# Patient Record
Sex: Male | Born: 1945
Health system: Southern US, Community
[De-identification: ages and names within clinical notes are randomized; demographics above are authoritative.]

## PROBLEM LIST (undated history)

## (undated) DIAGNOSIS — N529 Male erectile dysfunction, unspecified: Secondary | ICD-10-CM

## (undated) DIAGNOSIS — D472 Monoclonal gammopathy: Secondary | ICD-10-CM

## (undated) DIAGNOSIS — R7301 Impaired fasting glucose: Secondary | ICD-10-CM

## (undated) DIAGNOSIS — K219 Gastro-esophageal reflux disease without esophagitis: Secondary | ICD-10-CM

## (undated) DIAGNOSIS — G629 Polyneuropathy, unspecified: Secondary | ICD-10-CM

## (undated) DIAGNOSIS — D539 Nutritional anemia, unspecified: Secondary | ICD-10-CM

## (undated) DIAGNOSIS — E669 Obesity, unspecified: Secondary | ICD-10-CM

## (undated) DIAGNOSIS — I2510 Atherosclerotic heart disease of native coronary artery without angina pectoris: Secondary | ICD-10-CM

## (undated) DIAGNOSIS — G63 Polyneuropathy in diseases classified elsewhere: Secondary | ICD-10-CM

## (undated) DIAGNOSIS — M4800 Spinal stenosis, site unspecified: Secondary | ICD-10-CM

## (undated) DIAGNOSIS — E781 Pure hyperglyceridemia: Secondary | ICD-10-CM

## (undated) DIAGNOSIS — H269 Unspecified cataract: Secondary | ICD-10-CM

## (undated) DIAGNOSIS — H409 Unspecified glaucoma: Secondary | ICD-10-CM

## (undated) DIAGNOSIS — E041 Nontoxic single thyroid nodule: Secondary | ICD-10-CM

## (undated) DIAGNOSIS — M1990 Unspecified osteoarthritis, unspecified site: Secondary | ICD-10-CM

## (undated) DIAGNOSIS — G2581 Restless legs syndrome: Secondary | ICD-10-CM

## (undated) HISTORY — DX: Polyneuropathy in diseases classified elsewhere: G63

## (undated) HISTORY — DX: Male erectile dysfunction, unspecified: N52.9

## (undated) HISTORY — DX: Spinal stenosis, site unspecified: M48.00

## (undated) HISTORY — DX: Nontoxic single thyroid nodule: E04.1

## (undated) HISTORY — DX: Unspecified glaucoma: H40.9

## (undated) HISTORY — DX: Nutritional anemia, unspecified: D53.9

## (undated) HISTORY — PX: CARDIAC CATHETERIZATION: SHX172

## (undated) HISTORY — PX: COLON SURGERY: SHX602

## (undated) HISTORY — DX: Restless legs syndrome: G25.81

## (undated) HISTORY — DX: Obesity, unspecified: E66.9

## (undated) HISTORY — PX: SMALL INTESTINE SURGERY: SHX150

## (undated) HISTORY — PX: KNEE SURGERY: SHX244

## (undated) HISTORY — DX: Polyneuropathy, unspecified: G62.9

## (undated) HISTORY — DX: Monoclonal gammopathy: D47.2

## (undated) HISTORY — DX: Unspecified cataract: H26.9

## (undated) HISTORY — DX: Unspecified osteoarthritis, unspecified site: M19.90

## (undated) HISTORY — DX: Pure hyperglyceridemia: E78.1

## (undated) HISTORY — DX: Gastro-esophageal reflux disease without esophagitis: K21.9

## (undated) HISTORY — DX: Impaired fasting glucose: R73.01

## (undated) HISTORY — PX: COLONOSCOPY: SHX174

---

## 2002-07-10 ENCOUNTER — Encounter: Payer: Self-pay | Admitting: Internal Medicine

## 2002-07-10 ENCOUNTER — Ambulatory Visit (HOSPITAL_COMMUNITY): Admission: RE | Admit: 2002-07-10 | Discharge: 2002-07-10 | Payer: Self-pay | Admitting: Gastroenterology

## 2002-07-10 ENCOUNTER — Encounter (INDEPENDENT_AMBULATORY_CARE_PROVIDER_SITE_OTHER): Payer: Self-pay

## 2002-07-21 ENCOUNTER — Encounter: Admission: RE | Admit: 2002-07-21 | Discharge: 2002-07-21 | Payer: Self-pay | Admitting: Internal Medicine

## 2002-07-21 ENCOUNTER — Encounter: Payer: Self-pay | Admitting: Internal Medicine

## 2006-10-07 ENCOUNTER — Encounter: Admission: RE | Admit: 2006-10-07 | Discharge: 2006-10-07 | Payer: Self-pay | Admitting: Internal Medicine

## 2006-10-18 ENCOUNTER — Encounter: Admission: RE | Admit: 2006-10-18 | Discharge: 2006-10-18 | Payer: Self-pay | Admitting: Internal Medicine

## 2006-10-21 ENCOUNTER — Encounter (INDEPENDENT_AMBULATORY_CARE_PROVIDER_SITE_OTHER): Payer: Self-pay | Admitting: Interventional Radiology

## 2006-10-21 ENCOUNTER — Encounter: Admission: RE | Admit: 2006-10-21 | Discharge: 2006-10-21 | Payer: Self-pay | Admitting: Internal Medicine

## 2006-10-21 ENCOUNTER — Other Ambulatory Visit: Admission: RE | Admit: 2006-10-21 | Discharge: 2006-10-21 | Payer: Self-pay | Admitting: Interventional Radiology

## 2007-07-05 ENCOUNTER — Encounter: Admission: RE | Admit: 2007-07-05 | Discharge: 2007-07-05 | Payer: Self-pay | Admitting: Internal Medicine

## 2008-07-12 ENCOUNTER — Encounter: Admission: RE | Admit: 2008-07-12 | Discharge: 2008-07-12 | Payer: Self-pay | Admitting: Internal Medicine

## 2009-01-11 ENCOUNTER — Encounter: Admission: RE | Admit: 2009-01-11 | Discharge: 2009-01-11 | Payer: Self-pay | Admitting: Internal Medicine

## 2009-08-14 ENCOUNTER — Encounter (INDEPENDENT_AMBULATORY_CARE_PROVIDER_SITE_OTHER): Payer: Self-pay | Admitting: *Deleted

## 2009-08-14 ENCOUNTER — Encounter: Payer: Self-pay | Admitting: Internal Medicine

## 2009-08-20 ENCOUNTER — Encounter: Admission: RE | Admit: 2009-08-20 | Discharge: 2009-08-20 | Payer: Self-pay | Admitting: Family Medicine

## 2009-09-12 ENCOUNTER — Ambulatory Visit: Payer: Self-pay | Admitting: Internal Medicine

## 2010-07-01 NOTE — Miscellaneous (Signed)
Summary: anusol rx.  Clinical Lists Changes  Medications: Added new medication of ANUSOL-HC 25 MG  SUPP (HYDROCORTISONE ACETATE) insert 1 supp. every night. - Signed Rx of ANUSOL-HC 25 MG  SUPP (HYDROCORTISONE ACETATE) insert 1 supp. every night.;  #12 x 0;  Signed;  Entered by: Darlyn Read RN;  Authorized by: Hart Carwin MD;  Method used: Electronically to [facility]*, [address] [place], [place]  [address], Ph: [phone] or [phone], Fax: [phone]    Prescriptions: ANUSOL-HC 25 MG  SUPP (HYDROCORTISONE ACETATE) insert 1 supp. every night.  #12 x 0   Entered by:   Darlyn Read RN   Authorized by:   Hart Carwin MD   Signed by:   Darlyn Read RN on 09/12/2009   Method used:   Electronically to        [facility]* (retail)       [address] [place], [place]  [address]       Ph: [phone] or [phone]       Fax: [phone]   RxID:   [id number]

## 2010-07-01 NOTE — Letter (Signed)
Summary: [facility]   Imported By: Lanelle Bal 09/17/2009 10:42:07  _____________________________________________________________________  External Attachment:    Type:   Image     Comment:   External Document

## 2010-07-01 NOTE — Letter (Signed)
Summary: Previsit letter  [facility]  [address] [place], [place] [address]   Phone: [phone]  Fax: [phone]       08/14/2009 MRN: [id number]  Daniel Whitaker [address] [place], [place]  [address]  Dear [name],  Welcome to the Gastroenterology Division at [facility].    You are scheduled to see a nurse for your pre-procedure visit on 08/26/2009 at 8:30AM on the 3rd floor at [facility], [address]  We ask that you try to arrive at our office 15 minutes prior to your appointment time to allow for check-in.  Your nurse visit will consist of discussing your medical and surgical history, your immediate family medical history, and your medications.    Please bring a complete list of all your medications or, if you prefer, bring the medication bottles and we will list them.  We will need to be aware of both prescribed and over the counter drugs.  We will need to know exact dosage information as well.  If you are on blood thinners (Coumadin, Plavix, Aggrenox, Ticlid, etc.) please call our office today/prior to your appointment, as we need to consult with your physician about holding your medication.   Please be prepared to read and sign documents such as consent forms, a financial agreement, and acknowledgement forms.  If necessary, and with your consent, a friend or relative is welcome to sit-in on the nurse visit with you.  Please bring your insurance card so that we may make a copy of it.  If your insurance requires a referral to see a specialist, please bring your referral form from your primary care physician.  No co-pay is required for this nurse visit.     If you cannot keep your appointment, please call [phone] to cancel or reschedule prior to your appointment date.  This allows [place] the opportunity to schedule an appointment for another patient in need of care.    Thank you for choosing [facility] for your  medical needs.  We appreciate the opportunity to care for you.  Please visit [place] at our website  to learn more about our practice.                     Sincerely.                                                                                                                   The Gastroenterology Division

## 2010-07-01 NOTE — Procedures (Signed)
Summary: Colonoscopy  Patient: [name] Note: All result statuses are Final unless otherwise noted.  Tests: (1) Colonoscopy (COL)   COL Colonoscopy           DONE (C)     [facility]     [address]     [place], [place]  [address]           COLONOSCOPY PROCEDURE REPORT           PATIENT:  Daniel Whitaker, Daniel Whitaker  MR#:  [id number]     BIRTHDATE:  August 15, 1945, 63 yrs. old  GENDER:  male     ENDOSCOPIST:  Hedwig Morton. Juanda Chance, MD     REF. BY:  [name], M.D.     PROCEDURE DATE:  09/12/2009     PROCEDURE:  Colonoscopy 29562     ASA CLASS:  Class I     INDICATIONS:  Routine Risk Screening colon in 2004 hyperplastic     polyp     MEDICATIONS:   Fentanyl 75 mcg, Versed 6 mg IV           DESCRIPTION OF PROCEDURE:   After the risks benefits and     alternatives of the procedure were thoroughly explained, informed     consent was obtained.  Digital rectal exam was performed and     revealed no rectal masses.   The LB CF-[id number] endoscope     was introduced through the anus and advanced to the cecum, which     was identified by both the appendix and ileocecal valve, without     limitations.  The quality of the prep was good, using MiraLax.     The instrument was then slowly withdrawn as the colon was fully     examined.     <<PROCEDUREIMAGES>>           FINDINGS:  Three polyps were found in the sigmoid colon. 3     diminutive polyps remover from 20 and 60 cm The polyps were     removed using cold biopsy forceps.  This was otherwise a normal     examination of the colon (see image3, image2, and image1).     Internal hemorrhoids were found (see image5, image7, and image6).     Retroflexed views in the rectum revealed no abnormalities.    The     scope was then withdrawn from the patient and the procedure     completed.           COMPLICATIONS:  None     ENDOSCOPIC IMPRESSION:     1) Three polyps in the sigmoid colon     2) Otherwise normal examination     3) Internal  hemorrhoids     RECOMMENDATIONS:     1) Await pathology results     Anusol HC supp, #12, insert 1 qhs     REPEAT EXAM:  In 7 year(s) for.           ______________________________     Hedwig Morton. Juanda Chance, MD           CC:           n.     REVISED:  09/12/2009 02:42 PM     eSIGNED:   Hedwig Morton. Leibish Mcgregor at 09/12/2009 02:42 PM           [name], [name], [id number]  Note: An exclamation mark (!) indicates a result that was not dispersed into the flowsheet. Document Creation Date:  09/12/2009 2:43 PM _______________________________________________________________________  (1) Order result status: Final Collection or observation date-time: 09/12/2009 10:57 Requested date-time:  Receipt date-time:  Reported date-time:  Referring Physician:   Ordering Physician: Lina Sar [phone]) Specimen Source:  Source: Launa Grill Order Number: [id number] Lab site:   Appended Document: Colonoscopy recall colon 10 years  Appended Document: Colonoscopy     Procedures Next Due Date:    Colonoscopy: 08/2019

## 2010-07-01 NOTE — Procedures (Signed)
Summary: Colonoscopy/[facility]   Imported By: Lanelle Bal 09/17/2009 10:40:03  _____________________________________________________________________  External Attachment:    Type:   Image     Comment:   External Document

## 2010-10-17 NOTE — Op Note (Signed)
   NAME:  Daniel Whitaker, Daniel Whitaker NO.:  [id number]   MEDICAL RECORD NO.:  [id number]                   PATIENT TYPE:  AMB   LOCATION:  ENDO                                 FACILITY[facility]  [facility]   PHYSICIAN:  Danise Edge, M.D.                DATE OF BIRTH:  22-Feb-1946   DATE OF PROCEDURE:  07/10/2002  DATE OF DISCHARGE:                                 OPERATIVE REPORT   PROCEDURE:  Colonoscopy with polypectomy.   INDICATIONS:  The patient is a 65 year old male who underwent a health  maintenance flexible proctosigmoidoscopy.  A polyp was discovered in the  rectum at 10 cm from the anal verge and in the sigmoid colon at 35 cm from  the anal verge.   ENDOSCOPIST:  Danise Edge, M.D.   PREMEDICATION:  Versed 7.5 mg, Demerol 100 mg.   ENDOSCOPE:  Olympus adult colonoscope.   DESCRIPTION OF PROCEDURE:  After obtaining informed consent, the patient was  placed in the left lateral decubitus position.  I administered intravenous  Demerol and intravenous Versed to achieve conscious sedation for the  procedure.  The patient's blood pressure, oxygen saturation, and cardiac  rhythm were documented throughout the procedure and documented in the  medical record.   Anal inspection was normal.  Digital rectal exam revealed a non-nodular  prostate.  The Olympus colonoscope was introduced into the rectum and  advanced to the cecum.  Colonic preparation for the exam today was  excellent.   Rectum:  From the distal rectum at 10 cm from the anal verge, a 2 mm sessile  polyp was removed with the electrocautery snare.   Sigmoid colon and descending colon:  Close inspection of the left colonic  mucosa reveals no identifiable colonic neoplasia.   Splenic flexure normal.   Transverse colon normal.   Hepatic flexure normal.   Ascending colon normal.   Cecum and ileocecal valve normal.    ASSESSMENT:  A small rectal polyp was removed with the electrocautery snare;  otherwise normal proctocolonoscopy to the cecum.   RECOMMENDATIONS:  Repeat colonoscopy in five years.                                               Danise Edge, M.D.    MJ/MEDQ  D:  07/10/2002  T:  07/10/2002  Job:  [id number]   cc:   Georgann Housekeeper, M.D.  [address]  [place]  [place] [address]  Fax: [phone]

## 2010-11-11 ENCOUNTER — Other Ambulatory Visit: Payer: Self-pay | Admitting: Family Medicine

## 2010-11-11 DIAGNOSIS — E041 Nontoxic single thyroid nodule: Secondary | ICD-10-CM

## 2010-11-17 ENCOUNTER — Ambulatory Visit
Admission: RE | Admit: 2010-11-17 | Discharge: 2010-11-17 | Disposition: A | Discharge status: Home / Self Care | Payer: 59 | Source: Ambulatory Visit | Attending: Family Medicine | Admitting: Family Medicine

## 2010-11-17 DIAGNOSIS — E041 Nontoxic single thyroid nodule: Secondary | ICD-10-CM

## 2011-07-27 ENCOUNTER — Other Ambulatory Visit: Payer: Self-pay | Admitting: Family Medicine

## 2011-07-27 DIAGNOSIS — M542 Cervicalgia: Secondary | ICD-10-CM

## 2011-07-28 ENCOUNTER — Ambulatory Visit
Admission: RE | Admit: 2011-07-28 | Discharge: 2011-07-28 | Disposition: A | Discharge status: Home / Self Care | Payer: Medicare Other | Source: Ambulatory Visit | Attending: Family Medicine | Admitting: Family Medicine

## 2011-07-28 DIAGNOSIS — M542 Cervicalgia: Secondary | ICD-10-CM

## 2012-06-22 ENCOUNTER — Other Ambulatory Visit: Payer: Self-pay | Admitting: Dermatology

## 2012-12-06 DIAGNOSIS — M47812 Spondylosis without myelopathy or radiculopathy, cervical region: Secondary | ICD-10-CM | POA: Insufficient documentation

## 2013-04-06 ENCOUNTER — Encounter: Payer: Self-pay | Admitting: Neurology

## 2013-04-06 ENCOUNTER — Ambulatory Visit (INDEPENDENT_AMBULATORY_CARE_PROVIDER_SITE_OTHER): Payer: Medicare Other | Admitting: Neurology

## 2013-04-06 VITALS — BP 131/86 | HR 70 | Ht 73.75 in | Wt 249.0 lb

## 2013-04-06 DIAGNOSIS — G609 Hereditary and idiopathic neuropathy, unspecified: Secondary | ICD-10-CM | POA: Insufficient documentation

## 2013-04-06 NOTE — Patient Instructions (Signed)
Overall you are doing fairly well but I do want to suggest a few things today:   As far as your medications are concerned, I would like to suggest increasing the lyrica to 125mg  twice a day. We gave you free samples to try this dose.   As far as diagnostic testing:  1)Blood work today 2)EMG/NCS  I would like to see you back in 4 months, sooner if we need to. Please call us with any interim questions, concerns, problems, updates or refill requests.   Please also call us for any test results so we can go over those with you on the phone.  My clinical assistant and will answer any of your questions and relay your messages to me and also relay most of my messages to you.   Our phone number is [phone]. We also have an after hours call service for urgent matters and there is a physician on-call for urgent questions. For any emergencies you know to call 911 or go to the nearest emergency room

## 2013-04-06 NOTE — Progress Notes (Signed)
[facility]    Provider:  Dr Hosie Poisson Referring Provider: [name], MD Primary Care Physician:  [name], MD  CC:  neuropathy  HPI:  Daniel Handy Sr. is a 67 y.o. male here as a referral from Dr. Clelia Croft for suspected peripheral neuropathy  Started 2 months ago, starting slowly and getting a more severe. Described as a burning type pain, on soles of feet, is constant pain, hurts more with his feet off when bare footed, his feet will occasionaly spasm. Not worse at night or when anything brushing againt his feet. No loss of strength in his feet. No ascending sensory changes. Has history fo RLS. The burning does not get better with movement. Notes episodes of lumbar radicular type pain in the past, this is occurring years ago. Reports having had EMG nerve conduction study in the past, thinks was around 4-5 years ago unsure what it showed but states it was abnormal.  Overall healthy, no DM. Prescribed Lyrica 75 mg twice a day by his primary care physician, initially noted benefit but has not noted any sustained benefit. Tolerating well no excessive fatigue.  Reviewed notes, labs and imaging from outside physicians, which showed normal TSH, normal hemoglobin A1c borderline low B12 of 304.  Review of Systems: Out of a complete 14 system review, the patient complains of only the following symptoms, and all other reviewed systems are negative. Positive for hearing loss ringing in ears numbness restless legs  History   Social History  . Marital Status: Married    Spouse Name: Bonita Quin    Number of Children: 2  . Years of Education: GED   Occupational History  . Not on file.   Social History Main Topics  . Smoking status: Former Games developer  . Smokeless tobacco: Never Used  . Alcohol Use: 0.0 oz/week     Comment: 12 BEERS WEEKLY  . Drug Use: No  . Sexual Activity: Not on file   Other Topics Concern  . Not on file   Social History Narrative   Patient lives  at home with his wife Bonita Quin)   Patient works full-time.   Patient has two children.   Patient is right-handed.   Patient has his GED.   Patient does not drink caffeine.    Family History  Problem Relation Age of Onset  . Brain cancer Mother   . Lung cancer Father     Past Medical History  Diagnosis Date  . Nontoxic uninodular goiter   . Peripheral neuropathy   . Esophageal reflux   . Erectile dysfunction   . Obesity   . Glaucoma   . Impaired fasting glucose   . Hypertriglyceridemia     Past Surgical History  Procedure Laterality Date  . Knee surgery      Current Outpatient Prescriptions  Medication Sig Dispense Refill  . latanoprost (XALATAN) 0.005 % ophthalmic solution       . meloxicam (MOBIC) 15 MG tablet       . multivitamin-iron-minerals-folic acid (CENTRUM) chewable tablet Chew 1 tablet by mouth daily.      . nabumetone (RELAFEN) 500 MG tablet       . Omega-3 Fatty Acids (FISH OIL) 1000 MG CAPS Take 1,000 mg by mouth daily. 3      . pantoprazole (PROTONIX) 40 MG tablet       . pregabalin (LYRICA) 75 MG capsule Take 75 mg by mouth 2 (two) times daily.      . tadalafil (CIALIS) 5 MG tablet  Take 20 mg by mouth daily as needed for erectile dysfunction.      . timolol (TIMOPTIC-XR) 0.5 % ophthalmic gel-forming        No current facility-administered medications for this visit.    Allergies as of 04/06/2013 - Review Complete 04/06/2013  Allergen Reaction Noted  . Penicillins  04/06/2013    Vitals: BP 131/86  Pulse 70  Ht 6' 1.75" (1.873 m)  Wt 249 lb (112.946 kg)  BMI 32.20 kg/m2 Last Weight:  Wt Readings from Last 1 Encounters:  04/06/13 249 lb (112.946 kg)   Last Height:   Ht Readings from Last 1 Encounters:  04/06/13 6' 1.75" (1.873 m)     Physical exam: Exam: Gen: NAD, conversant Eyes: anicteric sclerae, moist conjunctivae HENT: Atraumatic, oropharynx clear Neck: Trachea midline; supple,  Lungs: CTA, no wheezing, rales, rhonic                           CV: RRR, no MRG Abdomen: Soft, non-tender;  Extremities: No peripheral edema  Skin: Normal temperature, no rash,  Psych: Appropriate affect, pleasant  Neuro: MS: AA&Ox3, appropriately interactive, normal affect   Speech: fluent w/o paraphasic error  Memory: good recent and remote recall  CN: PERRL, EOMI no nystagmus, no ptosis, sensation intact to LT V1-V3 bilat, face symmetric, no weakness, hearing grossly intact, palate elevates symmetrically, shoulder shrug 5/5 bilat,  tongue protrudes midline, no fasiculations noted.  Motor: normal bulk and tone Strength: 5/5  In all extremities, limited flexion ROM L great toe  Coord: rapid alternating and point-to-point (FNF, HTS) movements intact.  Reflexes: symmetrical, bilat downgoing toes  Sens: LT intact in all extremities, decreased temp, vibration, PP, proprioception bilat LE L>R  Gait: posture, stance, stride and arm-swing normal.   Assessment:  After physical and neurologic examination, review of laboratory studies, imaging, neurophysiology testing and pre-existing records, assessment will be reviewed on the problem list.  Plan:  Treatment plan and additional workup will be reviewed under Problem List.  1)peripheral neuropathy  66y/o gentleman presenting for initial evaluation of symptoms concerning for a peripheral neuropathy. Currently taking Lyrica 75mg  bid with minimal benefit. Lab workup by PCP unremarkable with exception of borderline low B12 of 304. Will check MMA, SPEP, EMG/NCS. Will increase Lyrica to 125mg  BID, 1 month supply given. Follow up once workup completed.

## 2013-04-11 ENCOUNTER — Telehealth: Payer: Self-pay | Admitting: Neurology

## 2013-04-11 LAB — PROTEIN ELECTROPHORESIS, SERUM
A/G Ratio: 1.5 (ref 0.7–2.0)
Albumin ELP: 4.1 g/dL (ref 3.2–5.6)
Alpha 1: 0.2 g/dL (ref 0.1–0.4)
Alpha 2: 0.6 g/dL (ref 0.4–1.2)
Beta: 0.9 g/dL (ref 0.6–1.3)
Total Protein: 6.9 g/dL (ref 6.0–8.5)

## 2013-04-11 LAB — METHYLMALONIC ACID, SERUM: Methylmalonic Acid: 271 nmol/L (ref 0–378)

## 2013-04-11 NOTE — Telephone Encounter (Signed)
Called patient to discuss his lab findings. Found to have a MGUS. Explained to patient that he may need to follow up with hematology but will defer to primary care. Lab work sent to Dr Clelia Croft. Patient scheduled to follow up with her.

## 2013-04-15 ENCOUNTER — Inpatient Hospital Stay (HOSPITAL_COMMUNITY): Payer: Medicare Other

## 2013-04-15 ENCOUNTER — Encounter (HOSPITAL_COMMUNITY): Payer: Self-pay | Admitting: Emergency Medicine

## 2013-04-15 ENCOUNTER — Inpatient Hospital Stay (HOSPITAL_COMMUNITY)
Admission: EM | Admit: 2013-04-15 | Discharge: 2013-04-25 | DRG: 337 | Disposition: A | Discharge status: Home / Self Care | Payer: Medicare Other | Attending: General Surgery | Admitting: General Surgery

## 2013-04-15 ENCOUNTER — Emergency Department (HOSPITAL_COMMUNITY): Payer: Medicare Other

## 2013-04-15 DIAGNOSIS — R7309 Other abnormal glucose: Secondary | ICD-10-CM | POA: Diagnosis present

## 2013-04-15 DIAGNOSIS — G629 Polyneuropathy, unspecified: Secondary | ICD-10-CM

## 2013-04-15 DIAGNOSIS — Z87891 Personal history of nicotine dependence: Secondary | ICD-10-CM

## 2013-04-15 DIAGNOSIS — Z6830 Body mass index (BMI) 30.0-30.9, adult: Secondary | ICD-10-CM

## 2013-04-15 DIAGNOSIS — D472 Monoclonal gammopathy: Secondary | ICD-10-CM | POA: Diagnosis present

## 2013-04-15 DIAGNOSIS — E669 Obesity, unspecified: Secondary | ICD-10-CM | POA: Diagnosis present

## 2013-04-15 DIAGNOSIS — H409 Unspecified glaucoma: Secondary | ICD-10-CM | POA: Diagnosis present

## 2013-04-15 DIAGNOSIS — D649 Anemia, unspecified: Secondary | ICD-10-CM | POA: Diagnosis present

## 2013-04-15 DIAGNOSIS — R739 Hyperglycemia, unspecified: Secondary | ICD-10-CM

## 2013-04-15 DIAGNOSIS — E876 Hypokalemia: Secondary | ICD-10-CM | POA: Diagnosis not present

## 2013-04-15 DIAGNOSIS — G609 Hereditary and idiopathic neuropathy, unspecified: Secondary | ICD-10-CM | POA: Diagnosis present

## 2013-04-15 DIAGNOSIS — K219 Gastro-esophageal reflux disease without esophagitis: Secondary | ICD-10-CM | POA: Diagnosis present

## 2013-04-15 DIAGNOSIS — R1110 Vomiting, unspecified: Secondary | ICD-10-CM

## 2013-04-15 DIAGNOSIS — K56609 Unspecified intestinal obstruction, unspecified as to partial versus complete obstruction: Secondary | ICD-10-CM

## 2013-04-15 DIAGNOSIS — K565 Intestinal adhesions [bands], unspecified as to partial versus complete obstruction: Secondary | ICD-10-CM

## 2013-04-15 DIAGNOSIS — M47812 Spondylosis without myelopathy or radiculopathy, cervical region: Secondary | ICD-10-CM | POA: Diagnosis present

## 2013-04-15 DIAGNOSIS — R109 Unspecified abdominal pain: Secondary | ICD-10-CM

## 2013-04-15 HISTORY — DX: Monoclonal gammopathy: D47.2

## 2013-04-15 LAB — URINALYSIS, ROUTINE W REFLEX MICROSCOPIC
Leukocytes, UA: NEGATIVE
Protein, ur: NEGATIVE mg/dL
Urobilinogen, UA: 1 mg/dL (ref 0.0–1.0)

## 2013-04-15 LAB — COMPREHENSIVE METABOLIC PANEL
Alkaline Phosphatase: 71 U/L (ref 39–117)
BUN: 28 mg/dL — ABNORMAL HIGH (ref 6–23)
CO2: 22 mEq/L (ref 19–32)
Calcium: 9.4 mg/dL (ref 8.4–10.5)
Chloride: 102 mEq/L (ref 96–112)
Creatinine, Ser: 0.9 mg/dL (ref 0.50–1.35)
GFR calc Af Amer: >90 mL/min (ref >90)
GFR calc non Af Amer: 87 mL/min — ABNORMAL LOW (ref >90)
Glucose, Bld: 131 mg/dL — ABNORMAL HIGH (ref 70–99)
Potassium: 3.2 mEq/L — ABNORMAL LOW (ref 3.5–5.1)

## 2013-04-15 LAB — CBC WITH DIFFERENTIAL/PLATELET
Basophils Absolute: 0 10*3/uL (ref 0.0–0.1)
Eosinophils Relative: 1 % (ref 0–5)
HCT: 42.2 % (ref 39.0–52.0)
Hemoglobin: 15.2 g/dL (ref 13.0–17.0)
Lymphocytes Relative: 18 % (ref 12–46)
Lymphs Abs: 1.4 10*3/uL (ref 0.7–4.0)
MCV: 87.9 fL (ref 78.0–100.0)
Monocytes Absolute: 0.9 10*3/uL (ref 0.1–1.0)
Monocytes Relative: 12 % (ref 3–12)
Neutro Abs: 5.1 10*3/uL (ref 1.7–7.7)
Platelets: 227 10*3/uL (ref 150–400)
RDW: 12.5 % (ref 11.5–15.5)
WBC: 7.4 10*3/uL (ref 4.0–10.5)

## 2013-04-15 LAB — GLUCOSE, CAPILLARY: Glucose-Capillary: 96 mg/dL (ref 70–99)

## 2013-04-15 LAB — LIPASE, BLOOD: Lipase: 48 U/L (ref 11–59)

## 2013-04-15 MED ORDER — INSULIN ASPART 100 UNIT/ML ~~LOC~~ SOLN: 0.0000 [IU] | SUBCUTANEOUS | Status: DC

## 2013-04-15 MED ORDER — ONDANSETRON HCL 4 MG PO TABS
4.0000 mg | ORAL_TABLET | Freq: Four times a day (QID) | ORAL | Status: DC | PRN
  Administered 2013-04-19: 4 mg via ORAL
  Filled 2013-04-15: qty 1

## 2013-04-15 MED ORDER — SODIUM CHLORIDE 0.9 % IV SOLN
1000.0000 mL | Freq: Once | INTRAVENOUS | Status: AC
  Administered 2013-04-15: 1000 mL via INTRAVENOUS

## 2013-04-15 MED ORDER — MORPHINE SULFATE 2 MG/ML IJ SOLN
2.0000 mg | INTRAMUSCULAR | Status: DC | PRN
  Administered 2013-04-15: 2 mg via INTRAVENOUS
  Filled 2013-04-15: qty 1

## 2013-04-15 MED ORDER — POTASSIUM CHLORIDE IN NACL 40-0.9 MEQ/L-% IV SOLN
INTRAVENOUS | Status: DC
  Administered 2013-04-15 – 2013-04-16 (×3): via INTRAVENOUS
  Filled 2013-04-15 (×5): qty 1000

## 2013-04-15 MED ORDER — ENOXAPARIN SODIUM 60 MG/0.6ML ~~LOC~~ SOLN
60.0000 mg | Freq: Every day | SUBCUTANEOUS | Status: DC
  Administered 2013-04-15: 60 mg via SUBCUTANEOUS
  Filled 2013-04-15 (×3): qty 0.6

## 2013-04-15 MED ORDER — IOHEXOL 300 MG/ML  SOLN
50.0000 mL | Freq: Once | INTRAMUSCULAR | Status: AC | PRN
  Administered 2013-04-15: 50 mL via ORAL

## 2013-04-15 MED ORDER — ONDANSETRON HCL 4 MG/2ML IJ SOLN
4.0000 mg | Freq: Once | INTRAMUSCULAR | Status: AC
  Administered 2013-04-15: 4 mg via INTRAVENOUS
  Filled 2013-04-15: qty 2

## 2013-04-15 MED ORDER — SODIUM CHLORIDE 0.9 % IV BOLUS (SEPSIS)
1000.0000 mL | Freq: Once | INTRAVENOUS | Status: AC
  Administered 2013-04-15: 1000 mL via INTRAVENOUS

## 2013-04-15 MED ORDER — HYDROMORPHONE HCL PF 1 MG/ML IJ SOLN: 1.0000 mg | INTRAMUSCULAR | Status: AC | PRN

## 2013-04-15 MED ORDER — HYDROMORPHONE HCL PF 1 MG/ML IJ SOLN
1.0000 mg | Freq: Once | INTRAMUSCULAR | Status: AC
  Administered 2013-04-15: 1 mg via INTRAVENOUS
  Filled 2013-04-15: qty 1

## 2013-04-15 MED ORDER — SODIUM CHLORIDE 0.9 % IV SOLN: INTRAVENOUS | Status: AC

## 2013-04-15 MED ORDER — PANTOPRAZOLE SODIUM 40 MG PO TBEC
40.0000 mg | DELAYED_RELEASE_TABLET | Freq: Every day | ORAL | Status: DC
  Administered 2013-04-16 – 2013-04-20 (×5): 40 mg via ORAL
  Filled 2013-04-15 (×8): qty 1

## 2013-04-15 MED ORDER — ONDANSETRON HCL 4 MG/2ML IJ SOLN
4.0000 mg | Freq: Four times a day (QID) | INTRAMUSCULAR | Status: DC | PRN
  Administered 2013-04-19: 4 mg via INTRAVENOUS
  Filled 2013-04-15 (×2): qty 2

## 2013-04-15 MED ORDER — TIMOLOL MALEATE 0.5 % OP SOLG
1.0000 [drp] | Freq: Every day | OPHTHALMIC | Status: DC
  Filled 2013-04-15: qty 5

## 2013-04-15 MED ORDER — LATANOPROST 0.005 % OP SOLN
1.0000 [drp] | Freq: Every day | OPHTHALMIC | Status: DC
  Administered 2013-04-15 – 2013-04-24 (×10): 1 [drp] via OPHTHALMIC
  Filled 2013-04-15: qty 2.5

## 2013-04-15 MED ORDER — PREGABALIN 75 MG PO CAPS: 75.0000 mg | ORAL_CAPSULE | Freq: Two times a day (BID) | ORAL | Status: DC

## 2013-04-15 MED ORDER — ONDANSETRON HCL 4 MG/2ML IJ SOLN: 4.0000 mg | Freq: Three times a day (TID) | INTRAMUSCULAR | Status: AC | PRN

## 2013-04-15 MED ORDER — PREGABALIN 75 MG PO CAPS
125.0000 mg | ORAL_CAPSULE | Freq: Two times a day (BID) | ORAL | Status: DC
  Administered 2013-04-15 – 2013-04-20 (×10): 125 mg via ORAL
  Filled 2013-04-15 (×20): qty 1

## 2013-04-15 MED ORDER — LIDOCAINE HCL 2 % EX GEL
Freq: Once | CUTANEOUS | Status: AC
  Administered 2013-04-15: 18:00:00
  Filled 2013-04-15: qty 10

## 2013-04-15 MED ORDER — TIMOLOL MALEATE 0.5 % OP SOLG
1.0000 [drp] | Freq: Every day | OPHTHALMIC | Status: DC
  Administered 2013-04-16 – 2013-04-24 (×8): 1 [drp] via OPHTHALMIC
  Filled 2013-04-15: qty 5

## 2013-04-15 MED ORDER — IOHEXOL 300 MG/ML  SOLN
100.0000 mL | Freq: Once | INTRAMUSCULAR | Status: AC | PRN
  Administered 2013-04-15: 100 mL via INTRAVENOUS

## 2013-04-15 MED ORDER — ENOXAPARIN SODIUM 40 MG/0.4ML ~~LOC~~ SOLN: 40.0000 mg | SUBCUTANEOUS | Status: DC

## 2013-04-15 NOTE — Consult Note (Signed)
Re:   Daniel ELVIN Sr. DOB:   10/07/1945 MRN:   [id number]  [facility] Consultation  ASSESSMENT AND PLAN: 1.  Small bowel obstruction vs ileus  The patient gives a good history of acute gastroenteritis.  I would expect the bowel dilatation to get better with bowel rest.  Will follow.  2.  Neuropathy of LE - saw Dr. Pricilla Handler for neurology 3.  Cervical DJD  Has seen Dr. Valera Castle in the past. 4. History of benign thyroid nodule.  Chief Complaint  Patient presents with  . Abdominal Pain  . Nausea  . Emesis   REFERRING PHYSICIAN: SHAW,KIMBERLEE, MD  HISTORY OF PRESENT ILLNESS: Daniel DONAGHEY Sr. is a 67 y.o. (DOB: 1946-02-16)  white  male whose primary care physician is SHAW,KIMBERLEE, MD and comes to the [facility] with abdominal pain and vomiting. Wife, Bonita Quin, and brother, Greggory Stallion, with patient.  The patient has no prior GI history.  He got sick 3 am on Wednesday, 04/12/2013, with both vomiting and diarrhea.  This lasted about 12 hours and he felt better.  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  He is in the [employer].  He took some noodle Friday, 11/14.  But he started having cramping at the end of the day.  He has had small amounts of loose stools, but developed mid abdominal pain and distention that was a little worse on the left side.  He went to [place] walk in clinic this AM.  They sent him to the [facility]. He had a colonoscopy by Dr. Diamond Nickel about 3 years ago which was negative.   He has no history of stomach disease.  No history of liver disease.  No history of gall bladder disease.  No history of pancreas disease.  No history of colon disease.  He has had no prior abdominal surgery.   Past Medical History  Diagnosis Date  . Nontoxic uninodular goiter   . Peripheral neuropathy   . Esophageal reflux   . Erectile dysfunction   . Obesity   . Glaucoma   . Impaired fasting glucose   . Hypertriglyceridemia       Past Surgical History  Procedure Laterality  Date  . Knee surgery        Current Facility-Administered Medications  Medication Dose Route Frequency Provider Last Rate Last Dose  . lidocaine (XYLOCAINE) 2 % jelly   Other Once Gerhard Munch, MD       Current Outpatient Prescriptions  Medication Sig Dispense Refill  . cetirizine (ZYRTEC) 10 MG tablet Take 10 mg by mouth daily.      Marland Kitchen ibuprofen (ADVIL,MOTRIN) 600 MG tablet Take 600 mg by mouth every 6 (six) hours as needed.      . latanoprost (XALATAN) 0.005 % ophthalmic solution       . meloxicam (MOBIC) 15 MG tablet       . multivitamin-iron-minerals-folic acid (CENTRUM) chewable tablet Chew 1 tablet by mouth daily.      . nabumetone (RELAFEN) 500 MG tablet       . Omega-3 Fatty Acids (FISH OIL) 1000 MG CAPS Take 1,000 mg by mouth daily. 3      . pantoprazole (PROTONIX) 40 MG tablet       . pregabalin (LYRICA) 75 MG capsule Take 75 mg by mouth 2 (two) times daily.      . promethazine (PHENERGAN) 25 MG tablet Take 25 mg by mouth every 6 (six) hours as needed for  nausea or vomiting.      . timolol (TIMOPTIC-XR) 0.5 % ophthalmic gel-forming           Allergies  Allergen Reactions  . Penicillins     REVIEW OF SYSTEMS: Skin:  No history of rash.  No history of abnormal moles. Infection:  No history of hepatitis or HIV.  No history of MRSA. Neurologic:  Idiopathic LE peripheral neuropathy - saw Dr. Pricilla Handler Cardiac:  No history of hypertension. No history of heart disease.  No history of prior cardiac catheterization.  No history of seeing a cardiologist. Pulmonary:  Quit smoking 1980's.  Endocrine:  No diabetes. No thyroid disease. Gastrointestinal:  See HPI. Urologic:  No history of kidney stones.  No history of bladder infections. Musculoskeletal:  No history of joint or back disease. Hematologic:  He said that Dr. Hosie Poisson mentioned something in his blood that may require further eval, but he does not know what it is. Psycho-social:  The patient is oriented.   SOCIAL  and FAMILY HISTORY: Married.  Wife, Bonita Quin, in the room. 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  PHYSICAL EXAM: BP 155/79  Pulse 88  Temp(Src) 98.3 F (36.8 C) (Oral)  Resp 16  SpO2 100%  General: WN WM who is alert and generally healthy appearing.  HEENT: Normal. Pupils equal. Neck: Supple. No mass.  I cannot feel a thyroid mass. Lymph Nodes:  No supraclavicular or cervical nodes. Lungs: Clear to auscultation and symmetric breath sounds. Heart:  RRR. No murmur or rub. Abdomen: Soft. No mass. No tenderness. No hernia.   No abdominal scars.  Though his abdomen is somewhat bloated, he has no peritoneal signs. Rectal: Not done. Extremities:  Good strength and ROM  in upper and lower extremities. Neurologic:  Grossly intact to motor and sensory function. Psychiatric: Has normal mood and affect. Behavior is normal.   DATA REVIEWED: Epic notes and labs.  Ovidio Kin, MD,  [facility] [address]  [address]   [place], [place]    [address] Phone:  [phone] FAX:  [phone]

## 2013-04-15 NOTE — Progress Notes (Signed)
Rx Brief Lovenox note Wt=112 kg CrCl>30 ml/min  BMI=32  Adjusted Lovenox to 60mg  daily (~0.5 mg/kg) in pt with BMI>30  Daniel Whitaker 04/15/2013 8:05 PM

## 2013-04-15 NOTE — H&P (Signed)
[facility] History and Physical  Daniel GAERTNER Sr. ZOX:[id number] DOB: [birth year] DOA: 04/15/2013  Referring physician: er PCP: [name], MD  Specialists: surgeryEzzard Standing  Chief Complaint: abd pain, N/V/D  HPI: Daniel Handy Sr. is a 67 y.o. male  In good health who on Wednesday, got sick with both vomiting and diarrhea. This lasted about 12 hours and he felt better. 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 He ate some food on Friday but he started having cramping at the end of the day. He has had small amounts of loose stools, but developed mid abdominal pain and distention that was a little worse on the left side. He went to [place] walk in clinic this AM. They sent him to the [facility] ER for possible SBO He has occasional alcohol use  In the ER, a CT scan was done that showed: Findings most consistent with small bowel obstruction at the distal ileum. No definite cause identified. Presumably secondary to adhesions. If there has not been prior surgery, alternative explanations such as otherwise occult internal hernia would be considerations Patient has not history of surgery.  Dr. Ezzard Standing saw patient and recommends NG tube, bowel rest, and flat and upright abd x ray in AM   Review of Systems: all systems reviewed, negative unless stated above   Past Medical History  Diagnosis Date  . Nontoxic uninodular goiter   . Peripheral neuropathy   . Esophageal reflux   . Erectile dysfunction   . Obesity   . Glaucoma   . Impaired fasting glucose   . Hypertriglyceridemia   . MGUS (monoclonal gammopathy of unknown significance)    Past Surgical History  Procedure Laterality Date  . Knee surgery     Social History:  reports that he has quit smoking. He has never used smokeless tobacco. He reports that he drinks alcohol. He reports that he does not use illicit drugs. -occasional alcohol use   Allergies  Allergen Reactions  . Penicillins     Family History   Problem Relation Age of Onset  . Brain cancer Mother   . Lung cancer Father     Prior to Admission medications   Medication Sig Start Date End Date Taking? Authorizing Provider  cetirizine (ZYRTEC) 10 MG tablet Take 10 mg by mouth daily.   Yes Historical Provider, MD  ibuprofen (ADVIL,MOTRIN) 600 MG tablet Take 600 mg by mouth every 6 (six) hours as needed.   Yes Historical Provider, MD  latanoprost (XALATAN) 0.005 % ophthalmic solution  03/01/13  Yes Historical Provider, MD  meloxicam (MOBIC) 15 MG tablet  04/03/13  Yes Historical Provider, MD  multivitamin-iron-minerals-folic acid (CENTRUM) chewable tablet Chew 1 tablet by mouth daily.   Yes Historical Provider, MD  nabumetone (RELAFEN) 500 MG tablet  03/09/13  Yes Historical Provider, MD  Omega-3 Fatty Acids (FISH OIL) 1000 MG CAPS Take 1,000 mg by mouth daily. 3   Yes Historical Provider, MD  pantoprazole (PROTONIX) 40 MG tablet  03/14/13  Yes Historical Provider, MD  pregabalin (LYRICA) 75 MG capsule Take 75 mg by mouth 2 (two) times daily.   Yes Historical Provider, MD  promethazine (PHENERGAN) 25 MG tablet Take 25 mg by mouth every 6 (six) hours as needed for nausea or vomiting.   Yes Historical Provider, MD  timolol (TIMOPTIC-XR) 0.5 % ophthalmic gel-forming  03/01/13  Yes Historical Provider, MD   Physical Exam: Filed Vitals:   04/15/13 1850  BP:   Pulse: 79  Temp:  Resp: 16     General:  A+Ox3, NAD, NG tube in place  Eyes: wnl  ENT: wnl  Neck: supple  Cardiovascular: rrr  Respiratory: clear. No wheezing  Abdomen: soft, NT, minimal BS  Skin: no rashes or lesions  Musculoskeletal: moves all 4 ext  Psychiatric: normal mood/affect  Neurologic: CN 2-12 intact  Labs on Admission:  Basic Metabolic Panel:  Recent Labs Lab 04/15/13 1425  NA 138  K 3.2*  CL 102  CO2 22  GLUCOSE 131*  BUN 28*  CREATININE 0.90  CALCIUM 9.4   Liver Function Tests:  Recent Labs Lab 04/15/13 1425  AST 22  ALT 31   ALKPHOS 71  BILITOT 0.8  PROT 7.7  ALBUMIN 4.0    Recent Labs Lab 04/15/13 1425  LIPASE 48   No results found for this basename: AMMONIA,  in the last 168 hours CBC:  Recent Labs Lab 04/15/13 1425  WBC 7.4  NEUTROABS 5.1  HGB 15.2  HCT 42.2  MCV 87.9  PLT 227   Cardiac Enzymes: No results found for this basename: CKTOTAL, CKMB, CKMBINDEX, TROPONINI,  in the last 168 hours  BNP (last 3 results) No results found for this basename: PROBNP,  in the last 8760 hours CBG: No results found for this basename: GLUCAP,  in the last 168 hours  Radiological Exams on Admission: Ct Abdomen Pelvis W Contrast  04/15/2013   CLINICAL DATA:  Possible small bowel obstruction. Nausea and vomiting.  EXAM: CT ABDOMEN AND PELVIS WITH CONTRAST  TECHNIQUE: Multidetector CT imaging of the abdomen and pelvis was performed using the standard protocol following bolus administration of intravenous contrast.  CONTRAST:  OMNIPAQUE IOHEXOL 300 MG/ML  SOLN  COMPARISON:  None.  FINDINGS: Lower Chest: Subsegmental atelectasis at the bases. Mild cardiomegaly, without pericardial or pleural effusion. Small hiatal hernia.  Abdomen/Pelvis: Normal liver, spleen, stomach. Descending duodenal diverticulum.  Normal pancreas, gallbladder, biliary tract, adrenal glands. Bilateral low-density renal lesions. Primarily too small to characterize. The larger lesions are consistent with cysts.  No retroperitoneal or retrocrural adenopathy. Aortic and branch vessel atherosclerosis.  The colon is normal in caliber.  Normal terminal ileum and appendix.  Moderate small bowel dilatation proximally at up to 4.5 cm. Fluid filled bowel loops continue to the level of a transition in the distal ileum. This is relatively gradual and identified on images 50 through 63 of series 2. No obstructive mass is seen. There is no bowel wall thickening. No pneumatosis or free intraperitoneal air. There is perienteric fluid within the upper pelvis  and cul-de-sac.  No pelvic adenopathy. Right-sided hydrocele incompletely imaged. Normal urinary bladder and prostate.  Bones/Musculoskeletal:  Congenitally short lumbar pedicles.  IMPRESSION: 1. Findings most consistent with small bowel obstruction at the distal ileum. No definite cause identified. Presumably secondary to adhesions. If there has not been prior surgery, alternative explanations such as otherwise occult internal hernia would be considerations 2. No small ball wall thickening to suggest complicating ischemia. There is nonspecific perienteric fluid within the upper pelvis and cul-de-sac. 3. Small hiatal hernia.   Electronically Signed   By: Jeronimo Greaves M.D.   On: 04/15/2013 16:41      Assessment/Plan Active Problems:   SBO (small bowel obstruction)   Hypokalemia   Hyperglycemia   Neuropathy   Abdominal pain   Vomiting   1. SBO? Vs ileus- NG tube in place, N/V resolved, pain resolved with pain meds, NPO except ice chips, 2 view abd in AM- flat ad  upright  2. MGUS- defer to PCP- recent diagnosis 3. Neuropathy- continue medications as tolerated 4. Hyperglycemia- SSI, check HgbA1C 5. Hypokalemia- replete  surgery  Code Status: full Family Communication: patient and family Disposition Plan: admit  Time spent: 75 min  Benjamine Mola Deyci Gesell [facility] Pager [phone]  If 7PM-7AM, please contact night-coverage [email or web address] Password [facility] 04/15/2013, 6:57 PM

## 2013-04-15 NOTE — ED Notes (Signed)
Pt unable to obtain urine sample yet

## 2013-04-15 NOTE — Progress Notes (Signed)
Attempted to call report.  Nurse stepped off of unit.  Left name and number with Diplomatic Services operational officer.   Daniel Whitaker

## 2013-04-15 NOTE — ED Provider Notes (Signed)
CSN: [id number]     Arrival date & time 04/15/13  1335 History   First MD Initiated Contact with Patient 04/15/13 1501     Chief Complaint  Patient presents with  . Abdominal Pain  . Nausea  . Emesis    Patient is a 67 y.o. male presenting with abdominal pain and vomiting.  Abdominal Pain Associated symptoms: diarrhea, nausea and vomiting   Emesis Associated symptoms: abdominal pain and diarrhea     Patient presents with concern of nausea, vomiting, abdominal pain. Patient was well until 3 days ago.  He had the insidious onset of upper abdominal pain, nausea.  Subsequently had multiple episodes of emesis and diarrhea throughout the remainder of that day.  Following that time he has had persistent anorexia, minimal by mouth intake, minimal stool production, persistent nausea, persistent abdominal pain.  The pain is sore, severe, from the upper abdomen radiating to the right flank. Minimal relief with anything. Symptoms are worse after laying supine. No concurrent fever, dyspnea, chest pain, swelling, rash. No history of abdominal surgery.   Past Medical History  Diagnosis Date  . Nontoxic uninodular goiter   . Peripheral neuropathy   . Esophageal reflux   . Erectile dysfunction   . Obesity   . Glaucoma   . Impaired fasting glucose   . Hypertriglyceridemia    Past Surgical History  Procedure Laterality Date  . Knee surgery     Family History  Problem Relation Age of Onset  . Brain cancer Mother   . Lung cancer Father    History  Substance Use Topics  . Smoking status: Former Games developer  . Smokeless tobacco: Never Used  . Alcohol Use: 0.0 oz/week     Comment: 12 BEERS WEEKLY    Review of Systems  Constitutional:       Per HPI, otherwise negative  HENT:       Per HPI, otherwise negative  Respiratory:       Per HPI, otherwise negative  Cardiovascular:       Per HPI, otherwise negative  Gastrointestinal: Positive for nausea, vomiting, abdominal pain and diarrhea.   Endocrine:       Negative aside from HPI  Genitourinary:       Neg aside from HPI   Musculoskeletal:       Per HPI, otherwise negative  Skin: Negative.   Neurological: Negative for syncope.    Allergies  Penicillins  Home Medications   Current Outpatient Rx  Name  Route  Sig  Dispense  Refill  . cetirizine (ZYRTEC) 10 MG tablet   Oral   Take 10 mg by mouth daily.         Marland Kitchen ibuprofen (ADVIL,MOTRIN) 600 MG tablet   Oral   Take 600 mg by mouth every 6 (six) hours as needed.         . latanoprost (XALATAN) 0.005 % ophthalmic solution               . meloxicam (MOBIC) 15 MG tablet               . multivitamin-iron-minerals-folic acid (CENTRUM) chewable tablet   Oral   Chew 1 tablet by mouth daily.         . nabumetone (RELAFEN) 500 MG tablet               . Omega-3 Fatty Acids (FISH OIL) 1000 MG CAPS   Oral   Take 1,000 mg by mouth daily. 3         .  pantoprazole (PROTONIX) 40 MG tablet               . pregabalin (LYRICA) 75 MG capsule   Oral   Take 75 mg by mouth 2 (two) times daily.         . promethazine (PHENERGAN) 25 MG tablet   Oral   Take 25 mg by mouth every 6 (six) hours as needed for nausea or vomiting.         . timolol (TIMOPTIC-XR) 0.5 % ophthalmic gel-forming                BP 149/84  Pulse 101  Temp(Src) 98.3 F (36.8 C) (Oral)  Resp 16  SpO2 100% Physical Exam  Nursing note and vitals reviewed. Constitutional: He is oriented to person, place, and time. He appears well-developed. No distress.  HENT:  Head: Normocephalic and atraumatic.  Eyes: Conjunctivae and EOM are normal.  Cardiovascular: Normal rate and regular rhythm.   Pulmonary/Chest: Effort normal. No stridor. No respiratory distress.  Abdominal: Soft. Normal appearance. He exhibits no distension. There is tenderness in the right upper quadrant, epigastric area, periumbilical area and left upper quadrant. There is guarding and CVA tenderness. There  is no rigidity and no rebound.  Musculoskeletal: He exhibits no edema.  Neurological: He is alert and oriented to person, place, and time.  Skin: Skin is warm and dry.  Psychiatric: He has a normal mood and affect.    ED Course  Procedures (including critical care time) Labs Review Labs Reviewed  CBC WITH DIFFERENTIAL  COMPREHENSIVE METABOLIC PANEL  LIPASE, BLOOD  URINALYSIS, ROUTINE W REFLEX MICROSCOPIC   Imaging Review No results found.  EKG Interpretation   None      Update: I discussed all findings, including CT evidence of bowel obstruction with the patient.  He has a substantial improvement in his condition following a water, fluids.  I discussed this case with our surgeon on call.  We will place an NG tube, admit to hospitalist with surgery following.   MDM   1. SBO (small bowel obstruction)    Patient presents with abdominal pain, after an episode of profound vomiting it occurred several days ago.  On exam he is awake and alert, but has a tenderness in the abdomen concerning. Patient's evaluation demonstrated the presence of a bowel obstruction.  Patient was admitted to the medicine service after I discussed this case with our surgeon.    Gerhard Munch, MD 04/15/13 [phone]

## 2013-04-15 NOTE — ED Notes (Signed)
Patient went to [facility] walk in clinic this am where he was found to have a possible SBO

## 2013-04-15 NOTE — ED Notes (Signed)
Pt aware of need of urine sample. Urinal at bedside 

## 2013-04-15 NOTE — ED Notes (Signed)
Patient transported to CT 

## 2013-04-16 ENCOUNTER — Inpatient Hospital Stay (HOSPITAL_COMMUNITY): Payer: Medicare Other

## 2013-04-16 DIAGNOSIS — G589 Mononeuropathy, unspecified: Secondary | ICD-10-CM

## 2013-04-16 LAB — CBC
HCT: 34.2 % — ABNORMAL LOW (ref 39.0–52.0)
Hemoglobin: 12.1 g/dL — ABNORMAL LOW (ref 13.0–17.0)
MCV: 90.7 fL (ref 78.0–100.0)
Platelets: 178 10*3/uL (ref 150–400)
RBC: 3.77 MIL/uL — ABNORMAL LOW (ref 4.22–5.81)
RDW: 12.7 % (ref 11.5–15.5)
WBC: 5.9 10*3/uL (ref 4.0–10.5)

## 2013-04-16 LAB — GLUCOSE, CAPILLARY
Glucose-Capillary: 80 mg/dL (ref 70–99)
Glucose-Capillary: 82 mg/dL (ref 70–99)
Glucose-Capillary: 84 mg/dL (ref 70–99)
Glucose-Capillary: 89 mg/dL (ref 70–99)
Glucose-Capillary: 95 mg/dL (ref 70–99)

## 2013-04-16 LAB — COMPREHENSIVE METABOLIC PANEL
Alkaline Phosphatase: 54 U/L (ref 39–117)
BUN: 25 mg/dL — ABNORMAL HIGH (ref 6–23)
Creatinine, Ser: 0.89 mg/dL (ref 0.50–1.35)
GFR calc Af Amer: >90 mL/min (ref >90)
Glucose, Bld: 94 mg/dL (ref 70–99)
Potassium: 3.2 mEq/L — ABNORMAL LOW (ref 3.5–5.1)
Total Bilirubin: 0.5 mg/dL (ref 0.3–1.2)
Total Protein: 5.9 g/dL — ABNORMAL LOW (ref 6.0–8.3)

## 2013-04-16 LAB — HEMOGLOBIN A1C
Hgb A1c MFr Bld: 5.2 % (ref <5.7)
Mean Plasma Glucose: 103 mg/dL (ref <117)

## 2013-04-16 LAB — TSH: TSH: 1.195 u[IU]/mL (ref 0.350–4.500)

## 2013-04-16 MED ORDER — ENOXAPARIN SODIUM 60 MG/0.6ML ~~LOC~~ SOLN
50.0000 mg | Freq: Every day | SUBCUTANEOUS | Status: DC
  Administered 2013-04-16 – 2013-04-24 (×9): 50 mg via SUBCUTANEOUS
  Filled 2013-04-16 (×10): qty 0.6

## 2013-04-16 MED ORDER — BIOTENE DRY MOUTH MT LIQD
15.0000 mL | Freq: Two times a day (BID) | OROMUCOSAL | Status: DC
  Administered 2013-04-16 – 2013-04-22 (×6): 15 mL via OROMUCOSAL

## 2013-04-16 MED ORDER — DEXTROSE 5 % IV SOLN
INTRAVENOUS | Status: DC
  Administered 2013-04-16: via INTRAVENOUS

## 2013-04-16 MED ORDER — CHLORHEXIDINE GLUCONATE 0.12 % MT SOLN
15.0000 mL | Freq: Two times a day (BID) | OROMUCOSAL | Status: DC
  Administered 2013-04-16 – 2013-04-24 (×13): 15 mL via OROMUCOSAL
  Filled 2013-04-16 (×21): qty 15

## 2013-04-16 NOTE — Progress Notes (Signed)
Hypoglycemic Event  CBG: 69  Treatment: 15 GM carbohydrate snack  Symptoms: None  Follow-up CBG: Time:2350 CBG Result:84  Possible Reasons for Event: Inadequate meal intake  Comments/MD notified: [facility]    Fuller [place] L  Remember to initiate Hypoglycemia Order Set & complete

## 2013-04-16 NOTE — Progress Notes (Signed)
[facility] PROGRESS NOTE  Daniel Whitaker Sr. WUJ:[id number] DOB: [birth year] DOA: 04/15/2013 PCP: [name], MD  Assessment/Plan: 1. SBO vs ileus 1. Appreciate Surgery recs 2. Clamping NG today and follow 3. Keeping NPO for now 4. Supportive care 2. MGUS 1. Defer to PCP 3. Neuropathy 1. Cont current regimen 4. Hyperglycemia 1. On SSI 5. Hypokalemia 1. Follow and replace as needed  Code Status: Full Family Communication: Pt in room (indicate person spoken with, relationship, and if by phone, the number) Disposition Plan: Pending  Consultants:  General Surgery  HPI/Subjective: No significant events overnight.  Objective: Filed Vitals:   04/15/13 1918 04/15/13 2000 04/15/13 2048 04/16/13 0540  BP: 133/72  128/76 127/76  Pulse: 83  81 73  Temp: 97.9 F (36.6 C)  97.4 F (36.3 C) 97.7 F (36.5 C)  TempSrc: Oral  Oral Oral  Resp: 18  18 18   Height:  6\' 1"  (1.854 m)    Weight:  104.327 kg (230 lb)    SpO2: 93%  93% 92%    Intake/Output Summary (Last 24 hours) at 04/16/13 0830 Last data filed at 04/16/13 0600  Gross per 24 hour  Intake    900 ml  Output    150 ml  Net    750 ml   Filed Weights   04/15/13 2000  Weight: 104.327 kg (230 lb)   Exam:  General:  Awake, in nad  Cardiovascular: regular, s1, s2  Respiratory: normal resp effort, no wheezing  Abdomen: soft, nondistended  Musculoskeletal: perfused, no clubbing   Data Reviewed: Basic Metabolic Panel:  Recent Labs Lab 04/15/13 1425 04/16/13 0426  NA 138 139  K 3.2* 3.2*  CL 102 107  CO2 22 24  GLUCOSE 131* 94  BUN 28* 25*  CREATININE 0.90 0.89  CALCIUM 9.4 8.4   Liver Function Tests:  Recent Labs Lab 04/15/13 1425 04/16/13 0426  AST 22 13  ALT 31 21  ALKPHOS 71 54  BILITOT 0.8 0.5  PROT 7.7 5.9*  ALBUMIN 4.0 3.0*    Recent Labs Lab 04/15/13 1425  LIPASE 48   No results found for this basename: AMMONIA,  in the last 168 hours CBC:  Recent Labs Lab  04/15/13 1425 04/16/13 0426  WBC 7.4 5.9  NEUTROABS 5.1  --   HGB 15.2 12.1*  HCT 42.2 34.2*  MCV 87.9 90.7  PLT 227 178   Cardiac Enzymes: No results found for this basename: CKTOTAL, CKMB, CKMBINDEX, TROPONINI,  in the last 168 hours BNP (last 3 results) No results found for this basename: PROBNP,  in the last 8760 hours CBG:  Recent Labs Lab 04/15/13 2030 04/15/13 2339 04/16/13 0357  GLUCAP 99 96 85    No results found for this or any previous visit (from the past 240 hour(s)).   Studies: Ct Abdomen Pelvis W Contrast  04/15/2013   CLINICAL DATA:  Possible small bowel obstruction. Nausea and vomiting.  EXAM: CT ABDOMEN AND PELVIS WITH CONTRAST  TECHNIQUE: Multidetector CT imaging of the abdomen and pelvis was performed using the standard protocol following bolus administration of intravenous contrast.  CONTRAST:  OMNIPAQUE IOHEXOL 300 MG/ML  SOLN  COMPARISON:  None.  FINDINGS: Lower Chest: Subsegmental atelectasis at the bases. Mild cardiomegaly, without pericardial or pleural effusion. Small hiatal hernia.  Abdomen/Pelvis: Normal liver, spleen, stomach. Descending duodenal diverticulum.  Normal pancreas, gallbladder, biliary tract, adrenal glands. Bilateral low-density renal lesions. Primarily too small to characterize. The larger lesions are consistent with cysts.  No  retroperitoneal or retrocrural adenopathy. Aortic and branch vessel atherosclerosis.  The colon is normal in caliber.  Normal terminal ileum and appendix.  Moderate small bowel dilatation proximally at up to 4.5 cm. Fluid filled bowel loops continue to the level of a transition in the distal ileum. This is relatively gradual and identified on images 50 through 63 of series 2. No obstructive mass is seen. There is no bowel wall thickening. No pneumatosis or free intraperitoneal air. There is perienteric fluid within the upper pelvis and cul-de-sac.  No pelvic adenopathy. Right-sided hydrocele incompletely imaged.  Normal urinary bladder and prostate.  Bones/Musculoskeletal:  Congenitally short lumbar pedicles.  IMPRESSION: 1. Findings most consistent with small bowel obstruction at the distal ileum. No definite cause identified. Presumably secondary to adhesions. If there has not been prior surgery, alternative explanations such as otherwise occult internal hernia would be considerations 2. No small ball wall thickening to suggest complicating ischemia. There is nonspecific perienteric fluid within the upper pelvis and cul-de-sac. 3. Small hiatal hernia.   Electronically Signed   By: Jeronimo Greaves M.D.   On: 04/15/2013 16:41   Dg Abd 2 Views  04/16/2013   CLINICAL DATA:  Abdominal pain, distention, small bowel obstruction  EXAM: ABDOMEN - 2 VIEW  COMPARISON:  04/15/2013  FINDINGS: NG tube curled in the proximal stomach. Stomach is decompressed. Several loops of dilated small bowel persist throughout the abdomen, with a diameter of 6.8 cm in left upper quadrant. Associated small bowel air-fluid levels on the upright exam. Contrast however has migrated into the colon. Contrast has actually reached the rectum. Findings compatible with partial small bowel obstruction. No free air. Lung bases clear. Degenerative changes of the spine. Retained excreted contrast in the bladder.  IMPRESSION: Persistent partial small bowel obstruction pattern. Contrast has migrated into the colon. No free air.   Electronically Signed   By: Ruel Favors M.D.   On: 04/16/2013 08:06    Scheduled Meds: . sodium chloride   Intravenous STAT  . antiseptic oral rinse  15 mL Mouth Rinse q12n4p  . chlorhexidine  15 mL Mouth Rinse BID  . enoxaparin (LOVENOX) injection  60 mg Subcutaneous QHS  . insulin aspart  0-15 Units Subcutaneous Q4H  . latanoprost  1 drop Both Eyes QHS  . pantoprazole  40 mg Oral Daily  . pregabalin  125 mg Oral BID  . timolol  1 drop Both Eyes Daily   Continuous Infusions: . 0.9 % NaCl with KCl 40 mEq / L 100 mL/hr at  04/16/13 3875    Active Problems:   SBO (small bowel obstruction)   Hypokalemia   Hyperglycemia   Neuropathy   Abdominal pain   Vomiting  Time spent:  CHIU, STEPHEN K  [facility] Pager [phone]. If 7PM-7AM, please contact night-coverage at [email or web address], password [facility] 04/16/2013, 8:30 AM  LOS: 1 day

## 2013-04-16 NOTE — Progress Notes (Signed)
Subjective: +flatus, no BM, pain resolved  Objective: Vital signs in last 24 hours: Temp:  [97.4 F (36.3 C)-98.3 F (36.8 C)] 97.7 F (36.5 C) (11/16 0540) Pulse Rate:  [73-101] 73 (11/16 0540) Resp:  [16-18] 18 (11/16 0540) BP: (127-155)/(72-84) 127/76 mmHg (11/16 0540) SpO2:  [92 %-100 %] 92 % (11/16 0540) Weight:  [230 lb (104.327 kg)] 230 lb (104.327 kg) (11/15 2000)    Intake/Output from previous day: 11/15 0701 - 11/16 0700 In: 900 [I.V.:900] Out: 150 [Emesis/NG output:150] Intake/Output this shift:    General appearance: alert, cooperative and no distress GI: soft, NT, moderate distension and tympany, small reducible umbilical hernia, no peritoneal signs, NG with of mostly clear output  Lab Results:   Recent Labs  04/15/13 1425 04/16/13 0426  WBC 7.4 5.9  HGB 15.2 12.1*  HCT 42.2 34.2*  PLT 227 178   BMET  Recent Labs  04/15/13 1425 04/16/13 0426  NA 138 139  K 3.2* 3.2*  CL 102 107  CO2 22 24  GLUCOSE 131* 94  BUN 28* 25*  CREATININE 0.90 0.89  CALCIUM 9.4 8.4   PT/INR No results found for this basename: LABPROT, INR,  in the last 72 hours ABG No results found for this basename: PHART, PCO2, PO2, HCO3,  in the last 72 hours  Studies/Results: Ct Abdomen Pelvis W Contrast  04/15/2013   CLINICAL DATA:  Possible small bowel obstruction. Nausea and vomiting.  EXAM: CT ABDOMEN AND PELVIS WITH CONTRAST  TECHNIQUE: Multidetector CT imaging of the abdomen and pelvis was performed using the standard protocol following bolus administration of intravenous contrast.  CONTRAST:  OMNIPAQUE IOHEXOL 300 MG/ML  SOLN  COMPARISON:  None.  FINDINGS: Lower Chest: Subsegmental atelectasis at the bases. Mild cardiomegaly, without pericardial or pleural effusion. Small hiatal hernia.  Abdomen/Pelvis: Normal liver, spleen, stomach. Descending duodenal diverticulum.  Normal pancreas, gallbladder, biliary tract, adrenal glands. Bilateral low-density renal  lesions. Primarily too small to characterize. The larger lesions are consistent with cysts.  No retroperitoneal or retrocrural adenopathy. Aortic and branch vessel atherosclerosis.  The colon is normal in caliber.  Normal terminal ileum and appendix.  Moderate small bowel dilatation proximally at up to 4.5 cm. Fluid filled bowel loops continue to the level of a transition in the distal ileum. This is relatively gradual and identified on images 50 through 63 of series 2. No obstructive mass is seen. There is no bowel wall thickening. No pneumatosis or free intraperitoneal air. There is perienteric fluid within the upper pelvis and cul-de-sac.  No pelvic adenopathy. Right-sided hydrocele incompletely imaged. Normal urinary bladder and prostate.  Bones/Musculoskeletal:  Congenitally short lumbar pedicles.  IMPRESSION: 1. Findings most consistent with small bowel obstruction at the distal ileum. No definite cause identified. Presumably secondary to adhesions. If there has not been prior surgery, alternative explanations such as otherwise occult internal hernia would be considerations 2. No small ball wall thickening to suggest complicating ischemia. There is nonspecific perienteric fluid within the upper pelvis and cul-de-sac. 3. Small hiatal hernia.   Electronically Signed   By: Jeronimo Greaves M.D.   On: 04/15/2013 16:41   Dg Abd 2 Views  04/16/2013   CLINICAL DATA:  Abdominal pain, distention, small bowel obstruction  EXAM: ABDOMEN - 2 VIEW  COMPARISON:  04/15/2013  FINDINGS: NG tube curled in the proximal stomach. Stomach is decompressed. Several loops of dilated small bowel persist throughout the abdomen, with a diameter of 6.8 cm in left upper quadrant. Associated small  bowel air-fluid levels on the upright exam. Contrast however has migrated into the colon. Contrast has actually reached the rectum. Findings compatible with partial small bowel obstruction. No free air. Lung bases clear. Degenerative changes of  the spine. Retained excreted contrast in the bladder.  IMPRESSION: Persistent partial small bowel obstruction pattern. Contrast has migrated into the colon. No free air.   Electronically Signed   By: Ruel Favors M.D.   On: 04/16/2013 08:06    Anti-infectives: Anti-infectives   None      Assessment/Plan: s/p * No surgery found * He is still distended but no nausea and NG not putting out much.  His CT contrast is in the colon arguing against bowel obstruction but he still has persistent small bowel dilation.  We talked about the causes of obstruction such as adhesions, hernias, and tumors but his story is more consistent with ileus or gastroenteritis.  He is clinically improving and not sure that the NG is doing much for him now so will try clamping NG and removing this later today if still doing okay.  I am still concerned about his abdominal distension so will keep NPO for now until we have more evidence of return of bowel function  LOS: 1 day    Lodema Pilot DAVID 04/16/2013

## 2013-04-17 ENCOUNTER — Inpatient Hospital Stay (HOSPITAL_COMMUNITY): Payer: Medicare Other

## 2013-04-17 LAB — BASIC METABOLIC PANEL
BUN: 17 mg/dL (ref 6–23)
CO2: 23 mEq/L (ref 19–32)
Calcium: 9 mg/dL (ref 8.4–10.5)
Chloride: 105 mEq/L (ref 96–112)
Creatinine, Ser: 0.83 mg/dL (ref 0.50–1.35)
GFR calc Af Amer: >90 mL/min (ref >90)
Glucose, Bld: 104 mg/dL — ABNORMAL HIGH (ref 70–99)

## 2013-04-17 LAB — GLUCOSE, CAPILLARY
Glucose-Capillary: 111 mg/dL — ABNORMAL HIGH (ref 70–99)
Glucose-Capillary: 87 mg/dL (ref 70–99)

## 2013-04-17 MED ORDER — KCL IN DEXTROSE-NACL 40-5-0.9 MEQ/L-%-% IV SOLN
INTRAVENOUS | Status: DC
  Administered 2013-04-17 – 2013-04-18 (×3): via INTRAVENOUS
  Filled 2013-04-17 (×4): qty 1000

## 2013-04-17 MED ORDER — LORATADINE 10 MG PO TABS
10.0000 mg | ORAL_TABLET | Freq: Every day | ORAL | Status: DC
  Administered 2013-04-17 – 2013-04-20 (×4): 10 mg via ORAL
  Filled 2013-04-17 (×5): qty 1

## 2013-04-17 NOTE — Progress Notes (Signed)
Patient seen and examined.  Contrast in left colon and rectum indicating partial obstruction vs ileus.

## 2013-04-17 NOTE — Progress Notes (Signed)
Subjective: He feels better, some wet flatus and small amount of stool.  He was really sick last week with N/V.    Objective: Vital signs in last 24 hours: Temp:  [97.4 F (36.3 C)-98.6 F (37 C)] 97.4 F (36.3 C) (11/17 0637) Pulse Rate:  [72-79] 72 (11/17 0637) Resp:  [18-20] 18 (11/17 0637) BP: (142-153)/(79) 149/79 mmHg (11/17 0637) SpO2:  [92 %-97 %] 95 % (11/17 0637) Last BM Date: 04/16/13 NG out No BM Afebrile, VSS NO labs Film yesterday shows contrast in colon, with dilated SB Diet: none K+ low yesterday, I don't see that has been replaced. Intake/Output from previous day: 11/16 0701 - 11/17 0700 In: 1520.8 [I.V.:1520.8] Out: 800 [Urine:700; Emesis/NG output:100] Intake/Output this shift:    General appearance: alert, cooperative and no distress GI: soft, non tender, few BS and some flatus, he is big, but not significantly distended.  Lab Results:   Recent Labs  04/15/13 1425 04/16/13 0426  WBC 7.4 5.9  HGB 15.2 12.1*  HCT 42.2 34.2*  PLT 227 178    BMET  Recent Labs  04/15/13 1425 04/16/13 0426  NA 138 139  K 3.2* 3.2*  CL 102 107  CO2 22 24  GLUCOSE 131* 94  BUN 28* 25*  CREATININE 0.90 0.89  CALCIUM 9.4 8.4   PT/INR No results found for this basename: LABPROT, INR,  in the last 72 hours   Recent Labs Lab 04/15/13 1425 04/16/13 0426  AST 22 13  ALT 31 21  ALKPHOS 71 54  BILITOT 0.8 0.5  PROT 7.7 5.9*  ALBUMIN 4.0 3.0*     Lipase     Component Value Date/Time   LIPASE 48 04/15/2013 1425     Studies/Results: Ct Abdomen Pelvis W Contrast  04/15/2013   CLINICAL DATA:  Possible small bowel obstruction. Nausea and vomiting.  EXAM: CT ABDOMEN AND PELVIS WITH CONTRAST  TECHNIQUE: Multidetector CT imaging of the abdomen and pelvis was performed using the standard protocol following bolus administration of intravenous contrast.  CONTRAST:  OMNIPAQUE IOHEXOL 300 MG/ML  SOLN  COMPARISON:  None.  FINDINGS: Lower Chest:  Subsegmental atelectasis at the bases. Mild cardiomegaly, without pericardial or pleural effusion. Small hiatal hernia.  Abdomen/Pelvis: Normal liver, spleen, stomach. Descending duodenal diverticulum.  Normal pancreas, gallbladder, biliary tract, adrenal glands. Bilateral low-density renal lesions. Primarily too small to characterize. The larger lesions are consistent with cysts.  No retroperitoneal or retrocrural adenopathy. Aortic and branch vessel atherosclerosis.  The colon is normal in caliber.  Normal terminal ileum and appendix.  Moderate small bowel dilatation proximally at up to 4.5 cm. Fluid filled bowel loops continue to the level of a transition in the distal ileum. This is relatively gradual and identified on images 50 through 63 of series 2. No obstructive mass is seen. There is no bowel wall thickening. No pneumatosis or free intraperitoneal air. There is perienteric fluid within the upper pelvis and cul-de-sac.  No pelvic adenopathy. Right-sided hydrocele incompletely imaged. Normal urinary bladder and prostate.  Bones/Musculoskeletal:  Congenitally short lumbar pedicles.  IMPRESSION: 1. Findings most consistent with small bowel obstruction at the distal ileum. No definite cause identified. Presumably secondary to adhesions. If there has not been prior surgery, alternative explanations such as otherwise occult internal hernia would be considerations 2. No small ball wall thickening to suggest complicating ischemia. There is nonspecific perienteric fluid within the upper pelvis and cul-de-sac. 3. Small hiatal hernia.   Electronically Signed   By: Jeronimo Greaves  M.D.   On: 04/15/2013 16:41   Dg Abd 2 Views  04/16/2013   CLINICAL DATA:  Abdominal pain, distention, small bowel obstruction  EXAM: ABDOMEN - 2 VIEW  COMPARISON:  04/15/2013  FINDINGS: NG tube curled in the proximal stomach. Stomach is decompressed. Several loops of dilated small bowel persist throughout the abdomen, with a diameter of 6.8  cm in left upper quadrant. Associated small bowel air-fluid levels on the upright exam. Contrast however has migrated into the colon. Contrast has actually reached the rectum. Findings compatible with partial small bowel obstruction. No free air. Lung bases clear. Degenerative changes of the spine. Retained excreted contrast in the bladder.  IMPRESSION: Persistent partial small bowel obstruction pattern. Contrast has migrated into the colon. No free air.   Electronically Signed   By: Ruel Favors M.D.   On: 04/16/2013 08:06    Medications: . antiseptic oral rinse  15 mL Mouth Rinse q12n4p  . chlorhexidine  15 mL Mouth Rinse BID  . enoxaparin (LOVENOX) injection  50 mg Subcutaneous QHS  . insulin aspart  0-15 Units Subcutaneous Q4H  . latanoprost  1 drop Both Eyes QHS  . pantoprazole  40 mg Oral Daily  . pregabalin  125 mg Oral BID  . timolol  1 drop Both Eyes Daily    Assessment/Plan Nausea, vomiting, abdominal pain, diarrhea SBO vs ileus Goiter Peripheral neuropathy,  Monoclonal gammopathy Body mass index is 30.3    Plan:  Repeat BMP and be sure K+ is replaced.  Recheck film, start some clears.    LOS: 2 days    Kynslei Art 04/17/2013

## 2013-04-17 NOTE — Progress Notes (Signed)
[facility] PROGRESS NOTE  Daniel WINNETT Sr. WUJ:[id number] DOB: Mar 25, 1946 DOA: 04/15/2013 PCP: [name], MD  Assessment/Plan: 1. SBO vs ileus 1. Appreciate Surgery recs 2. Some BM noted with radiologic evidence of obstruction 3. Trials of PO per surgery 4. Supportive care 2. MGUS 1. Defer to PCP 3. Neuropathy 1. Cont current regimen 4. Hyperglycemia 1. On SSI 5. Hypokalemia 1. Follow and replace as needed  Code Status: Full Family Communication: Pt in room (indicate person spoken with, relationship, and if by phone, the number) Disposition Plan: Pending  Consultants:  General Surgery  HPI/Subjective: No significant events overnight.  Objective: Filed Vitals:   04/16/13 0540 04/16/13 1400 04/16/13 2100 04/17/13 0637  BP: 127/76 142/79 153/79 149/79  Pulse: 73 79 77 72  Temp: 97.7 F (36.5 C) 97.6 F (36.4 C) 98.6 F (37 C) 97.4 F (36.3 C)  TempSrc: Oral Oral Axillary Oral  Resp: 18 18 20 18   Height:      Weight:      SpO2: 92% 92% 97% 95%    Intake/Output Summary (Last 24 hours) at 04/17/13 1202 Last data filed at 04/17/13 1000  Gross per 24 hour  Intake 1120.83 ml  Output    700 ml  Net 420.83 ml   Filed Weights   04/15/13 2000  Weight: 104.327 kg (230 lb)   Exam:  General:  Awake, in nad  Cardiovascular: regular, s1, s2  Respiratory: normal resp effort, no wheezing  Abdomen: soft, nondistended  Musculoskeletal: perfused, no clubbing   Data Reviewed: Basic Metabolic Panel:  Recent Labs Lab 04/15/13 1425 04/16/13 0426 04/17/13 0840  NA 138 139 138  K 3.2* 3.2* 3.5  CL 102 107 105  CO2 22 24 23   GLUCOSE 131* 94 104*  BUN 28* 25* 17  CREATININE 0.90 0.89 0.83  CALCIUM 9.4 8.4 9.0   Liver Function Tests:  Recent Labs Lab 04/15/13 1425 04/16/13 0426  AST 22 13  ALT 31 21  ALKPHOS 71 54  BILITOT 0.8 0.5  PROT 7.7 5.9*  ALBUMIN 4.0 3.0*    Recent Labs Lab 04/15/13 1425  LIPASE 48   No results found  for this basename: AMMONIA,  in the last 168 hours CBC:  Recent Labs Lab 04/15/13 1425 04/16/13 0426  WBC 7.4 5.9  NEUTROABS 5.1  --   HGB 15.2 12.1*  HCT 42.2 34.2*  MCV 87.9 90.7  PLT 227 178   Cardiac Enzymes: No results found for this basename: CKTOTAL, CKMB, CKMBINDEX, TROPONINI,  in the last 168 hours BNP (last 3 results) No results found for this basename: PROBNP,  in the last 8760 hours CBG:  Recent Labs Lab 04/16/13 2017 04/16/13 2315 04/16/13 2351 04/17/13 0443 04/17/13 0720  GLUCAP 80 69* 84 87 94    No results found for this or any previous visit (from the past 240 hour(s)).   Studies: Ct Abdomen Pelvis W Contrast  04/15/2013   CLINICAL DATA:  Possible small bowel obstruction. Nausea and vomiting.  EXAM: CT ABDOMEN AND PELVIS WITH CONTRAST  TECHNIQUE: Multidetector CT imaging of the abdomen and pelvis was performed using the standard protocol following bolus administration of intravenous contrast.  CONTRAST:  OMNIPAQUE IOHEXOL 300 MG/ML  SOLN  COMPARISON:  None.  FINDINGS: Lower Chest: Subsegmental atelectasis at the bases. Mild cardiomegaly, without pericardial or pleural effusion. Small hiatal hernia.  Abdomen/Pelvis: Normal liver, spleen, stomach. Descending duodenal diverticulum.  Normal pancreas, gallbladder, biliary tract, adrenal glands. Bilateral low-density renal lesions. Primarily too small  to characterize. The larger lesions are consistent with cysts.  No retroperitoneal or retrocrural adenopathy. Aortic and branch vessel atherosclerosis.  The colon is normal in caliber.  Normal terminal ileum and appendix.  Moderate small bowel dilatation proximally at up to 4.5 cm. Fluid filled bowel loops continue to the level of a transition in the distal ileum. This is relatively gradual and identified on images 50 through 63 of series 2. No obstructive mass is seen. There is no bowel wall thickening. No pneumatosis or free intraperitoneal air. There is perienteric  fluid within the upper pelvis and cul-de-sac.  No pelvic adenopathy. Right-sided hydrocele incompletely imaged. Normal urinary bladder and prostate.  Bones/Musculoskeletal:  Congenitally short lumbar pedicles.  IMPRESSION: 1. Findings most consistent with small bowel obstruction at the distal ileum. No definite cause identified. Presumably secondary to adhesions. If there has not been prior surgery, alternative explanations such as otherwise occult internal hernia would be considerations 2. No small ball wall thickening to suggest complicating ischemia. There is nonspecific perienteric fluid within the upper pelvis and cul-de-sac. 3. Small hiatal hernia.   Electronically Signed   By: Jeronimo Greaves M.D.   On: 04/15/2013 16:41   Dg Abd 2 Views  04/17/2013   CLINICAL DATA:  History of small bowel obstruction.  EXAM: ABDOMEN - 2 VIEW  COMPARISON:  04/16/2013  FINDINGS: A component of decompression is identified within previously described dilated loops of small bowel. A small residual focus of dilated loops of small bowel with air-fluid levels project within the left upper quadrant. The colon is decompressed containing residual barium. Visualized osseous structures are unremarkable. The patient's NG tube is been removed in the interim  IMPRESSION: 1. Radiographic findings consistent with a resolving small bowel obstruction surveillance evaluation recommended if and as clinically warranted.   Electronically Signed   By: Salome Holmes M.D.   On: 04/17/2013 09:30   Dg Abd 2 Views  04/16/2013   CLINICAL DATA:  Abdominal pain, distention, small bowel obstruction  EXAM: ABDOMEN - 2 VIEW  COMPARISON:  04/15/2013  FINDINGS: NG tube curled in the proximal stomach. Stomach is decompressed. Several loops of dilated small bowel persist throughout the abdomen, with a diameter of 6.8 cm in left upper quadrant. Associated small bowel air-fluid levels on the upright exam. Contrast however has migrated into the colon. Contrast  has actually reached the rectum. Findings compatible with partial small bowel obstruction. No free air. Lung bases clear. Degenerative changes of the spine. Retained excreted contrast in the bladder.  IMPRESSION: Persistent partial small bowel obstruction pattern. Contrast has migrated into the colon. No free air.   Electronically Signed   By: Ruel Favors M.D.   On: 04/16/2013 08:06    Scheduled Meds: . antiseptic oral rinse  15 mL Mouth Rinse q12n4p  . chlorhexidine  15 mL Mouth Rinse BID  . enoxaparin (LOVENOX) injection  50 mg Subcutaneous QHS  . insulin aspart  0-15 Units Subcutaneous Q4H  . latanoprost  1 drop Both Eyes QHS  . pantoprazole  40 mg Oral Daily  . pregabalin  125 mg Oral BID  . timolol  1 drop Both Eyes Daily   Continuous Infusions: . dextrose 5 % and 0.9 % NaCl with KCl 40 mEq/L 100 mL/hr at 04/17/13 1028    Active Problems:   SBO (small bowel obstruction)   Hypokalemia   Hyperglycemia   Neuropathy   Abdominal pain   Vomiting  Time spent:  CHIU, STEPHEN K  [facility]  Pager [phone]. If 7PM-7AM, please contact night-coverage at [email or web address], password [facility] 04/17/2013, 12:02 PM  LOS: 2 days

## 2013-04-18 NOTE — Progress Notes (Signed)
[facility] PROGRESS NOTE  Daniel DALTON Sr. WUJ:[id number] DOB: 1946-04-10 DOA: 04/15/2013 PCP: [name], MD  Assessment/Plan: 1. SBO vs ileus 1. Appreciate Surgery recs 2. Some BM noted with radiologic evidence of obstruction 3. Trials of advancing PO per surgery 4. Supportive care for now 5. Possible d/c tomorrow if pt tolerates regular po tonight 2. MGUS 1. Defer to PCP 3. Neuropathy 1. Cont current regimen 4. Hyperglycemia 1. On SSI 5. Hypokalemia 1. Follow and replace as needed  Code Status: Full Family Communication: Pt in room (indicate person spoken with, relationship, and if by phone, the number) Disposition Plan: Pending  Consultants:  General Surgery  HPI/Subjective: No significant events overnight.  Objective: Filed Vitals:   04/17/13 1400 04/17/13 2208 04/18/13 0458 04/18/13 0815  BP: 150/79 148/77 155/79 162/83  Pulse: 76 73 73 80  Temp: 98.2 F (36.8 C) 97.9 F (36.6 C) 97.5 F (36.4 C) 97.5 F (36.4 C)  TempSrc: Oral Oral Oral Oral  Resp: 18 18 18 20   Height:      Weight:      SpO2: 96% 97% 96% 95%    Intake/Output Summary (Last 24 hours) at 04/18/13 1009 Last data filed at 04/18/13 0830  Gross per 24 hour  Intake 2313.33 ml  Output   3550 ml  Net -1236.67 ml   Filed Weights   04/15/13 2000  Weight: 104.327 kg (230 lb)   Exam:  General:  Awake, in nad  Cardiovascular: regular, s1, s2  Respiratory: normal resp effort, no wheezing  Abdomen: soft, nondistended  Musculoskeletal: perfused, no clubbing   Data Reviewed: Basic Metabolic Panel:  Recent Labs Lab 04/15/13 1425 04/16/13 0426 04/17/13 0840  NA 138 139 138  K 3.2* 3.2* 3.5  CL 102 107 105  CO2 22 24 23   GLUCOSE 131* 94 104*  BUN 28* 25* 17  CREATININE 0.90 0.89 0.83  CALCIUM 9.4 8.4 9.0   Liver Function Tests:  Recent Labs Lab 04/15/13 1425 04/16/13 0426  AST 22 13  ALT 31 21  ALKPHOS 71 54  BILITOT 0.8 0.5  PROT 7.7 5.9*  ALBUMIN  4.0 3.0*    Recent Labs Lab 04/15/13 1425  LIPASE 48   No results found for this basename: AMMONIA,  in the last 168 hours CBC:  Recent Labs Lab 04/15/13 1425 04/16/13 0426  WBC 7.4 5.9  NEUTROABS 5.1  --   HGB 15.2 12.1*  HCT 42.2 34.2*  MCV 87.9 90.7  PLT 227 178   Cardiac Enzymes: No results found for this basename: CKTOTAL, CKMB, CKMBINDEX, TROPONINI,  in the last 168 hours BNP (last 3 results) No results found for this basename: PROBNP,  in the last 8760 hours CBG:  Recent Labs Lab 04/17/13 1627 04/17/13 1945 04/17/13 2333 04/18/13 0431 04/18/13 0740  GLUCAP 106* 119* 111* 102* 112*    No results found for this or any previous visit (from the past 240 hour(s)).   Studies: Dg Abd 2 Views  04/17/2013   CLINICAL DATA:  History of small bowel obstruction.  EXAM: ABDOMEN - 2 VIEW  COMPARISON:  04/16/2013  FINDINGS: A component of decompression is identified within previously described dilated loops of small bowel. A small residual focus of dilated loops of small bowel with air-fluid levels project within the left upper quadrant. The colon is decompressed containing residual barium. Visualized osseous structures are unremarkable. The patient's NG tube is been removed in the interim  IMPRESSION: 1. Radiographic findings consistent with a resolving small bowel  obstruction surveillance evaluation recommended if and as clinically warranted.   Electronically Signed   By: Salome Holmes M.D.   On: 04/17/2013 09:30    Scheduled Meds: . antiseptic oral rinse  15 mL Mouth Rinse q12n4p  . chlorhexidine  15 mL Mouth Rinse BID  . enoxaparin (LOVENOX) injection  50 mg Subcutaneous QHS  . insulin aspart  0-15 Units Subcutaneous Q4H  . latanoprost  1 drop Both Eyes QHS  . loratadine  10 mg Oral Daily  . pantoprazole  40 mg Oral Daily  . pregabalin  125 mg Oral BID  . timolol  1 drop Both Eyes Daily   Continuous Infusions: . dextrose 5 % and 0.9 % NaCl with KCl 40 mEq/L 100  mL/hr at 04/18/13 7829    Active Problems:   SBO (small bowel obstruction)   Hypokalemia   Hyperglycemia   Neuropathy   Abdominal pain   Vomiting  Time spent:  Nickie Warwick K  [facility] Pager [phone]. If 7PM-7AM, please contact night-coverage at [email or web address], password [facility] 04/18/2013, 10:09 AM  LOS: 3 days

## 2013-04-18 NOTE — Progress Notes (Signed)
Patient seen and examined.  Agree with PA's note. If he tolerates full liquids can advance diet.

## 2013-04-18 NOTE — Progress Notes (Signed)
Patient with stable vital signs, patient tolerating full liquid diet without complaints of nausea or vomiting, patient with active bowel sounds, patient tolerating advanced diet no complaints of nausea or pain, will continue to monitor Daniel Breed RN 04-18-2013 15:42pm

## 2013-04-18 NOTE — Progress Notes (Signed)
Subjective: No problems with clear liquids, passing gas/wet flatus.  No BM. He was walking allot yesterday.  Still feels a bit distended, but much better.  Objective: Vital signs in last 24 hours: Temp:  [97.5 F (36.4 C)-98.2 F (36.8 C)] 97.5 F (36.4 C) (11/18 0458) Pulse Rate:  [73-76] 73 (11/18 0458) Resp:  [18] 18 (11/18 0458) BP: (148-155)/(77-79) 155/79 mmHg (11/18 0458) SpO2:  [96 %-97 %] 96 % (11/18 0458) Last BM Date: 04/16/13 360 PO Diet: clears No emesis No BM recorded Afebrile, VSS K+3.5 yesterday Intake/Output from previous day: 2023-05-04 0701 - 11/18 0700 In: 2513.3 [P.O.:360; I.V.:2153.3] Out: 2550 [Urine:2550] Intake/Output this shift:    General appearance: alert, cooperative and no distress GI: soft, non-tender; bowel sounds normal; no masses,  no organomegaly and minimally distended if distended at all.  Lab Results:   Recent Labs  04/15/13 1425 04/16/13 0426  WBC 7.4 5.9  HGB 15.2 12.1*  HCT 42.2 34.2*  PLT 227 178    BMET  Recent Labs  04/16/13 0426 05-03-2013 0840  NA 139 138  K 3.2* 3.5  CL 107 105  CO2 24 23  GLUCOSE 94 104*  BUN 25* 17  CREATININE 0.89 0.83  CALCIUM 8.4 9.0   PT/INR No results found for this basename: LABPROT, INR,  in the last 72 hours   Recent Labs Lab 04/15/13 1425 04/16/13 0426  AST 22 13  ALT 31 21  ALKPHOS 71 54  BILITOT 0.8 0.5  PROT 7.7 5.9*  ALBUMIN 4.0 3.0*     Lipase     Component Value Date/Time   LIPASE 48 04/15/2013 1425     Studies/Results: Dg Abd 2 Views  03-May-2013   CLINICAL DATA:  History of small bowel obstruction.  EXAM: ABDOMEN - 2 VIEW  COMPARISON:  04/16/2013  FINDINGS: A component of decompression is identified within previously described dilated loops of small bowel. A small residual focus of dilated loops of small bowel with air-fluid levels project within the left upper quadrant. The colon is decompressed containing residual barium. Visualized osseous structures are  unremarkable. The patient's NG tube is been removed in the interim  IMPRESSION: 1. Radiographic findings consistent with a resolving small bowel obstruction surveillance evaluation recommended if and as clinically warranted.   Electronically Signed   By: Salome Holmes M.D.   On: 05/03/13 09:30   Dg Abd 2 Views  04/16/2013   CLINICAL DATA:  Abdominal pain, distention, small bowel obstruction  EXAM: ABDOMEN - 2 VIEW  COMPARISON:  04/15/2013  FINDINGS: NG tube curled in the proximal stomach. Stomach is decompressed. Several loops of dilated small bowel persist throughout the abdomen, with a diameter of 6.8 cm in left upper quadrant. Associated small bowel air-fluid levels on the upright exam. Contrast however has migrated into the colon. Contrast has actually reached the rectum. Findings compatible with partial small bowel obstruction. No free air. Lung bases clear. Degenerative changes of the spine. Retained excreted contrast in the bladder.  IMPRESSION: Persistent partial small bowel obstruction pattern. Contrast has migrated into the colon. No free air.   Electronically Signed   By: Ruel Favors M.D.   On: 04/16/2013 08:06    Medications: . antiseptic oral rinse  15 mL Mouth Rinse q12n4p  . chlorhexidine  15 mL Mouth Rinse BID  . enoxaparin (LOVENOX) injection  50 mg Subcutaneous QHS  . insulin aspart  0-15 Units Subcutaneous Q4H  . latanoprost  1 drop Both Eyes QHS  . loratadine  10 mg Oral Daily  . pantoprazole  40 mg Oral Daily  . pregabalin  125 mg Oral BID  . timolol  1 drop Both Eyes Daily    Assessment/Plan Nausea, vomiting, abdominal pain, diarrhea  SBO vs ileus  Goiter  Peripheral neuropathy,  Monoclonal gammopathy  Body mass index is 30.3   Plan:  Full liquids   LOS: 3 days    Hulen Mandler 04/18/2013

## 2013-04-19 ENCOUNTER — Inpatient Hospital Stay (HOSPITAL_COMMUNITY): Payer: Medicare Other

## 2013-04-19 LAB — CBC
HCT: 36.6 % — ABNORMAL LOW (ref 39.0–52.0)
Hemoglobin: 13.1 g/dL (ref 13.0–17.0)
MCH: 31.6 pg (ref 26.0–34.0)
MCHC: 35.8 g/dL (ref 30.0–36.0)
MCV: 88.2 fL (ref 78.0–100.0)
RBC: 4.15 MIL/uL — ABNORMAL LOW (ref 4.22–5.81)

## 2013-04-19 LAB — BASIC METABOLIC PANEL
BUN: 8 mg/dL (ref 6–23)
CO2: 24 mEq/L (ref 19–32)
Chloride: 100 mEq/L (ref 96–112)
Glucose, Bld: 116 mg/dL — ABNORMAL HIGH (ref 70–99)
Potassium: 3.8 mEq/L (ref 3.5–5.1)
Sodium: 135 mEq/L (ref 135–145)

## 2013-04-19 MED ORDER — KCL IN DEXTROSE-NACL 40-5-0.9 MEQ/L-%-% IV SOLN
INTRAVENOUS | Status: DC
  Administered 2013-04-19: 100 mL via INTRAVENOUS
  Filled 2013-04-19 (×4): qty 1000

## 2013-04-19 NOTE — Progress Notes (Signed)
  Subjective: Ate post roast last night and stomach became distended, he has heart burn now.  He did have a couple BM's since supper.  He felt distended and vomited again this AM.  Objective: Vital signs in last 24 hours: Temp:  [97.5 F (36.4 C)-97.8 F (36.6 C)] 97.8 F (36.6 C) (11/19 0615) Pulse Rate:  [74-87] 87 (11/19 0615) Resp:  [18-20] 18 (11/19 0615) BP: (130-162)/(77-83) 130/83 mmHg (11/19 0615) SpO2:  [95 %-98 %] 95 % (11/19 0615) Last BM Date: 04/18/13 960 po Afebrile,  Labs OK Intake/Output from previous day: 11/18 0701 - 11/19 0700 In: 1360 [P.O.:960; I.V.:400] Out: 3600 [Urine:3600] Intake/Output this shift:    General appearance: alert, cooperative, no distress and complaining of heart burn, since vomiting just feels bad. GI: he's still  Distended some, +BS, +BM  Lab Results:   Recent Labs  04/19/13 0354  WBC 8.9  HGB 13.1  HCT 36.6*  PLT 241    BMET  Recent Labs  09-May-2013 0840 04/19/13 0354  NA 138 135  K 3.5 3.8  CL 105 100  CO2 23 24  GLUCOSE 104* 116*  BUN 17 8  CREATININE 0.83 0.90  CALCIUM 9.0 9.6   PT/INR No results found for this basename: LABPROT, INR,  in the last 72 hours   Recent Labs Lab 04/15/13 1425 04/16/13 0426  AST 22 13  ALT 31 21  ALKPHOS 71 54  BILITOT 0.8 0.5  PROT 7.7 5.9*  ALBUMIN 4.0 3.0*     Lipase     Component Value Date/Time   LIPASE 48 04/15/2013 1425     Studies/Results: Dg Abd 2 Views  05-09-13   CLINICAL DATA:  History of small bowel obstruction.  EXAM: ABDOMEN - 2 VIEW  COMPARISON:  04/16/2013  FINDINGS: A component of decompression is identified within previously described dilated loops of small bowel. A small residual focus of dilated loops of small bowel with air-fluid levels project within the left upper quadrant. The colon is decompressed containing residual barium. Visualized osseous structures are unremarkable. The patient's NG tube is been removed in the interim  IMPRESSION: 1.  Radiographic findings consistent with a resolving small bowel obstruction surveillance evaluation recommended if and as clinically warranted.   Electronically Signed   By: Salome Holmes M.D.   On: 05-09-2013 09:30    Medications: . antiseptic oral rinse  15 mL Mouth Rinse q12n4p  . chlorhexidine  15 mL Mouth Rinse BID  . enoxaparin (LOVENOX) injection  50 mg Subcutaneous QHS  . latanoprost  1 drop Both Eyes QHS  . loratadine  10 mg Oral Daily  . pantoprazole  40 mg Oral Daily  . pregabalin  125 mg Oral BID  . timolol  1 drop Both Eyes Daily    Assessment/Plan Nausea, vomiting, abdominal pain, diarrhea  SBO vs ileus  Goiter  Peripheral neuropathy,  Monoclonal gammopathy  Body mass index is 30.3   Plan:  No surgical obstruction,  K+ ok, WBC normal.  His wife says he vomited a large amount of green colored fluid.  I am going to repeat his film and put him back on clears for now.  His IV is not working and this may have to be replaced.   LOS: 4 days    Daniel Whitaker 04/19/2013

## 2013-04-19 NOTE — Progress Notes (Signed)
Patient seen and examined.  Agree with PA's note.  If his x-ray looks significantly worse then he will need and ng tube and possible surgery tomorrow.

## 2013-04-19 NOTE — Progress Notes (Signed)
Verbal order from PA [facility] that Protonix can be given early Stanford Breed RN 04-19-2013 7:34am

## 2013-04-19 NOTE — Progress Notes (Signed)
[facility] PROGRESS NOTE  Daniel Whitaker Sr. ZOX:[id number] DOB: 1945-12-02 DOA: 04/15/2013 PCP: [name], MD  Assessment/Plan: 1. SBO vs ileus: Had abdominal pain, n/v following first solid meal yesterday.  Agree with reverting back to liquids and ordering abdominal films. 2. MGUS 1. F/u OP with heme/onc. 3. Neuropathy 1. Cont current regimen 4. Hyperglycemia 1. On SSI 2. CBGs well controleed. 5. Hypokalemia 1. Repleted. 2. Follow given ileus vs SBO.  Code Status: Full Family Communication: Patient only. Disposition Plan: Home when medically stable  Consultants:  General Surgery  HPI/Subjective: Emesis and abdominal pain following pot roast dinner. Has had 2 BMs since last night.  Objective: Filed Vitals:   04/18/13 1010 04/18/13 1400 04/18/13 2240 04/19/13 0615  BP: 152/80 150/79 136/77 130/83  Pulse: 76 74 77 87  Temp:  97.5 F (36.4 C) 97.8 F (36.6 C) 97.8 F (36.6 C)  TempSrc:  Oral Oral Oral  Resp: 18 18 18 18   Height:      Weight:      SpO2:  98% 96% 95%    Intake/Output Summary (Last 24 hours) at 04/19/13 1015 Last data filed at 04/19/13 0600  Gross per 24 hour  Intake    720 ml  Output   2600 ml  Net  -1880 ml   Filed Weights   04/15/13 2000  Weight: 104.327 kg (230 lb)   Exam:  General:  Awake, in nad  Cardiovascular: regular, s1, s2  Respiratory: normal resp effort, no wheezing  Abdomen: soft, nondistended  Musculoskeletal: perfused, no clubbing   Data Reviewed: Basic Metabolic Panel:  Recent Labs Lab 04/15/13 1425 04/16/13 0426 04/17/13 0840 04/19/13 0354  NA 138 139 138 135  K 3.2* 3.2* 3.5 3.8  CL 102 107 105 100  CO2 22 24 23 24   GLUCOSE 131* 94 104* 116*  BUN 28* 25* 17 8  CREATININE 0.90 0.89 0.83 0.90  CALCIUM 9.4 8.4 9.0 9.6   Liver Function Tests:  Recent Labs Lab 04/15/13 1425 04/16/13 0426  AST 22 13  ALT 31 21  ALKPHOS 71 54  BILITOT 0.8 0.5  PROT 7.7 5.9*  ALBUMIN 4.0 3.0*     Recent Labs Lab 04/15/13 1425  LIPASE 48   No results found for this basename: AMMONIA,  in the last 168 hours CBC:  Recent Labs Lab 04/15/13 1425 04/16/13 0426 04/19/13 0354  WBC 7.4 5.9 8.9  NEUTROABS 5.1  --   --   HGB 15.2 12.1* 13.1  HCT 42.2 34.2* 36.6*  MCV 87.9 90.7 88.2  PLT 227 178 241   Cardiac Enzymes: No results found for this basename: CKTOTAL, CKMB, CKMBINDEX, TROPONINI,  in the last 168 hours BNP (last 3 results) No results found for this basename: PROBNP,  in the last 8760 hours CBG:  Recent Labs Lab 04/17/13 1627 04/17/13 1945 04/17/13 2333 04/18/13 0431 04/18/13 0740  GLUCAP 106* 119* 111* 102* 112*    No results found for this or any previous visit (from the past 240 hour(s)).   Studies: No results found.  Scheduled Meds: . antiseptic oral rinse  15 mL Mouth Rinse q12n4p  . chlorhexidine  15 mL Mouth Rinse BID  . enoxaparin (LOVENOX) injection  50 mg Subcutaneous QHS  . latanoprost  1 drop Both Eyes QHS  . loratadine  10 mg Oral Daily  . pantoprazole  40 mg Oral Daily  . pregabalin  125 mg Oral BID  . timolol  1 drop Both Eyes Daily  Continuous Infusions:    Active Problems:   SBO (small bowel obstruction)   Hypokalemia   Hyperglycemia   Neuropathy   Abdominal pain   Vomiting  Time spent:  HERNANDEZ ACOSTA,Crispin Vogel  [facility] Pager [phone] . If 7PM-7AM, please contact night-coverage at [email or web address], password [email or web address] 04/19/2013, 10:15 AM  LOS: 4 days

## 2013-04-20 ENCOUNTER — Inpatient Hospital Stay (HOSPITAL_COMMUNITY): Payer: Medicare Other

## 2013-04-20 LAB — BASIC METABOLIC PANEL
BUN: 13 mg/dL (ref 6–23)
Calcium: 8.7 mg/dL (ref 8.4–10.5)
Chloride: 105 mEq/L (ref 96–112)
Creatinine, Ser: 1.01 mg/dL (ref 0.50–1.35)
GFR calc Af Amer: 87 mL/min — ABNORMAL LOW (ref >90)
Glucose, Bld: 105 mg/dL — ABNORMAL HIGH (ref 70–99)

## 2013-04-20 LAB — PROTIME-INR: Prothrombin Time: 14.4 seconds (ref 11.6–15.2)

## 2013-04-20 LAB — CBC
HCT: 36.2 % — ABNORMAL LOW (ref 39.0–52.0)
Hemoglobin: 12.3 g/dL — ABNORMAL LOW (ref 13.0–17.0)
MCH: 30.7 pg (ref 26.0–34.0)
MCHC: 34 g/dL (ref 30.0–36.0)
MCV: 90.3 fL (ref 78.0–100.0)
RDW: 12.5 % (ref 11.5–15.5)
WBC: 7 10*3/uL (ref 4.0–10.5)

## 2013-04-20 MED ORDER — ACETAMINOPHEN 325 MG PO TABS
650.0000 mg | ORAL_TABLET | ORAL | Status: DC | PRN
  Administered 2013-04-20: 650 mg via ORAL
  Filled 2013-04-20: qty 2

## 2013-04-20 NOTE — Progress Notes (Signed)
Will Marlyne Beards, PA aware pt had complained of dull H/A earlier and requested Tylenol. Will aware pt doesn't want IV to be restarted. See new orders received.

## 2013-04-20 NOTE — Progress Notes (Signed)
[facility] PROGRESS NOTE  Daniel KLEY Sr. ZOX:[id number] DOB: 04-May-1946 DOA: 04/15/2013 PCP: [name], MD  Assessment/Plan: 1. SBO vs ileus: Had abdominal pain, n/v following first solid meal yesterday.  Repeat abdominal films show persistent but improving SBO. Patient is clinically improving. Surgery has advanced diet to full liquids today. Continue to follow and advance diet as appropriate. 2. MGUS 1. F/u OP with heme/onc. 3. Neuropathy 1. Cont current regimen 4. Hyperglycemia 1. On SSI 2. CBGs well controleed. 5. Hypokalemia 1. Repleted. 2. Follow given ileus vs SBO.  Code Status: Full Family Communication: Patient only. Disposition Plan: Home when medically stable  Consultants:  General Surgery  HPI/Subjective: Feels better today.  Objective: Filed Vitals:   04/19/13 0615 04/19/13 1400 04/19/13 2200 04/20/13 0630  BP: 130/83 113/64 133/69 106/58  Pulse: 87 87 79 66  Temp: 97.8 F (36.6 C) 98.2 F (36.8 C) 98 F (36.7 C) 97.6 F (36.4 C)  TempSrc: Oral Oral Oral Oral  Resp: 18 18 18    Height:      Weight:      SpO2: 95% 94% 93% 98%    Intake/Output Summary (Last 24 hours) at 04/20/13 1322 Last data filed at 04/20/13 1000  Gross per 24 hour  Intake 1291.67 ml  Output   1100 ml  Net 191.67 ml   Filed Weights   04/15/13 2000  Weight: 104.327 kg (230 lb)   Exam:  General:  Awake, in nad  Cardiovascular: regular, s1, s2  Respiratory: normal resp effort, no wheezing  Abdomen: soft, nondistended  Musculoskeletal: perfused, no clubbing   Data Reviewed: Basic Metabolic Panel:  Recent Labs Lab 04/15/13 1425 04/16/13 0426 04/17/13 0840 04/19/13 0354 04/20/13 0430  NA 138 139 138 135 138  K 3.2* 3.2* 3.5 3.8 4.0  CL 102 107 105 100 105  CO2 22 24 23 24 23   GLUCOSE 131* 94 104* 116* 105*  BUN 28* 25* 17 8 13   CREATININE 0.90 0.89 0.83 0.90 1.01  CALCIUM 9.4 8.4 9.0 9.6 8.7   Liver Function Tests:  Recent Labs Lab  04/15/13 1425 04/16/13 0426  AST 22 13  ALT 31 21  ALKPHOS 71 54  BILITOT 0.8 0.5  PROT 7.7 5.9*  ALBUMIN 4.0 3.0*    Recent Labs Lab 04/15/13 1425  LIPASE 48   No results found for this basename: AMMONIA,  in the last 168 hours CBC:  Recent Labs Lab 04/15/13 1425 04/16/13 0426 04/19/13 0354 04/20/13 0430  WBC 7.4 5.9 8.9 7.0  NEUTROABS 5.1  --   --   --   HGB 15.2 12.1* 13.1 12.3*  HCT 42.2 34.2* 36.6* 36.2*  MCV 87.9 90.7 88.2 90.3  PLT 227 178 241 216   Cardiac Enzymes: No results found for this basename: CKTOTAL, CKMB, CKMBINDEX, TROPONINI,  in the last 168 hours BNP (last 3 results) No results found for this basename: PROBNP,  in the last 8760 hours CBG:  Recent Labs Lab 04/17/13 1627 04/17/13 1945 04/17/13 2333 04/18/13 0431 04/18/13 0740  GLUCAP 106* 119* 111* 102* 112*    No results found for this or any previous visit (from the past 240 hour(s)).   Studies: Dg Abd 2 Views  04/19/2013   CLINICAL DATA:  Small bowel obstruction  EXAM: ABDOMEN - 2 VIEW  COMPARISON:  04/17/2013  FINDINGS: Gaseous distention of small bowel loops in mid in upper abdomen up to 6.5 cm diameter.  Small amount get colonic gas is present.  Findings remain consistent  with small bowel obstruction.  Previously identified residual contrast in the distal colon has cleared.  No definite bowel wall thickening or free intraperitoneal air.  Bones unremarkable.  No urinary tract calcification.  IMPRESSION: Persistent small bowel obstruction although slightly increased gas is seen within the colon.   Electronically Signed   By: Ulyses Southward M.D.   On: 04/19/2013 13:28   Dg Abd Acute W/chest  04/20/2013   CLINICAL DATA:  Partial small bowel obstruction, followup  EXAM: ACUTE ABDOMEN SERIES (ABDOMEN 2 VIEW & CHEST 1 VIEW)  COMPARISON:  Abdomen films of 04/2018 and 04/17/2013 and CT of the chest of 01/11/2009  FINDINGS: There is mild bibasilar linear atelectasis present. No infiltrate or  effusion is seen. Mediastinal contours appear normal. The heart is within upper limits of normal.  Supine and erect views the abdomen show some improvement in the degree of gaseous distention of small bowel. There is still slight gaseous distension present with air-fluid levels consistent with a persistent partial small bowel obstruction. No free air is seen on the erect view.  IMPRESSION: 1. Some improvement in the gaseous distention of small bowel, but persistent partial small bowel obstruction. 2. Mild bibasilar linear atelectasis.   Electronically Signed   By: Dwyane Dee M.D.   On: 04/20/2013 08:20    Scheduled Meds: . antiseptic oral rinse  15 mL Mouth Rinse q12n4p  . chlorhexidine  15 mL Mouth Rinse BID  . enoxaparin (LOVENOX) injection  50 mg Subcutaneous QHS  . latanoprost  1 drop Both Eyes QHS  . loratadine  10 mg Oral Daily  . pantoprazole  40 mg Oral Daily  . pregabalin  125 mg Oral BID  . timolol  1 drop Both Eyes Daily   Continuous Infusions:    Active Problems:   SBO (small bowel obstruction)   Hypokalemia   Hyperglycemia   Neuropathy   Abdominal pain   Vomiting  Time spent:  HERNANDEZ ACOSTA,ESTELA  [facility] Pager [phone] . If 7PM-7AM, please contact night-coverage at [email or web address], password [facility] 04/20/2013, 1:22 PM  LOS: 5 days

## 2013-04-20 NOTE — Progress Notes (Signed)
  Subjective: He feels better this AM.  He has 2 Bm's yesterday and a lot of gas this Am with "a couple giblets this Am."  Objective: Vital signs in last 24 hours: Temp:  [97.6 F (36.4 C)-98.2 F (36.8 C)] 97.6 F (36.4 C) (11/20 0630) Pulse Rate:  [66-87] 66 (11/20 0630) Resp:  [18] 18 2023/04/30 2200) BP: (106-133)/(58-69) 106/58 mmHg (11/20 0630) SpO2:  [93 %-98 %] 98 % (11/20 0630) Last BM Date: 2013/04/29 1 BM recorded, nothing PO recorded. Afebrile, VSS Labs OK Diet: clears Film shows some improvement, but still some SB distension, with air fluid levels, no free air. Intake/Output from previous day: 30-Apr-2023 0701 - 11/20 0700 In: 1291.7 [I.V.:1291.7] Out: 1050 [Urine:1050] Intake/Output this shift:    General appearance: alert, cooperative and no distress GI: soft, less distension this AM, + BS, and +BM  Lab Results:   Recent Labs  04-29-2013 0354 04/20/13 0430  WBC 8.9 7.0  HGB 13.1 12.3*  HCT 36.6* 36.2*  PLT 241 216    BMET  Recent Labs  29-Apr-2013 0354 04/20/13 0430  NA 135 138  K 3.8 4.0  CL 100 105  CO2 24 23  GLUCOSE 116* 105*  BUN 8 13  CREATININE 0.90 1.01  CALCIUM 9.6 8.7   PT/INR  Recent Labs  04/20/13 0430  LABPROT 14.4  INR 1.14     Recent Labs Lab 04/15/13 1425 04/16/13 0426  AST 22 13  ALT 31 21  ALKPHOS 71 54  BILITOT 0.8 0.5  PROT 7.7 5.9*  ALBUMIN 4.0 3.0*     Lipase     Component Value Date/Time   LIPASE 48 04/15/2013 1425     Studies/Results: Dg Abd 2 Views  29-Apr-2013   CLINICAL DATA:  Small bowel obstruction  EXAM: ABDOMEN - 2 VIEW  COMPARISON:  04/17/2013  FINDINGS: Gaseous distention of small bowel loops in mid in upper abdomen up to 6.5 cm diameter.  Small amount get colonic gas is present.  Findings remain consistent with small bowel obstruction.  Previously identified residual contrast in the distal colon has cleared.  No definite bowel wall thickening or free intraperitoneal air.  Bones unremarkable.  No  urinary tract calcification.  IMPRESSION: Persistent small bowel obstruction although slightly increased gas is seen within the colon.   Electronically Signed   By: Ulyses Southward M.D.   On: 04/29/13 13:28    Medications: . antiseptic oral rinse  15 mL Mouth Rinse q12n4p  . chlorhexidine  15 mL Mouth Rinse BID  . enoxaparin (LOVENOX) injection  50 mg Subcutaneous QHS  . latanoprost  1 drop Both Eyes QHS  . loratadine  10 mg Oral Daily  . pantoprazole  40 mg Oral Daily  . pregabalin  125 mg Oral BID  . timolol  1 drop Both Eyes Daily    Assessment/Plan Nausea, vomiting, abdominal pain, diarrhea  Partial SBO vs ileus  Goiter  Peripheral neuropathy,  Monoclonal gammopathy  Body mass index is 30.3   Plan:  His K+ has been replaced, he is doing better, but still has PSBO on Xray. Clinically he looks much better. We plan to go back to full liquids.  His IV is out and they are having trouble with getting IV's in him.  I will leave it out for now.  LOS: 5 days    Tyeasha Ebbs 04/20/2013

## 2013-04-20 NOTE — Progress Notes (Signed)
He clinically and radiographically improved in the last 24 hours. We will slowly advance his diet and monitor his clinical course and x-rays.

## 2013-04-21 ENCOUNTER — Inpatient Hospital Stay (HOSPITAL_COMMUNITY): Payer: Medicare Other | Admitting: Anesthesiology

## 2013-04-21 ENCOUNTER — Encounter (HOSPITAL_COMMUNITY): Payer: Self-pay | Admitting: Certified Registered Nurse Anesthetist

## 2013-04-21 ENCOUNTER — Inpatient Hospital Stay (HOSPITAL_COMMUNITY): Payer: Medicare Other

## 2013-04-21 ENCOUNTER — Encounter (HOSPITAL_COMMUNITY): Admission: EM | Disposition: A | Discharge status: Home / Self Care | Payer: Self-pay | Source: Home / Self Care

## 2013-04-21 ENCOUNTER — Encounter (HOSPITAL_COMMUNITY): Payer: Medicare Other | Admitting: Anesthesiology

## 2013-04-21 HISTORY — PX: COLON RESECTION: SHX5231

## 2013-04-21 SURGERY — COLON RESECTION LAPAROSCOPIC
Anesthesia: General | Site: Abdomen | Wound class: Clean

## 2013-04-21 MED ORDER — SUCCINYLCHOLINE CHLORIDE 20 MG/ML IJ SOLN
INTRAMUSCULAR | Status: DC | PRN
  Administered 2013-04-21: 140 mg via INTRAVENOUS

## 2013-04-21 MED ORDER — HYDROMORPHONE HCL PF 1 MG/ML IJ SOLN: 0.2500 mg | INTRAMUSCULAR | Status: DC | PRN

## 2013-04-21 MED ORDER — MIDAZOLAM HCL 2 MG/2ML IJ SOLN
INTRAMUSCULAR | Status: AC
  Filled 2013-04-21: qty 2

## 2013-04-21 MED ORDER — PHENOL 1.4 % MT LIQD
1.0000 | OROMUCOSAL | Status: DC | PRN
  Filled 2013-04-21: qty 177

## 2013-04-21 MED ORDER — PROPOFOL 10 MG/ML IV BOLUS
INTRAVENOUS | Status: DC | PRN
  Administered 2013-04-21: 200 mg via INTRAVENOUS

## 2013-04-21 MED ORDER — PANTOPRAZOLE SODIUM 40 MG IV SOLR
40.0000 mg | Freq: Every day | INTRAVENOUS | Status: DC
  Administered 2013-04-21 – 2013-04-24 (×4): 40 mg via INTRAVENOUS
  Filled 2013-04-21 (×7): qty 40

## 2013-04-21 MED ORDER — KETOROLAC TROMETHAMINE 30 MG/ML IJ SOLN: 15.0000 mg | Freq: Once | INTRAMUSCULAR | Status: DC | PRN

## 2013-04-21 MED ORDER — MIDAZOLAM HCL 5 MG/5ML IJ SOLN
INTRAMUSCULAR | Status: DC | PRN
  Administered 2013-04-21: 2 mg via INTRAVENOUS

## 2013-04-21 MED ORDER — LACTATED RINGERS IV SOLN
INTRAVENOUS | Status: DC | PRN
  Administered 2013-04-21 (×2): via INTRAVENOUS

## 2013-04-21 MED ORDER — DEXAMETHASONE SODIUM PHOSPHATE 10 MG/ML IJ SOLN
INTRAMUSCULAR | Status: AC
  Filled 2013-04-21: qty 1

## 2013-04-21 MED ORDER — DEXAMETHASONE SODIUM PHOSPHATE 10 MG/ML IJ SOLN
INTRAMUSCULAR | Status: DC | PRN
  Administered 2013-04-21: 10 mg via INTRAVENOUS

## 2013-04-21 MED ORDER — CEFAZOLIN SODIUM-DEXTROSE 2-3 GM-% IV SOLR
1.0000 g | INTRAVENOUS | Status: AC
  Administered 2013-04-21: 2 g via INTRAVENOUS

## 2013-04-21 MED ORDER — FENTANYL CITRATE 0.05 MG/ML IJ SOLN
INTRAMUSCULAR | Status: AC
  Filled 2013-04-21: qty 5

## 2013-04-21 MED ORDER — PROPOFOL 10 MG/ML IV BOLUS
INTRAVENOUS | Status: AC
  Filled 2013-04-21: qty 20

## 2013-04-21 MED ORDER — HYDROMORPHONE HCL PF 1 MG/ML IJ SOLN
INTRAMUSCULAR | Status: DC | PRN
  Administered 2013-04-21 (×2): 1 mg via INTRAVENOUS

## 2013-04-21 MED ORDER — ROCURONIUM BROMIDE 100 MG/10ML IV SOLN
INTRAVENOUS | Status: DC | PRN
  Administered 2013-04-21: 50 mg via INTRAVENOUS

## 2013-04-21 MED ORDER — 0.9 % SODIUM CHLORIDE (POUR BTL) OPTIME
TOPICAL | Status: DC | PRN
  Administered 2013-04-21: 1000 mL

## 2013-04-21 MED ORDER — PROMETHAZINE HCL 25 MG/ML IJ SOLN: 6.2500 mg | INTRAMUSCULAR | Status: DC | PRN

## 2013-04-21 MED ORDER — MORPHINE SULFATE 2 MG/ML IJ SOLN
2.0000 mg | INTRAMUSCULAR | Status: DC | PRN
  Administered 2013-04-21 – 2013-04-22 (×3): 2 mg via INTRAVENOUS
  Filled 2013-04-21 (×3): qty 1

## 2013-04-21 MED ORDER — HYDROMORPHONE HCL PF 2 MG/ML IJ SOLN
INTRAMUSCULAR | Status: AC
  Filled 2013-04-21: qty 1

## 2013-04-21 MED ORDER — LACTATED RINGERS IR SOLN
Status: DC | PRN
  Administered 2013-04-21: 1000 mL

## 2013-04-21 MED ORDER — BUPIVACAINE HCL (PF) 0.5 % IJ SOLN
INTRAMUSCULAR | Status: DC | PRN
  Administered 2013-04-21: 10 mL

## 2013-04-21 MED ORDER — LIDOCAINE HCL (CARDIAC) 20 MG/ML IV SOLN
INTRAVENOUS | Status: DC | PRN
  Administered 2013-04-21: 80 mg via INTRAVENOUS

## 2013-04-21 MED ORDER — CEFAZOLIN SODIUM-DEXTROSE 2-3 GM-% IV SOLR
INTRAVENOUS | Status: AC
  Filled 2013-04-21: qty 50

## 2013-04-21 MED ORDER — LIDOCAINE HCL 2 % EX GEL
Freq: Once | CUTANEOUS | Status: AC
  Administered 2013-04-21: 09:00:00
  Filled 2013-04-21: qty 5

## 2013-04-21 MED ORDER — ROCURONIUM BROMIDE 100 MG/10ML IV SOLN
INTRAVENOUS | Status: AC
  Filled 2013-04-21: qty 1

## 2013-04-21 MED ORDER — GLYCOPYRROLATE 0.2 MG/ML IJ SOLN
INTRAMUSCULAR | Status: DC | PRN
  Administered 2013-04-21: 0.6 mg via INTRAVENOUS

## 2013-04-21 MED ORDER — LACTATED RINGERS IV SOLN
INTRAVENOUS | Status: DC
  Administered 2013-04-21: 1000 mL via INTRAVENOUS

## 2013-04-21 MED ORDER — NEOSTIGMINE METHYLSULFATE 1 MG/ML IJ SOLN
INTRAMUSCULAR | Status: AC
  Filled 2013-04-21: qty 10

## 2013-04-21 MED ORDER — BUPIVACAINE HCL (PF) 0.5 % IJ SOLN
INTRAMUSCULAR | Status: AC
  Filled 2013-04-21: qty 30

## 2013-04-21 MED ORDER — FENTANYL CITRATE 0.05 MG/ML IJ SOLN
INTRAMUSCULAR | Status: DC | PRN
  Administered 2013-04-21: 100 ug via INTRAVENOUS
  Administered 2013-04-21: 50 ug via INTRAVENOUS
  Administered 2013-04-21: 100 ug via INTRAVENOUS

## 2013-04-21 MED ORDER — NEOSTIGMINE METHYLSULFATE 1 MG/ML IJ SOLN
INTRAMUSCULAR | Status: DC | PRN
  Administered 2013-04-21: 5 mg via INTRAVENOUS

## 2013-04-21 MED ORDER — KCL-LACTATED RINGERS-D5W 20 MEQ/L IV SOLN
INTRAVENOUS | Status: DC
  Administered 2013-04-21 – 2013-04-22 (×2): via INTRAVENOUS
  Administered 2013-04-23: 100 mL/h via INTRAVENOUS
  Administered 2013-04-23 – 2013-04-24 (×3): via INTRAVENOUS
  Filled 2013-04-21 (×9): qty 1000

## 2013-04-21 MED ORDER — GLYCOPYRROLATE 0.2 MG/ML IJ SOLN
INTRAMUSCULAR | Status: AC
  Filled 2013-04-21: qty 3

## 2013-04-21 MED ORDER — ONDANSETRON HCL 4 MG/2ML IJ SOLN
INTRAMUSCULAR | Status: AC
  Filled 2013-04-21: qty 2

## 2013-04-21 MED ORDER — ONDANSETRON HCL 4 MG/2ML IJ SOLN
INTRAMUSCULAR | Status: DC | PRN
  Administered 2013-04-21: 4 mg via INTRAVENOUS

## 2013-04-21 MED ORDER — LIDOCAINE HCL (CARDIAC) 20 MG/ML IV SOLN
INTRAVENOUS | Status: AC
  Filled 2013-04-21: qty 5

## 2013-04-21 SURGICAL SUPPLY — 66 items
APPLIER CLIP 5 13 M/L LIGAMAX5 (MISCELLANEOUS)
APPLIER CLIP ROT 10 11.4 M/L (STAPLE)
BLADE EXTENDED COATED 6.5IN (ELECTRODE) IMPLANT
BLADE HEX COATED 2.75 (ELECTRODE) ×2 IMPLANT
BLADE SURG SZ10 CARB STEEL (BLADE) ×2 IMPLANT
CABLE HIGH FREQUENCY MONO STRZ (ELECTRODE) ×2 IMPLANT
CANISTER SUCTION 2500CC (MISCELLANEOUS) ×2 IMPLANT
CELLS DAT CNTRL 66122 CELL SVR (MISCELLANEOUS) IMPLANT
CLIP APPLIE 5 13 M/L LIGAMAX5 (MISCELLANEOUS) IMPLANT
CLIP APPLIE ROT 10 11.4 M/L (STAPLE) IMPLANT
COVER MAYO STAND STRL (DRAPES) ×2 IMPLANT
DECANTER SPIKE VIAL GLASS SM (MISCELLANEOUS) ×2 IMPLANT
DISSECTOR BLUNT TIP ENDO 5MM (MISCELLANEOUS) IMPLANT
DRAPE LAPAROSCOPIC ABDOMINAL (DRAPES) ×2 IMPLANT
DRAPE LG THREE QUARTER DISP (DRAPES) IMPLANT
DRAPE UTILITY XL STRL (DRAPES) ×2 IMPLANT
DRAPE WARM FLUID 44X44 (DRAPE) IMPLANT
ELECT REM PT RETURN 9FT ADLT (ELECTROSURGICAL) ×2
ELECTRODE REM PT RTRN 9FT ADLT (ELECTROSURGICAL) ×1 IMPLANT
FILTER SMOKE EVAC LAPAROSHD (FILTER) IMPLANT
GLOVE BIOGEL PI IND STRL 7.0 (GLOVE) ×1 IMPLANT
GLOVE BIOGEL PI INDICATOR 7.0 (GLOVE) ×1
GLOVE ECLIPSE 8.0 STRL XLNG CF (GLOVE) ×4 IMPLANT
GLOVE INDICATOR 8.0 STRL GRN (GLOVE) ×4 IMPLANT
GOWN PREVENTION PLUS LG XLONG (DISPOSABLE) ×2 IMPLANT
GOWN STRL REIN XL XLG (GOWN DISPOSABLE) ×4 IMPLANT
KIT BASIN OR (CUSTOM PROCEDURE TRAY) ×2 IMPLANT
LEGGING LITHOTOMY PAIR STRL (DRAPES) IMPLANT
LIGASURE IMPACT 36 18CM CVD LR (INSTRUMENTS) IMPLANT
NS IRRIG 1000ML POUR BTL (IV SOLUTION) ×4 IMPLANT
PENCIL BUTTON HOLSTER BLD 10FT (ELECTRODE) ×2 IMPLANT
RTRCTR WOUND ALEXIS 18CM MED (MISCELLANEOUS)
SCALPEL HARMONIC ACE (MISCELLANEOUS) IMPLANT
SCISSORS LAP 5X35 DISP (ENDOMECHANICALS) ×2 IMPLANT
SET IRRIG TUBING LAPAROSCOPIC (IRRIGATION / IRRIGATOR) IMPLANT
SLEEVE XCEL OPT CAN 5 100 (ENDOMECHANICALS) IMPLANT
SOLUTION ANTI FOG 6CC (MISCELLANEOUS) ×2 IMPLANT
SPONGE GAUZE 4X4 12PLY (GAUZE/BANDAGES/DRESSINGS) ×2 IMPLANT
SPONGE LAP 18X18 X RAY DECT (DISPOSABLE) ×2 IMPLANT
STAPLER VISISTAT 35W (STAPLE) IMPLANT
SUCTION POOLE TIP (SUCTIONS) IMPLANT
SUT MNCRL AB 4-0 PS2 18 (SUTURE) ×2 IMPLANT
SUT PDS AB 1 CTX 36 (SUTURE) IMPLANT
SUT PDS AB 1 TP1 96 (SUTURE) IMPLANT
SUT PROLENE 2 0 SH DA (SUTURE) IMPLANT
SUT SILK 2 0 (SUTURE)
SUT SILK 2 0 SH CR/8 (SUTURE) IMPLANT
SUT SILK 2-0 18XBRD TIE 12 (SUTURE) IMPLANT
SUT SILK 3 0 (SUTURE)
SUT SILK 3 0 SH CR/8 (SUTURE) ×2 IMPLANT
SUT SILK 3-0 18XBRD TIE 12 (SUTURE) IMPLANT
SUT VICRYL 2 0 18  UND BR (SUTURE)
SUT VICRYL 2 0 18 UND BR (SUTURE) IMPLANT
SYS LAPSCP GELPORT 120MM (MISCELLANEOUS)
SYSTEM LAPSCP GELPORT 120MM (MISCELLANEOUS) IMPLANT
TOWEL OR 17X26 10 PK STRL BLUE (TOWEL DISPOSABLE) ×2 IMPLANT
TRAY FOLEY CATH 14FRSI W/METER (CATHETERS) ×2 IMPLANT
TRAY LAP CHOLE (CUSTOM PROCEDURE TRAY) ×2 IMPLANT
TROCAR BLADELESS OPT 5 100 (ENDOMECHANICALS) IMPLANT
TROCAR BLADELESS OPT 5 75 (ENDOMECHANICALS) ×4 IMPLANT
TROCAR XCEL BLUNT TIP 100MML (ENDOMECHANICALS) IMPLANT
TROCAR XCEL NON-BLD 11X100MML (ENDOMECHANICALS) IMPLANT
TROCAR XCEL UNIV SLVE 11M 100M (ENDOMECHANICALS) IMPLANT
TUBING INSUFFLATION 10FT LAP (TUBING) ×2 IMPLANT
YANKAUER SUCT BULB TIP 10FT TU (MISCELLANEOUS) ×2 IMPLANT
YANKAUER SUCT BULB TIP NO VENT (SUCTIONS) ×2 IMPLANT

## 2013-04-21 NOTE — Anesthesia Preprocedure Evaluation (Addendum)
Anesthesia Evaluation  Patient identified by MRN, date of birth, ID band Patient awake    Reviewed: Allergy & Precautions, H&P , NPO status , Patient's Chart, lab work & pertinent test results  Airway Mallampati: II TM Distance: >3 FB Neck ROM: Full    Dental no notable dental hx.    Pulmonary former smoker,  breath sounds clear to auscultation  Pulmonary exam normal       Cardiovascular negative cardio ROS  Rhythm:Regular Rate:Normal     Neuro/Psych negative neurological ROS  negative psych ROS   GI/Hepatic Neg liver ROS, GERD-  Medicated,  Endo/Other  Obesity   Renal/GU negative Renal ROS  negative genitourinary   Musculoskeletal negative musculoskeletal ROS (+)   Abdominal   Peds negative pediatric ROS (+)  Hematology  (+) anemia ,   Anesthesia Other Findings   Reproductive/Obstetrics negative OB ROS                          Anesthesia Physical Anesthesia Plan  ASA: II  Anesthesia Plan: General   Post-op Pain Management:    Induction: Intravenous, Rapid sequence and Cricoid pressure planned  Airway Management Planned: Oral ETT  Additional Equipment:   Intra-op Plan:   Post-operative Plan: Extubation in OR  Informed Consent: I have reviewed the patients History and Physical, chart, labs and discussed the procedure including the risks, benefits and alternatives for the proposed anesthesia with the patient or authorized representative who has indicated his/her understanding and acceptance.   Dental advisory given  Plan Discussed with: CRNA and Surgeon  Anesthesia Plan Comments:        Anesthesia Quick Evaluation

## 2013-04-21 NOTE — Progress Notes (Signed)
Patient seen and examined.  He feels more bloated and is passing less gas.  On exam, his abdomen is more distended with hypoactive bowel sounds.  X-rays demonstrate increased small bowel distension on my review.  Assess:  Partial SBO rather than ileus from gastroenteritis  Plan:  Diagnostic laparoscopy, possible exploratory laparotomy.  Will insert ng to lws, and restart IVF.  I have explained the procedure and risks of the operation.  Risks include but are not limited to bleeding, infection, wound problems, anesthesia, injury to intraabominal organs (such as intestine, spleen, kidney, bladder, ureter, etc.), ileus.  He seems to understand and agrees to proceed.

## 2013-04-21 NOTE — Progress Notes (Signed)
Subjective: He slept well last PM, no nausea, No BM and he's concerned about that.  Has now had BM this AM with some "straining."  He describes it as some normal followed by some loose stool.  Objective: Vital signs in last 24 hours: Temp:  [97.8 F (36.6 C)-97.9 F (36.6 C)] 97.9 F (36.6 C) (11/21 0601) Pulse Rate:  [59-76] 76 (11/21 0601) Resp:  [18] 18 (11/21 0601) BP: (102-138)/(66-85) 138/85 mmHg (11/21 0601) SpO2:  [94 %-95 %] 94 % (11/21 0601) Last BM Date: 04-24-2013 960 PO recorded No BM Tolerated fulls Intake/Output from previous day: 11/20 0701 - 11/21 0700 In: 960 [P.O.:960] Out: 3150 [Urine:3150] Intake/Output this shift:    General appearance: alert, cooperative and no distress GI: soft still a bit distended few Bs.  Lab Results:   Recent Labs  04/24/2013 0354 04/20/13 0430  WBC 8.9 7.0  HGB 13.1 12.3*  HCT 36.6* 36.2*  PLT 241 216    BMET  Recent Labs  04/24/13 0354 04/20/13 0430  NA 135 138  K 3.8 4.0  CL 100 105  CO2 24 23  GLUCOSE 116* 105*  BUN 8 13  CREATININE 0.90 1.01  CALCIUM 9.6 8.7   PT/INR  Recent Labs  04/20/13 0430  LABPROT 14.4  INR 1.14     Recent Labs Lab 04/15/13 1425 04/16/13 0426  AST 22 13  ALT 31 21  ALKPHOS 71 54  BILITOT 0.8 0.5  PROT 7.7 5.9*  ALBUMIN 4.0 3.0*     Lipase     Component Value Date/Time   LIPASE 48 04/15/2013 1425     Studies/Results: Dg Abd 2 Views  04/24/2013   CLINICAL DATA:  Small bowel obstruction  EXAM: ABDOMEN - 2 VIEW  COMPARISON:  04/17/2013  FINDINGS: Gaseous distention of small bowel loops in mid in upper abdomen up to 6.5 cm diameter.  Small amount get colonic gas is present.  Findings remain consistent with small bowel obstruction.  Previously identified residual contrast in the distal colon has cleared.  No definite bowel wall thickening or free intraperitoneal air.  Bones unremarkable.  No urinary tract calcification.  IMPRESSION: Persistent small bowel obstruction  although slightly increased gas is seen within the colon.   Electronically Signed   By: Ulyses Southward M.D.   On: 2013/04/24 13:28   Dg Abd Acute W/chest  04/20/2013   CLINICAL DATA:  Partial small bowel obstruction, followup  EXAM: ACUTE ABDOMEN SERIES (ABDOMEN 2 VIEW & CHEST 1 VIEW)  COMPARISON:  Abdomen films of 04/2018 and 04/17/2013 and CT of the chest of 01/11/2009  FINDINGS: There is mild bibasilar linear atelectasis present. No infiltrate or effusion is seen. Mediastinal contours appear normal. The heart is within upper limits of normal.  Supine and erect views the abdomen show some improvement in the degree of gaseous distention of small bowel. There is still slight gaseous distension present with air-fluid levels consistent with a persistent partial small bowel obstruction. No free air is seen on the erect view.  IMPRESSION: 1. Some improvement in the gaseous distention of small bowel, but persistent partial small bowel obstruction. 2. Mild bibasilar linear atelectasis.   Electronically Signed   By: Dwyane Dee M.D.   On: 04/20/2013 08:20    Medications: . antiseptic oral rinse  15 mL Mouth Rinse q12n4p  . chlorhexidine  15 mL Mouth Rinse BID  . enoxaparin (LOVENOX) injection  50 mg Subcutaneous QHS  . latanoprost  1 drop Both Eyes QHS  .  loratadine  10 mg Oral Daily  . pantoprazole  40 mg Oral Daily  . pregabalin  125 mg Oral BID  . timolol  1 drop Both Eyes Daily    Assessment/Plan Nausea, vomiting, abdominal pain, diarrhea  Partial SBO vs ileus  Goiter  Peripheral neuropathy,  Monoclonal gammopathy  Body mass index is 30.3   Plan:  Waiting on film, hope to advance to regular diet later this AM.  LOS: 6 days    Kimber Esterly 04/21/2013

## 2013-04-21 NOTE — Op Note (Signed)
Operative Note  Daniel WILKIE Sr. male 67 y.o. 04/21/2013  PREOPERATIVE DX:  Partial small bowel  POSTOPERATIVE DX:  Same due to adhesions  PROCEDURE:  Laparoscopic exploration with lysis of adhesions         Surgeon: Adolph Pollack   Assistants: Britt Bottom PA student  Anesthesia: General endotracheal anesthesia  Indications: This is a 67 year old man who was admitted was felt to be an ileus versus a possible partial small bowel obstruction. He's never had intra-abdominal surgery. He improved nonoperatively. However as his oral intake increased he became more distended and x-rays were consistent more with a partial small bowel obstruction. Because of that he is brought to the operating room for the above procedure.    Procedure Detail:  He was brought to the operating room placed supine on the operating table and a general anesthetic was given. A Foley catheter was inserted. Hair on the abdominal wall was closed. The abdominal was still prepped and draped.  He was placed in slight reverse Trendelenburg position. A 5 mm incision was made in the left subcostal area. Using a 5 mm Optiview trocar and laparoscope I gained access into the peritoneal cavity and create a pneumoperitoneum.  Inspection of the area under the trocar demonstrated no evidence of organ injury or bleeding. Under direct vision 25 mm trocars were placed in the left lateral abdomen.  The small bowel was diffusely dilated. I identified the ileocecal junction which demonstrated normal caliber small bowel. Approximately 8-10 cm proximal to this was a dense adhesion of the small bowel to the right lateral sidewall with an acute angulation. The bowel proximal to this was distended. The bowel distal to this was decompressed. This appeared to be the point of partial obstruction. Using sharp dissection I lysed this adhesion which relieved the obstruction. I then ran the bowel retrograde and did not see any other lesions or  points of obstruction. This was done all the way to the ligament of Treitz.  I then inspected the area of adhesion lysis and there is no evidence of bleeding or intestinal injury. The area was irrigated and fluid was evacuated. 4 quadrant inspection demonstrated no evidence of bleeding or organ injury.  The instruments were removed. The CO2 gas was released. The trocars were removed.  Skin incisions were closed with 4-0 Monocryl subcuticular stitches. Steri-Strips and sterile dressings were applied. He tolerated the procedure without apparent complications and was taken to recovery in satisfactory condition.    Findings: Right lower quadrant adhesions leading to partial small bowel obstruction  Estimated Blood Loss:  150 ml         Drains: none  Blood Given: none          Specimens: None        Complications:  * No complications entered in OR log *         Disposition: PACU - hemodynamically stable.         Condition: stable

## 2013-04-21 NOTE — Transfer of Care (Signed)
Immediate Anesthesia Transfer of Care Note  Patient: Daniel Handy Sr.  Procedure(s) Performed: Procedure(s): DIAGNOSTIC LAPAROSCOPY, lysis of adhesions for partial small bowel obstruction (N/A)  Patient Location: PACU  Anesthesia Type:General  Level of Consciousness: awake and alert   Airway & Oxygen Therapy: Patient Spontanous Breathing and Patient connected to face mask oxygen  Post-op Assessment: Report given to PACU RN and Post -op Vital signs reviewed and stable  Post vital signs: Reviewed and stable  Complications: No apparent anesthesia complications

## 2013-04-21 NOTE — Anesthesia Postprocedure Evaluation (Signed)
  Anesthesia Post-op Note  Patient: Daniel Handy Sr.  Procedure(s) Performed: Procedure(s) (LRB): DIAGNOSTIC LAPAROSCOPY, lysis of adhesions for partial small bowel obstruction (N/A)  Patient Location: PACU  Anesthesia Type: General  Level of Consciousness: awake and alert   Airway and Oxygen Therapy: Patient Spontanous Breathing  Post-op Pain: mild  Post-op Assessment: Post-op Vital signs reviewed, Patient's Cardiovascular Status Stable, Respiratory Function Stable, Patent Airway and No signs of Nausea or vomiting  Last Vitals:  Filed Vitals:   04/21/13 1445  BP: 146/128  Pulse: 77  Temp:   Resp: 17    Post-op Vital Signs: stable   Complications: No apparent anesthesia complications

## 2013-04-22 LAB — CREATININE, SERUM
Creatinine, Ser: 0.91 mg/dL (ref 0.50–1.35)
GFR calc Af Amer: >90 mL/min (ref >90)
GFR calc non Af Amer: 86 mL/min — ABNORMAL LOW (ref >90)

## 2013-04-22 MED ORDER — ACETAMINOPHEN 10 MG/ML IV SOLN
1000.0000 mg | Freq: Four times a day (QID) | INTRAVENOUS | Status: AC | PRN
  Administered 2013-04-22 – 2013-04-23 (×3): 1000 mg via INTRAVENOUS
  Filled 2013-04-22 (×3): qty 100

## 2013-04-22 MED ORDER — MENTHOL 3 MG MT LOZG
1.0000 | LOZENGE | OROMUCOSAL | Status: DC | PRN
  Filled 2013-04-22 (×3): qty 9

## 2013-04-22 MED ORDER — LORATADINE 10 MG PO TABS
10.0000 mg | ORAL_TABLET | Freq: Every day | ORAL | Status: DC
  Administered 2013-04-22 – 2013-04-24 (×3): 10 mg via ORAL
  Filled 2013-04-22 (×4): qty 1

## 2013-04-22 NOTE — Progress Notes (Signed)
1 Day Post-Op  Subjective: No abdominal pain.  No flatus.  Sore throat  Objective: Vital signs in last 24 hours: Temp:  [97.4 F (36.3 C)-98.1 F (36.7 C)] 97.7 F (36.5 C) (11/22 0605) Pulse Rate:  [63-89] 84 (11/22 0605) Resp:  [14-18] 18 (11/22 0605) BP: (132-177)/(68-128) 152/81 mmHg (11/22 0605) SpO2:  [92 %-100 %] 93 % (11/22 0605) Last BM Date: 2013-05-11  Intake/Output from previous day: 05-12-2023 0701 - 11/22 0700 In: 2000 [I.V.:2000] Out: 3360 [Urine:3360] Intake/Output this shift: Total I/O In: -  Out: 350 [Urine:350]  PE: General- In NAD Abdomen-soft, less distended than yesterday, no bowel sounds  Lab Results:   Recent Labs  04/20/13 0430  WBC 7.0  HGB 12.3*  HCT 36.2*  PLT 216   BMET  Recent Labs  04/20/13 0430 04/22/13 0512  NA 138  --   K 4.0  --   CL 105  --   CO2 23  --   GLUCOSE 105*  --   BUN 13  --   CREATININE 1.01 0.91  CALCIUM 8.7  --    PT/INR  Recent Labs  04/20/13 0430  LABPROT 14.4  INR 1.14   Comprehensive Metabolic Panel:    Component Value Date/Time   NA 138 04/20/2013 0430   K 4.0 04/20/2013 0430   CL 105 04/20/2013 0430   CO2 23 04/20/2013 0430   BUN 13 04/20/2013 0430   CREATININE 0.91 04/22/2013 0512   GLUCOSE 105* 04/20/2013 0430   CALCIUM 8.7 04/20/2013 0430   AST 13 04/16/2013 0426   ALT 21 04/16/2013 0426   ALKPHOS 54 04/16/2013 0426   BILITOT 0.5 04/16/2013 0426   PROT 5.9* 04/16/2013 0426   PROT 6.9 04/06/2013 1114   ALBUMIN 3.0* 04/16/2013 0426     Studies/Results: Dg Abd 2 Views  May 11, 2013   CLINICAL DATA:  Partial small bowel obstruction.  EXAM: ABDOMEN - 2 VIEW  COMPARISON:  04/20/2013.  FINDINGS: Increased number for gas distended small bowel loops now measuring up to 5.8 cm versus prior 4.6 cm. Findings consistent with small bowel obstruction which appears to be a partial obstruction as there is gas within portions of the colon.  Upper view does not completely include the right hemidiaphragm  to evaluate for the possibility of small amount of free intraperitoneal air. No obvious free intraperitoneal air noted.  IMPRESSION: Progressive gas distended small bowel loops consistent with partial small bowel obstruction.  Upper view does not completely include the right hemidiaphragm to evaluate for the possibility of small amount of free intraperitoneal air. No obvious free intraperitoneal air noted.   Electronically Signed   By: Bridgett Larsson M.D.   On: 2013-05-11 08:24    Anti-infectives: Anti-infectives   Start     Dose/Rate Route Frequency Ordered Stop   May 11, 2013 1200  ceFAZolin (ANCEF) IVPB 2 g/50 mL premix    Comments:  Give in OR holding.   1 g 50 mL/hr over 30 Minutes Intravenous On call 05/11/2013 0835 05-11-13 1259      Assessment Active Problems:   SBO (small bowel obstruction) s/p laparoscopic lysis of adhesions 11/22      LOS: 7 days   Plan: Ambulate.  Wait for return of bowel function.   Daniel Whitaker J 04/22/2013

## 2013-04-23 LAB — MRSA CULTURE

## 2013-04-23 NOTE — Progress Notes (Signed)
Patient ID: Daniel Handy Sr., male   DOB: 07-Apr-1946, 67 y.o.   MRN: [id number] 2 Days Post-Op  Subjective: Pt's only complaint is nose and throat pain.  Minimal NGT output.  Passing a lot of flatus.    Objective: Vital signs in last 24 hours: Temp:  [97.6 F (36.4 C)-98.1 F (36.7 C)] 97.6 F (36.4 C) (11/23 0530) Pulse Rate:  [73-82] 82 (11/23 0530) Resp:  [18] 18 (11/23 0530) BP: (143-181)/(78-87) 181/78 mmHg (11/23 0530) SpO2:  [92 %-93 %] 93 % (11/23 0530) Last BM Date: 04/21/13  Intake/Output from previous day: 11/22 0701 - 11/23 0700 In: 3663.3 [I.V.:3663.3] Out: 1680 [Urine:1650; Emesis/NG output:30] Intake/Output this shift:    PE: General- In NAD Abdomen-soft, minimally distended.  + bowel sounds.    Lab Results:  No results found for this basename: WBC, HGB, HCT, PLT,  in the last 72 hours BMET  Recent Labs  04/22/13 0512  CREATININE 0.91   PT/INR No results found for this basename: LABPROT, INR,  in the last 72 hours Comprehensive Metabolic Panel:    Component Value Date/Time   NA 138 04/20/2013 0430   K 4.0 04/20/2013 0430   CL 105 04/20/2013 0430   CO2 23 04/20/2013 0430   BUN 13 04/20/2013 0430   CREATININE 0.91 04/22/2013 0512   GLUCOSE 105* 04/20/2013 0430   CALCIUM 8.7 04/20/2013 0430   AST 13 04/16/2013 0426   ALT 21 04/16/2013 0426   ALKPHOS 54 04/16/2013 0426   BILITOT 0.5 04/16/2013 0426   PROT 5.9* 04/16/2013 0426   PROT 6.9 04/06/2013 1114   ALBUMIN 3.0* 04/16/2013 0426     Studies/Results: No results found.  Anti-infectives: Anti-infectives   Start     Dose/Rate Route Frequency Ordered Stop   04/21/13 1200  ceFAZolin (ANCEF) IVPB 2 g/50 mL premix    Comments:  Give in OR holding.   1 g 50 mL/hr over 30 Minutes Intravenous On call 04/21/13 0835 04/21/13 1259      Assessment Active Problems:   SBO (small bowel obstruction) s/p laparoscopic lysis of adhesions 11/22      LOS: 8 days   D/c NGT Clear liquids.      [facility] 04/23/2013

## 2013-04-24 ENCOUNTER — Encounter (HOSPITAL_COMMUNITY): Payer: Self-pay | Admitting: General Surgery

## 2013-04-24 LAB — CBC
MCV: 88.9 fL (ref 78.0–100.0)
Platelets: 238 10*3/uL (ref 150–400)
RBC: 3.97 MIL/uL — ABNORMAL LOW (ref 4.22–5.81)
RDW: 12.1 % (ref 11.5–15.5)
WBC: 7.8 10*3/uL (ref 4.0–10.5)

## 2013-04-24 LAB — BASIC METABOLIC PANEL
BUN: 9 mg/dL (ref 6–23)
CO2: 24 mEq/L (ref 19–32)
Calcium: 9.1 mg/dL (ref 8.4–10.5)
Creatinine, Ser: 0.89 mg/dL (ref 0.50–1.35)
GFR calc Af Amer: >90 mL/min (ref >90)
GFR calc non Af Amer: 87 mL/min — ABNORMAL LOW (ref >90)
Sodium: 138 mEq/L (ref 135–145)

## 2013-04-24 MED ORDER — PREGABALIN 75 MG PO CAPS
125.0000 mg | ORAL_CAPSULE | Freq: Two times a day (BID) | ORAL | Status: DC
  Administered 2013-04-24: 22:00:00 125 mg via ORAL
  Filled 2013-04-24: qty 3
  Filled 2013-04-24 (×2): qty 1

## 2013-04-24 MED ORDER — PREGABALIN 75 MG PO CAPS
75.0000 mg | ORAL_CAPSULE | Freq: Two times a day (BID) | ORAL | Status: DC
  Administered 2013-04-24: 75 mg via ORAL
  Filled 2013-04-24: qty 1

## 2013-04-24 MED ORDER — HYDROCODONE-ACETAMINOPHEN 5-325 MG PO TABS: 1.0000 | ORAL_TABLET | ORAL | Status: DC | PRN

## 2013-04-24 MED ORDER — ACETAMINOPHEN 325 MG PO TABS
650.0000 mg | ORAL_TABLET | ORAL | Status: DC | PRN
  Administered 2013-04-24 (×2): 650 mg via ORAL
  Filled 2013-04-24 (×2): qty 2

## 2013-04-24 NOTE — Progress Notes (Signed)
3 Days Post-Op  Subjective: States that he feels better. Passing lots of flatus and did have a bowel movement. Denies nausea.  Objective: Vital signs in last 24 hours: Temp:  [97.5 F (36.4 C)-97.8 F (36.6 C)] 97.6 F (36.4 C) (11/24 0500) Pulse Rate:  [72-78] 72 (11/24 0500) Resp:  [16-18] 16 (11/24 0500) BP: (137-143)/(78-82) 143/82 mmHg (11/24 0500) SpO2:  [93 %-95 %] 93 % (11/24 0500) Last BM Date: 04/19/13  Intake/Output from previous day: 11/23 0701 - 11/24 0700 In: 2120 [P.O.:120; I.V.:2000] Out: 2200 [Urine:2200] Intake/Output this shift: Total I/O In: 800 [I.V.:800] Out: 900 [Urine:900]     EXAM: General appearance: alert. Pleasant. Cooperative. Mental status normal. Wife in room. GI: abdomen soft. Active bowel sounds. Wounds looked fine. Minimal incisional tenderness. Borderline distended.  Lab Results:  Results for orders placed during the hospital encounter of 04/15/13 (from the past 24 hour(s))  BASIC METABOLIC PANEL     Status: Abnormal   Collection Time    04/24/13  5:09 AM      Result Value Range   Sodium 138  135 - 145 mEq/L   Potassium 3.9  3.5 - 5.1 mEq/L   Chloride 105  96 - 112 mEq/L   CO2 24  19 - 32 mEq/L   Glucose, Bld 106 (*) 70 - 99 mg/dL   BUN 9  6 - 23 mg/dL   Creatinine, Ser 1.61  0.50 - 1.35 mg/dL   Calcium 9.1  8.4 - 09.6 mg/dL   GFR calc non Af Amer 87 (*) >90 mL/min   GFR calc Af Amer >90  >90 mL/min  CBC     Status: Abnormal   Collection Time    04/24/13  5:09 AM      Result Value Range   WBC 7.8  4.0 - 10.5 K/uL   RBC 3.97 (*) 4.22 - 5.81 MIL/uL   Hemoglobin 12.1 (*) 13.0 - 17.0 g/dL   HCT 04.5 (*) 40.9 - 81.1 %   MCV 88.9  78.0 - 100.0 fL   MCH 30.5  26.0 - 34.0 pg   MCHC 34.3  30.0 - 36.0 g/dL   RDW 91.4  78.2 - 95.6 %   Platelets 238  150 - 400 K/uL     Studies/Results: @RISRSLT24 @  . antiseptic oral rinse  15 mL Mouth Rinse q12n4p  . chlorhexidine  15 mL Mouth Rinse BID  . enoxaparin (LOVENOX) injection  50  mg Subcutaneous QHS  . latanoprost  1 drop Both Eyes QHS  . loratadine  10 mg Oral Daily  . pantoprazole (PROTONIX) IV  40 mg Intravenous QHS  . timolol  1 drop Both Eyes Daily     Assessment/Plan: s/p Procedure(s): DIAGNOSTIC LAPAROSCOPY, lysis of adhesions for partial small bowel obstruction   POD #2. Laparoscopic lysis of adhesions for SBO. Single adhesion right lower quadrant with angulation.No prior abdominal surgery. Obstruction and ileus are resolving clinically Advance diet as tolerated Oral analgesic Home, possibly tomorrow.  Monoclonal gammopathy of uncertain significance Peripheral neuropathy Nontoxic goiter     @PROBHOSP @  LOS: 9 days    Earmon Sherrow M 04/24/2013  . .prob

## 2013-04-25 MED ORDER — HYDROCODONE-ACETAMINOPHEN 5-325 MG PO TABS: 1.0000 | ORAL_TABLET | ORAL | Status: DC | PRN

## 2013-04-25 NOTE — Discharge Summary (Signed)
Patient ID: Daniel MOUSEL Sr. [id number] 67 y.o. 01-08-1946  Admit date: 04/15/2013  Discharge date and time: 04/25/2013  Admitting Physician: CCS,MD  Discharge Physician: Ernestene Mention  Admission Diagnoses: Hypokalemia [276.8] SBO (small bowel obstruction) [560.9] Hyperglycemia [790.29] Abdominal pain [789.00]  Discharge Diagnoses: Small bowel obstruction secondary to adhesions. Peripheral neuropathy Obesity Glaucoma Monoclonal gammopathy of uncertain significance  Operations: Procedure(s): DIAGNOSTIC LAPAROSCOPY, lysis of adhesions for partial small bowel obstruction  Admission Condition: fair  Discharged Condition: good  Indication for Admission: This patient presented with a 2 to three-day history of vomiting and diarrhea. . The abdominal pain was crampy in nature. He had small amounts of loose stools. He felt somewhat distended.On initial examination his abdomen was soft and nontender. No scars or hernias were noted. Seemed  to be slightly distended. Initial diagnosis was gastroenteritis  Hospital course:  He was admitted and remained stable, but continued to be distended. X-rays demonstrated progressive small bowel distention, suggesting that the diagnosis was small bowel obstruction  rather than ileus from gastroenteritis.He was taken to the operating room on 04/21/2013. Dr. Abbey Chatters performed laparoscopic evaluation and laparoscopic lysis of adhesions. A single adhesion in the right abdomen was taken down and that appeared to relieve the obstruction. He rapidly improved over the next 48-72 hours.Marland Kitchen He resumed flatus and had several bowel movements. Advanced his diet to a regular diet and he felt well and was ready to go home. The day of discharge his abdomen was soft. Nontender. Trocar sites looked good. Bowel sounds were normal. He was given instructions in diet and activities. He is given a prescription for Vicodin for pain and told to continue all his usual  medications. He was asked to see Dr. Abbey Chatters the office in 2 weeks.  Consults: [facility] hospitalists for management of medical problems.  Significant Diagnostic Studies: radiology: Abdominal x-rays and CT scan in.  Treatments: surgery: Laparoscopic lysis of adhesions for relief of small bowel obstruction  Disposition: Home  Patient Instructions:    Medication List         cetirizine 10 MG tablet  Commonly known as:  ZYRTEC  Take 10 mg by mouth daily.     Fish Oil 1000 MG Caps  Take 1,000 mg by mouth daily. 3     HYDROcodone-acetaminophen 5-325 MG per tablet  Commonly known as:  NORCO/VICODIN  Take 1-2 tablets by mouth every 4 (four) hours as needed.     ibuprofen 600 MG tablet  Commonly known as:  ADVIL,MOTRIN  Take 600 mg by mouth every 6 (six) hours as needed.     latanoprost 0.005 % ophthalmic solution  Commonly known as:  XALATAN     meloxicam 15 MG tablet  Commonly known as:  MOBIC     multivitamin-iron-minerals-folic acid chewable tablet  Chew 1 tablet by mouth daily.     nabumetone 500 MG tablet  Commonly known as:  RELAFEN     pantoprazole 40 MG tablet  Commonly known as:  PROTONIX     pregabalin 75 MG capsule  Commonly known as:  LYRICA  Take 75 mg by mouth 2 (two) times daily.     promethazine 25 MG tablet  Commonly known as:  PHENERGAN  Take 25 mg by mouth every 6 (six) hours as needed for nausea or vomiting.     timolol 0.5 % ophthalmic gel-forming  Commonly known as:  TIMOPTIC-XR        Activity: activity as tolerated Diet: low fat, low cholesterol diet Wound Care:  none needed  Follow-up:  With Dr. Abbey Chatters in 2 weeks.  Signed: Angelia Mould. Derrell Lolling, M.D., FACS General and minimally invasive surgery Breast and Colorectal Surgery  04/25/2013, 5:58 AM

## 2013-04-28 ENCOUNTER — Telehealth (INDEPENDENT_AMBULATORY_CARE_PROVIDER_SITE_OTHER): Payer: Self-pay | Admitting: Surgery

## 2013-04-28 NOTE — Telephone Encounter (Signed)
Patient called with complaints of constipation.  No nausea or emesis.  No pain.  Recommend milk of magnesia 60cc PO with glass of water.  May repeat times one.  Call with any further problems.  Velora Heckler, MD, [facility] Office: [phone]

## 2013-05-01 NOTE — Telephone Encounter (Signed)
Noted  

## 2013-05-02 ENCOUNTER — Encounter (INDEPENDENT_AMBULATORY_CARE_PROVIDER_SITE_OTHER): Payer: Self-pay | Admitting: Radiology

## 2013-05-02 ENCOUNTER — Ambulatory Visit (INDEPENDENT_AMBULATORY_CARE_PROVIDER_SITE_OTHER): Payer: Medicare Other | Admitting: Diagnostic Neuroimaging

## 2013-05-02 DIAGNOSIS — G609 Hereditary and idiopathic neuropathy, unspecified: Secondary | ICD-10-CM

## 2013-05-02 DIAGNOSIS — Z0289 Encounter for other administrative examinations: Secondary | ICD-10-CM

## 2013-05-08 NOTE — Procedures (Signed)
   [facility]  NCS (NERVE CONDUCTION STUDY) WITH EMG (ELECTROMYOGRAPHY) REPORT   STUDY DATE: 05/02/13 PATIENT NAME: Daniel CODRINGTON Sr. DOB: April 24, 1946 MRN: [id number]  ORDERING CLINICIAN:   TECHNOLOGIST: Kaylyn Lim ELECTROMYOGRAPHER: Elspeth Cho, DO  CLINICAL INFORMATION: 66 year old male with suspected neuropathy. Exam shows normal strength, absent vibration sensation of the toes and decreased pinprick, light touch, temperature and feet. Reflexes are 1+ in the upper extremities, absent in the lower extremities with downgoing toes.  FINDINGS: NERVE CONDUCTION STUDY: Right median motor response has prolonged distal latency (4.8 ms), normal amplitude, normal conduction velocity and borderline F-wave latency. Left median motor responses prolonged distal latency (4.0 ms) decreased amplitude, normal conduction velocity and borderline F-wave latency. Right ulnar motor response has normal distal latency, amplitude, conduction velocity and F-wave latency. Right peroneal motor responses normal distal latency, decreased amplitude, normal conduction velocity and borderline F-wave latency. Left peroneal motor response has normal distal latency, decreased amplitude, slow conduction velocity below the knee (36 m/s) normal conduction velocity above the knee, and prolonged F-wave latency. Right tibial motor responses normal distal latency, partial conduction block with stimulation at the knee, normal conduction velocity, prolonged F-wave latency. Left tibial motor response has normal distal latency, partial conduction block with stimulation at the knee (with temporal dispersion), slightly decreased conduction velocity (37 m/s) and prolonged F-wave latency (68 ms).   Bilateral median sensory responses have decreased amplitudes and slow conduction velocities (right 44 m/s, left 42 m/s, normal greater than or equal to 48 m/s). Right ulnar sensory response is normal. Bilateral sural sensory  responses have decreased amplitudes and slow conduction velocity (37 m/s).  NEEDLE ELECTROMYOGRAPHY: Needle examination of selected muscles of the left lower extremity (vastus medialis, tibialis anterior, gastrocnemius) and bilateral L5-S1 paraspinal muscles is normal. No abnormal spontaneous activity at rest and normal motor unit recruitment exertion.  IMPRESSION:  Abnormal study demonstrating: 1. Length-dependent axonal sensorimotor polyneuropathy with demyelinating features (partial conduction block in bilateral tibial motor responses, with temporal dispersion in the left tibial motor response proximally). Calculated conduction velocities do not fall within the demyelinating range however.  2. Bilateral median neuropathies at the wrists, consistent with bilateral carpal tunnel syndrome.   INTERPRETING PHYSICIAN:  Suanne Marker, MD Certified in Neurology, Neurophysiology and Neuroimaging  [facility] [address], [address] [place], [place] [address] [phone]

## 2013-05-17 ENCOUNTER — Encounter (INDEPENDENT_AMBULATORY_CARE_PROVIDER_SITE_OTHER): Payer: Self-pay | Admitting: General Surgery

## 2013-05-17 ENCOUNTER — Ambulatory Visit (INDEPENDENT_AMBULATORY_CARE_PROVIDER_SITE_OTHER): Payer: Medicare Other | Admitting: General Surgery

## 2013-05-17 VITALS — BP 140/80 | HR 84 | Temp 99.0°F | Resp 15 | Ht 73.0 in | Wt 240.6 lb

## 2013-05-17 DIAGNOSIS — Z4889 Encounter for other specified surgical aftercare: Secondary | ICD-10-CM

## 2013-05-17 NOTE — Patient Instructions (Signed)
Avoid food getting a lot of gas or constipation. Do not overeat. May resume normal activities as long as there is no pain or discomfort.

## 2013-05-17 NOTE — Progress Notes (Signed)
Procedure:  Laparoscopic lysis of adhesions for partial small bowel obstruction  Date:  04/21/2013  History:  He is here for his first postoperative visit. He has an occasional cramping pain but for the most part his bowels are moving well. He is passing gas. He is tolerating his diet.  Exam: General- Is in NAD. Abdomen soft, small left-sided incision is clean and intact.  Assessment:  Progressing well postoperatively  Plan:  Low-fat diet. Activities as tolerated. Return visit as needed.

## 2013-05-24 ENCOUNTER — Telehealth: Payer: Self-pay | Admitting: Neurology

## 2013-05-24 NOTE — Telephone Encounter (Signed)
Patient called stating that Dr. Hosie Poisson gave him Lyrica samples and to call back and he has run out so that he can get a script for it. Patient uses [facility] pharmacy and states that they close at 12 today.

## 2013-05-26 ENCOUNTER — Telehealth: Payer: Self-pay | Admitting: *Deleted

## 2013-05-26 NOTE — Telephone Encounter (Signed)
I called the cell phone and home phone, left messages, will call back later.

## 2013-05-27 NOTE — Telephone Encounter (Signed)
I called the patient again at the cell and home numbers. I left messages. I asked that the patient call back and give a phone number that he can be reached at.

## 2013-05-29 ENCOUNTER — Other Ambulatory Visit: Payer: Self-pay | Admitting: Neurology

## 2013-05-29 ENCOUNTER — Telehealth: Payer: Self-pay | Admitting: Neurology

## 2013-05-29 MED ORDER — PREGABALIN 75 MG PO CAPS: 150.0000 mg | ORAL_CAPSULE | Freq: Two times a day (BID) | ORAL | Status: DC

## 2013-05-29 NOTE — Telephone Encounter (Signed)
Call returned. Will increase Lyrica to 150mg  BID. Will discuss possible nerve biopsy with Dr Terrace Arabia when she returns.

## 2013-05-29 NOTE — Telephone Encounter (Signed)
Patient returning call to Dr. Anne Hahn about the pains in his feet getting worse. Please call him back, patient states he will make sure he has his phone on him.

## 2013-05-30 ENCOUNTER — Telehealth: Payer: Self-pay | Admitting: Neurology

## 2013-05-30 NOTE — Telephone Encounter (Signed)
Patient stated he talked to Dr. Hosie Poisson yesterday regarding lab work done in November being sent to his PCP - His PCP's office has not received anything. Patient would like a call back regarding getting this issue resolved.

## 2013-05-30 NOTE — Telephone Encounter (Signed)
Notified patient that lab results were just faxed over to PCP. Dr. Pincus Badder.

## 2013-06-06 ENCOUNTER — Telehealth: Payer: Self-pay | Admitting: Neurology

## 2013-06-06 ENCOUNTER — Other Ambulatory Visit: Payer: Self-pay | Admitting: Neurology

## 2013-06-06 DIAGNOSIS — G609 Hereditary and idiopathic neuropathy, unspecified: Secondary | ICD-10-CM

## 2013-06-12 NOTE — Telephone Encounter (Signed)
erro

## 2013-06-13 ENCOUNTER — Encounter: Payer: Self-pay | Admitting: Neurology

## 2013-06-13 ENCOUNTER — Ambulatory Visit (INDEPENDENT_AMBULATORY_CARE_PROVIDER_SITE_OTHER): Payer: Medicare Other | Admitting: Neurology

## 2013-06-13 ENCOUNTER — Encounter (INDEPENDENT_AMBULATORY_CARE_PROVIDER_SITE_OTHER): Payer: Self-pay

## 2013-06-13 VITALS — BP 127/78 | HR 79 | Ht 72.0 in

## 2013-06-13 DIAGNOSIS — G629 Polyneuropathy, unspecified: Secondary | ICD-10-CM

## 2013-06-13 DIAGNOSIS — G589 Mononeuropathy, unspecified: Secondary | ICD-10-CM

## 2013-06-16 NOTE — Progress Notes (Signed)
68 years old gentleman was referred by Dr. Janann Colonel for skin biopsy to evaluate bilateral feet paresthesia, abnormal nerve conduction study, consistent with sensorimotor polyneuropathy, there was also described demyelinating features  Patient was in right lateral recombinant position. Sterile technique. 1% lidocaine with epinephrine was used for local anesthesia. Punctuated skin biopsy was performed. 3 mm skin sample were obtained at at left foot, above left extensor digitorum brevis, and left lateral calf, 10 cm above lateral malleolus. Patient tolerated the procedure well.  The wound was covered with neosporin antibiotic cream and bandage.

## 2013-06-26 ENCOUNTER — Telehealth: Payer: Self-pay | Admitting: Hematology and Oncology

## 2013-06-26 NOTE — Telephone Encounter (Signed)
lvom for pt to return call in re to referral.  °

## 2013-06-27 ENCOUNTER — Telehealth: Payer: Self-pay | Admitting: Hematology and Oncology

## 2013-06-27 NOTE — Telephone Encounter (Signed)
Return patient called to schedule appt patient not available had to leave a message.

## 2013-06-27 NOTE — Telephone Encounter (Signed)
2ND. LVOM FOR PT TO RETURN CALL IN RE TO REFERRAL.  °

## 2013-06-28 ENCOUNTER — Telehealth: Payer: Self-pay | Admitting: Hematology and Oncology

## 2013-06-28 NOTE — Telephone Encounter (Signed)
S/W PT AND SCHEDULED NP APPT 02/09 @ 10:45 W/DR. Ehrenberg PACKET MAILED.

## 2013-06-29 ENCOUNTER — Telehealth: Payer: Self-pay | Admitting: Hematology and Oncology

## 2013-06-29 NOTE — Telephone Encounter (Signed)
C/D 06/29/13 for appt. 07/10/13

## 2013-07-04 ENCOUNTER — Telehealth: Payer: Self-pay | Admitting: *Deleted

## 2013-07-05 NOTE — Telephone Encounter (Signed)
I have called him,  Skin biopsy showed reduced epidermal nerve fibers at two biopsy sites, consistent with small fiber neuropathy.

## 2013-07-05 NOTE — Telephone Encounter (Signed)
Pt called again asking for test results.  Please call him back at [phone].  Thank you

## 2013-07-05 NOTE — Telephone Encounter (Signed)
The report is in your in-box.  Thanks.

## 2013-07-07 ENCOUNTER — Telehealth: Payer: Self-pay | Admitting: *Deleted

## 2013-07-07 NOTE — Telephone Encounter (Signed)
Patient is scheduled for 07-12-13 @ 11:00am.

## 2013-07-07 NOTE — Telephone Encounter (Signed)
Message copied by Vivi Barrack on Fri Jul 07, 2013 10:07 AM ------      Message from: Drema Dallas      Created: Thu Jul 06, 2013  4:22 PM       Please call Mr Dunstan to schedule a follow up visit for sometime next week. Thanks.      ----- Message -----         From: Marcial Pacas, MD         Sent: 07/05/2013   5:30 PM           To: Hulen Luster, DO            Peter:            I have called him skin biopsy showed evidence of small fiber neuropathy, he wants to be seen sooner than March.       ------

## 2013-07-10 ENCOUNTER — Ambulatory Visit: Payer: Medicare Other

## 2013-07-10 ENCOUNTER — Ambulatory Visit: Payer: Medicare Other | Admitting: Hematology and Oncology

## 2013-07-11 ENCOUNTER — Telehealth: Payer: Self-pay | Admitting: Hematology and Oncology

## 2013-07-11 ENCOUNTER — Encounter: Payer: Self-pay | Admitting: Hematology and Oncology

## 2013-07-11 ENCOUNTER — Ambulatory Visit: Payer: Medicare Other

## 2013-07-11 ENCOUNTER — Ambulatory Visit (HOSPITAL_BASED_OUTPATIENT_CLINIC_OR_DEPARTMENT_OTHER): Payer: Medicare Other | Admitting: Hematology and Oncology

## 2013-07-11 VITALS — BP 138/74 | HR 74 | Temp 96.9°F | Resp 18 | Ht 72.0 in | Wt 251.2 lb

## 2013-07-11 DIAGNOSIS — G609 Hereditary and idiopathic neuropathy, unspecified: Secondary | ICD-10-CM

## 2013-07-11 DIAGNOSIS — F1020 Alcohol dependence, uncomplicated: Secondary | ICD-10-CM

## 2013-07-11 DIAGNOSIS — D539 Nutritional anemia, unspecified: Secondary | ICD-10-CM

## 2013-07-11 DIAGNOSIS — D472 Monoclonal gammopathy: Secondary | ICD-10-CM

## 2013-07-11 DIAGNOSIS — C9000 Multiple myeloma not having achieved remission: Secondary | ICD-10-CM | POA: Insufficient documentation

## 2013-07-11 DIAGNOSIS — D649 Anemia, unspecified: Secondary | ICD-10-CM

## 2013-07-11 HISTORY — DX: Monoclonal gammopathy: D47.2

## 2013-07-11 HISTORY — DX: Nutritional anemia, unspecified: D53.9

## 2013-07-11 NOTE — Progress Notes (Signed)
[facility] NOTE  Patient Care Team: [name], MD as PCP - General (Family Medicine)  CHIEF COMPLAINTS/PURPOSE OF CONSULTATION:  IgA monoclonal gammopathy with polyneuropathy  HISTORY OF PRESENTING ILLNESS:  Daniel Whitaker 68 y.o. male is here because of abnormal blood work with IgA monoclonal gammopathy. According to the patient, he has extensive evaluation due to Polyneuropathy. His neurologist was ordering these tests to make sure that this is not related to paraproteinemia related disease. In November 2014, serum protein electrophoresis detected 0.3 M spike. The patient has severe neuropathy affecting the bottom of his feet bilaterally and the toes. He has several nerve conduction studies and was told he has nerve damage. He denies damage to his back from trauma or fall. He also has persistent right thigh numbness and burning sensation. He was placed on Lyrica for long time. He denies history of abnormal bone pain or bone fracture. Patient denies history of recurrent infection or atypical infections such as shingles of meningitis. Denies chills, night sweats, anorexia or abnormal weight loss.  MEDICAL HISTORY:  Past Medical History  Diagnosis Date  . Nontoxic uninodular goiter   . Peripheral neuropathy   . Esophageal reflux   . Erectile dysfunction   . Obesity   . Glaucoma   . Impaired fasting glucose   . Hypertriglyceridemia   . MGUS (monoclonal gammopathy of unknown significance)   . Neuropathy associated with MGUS 07/11/2013  . Unspecified deficiency anemia 07/11/2013    SURGICAL HISTORY: Past Surgical History  Procedure Laterality Date  . Knee surgery    . Colon resection N/A 04/21/2013    Procedure: DIAGNOSTIC LAPAROSCOPY, lysis of adhesions for partial small bowel obstruction;  Surgeon: Odis Hollingshead, MD;  Location: [facility];  Service: General;  Laterality: N/A;  . Small intestine surgery      SOCIAL HISTORY: History   Social  History  . Marital Status: Married    Spouse Name: Daniel Whitaker    Number of Children: 2  . Years of Education: GED   Occupational History  . Not on file.   Social History Main Topics  . Smoking status: Former Smoker    Quit date: 06/01/1984  . Smokeless tobacco: Never Used  . Alcohol Use: 0.0 oz/week     Comment: 12 BEERS WEEKLY  . Drug Use: No  . Sexual Activity: Not on file   Other Topics Concern  . Not on file   Social History Narrative   Patient lives at home with his wife Daniel Whitaker)   Patient works full-time.   Patient has two children.   Patient is right-handed.   Patient has his GED.   Patient does not drink caffeine.    FAMILY HISTORY: Family History  Problem Relation Age of Onset  . Brain cancer Mother   . Lung cancer Father     ALLERGIES:  is allergic to penicillins.  MEDICATIONS:  Current Outpatient Prescriptions  Medication Sig Dispense Refill  . cetirizine (ZYRTEC) 10 MG tablet Take 10 mg by mouth daily.      Marland Kitchen ibuprofen (ADVIL,MOTRIN) 600 MG tablet Take 600 mg by mouth every 6 (six) hours as needed.      . latanoprost (XALATAN) 0.005 % ophthalmic solution       . meloxicam (MOBIC) 15 MG tablet       . multivitamin-iron-minerals-folic acid (CENTRUM) chewable tablet Chew 1 tablet by mouth daily.      . nabumetone (RELAFEN) 500 MG tablet       .  Omega-3 Fatty Acids (FISH OIL) 1000 MG CAPS Take 1,000 mg by mouth daily. 3      . pantoprazole (PROTONIX) 40 MG tablet       . pregabalin (LYRICA) 75 MG capsule Take 2 capsules (150 mg total) by mouth 2 (two) times daily.  120 capsule  6  . timolol (TIMOPTIC-XR) 0.5 % ophthalmic gel-forming        No current facility-administered medications for this visit.    REVIEW OF SYSTEMS:   Eyes: Denies blurriness of vision, double vision or watery eyes Ears, nose, mouth, throat, and face: Denies mucositis or sore throat Respiratory: Denies cough, dyspnea or wheezes Cardiovascular: Denies palpitation, chest discomfort or  lower extremity swelling Gastrointestinal:  Denies nausea, heartburn or change in bowel habits Skin: Denies abnormal skin rashes Lymphatics: Denies new lymphadenopathy or easy bruising Neurological:Denies numbness, tingling or new weaknesses Behavioral/Psych: Mood is stable, no new changes  All other systems were reviewed with the patient and are negative.  PHYSICAL EXAMINATION: ECOG PERFORMANCE STATUS: 1 - Symptomatic but completely ambulatory  Filed Vitals:   07/11/13 1425  BP: 138/74  Pulse: 74  Temp: 96.9 F (36.1 C)  Resp: 18   Filed Weights   07/11/13 1425  Weight: 251 lb 3.2 oz (113.944 kg)    GENERAL:alert, no distress and comfortable SKIN: skin color, texture, turgor are normal, no rashes or significant lesions EYES: normal, conjunctiva are pink and non-injected, sclera clear OROPHARYNX:no exudate, no erythema and lips, buccal mucosa, and tongue normal  NECK: supple, thyroid normal size, non-tender, without nodularity LYMPH:  no palpable lymphadenopathy in the cervical, axillary or inguinal LUNGS: clear to auscultation and percussion with normal breathing effort HEART: regular rate & rhythm and no murmurs and no lower extremity edema ABDOMEN:abdomen soft, non-tender and normal bowel sounds Musculoskeletal:no cyanosis of digits and no clubbing  PSYCH: alert & oriented x 3 with fluent speech NEURO: no focal motor/sensory deficits  LABORATORY DATA:  I have reviewed the data as listed Lab Results  Component Value Date   WBC 7.8 04/24/2013   HGB 12.1* 04/24/2013   HCT 35.3* 04/24/2013   MCV 88.9 04/24/2013   PLT 238 04/24/2013   ASSESSMENT:  We discussed the approach to MGUS. I do not think the MGUS is related to his neuropathy but we need additional workup for this   PLAN:  #1 MGUS To rule out multiple myeloma/monoclonal paraproteinemia, I recommend complete blood work, 24 hour urine collection for UPEP and skeletal survey to rule out multiple myeloma #2  anemia I suspect this is anemia chronic disease. I will order an additional workup for this #3 severe peripheral neuropathy #4 alcoholism I suspect his peripheral neuropathy is due to severe alcoholism. I recommend the patient to cut down on alcohol intake.  Orders Placed This Encounter  Procedures  . DG Bone Survey Met    Standing Status: Future     Number of Occurrences:      Standing Expiration Date: 09/10/2014    Order Specific Question:  Reason for Exam (SYMPTOM  OR DIAGNOSIS REQUIRED)    Answer:  staging myeloma    Order Specific Question:  Preferred imaging location?    Answer:  [facility]  . Comprehensive metabolic panel    Standing Status: Future     Number of Occurrences:      Standing Expiration Date: 07/11/2014  . CBC & Diff and Retic    Standing Status: Future     Number of Occurrences:  Standing Expiration Date: 07/11/2014  . Lactate dehydrogenase    Standing Status: Future     Number of Occurrences:      Standing Expiration Date: 07/11/2014  . Ferritin    Standing Status: Future     Number of Occurrences:      Standing Expiration Date: 07/11/2014  . Vitamin B12    Standing Status: Future     Number of Occurrences:      Standing Expiration Date: 07/11/2014  . Sedimentation rate    Standing Status: Future     Number of Occurrences:      Standing Expiration Date: 07/11/2014  . SPEP & IFE with QIG    Standing Status: Future     Number of Occurrences: 1     Standing Expiration Date: 07/11/2014  . Kappa/lambda light chains    Standing Status: Future     Number of Occurrences:      Standing Expiration Date: 07/11/2014  . Beta 2 microglobuline, serum    Standing Status: Future     Number of Occurrences:      Standing Expiration Date: 07/11/2014  . Protein Electro, 24-Hour Urine    Standing Status: Future     Number of Occurrences: 1     Standing Expiration Date: 07/11/2014  . IFE, Urine (with Tot Prot)    Standing Status: Future     Number of  Occurrences: 1     Standing Expiration Date: 07/11/2014    All questions were answered. The patient knows to call the clinic with any problems, questions or concerns. I spent 40 minutes counseling the patient face to face. The total time spent in the appointment was 60 minutes and more than 50% was on counseling.     [place], [place], MD 07/11/2013 4:00 PM

## 2013-07-11 NOTE — Telephone Encounter (Signed)
gv and printed appt sched and avs for pt for Feb  °

## 2013-07-12 ENCOUNTER — Ambulatory Visit: Payer: Self-pay | Admitting: Neurology

## 2013-07-13 ENCOUNTER — Other Ambulatory Visit (HOSPITAL_BASED_OUTPATIENT_CLINIC_OR_DEPARTMENT_OTHER): Payer: Medicare Other

## 2013-07-13 ENCOUNTER — Ambulatory Visit (HOSPITAL_COMMUNITY)
Admission: RE | Admit: 2013-07-13 | Discharge: 2013-07-13 | Disposition: A | Discharge status: Home / Self Care | Payer: Medicare Other | Source: Ambulatory Visit | Attending: Hematology and Oncology | Admitting: Hematology and Oncology

## 2013-07-13 DIAGNOSIS — M47812 Spondylosis without myelopathy or radiculopathy, cervical region: Secondary | ICD-10-CM | POA: Insufficient documentation

## 2013-07-13 DIAGNOSIS — M51379 Other intervertebral disc degeneration, lumbosacral region without mention of lumbar back pain or lower extremity pain: Secondary | ICD-10-CM | POA: Insufficient documentation

## 2013-07-13 DIAGNOSIS — I700 Atherosclerosis of aorta: Secondary | ICD-10-CM | POA: Insufficient documentation

## 2013-07-13 DIAGNOSIS — D472 Monoclonal gammopathy: Secondary | ICD-10-CM

## 2013-07-13 DIAGNOSIS — M19019 Primary osteoarthritis, unspecified shoulder: Secondary | ICD-10-CM | POA: Insufficient documentation

## 2013-07-13 DIAGNOSIS — D539 Nutritional anemia, unspecified: Secondary | ICD-10-CM

## 2013-07-13 DIAGNOSIS — M4120 Other idiopathic scoliosis, site unspecified: Secondary | ICD-10-CM | POA: Insufficient documentation

## 2013-07-13 DIAGNOSIS — M5137 Other intervertebral disc degeneration, lumbosacral region: Secondary | ICD-10-CM | POA: Insufficient documentation

## 2013-07-13 DIAGNOSIS — M47817 Spondylosis without myelopathy or radiculopathy, lumbosacral region: Secondary | ICD-10-CM | POA: Insufficient documentation

## 2013-07-13 LAB — CBC & DIFF AND RETIC
BASO%: 0.8 % (ref 0.0–2.0)
Basophils Absolute: 0.1 10*3/uL (ref 0.0–0.1)
EOS%: 4.3 % (ref 0.0–7.0)
Eosinophils Absolute: 0.3 10*3/uL (ref 0.0–0.5)
HCT: 38.6 % (ref 38.4–49.9)
HGB: 13.5 g/dL (ref 13.0–17.1)
IMMATURE RETIC FRACT: 15.4 % — AB (ref 3.00–10.60)
LYMPH#: 1.8 10*3/uL (ref 0.9–3.3)
LYMPH%: 30.2 % (ref 14.0–49.0)
MCH: 30.8 pg (ref 27.2–33.4)
MCHC: 35 g/dL (ref 32.0–36.0)
MCV: 88.1 fL (ref 79.3–98.0)
MONO#: 0.5 10*3/uL (ref 0.1–0.9)
MONO%: 8.5 % (ref 0.0–14.0)
NEUT#: 3.4 10*3/uL (ref 1.5–6.5)
NEUT%: 56.2 % (ref 39.0–75.0)
Platelets: 152 10*3/uL (ref 140–400)
RBC: 4.38 10*6/uL (ref 4.20–5.82)
RDW: 13.4 % (ref 11.0–14.6)
Retic %: 1.91 % — ABNORMAL HIGH (ref 0.80–1.80)
Retic Ct Abs: 83.66 10*3/uL (ref 34.80–93.90)
WBC: 6.1 10*3/uL (ref 4.0–10.3)

## 2013-07-13 LAB — COMPREHENSIVE METABOLIC PANEL (CC13)
ALBUMIN: 4 g/dL (ref 3.5–5.0)
ALT: 20 U/L (ref 0–55)
ANION GAP: 10 meq/L (ref 3–11)
AST: 18 U/L (ref 5–34)
Alkaline Phosphatase: 84 U/L (ref 40–150)
BUN: 18.6 mg/dL (ref 7.0–26.0)
CO2: 24 meq/L (ref 22–29)
Calcium: 9.7 mg/dL (ref 8.4–10.4)
Chloride: 109 mEq/L (ref 98–109)
Creatinine: 0.9 mg/dL (ref 0.7–1.3)
GLUCOSE: 82 mg/dL (ref 70–140)
POTASSIUM: 4.1 meq/L (ref 3.5–5.1)
SODIUM: 143 meq/L (ref 136–145)
TOTAL PROTEIN: 6.7 g/dL (ref 6.4–8.3)
Total Bilirubin: 0.45 mg/dL (ref 0.20–1.20)

## 2013-07-13 LAB — LACTATE DEHYDROGENASE (CC13): LDH: 207 U/L (ref 125–245)

## 2013-07-13 LAB — FERRITIN CHCC: Ferritin: 85 ng/ml (ref 22–316)

## 2013-07-17 ENCOUNTER — Ambulatory Visit (INDEPENDENT_AMBULATORY_CARE_PROVIDER_SITE_OTHER): Payer: Medicare Other | Admitting: Neurology

## 2013-07-17 ENCOUNTER — Encounter (INDEPENDENT_AMBULATORY_CARE_PROVIDER_SITE_OTHER): Payer: Self-pay

## 2013-07-17 ENCOUNTER — Encounter: Payer: Self-pay | Admitting: Neurology

## 2013-07-17 VITALS — Ht 74.0 in | Wt 253.0 lb

## 2013-07-17 DIAGNOSIS — G609 Hereditary and idiopathic neuropathy, unspecified: Secondary | ICD-10-CM

## 2013-07-17 LAB — UIFE/LIGHT CHAINS/TP QN, 24-HR UR
ALBUMIN, U: DETECTED
ALPHA 1 UR: DETECTED — AB
ALPHA 2 UR: DETECTED — AB
Beta, Urine: DETECTED — AB
FREE LAMBDA LT CHAINS, UR: 0.06 mg/dL (ref 0.02–0.67)
Free Kappa Lt Chains,Ur: 0.63 mg/dL (ref 0.14–2.42)
Free Kappa/Lambda Ratio: 10.5 ratio — ABNORMAL HIGH (ref 2.04–10.37)
Free Lambda Excretion/Day: 1.92 mg/d
Free Lt Chn Excr Rate: 20.16 mg/d
GAMMA UR: DETECTED — AB
TOTAL PROTEIN, URINE-UR/DAY: 38 mg/d (ref 10–140)
Time: 24 hours
Total Protein, Urine: 1.2 mg/dL
Volume, Urine: 3200 mL

## 2013-07-17 LAB — UPEP/TP, 24-HR URINE
Collection Interval: 24 hours
Total Protein, Urine: <3 mg/dL
Total Volume, Urine: 3200 mL

## 2013-07-17 LAB — SPEP & IFE WITH QIG
ALPHA-2-GLOBULIN: 7.8 % (ref 7.1–11.8)
Albumin ELP: 63.4 % (ref 55.8–66.1)
Alpha-1-Globulin: 3.2 % (ref 2.9–4.9)
BETA GLOBULIN: 6.5 % (ref 4.7–7.2)
Beta 2: 6.6 % — ABNORMAL HIGH (ref 3.2–6.5)
GAMMA GLOBULIN: 12.5 % (ref 11.1–18.8)
IGG (IMMUNOGLOBIN G), SERUM: 749 mg/dL (ref 650–1600)
IgA: 392 mg/dL — ABNORMAL HIGH (ref 68–379)
IgM, Serum: 67 mg/dL (ref 41–251)
M-SPIKE, %: 0.23 g/dL
Total Protein, Serum Electrophoresis: 6.6 g/dL (ref 6.0–8.3)

## 2013-07-17 LAB — KAPPA/LAMBDA LIGHT CHAINS
KAPPA FREE LGHT CHN: 1.54 mg/dL (ref 0.33–1.94)
KAPPA LAMBDA RATIO: 1.22 (ref 0.26–1.65)
Lambda Free Lght Chn: 1.26 mg/dL (ref 0.57–2.63)

## 2013-07-17 LAB — SEDIMENTATION RATE: SED RATE: 4 mm/h (ref 0–16)

## 2013-07-17 LAB — VITAMIN B12: Vitamin B-12: 390 pg/mL (ref 211–911)

## 2013-07-17 LAB — BETA 2 MICROGLOBULIN, SERUM: BETA 2 MICROGLOBULIN: 1.36 mg/L (ref 1.01–1.73)

## 2013-07-17 MED ORDER — LIDOCAINE 5 % EX OINT: 1.0000 "application " | TOPICAL_OINTMENT | Freq: Two times a day (BID) | CUTANEOUS | Status: DC | PRN

## 2013-07-17 NOTE — Patient Instructions (Signed)
Overall you are doing fairly well but I do want to suggest a few things today:   Remember to drink plenty of fluid, eat healthy meals and do not skip any meals. Try to eat protein with a every meal and eat a healthy snack such as fruit or nuts in between meals. Try to keep a regular sleep-wake schedule and try to exercise daily, particularly in the form of walking, 20-30 minutes a day, if you can.   As far as your medications are concerned, I would like to suggest the following: 1)Continue Lyrica at its current dose and schedule 2)We will add Lidocaine ointment for symptomatic relief. Please apply twice a day as needed.   As far as diagnostic testing:  1)Please have some blood work drawn after checking out today  Please call our office in 4 weeks to update on results. If no improvement will consider making switch to Cymbalta at that point.   I would like to see you back in 4 months, sooner if we need to. Please call us with any interim questions, concerns, problems, updates or refill requests.   Please also call us for any test results so we can go over those with you on the phone.  My clinical assistant and will answer any of your questions and relay your messages to me and also relay most of my messages to you.   Our phone number is [phone]. We also have an after hours call service for urgent matters and there is a physician on-call for urgent questions. For any emergencies you know to call 911 or go to the nearest emergency room

## 2013-07-17 NOTE — Progress Notes (Signed)
[facility]    Provider:  Dr Janann Colonel Referring Provider: [name], MD Primary Care Physician:  [name], MD  CC:  neuropathy  HPI:  Daniel Whitaker is a 68 y.o. male here as a follow from Dr. Brigitte Pulse for suspected peripheral neuropathy. Last visit was 04/2013 since then he has had a lab workup revealing a MGUS and a nerve biospy consistent with a small fiber neuropathy. He continues to have discomfort, this occurs predominantly when he is resting, at night and when his shoes are off. He feels there has been some improvement with the increased and Lyrica. He does note some concern over mild weight gain with the higher-dose Lyrica. He is scheduled to followup with oncology, for further workup of the MGUS. He feels he does not drink that much, notes he drinks a few beers a week.   Initial visit 04/2013 Started 2 months ago, starting slowly and getting a more severe. Described as a burning type pain, on soles of feet, is constant pain, hurts more with his feet off when bare footed, his feet will occasionaly spasm. Not worse at night or when anything brushing againt his feet. No loss of strength in his feet. No ascending sensory changes. Has history fo RLS. The burning does not get better with movement. Notes episodes of lumbar radicular type pain in the past, this is occurring years ago. Reports having had EMG nerve conduction study in the past, thinks was around 4-5 years ago unsure what it showed but states it was abnormal.  Overall healthy, no DM. Prescribed Lyrica 75 mg twice a day by his primary care physician, initially noted benefit but has not noted any sustained benefit. Tolerating well no excessive fatigue.  Reviewed notes, labs and imaging from outside physicians, which showed normal TSH, normal hemoglobin A1c borderline low B12 of 304.  Review of Systems: Out of a complete 14 system review, the patient complains of only the following symptoms, and all other  reviewed systems are negative. Positive for numbness  History   Social History  . Marital Status: Married    Spouse Name: [name]    Number of Children: 2  . Years of Education: GED   Occupational History  . Not on file.   Social History Main Topics  . Smoking status: Former Smoker    Quit date: 06/01/1984  . Smokeless tobacco: Never Used  . Alcohol Use: 0.0 oz/week     Comment: 12 BEERS WEEKLY  . Drug Use: No  . Sexual Activity: Not on file   Other Topics Concern  . Not on file   Social History Narrative   Patient lives at home with his wife [name])   Patient works full-time.   Patient has two children.   Patient is right-handed.   Patient has his GED.   Patient does not drink caffeine.    Family History  Problem Relation Age of Onset  . Brain cancer Mother   . Lung cancer Father     Past Medical History  Diagnosis Date  . Nontoxic uninodular goiter   . Peripheral neuropathy   . Esophageal reflux   . Erectile dysfunction   . Obesity   . Glaucoma   . Impaired fasting glucose   . Hypertriglyceridemia   . MGUS (monoclonal gammopathy of unknown significance)   . Neuropathy associated with MGUS 07/11/2013  . Unspecified deficiency anemia 07/11/2013    Past Surgical History  Procedure Laterality Date  . Knee surgery    .  Colon resection N/A 04/21/2013    Procedure: DIAGNOSTIC LAPAROSCOPY, lysis of adhesions for partial small bowel obstruction;  Surgeon: Odis Hollingshead, MD;  Location: [facility];  Service: General;  Laterality: N/A;  . Small intestine surgery      Current Outpatient Prescriptions  Medication Sig Dispense Refill  . cetirizine (ZYRTEC) 10 MG tablet Take 10 mg by mouth daily.      . Cholecalciferol (VITAMIN D) 2000 UNITS CAPS Take 2,000 Units by mouth.      Marland Kitchen ibuprofen (ADVIL,MOTRIN) 600 MG tablet Take 600 mg by mouth every 6 (six) hours as needed.      . latanoprost (XALATAN) 0.005 % ophthalmic solution       . meloxicam (MOBIC) 15 MG tablet        . multivitamin-iron-minerals-folic acid (CENTRUM) chewable tablet Chew 1 tablet by mouth daily.      . nabumetone (RELAFEN) 500 MG tablet       . Omega-3 Fatty Acids (FISH OIL) 1000 MG CAPS Take 1,000 mg by mouth daily. 3      . pantoprazole (PROTONIX) 40 MG tablet       . pregabalin (LYRICA) 75 MG capsule Take 2 capsules (150 mg total) by mouth 2 (two) times daily.  120 capsule  6  . timolol (TIMOPTIC-XR) 0.5 % ophthalmic gel-forming        No current facility-administered medications for this visit.    Allergies as of 07/17/2013 - Review Complete 07/17/2013  Allergen Reaction Noted  . Penicillins  04/06/2013    Vitals: Ht 6\' 2"  (1.88 m)  Wt 253 lb (114.76 kg)  BMI 32.47 kg/m2 Last Weight:  Wt Readings from Last 1 Encounters:  07/17/13 253 lb (114.76 kg)   Last Height:   Ht Readings from Last 1 Encounters:  07/17/13 6\' 2"  (1.88 m)     Physical exam: Exam: Gen: NAD, conversant Eyes: anicteric sclerae, moist conjunctivae HENT: Atraumatic, oropharynx clear Neck: Trachea midline; supple,  Lungs: CTA, no wheezing, rales, rhonic                          CV: RRR, no MRG Abdomen: Soft, non-tender;  Extremities: No peripheral edema  Skin: Normal temperature, no rash,  Psych: Appropriate affect, pleasant  Neuro: MS: AA&Ox3, appropriately interactive, normal affect   Speech: fluent w/o paraphasic error  Memory: good recent and remote recall  CN: PERRL, EOMI no nystagmus, no ptosis, sensation intact to LT V1-V3 bilat, face symmetric, no weakness, hearing grossly intact, palate elevates symmetrically, shoulder shrug 5/5 bilat,  tongue protrudes midline, no fasiculations noted.  Motor: normal bulk and tone Strength: 5/5  In all extremities, limited flexion ROM L great toe  Coord: rapid alternating and point-to-point (FNF, HTS) movements intact.  Reflexes: symmetrical, bilat downgoing toes  Sens: LT intact in all extremities, decreased temp, vibration, PP,  proprioception bilat LE L>R  Gait: posture, stance, stride and arm-swing normal.   Assessment:  After physical and neurologic examination, review of laboratory studies, imaging, neurophysiology testing and pre-existing records, assessment will be reviewed on the problem list.  Plan:  Treatment plan and additional workup will be reviewed under Problem List.  1)peripheral neuropathy  67y/o gentleman presenting for followup evaluation of his peripheral neuropathy. At last visit he had a nerve biopsy done, which confirmed diagnosis of small fiber neuropathy. Currently taking Lyrica 150 mg twice a day with some relief. Scheduled to followup with oncology for further workup and goes. Discussed  different therapeutic options with patient, discussed the overall poor response to medications of small fiber neuropathies. At this time will continue patient on Lyrica and start lidocaine topical ointment for symptomatic relief. Patient instructed to call office back in 4 weeks, if no improvement would consider tapering off Lyrica and try Cymbalta at that time. Will check vitamin B1 folate today. Patient to followup in 4 months.

## 2013-07-19 LAB — FOLATE: Folate: 17.8 ng/mL (ref >3.0)

## 2013-07-19 LAB — VITAMIN B1, WHOLE BLOOD: Thiamine: 173 nmol/L (ref 66.5–200.0)

## 2013-07-19 NOTE — Progress Notes (Signed)
Quick Note:  Called patient and informed him of his normal labs, patient expressed understanding no questions or concerns ______

## 2013-07-25 ENCOUNTER — Telehealth: Payer: Self-pay | Admitting: *Deleted

## 2013-07-25 ENCOUNTER — Encounter (INDEPENDENT_AMBULATORY_CARE_PROVIDER_SITE_OTHER): Payer: Self-pay

## 2013-07-25 ENCOUNTER — Ambulatory Visit (HOSPITAL_BASED_OUTPATIENT_CLINIC_OR_DEPARTMENT_OTHER): Payer: Medicare Other | Admitting: Hematology and Oncology

## 2013-07-25 VITALS — BP 143/72 | HR 79 | Temp 97.3°F | Resp 20 | Ht 74.0 in | Wt 254.7 lb

## 2013-07-25 DIAGNOSIS — D649 Anemia, unspecified: Secondary | ICD-10-CM

## 2013-07-25 DIAGNOSIS — D472 Monoclonal gammopathy: Secondary | ICD-10-CM

## 2013-07-25 NOTE — Progress Notes (Signed)
[facility] OFFICE PROGRESS NOTE  Patient Care Team: [name], MD as PCP - General (Family Medicine)  DIAGNOSIS: IgA kappa MGUS  SUMMARY OF ONCOLOGIC HISTORY: This is a 89 your patients with severe peripheral neuropathy was referred here for evaluation of abnormal serum protein electrophoresis. Further studies revealed monoclonal gammopathy of unknown significance that is not the cause of his peripheral neuropathy. The peripheral neuropathy was thought to be related to alcoholism. He is being observed.  INTERVAL HISTORY: Daniel Whitaker 68 y.o. male returns for further followup. He has no new symptoms.  I have reviewed the past medical history, past surgical history, social history and family history with the patient and they are unchanged from previous note.  ALLERGIES:  is allergic to penicillins.  MEDICATIONS:  Current Outpatient Prescriptions  Medication Sig Dispense Refill  . cetirizine (ZYRTEC) 10 MG tablet Take 10 mg by mouth daily.      . Cholecalciferol (VITAMIN D) 2000 UNITS CAPS Take 2,000 Units by mouth.      . latanoprost (XALATAN) 0.005 % ophthalmic solution       . lidocaine (XYLOCAINE) 5 % ointment Apply 1 application topically 2 (two) times daily as needed.  35.44 g  3  . meloxicam (MOBIC) 15 MG tablet       . multivitamin-iron-minerals-folic acid (CENTRUM) chewable tablet Chew 1 tablet by mouth daily.      . nabumetone (RELAFEN) 500 MG tablet       . Omega-3 Fatty Acids (FISH OIL) 1000 MG CAPS Take 1,000 mg by mouth daily. 3      . pantoprazole (PROTONIX) 40 MG tablet       . pregabalin (LYRICA) 75 MG capsule Take 2 capsules (150 mg total) by mouth 2 (two) times daily.  120 capsule  6  . timolol (TIMOPTIC-XR) 0.5 % ophthalmic gel-forming        No current facility-administered medications for this visit.    REVIEW OF SYSTEMS:   All other systems were reviewed with the patient and are negative.  PHYSICAL EXAMINATION: ECOG PERFORMANCE  STATUS: 1 - Symptomatic but completely ambulatory  Filed Vitals:   07/25/13 1212  BP: 143/72  Pulse: 79  Temp: 97.3 F (36.3 C)  Resp: 20   Filed Weights   07/25/13 1212  Weight: 254 lb 11.2 oz (115.531 kg)    GENERAL:alert, no distress and comfortable Musculoskeletal:no cyanosis of digits and no clubbing  NEURO: alert & oriented x 3 with fluent speech, no focal motor/sensory deficits  LABORATORY DATA:  I have reviewed the data as listed    Component Value Date/Time   NA 143 07/13/2013 1329   NA 138 04/24/2013 0509   K 4.1 07/13/2013 1329   K 3.9 04/24/2013 0509   CL 105 04/24/2013 0509   CO2 24 07/13/2013 1329   CO2 24 04/24/2013 0509   GLUCOSE 82 07/13/2013 1329   GLUCOSE 106* 04/24/2013 0509   BUN 18.6 07/13/2013 1329   BUN 9 04/24/2013 0509   CREATININE 0.9 07/13/2013 1329   CREATININE 0.89 04/24/2013 0509   CALCIUM 9.7 07/13/2013 1329   CALCIUM 9.1 04/24/2013 0509   PROT 6.7 07/13/2013 1329   PROT 5.9* 04/16/2013 0426   PROT 6.9 04/06/2013 1114   ALBUMIN 4.0 07/13/2013 1329   ALBUMIN 3.0* 04/16/2013 0426   AST 18 07/13/2013 1329   AST 13 04/16/2013 0426   ALT 20 07/13/2013 1329   ALT 21 04/16/2013 0426   ALKPHOS 84 07/13/2013 1329  ALKPHOS 54 04/16/2013 0426   BILITOT 0.45 07/13/2013 1329   BILITOT 0.5 04/16/2013 0426   GFRNONAA 87* 04/24/2013 0509   GFRAA >90 04/24/2013 0509    No results found for this basename: SPEP, UPEP,  kappa and lambda light chains    Lab Results  Component Value Date   WBC 6.1 07/13/2013   NEUTROABS 3.4 07/13/2013   HGB 13.5 07/13/2013   HCT 38.6 07/13/2013   MCV 88.1 07/13/2013   PLT 152 07/13/2013      Chemistry      Component Value Date/Time   NA 143 07/13/2013 1329   NA 138 04/24/2013 0509   K 4.1 07/13/2013 1329   K 3.9 04/24/2013 0509   CL 105 04/24/2013 0509   CO2 24 07/13/2013 1329   CO2 24 04/24/2013 0509   BUN 18.6 07/13/2013 1329   BUN 9 04/24/2013 0509   CREATININE 0.9 07/13/2013 1329   CREATININE 0.89 04/24/2013 0509       Component Value Date/Time   CALCIUM 9.7 07/13/2013 1329   CALCIUM 9.1 04/24/2013 0509   ALKPHOS 84 07/13/2013 1329   ALKPHOS 54 04/16/2013 0426   AST 18 07/13/2013 1329   AST 13 04/16/2013 0426   ALT 20 07/13/2013 1329   ALT 21 04/16/2013 0426   BILITOT 0.45 07/13/2013 1329   BILITOT 0.5 04/16/2013 0426       RADIOGRAPHIC STUDIES: Skeletal survey showed no evidence of bone damage I have personally reviewed the radiological images as listed and agreed with the findings in the report.  ASSESSMENT & PLAN:  #1 IgA kappa MGUS I recommend history, physical examination and blood work only in 6 months to assess for possible disease progression. At present time, there is no evidence of anemia, renal failure, hypercalcemia all bone damage. If his blood work seems stable then, I will just see him on a yearly basis. I discussed with the patient and educated him about the natural history of MGUS/multiple myeloma #2 peripheral neuropathy I think this is due to severe alcoholism. I recommend the patient to reduce his alcohol intake.  Orders Placed This Encounter  Procedures  . SPEP & IFE with QIG    Standing Status: Future     Number of Occurrences:      Standing Expiration Date: 07/25/2014  . Kappa/lambda light chains    Standing Status: Future     Number of Occurrences:      Standing Expiration Date: 07/25/2014  . Beta 2 microglobuline, serum    Standing Status: Future     Number of Occurrences:      Standing Expiration Date: 07/25/2014  . Comprehensive metabolic panel    Standing Status: Future     Number of Occurrences:      Standing Expiration Date: 07/25/2014  . CBC with Differential    Standing Status: Future     Number of Occurrences:      Standing Expiration Date: 07/25/2014   All questions were answered. The patient knows to call the clinic with any problems, questions or concerns. No barriers to learning was detected. I spent 15 minutes counseling the patient face to face.  The total time spent in the appointment was 20 minutes and more than 50% was on counseling and review of test results     [facility], [place], MD 07/25/2013 2:11 PM

## 2013-07-25 NOTE — Telephone Encounter (Signed)
appts made and printed...td 

## 2013-08-04 ENCOUNTER — Ambulatory Visit: Payer: Medicare Other | Admitting: Neurology

## 2013-08-14 ENCOUNTER — Telehealth: Payer: Self-pay | Admitting: Neurology

## 2013-08-14 NOTE — Telephone Encounter (Signed)
Pt called stating Dr. Janann Colonel advised pt to call concerning his medication lidocaine (XYLOCAINE) 5 % ointment & pregabalin (LYRICA) 75 MG capsule, pt states he thinks it is helping some but the pain is hurting in both feet left is worse, pt states you all discussed Cymbalta in pt's last visit. Pt would like for Dr. Janann Colonel to call him concerning this matter.

## 2013-08-15 ENCOUNTER — Other Ambulatory Visit: Payer: Self-pay | Admitting: Neurology

## 2013-08-15 MED ORDER — PREGABALIN 75 MG PO CAPS: 75.0000 mg | ORAL_CAPSULE | Freq: Two times a day (BID) | ORAL | Status: DC

## 2013-08-15 MED ORDER — DULOXETINE HCL 30 MG PO CPEP: 30.0000 mg | ORAL_CAPSULE | Freq: Every day | ORAL | Status: DC

## 2013-08-15 NOTE — Telephone Encounter (Signed)
I sent a prescription for lyrica 75mg  twice a day to his pharmacy. There is unfortunately nothing he can do with the Lyrica 150mg  capsules. He can try calling his pharmacy but they likely will not take them back.

## 2013-08-15 NOTE — Telephone Encounter (Signed)
Please let him know to decrease the Lyrica to 75mg  twice a day (1 capsule twice a day) and start Cymbalta 30mg  daily. The prescription for Cymbalta was sent to his pharmacy. Thanks

## 2013-08-15 NOTE — Telephone Encounter (Signed)
Patient is calling concerning his medication that he takes the lidocaine and pregabalin. Patient states that the medication is helping but the pain is still hurting in both feet, left side is worst. Patient stated that Dr. Janann Colonel discussed cymbalta at last visit. Patient would like Dr. Janann Colonel to call back please. Thanks

## 2013-08-15 NOTE — Telephone Encounter (Signed)
Shared message from Dr Janann Colonel and patient said that he just got a prescription from pharmacy yesterday of lyricia -150mg  capsules, what should he do with those? Does he need a new prescription for the 75- mg?

## 2013-08-16 NOTE — Telephone Encounter (Signed)
Informed patient of Dr Hazle Quant note, he verbalized understanding

## 2013-08-29 ENCOUNTER — Other Ambulatory Visit: Payer: Self-pay | Admitting: Family Medicine

## 2013-08-29 DIAGNOSIS — E049 Nontoxic goiter, unspecified: Secondary | ICD-10-CM

## 2013-08-31 ENCOUNTER — Other Ambulatory Visit: Payer: Medicare Other

## 2013-09-04 ENCOUNTER — Ambulatory Visit
Admission: RE | Admit: 2013-09-04 | Discharge: 2013-09-04 | Disposition: A | Discharge status: Home / Self Care | Payer: Medicare Other | Source: Ambulatory Visit | Attending: Family Medicine | Admitting: Family Medicine

## 2013-09-04 DIAGNOSIS — E049 Nontoxic goiter, unspecified: Secondary | ICD-10-CM

## 2013-10-13 ENCOUNTER — Other Ambulatory Visit: Payer: Self-pay | Admitting: Family Medicine

## 2013-10-13 DIAGNOSIS — H02409 Unspecified ptosis of unspecified eyelid: Secondary | ICD-10-CM

## 2013-10-16 ENCOUNTER — Ambulatory Visit
Admission: RE | Admit: 2013-10-16 | Discharge: 2013-10-16 | Disposition: A | Discharge status: Home / Self Care | Payer: Medicare Other | Source: Ambulatory Visit | Attending: Family Medicine | Admitting: Family Medicine

## 2013-10-16 DIAGNOSIS — H02409 Unspecified ptosis of unspecified eyelid: Secondary | ICD-10-CM

## 2013-10-16 MED ORDER — GADOBENATE DIMEGLUMINE 529 MG/ML IV SOLN
20.0000 mL | Freq: Once | INTRAVENOUS | Status: AC | PRN
  Administered 2013-10-16: 20 mL via INTRAVENOUS

## 2013-10-19 ENCOUNTER — Telehealth: Payer: Self-pay | Admitting: Neurology

## 2013-10-19 NOTE — Telephone Encounter (Signed)
Patient called and stated DULoxetine (CYMBALTA) 30 MG capsule makes him very nauseated and drowsy.  He completed entire prescription and just couldntt tolerate it.   He has started back on his Lyrica at 150 mg.  Please call and advise

## 2013-10-19 NOTE — Telephone Encounter (Signed)
I called back.  Got no answer.  Left message.  

## 2013-10-20 ENCOUNTER — Other Ambulatory Visit: Payer: Medicare Other

## 2013-10-20 ENCOUNTER — Telehealth: Payer: Self-pay

## 2013-10-20 NOTE — Telephone Encounter (Signed)
Patient states he has taken all of the Cymbalta he was prescribed, but it made him very drowsy and nauseated.  He does not wish to continue taking this medication.  Says he still has Lyrica left and would like to start taking that again instead.  Please advise.  Thank you.

## 2013-10-20 NOTE — Telephone Encounter (Signed)
That is fine. We can send a new prescription if he needs one. Thanks.

## 2013-10-20 NOTE — Telephone Encounter (Signed)
I called the patient back.  Got no answer.  Left message relaying Dr Sabino Donovan note.

## 2013-11-06 ENCOUNTER — Ambulatory Visit (INDEPENDENT_AMBULATORY_CARE_PROVIDER_SITE_OTHER): Payer: Medicare Other | Admitting: Neurology

## 2013-11-06 ENCOUNTER — Encounter: Payer: Self-pay | Admitting: Neurology

## 2013-11-06 VITALS — BP 137/79 | HR 73 | Ht 73.0 in | Wt 254.0 lb

## 2013-11-06 DIAGNOSIS — G609 Hereditary and idiopathic neuropathy, unspecified: Secondary | ICD-10-CM

## 2013-11-06 MED ORDER — CARBAMAZEPINE 200 MG PO TABS: 100.0000 mg | ORAL_TABLET | Freq: Three times a day (TID) | ORAL | Status: DC

## 2013-11-06 NOTE — Progress Notes (Signed)
[facility]    Provider:  Dr Janann Colonel Referring Provider: [name], MD Primary Care Physician:  [name], MD  CC:  neuropathy  HPI:  Daniel Whitaker is a 68 y.o. male here as a follow from Dr. Brigitte Pulse for suspected peripheral neuropathy. Last visit was 07/2013 at which time he was tried on Cymbalta but could not tolerate so he went back to Lyrica, currently taking 150mg  twice a day. Has followed up with hem-onc for MGUS, they are scheduled to follow up with him. Had nerve biopsy consistent with a small fiber neuropathy of unclear etiology. States he drinks 15-20 drinks a week. (prior had said a "few" a week). Continues to have refractory pain, is interested in other options.    Initial visit 04/2013 Started 2 months ago, starting slowly and getting a more severe. Described as a burning type pain, on soles of feet, is constant pain, hurts more with his feet off when bare footed, his feet will occasionaly spasm. Not worse at night or when anything brushing againt his feet. No loss of strength in his feet. No ascending sensory changes. Has history fo RLS. The burning does not get better with movement. Notes episodes of lumbar radicular type pain in the past, this is occurring years ago. Reports having had EMG nerve conduction study in the past, thinks was around 4-5 years ago unsure what it showed but states it was abnormal.  Overall healthy, no DM. Prescribed Lyrica 75 mg twice a day by his primary care physician, initially noted benefit but has not noted any sustained benefit. Tolerating well no excessive fatigue.  Reviewed notes, labs and imaging from outside physicians, which showed normal TSH, normal hemoglobin A1c borderline low B12 of 304.  Review of Systems: Out of a complete 14 system review, the patient complains of only the following symptoms, and all other reviewed systems are negative. Positive for numbness, pain  History   Social History  . Marital  Status: Married    Spouse Name: [name]    Number of Children: 2  . Years of Education: GED   Occupational History  . Not on file.   Social History Main Topics  . Smoking status: Former Smoker    Quit date: 06/01/1984  . Smokeless tobacco: Never Used  . Alcohol Use: 0.0 oz/week     Comment: 12 BEERS WEEKLY  . Drug Use: No  . Sexual Activity: Not on file   Other Topics Concern  . Not on file   Social History Narrative   Patient lives at home with his wife [name])   Patient works full-time.   Patient has two children.   Patient is right-handed.   Patient has his GED.   Patient does not drink caffeine.    Family History  Problem Relation Age of Onset  . Brain cancer Mother   . Lung cancer Father     Past Medical History  Diagnosis Date  . Nontoxic uninodular goiter   . Peripheral neuropathy   . Esophageal reflux   . Erectile dysfunction   . Obesity   . Glaucoma   . Impaired fasting glucose   . Hypertriglyceridemia   . MGUS (monoclonal gammopathy of unknown significance)   . Neuropathy associated with MGUS 07/11/2013  . Unspecified deficiency anemia 07/11/2013    Past Surgical History  Procedure Laterality Date  . Knee surgery    . Colon resection N/A 04/21/2013    Procedure: DIAGNOSTIC LAPAROSCOPY, lysis of adhesions for partial small bowel obstruction;  Surgeon: Odis Hollingshead, MD;  Location: [facility];  Service: General;  Laterality: N/A;  . Small intestine surgery      Current Outpatient Prescriptions  Medication Sig Dispense Refill  . cetirizine (ZYRTEC) 10 MG tablet Take 10 mg by mouth daily.      . Cholecalciferol (VITAMIN D) 2000 UNITS CAPS Take 2,000 Units by mouth.      . latanoprost (XALATAN) 0.005 % ophthalmic solution       . lidocaine (XYLOCAINE) 5 % ointment Apply 1 application topically 2 (two) times daily as needed.  35.44 g  3  . meloxicam (MOBIC) 15 MG tablet       . multivitamin-iron-minerals-folic acid (CENTRUM) chewable tablet Chew 1  tablet by mouth daily.      . nabumetone (RELAFEN) 500 MG tablet       . Omega-3 Fatty Acids (FISH OIL) 1000 MG CAPS Take 1,000 mg by mouth daily. 3      . pantoprazole (PROTONIX) 40 MG tablet       . pregabalin (LYRICA) 75 MG capsule Take 150 mg by mouth 2 (two) times daily.       No current facility-administered medications for this visit.    Allergies as of 11/06/2013 - Review Complete 07/25/2013  Allergen Reaction Noted  . Penicillins  04/06/2013    Vitals: BP 137/79  Pulse 73  Ht 6\' 1"  (1.854 m)  Wt 254 lb (115.214 kg)  BMI 33.52 kg/m2 Last Weight:  Wt Readings from Last 1 Encounters:  11/06/13 254 lb (115.214 kg)   Last Height:   Ht Readings from Last 1 Encounters:  11/06/13 6\' 1"  (1.854 m)     Physical exam: Exam: Gen: NAD, conversant Eyes: anicteric sclerae, moist conjunctivae HENT: Atraumatic, oropharynx clear Neck: Trachea midline; supple,  Lungs: CTA, no wheezing, rales, rhonic                          CV: RRR, no MRG Abdomen: Soft, non-tender;  Extremities: No peripheral edema  Skin: Normal temperature, no rash,  Psych: Appropriate affect, pleasant  Neuro: MS: AA&Ox3, appropriately interactive, normal affect   Speech: fluent w/o paraphasic error  Memory: good recent and remote recall  CN: PERRL, EOMI no nystagmus, no ptosis, sensation intact to LT V1-V3 bilat, face symmetric, no weakness, hearing grossly intact, palate elevates symmetrically, shoulder shrug 5/5 bilat,  tongue protrudes midline, no fasiculations noted.  Motor: normal bulk and tone Strength: 5/5  In all extremities, limited flexion ROM L great toe  Coord: rapid alternating and point-to-point (FNF, HTS) movements intact.  Reflexes: symmetrical, bilat downgoing toes  Sens: LT intact in all extremities, decreased temp, vibration, PP, proprioception bilat LE L>R  Gait: posture, stance, stride and arm-swing normal.   Assessment:  After physical and neurologic examination, review  of laboratory studies, imaging, neurophysiology testing and pre-existing records, assessment will be reviewed on the problem list.  Plan:  Treatment plan and additional workup will be reviewed under Problem List.  1)peripheral neuropathy  67y/o gentleman presenting for followup evaluation of his peripheral neuropathy. Has had a nerve biopsy completed, which confirmed diagnosis of small fiber neuropathy. Currently taking Lyrica 150 mg twice a day with some relief. Scheduled to followup with oncology for further monitoring of MGUS. Suspect that his heavy EtOH usage is the major cause of his symptoms. Counseled patient on importance of decreasing his EtOH intake. He expressed understanding. Will try tegretol 100mg  BID for symptomatic relief. Follow  up as needed.

## 2013-11-22 ENCOUNTER — Telehealth: Payer: Self-pay | Admitting: Neurology

## 2013-11-22 NOTE — Telephone Encounter (Signed)
I called the patient back.  Got no answer.  Left message. 

## 2013-11-22 NOTE — Telephone Encounter (Signed)
Pt states his medication carbamazepine (TEGRETOL) 200 MG tablet, hands are jerky and dizziness. Pt would like to speak with someone about this on either trying something else or lowering the dosage. Thanks

## 2013-11-22 NOTE — Telephone Encounter (Signed)
Patient told operator Daniel Whitaker is causing his hands to jerk and is also making him dizzy.  He would like to know if the dose should be lowered, or if med can be changed.  I tried to call the patient back at numbers we have listed, with no answer on any of the lines.  Please advise.  Thank you.

## 2013-11-22 NOTE — Telephone Encounter (Signed)
Please have him stop the medication for now and then call us in 1 week. I would like to give the medication time to get out of his system and then we can consider a different option. Thanks.

## 2013-12-12 ENCOUNTER — Other Ambulatory Visit: Payer: Self-pay

## 2013-12-12 MED ORDER — PREGABALIN 150 MG PO CAPS: 150.0000 mg | ORAL_CAPSULE | Freq: Two times a day (BID) | ORAL | Status: DC

## 2013-12-13 NOTE — Telephone Encounter (Signed)
Rx has been faxed.

## 2014-01-15 ENCOUNTER — Other Ambulatory Visit (HOSPITAL_BASED_OUTPATIENT_CLINIC_OR_DEPARTMENT_OTHER): Payer: Medicare Other

## 2014-01-15 DIAGNOSIS — D649 Anemia, unspecified: Secondary | ICD-10-CM

## 2014-01-15 DIAGNOSIS — D472 Monoclonal gammopathy: Secondary | ICD-10-CM

## 2014-01-15 LAB — CBC WITH DIFFERENTIAL/PLATELET
BASO%: 1.5 % (ref 0.0–2.0)
BASOS ABS: 0.1 10*3/uL (ref 0.0–0.1)
EOS%: 3.7 % (ref 0.0–7.0)
Eosinophils Absolute: 0.2 10*3/uL (ref 0.0–0.5)
HEMATOCRIT: 41.6 % (ref 38.4–49.9)
HGB: 14 g/dL (ref 13.0–17.1)
LYMPH#: 1.8 10*3/uL (ref 0.9–3.3)
LYMPH%: 32 % (ref 14.0–49.0)
MCH: 31.1 pg (ref 27.2–33.4)
MCHC: 33.7 g/dL (ref 32.0–36.0)
MCV: 92.2 fL (ref 79.3–98.0)
MONO#: 0.4 10*3/uL (ref 0.1–0.9)
MONO%: 7.1 % (ref 0.0–14.0)
NEUT#: 3.2 10*3/uL (ref 1.5–6.5)
NEUT%: 55.7 % (ref 39.0–75.0)
Platelets: 185 10*3/uL (ref 140–400)
RBC: 4.51 10*6/uL (ref 4.20–5.82)
RDW: 13.1 % (ref 11.0–14.6)
WBC: 5.7 10*3/uL (ref 4.0–10.3)

## 2014-01-15 LAB — COMPREHENSIVE METABOLIC PANEL (CC13)
ALT: 17 U/L (ref 0–55)
AST: 19 U/L (ref 5–34)
Albumin: 4 g/dL (ref 3.5–5.0)
Alkaline Phosphatase: 78 U/L (ref 40–150)
Anion Gap: 8 mEq/L (ref 3–11)
BILIRUBIN TOTAL: 0.52 mg/dL (ref 0.20–1.20)
BUN: 22.3 mg/dL (ref 7.0–26.0)
CO2: 22 mEq/L (ref 22–29)
CREATININE: 0.9 mg/dL (ref 0.7–1.3)
Calcium: 9 mg/dL (ref 8.4–10.4)
Chloride: 111 mEq/L — ABNORMAL HIGH (ref 98–109)
Glucose: 109 mg/dl (ref 70–140)
Potassium: 4.2 mEq/L (ref 3.5–5.1)
SODIUM: 141 meq/L (ref 136–145)
Total Protein: 6.9 g/dL (ref 6.4–8.3)

## 2014-01-17 LAB — KAPPA/LAMBDA LIGHT CHAINS
KAPPA FREE LGHT CHN: 1.53 mg/dL (ref 0.33–1.94)
Kappa:Lambda Ratio: 1.59 (ref 0.26–1.65)
LAMBDA FREE LGHT CHN: 0.96 mg/dL (ref 0.57–2.63)

## 2014-01-17 LAB — SPEP & IFE WITH QIG
ALBUMIN ELP: 62.2 % (ref 55.8–66.1)
ALPHA-1-GLOBULIN: 3.5 % (ref 2.9–4.9)
ALPHA-2-GLOBULIN: 8 % (ref 7.1–11.8)
Beta 2: 6.9 % — ABNORMAL HIGH (ref 3.2–6.5)
Beta Globulin: 6.9 % (ref 4.7–7.2)
Gamma Globulin: 12.5 % (ref 11.1–18.8)
IGM, SERUM: 61 mg/dL (ref 41–251)
IgA: 387 mg/dL — ABNORMAL HIGH (ref 68–379)
IgG (Immunoglobin G), Serum: 807 mg/dL (ref 650–1600)
M-Spike, %: 0.2 g/dL
TOTAL PROTEIN, SERUM ELECTROPHOR: 6.8 g/dL (ref 6.0–8.3)

## 2014-01-17 LAB — BETA 2 MICROGLOBULIN, SERUM: BETA 2 MICROGLOBULIN: 1.88 mg/L (ref <=2.51)

## 2014-01-22 ENCOUNTER — Encounter: Payer: Self-pay | Admitting: Hematology and Oncology

## 2014-01-22 ENCOUNTER — Ambulatory Visit (HOSPITAL_BASED_OUTPATIENT_CLINIC_OR_DEPARTMENT_OTHER): Payer: Medicare Other | Admitting: Hematology and Oncology

## 2014-01-22 VITALS — BP 132/61 | HR 75 | Temp 97.7°F | Resp 18 | Ht 73.0 in | Wt 250.0 lb

## 2014-01-22 DIAGNOSIS — G629 Polyneuropathy, unspecified: Secondary | ICD-10-CM

## 2014-01-22 DIAGNOSIS — G609 Hereditary and idiopathic neuropathy, unspecified: Secondary | ICD-10-CM

## 2014-01-22 DIAGNOSIS — D472 Monoclonal gammopathy: Secondary | ICD-10-CM

## 2014-01-22 NOTE — Assessment & Plan Note (Signed)
I suspect this is related to chronic alcohol intake. I recommended patient to discontinue alcohol intake. He will continue taking Lyrica for this.

## 2014-01-22 NOTE — Assessment & Plan Note (Signed)
Clinically, he has no presence of disease progression. I plan to see him on a yearly basis with history, physical examination and blood work. I recommend influenza vaccination.   

## 2014-01-22 NOTE — Progress Notes (Signed)
[place] OFFICE PROGRESS NOTE  Patient Care Team: [name], MD as PCP - General (Family Medicine)  SUMMARY OF ONCOLOGIC HISTORY: This is a pleasant patient with severe peripheral neuropathy was referred here for evaluation of abnormal serum protein electrophoresis. Further studies revealed monoclonal gammopathy of unknown significance that is not the cause of his peripheral neuropathy, IgA kappa subtype. The peripheral neuropathy was thought to be related to alcoholism. He is being observed.  INTERVAL HISTORY: Please see below for problem oriented charting. From neuropathic pain, he has no new symptoms. Denies recent bone pain. No recent infection.  REVIEW OF SYSTEMS:   Constitutional: Denies fevers, chills or abnormal weight loss Eyes: Denies blurriness of vision Ears, nose, mouth, throat, and face: Denies mucositis or sore throat Respiratory: Denies cough, dyspnea or wheezes Cardiovascular: Denies palpitation, chest discomfort or lower extremity swelling Gastrointestinal:  Denies nausea, heartburn or change in bowel habits Skin: Denies abnormal skin rashes Lymphatics: Denies new lymphadenopathy or easy bruising Behavioral/Psych: Mood is stable, no new changes  All other systems were reviewed with the patient and are negative.  I have reviewed the past medical history, past surgical history, social history and family history with the patient and they are unchanged from previous note.  ALLERGIES:  is allergic to penicillins.  MEDICATIONS:  Current Outpatient Prescriptions  Medication Sig Dispense Refill  . carbamazepine (TEGRETOL) 200 MG tablet Take 0.5 tablets (100 mg total) by mouth 3 (three) times daily.  30 tablet  6  . cetirizine (ZYRTEC) 10 MG tablet Take 10 mg by mouth daily.      . Cholecalciferol (VITAMIN D) 2000 UNITS CAPS Take 2,000 Units by mouth.      . latanoprost (XALATAN) 0.005 % ophthalmic solution       . lidocaine (XYLOCAINE) 5 %  ointment Apply 1 application topically 2 (two) times daily as needed.  35.44 g  3  . meloxicam (MOBIC) 15 MG tablet       . multivitamin-iron-minerals-folic acid (CENTRUM) chewable tablet Chew 1 tablet by mouth daily.      . nabumetone (RELAFEN) 500 MG tablet Pt has not taken for 4 weeks now      . Omega-3 Fatty Acids (FISH OIL) 1000 MG CAPS Take 1,000 mg by mouth daily. 3      . pantoprazole (PROTONIX) 40 MG tablet       . pregabalin (LYRICA) 150 MG capsule Take 1 capsule (150 mg total) by mouth 2 (two) times daily.  60 capsule  3   No current facility-administered medications for this visit.    PHYSICAL EXAMINATION: ECOG PERFORMANCE STATUS: 0 - Asymptomatic  Filed Vitals:   01/22/14 1004  BP: 132/61  Pulse: 75  Temp: 97.7 F (36.5 C)  Resp: 18   Filed Weights   01/22/14 1004  Weight: 250 lb (113.399 kg)    GENERAL:alert, no distress and comfortable SKIN: skin color, texture, turgor are normal, no rashes or significant lesions EYES: normal, Conjunctiva are pink and non-injected, sclera clear Musculoskeletal:no cyanosis of digits and no clubbing  NEURO: alert & oriented x 3 with fluent speech, no focal motor/sensory deficits  LABORATORY DATA:  I have reviewed the data as listed    Component Value Date/Time   NA 141 01/15/2014 1007   NA 138 04/24/2013 0509   K 4.2 01/15/2014 1007   K 3.9 04/24/2013 0509   CL 105 04/24/2013 0509   CO2 22 01/15/2014 1007   CO2 24 04/24/2013 0509   GLUCOSE  109 01/15/2014 1007   GLUCOSE 106* 04/24/2013 0509   BUN 22.3 01/15/2014 1007   BUN 9 04/24/2013 0509   CREATININE 0.9 01/15/2014 1007   CREATININE 0.89 04/24/2013 0509   CALCIUM 9.0 01/15/2014 1007   CALCIUM 9.1 04/24/2013 0509   PROT 6.9 01/15/2014 1007   PROT 5.9* 04/16/2013 0426   PROT 6.9 04/06/2013 1114   ALBUMIN 4.0 01/15/2014 1007   ALBUMIN 3.0* 04/16/2013 0426   AST 19 01/15/2014 1007   AST 13 04/16/2013 0426   ALT 17 01/15/2014 1007   ALT 21 04/16/2013 0426   ALKPHOS 78  01/15/2014 1007   ALKPHOS 54 04/16/2013 0426   BILITOT 0.52 01/15/2014 1007   BILITOT 0.5 04/16/2013 0426   GFRNONAA 87* 04/24/2013 0509   GFRAA >90 04/24/2013 0509    No results found for this basename: SPEP,  UPEP,   kappa and lambda light chains    Lab Results  Component Value Date   WBC 5.7 01/15/2014   NEUTROABS 3.2 01/15/2014   HGB 14.0 01/15/2014   HCT 41.6 01/15/2014   MCV 92.2 01/15/2014   PLT 185 01/15/2014      Chemistry      Component Value Date/Time   NA 141 01/15/2014 1007   NA 138 04/24/2013 0509   K 4.2 01/15/2014 1007   K 3.9 04/24/2013 0509   CL 105 04/24/2013 0509   CO2 22 01/15/2014 1007   CO2 24 04/24/2013 0509   BUN 22.3 01/15/2014 1007   BUN 9 04/24/2013 0509   CREATININE 0.9 01/15/2014 1007   CREATININE 0.89 04/24/2013 0509      Component Value Date/Time   CALCIUM 9.0 01/15/2014 1007   CALCIUM 9.1 04/24/2013 0509   ALKPHOS 78 01/15/2014 1007   ALKPHOS 54 04/16/2013 0426   AST 19 01/15/2014 1007   AST 13 04/16/2013 0426   ALT 17 01/15/2014 1007   ALT 21 04/16/2013 0426   BILITOT 0.52 01/15/2014 1007   BILITOT 0.5 04/16/2013 0426    ASSESSMENT & PLAN:  MGUS (monoclonal gammopathy of unknown significance) Clinically, he has no presence of disease progression. I plan to see him on a yearly basis with history, physical examination and blood work. I recommend influenza vaccination.  Neuropathy I suspect this is related to chronic alcohol intake. I recommended patient to discontinue alcohol intake. He will continue taking Lyrica for this.    Orders Placed This Encounter  Procedures  . CBC with Differential    Standing Status: Future     Number of Occurrences:      Standing Expiration Date: 02/26/2015  . Comprehensive metabolic panel    Standing Status: Future     Number of Occurrences:      Standing Expiration Date: 02/26/2015  . SPEP & IFE with QIG    Standing Status: Future     Number of Occurrences:      Standing Expiration Date: 02/26/2015  .  Kappa/lambda light chains    Standing Status: Future     Number of Occurrences:      Standing Expiration Date: 02/26/2015  . Beta 2 microglobulin, serum    Standing Status: Future     Number of Occurrences:      Standing Expiration Date: 02/26/2015   All questions were answered. The patient knows to call the clinic with any problems, questions or concerns. No barriers to learning was detected. I spent 15 minutes counseling the patient face to face. The total time spent in the appointment was 20  minutes and more than 50% was on counseling and review of test results     [facility], Mamta Rimmer, MD 01/22/2014 10:31 AM

## 2014-01-23 ENCOUNTER — Telehealth: Payer: Self-pay | Admitting: Hematology and Oncology

## 2014-01-23 NOTE — Telephone Encounter (Signed)
s.w. pt and advised on Aug 2016 appt....pt ok adn aware

## 2014-02-07 ENCOUNTER — Encounter: Payer: Self-pay | Admitting: Neurology

## 2014-03-06 ENCOUNTER — Telehealth: Payer: Self-pay | Admitting: *Deleted

## 2014-03-06 NOTE — Telephone Encounter (Signed)
Spoke to patient and he relayed that he is a friend of Dr. Erling Cruz and he suggested he see Dr. Jannifer Franklin and not Dr. Jaynee Eagles.  The patient has cancelled 2 appointments with Dr. Jaynee Eagles.  I relayed I would ask Dr. Jannifer Franklin if he would accept him as a patient.  Please advise.

## 2014-03-06 NOTE — Telephone Encounter (Signed)
I will be happy to see this patient.

## 2014-03-07 ENCOUNTER — Ambulatory Visit: Payer: Medicare Other | Admitting: Neurology

## 2014-03-07 ENCOUNTER — Telehealth: Payer: Self-pay | Admitting: *Deleted

## 2014-03-07 NOTE — Telephone Encounter (Signed)
Appointment scheduled.

## 2014-03-21 ENCOUNTER — Telehealth: Payer: Self-pay | Admitting: Neurology

## 2014-03-21 ENCOUNTER — Encounter: Payer: Self-pay | Admitting: Neurology

## 2014-03-21 ENCOUNTER — Ambulatory Visit (INDEPENDENT_AMBULATORY_CARE_PROVIDER_SITE_OTHER): Payer: Medicare Other | Admitting: Neurology

## 2014-03-21 ENCOUNTER — Encounter (INDEPENDENT_AMBULATORY_CARE_PROVIDER_SITE_OTHER): Payer: Self-pay

## 2014-03-21 VITALS — BP 142/77 | HR 73 | Ht 73.0 in | Wt 245.5 lb

## 2014-03-21 DIAGNOSIS — D472 Monoclonal gammopathy: Secondary | ICD-10-CM

## 2014-03-21 DIAGNOSIS — G629 Polyneuropathy, unspecified: Secondary | ICD-10-CM

## 2014-03-21 MED ORDER — PREGABALIN 200 MG PO CAPS
200.0000 mg | ORAL_CAPSULE | Freq: Two times a day (BID) | ORAL | Status: DC
Start: 2014-03-21 — End: 2014-11-15

## 2014-03-21 NOTE — Telephone Encounter (Signed)
Patient received new Rx for pregabalin (LYRICA) 200 MG capsule today.  He had old Rx of Lyrica 150 mg refilled on Monday for 150.00.  Questioning if he could continue taking 150 mg before getting new Rx.  Please call and advise.

## 2014-03-21 NOTE — Progress Notes (Signed)
Reason for visit: Peripheral neuropathy  Daniel Whitaker is an 68 y.o. male  History of present illness:  Daniel Whitaker is a 68 year old white male with a history of a peripheral neuropathy associated with a monoclonal gammopathy. The patient is followed through hematology, and he has undergone a bone marrow biopsy that did not show evidence of malignancy. He is having increasing problems with discomfort in the feet. The patient began having problems with neuropathy approximately 14 months ago. The patient has bilateral carpal tunnel syndrome. The patient indicates that he feels better when he is wearing shoes. 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 The patient fortunately has not had any falls. The patient is on Lyrica taking 150 mg twice daily, but this is not effective for controlling his pain. In the past, he could not tolerate Cymbalta secondary to drowsiness and nausea. He returns to this office for an evaluation. He denies any significant issues with low back pain.  Past Medical History  Diagnosis Date  . Nontoxic uninodular goiter   . Peripheral neuropathy   . Esophageal reflux   . Erectile dysfunction   . Obesity   . Glaucoma   . Impaired fasting glucose   . Hypertriglyceridemia   . MGUS (monoclonal gammopathy of unknown significance)   . Neuropathy associated with MGUS 07/11/2013  . Unspecified deficiency anemia 07/11/2013    Past Surgical History  Procedure Laterality Date  . Knee surgery    . Colon resection N/A 04/21/2013    Procedure: DIAGNOSTIC LAPAROSCOPY, lysis of adhesions for partial small bowel obstruction;  Surgeon: Odis Hollingshead, MD;  Location: [facility];  Service: General;  Laterality: N/A;  . Small intestine surgery      Family History  Problem Relation Age of Onset  . Brain cancer Mother   . Lung cancer Father   . Heart disease Brother   . Dementia  Brother   . Neuropathy Neg Hx     Social history:  reports that he quit smoking about 29 years ago. He has never used smokeless tobacco. He reports that he drinks alcohol. He reports that he does not use illicit drugs.    Allergies  Allergen Reactions  . Cymbalta [Duloxetine Hcl]     Drowsiness, nausea  . Penicillins     Medications:  Current Outpatient Prescriptions on File Prior to Visit  Medication Sig Dispense Refill  . cetirizine (ZYRTEC) 10 MG tablet Take 10 mg by mouth daily.      . Cholecalciferol (VITAMIN D) 2000 UNITS CAPS Take 2,000 Units by mouth.      . latanoprost (XALATAN) 0.005 % ophthalmic solution       . lidocaine (XYLOCAINE) 5 % ointment Apply 1 application topically 2 (two) times daily as needed.  35.44 g  3  . meloxicam (MOBIC) 15 MG tablet       . multivitamin-iron-minerals-folic acid (CENTRUM) chewable tablet Chew 1 tablet by mouth daily.      . Omega-3 Fatty Acids (FISH OIL) 1000 MG CAPS Take 1,000 mg by mouth daily. 3      . pantoprazole (PROTONIX) 40 MG tablet        No current facility-administered medications on file prior to visit.    ROS:  Out of a complete 14 system review of symptoms, the patient complains only of the following symptoms, and all other reviewed  systems are negative.  Hearing loss, ringing in the ears Urinary urgency Restless legs, daytime sleepiness Left-sided ptosis  Blood pressure 142/77, pulse 73, height '6\' 1"'  (1.854 m), weight 245 lb 8 oz (111.358 kg).  Physical Exam  General: The patient is alert and cooperative at the time of the examination.  Skin: No significant peripheral edema is noted.   Neurologic Exam  Mental status: The patient is oriented x 3.  Cranial nerves: Facial symmetry is present. Speech is normal, no aphasia or dysarthria is noted. Extraocular movements are full. Visual fields are full.  Motor: The patient has good strength in all 4 extremities. The patient is able to walk on heels and the  toes bilaterally.  Sensory examination: There is a stocking pattern pinprick sensory deficit one half way up the legs bilaterally.  Coordination: The patient has good finger-nose-finger and heel-to-shin bilaterally.  Gait and station: The patient has a normal gait. Tandem gait is normal. Romberg is negative. No drift is seen.  Reflexes: Deep tendon reflexes are symmetric, but are depressed.   Assessment/Plan:  One. Peripheral neuropathy   2. Mild gait disorder  I have indicated that the patient needs to be careful with his work-related activities. He is at high risk for falls if he is up on ladders or up on a roof. The patient will be increased on Lyrica taking 200 mg twice daily. In the future, we may add amitriptyline or nortriptyline to this regimen. The patient will followup in about 3-4 months. He will contact me if the Lyrica dosing is not adequate. So far, he is tolerating the medication well.  Jill Alexanders MD 03/21/2014 7:35 PM  [facility] [address] [place], [place] [address]  Phone [phone] Fax [phone]

## 2014-03-21 NOTE — Telephone Encounter (Signed)
I called patient. He is to go on the 200 mg Lyrica twice daily. He is to save the 150 mg tablets as we may use these to go up on the medication in the future.

## 2014-03-21 NOTE — Patient Instructions (Signed)

## 2014-04-18 ENCOUNTER — Encounter: Payer: Self-pay | Admitting: Neurology

## 2014-04-24 ENCOUNTER — Encounter: Payer: Self-pay | Admitting: Neurology

## 2014-07-24 ENCOUNTER — Ambulatory Visit (INDEPENDENT_AMBULATORY_CARE_PROVIDER_SITE_OTHER): Payer: Medicare Other | Admitting: Neurology

## 2014-07-24 ENCOUNTER — Encounter: Payer: Self-pay | Admitting: Neurology

## 2014-07-24 VITALS — BP 145/76 | HR 73 | Ht 73.0 in | Wt 258.4 lb

## 2014-07-24 DIAGNOSIS — G609 Hereditary and idiopathic neuropathy, unspecified: Secondary | ICD-10-CM

## 2014-07-24 DIAGNOSIS — D472 Monoclonal gammopathy: Secondary | ICD-10-CM

## 2014-07-24 NOTE — Progress Notes (Signed)
Reason for visit: Peripheral neuropathy  Daniel Whitaker is an 69 y.o. 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 The patient has been seen by hematology previously. The patient continues to have worsening numbness, but his pain is coming under better control with the use of Lyrica. He is sleeping well at night. He denies any falls. His work requires that he go up on a stepladder. He is no longer working at heights otherwise. He occasional have some shooting pains in the feet. He returns for an evaluation.  Past Medical History  Diagnosis Date  . Nontoxic uninodular goiter   . Peripheral neuropathy   . Esophageal reflux   . Erectile dysfunction   . Obesity   . Glaucoma   . Impaired fasting glucose   . Hypertriglyceridemia   . MGUS (monoclonal gammopathy of unknown significance)   . Neuropathy associated with MGUS 07/11/2013  . Unspecified deficiency anemia 07/11/2013    Past Surgical History  Procedure Laterality Date  . Knee surgery    . Colon resection N/A 04/21/2013    Procedure: DIAGNOSTIC LAPAROSCOPY, lysis of adhesions for partial small bowel obstruction;  Surgeon: Odis Hollingshead, MD;  Location: [facility];  Service: General;  Laterality: N/A;  . Small intestine surgery      Family History  Problem Relation Age of Onset  . Brain cancer Mother   . Lung cancer Father   . Heart disease Brother   . Dementia Brother   . Neuropathy Neg Hx     Social history:  reports that he quit smoking about 30 years ago. He has never used smokeless tobacco. He reports that he drinks alcohol. He reports that he does not use illicit drugs.    Allergies  Allergen Reactions  . Cymbalta [Duloxetine Hcl]     Drowsiness, nausea  . Penicillins     Medications:  Current Outpatient Prescriptions on File Prior to Visit  Medication Sig Dispense Refill  . cetirizine (ZYRTEC)  10 MG tablet Take 10 mg by mouth daily.    . Cholecalciferol (VITAMIN D) 2000 UNITS CAPS Take 2,000 Units by mouth.    . latanoprost (XALATAN) 0.005 % ophthalmic solution     . lidocaine (XYLOCAINE) 5 % ointment Apply 1 application topically 2 (two) times daily as needed. 35.44 g 3  . meloxicam (MOBIC) 15 MG tablet     . multivitamin-iron-minerals-folic acid (CENTRUM) chewable tablet Chew 1 tablet by mouth daily.    . Omega-3 Fatty Acids (FISH OIL) 1000 MG CAPS Take 1,000 mg by mouth daily. 3    . pantoprazole (PROTONIX) 40 MG tablet     . pregabalin (LYRICA) 200 MG capsule Take 1 capsule (200 mg total) by mouth 2 (two) times daily. 60 capsule 4   No current facility-administered medications on file prior to visit.    ROS:  Out of a complete 14 system review of symptoms, the patient complains only of the following symptoms, and all other reviewed systems are negative.  Hearing loss, ringing in the ears Shortness of breath Restless legs Urinary urgency Joint pain, muscle cramps Left eye drooping  Blood pressure 145/76, pulse 73, height 6\' 1"  (1.854 m), weight 258 lb 6.4 oz (117.209 kg).  Physical Exam  General: The patient is alert and cooperative at the time of the examination.  Skin: No significant peripheral edema is noted.   Neurologic Exam  Mental status: The patient is oriented x 3.  Cranial nerves: Facial symmetry is present. Speech is normal, no aphasia or dysarthria is noted. Extraocular movements are full. Visual fields are full.  Motor: The patient has good strength in all 4 extremities.  Sensory examination: Soft touch sensation is symmetric on the face, arms, and legs.  Coordination: The patient has good finger-nose-finger and heel-to-shin bilaterally.  Gait and station: The patient has a normal gait. Tandem gait is normal. Romberg is negative. No drift is seen. The patient is able to walk on heels and the toes bilaterally.  Reflexes: Deep tendon reflexes  are symmetric, but are depressed.   Assessment/Plan:  1. Peripheral neuropathy  The patient is doing relatively well with his current balance issues. He is having some discomfort, but the Lyrica is allowing him to rest well at night. He will continue the current dose of medications, he will follow-up in about 6 months. He will contact me if any new issues arise.  Jill Alexanders MD 07/24/2014 8:57 PM  [facility] [address] [place], [place] [address]  Phone [phone] Fax [phone]

## 2014-07-24 NOTE — Patient Instructions (Signed)

## 2014-11-15 ENCOUNTER — Other Ambulatory Visit: Payer: Self-pay

## 2014-11-15 MED ORDER — PREGABALIN 200 MG PO CAPS: 200.0000 mg | ORAL_CAPSULE | Freq: Two times a day (BID) | ORAL | Status: DC

## 2014-11-15 NOTE — Telephone Encounter (Signed)
Rx signed and faxed.

## 2014-11-28 ENCOUNTER — Other Ambulatory Visit: Payer: Self-pay | Admitting: Family Medicine

## 2014-11-28 ENCOUNTER — Ambulatory Visit
Admission: RE | Admit: 2014-11-28 | Discharge: 2014-11-28 | Disposition: A | Discharge status: Home / Self Care | Payer: Medicare Other | Source: Ambulatory Visit | Attending: Family Medicine | Admitting: Family Medicine

## 2014-11-28 DIAGNOSIS — E041 Nontoxic single thyroid nodule: Secondary | ICD-10-CM

## 2014-12-04 ENCOUNTER — Other Ambulatory Visit: Payer: Self-pay

## 2015-01-04 IMAGING — CT CT ABD-PELV W/ CM
1 of 3 series · 14 of 32 positions shown, 19 images · IV contrast (OMNIPAQUE 300)
Comparison: None.

CLINICAL DATA: Possible small bowel obstruction. Nausea and
vomiting.

EXAM:
CT ABDOMEN AND PELVIS WITH CONTRAST
TECHNIQUE: Multidetector CT imaging of the abdomen and pelvis was performed
using the standard protocol following bolus administration of
intravenous contrast.
CONTRAST:  100mL OMNIPAQUE IOHEXOL 300 MG/ML  SOLN

[Series 2: abd/pel with · axial · 0.80mm/px · z∈[-497,-37]mm · 14 of 104 slices shown, 19 images]
[im 6/104  soft-tissue]
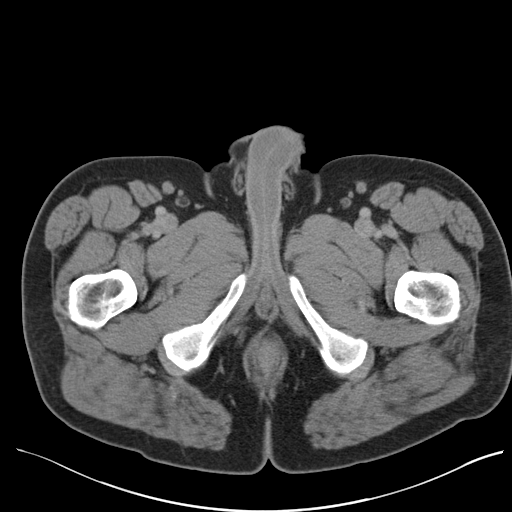
[im 6/104  bone]
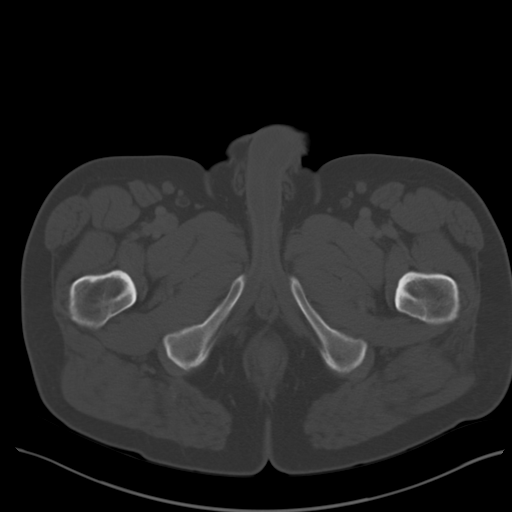
[im 17/104  soft-tissue]
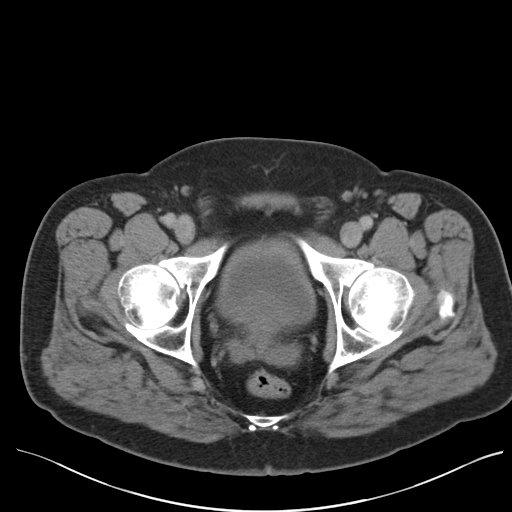
[im 22/104  soft-tissue]
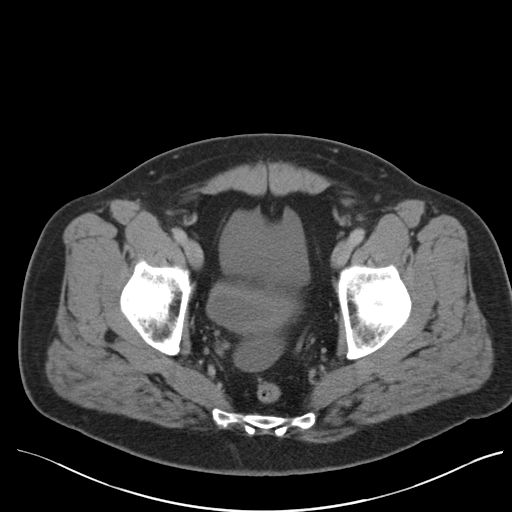
[im 28/104  soft-tissue]
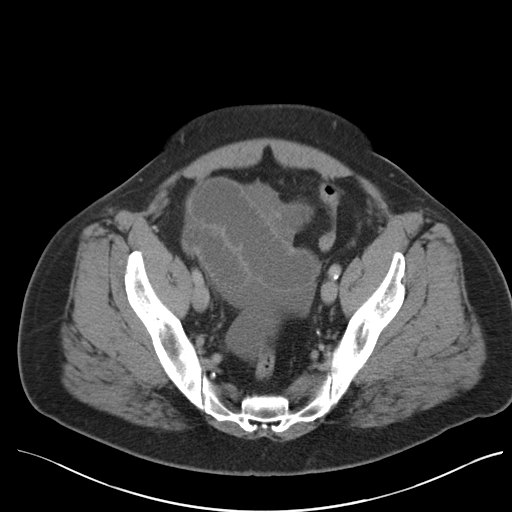
[im 38/104  soft-tissue]
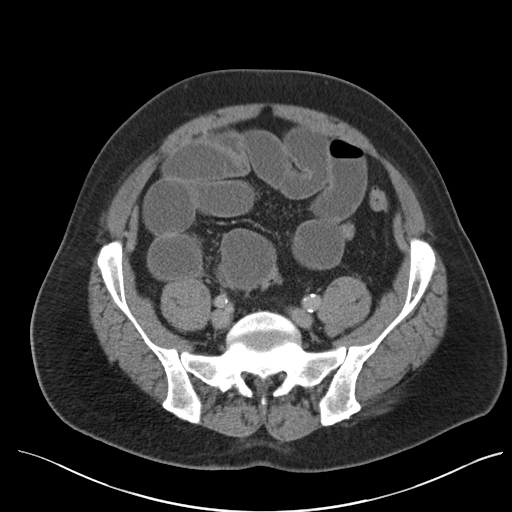
[im 44/104  soft-tissue]
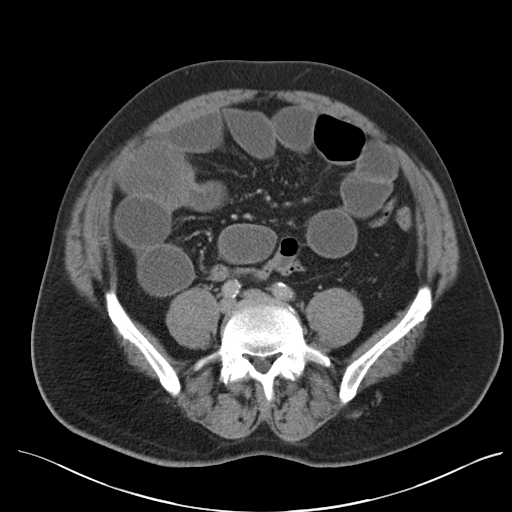
[im 55/104  soft-tissue]
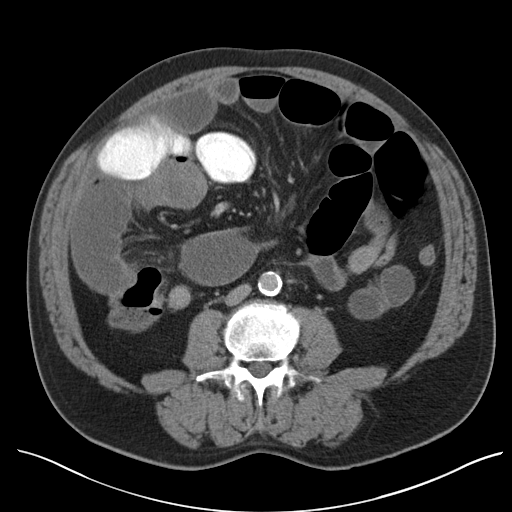
[im 60/104  soft-tissue]
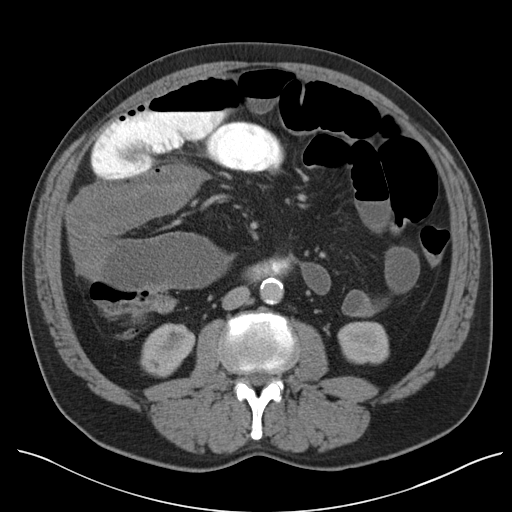
[im 66/104  soft-tissue]
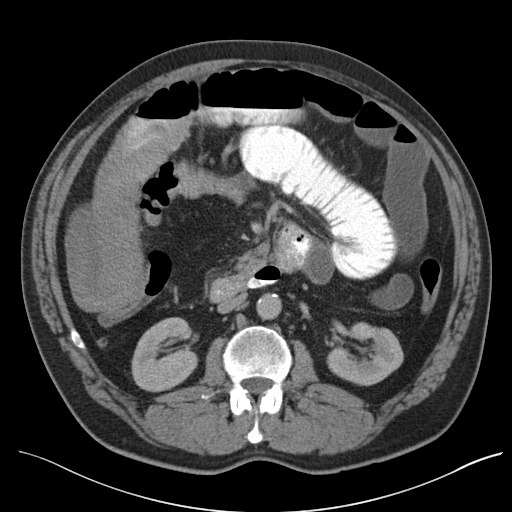
[im 66/104  bone]
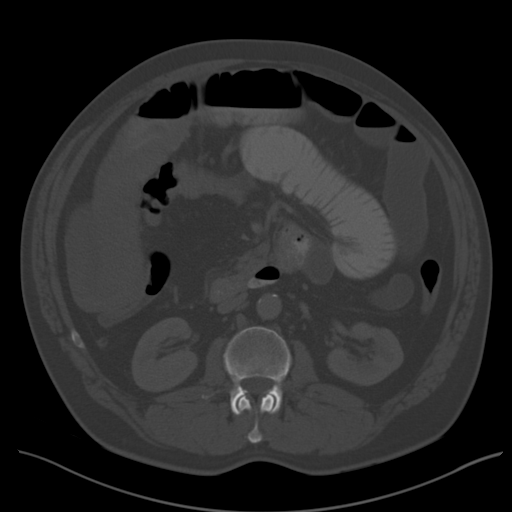
[im 76/104  soft-tissue]
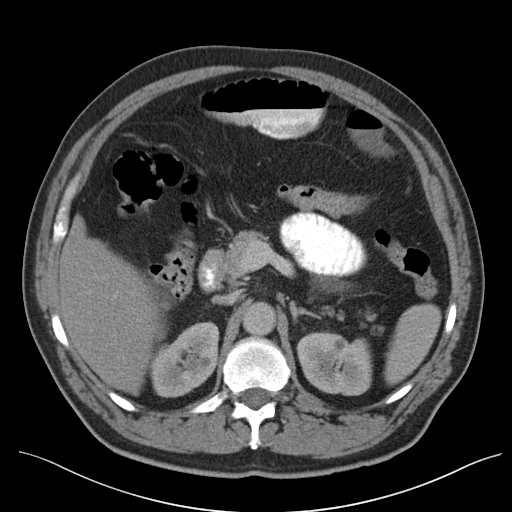
[im 82/104  soft-tissue]
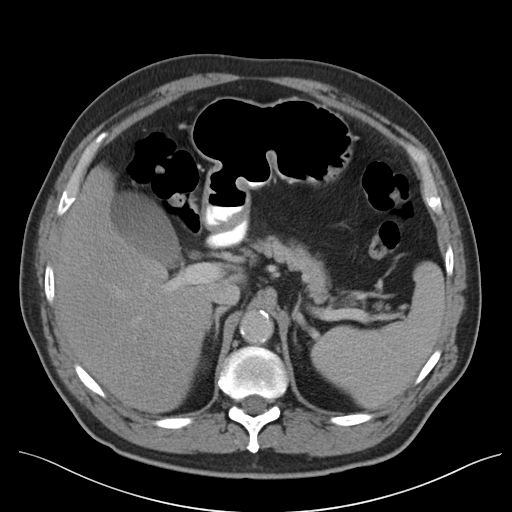
[im 82/104  lung]
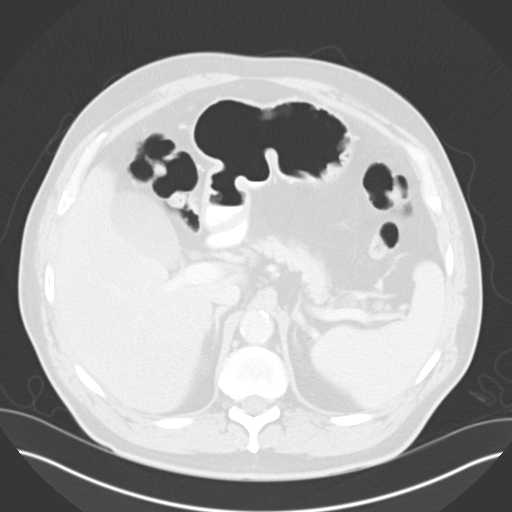
[im 87/104  soft-tissue]
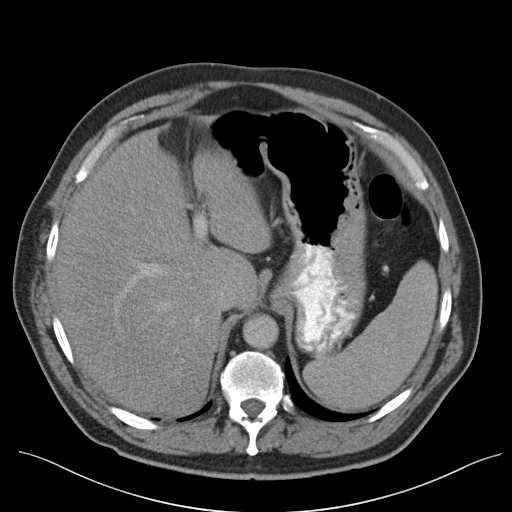
[im 87/104  lung]
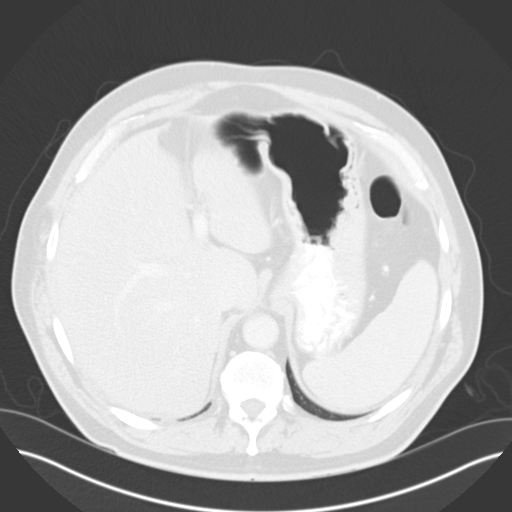
[im 93/104  lung]
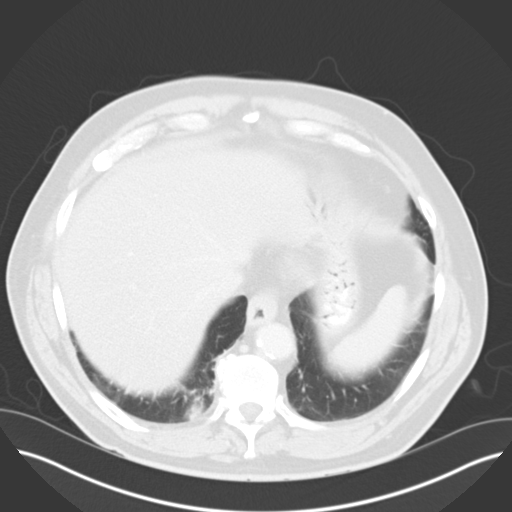
[im 98/104  soft-tissue]
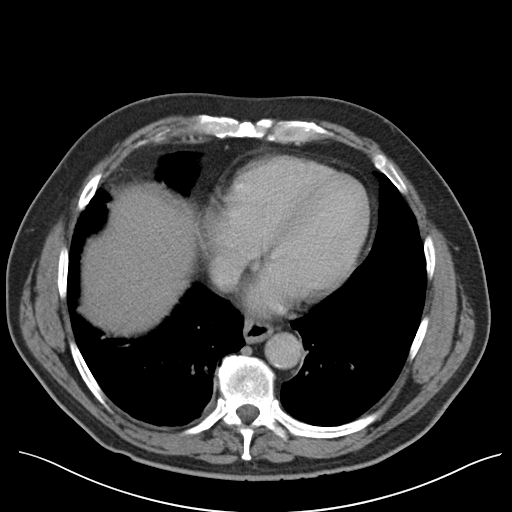
[im 98/104  lung]
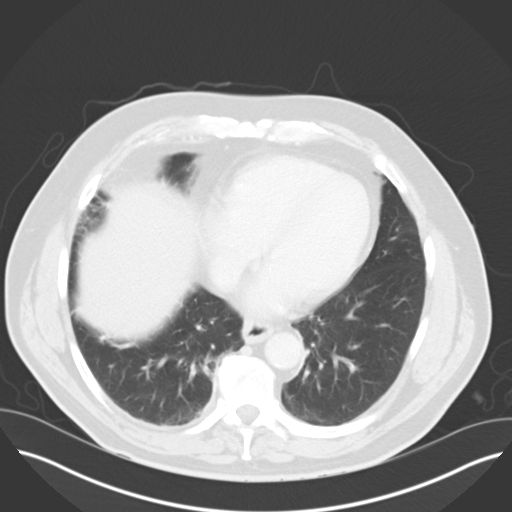

[14 of 32 positions shown; findings below may reference images not displayed]

FINDINGS: Lower Chest: Subsegmental atelectasis at the bases. Mild
cardiomegaly, without pericardial or pleural effusion. Small hiatal
hernia.

Abdomen/Pelvis: Normal liver, spleen, stomach. Descending duodenal
diverticulum.

Normal pancreas, gallbladder, biliary tract, adrenal glands.
Bilateral low-density renal lesions. Primarily too small to
characterize. The larger lesions are consistent with cysts.

No retroperitoneal or retrocrural adenopathy. Aortic and branch
vessel atherosclerosis.

The colon is normal in caliber.  Normal terminal ileum and appendix.

Moderate small bowel dilatation proximally at up to 4.5 cm. Fluid
filled bowel loops continue to the level of a transition in the
distal ileum. This is relatively gradual and identified on images 50
through 63 of series 2. No obstructive mass is seen. There is no
bowel wall thickening. No pneumatosis or free intraperitoneal air.
There is perienteric fluid within the upper pelvis and cul-de-sac.

No pelvic adenopathy. Right-sided hydrocele incompletely imaged.
Normal urinary bladder and prostate.

Bones/Musculoskeletal:  Congenitally short lumbar pedicles.
IMPRESSION: 1. Findings most consistent with small bowel obstruction at the
distal ileum. No definite cause identified. Presumably secondary to
adhesions. If there has not been prior surgery, alternative
explanations such as otherwise occult internal hernia would be
considerations
2. No small ball wall thickening to suggest complicating ischemia.
There is nonspecific perienteric fluid within the upper pelvis and
cul-de-sac.
3. Small hiatal hernia.

## 2015-01-14 ENCOUNTER — Other Ambulatory Visit (HOSPITAL_BASED_OUTPATIENT_CLINIC_OR_DEPARTMENT_OTHER): Payer: Medicare Other

## 2015-01-14 DIAGNOSIS — D472 Monoclonal gammopathy: Secondary | ICD-10-CM

## 2015-01-14 LAB — COMPREHENSIVE METABOLIC PANEL (CC13)
ALBUMIN: 3.6 g/dL (ref 3.5–5.0)
ALK PHOS: 83 U/L (ref 40–150)
ALT: 21 U/L (ref 0–55)
AST: 17 U/L (ref 5–34)
Anion Gap: 8 mEq/L (ref 3–11)
BILIRUBIN TOTAL: 0.49 mg/dL (ref 0.20–1.20)
BUN: 16.8 mg/dL (ref 7.0–26.0)
CO2: 22 mEq/L (ref 22–29)
Calcium: 8.7 mg/dL (ref 8.4–10.4)
Chloride: 109 mEq/L (ref 98–109)
Creatinine: 0.9 mg/dL (ref 0.7–1.3)
EGFR: 89 mL/min/{1.73_m2} — AB (ref >90)
Glucose: 106 mg/dl (ref 70–140)
Potassium: 4.2 mEq/L (ref 3.5–5.1)
SODIUM: 139 meq/L (ref 136–145)
Total Protein: 6.2 g/dL — ABNORMAL LOW (ref 6.4–8.3)

## 2015-01-14 LAB — CBC WITH DIFFERENTIAL/PLATELET
BASO%: 0.8 % (ref 0.0–2.0)
Basophils Absolute: 0.1 10*3/uL (ref 0.0–0.1)
EOS%: 5.4 % (ref 0.0–7.0)
Eosinophils Absolute: 0.3 10*3/uL (ref 0.0–0.5)
HCT: 37.8 % — ABNORMAL LOW (ref 38.4–49.9)
HEMOGLOBIN: 13 g/dL (ref 13.0–17.1)
LYMPH%: 24.2 % (ref 14.0–49.0)
MCH: 31.4 pg (ref 27.2–33.4)
MCHC: 34.4 g/dL (ref 32.0–36.0)
MCV: 91.3 fL (ref 79.3–98.0)
MONO#: 0.6 10*3/uL (ref 0.1–0.9)
MONO%: 9.5 % (ref 0.0–14.0)
NEUT#: 3.8 10*3/uL (ref 1.5–6.5)
NEUT%: 60.1 % (ref 39.0–75.0)
PLATELETS: 155 10*3/uL (ref 140–400)
RBC: 4.14 10*6/uL — ABNORMAL LOW (ref 4.20–5.82)
RDW: 13.2 % (ref 11.0–14.6)
WBC: 6.3 10*3/uL (ref 4.0–10.3)
lymph#: 1.5 10*3/uL (ref 0.9–3.3)

## 2015-01-16 LAB — SPEP & IFE WITH QIG
ABNORMAL PROTEIN BAND3: NOT DETECTED g/dL
ALPHA-1-GLOBULIN: 0.3 g/dL (ref 0.2–0.3)
Abnormal Protein Band1: 0.2 g/dL
Abnormal Protein Band2: 0.3 g/dL
Albumin ELP: 3.7 g/dL — ABNORMAL LOW (ref 3.8–4.8)
Alpha-2-Globulin: 0.6 g/dL (ref 0.5–0.9)
Beta 2: 0.5 g/dL (ref 0.2–0.5)
Beta Globulin: 0.4 g/dL (ref 0.4–0.6)
GAMMA GLOBULIN: 0.7 g/dL — AB (ref 0.8–1.7)
IGA: 356 mg/dL (ref 68–379)
IGM, SERUM: 55 mg/dL (ref 41–251)
IgG (Immunoglobin G), Serum: 630 mg/dL — ABNORMAL LOW (ref 650–1600)
Total Protein, Serum Electrophoresis: 6.2 g/dL (ref 6.1–8.1)

## 2015-01-16 LAB — KAPPA/LAMBDA LIGHT CHAINS
KAPPA FREE LGHT CHN: 2.17 mg/dL — AB (ref 0.33–1.94)
Kappa:Lambda Ratio: 1.53 (ref 0.26–1.65)
LAMBDA FREE LGHT CHN: 1.42 mg/dL (ref 0.57–2.63)

## 2015-01-16 LAB — BETA 2 MICROGLOBULIN, SERUM: BETA 2 MICROGLOBULIN: 1.96 mg/L (ref <=2.51)

## 2015-01-21 ENCOUNTER — Telehealth: Payer: Self-pay | Admitting: Hematology and Oncology

## 2015-01-21 ENCOUNTER — Encounter: Payer: Self-pay | Admitting: Hematology and Oncology

## 2015-01-21 ENCOUNTER — Ambulatory Visit (HOSPITAL_BASED_OUTPATIENT_CLINIC_OR_DEPARTMENT_OTHER): Payer: Medicare Other | Admitting: Hematology and Oncology

## 2015-01-21 VITALS — BP 143/68 | HR 74 | Temp 97.7°F | Resp 18 | Ht 73.0 in | Wt 250.2 lb

## 2015-01-21 DIAGNOSIS — D472 Monoclonal gammopathy: Secondary | ICD-10-CM | POA: Diagnosis not present

## 2015-01-21 DIAGNOSIS — G629 Polyneuropathy, unspecified: Secondary | ICD-10-CM

## 2015-01-21 DIAGNOSIS — G609 Hereditary and idiopathic neuropathy, unspecified: Secondary | ICD-10-CM

## 2015-01-21 NOTE — Progress Notes (Signed)
[facility] OFFICE PROGRESS NOTE  Patient Care Team: [name], MD as PCP - General (Family Medicine)  SUMMARY OF ONCOLOGIC HISTORY:  This is a pleasant patient with severe peripheral neuropathy was referred here for evaluation of abnormal serum protein electrophoresis. Further studies revealed monoclonal gammopathy of unknown significance that is not the cause of his peripheral neuropathy, IgA kappa subtype. The peripheral neuropathy was thought to be related to alcoholism. He is being observed.  INTERVAL HISTORY: Please see below for problem oriented charting. He continues to have peripheral neuropathic pain. Denies recent infection. No new bone pain  REVIEW OF SYSTEMS:   Constitutional: Denies fevers, chills or abnormal weight loss Eyes: Denies blurriness of vision Ears, nose, mouth, throat, and face: Denies mucositis or sore throat Respiratory: Denies cough, dyspnea or wheezes Cardiovascular: Denies palpitation, chest discomfort or lower extremity swelling Gastrointestinal:  Denies nausea, heartburn or change in bowel habits Skin: Denies abnormal skin rashes Lymphatics: Denies new lymphadenopathy or easy bruising Neurological:Denies numbness, tingling or new weaknesses Behavioral/Psych: Mood is stable, no new changes  All other systems were reviewed with the patient and are negative.  I have reviewed the past medical history, past surgical history, social history and family history with the patient and they are unchanged from previous note.  ALLERGIES:  is allergic to cymbalta and penicillins.  MEDICATIONS:  Current Outpatient Prescriptions  Medication Sig Dispense Refill  . cetirizine (ZYRTEC) 10 MG tablet Take 10 mg by mouth daily.    . Cholecalciferol (VITAMIN D) 2000 UNITS CAPS Take 2,000 Units by mouth.    . latanoprost (XALATAN) 0.005 % ophthalmic solution     . lidocaine (XYLOCAINE) 5 % ointment Apply 1 application topically 2 (two) times daily as  needed. 35.44 g 3  . meloxicam (MOBIC) 15 MG tablet     . multivitamin-iron-minerals-folic acid (CENTRUM) chewable tablet Chew 1 tablet by mouth daily.    . Omega-3 Fatty Acids (FISH OIL) 1000 MG CAPS Take 1,000 mg by mouth daily. 3    . pantoprazole (PROTONIX) 40 MG tablet     . pregabalin (LYRICA) 200 MG capsule Take 1 capsule (200 mg total) by mouth 2 (two) times daily. 60 capsule 5  . rOPINIRole (REQUIP) 1 MG tablet     . ketoconazole (NIZORAL) 2 % cream      No current facility-administered medications for this visit.    PHYSICAL EXAMINATION: ECOG PERFORMANCE STATUS: 1 - Symptomatic but completely ambulatory  Filed Vitals:   01/21/15 1010  BP: 143/68  Pulse: 74  Temp: 97.7 F (36.5 C)  Resp: 18   Filed Weights   01/21/15 1010  Weight: 250 lb 3.2 oz (113.49 kg)    GENERAL:alert, no distress and comfortable SKIN: skin color, texture, turgor are normal, no rashes or significant lesions EYES: normal, Conjunctiva are pink and non-injected, sclera clear OROPHARYNX:no exudate, no erythema and lips, buccal mucosa, and tongue normal  NECK: supple, thyroid normal size, non-tender, without nodularity LYMPH:  no palpable lymphadenopathy in the cervical, axillary or inguinal LUNGS: clear to auscultation and percussion with normal breathing effort HEART: regular rate & rhythm and no murmurs and no lower extremity edema ABDOMEN:abdomen soft, non-tender and normal bowel sounds Musculoskeletal:no cyanosis of digits and no clubbing  NEURO: alert & oriented x 3 with fluent speech, no focal motor/sensory deficits  LABORATORY DATA:  I have reviewed the data as listed    Component Value Date/Time   NA 139 01/14/2015 1014   NA 138 04/24/2013 0509  K 4.2 01/14/2015 1014   K 3.9 04/24/2013 0509   CL 105 04/24/2013 0509   CO2 22 01/14/2015 1014   CO2 24 04/24/2013 0509   GLUCOSE 106 01/14/2015 1014   GLUCOSE 106* 04/24/2013 0509   BUN 16.8 01/14/2015 1014   BUN 9 04/24/2013 0509    CREATININE 0.9 01/14/2015 1014   CREATININE 0.89 04/24/2013 0509   CALCIUM 8.7 01/14/2015 1014   CALCIUM 9.1 04/24/2013 0509   PROT 6.2* 01/14/2015 1014   PROT 5.9* 04/16/2013 0426   PROT 6.9 04/06/2013 1114   ALBUMIN 3.6 01/14/2015 1014   ALBUMIN 3.0* 04/16/2013 0426   AST 17 01/14/2015 1014   AST 13 04/16/2013 0426   ALT 21 01/14/2015 1014   ALT 21 04/16/2013 0426   ALKPHOS 83 01/14/2015 1014   ALKPHOS 54 04/16/2013 0426   BILITOT 0.49 01/14/2015 1014   BILITOT 0.5 04/16/2013 0426   GFRNONAA 87* 04/24/2013 0509   GFRAA >90 04/24/2013 0509    No results found for: SPEP, UPEP  Lab Results  Component Value Date   WBC 6.3 01/14/2015   NEUTROABS 3.8 01/14/2015   HGB 13.0 01/14/2015   HCT 37.8* 01/14/2015   MCV 91.3 01/14/2015   PLT 155 01/14/2015      Chemistry      Component Value Date/Time   NA 139 01/14/2015 1014   NA 138 04/24/2013 0509   K 4.2 01/14/2015 1014   K 3.9 04/24/2013 0509   CL 105 04/24/2013 0509   CO2 22 01/14/2015 1014   CO2 24 04/24/2013 0509   BUN 16.8 01/14/2015 1014   BUN 9 04/24/2013 0509   CREATININE 0.9 01/14/2015 1014   CREATININE 0.89 04/24/2013 0509      Component Value Date/Time   CALCIUM 8.7 01/14/2015 1014   CALCIUM 9.1 04/24/2013 0509   ALKPHOS 83 01/14/2015 1014   ALKPHOS 54 04/16/2013 0426   AST 17 01/14/2015 1014   AST 13 04/16/2013 0426   ALT 21 01/14/2015 1014   ALT 21 04/16/2013 0426   BILITOT 0.49 01/14/2015 1014   BILITOT 0.5 04/16/2013 0426      ASSESSMENT & PLAN:  MGUS (monoclonal gammopathy of unknown significance) Clinically, he has no presence of disease progression. I plan to see him on a yearly basis with history, physical examination and blood work. I recommend influenza vaccination.    Neuropathy I suspect this is related to chronic alcohol intake. I recommended patient to discontinue alcohol intake but the patient does not believe that alcohol can cause worsening peripheral neuropathy He will  continue taking Lyrica for this.     Orders Placed This Encounter  Procedures  . Comprehensive metabolic panel    Standing Status: Future     Number of Occurrences:      Standing Expiration Date: 02/25/2016  . CBC with Differential/Platelet    Standing Status: Future     Number of Occurrences:      Standing Expiration Date: 02/25/2016  . SPEP & IFE with QIG    Standing Status: Future     Number of Occurrences:      Standing Expiration Date: 02/25/2016  . Kappa/lambda light chains    Standing Status: Future     Number of Occurrences:      Standing Expiration Date: 02/25/2016   All questions were answered. The patient knows to call the clinic with any problems, questions or concerns. No barriers to learning was detected. I spent 15 minutes counseling the patient face to face.  The total time spent in the appointment was 20 minutes and more than 50% was on counseling and review of test results     [facility], [place], MD 01/21/2015 11:19 AM

## 2015-01-21 NOTE — Assessment & Plan Note (Signed)
I suspect this is related to chronic alcohol intake. I recommended patient to discontinue alcohol intake but the patient does not believe that alcohol can cause worsening peripheral neuropathy He will continue taking Lyrica for this  

## 2015-01-21 NOTE — Assessment & Plan Note (Signed)
Clinically, he has no presence of disease progression. I plan to see him on a yearly basis with history, physical examination and blood work. I recommend influenza vaccination.

## 2015-01-21 NOTE — Telephone Encounter (Signed)
Gave adn printed appt sched and avs fo rpt for Aug 2017 °

## 2015-01-22 ENCOUNTER — Ambulatory Visit (INDEPENDENT_AMBULATORY_CARE_PROVIDER_SITE_OTHER): Payer: Medicare Other | Admitting: Neurology

## 2015-01-22 ENCOUNTER — Encounter: Payer: Self-pay | Admitting: Neurology

## 2015-01-22 VITALS — BP 145/74 | HR 61 | Ht 73.0 in | Wt 252.0 lb

## 2015-01-22 DIAGNOSIS — D472 Monoclonal gammopathy: Secondary | ICD-10-CM

## 2015-01-22 DIAGNOSIS — G629 Polyneuropathy, unspecified: Secondary | ICD-10-CM | POA: Diagnosis not present

## 2015-01-22 NOTE — Patient Instructions (Signed)

## 2015-01-22 NOTE — Progress Notes (Signed)
Reason for visit: Peripheral neuropathy  Daniel Whitaker is an 69 y.o. male  History of present illness:  Daniel Whitaker is a 69 year old right-handed white male with a history of a peripheral neuropathy. He has to have a MGUS with an IgA antibody. The patient is followed by oncology for this. The patient continues to work, he climbs on a stepladder, but he is remaining safe. He has not had any falls. Occasionally, he may have some dizziness if he bends or stoops over. He has some discomfort in the left greater than right foot associated with the MP joints of the great toes. His feet hurt most significantly when he first gets out of bed and tries to ambulate. Once he is up on his feet for a few moments, this seems to help the discomfort. The patient is on Lyrica taking 200 mg twice daily, he finds that this is adequate to help some of the discomfort and help him sleep at night. He denies any other significant medical issues that have come up since last seen.  Past Medical History  Diagnosis Date  . Nontoxic uninodular goiter   . Peripheral neuropathy   . Esophageal reflux   . Erectile dysfunction   . Obesity   . Glaucoma   . Impaired fasting glucose   . Hypertriglyceridemia   . MGUS (monoclonal gammopathy of unknown significance)   . Neuropathy associated with MGUS 07/11/2013  . Unspecified deficiency anemia 07/11/2013    Past Surgical History  Procedure Laterality Date  . Knee surgery    . Colon resection N/A 04/21/2013    Procedure: DIAGNOSTIC LAPAROSCOPY, lysis of adhesions for partial small bowel obstruction;  Surgeon: Odis Hollingshead, MD;  Location: [facility];  Service: General;  Laterality: N/A;  . Small intestine surgery      Family History  Problem Relation Age of Onset  . Brain cancer Mother   . Lung cancer Father   . Heart disease Brother   . Dementia Brother   . Neuropathy Neg Hx     Social history:  reports that he quit smoking about 30 years ago. He has never used  smokeless tobacco. He reports that he drinks about 3.6 - 4.8 oz of alcohol per week. He reports that he does not use illicit drugs.    Allergies  Allergen Reactions  . Cymbalta [Duloxetine Hcl]     Drowsiness, nausea  . Penicillins     Medications:  Prior to Admission medications   Medication Sig Start Date End Date Taking? Authorizing Provider  cetirizine (ZYRTEC) 10 MG tablet Take 10 mg by mouth daily.   Yes Historical Provider, MD  Cholecalciferol (VITAMIN D) 2000 UNITS CAPS Take 2,000 Units by mouth.   Yes Historical Provider, MD  ketoconazole (NIZORAL) 2 % cream  11/28/14  Yes Historical Provider, MD  latanoprost (XALATAN) 0.005 % ophthalmic solution  03/01/13  Yes Historical Provider, MD  lidocaine (XYLOCAINE) 5 % ointment Apply 1 application topically 2 (two) times daily as needed. 07/17/13  Yes Drema Dallas, DO  meloxicam (MOBIC) 15 MG tablet  04/03/13  Yes Historical Provider, MD  multivitamin-iron-minerals-folic acid (CENTRUM) chewable tablet Chew 1 tablet by mouth daily.   Yes Historical Provider, MD  Omega-3 Fatty Acids (FISH OIL) 1000 MG CAPS Take 1,000 mg by mouth daily. 3   Yes Historical Provider, MD  pantoprazole (PROTONIX) 40 MG tablet  03/14/13  Yes Historical Provider, MD  pregabalin (LYRICA) 200 MG capsule Take 1 capsule (200 mg  total) by mouth 2 (two) times daily. 11/15/14  Yes Kathrynn Ducking, MD  rOPINIRole (REQUIP) 1 MG tablet  11/28/14  Yes Historical Provider, MD    ROS:  Out of a complete 14 system review of symptoms, the patient complains only of the following symptoms, and all other reviewed systems are negative.  Restless legs Urgency of the bladder Numbness  Blood pressure 145/74, pulse 61, height 6\' 1"  (1.854 m), weight 252 lb (114.306 kg).  Physical Exam  General: The patient is alert and cooperative at the time of the examination.  Skin: No significant peripheral edema is noted.   Neurologic Exam  Mental status: The patient is alert and  oriented x 3 at the time of the examination. The patient has apparent normal recent and remote memory, with an apparently normal attention span and concentration ability.   Cranial nerves: Facial symmetry is present. Speech is normal, no aphasia or dysarthria is noted. Extraocular movements are full. Visual fields are full.  Motor: The patient has good strength in all 4 extremities.  Sensory examination: Soft touch sensation is symmetric on the face, arms, and legs.  Coordination: The patient has good finger-nose-finger and heel-to-shin bilaterally.  Gait and station: The patient has a normal gait. Tandem gait is slightly unsteady. Romberg is negative. No drift is seen.  Reflexes: Deep tendon reflexes are symmetric, but are depressed.   Assessment/Plan:  1. Peripheral neuropathy  2. MGUS, IgA antibody  The patient will continue the Lyrica for now. He has noted very little progression over time. The last EMG and nerve conduction study was done 2014. This may need to be followed over time. There is some question in regards to his alcohol intake in regards to the relationship to the peripheral neuropathy. The patient has been asked to cut back on his alcohol intake. The patient will follow-up in 6 or 7 months. The patient does report some mild balance issues, particularly notable when playing golf.  Jill Alexanders MD 01/22/2015 7:41 PM  [facility] [address] [place], [place] [address]  Phone [phone] Fax [phone]

## 2015-05-22 ENCOUNTER — Other Ambulatory Visit: Payer: Self-pay | Admitting: Neurology

## 2015-06-07 ENCOUNTER — Encounter: Payer: Self-pay | Admitting: Internal Medicine

## 2015-07-25 ENCOUNTER — Encounter: Payer: Self-pay | Admitting: Adult Health

## 2015-07-25 ENCOUNTER — Ambulatory Visit (INDEPENDENT_AMBULATORY_CARE_PROVIDER_SITE_OTHER): Payer: Medicare Other | Admitting: Adult Health

## 2015-07-25 VITALS — BP 131/82 | HR 62 | Ht 73.0 in | Wt 245.5 lb

## 2015-07-25 DIAGNOSIS — G609 Hereditary and idiopathic neuropathy, unspecified: Secondary | ICD-10-CM | POA: Diagnosis not present

## 2015-07-25 NOTE — Progress Notes (Signed)
I have read the note, and I agree with the clinical assessment and plan.  WILLIS,CHARLES KEITH   

## 2015-07-25 NOTE — Progress Notes (Signed)
PATIENT: [name] DOB: [birth year]  REASON FOR VISIT: follow up- peripheral neuropathy HISTORY FROM: patient  HISTORY OF PRESENT ILLNESS: [name] is a 70 year old male with a history of a peripheral neuropathy. He returns today for follow-up. The patient is currently taking Lyrica 200 mg twice a day. He does feel that the Lyrica helps with his discomfort. He states that he does feel that the left foot is becoming more numb. He denies any significant changes with his gait or balance. He states occasionally he will stumble but no falls. The patient states that if he plays golf his feet will hurt for several days afterwards. The patient describes his discomfort as a soreness or achy feeling in the feet. Occasionally he'll have the sharp shooting pains in the toes. He does have lidocaine cream but has not used that often. He is unsure of the benefit. The patient states that he has cut back on his consumption about. He states that he normally drinks on Mondays because he plays poker and Fridays when he plays golf. He denies any new symptoms. He returns today for an evaluation.  HISTORY 01/22/15: [name] is a 70 year old right-handed white male with a history of a peripheral neuropathy. He has to have a MGUS with an IgA antibody. The patient is followed by oncology for this. The patient continues to work, he climbs on a stepladder, but he is remaining safe. He has not had any falls. Occasionally, he may have some dizziness if he bends or stoops over. He has some discomfort in the left greater than right foot associated with the MP joints of the great toes. His feet hurt most significantly when he first gets out of bed and tries to ambulate. Once he is up on his feet for a few moments, this seems to help the discomfort. The patient is on Lyrica taking 200 mg twice daily, he finds that this is adequate to help some of the discomfort and help him sleep at night. He denies any other significant  medical issues that have come up since last seen  REVIEW OF SYSTEMS: Out of a complete 14 system review of symptoms, the patient complains only of the following symptoms, and all other reviewed systems are negative.  Numbness  ALLERGIES: Allergies  Allergen Reactions  . Cymbalta [Duloxetine Hcl]     Drowsiness, nausea  . Penicillins     HOME MEDICATIONS: Outpatient Prescriptions Prior to Visit  Medication Sig Dispense Refill  . cetirizine (ZYRTEC) 10 MG tablet Take 10 mg by mouth daily.    . Cholecalciferol (VITAMIN D) 2000 UNITS CAPS Take 2,000 Units by mouth.    Marland Kitchen ketoconazole (NIZORAL) 2 % cream     . latanoprost (XALATAN) 0.005 % ophthalmic solution     . lidocaine (XYLOCAINE) 5 % ointment Apply 1 application topically 2 (two) times daily as needed. 35.44 g 3  . LYRICA 200 MG capsule TAKE ONE CAPSULE BY MOUTH TWICE A DAY 60 capsule 2  . meloxicam (MOBIC) 15 MG tablet     . multivitamin-iron-minerals-folic acid (CENTRUM) chewable tablet Chew 1 tablet by mouth daily.    . Omega-3 Fatty Acids (FISH OIL) 1000 MG CAPS Take 1,000 mg by mouth daily. 3    . pantoprazole (PROTONIX) 40 MG tablet     . rOPINIRole (REQUIP) 1 MG tablet      No facility-administered medications prior to visit.    PAST MEDICAL HISTORY: Past Medical History  Diagnosis Date  . Nontoxic  uninodular goiter   . Peripheral neuropathy ([place])   . Esophageal reflux   . Erectile dysfunction   . Obesity   . Glaucoma   . Impaired fasting glucose   . Hypertriglyceridemia   . MGUS (monoclonal gammopathy of unknown significance)   . Neuropathy associated with MGUS ([place]) 07/11/2013  . Unspecified deficiency anemia 07/11/2013    PAST SURGICAL HISTORY: Past Surgical History  Procedure Laterality Date  . Knee surgery    . Colon resection N/A 04/21/2013    Procedure: DIAGNOSTIC LAPAROSCOPY, lysis of adhesions for partial small bowel obstruction;  Surgeon: Odis Hollingshead, MD;  Location: [facility];  Service: General;   Laterality: N/A;  . Small intestine surgery      FAMILY HISTORY: Family History  Problem Relation Age of Onset  . Brain cancer Mother   . Lung cancer Father   . Heart disease Brother   . Dementia Brother   . Neuropathy Neg Hx     SOCIAL HISTORY: Social History   Social History  . Marital Status: Married    Spouse Name: [name]  . Number of Children: 2  . Years of Education: GED   Occupational History  . Not on file.   Social History Main Topics  . Smoking status: Former Smoker    Quit date: 06/01/1984  . Smokeless tobacco: Never Used  . Alcohol Use: 3.6 - 4.8 oz/week    6-8 Cans of beer per week     Comment: 12 BEERS WEEKLY  . Drug Use: No  . Sexual Activity: Not on file   Other Topics Concern  . Not on file   Social History Narrative   Patient lives at home with his wife [name])   Patient works full-time.   Patient has two children.   Patient is right-handed.   Patient has his GED.   Patient does not drink caffeine.      PHYSICAL EXAM  Filed Vitals:   07/25/15 0959  BP: 131/82  Pulse: 62  Height: 6\' 1"  (1.854 m)  Weight: 245 lb 8 oz (111.358 kg)   Body mass index is 32.4 kg/(m^2).  Generalized: Well developed, in no acute distress   Neurological examination  Mentation: Alert oriented to time, place, history taking. Follows all commands speech and language fluent Cranial nerve II-XII: Pupils were equal round reactive to light. Extraocular movements were full, visual field were full on confrontational test. Facial sensation and strength were normal. Uvula tongue midline. Head turning and shoulder shrug  were normal and symmetric. Motor: The motor testing reveals 5 over 5 strength of all 4 extremities. Good symmetric motor tone is noted throughout.  Sensory: Sensory testing is intact to soft touch on all 4 extremities. No evidence of extinction is noted. Decreased pinprick sensation in the lower extremities in a stocking-like pattern-left greater than  right. Vibration sensation decreased in the left lower extremity intact in the right lower extremity. The patient has no wounds or sores on the feet. Coordination: Cerebellar testing reveals good finger-nose-finger and heel-to-shin bilaterally.  Gait and station: Gait is normal. Tandem gait is normal. Romberg is negative. No drift is seen.  Reflexes: Deep tendon reflexes are symmetric and normal bilaterally.   DIAGNOSTIC DATA (LABS, IMAGING, TESTING) - I reviewed patient records, labs, notes, testing and imaging myself where available.  Lab Results  Component Value Date   WBC 6.3 01/14/2015   HGB 13.0 01/14/2015   HCT 37.8* 01/14/2015   MCV 91.3 01/14/2015   PLT 155 01/14/2015  Component Value Date/Time   NA 139 01/14/2015 1014   NA 138 04/24/2013 0509   K 4.2 01/14/2015 1014   K 3.9 04/24/2013 0509   CL 105 04/24/2013 0509   CO2 22 01/14/2015 1014   CO2 24 04/24/2013 0509   GLUCOSE 106 01/14/2015 1014   GLUCOSE 106* 04/24/2013 0509   BUN 16.8 01/14/2015 1014   BUN 9 04/24/2013 0509   CREATININE 0.9 01/14/2015 1014   CREATININE 0.89 04/24/2013 0509   CALCIUM 8.7 01/14/2015 1014   CALCIUM 9.1 04/24/2013 0509   PROT 6.2* 01/14/2015 1014   PROT 5.9* 04/16/2013 0426   PROT 6.9 04/06/2013 1114   ALBUMIN 3.6 01/14/2015 1014   ALBUMIN 3.0* 04/16/2013 0426   AST 17 01/14/2015 1014   AST 13 04/16/2013 0426   ALT 21 01/14/2015 1014   ALT 21 04/16/2013 0426   ALKPHOS 83 01/14/2015 1014   ALKPHOS 54 04/16/2013 0426   BILITOT 0.49 01/14/2015 1014   BILITOT 0.5 04/16/2013 0426   GFRNONAA 87* 04/24/2013 0509   GFRAA >90 04/24/2013 0509   ASSESSMENT AND PLAN 70 y.o. year old male  has a past medical history of Nontoxic uninodular goiter; Peripheral neuropathy ([place]); Esophageal reflux; Erectile dysfunction; Obesity; Glaucoma; Impaired fasting glucose; Hypertriglyceridemia; MGUS (monoclonal gammopathy of unknown significance); Neuropathy associated with MGUS ([place]) (07/11/2013); and  Unspecified deficiency anemia (07/11/2013). here with:  1. Peripheral neuropathy  The patient will continue on Lyrica 200 mg twice a day. I have encouraged the patient uses the lidocaine cream for her discomfort. In the future we could try a compounded cream if necessary. The patient should continue to limit his consumption of alcohol. Patient advised that if his symptoms worsen or he develops any new symptoms he should let us know. He will follow-up in 6 months or sooner if needed.     Ward Givens, MSN, NP-C 07/25/2015, 10:33 AM [facility] [address] [place], [place] [address] [phone]

## 2015-07-25 NOTE — Patient Instructions (Signed)
Continue Lyrica 200 mg twice a day Use lidocaine cream on feet as needed for discomfort Can consider compounded cream in the future

## 2015-08-01 ENCOUNTER — Other Ambulatory Visit: Payer: Self-pay | Admitting: Family Medicine

## 2015-08-01 ENCOUNTER — Ambulatory Visit
Admission: RE | Admit: 2015-08-01 | Discharge: 2015-08-01 | Disposition: A | Discharge status: Home / Self Care | Payer: Medicare Other | Source: Ambulatory Visit | Attending: Family Medicine | Admitting: Family Medicine

## 2015-08-01 DIAGNOSIS — K5909 Other constipation: Secondary | ICD-10-CM

## 2015-08-01 DIAGNOSIS — R229 Localized swelling, mass and lump, unspecified: Principal | ICD-10-CM

## 2015-08-01 DIAGNOSIS — R059 Cough, unspecified: Secondary | ICD-10-CM

## 2015-08-01 DIAGNOSIS — IMO0002 Reserved for concepts with insufficient information to code with codable children: Secondary | ICD-10-CM

## 2015-08-01 DIAGNOSIS — R05 Cough: Secondary | ICD-10-CM

## 2015-08-05 ENCOUNTER — Other Ambulatory Visit: Payer: Medicare Other

## 2015-08-06 ENCOUNTER — Other Ambulatory Visit: Payer: Self-pay | Admitting: Family Medicine

## 2015-08-06 ENCOUNTER — Ambulatory Visit
Admission: RE | Admit: 2015-08-06 | Discharge: 2015-08-06 | Disposition: A | Discharge status: Home / Self Care | Payer: Medicare Other | Source: Ambulatory Visit | Attending: Family Medicine | Admitting: Family Medicine

## 2015-08-06 DIAGNOSIS — R1084 Generalized abdominal pain: Secondary | ICD-10-CM

## 2015-08-06 MED ORDER — IOPAMIDOL (ISOVUE-300) INJECTION 61%
100.0000 mL | Freq: Once | INTRAVENOUS | Status: AC | PRN
  Administered 2015-08-06: 100 mL via INTRAVENOUS

## 2015-08-12 ENCOUNTER — Other Ambulatory Visit: Payer: Medicare Other

## 2015-08-12 ENCOUNTER — Ambulatory Visit
Admission: RE | Admit: 2015-08-12 | Discharge: 2015-08-12 | Disposition: A | Discharge status: Home / Self Care | Payer: Medicare Other | Source: Ambulatory Visit | Attending: Family Medicine | Admitting: Family Medicine

## 2015-08-12 DIAGNOSIS — IMO0002 Reserved for concepts with insufficient information to code with codable children: Secondary | ICD-10-CM

## 2015-08-12 DIAGNOSIS — R229 Localized swelling, mass and lump, unspecified: Principal | ICD-10-CM

## 2015-08-26 ENCOUNTER — Other Ambulatory Visit: Payer: Self-pay | Admitting: Neurology

## 2015-08-28 ENCOUNTER — Encounter: Payer: Self-pay | Admitting: *Deleted

## 2015-12-04 ENCOUNTER — Other Ambulatory Visit: Payer: Self-pay | Admitting: Family Medicine

## 2015-12-04 DIAGNOSIS — E041 Nontoxic single thyroid nodule: Secondary | ICD-10-CM

## 2015-12-10 ENCOUNTER — Ambulatory Visit
Admission: RE | Admit: 2015-12-10 | Discharge: 2015-12-10 | Disposition: A | Discharge status: Home / Self Care | Payer: Medicare Other | Source: Ambulatory Visit | Attending: Family Medicine | Admitting: Family Medicine

## 2015-12-10 DIAGNOSIS — E041 Nontoxic single thyroid nodule: Secondary | ICD-10-CM

## 2016-01-14 ENCOUNTER — Other Ambulatory Visit (HOSPITAL_BASED_OUTPATIENT_CLINIC_OR_DEPARTMENT_OTHER): Payer: Medicare Other

## 2016-01-14 DIAGNOSIS — G609 Hereditary and idiopathic neuropathy, unspecified: Secondary | ICD-10-CM

## 2016-01-14 DIAGNOSIS — D472 Monoclonal gammopathy: Secondary | ICD-10-CM | POA: Diagnosis not present

## 2016-01-14 LAB — CBC WITH DIFFERENTIAL/PLATELET
BASO%: 2 % (ref 0.0–2.0)
Basophils Absolute: 0.1 10*3/uL (ref 0.0–0.1)
EOS%: 4.6 % (ref 0.0–7.0)
Eosinophils Absolute: 0.3 10*3/uL (ref 0.0–0.5)
HEMATOCRIT: 41 % (ref 38.4–49.9)
HGB: 13.9 g/dL (ref 13.0–17.1)
LYMPH#: 1.6 10*3/uL (ref 0.9–3.3)
LYMPH%: 27.8 % (ref 14.0–49.0)
MCH: 31.4 pg (ref 27.2–33.4)
MCHC: 33.8 g/dL (ref 32.0–36.0)
MCV: 92.8 fL (ref 79.3–98.0)
MONO#: 0.4 10*3/uL (ref 0.1–0.9)
MONO%: 7.4 % (ref 0.0–14.0)
NEUT%: 58.2 % (ref 39.0–75.0)
NEUTROS ABS: 3.3 10*3/uL (ref 1.5–6.5)
PLATELETS: 167 10*3/uL (ref 140–400)
RBC: 4.42 10*6/uL (ref 4.20–5.82)
RDW: 13.2 % (ref 11.0–14.6)
WBC: 5.6 10*3/uL (ref 4.0–10.3)

## 2016-01-14 LAB — COMPREHENSIVE METABOLIC PANEL
ALT: 14 U/L (ref 0–55)
AST: 17 U/L (ref 5–34)
Albumin: 3.7 g/dL (ref 3.5–5.0)
Alkaline Phosphatase: 79 U/L (ref 40–150)
Anion Gap: 9 mEq/L (ref 3–11)
BILIRUBIN TOTAL: 0.66 mg/dL (ref 0.20–1.20)
BUN: 22 mg/dL (ref 7.0–26.0)
CALCIUM: 9.1 mg/dL (ref 8.4–10.4)
CHLORIDE: 107 meq/L (ref 98–109)
CO2: 23 meq/L (ref 22–29)
CREATININE: 0.9 mg/dL (ref 0.7–1.3)
EGFR: 88 mL/min/{1.73_m2} — ABNORMAL LOW (ref >90)
Glucose: 116 mg/dl (ref 70–140)
Potassium: 4.3 mEq/L (ref 3.5–5.1)
Sodium: 139 mEq/L (ref 136–145)
TOTAL PROTEIN: 6.6 g/dL (ref 6.4–8.3)

## 2016-01-15 LAB — KAPPA/LAMBDA LIGHT CHAINS
IG LAMBDA FREE LIGHT CHAIN: 9.7 mg/L (ref 5.7–26.3)
Ig Kappa Free Light Chain: 16.8 mg/L (ref 3.3–19.4)
KAPPA/LAMBDA FLC RATIO: 1.73 — AB (ref 0.26–1.65)

## 2016-01-16 LAB — MULTIPLE MYELOMA PANEL, SERUM
ALBUMIN SERPL ELPH-MCNC: 4 g/dL (ref 2.9–4.4)
ALBUMIN/GLOB SERPL: 1.7 (ref 0.7–1.7)
ALPHA 1: 0.1 g/dL (ref 0.0–0.4)
Alpha2 Glob SerPl Elph-Mcnc: 0.5 g/dL (ref 0.4–1.0)
B-Globulin SerPl Elph-Mcnc: 0.8 g/dL (ref 0.7–1.3)
GAMMA GLOB SERPL ELPH-MCNC: 1 g/dL (ref 0.4–1.8)
Globulin, Total: 2.5 g/dL (ref 2.2–3.9)
IGA/IMMUNOGLOBULIN A, SERUM: 374 mg/dL (ref 61–437)
IGM (IMMUNOGLOBIN M), SRM: 65 mg/dL (ref 20–172)
IgG, Qn, Serum: 691 mg/dL — ABNORMAL LOW (ref 700–1600)
M Protein SerPl Elph-Mcnc: 0.2 g/dL — ABNORMAL HIGH
Total Protein: 6.5 g/dL (ref 6.0–8.5)

## 2016-01-21 ENCOUNTER — Ambulatory Visit (HOSPITAL_BASED_OUTPATIENT_CLINIC_OR_DEPARTMENT_OTHER): Payer: Medicare Other | Admitting: Hematology and Oncology

## 2016-01-21 ENCOUNTER — Encounter: Payer: Self-pay | Admitting: Hematology and Oncology

## 2016-01-21 DIAGNOSIS — G629 Polyneuropathy, unspecified: Secondary | ICD-10-CM | POA: Diagnosis not present

## 2016-01-21 DIAGNOSIS — D472 Monoclonal gammopathy: Secondary | ICD-10-CM

## 2016-01-21 NOTE — Assessment & Plan Note (Signed)
Clinically, he has no presence of disease progression. I plan to see him on a yearly basis with history, physical examination and blood work. I recommend influenza vaccination in the fall.

## 2016-01-21 NOTE — Patient Instructions (Addendum)
err

## 2016-01-21 NOTE — Progress Notes (Signed)
[place] OFFICE PROGRESS NOTE  Patient Care Team: [name], MD as PCP - General (Family Medicine)  SUMMARY OF ONCOLOGIC HISTORY:  This is a pleasant patient with severe peripheral neuropathy was referred here for evaluation of abnormal serum protein electrophoresis. Further studies revealed monoclonal gammopathy of unknown significance that is not the cause of his peripheral neuropathy, IgA kappa subtype. The peripheral neuropathy was thought to be related to alcoholism. He is being observed.  INTERVAL HISTORY: Please see below for problem oriented charting. He denies worsening neuropathy. Denies recent infection. No new bone pain.  REVIEW OF SYSTEMS:   Constitutional: Denies fevers, chills or abnormal weight loss Eyes: Denies blurriness of vision Ears, nose, mouth, throat, and face: Denies mucositis or sore throat Respiratory: Denies cough, dyspnea or wheezes Cardiovascular: Denies palpitation, chest discomfort or lower extremity swelling Gastrointestinal:  Denies nausea, heartburn or change in bowel habits Skin: Denies abnormal skin rashes Lymphatics: Denies new lymphadenopathy or easy bruising Neurological:Denies numbness, tingling or new weaknesses Behavioral/Psych: Mood is stable, no new changes  All other systems were reviewed with the patient and are negative.  I have reviewed the past medical history, past surgical history, social history and family history with the patient and they are unchanged from previous note.  ALLERGIES:  is allergic to cymbalta [duloxetine hcl] and penicillins.  MEDICATIONS:  Current Outpatient Prescriptions  Medication Sig Dispense Refill  . cetirizine (ZYRTEC) 10 MG tablet Take 10 mg by mouth daily.    . Cholecalciferol (VITAMIN D) 2000 UNITS CAPS Take 2,000 Units by mouth.    . Cyanocobalamin (VITAMIN B-12 PO) Take 1 tablet by mouth daily.    Marland Kitchen ketoconazole (NIZORAL) 2 % cream     . latanoprost (XALATAN) 0.005 %  ophthalmic solution     . lidocaine (XYLOCAINE) 5 % ointment Apply 1 application topically 2 (two) times daily as needed. 35.44 g 3  . LYRICA 200 MG capsule TAKE ONE CAPSULE BY MOUTH TWICE A DAY 60 capsule 11  . meloxicam (MOBIC) 15 MG tablet     . multivitamin-iron-minerals-folic acid (CENTRUM) chewable tablet Chew 1 tablet by mouth daily.    . Omega-3 Fatty Acids (FISH OIL) 1000 MG CAPS Take 1,000 mg by mouth daily. 3    . pantoprazole (PROTONIX) 40 MG tablet     . Pseudoephedrine HCl (SUDAFED 24 HOUR PO) Take by mouth.    Marland Kitchen rOPINIRole (REQUIP) 1 MG tablet      No current facility-administered medications for this visit.     PHYSICAL EXAMINATION: ECOG PERFORMANCE STATUS: 0 - Asymptomatic  Vitals:   01/21/16 1008  BP: (!) 145/71  Pulse: 73  Resp: 18  Temp: 97.8 F (36.6 C)   Filed Weights   01/21/16 1008  Weight: 233 lb 6.4 oz (105.9 kg)    GENERAL:alert, no distress and comfortable SKIN: skin color, texture, turgor are normal, no rashes or significant lesions EYES: normal, Conjunctiva are pink and non-injected, sclera clear OROPHARYNX:no exudate, no erythema and lips, buccal mucosa, and tongue normal  NECK: supple, thyroid normal size, non-tender, without nodularity LYMPH:  no palpable lymphadenopathy in the cervical, axillary or inguinal LUNGS: clear to auscultation and percussion with normal breathing effort HEART: regular rate & rhythm and no murmurs and no lower extremity edema ABDOMEN:abdomen soft, non-tender and normal bowel sounds Musculoskeletal:no cyanosis of digits and no clubbing  NEURO: alert & oriented x 3 with fluent speech, no focal motor/sensory deficits  LABORATORY DATA:  I have reviewed the data as  listed    Component Value Date/Time   NA 139 01/14/2016 0949   K 4.3 01/14/2016 0949   CL 105 04/24/2013 0509   CO2 23 01/14/2016 0949   GLUCOSE 116 01/14/2016 0949   BUN 22.0 01/14/2016 0949   CREATININE 0.9 01/14/2016 0949   CALCIUM 9.1 01/14/2016  0949   PROT 6.5 01/14/2016 0950   PROT 6.6 01/14/2016 0949   ALBUMIN 3.7 01/14/2016 0949   AST 17 01/14/2016 0949   ALT 14 01/14/2016 0949   ALKPHOS 79 01/14/2016 0949   BILITOT 0.66 01/14/2016 0949   GFRNONAA 87 (L) 04/24/2013 0509   GFRAA >90 04/24/2013 0509    No results found for: SPEP, UPEP  Lab Results  Component Value Date   WBC 5.6 01/14/2016   NEUTROABS 3.3 01/14/2016   HGB 13.9 01/14/2016   HCT 41.0 01/14/2016   MCV 92.8 01/14/2016   PLT 167 01/14/2016      Chemistry      Component Value Date/Time   NA 139 01/14/2016 0949   K 4.3 01/14/2016 0949   CL 105 04/24/2013 0509   CO2 23 01/14/2016 0949   BUN 22.0 01/14/2016 0949   CREATININE 0.9 01/14/2016 0949      Component Value Date/Time   CALCIUM 9.1 01/14/2016 0949   ALKPHOS 79 01/14/2016 0949   AST 17 01/14/2016 0949   ALT 14 01/14/2016 0949   BILITOT 0.66 01/14/2016 0949      ASSESSMENT & PLAN:  MGUS (monoclonal gammopathy of unknown significance) Clinically, he has no presence of disease progression. I plan to see him on a yearly basis with history, physical examination and blood work. I recommend influenza vaccination in the fall.  Neuropathy I suspect this is related to chronic alcohol intake. I recommended patient to discontinue alcohol intake but the patient does not believe that alcohol can cause worsening peripheral neuropathy He will continue taking Lyrica for this    Orders Placed This Encounter  Procedures  . CBC with Differential/Platelet    Standing Status:   Future    Standing Expiration Date:   02/24/2017  . Comprehensive metabolic panel    Standing Status:   Future    Standing Expiration Date:   02/24/2017  . Multiple Myeloma Panel (SPEP&IFE w/QIG)    Standing Status:   Future    Standing Expiration Date:   02/24/2017  . Kappa/lambda light chains    Standing Status:   Future    Standing Expiration Date:   02/24/2017   All questions were answered. The patient knows to call the  clinic with any problems, questions or concerns. No barriers to learning was detected. I spent 15 minutes counseling the patient face to face. The total time spent in the appointment was 20 minutes and more than 50% was on counseling and review of test results     [facility], [place], MD 01/21/2016 10:42 AM

## 2016-01-21 NOTE — Assessment & Plan Note (Signed)
I suspect this is related to chronic alcohol intake. I recommended patient to discontinue alcohol intake but the patient does not believe that alcohol can cause worsening peripheral neuropathy He will continue taking Lyrica for this  

## 2016-01-23 ENCOUNTER — Ambulatory Visit (INDEPENDENT_AMBULATORY_CARE_PROVIDER_SITE_OTHER): Payer: Medicare Other | Admitting: Adult Health

## 2016-01-23 ENCOUNTER — Encounter: Payer: Self-pay | Admitting: Adult Health

## 2016-01-23 VITALS — BP 154/78 | HR 57 | Ht 73.0 in | Wt 235.6 lb

## 2016-01-23 DIAGNOSIS — G609 Hereditary and idiopathic neuropathy, unspecified: Secondary | ICD-10-CM

## 2016-01-23 NOTE — Patient Instructions (Signed)
Continue Lyrica.  If your symptoms worsen or you develop new symptoms please let us know.   

## 2016-01-23 NOTE — Progress Notes (Signed)
I have read the note, and I agree with the clinical assessment and plan.  WILLIS,CHARLES KEITH   

## 2016-01-23 NOTE — Progress Notes (Signed)
PATIENT: Daniel Whitaker DOB: 1945-12-15  REASON FOR VISIT: follow up-peripheral neuropathy HISTORY FROM: patient  HISTORY OF PRESENT ILLNESS: Daniel Whitaker is a 70 year old male with a history of peripheral neuropathy. He returns today for follow-up. He continues taking Lyrica 200 mg twice a day. He reports that this is beneficial for his discomfort. He states that he has noticed discomfort when he wakes up in the morning. He describes his pain as a stinging sensation in the feet. He also feels that the toes on the left foot are being pulled. He states once he gets up and puts shoes on this pain subsides. He has not tried wearing  socks at bedtime to see if it offers any benefit. Denies any changes with his walking or balance. Denies falls. Overall he does not feel that his symptoms have gotten worse. He returns today for an evaluation.  HISTORY 07/25/15: Daniel Whitaker is a 70 year old male with a history of a peripheral neuropathy. He returns today for follow-up. The patient is currently taking Lyrica 200 mg twice a day. He does feel that the Lyrica helps with his discomfort. He states that he does feel that the left foot is becoming more numb. He denies any significant changes with his gait or balance. He states occasionally he will stumble but no falls. The patient states that if he plays golf his feet will hurt for several days afterwards. The patient describes his discomfort as a soreness or achy feeling in the feet. Occasionally he'll have the sharp shooting pains in the toes. He does have lidocaine cream but has not used that often. He is unsure of the benefit. The patient states that he has cut back on his consumption about. He states that he normally drinks on Mondays because he plays poker and Fridays when he plays golf. He denies any new symptoms. He returns today for an evaluation.  HISTORY 01/22/15: Daniel Whitaker is a 70 year old right-handed white male with a history of a peripheral  neuropathy. He has to have a MGUS with an IgA antibody. The patient is followed by oncology for this. The patient continues to work, he climbs on a stepladder, but he is remaining safe. He has not had any falls. Occasionally, he may have some dizziness if he bends or stoops over. He has some discomfort in the left greater than right foot associated with the MP joints of the great toes. His feet hurt most significantly when he first gets out of bed and tries to ambulate. Once he is up on his feet for a few moments, this seems to help the discomfort. The patient is on Lyrica taking 200 mg twice daily, he finds that this is adequate to help some of the discomfort and help him sleep at night. He denies any other significant medical issues that have come up since last seen  REVIEW OF SYSTEMS: Out of a complete 14 system review of symptoms, the patient complains only of the following symptoms, and all other reviewed systems are negative.  Restless leg, muscle cramps, hearing loss  ALLERGIES: Allergies  Allergen Reactions  . Cymbalta [Duloxetine Hcl]     Drowsiness, nausea  . Penicillins     HOME MEDICATIONS: Outpatient Medications Prior to Visit  Medication Sig Dispense Refill  . cetirizine (ZYRTEC) 10 MG tablet Take 10 mg by mouth daily.    . Cholecalciferol (VITAMIN D) 2000 UNITS CAPS Take 2,000 Units by mouth.    . Cyanocobalamin (VITAMIN B-12 PO) Take 1 tablet  by mouth daily.    Marland Kitchen ketoconazole (NIZORAL) 2 % cream     . latanoprost (XALATAN) 0.005 % ophthalmic solution     . lidocaine (XYLOCAINE) 5 % ointment Apply 1 application topically 2 (two) times daily as needed. 35.44 g 3  . LYRICA 200 MG capsule TAKE ONE CAPSULE BY MOUTH TWICE A DAY 60 capsule 11  . meloxicam (MOBIC) 15 MG tablet     . multivitamin-iron-minerals-folic acid (CENTRUM) chewable tablet Chew 1 tablet by mouth daily.    . Omega-3 Fatty Acids (FISH OIL) 1000 MG CAPS Take 1,000 mg by mouth daily. 3    . pantoprazole  (PROTONIX) 40 MG tablet     . Pseudoephedrine HCl (SUDAFED 24 HOUR PO) Take by mouth daily.     Marland Kitchen rOPINIRole (REQUIP) 1 MG tablet      No facility-administered medications prior to visit.     PAST MEDICAL HISTORY: Past Medical History:  Diagnosis Date  . Erectile dysfunction   . Esophageal reflux   . Glaucoma   . Hypertriglyceridemia   . Impaired fasting glucose   . MGUS (monoclonal gammopathy of unknown significance)   . Neuropathy associated with MGUS ([place]) 07/11/2013  . Nontoxic uninodular goiter   . Obesity   . Peripheral neuropathy ([place])   . Unspecified deficiency anemia 07/11/2013    PAST SURGICAL HISTORY: Past Surgical History:  Procedure Laterality Date  . COLON RESECTION N/A 04/21/2013   Procedure: DIAGNOSTIC LAPAROSCOPY, lysis of adhesions for partial small bowel obstruction;  Surgeon: Odis Hollingshead, MD;  Location: [facility];  Service: General;  Laterality: N/A;  . KNEE SURGERY    . SMALL INTESTINE SURGERY      FAMILY HISTORY: Family History  Problem Relation Age of Onset  . Brain cancer Mother   . Lung cancer Father   . Heart disease Brother   . Dementia Brother   . Neuropathy Neg Hx     SOCIAL HISTORY: Social History   Social History  . Marital status: Married    Spouse name: [name]  . Number of children: 2  . Years of education: GED   Occupational History  . still working    Social History Main Topics  . Smoking status: Former Smoker    Quit date: 06/01/1984  . Smokeless tobacco: Never Used  . Alcohol use 3.6 - 4.8 oz/week    6 - 8 Cans of beer per week     Comment: 12 BEERS WEEKLY  . Drug use: No  . Sexual activity: Not on file   Other Topics Concern  . Not on file   Social History Narrative   Patient lives at home with his wife [name])   Patient works full-time.   Patient has two children.   Patient is right-handed.   Patient has his GED.   Patient does not drink caffeine.      PHYSICAL EXAM  Vitals:   01/23/16 0928  BP: (!)  154/78  Pulse: (!) 57  Weight: 235 lb 9.6 oz (106.9 kg)  Height: 6\' 1"  (1.854 m)   Body mass index is 31.08 kg/m.  Generalized: Well developed, in no acute distress   Neurological examination  Mentation: Alert oriented to time, place, history taking. Follows all commands speech and language fluent Cranial nerve II-XII: Pupils were equal round reactive to light. Extraocular movements were full, visual field were full on confrontational test. Facial sensation and strength were normal. Uvula tongue midline. Head turning and shoulder shrug  were normal  and symmetric. Motor: The motor testing reveals 5 over 5 strength of all 4 extremities. Good symmetric motor tone is noted throughout.  Sensory: Sensory testing is intact to soft touch on all 4 extremities. Pinprick sensation decreased in the lower extremities in a stocking-like pattern. No evidence of extinction is noted.  Coordination: Cerebellar testing reveals good finger-nose-finger and heel-to-shin bilaterally.  Gait and station: Gait is normal. Tandem gait is normal. Romberg is negative. No drift is seen.  Reflexes: Deep tendon reflexes are symmetric and normal bilaterally.   DIAGNOSTIC DATA (LABS, IMAGING, TESTING) - I reviewed patient records, labs, notes, testing and imaging myself where available.  Lab Results  Component Value Date   WBC 5.6 01/14/2016   HGB 13.9 01/14/2016   HCT 41.0 01/14/2016   MCV 92.8 01/14/2016   PLT 167 01/14/2016      Component Value Date/Time   NA 139 01/14/2016 0949   K 4.3 01/14/2016 0949   CL 105 04/24/2013 0509   CO2 23 01/14/2016 0949   GLUCOSE 116 01/14/2016 0949   BUN 22.0 01/14/2016 0949   CREATININE 0.9 01/14/2016 0949   CALCIUM 9.1 01/14/2016 0949   PROT 6.5 01/14/2016 0950   PROT 6.6 01/14/2016 0949   ALBUMIN 3.7 01/14/2016 0949   AST 17 01/14/2016 0949   ALT 14 01/14/2016 0949   ALKPHOS 79 01/14/2016 0949   BILITOT 0.66 01/14/2016 0949   GFRNONAA 87 (L) 04/24/2013 0509   GFRAA  >90 04/24/2013 0509       ASSESSMENT AND PLAN 70 y.o. year old male  has a past medical history of Erectile dysfunction; Esophageal reflux; Glaucoma; Hypertriglyceridemia; Impaired fasting glucose; MGUS (monoclonal gammopathy of unknown significance); Neuropathy associated with MGUS ([place]) (07/11/2013); Nontoxic uninodular goiter; Obesity; Peripheral neuropathy ([place]); and Unspecified deficiency anemia (07/11/2013). here with:  1. Peripheral neuropathy  The patient will continue on Lyrica 200 mg twice a day. Advised patient that if his symptoms worsen or he develops any new symptoms he should let us know. He will follow-up in 6 months or sooner if needed.     Ward Givens, MSN, NP-C 01/23/2016, 9:49 AM [facility] [address], [place], [place] [address] [phone]

## 2016-01-30 ENCOUNTER — Telehealth: Payer: Self-pay | Admitting: Hematology and Oncology

## 2016-01-30 NOTE — Telephone Encounter (Signed)
CALLED PATIENT TO CONF APPT. N/A. APPT LTR AND SCHD MAILED. 01/30/16

## 2016-04-02 ENCOUNTER — Other Ambulatory Visit: Payer: Self-pay

## 2016-04-02 MED ORDER — PREGABALIN 200 MG PO CAPS: 200.0000 mg | ORAL_CAPSULE | Freq: Two times a day (BID) | ORAL | 5 refills | Status: DC

## 2016-04-02 NOTE — Telephone Encounter (Signed)
Spoke to pharmacy. Previous rx expired (only good for 6 mos). New rx printed per faxed request, awaiting MD signature.

## 2016-04-03 NOTE — Telephone Encounter (Signed)
Rx signed and faxed to pharmacy

## 2016-07-29 ENCOUNTER — Encounter: Payer: Self-pay | Admitting: Neurology

## 2016-07-29 ENCOUNTER — Ambulatory Visit (INDEPENDENT_AMBULATORY_CARE_PROVIDER_SITE_OTHER): Payer: Medicare Other | Admitting: Neurology

## 2016-07-29 VITALS — BP 138/70 | HR 68 | Wt 253.5 lb

## 2016-07-29 DIAGNOSIS — D472 Monoclonal gammopathy: Secondary | ICD-10-CM | POA: Diagnosis not present

## 2016-07-29 DIAGNOSIS — G609 Hereditary and idiopathic neuropathy, unspecified: Secondary | ICD-10-CM | POA: Diagnosis not present

## 2016-07-29 NOTE — Progress Notes (Signed)
Reason for visit: Peripheral neuropathy  Daniel Whitaker is an 71 y.o. male  History of present illness:  Daniel Whitaker is a 71 year old right-handed white male with a history of a IgA monoclonal antibody, he has a peripheral neuropathy as well. The patient has had ongoing discomfort in the feet with a stinging sensations in the toes, he also has problems with bunions, he has some burning and stinging sensation around this area as well. The patient has more difficulty with pain when he is inactive, when he takes his shoes off. The patient begins to have restless leg syndrome when he is sitting in the evening, he indicates that he does not have much problem when he goes to bed, but he does take Requip 1 mg tablet before bedtime. The patient is in general sleeping fairly well, but he may have some stinging in the feet in the early morning hours. The patient denies any significant issues with balance, he has not had any falls. He returns to this office for an evaluation. He claims that he can handle the amount of pain that he is only experiencing, he is on Lyrica taking 200 mg twice daily.  Past Medical History:  Diagnosis Date  . Erectile dysfunction   . Esophageal reflux   . Glaucoma   . Hypertriglyceridemia   . Impaired fasting glucose   . MGUS (monoclonal gammopathy of unknown significance)   . Neuropathy associated with MGUS ([place]) 07/11/2013  . Nontoxic uninodular goiter   . Obesity   . Peripheral neuropathy ([place])   . Unspecified deficiency anemia 07/11/2013    Past Surgical History:  Procedure Laterality Date  . COLON RESECTION N/A 04/21/2013   Procedure: DIAGNOSTIC LAPAROSCOPY, lysis of adhesions for partial small bowel obstruction;  Surgeon: Odis Hollingshead, MD;  Location: [facility];  Service: General;  Laterality: N/A;  . KNEE SURGERY    . SMALL INTESTINE SURGERY      Family History  Problem Relation Age of Onset  . Brain cancer Mother   . Lung cancer Father   . Heart  disease Brother   . Dementia Brother   . Neuropathy Neg Hx     Social history:  reports that he quit smoking about 32 years ago. He has never used smokeless tobacco. He reports that he drinks about 3.6 - 4.8 oz of alcohol per week . He reports that he does not use drugs.    Allergies  Allergen Reactions  . Cymbalta [Duloxetine Hcl]     Drowsiness, nausea  . Penicillins     Medications:  Prior to Admission medications   Medication Sig Start Date End Date Taking? Authorizing Provider  cetirizine (ZYRTEC) 10 MG tablet Take 10 mg by mouth daily.   Yes Historical Provider, MD  Cholecalciferol (VITAMIN D) 2000 UNITS CAPS Take 2,000 Units by mouth.   Yes Historical Provider, MD  Cyanocobalamin (VITAMIN B-12 PO) Take 1 tablet by mouth daily.   Yes Historical Provider, MD  ketoconazole (NIZORAL) 2 % cream  11/28/14  Yes Historical Provider, MD  latanoprost (XALATAN) 0.005 % ophthalmic solution  03/01/13  Yes Historical Provider, MD  lidocaine (XYLOCAINE) 5 % ointment Apply 1 application topically 2 (two) times daily as needed. 07/17/13  Yes Drema Dallas, DO  meloxicam (MOBIC) 15 MG tablet  04/03/13  Yes Historical Provider, MD  multivitamin-iron-minerals-folic acid (CENTRUM) chewable tablet Chew 1 tablet by mouth daily.   Yes Historical Provider, MD  Omega-3 Fatty Acids (FISH OIL) 1000 MG  CAPS Take 1,000 mg by mouth daily. 3   Yes Historical Provider, MD  pantoprazole (PROTONIX) 40 MG tablet  03/14/13  Yes Historical Provider, MD  pregabalin (LYRICA) 200 MG capsule Take 1 capsule (200 mg total) by mouth 2 (two) times daily. 04/02/16  Yes Kathrynn Ducking, MD  Pseudoephedrine HCl (SUDAFED 24 HOUR PO) Take by mouth daily.    Yes Historical Provider, MD  rOPINIRole (REQUIP) 1 MG tablet  11/28/14  Yes Historical Provider, MD    ROS:  Out of a complete 14 system review of symptoms, the patient complains only of the following symptoms, and all other reviewed systems are negative.  Hearing loss,  ringing in the ears, drooling Shortness of breath Restless legs Environmental allergies Urinary urgency Joint pain, muscle cramps Bruising easily Numbness  Blood pressure 138/70, pulse 68, weight 253 lb 8 oz (115 kg), SpO2 97 %.  Physical Exam  General: The patient is alert and cooperative at the time of the examination.  Skin: No significant peripheral edema is noted.   Neurologic Exam  Mental status: The patient is alert and oriented x 3 at the time of the examination. The patient has apparent normal recent and remote memory, with an apparently normal attention span and concentration ability.   Cranial nerves: Facial symmetry is present. Speech is normal, no aphasia or dysarthria is noted. Extraocular movements are full. Visual fields are full.  Motor: The patient has good strength in all 4 extremities.  Sensory examination: Soft touch sensation is symmetric on the face, arms, and legs.  Coordination: The patient has good finger-nose-finger and heel-to-shin bilaterally.  Gait and station: The patient has a normal gait. Tandem gait is normal. Romberg is negative. No drift is seen.  Reflexes: Deep tendon reflexes are symmetric, but are somewhat depressed.   Assessment/Plan:  1. Peripheral neuropathy  The patient continues to have some discomfort, but he does not wish to go on the medication dosing or have another medication added to the regimen. The patient has not been able to tolerate Cymbalta, but in the future Topamax or Keppra could be used. The patient will follow-up in 8 months, he will contact our office if any new issues arise. Fortunately, he has not had any gait instability. He is having problems with the toes of the left foot curling down.  Jill Alexanders MD 07/29/2016 10:16 AM  [facility] [address] [place], [place] [address]  Phone [phone] Fax [phone]

## 2016-10-15 ENCOUNTER — Other Ambulatory Visit: Payer: Self-pay | Admitting: Neurology

## 2016-10-15 ENCOUNTER — Other Ambulatory Visit: Payer: Self-pay | Admitting: *Deleted

## 2016-10-15 NOTE — Telephone Encounter (Signed)
Refill faxed to [facility]. Received confirmation of successful fax.

## 2017-01-12 ENCOUNTER — Other Ambulatory Visit (HOSPITAL_BASED_OUTPATIENT_CLINIC_OR_DEPARTMENT_OTHER): Payer: Medicare Other

## 2017-01-12 DIAGNOSIS — D472 Monoclonal gammopathy: Secondary | ICD-10-CM | POA: Diagnosis not present

## 2017-01-12 LAB — CBC WITH DIFFERENTIAL/PLATELET
BASO%: 1.4 % (ref 0.0–2.0)
BASOS ABS: 0.1 10*3/uL (ref 0.0–0.1)
EOS%: 3.9 % (ref 0.0–7.0)
Eosinophils Absolute: 0.2 10*3/uL (ref 0.0–0.5)
HCT: 37.4 % — ABNORMAL LOW (ref 38.4–49.9)
HEMOGLOBIN: 12.9 g/dL — AB (ref 13.0–17.1)
LYMPH%: 30.4 % (ref 14.0–49.0)
MCH: 32.6 pg (ref 27.2–33.4)
MCHC: 34.6 g/dL (ref 32.0–36.0)
MCV: 94.2 fL (ref 79.3–98.0)
MONO#: 0.5 10*3/uL (ref 0.1–0.9)
MONO%: 8.1 % (ref 0.0–14.0)
NEUT#: 3.2 10*3/uL (ref 1.5–6.5)
NEUT%: 56.2 % (ref 39.0–75.0)
Platelets: 155 10*3/uL (ref 140–400)
RBC: 3.97 10*6/uL — ABNORMAL LOW (ref 4.20–5.82)
RDW: 13.5 % (ref 11.0–14.6)
WBC: 5.7 10*3/uL (ref 4.0–10.3)
lymph#: 1.7 10*3/uL (ref 0.9–3.3)

## 2017-01-12 LAB — COMPREHENSIVE METABOLIC PANEL
ALT: 18 U/L (ref 0–55)
AST: 20 U/L (ref 5–34)
Albumin: 3.6 g/dL (ref 3.5–5.0)
Alkaline Phosphatase: 81 U/L (ref 40–150)
Anion Gap: 8 mEq/L (ref 3–11)
BUN: 21.8 mg/dL (ref 7.0–26.0)
CHLORIDE: 108 meq/L (ref 98–109)
CO2: 23 meq/L (ref 22–29)
Calcium: 9 mg/dL (ref 8.4–10.4)
Creatinine: 0.9 mg/dL (ref 0.7–1.3)
EGFR: 86 mL/min/{1.73_m2} — ABNORMAL LOW (ref >90)
GLUCOSE: 98 mg/dL (ref 70–140)
POTASSIUM: 4.3 meq/L (ref 3.5–5.1)
SODIUM: 139 meq/L (ref 136–145)
Total Bilirubin: 0.58 mg/dL (ref 0.20–1.20)
Total Protein: 6.4 g/dL (ref 6.4–8.3)

## 2017-01-13 LAB — KAPPA/LAMBDA LIGHT CHAINS
Ig Kappa Free Light Chain: 17 mg/L (ref 3.3–19.4)
Ig Lambda Free Light Chain: 10.9 mg/L (ref 5.7–26.3)
Kappa/Lambda FluidC Ratio: 1.56 (ref 0.26–1.65)

## 2017-01-14 LAB — MULTIPLE MYELOMA PANEL, SERUM
ALBUMIN/GLOB SERPL: 1.5 (ref 0.7–1.7)
ALPHA 1: 0.2 g/dL (ref 0.0–0.4)
Albumin SerPl Elph-Mcnc: 3.6 g/dL (ref 2.9–4.4)
Alpha2 Glob SerPl Elph-Mcnc: 0.6 g/dL (ref 0.4–1.0)
B-Globulin SerPl Elph-Mcnc: 0.8 g/dL (ref 0.7–1.3)
GAMMA GLOB SERPL ELPH-MCNC: 0.9 g/dL (ref 0.4–1.8)
GLOBULIN, TOTAL: 2.5 g/dL (ref 2.2–3.9)
IGA/IMMUNOGLOBULIN A, SERUM: 355 mg/dL (ref 61–437)
IGG (IMMUNOGLOBIN G), SERUM: 619 mg/dL — AB (ref 700–1600)
IgM, Qn, Serum: 54 mg/dL (ref 20–172)
M Protein SerPl Elph-Mcnc: 0.2 g/dL — ABNORMAL HIGH
Total Protein: 6.1 g/dL (ref 6.0–8.5)

## 2017-01-19 ENCOUNTER — Telehealth: Payer: Self-pay | Admitting: Hematology and Oncology

## 2017-01-19 ENCOUNTER — Ambulatory Visit (HOSPITAL_BASED_OUTPATIENT_CLINIC_OR_DEPARTMENT_OTHER): Payer: Medicare Other | Admitting: Hematology and Oncology

## 2017-01-19 ENCOUNTER — Encounter: Payer: Self-pay | Admitting: Hematology and Oncology

## 2017-01-19 VITALS — BP 144/79 | HR 58 | Temp 98.4°F | Resp 20 | Ht 73.0 in | Wt 244.8 lb

## 2017-01-19 DIAGNOSIS — D472 Monoclonal gammopathy: Secondary | ICD-10-CM | POA: Diagnosis not present

## 2017-01-19 DIAGNOSIS — F1010 Alcohol abuse, uncomplicated: Secondary | ICD-10-CM

## 2017-01-19 DIAGNOSIS — G629 Polyneuropathy, unspecified: Secondary | ICD-10-CM | POA: Diagnosis not present

## 2017-01-19 NOTE — Telephone Encounter (Signed)
Scheduled appr per 8/21 los - gave patient AVS and calender per los.

## 2017-01-20 NOTE — Progress Notes (Signed)
[facility] OFFICE PROGRESS NOTE  Patient Care Team: [name], MD as PCP - General (Family Medicine)  SUMMARY OF ONCOLOGIC HISTORY:  This is a pleasant patient with severe peripheral neuropathy was referred here for evaluation of abnormal serum protein electrophoresis. Further studies revealed monoclonal gammopathy of unknown significance that is not the cause of his peripheral neuropathy, IgA kappa subtype. The peripheral neuropathy was thought to be related to alcoholism. He is being observed.  INTERVAL HISTORY: Please see below for problem oriented charting. He returns for further follow-up He continues to drink beer on a regular basis He continues to take Lyrica for chronic pain management and for management of neuropathy He denies recent infection No new bone pain.  REVIEW OF SYSTEMS:   Constitutional: Denies fevers, chills or abnormal weight loss Eyes: Denies blurriness of vision Ears, nose, mouth, throat, and face: Denies mucositis or sore throat Respiratory: Denies cough, dyspnea or wheezes Cardiovascular: Denies palpitation, chest discomfort or lower extremity swelling Gastrointestinal:  Denies nausea, heartburn or change in bowel habits Skin: Denies abnormal skin rashes Lymphatics: Denies new lymphadenopathy or easy bruising Neurological:Denies numbness, tingling or new weaknesses Behavioral/Psych: Mood is stable, no new changes  All other systems were reviewed with the patient and are negative.  I have reviewed the past medical history, past surgical history, social history and family history with the patient and they are unchanged from previous note.  ALLERGIES:  is allergic to cymbalta [duloxetine hcl] and penicillins.  MEDICATIONS:  Current Outpatient Prescriptions  Medication Sig Dispense Refill  . aspirin EC 81 MG tablet Take 81 mg by mouth daily.    . cetirizine (ZYRTEC) 10 MG tablet Take 10 mg by mouth daily.    . Cholecalciferol (VITAMIN  D) 2000 UNITS CAPS Take 2,000 Units by mouth.    . Cyanocobalamin (VITAMIN B-12 PO) Take 1 tablet by mouth daily.    Marland Kitchen ketoconazole (NIZORAL) 2 % cream     . latanoprost (XALATAN) 0.005 % ophthalmic solution     . lidocaine (XYLOCAINE) 5 % ointment Apply 1 application topically 2 (two) times daily as needed. 35.44 g 3  . LYRICA 200 MG capsule TAKE 1 CAPSULE BY MOUTH TWICE DAILY 60 capsule 5  . meloxicam (MOBIC) 15 MG tablet     . multivitamin-iron-minerals-folic acid (CENTRUM) chewable tablet Chew 1 tablet by mouth daily.    . Omega-3 Fatty Acids (FISH OIL) 1000 MG CAPS Take 1,000 mg by mouth daily. 3    . pantoprazole (PROTONIX) 40 MG tablet     . rOPINIRole (REQUIP) 1 MG tablet      No current facility-administered medications for this visit.     PHYSICAL EXAMINATION: ECOG PERFORMANCE STATUS: 0 - Asymptomatic  Vitals:   01/19/17 1021  BP: (!) 144/79  Pulse: (!) 58  Resp: 20  Temp: 98.4 F (36.9 C)  SpO2: 96%   Filed Weights   01/19/17 1021  Weight: 244 lb 12.8 oz (111 kg)    GENERAL:alert, no distress and comfortable SKIN: skin color, texture, turgor are normal, no rashes or significant lesions EYES: normal, Conjunctiva are pink and non-injected, sclera clear OROPHARYNX:no exudate, no erythema and lips, buccal mucosa, and tongue normal  NECK: supple, thyroid normal size, non-tender, without nodularity LYMPH:  no palpable lymphadenopathy in the cervical, axillary or inguinal LUNGS: clear to auscultation and percussion with normal breathing effort HEART: regular rate & rhythm and no murmurs and no lower extremity edema ABDOMEN:abdomen soft, non-tender and normal bowel sounds Musculoskeletal:no  cyanosis of digits and no clubbing  NEURO: alert & oriented x 3 with fluent speech, no focal motor/sensory deficits  LABORATORY DATA:  I have reviewed the data as listed    Component Value Date/Time   NA 139 01/12/2017 1011   K 4.3 01/12/2017 1011   CL 105 04/24/2013 0509    CO2 23 01/12/2017 1011   GLUCOSE 98 01/12/2017 1011   BUN 21.8 01/12/2017 1011   CREATININE 0.9 01/12/2017 1011   CALCIUM 9.0 01/12/2017 1011   PROT 6.4 01/12/2017 1011   ALBUMIN 3.6 01/12/2017 1011   AST 20 01/12/2017 1011   ALT 18 01/12/2017 1011   ALKPHOS 81 01/12/2017 1011   BILITOT 0.58 01/12/2017 1011   GFRNONAA 87 (L) 04/24/2013 0509   GFRAA >90 04/24/2013 0509    No results found for: SPEP, UPEP  Lab Results  Component Value Date   WBC 5.7 01/12/2017   NEUTROABS 3.2 01/12/2017   HGB 12.9 (L) 01/12/2017   HCT 37.4 (L) 01/12/2017   MCV 94.2 01/12/2017   PLT 155 01/12/2017      Chemistry      Component Value Date/Time   NA 139 01/12/2017 1011   K 4.3 01/12/2017 1011   CL 105 04/24/2013 0509   CO2 23 01/12/2017 1011   BUN 21.8 01/12/2017 1011   CREATININE 0.9 01/12/2017 1011      Component Value Date/Time   CALCIUM 9.0 01/12/2017 1011   ALKPHOS 81 01/12/2017 1011   AST 20 01/12/2017 1011   ALT 18 01/12/2017 1011   BILITOT 0.58 01/12/2017 1011      ASSESSMENT & PLAN:  MGUS (monoclonal gammopathy of unknown significance) Clinically, he has no presence of disease progression. I plan to see him on a yearly basis with history, physical examination and blood work. I recommend influenza vaccination in the fall.  Neuropathy I suspect this is related to chronic alcohol intake. I recommended patient to discontinue alcohol intake but the patient does not believe that alcohol can cause worsening peripheral neuropathy He will continue taking Lyrica for this    Orders Placed This Encounter  Procedures  . CBC with Differential/Platelet    Standing Status:   Future    Standing Expiration Date:   02/23/2018  . Comprehensive metabolic panel    Standing Status:   Future    Standing Expiration Date:   02/23/2018  . Kappa/lambda light chains    Standing Status:   Future    Standing Expiration Date:   02/23/2018  . Multiple Myeloma Panel (SPEP&IFE w/QIG)    Standing  Status:   Future    Standing Expiration Date:   02/23/2018   All questions were answered. The patient knows to call the clinic with any problems, questions or concerns. No barriers to learning was detected. I spent 15 minutes counseling the patient face to face. The total time spent in the appointment was 20 minutes and more than 50% was on counseling and review of test results     Heath Lark, MD 01/20/2017 12:06 PM

## 2017-01-20 NOTE — Assessment & Plan Note (Signed)
I suspect this is related to chronic alcohol intake. I recommended patient to discontinue alcohol intake but the patient does not believe that alcohol can cause worsening peripheral neuropathy He will continue taking Lyrica for this

## 2017-01-20 NOTE — Assessment & Plan Note (Signed)
Clinically, he has no presence of disease progression. I plan to see him on a yearly basis with history, physical examination and blood work. I recommend influenza vaccination in the fall.

## 2017-02-09 ENCOUNTER — Other Ambulatory Visit: Payer: Self-pay | Admitting: Family Medicine

## 2017-02-09 DIAGNOSIS — M5430 Sciatica, unspecified side: Secondary | ICD-10-CM

## 2017-02-24 ENCOUNTER — Ambulatory Visit
Admission: RE | Admit: 2017-02-24 | Discharge: 2017-02-24 | Disposition: A | Discharge status: Home / Self Care | Payer: Medicare Other | Source: Ambulatory Visit | Attending: Family Medicine | Admitting: Family Medicine

## 2017-02-24 DIAGNOSIS — M5430 Sciatica, unspecified side: Secondary | ICD-10-CM

## 2017-02-24 MED ORDER — GADOBENATE DIMEGLUMINE 529 MG/ML IV SOLN
20.0000 mL | Freq: Once | INTRAVENOUS | Status: AC | PRN
  Administered 2017-02-24: 20 mL via INTRAVENOUS

## 2017-03-15 DIAGNOSIS — M5416 Radiculopathy, lumbar region: Secondary | ICD-10-CM | POA: Insufficient documentation

## 2017-03-29 ENCOUNTER — Ambulatory Visit (INDEPENDENT_AMBULATORY_CARE_PROVIDER_SITE_OTHER): Payer: Medicare Other | Admitting: Adult Health

## 2017-03-29 ENCOUNTER — Encounter: Payer: Self-pay | Admitting: Adult Health

## 2017-03-29 VITALS — BP 142/78 | HR 67 | Wt 247.0 lb

## 2017-03-29 DIAGNOSIS — G2581 Restless legs syndrome: Secondary | ICD-10-CM

## 2017-03-29 DIAGNOSIS — G63 Polyneuropathy in diseases classified elsewhere: Secondary | ICD-10-CM | POA: Diagnosis not present

## 2017-03-29 DIAGNOSIS — D472 Monoclonal gammopathy: Secondary | ICD-10-CM

## 2017-03-29 MED ORDER — PREGABALIN 200 MG PO CAPS: 200.0000 mg | ORAL_CAPSULE | Freq: Two times a day (BID) | ORAL | 5 refills | Status: DC

## 2017-03-29 NOTE — Progress Notes (Signed)
PATIENT: Daniel Whitaker DOB: Apr 19, 1946  REASON FOR VISIT: follow up HISTORY FROM: patient  HISTORY OF PRESENT ILLNESS: Today 03/29/17 Mr. Yielding is a 71 year old male with a history of peripheral neuropathy and restless leg symptoms. He returns today for follow-up. He reports that he continues to have burning and tingling primarily in the toes. This typically occurs when he is inactive. He denies any significant changes with his gait or balance. Sometimes he is staggering however he denies any falls. He reports that he very rarely has has to use requip. Most of the time if he begins to develop restless legs he can typically go lay down and the symptoms resolve. He remains on Lyrica 200 mg twice a day. He states that he is happy with his current regimen and is not interested in changing or adjusting his dosing.  HISTORY [name] is a 71 year old right-handed white male with a history of a IgA monoclonal antibody, he has a peripheral neuropathy as well. The patient has had ongoing discomfort in the feet with a stinging sensations in the toes, he also has problems with bunions, he has some burning and stinging sensation around this area as well. The patient has more difficulty with pain when he is inactive, when he takes his shoes off. The patient begins to have restless leg syndrome when he is sitting in the evening, he indicates that he does not have much problem when he goes to bed, but he does take Requip 1 mg tablet before bedtime. The patient is in general sleeping fairly well, but he may have some stinging in the feet in the early morning hours. The patient denies any significant issues with balance, he has not had any falls. He returns to this office for an evaluation. He claims that he can handle the amount of pain that he is only experiencing, he is on Lyrica taking 200 mg twice daily.   REVIEW OF SYSTEMS: Out of a complete 14 system review of symptoms, the patient complains only of  the following symptoms, and all other reviewed systems are negative.  See HPI  ALLERGIES: Allergies  Allergen Reactions  . Cymbalta [Duloxetine Hcl]     Drowsiness, nausea  . Penicillins     HOME MEDICATIONS: Outpatient Medications Prior to Visit  Medication Sig Dispense Refill  . aspirin EC 81 MG tablet Take 81 mg by mouth daily.    . cetirizine (ZYRTEC) 10 MG tablet Take 10 mg by mouth daily.    . Cholecalciferol (VITAMIN D) 2000 UNITS CAPS Take 2,000 Units by mouth.    . Cyanocobalamin (VITAMIN B-12 PO) Take 1 tablet by mouth daily.    Marland Kitchen ketoconazole (NIZORAL) 2 % cream     . latanoprost (XALATAN) 0.005 % ophthalmic solution     . lidocaine (XYLOCAINE) 5 % ointment Apply 1 application topically 2 (two) times daily as needed. 35.44 g 3  . LYRICA 200 MG capsule TAKE 1 CAPSULE BY MOUTH TWICE DAILY 60 capsule 5  . meloxicam (MOBIC) 15 MG tablet     . multivitamin-iron-minerals-folic acid (CENTRUM) chewable tablet Chew 1 tablet by mouth daily.    . Omega-3 Fatty Acids (FISH OIL) 1000 MG CAPS Take 1,000 mg by mouth daily. 3    . pantoprazole (PROTONIX) 40 MG tablet     . rOPINIRole (REQUIP) 1 MG tablet      No facility-administered medications prior to visit.     PAST MEDICAL HISTORY: Past Medical History:  Diagnosis Date  .  Erectile dysfunction   . Esophageal reflux   . Glaucoma   . Hypertriglyceridemia   . Impaired fasting glucose   . MGUS (monoclonal gammopathy of unknown significance)   . Neuropathy associated with MGUS ([place]) 07/11/2013  . Nontoxic uninodular goiter   . Obesity   . Peripheral neuropathy   . Spinal stenosis   . Unspecified deficiency anemia 07/11/2013    PAST SURGICAL HISTORY: Past Surgical History:  Procedure Laterality Date  . COLON RESECTION N/A 04/21/2013   Procedure: DIAGNOSTIC LAPAROSCOPY, lysis of adhesions for partial small bowel obstruction;  Surgeon: Odis Hollingshead, MD;  Location: [facility];  Service: General;  Laterality: N/A;  . KNEE  SURGERY    . SMALL INTESTINE SURGERY      FAMILY HISTORY: Family History  Problem Relation Age of Onset  . Brain cancer Mother   . Lung cancer Father   . Heart disease Brother   . Dementia Brother   . Neuropathy Neg Hx     SOCIAL HISTORY: Social History   Social History  . Marital status: Married    Spouse name: [name]  . Number of children: 2  . Years of education: GED   Occupational History  . still working    Social History Main Topics  . Smoking status: Former Smoker    Quit date: 06/01/1984  . Smokeless tobacco: Never Used  . Alcohol use 3.6 - 4.8 oz/week    6 - 8 Cans of beer per week     Comment: 12 BEERS WEEKLY  . Drug use: No  . Sexual activity: Not on file   Other Topics Concern  . Not on file   Social History Narrative   Patient lives at home with his wife [name])   Patient works full-time.   Patient has two children.   Patient is right-handed.   Patient has his GED.   Patient does not drink caffeine.      PHYSICAL EXAM  Vitals:   03/29/17 0920  BP: (!) 142/78  Pulse: 67  Weight: 247 lb (112 kg)   Body mass index is 32.59 kg/m.  Generalized: Well developed, in no acute distress   Neurological examination  Mentation: Alert oriented to time, place, history taking. Follows all commands speech and language fluent Cranial nerve II-XII: Pupils were equal round reactive to light. Extraocular movements were full, visual field were full on confrontational test. Facial sensation and strength were normal. Uvula tongue midline. Head turning and shoulder shrug  were normal and symmetric. Motor: The motor testing reveals 5 over 5 strength of all 4 extremities. Good symmetric motor tone is noted throughout.  Sensory: Sensory testing is intact to soft touch on all 4 extremities. No evidence of extinction is noted.  Coordination: Cerebellar testing reveals good finger-nose-finger and heel-to-shin bilaterally.  Gait and station: Gait is normal. .  Reflexes:  Deep tendon reflexes are symmetric and normal bilaterally.   DIAGNOSTIC DATA (LABS, IMAGING, TESTING) - I reviewed patient records, labs, notes, testing and imaging myself where available.  Lab Results  Component Value Date   WBC 5.7 01/12/2017   HGB 12.9 (L) 01/12/2017   HCT 37.4 (L) 01/12/2017   MCV 94.2 01/12/2017   PLT 155 01/12/2017      Component Value Date/Time   NA 139 01/12/2017 1011   K 4.3 01/12/2017 1011   CL 105 04/24/2013 0509   CO2 23 01/12/2017 1011   GLUCOSE 98 01/12/2017 1011   BUN 21.8 01/12/2017 1011   CREATININE  0.9 01/12/2017 1011   CALCIUM 9.0 01/12/2017 1011   PROT 6.4 01/12/2017 1011   ALBUMIN 3.6 01/12/2017 1011   AST 20 01/12/2017 1011   ALT 18 01/12/2017 1011   ALKPHOS 81 01/12/2017 1011   BILITOT 0.58 01/12/2017 1011   GFRNONAA 87 (L) 04/24/2013 0509   GFRAA >90 04/24/2013 0509   No results found for: CHOL, HDL, LDLCALC, LDLDIRECT, TRIG, CHOLHDL Lab Results  Component Value Date   HGBA1C 5.2 04/15/2013   Lab Results  Component Value Date   VITAMINB12 390 07/13/2013   Lab Results  Component Value Date   TSH 1.195 04/16/2013      ASSESSMENT AND PLAN 71 y.o. year old male  has a past medical history of Erectile dysfunction; Esophageal reflux; Glaucoma; Hypertriglyceridemia; Impaired fasting glucose; MGUS (monoclonal gammopathy of unknown significance); Neuropathy associated with MGUS ([place]) (07/11/2013); Nontoxic uninodular goiter; Obesity; Peripheral neuropathy; Spinal stenosis; and Unspecified deficiency anemia (07/11/2013). here with:  1. Peripheral neuropathy 2. Restless leg symptoms  Overall the patient is doing well. He will continue on Lyrica 200 mg twice a day. He will continue to use Requip 1 mg 4 bedtime if needed for restless leg symptoms. He is advised that if his symptoms worsen or he develops new symptoms he should let us know. He will follow-up in one year or sooner if needed.      Ward Givens, MSN, NP-C 03/29/2017,  9:36 AM [facility] [address] [place], [place] [address] [phone]

## 2017-03-29 NOTE — Progress Notes (Signed)
Lyricarefill Rx successfully faxed to [facility].

## 2017-03-29 NOTE — Patient Instructions (Signed)
Your Plan:  Continue lyrica 200 mg twice a day Use Requip 1 mg at bedtime if needed for restless leg If your symptoms worsen or you develop new symptoms please let us know.   Thank you for coming to see [place] at [facility]. I hope we have been able to provide you high quality care today.  You may receive a patient satisfaction survey over the next few weeks. We would appreciate your feedback and comments so that we may continue to improve ourselves and the health of our patients.

## 2017-03-30 NOTE — Progress Notes (Signed)
I have reviewed and agreed above plan. 

## 2017-09-27 ENCOUNTER — Other Ambulatory Visit: Payer: Self-pay | Admitting: Adult Health

## 2018-01-11 ENCOUNTER — Inpatient Hospital Stay: Payer: Medicare Other | Attending: Hematology and Oncology

## 2018-01-11 DIAGNOSIS — G629 Polyneuropathy, unspecified: Secondary | ICD-10-CM | POA: Insufficient documentation

## 2018-01-11 DIAGNOSIS — Z79899 Other long term (current) drug therapy: Secondary | ICD-10-CM | POA: Insufficient documentation

## 2018-01-11 DIAGNOSIS — D472 Monoclonal gammopathy: Secondary | ICD-10-CM | POA: Insufficient documentation

## 2018-01-11 LAB — COMPREHENSIVE METABOLIC PANEL
ALBUMIN: 3.8 g/dL (ref 3.5–5.0)
ALT: 22 U/L (ref 0–44)
ANION GAP: 12 (ref 5–15)
AST: 22 U/L (ref 15–41)
Alkaline Phosphatase: 86 U/L (ref 38–126)
BILIRUBIN TOTAL: 0.7 mg/dL (ref 0.3–1.2)
BUN: 27 mg/dL — ABNORMAL HIGH (ref 8–23)
CO2: 21 mmol/L — ABNORMAL LOW (ref 22–32)
Calcium: 9 mg/dL (ref 8.9–10.3)
Chloride: 108 mmol/L (ref 98–111)
Creatinine, Ser: 0.85 mg/dL (ref 0.61–1.24)
GFR calc Af Amer: >60 mL/min (ref >60)
GFR calc non Af Amer: >60 mL/min (ref >60)
Glucose, Bld: 110 mg/dL — ABNORMAL HIGH (ref 70–99)
POTASSIUM: 4.6 mmol/L (ref 3.5–5.1)
Sodium: 141 mmol/L (ref 135–145)
TOTAL PROTEIN: 6.6 g/dL (ref 6.5–8.1)

## 2018-01-11 LAB — CBC WITH DIFFERENTIAL/PLATELET
BASOS ABS: 0.1 10*3/uL (ref 0.0–0.1)
Basophils Relative: 2 %
Eosinophils Absolute: 0.2 10*3/uL (ref 0.0–0.5)
Eosinophils Relative: 4 %
HEMATOCRIT: 39.4 % (ref 38.4–49.9)
HEMOGLOBIN: 13.3 g/dL (ref 13.0–17.1)
LYMPHS PCT: 31 %
Lymphs Abs: 1.8 10*3/uL (ref 0.9–3.3)
MCH: 31.2 pg (ref 27.2–33.4)
MCHC: 33.9 g/dL (ref 32.0–36.0)
MCV: 92.3 fL (ref 79.3–98.0)
Monocytes Absolute: 0.5 10*3/uL (ref 0.1–0.9)
Monocytes Relative: 8 %
NEUTROS ABS: 3.3 10*3/uL (ref 1.5–6.5)
NEUTROS PCT: 55 %
Platelets: 173 10*3/uL (ref 140–400)
RBC: 4.26 MIL/uL (ref 4.20–5.82)
RDW: 13.9 % (ref 11.0–14.6)
WBC: 5.9 10*3/uL (ref 4.0–10.3)

## 2018-01-12 LAB — KAPPA/LAMBDA LIGHT CHAINS
KAPPA, LAMDA LIGHT CHAIN RATIO: 2.14 — AB (ref 0.26–1.65)
Kappa free light chain: 20.3 mg/L — ABNORMAL HIGH (ref 3.3–19.4)
Lambda free light chains: 9.5 mg/L (ref 5.7–26.3)

## 2018-01-13 LAB — MULTIPLE MYELOMA PANEL, SERUM
ALBUMIN/GLOB SERPL: 1.6 (ref 0.7–1.7)
ALPHA2 GLOB SERPL ELPH-MCNC: 0.6 g/dL (ref 0.4–1.0)
Albumin SerPl Elph-Mcnc: 3.9 g/dL (ref 2.9–4.4)
Alpha 1: 0.2 g/dL (ref 0.0–0.4)
B-GLOBULIN SERPL ELPH-MCNC: 0.9 g/dL (ref 0.7–1.3)
Gamma Glob SerPl Elph-Mcnc: 0.9 g/dL (ref 0.4–1.8)
Globulin, Total: 2.6 g/dL (ref 2.2–3.9)
IGG (IMMUNOGLOBIN G), SERUM: 695 mg/dL — AB (ref 700–1600)
IgA: 397 mg/dL (ref 61–437)
IgM (Immunoglobulin M), Srm: 62 mg/dL (ref 15–143)
M PROTEIN SERPL ELPH-MCNC: 0.2 g/dL — AB
TOTAL PROTEIN ELP: 6.5 g/dL (ref 6.0–8.5)

## 2018-01-14 ENCOUNTER — Other Ambulatory Visit: Payer: Self-pay | Admitting: Family Medicine

## 2018-01-14 DIAGNOSIS — E041 Nontoxic single thyroid nodule: Secondary | ICD-10-CM

## 2018-01-18 ENCOUNTER — Telehealth: Payer: Self-pay | Admitting: Hematology and Oncology

## 2018-01-18 ENCOUNTER — Inpatient Hospital Stay: Payer: Medicare Other | Admitting: Hematology and Oncology

## 2018-01-18 ENCOUNTER — Encounter: Payer: Self-pay | Admitting: Hematology and Oncology

## 2018-01-18 DIAGNOSIS — G629 Polyneuropathy, unspecified: Secondary | ICD-10-CM

## 2018-01-18 DIAGNOSIS — Z79899 Other long term (current) drug therapy: Secondary | ICD-10-CM

## 2018-01-18 DIAGNOSIS — D472 Monoclonal gammopathy: Secondary | ICD-10-CM | POA: Diagnosis not present

## 2018-01-18 NOTE — Progress Notes (Signed)
[facility] OFFICE PROGRESS NOTE  Patient Care Team: [name], MD as PCP - General (Family Medicine)  ASSESSMENT & PLAN:  MGUS (monoclonal gammopathy of unknown significance) Clinically, he has no presence of disease progression. I plan to see him on a yearly basis with history, physical examination and blood work. I recommend influenza vaccination in the fall.  Neuropathy I suspect this is related to chronic alcohol intake. I recommended patient to discontinue alcohol intake but the patient does not believe that alcohol can cause worsening peripheral neuropathy but he appears motivated to try to cut back on alcohol intake He will continue taking Lyrica for this I will defer to his neurologist for further management    Orders Placed This Encounter  Procedures  . Comprehensive metabolic panel    Standing Status:   Future    Standing Expiration Date:   02/22/2019  . CBC with Differential/Platelet    Standing Status:   Future    Standing Expiration Date:   02/22/2019  . Kappa/lambda light chains    Standing Status:   Future    Standing Expiration Date:   02/22/2019  . Multiple Myeloma Panel (SPEP&IFE w/QIG)    Standing Status:   Future    Standing Expiration Date:   02/22/2019    INTERVAL HISTORY: Please see below for problem oriented charting. He returns for follow-up on IgA kappa MGUS He felt that neuropathy is slightly worse He has not stopped drinking alcohol Denies recent infection, fever or chills No recent bone pain  SUMMARY OF ONCOLOGIC HISTORY:  This is a pleasant patient with severe peripheral neuropathy was referred here for evaluation of abnormal serum protein electrophoresis. Further studies revealed monoclonal gammopathy of unknown significance that is not the cause of his peripheral neuropathy, IgA kappa subtype. The peripheral neuropathy was thought to be related to alcoholism. He is being observed.   REVIEW OF SYSTEMS:   Constitutional:  Denies fevers, chills or abnormal weight loss Eyes: Denies blurriness of vision Ears, nose, mouth, throat, and face: Denies mucositis or sore throat Respiratory: Denies cough, dyspnea or wheezes Cardiovascular: Denies palpitation, chest discomfort or lower extremity swelling Gastrointestinal:  Denies nausea, heartburn or change in bowel habits Skin: Denies abnormal skin rashes Lymphatics: Denies new lymphadenopathy or easy bruising Neurological:Denies numbness, tingling or new weaknesses Behavioral/Psych: Mood is stable, no new changes  All other systems were reviewed with the patient and are negative.  I have reviewed the past medical history, past surgical history, social history and family history with the patient and they are unchanged from previous note.  ALLERGIES:  is allergic to cymbalta [duloxetine hcl] and penicillins.  MEDICATIONS:  Current Outpatient Medications  Medication Sig Dispense Refill  . aspirin EC 81 MG tablet Take 81 mg by mouth daily.    . cetirizine (ZYRTEC) 10 MG tablet Take 10 mg by mouth daily.    . Cholecalciferol (VITAMIN D) 2000 UNITS CAPS Take 2,000 Units by mouth.    . Cyanocobalamin (VITAMIN B-12 PO) Take 1 tablet by mouth daily.    Marland Kitchen ketoconazole (NIZORAL) 2 % cream     . latanoprost (XALATAN) 0.005 % ophthalmic solution     . lidocaine (XYLOCAINE) 5 % ointment Apply 1 application topically 2 (two) times daily as needed. 35.44 g 3  . LYRICA 200 MG capsule TAKE 1 CAPSULE BY MOUTH TWICE DAILY 60 capsule 5  . meloxicam (MOBIC) 15 MG tablet     . multivitamin-iron-minerals-folic acid (CENTRUM) chewable tablet Chew 1 tablet  by mouth daily.    . Omega-3 Fatty Acids (FISH OIL) 1000 MG CAPS Take 1,000 mg by mouth daily. 3    . pantoprazole (PROTONIX) 40 MG tablet     . rOPINIRole (REQUIP) 1 MG tablet     . UNABLE TO FIND Med Name: Visical, HAIR VITAMIN     No current facility-administered medications for this visit.     PHYSICAL EXAMINATION: ECOG  PERFORMANCE STATUS: 1 - Symptomatic but completely ambulatory  Vitals:   01/18/18 1218  BP: (!) 143/68  Pulse: 63  Resp: 18  Temp: (!) 97.5 F (36.4 C)  SpO2: 97%   Filed Weights   01/18/18 1218  Weight: 257 lb 6.4 oz (116.8 kg)    GENERAL:alert, no distress and comfortable SKIN: skin color, texture, turgor are normal, no rashes or significant lesions EYES: normal, Conjunctiva are pink and non-injected, sclera clear OROPHARYNX:no exudate, no erythema and lips, buccal mucosa, and tongue normal  NECK: supple, thyroid normal size, non-tender, without nodularity LYMPH:  no palpable lymphadenopathy in the cervical, axillary or inguinal LUNGS: clear to auscultation and percussion with normal breathing effort HEART: regular rate & rhythm and no murmurs and no lower extremity edema ABDOMEN:abdomen soft, non-tender and normal bowel sounds Musculoskeletal:no cyanosis of digits and no clubbing  NEURO: alert & oriented x 3 with fluent speech, no focal motor/sensory deficits  LABORATORY DATA:  I have reviewed the data as listed    Component Value Date/Time   NA 141 01/11/2018 1117   NA 139 01/12/2017 1011   K 4.6 01/11/2018 1117   K 4.3 01/12/2017 1011   CL 108 01/11/2018 1117   CO2 21 (L) 01/11/2018 1117   CO2 23 01/12/2017 1011   GLUCOSE 110 (H) 01/11/2018 1117   GLUCOSE 98 01/12/2017 1011   BUN 27 (H) 01/11/2018 1117   BUN 21.8 01/12/2017 1011   CREATININE 0.85 01/11/2018 1117   CREATININE 0.9 01/12/2017 1011   CALCIUM 9.0 01/11/2018 1117   CALCIUM 9.0 01/12/2017 1011   PROT 6.6 01/11/2018 1117   PROT 6.4 01/12/2017 1011   ALBUMIN 3.8 01/11/2018 1117   ALBUMIN 3.6 01/12/2017 1011   AST 22 01/11/2018 1117   AST 20 01/12/2017 1011   ALT 22 01/11/2018 1117   ALT 18 01/12/2017 1011   ALKPHOS 86 01/11/2018 1117   ALKPHOS 81 01/12/2017 1011   BILITOT 0.7 01/11/2018 1117   BILITOT 0.58 01/12/2017 1011   GFRNONAA >60 01/11/2018 1117   GFRAA >60 01/11/2018 1117    No  results found for: SPEP, UPEP  Lab Results  Component Value Date   WBC 5.9 01/11/2018   NEUTROABS 3.3 01/11/2018   HGB 13.3 01/11/2018   HCT 39.4 01/11/2018   MCV 92.3 01/11/2018   PLT 173 01/11/2018      Chemistry      Component Value Date/Time   NA 141 01/11/2018 1117   NA 139 01/12/2017 1011   K 4.6 01/11/2018 1117   K 4.3 01/12/2017 1011   CL 108 01/11/2018 1117   CO2 21 (L) 01/11/2018 1117   CO2 23 01/12/2017 1011   BUN 27 (H) 01/11/2018 1117   BUN 21.8 01/12/2017 1011   CREATININE 0.85 01/11/2018 1117   CREATININE 0.9 01/12/2017 1011      Component Value Date/Time   CALCIUM 9.0 01/11/2018 1117   CALCIUM 9.0 01/12/2017 1011   ALKPHOS 86 01/11/2018 1117   ALKPHOS 81 01/12/2017 1011   AST 22 01/11/2018 1117   AST 20 01/12/2017  1011   ALT 22 01/11/2018 1117   ALT 18 01/12/2017 1011   BILITOT 0.7 01/11/2018 1117   BILITOT 0.58 01/12/2017 1011      All questions were answered. The patient knows to call the clinic with any problems, questions or concerns. No barriers to learning was detected.  I spent 10 minutes counseling the patient face to face. The total time spent in the appointment was 15 minutes and more than 50% was on counseling and review of test results  Heath Lark, MD 01/18/2018 12:54 PM

## 2018-01-18 NOTE — Assessment & Plan Note (Signed)
I suspect this is related to chronic alcohol intake. I recommended patient to discontinue alcohol intake but the patient does not believe that alcohol can cause worsening peripheral neuropathy but he appears motivated to try to cut back on alcohol intake He will continue taking Lyrica for this I will defer to his neurologist for further management

## 2018-01-18 NOTE — Assessment & Plan Note (Signed)
Clinically, he has no presence of disease progression. I plan to see him on a yearly basis with history, physical examination and blood work. I recommend influenza vaccination in the fall.

## 2018-01-18 NOTE — Telephone Encounter (Signed)
Gave avs and calendar ° °

## 2018-01-24 ENCOUNTER — Other Ambulatory Visit: Payer: Medicare Other

## 2018-02-02 ENCOUNTER — Ambulatory Visit
Admission: RE | Admit: 2018-02-02 | Discharge: 2018-02-02 | Disposition: A | Discharge status: Home / Self Care | Payer: Medicare Other | Source: Ambulatory Visit | Attending: Family Medicine | Admitting: Family Medicine

## 2018-02-02 DIAGNOSIS — E041 Nontoxic single thyroid nodule: Secondary | ICD-10-CM

## 2018-03-30 ENCOUNTER — Encounter: Payer: Self-pay | Admitting: Adult Health

## 2018-03-30 ENCOUNTER — Ambulatory Visit: Payer: Medicare Other | Admitting: Adult Health

## 2018-03-30 VITALS — BP 119/69 | HR 60 | Ht 73.0 in | Wt 242.6 lb

## 2018-03-30 DIAGNOSIS — G63 Polyneuropathy in diseases classified elsewhere: Secondary | ICD-10-CM | POA: Diagnosis not present

## 2018-03-30 DIAGNOSIS — G2581 Restless legs syndrome: Secondary | ICD-10-CM | POA: Diagnosis not present

## 2018-03-30 DIAGNOSIS — D472 Monoclonal gammopathy: Secondary | ICD-10-CM

## 2018-03-30 MED ORDER — PREGABALIN 200 MG PO CAPS: 200.0000 mg | ORAL_CAPSULE | Freq: Two times a day (BID) | ORAL | 5 refills | Status: DC

## 2018-03-30 NOTE — Patient Instructions (Signed)
Your Plan:  Continue Lyrica and Requip If your symptoms worsen or you develop new symptoms please let us know.   Thank you for coming to see [place] at [facility]. I hope we have been able to provide you high quality care today.  You may receive a patient satisfaction survey over the next few weeks. We would appreciate your feedback and comments so that we may continue to improve ourselves and the health of our patients.

## 2018-03-30 NOTE — Progress Notes (Signed)
I have read the note, and I agree with the clinical assessment and plan.  Charles K Willis   

## 2018-03-30 NOTE — Progress Notes (Signed)
PATIENT: Daniel Whitaker DOB: Apr 30, 1946  REASON FOR VISIT: follow up HISTORY FROM: patient  HISTORY OF PRESENT ILLNESS: Today 03/30/18:  Daniel Whitaker is a 72 year old male with a history of peripheral neuropathy and restless leg symptoms.  He returns today for follow-up.  He remains on Lyrica 200 mg twice a day.  He reports that this works fairly well for him.  He continues to have burning sensation primarily in the toes but reports that it has improved.  He states that he has had a 20 pound weight loss with using weight watchers.  He denies any significant changes with his gait or balance.  He states that he does feel that his balance is off.  Denies any falls.  He does not use any assistive device.  He does have Requip but states he only has to use the medication 2-3 times a month..  He returns today for evaluation.  HISTORY 03/29/17 Daniel Whitaker is a 72 year old male with a history of peripheral neuropathy and restless leg symptoms. He returns today for follow-up. He reports that he continues to have burning and tingling primarily in the toes. This typically occurs when he is inactive. He denies any significant changes with his gait or balance. Sometimes he is staggering however he denies any falls. He reports that he very rarely has has to use requip. Most of the time if he begins to develop restless legs he can typically go lay down and the symptoms resolve. He remains on Lyrica 200 mg twice a day. He states that he is happy with his current regimen and is not interested in changing or adjusting his dosing.  REVIEW OF SYSTEMS: Out of a complete 14 system review of symptoms, the patient complains only of the following symptoms, and all other reviewed systems are negative.  ALLERGIES: Allergies  Allergen Reactions  . Cymbalta [Duloxetine Hcl]     Drowsiness, nausea  . Penicillins     HOME MEDICATIONS: Outpatient Medications Prior to Visit  Medication Sig Dispense Refill  . aspirin  EC 81 MG tablet Take 81 mg by mouth daily.    . cetirizine (ZYRTEC) 10 MG tablet Take 10 mg by mouth daily.    . Cholecalciferol (VITAMIN D) 2000 UNITS CAPS Take 2,000 Units by mouth.    . Cyanocobalamin (VITAMIN B-12 PO) Take 1 tablet by mouth daily.    . dorzolamide-timolol (COSOPT) 22.3-6.8 MG/ML ophthalmic solution Place 1 drop into the left eye 2 (two) times daily.    Marland Kitchen ketoconazole (NIZORAL) 2 % cream     . latanoprost (XALATAN) 0.005 % ophthalmic solution     . lidocaine (XYLOCAINE) 5 % ointment Apply 1 application topically 2 (two) times daily as needed. 35.44 g 3  . LYRICA 200 MG capsule TAKE 1 CAPSULE BY MOUTH TWICE DAILY 60 capsule 5  . meloxicam (MOBIC) 15 MG tablet     . multivitamin-iron-minerals-folic acid (CENTRUM) chewable tablet Chew 1 tablet by mouth daily.    . Omega-3 Fatty Acids (FISH OIL) 1000 MG CAPS Take 1,000 mg by mouth daily. 3    . pantoprazole (PROTONIX) 40 MG tablet     . rOPINIRole (REQUIP) 1 MG tablet     . UNABLE TO FIND Med Name: Visical, HAIR VITAMIN     No facility-administered medications prior to visit.     PAST MEDICAL HISTORY: Past Medical History:  Diagnosis Date  . Erectile dysfunction   . Esophageal reflux   . Glaucoma   . Hypertriglyceridemia   .  Impaired fasting glucose   . MGUS (monoclonal gammopathy of unknown significance)   . Neuropathy associated with MGUS ([place]) 07/11/2013  . Nontoxic uninodular goiter   . Obesity   . Peripheral neuropathy   . Spinal stenosis   . Unspecified deficiency anemia 07/11/2013    PAST SURGICAL HISTORY: Past Surgical History:  Procedure Laterality Date  . COLON RESECTION N/A 04/21/2013   Procedure: DIAGNOSTIC LAPAROSCOPY, lysis of adhesions for partial small bowel obstruction;  Surgeon: Odis Hollingshead, MD;  Location: [facility];  Service: General;  Laterality: N/A;  . KNEE SURGERY    . SMALL INTESTINE SURGERY      FAMILY HISTORY: Family History  Problem Relation Age of Onset  . Brain cancer  Mother   . Lung cancer Father   . Heart disease Brother   . Dementia Brother   . Neuropathy Neg Hx     SOCIAL HISTORY: Social History   Socioeconomic History  . Marital status: Married    Spouse name: [name]  . Number of children: 2  . Years of education: GED  . Highest education level: Not on file  Occupational History  . Occupation: still working  Scientific laboratory technician  . Financial resource strain: Not on file  . Food insecurity:    Worry: Not on file    Inability: Not on file  . Transportation needs:    Medical: Not on file    Non-medical: Not on file  Tobacco Use  . Smoking status: Former Smoker    Last attempt to quit: 06/01/1984    Years since quitting: 33.8  . Smokeless tobacco: Never Used  Substance and Sexual Activity  . Alcohol use: Yes    Alcohol/week: 6.0 - 8.0 standard drinks    Types: 6 - 8 Cans of beer per week    Comment: 12 BEERS WEEKLY  . Drug use: No  . Sexual activity: Not on file  Lifestyle  . Physical activity:    Days per week: Not on file    Minutes per session: Not on file  . Stress: Not on file  Relationships  . Social connections:    Talks on phone: Not on file    Gets together: Not on file    Attends religious service: Not on file    Active member of club or organization: Not on file    Attends meetings of clubs or organizations: Not on file    Relationship status: Not on file  . Intimate partner violence:    Fear of current or ex partner: Not on file    Emotionally abused: Not on file    Physically abused: Not on file    Forced sexual activity: Not on file  Other Topics Concern  . Not on file  Social History Narrative   Patient lives at home with his wife [name])   Patient works full-time.   Patient has two children.   Patient is right-handed.   Patient has his GED.   Patient does not drink caffeine.      PHYSICAL EXAM  Vitals:   03/30/18 0908  BP: 119/69  Pulse: 60  Weight: 242 lb 9.6 oz (110 kg)  Height: 6\' 1"  (1.854 m)    Body mass index is 32.01 kg/m.  Generalized: Well developed, in no acute distress   Neurological examination  Mentation: Alert oriented to time, place, history taking. Follows all commands speech and language fluent Cranial nerve II-XII: Pupils were equal round reactive to light. Extraocular movements were full,  visual field were full on confrontational test. Facial sensation and strength were normal. Uvula tongue midline. Head turning and shoulder shrug  were normal and symmetric. Motor: The motor testing reveals 5 over 5 strength of all 4 extremities. Good symmetric motor tone is noted throughout.  Sensory: Sensory testing is intact to soft touch on all 4 extremities.  Pinprick sensation decreased in the lower extremities in a stocking-like pattern.  No evidence of extinction is noted.  Coordination: Cerebellar testing reveals good finger-nose-finger and heel-to-shin bilaterally.  Gait and station: Gait is normal. Reflexes: Deep tendon reflexes are symmetric and normal bilaterally.   DIAGNOSTIC DATA (LABS, IMAGING, TESTING) - I reviewed patient records, labs, notes, testing and imaging myself where available.  Lab Results  Component Value Date   WBC 5.9 01/11/2018   HGB 13.3 01/11/2018   HCT 39.4 01/11/2018   MCV 92.3 01/11/2018   PLT 173 01/11/2018      Component Value Date/Time   NA 141 01/11/2018 1117   NA 139 01/12/2017 1011   K 4.6 01/11/2018 1117   K 4.3 01/12/2017 1011   CL 108 01/11/2018 1117   CO2 21 (L) 01/11/2018 1117   CO2 23 01/12/2017 1011   GLUCOSE 110 (H) 01/11/2018 1117   GLUCOSE 98 01/12/2017 1011   BUN 27 (H) 01/11/2018 1117   BUN 21.8 01/12/2017 1011   CREATININE 0.85 01/11/2018 1117   CREATININE 0.9 01/12/2017 1011   CALCIUM 9.0 01/11/2018 1117   CALCIUM 9.0 01/12/2017 1011   PROT 6.6 01/11/2018 1117   PROT 6.4 01/12/2017 1011   ALBUMIN 3.8 01/11/2018 1117   ALBUMIN 3.6 01/12/2017 1011   AST 22 01/11/2018 1117   AST 20 01/12/2017 1011   ALT  22 01/11/2018 1117   ALT 18 01/12/2017 1011   ALKPHOS 86 01/11/2018 1117   ALKPHOS 81 01/12/2017 1011   BILITOT 0.7 01/11/2018 1117   BILITOT 0.58 01/12/2017 1011   GFRNONAA >60 01/11/2018 1117   GFRAA >60 01/11/2018 1117   No results found for: CHOL, HDL, LDLCALC, LDLDIRECT, TRIG, CHOLHDL Lab Results  Component Value Date   HGBA1C 5.2 04/15/2013   Lab Results  Component Value Date   VITAMINB12 390 07/13/2013   Lab Results  Component Value Date   TSH 1.195 04/16/2013      ASSESSMENT AND PLAN 72 y.o. year old male  has a past medical history of Erectile dysfunction, Esophageal reflux, Glaucoma, Hypertriglyceridemia, Impaired fasting glucose, MGUS (monoclonal gammopathy of unknown significance), Neuropathy associated with MGUS ([place]) (07/11/2013), Nontoxic uninodular goiter, Obesity, Peripheral neuropathy, Spinal stenosis, and Unspecified deficiency anemia (07/11/2013). here with:  1.  Neuropathy 2.  Restless leg syndrome  Overall patient has remained stable.  He will continue on Lyrica 200 mg twice a day.  He has Requip but rarely has to use his medication.  He is advised that if his symptoms worsen or he develop new symptoms he should let us know.  He will follow-up in 6 months or sooner if needed.    Ward Givens, MSN, NP-C 03/30/2018, 9:15 AM [facility] [address] [place], [place] [address] [phone]  ;

## 2018-10-18 ENCOUNTER — Other Ambulatory Visit: Payer: Self-pay | Admitting: Adult Health

## 2018-11-16 ENCOUNTER — Other Ambulatory Visit: Payer: Self-pay | Admitting: Adult Health

## 2018-11-17 ENCOUNTER — Telehealth: Payer: Self-pay | Admitting: Adult Health

## 2018-11-17 MED ORDER — PREGABALIN 200 MG PO CAPS: 200.0000 mg | ORAL_CAPSULE | Freq: Two times a day (BID) | ORAL | 5 refills | Status: DC

## 2018-11-17 NOTE — Addendum Note (Signed)
Addended by: Thomes Cake on: 11/17/2018 10:16 AM   Modules accepted: Orders

## 2018-11-17 NOTE — Telephone Encounter (Signed)
Prescription has been signed and faxed to the patient's pharmacy. Confirmation fax has been received.

## 2018-11-17 NOTE — Telephone Encounter (Signed)
Pt has checked with his pharmacy and they do not have the refill from NP [facility] yet for his pregabalin (LYRICA) 200 MG capsule, please resend [place]

## 2019-01-12 ENCOUNTER — Inpatient Hospital Stay: Payer: Medicare Other | Attending: Hematology and Oncology

## 2019-01-12 ENCOUNTER — Other Ambulatory Visit: Payer: Self-pay

## 2019-01-12 ENCOUNTER — Other Ambulatory Visit: Payer: Medicare Other

## 2019-01-12 DIAGNOSIS — Z7982 Long term (current) use of aspirin: Secondary | ICD-10-CM | POA: Insufficient documentation

## 2019-01-12 DIAGNOSIS — G629 Polyneuropathy, unspecified: Secondary | ICD-10-CM | POA: Diagnosis not present

## 2019-01-12 DIAGNOSIS — Z79899 Other long term (current) drug therapy: Secondary | ICD-10-CM | POA: Insufficient documentation

## 2019-01-12 DIAGNOSIS — Z791 Long term (current) use of non-steroidal anti-inflammatories (NSAID): Secondary | ICD-10-CM | POA: Insufficient documentation

## 2019-01-12 DIAGNOSIS — D472 Monoclonal gammopathy: Secondary | ICD-10-CM | POA: Diagnosis not present

## 2019-01-12 LAB — CBC WITH DIFFERENTIAL/PLATELET
Abs Immature Granulocytes: 0.01 10*3/uL (ref 0.00–0.07)
Basophils Absolute: 0.1 10*3/uL (ref 0.0–0.1)
Basophils Relative: 1 %
Eosinophils Absolute: 0.3 10*3/uL (ref 0.0–0.5)
Eosinophils Relative: 5 %
HCT: 38.6 % — ABNORMAL LOW (ref 39.0–52.0)
Hemoglobin: 13.3 g/dL (ref 13.0–17.0)
Immature Granulocytes: 0 %
Lymphocytes Relative: 32 %
Lymphs Abs: 1.7 10*3/uL (ref 0.7–4.0)
MCH: 32.1 pg (ref 26.0–34.0)
MCHC: 34.5 g/dL (ref 30.0–36.0)
MCV: 93.2 fL (ref 80.0–100.0)
Monocytes Absolute: 0.5 10*3/uL (ref 0.1–1.0)
Monocytes Relative: 9 %
Neutro Abs: 2.8 10*3/uL (ref 1.7–7.7)
Neutrophils Relative %: 53 %
Platelets: 171 10*3/uL (ref 150–400)
RBC: 4.14 MIL/uL — ABNORMAL LOW (ref 4.22–5.81)
RDW: 12.4 % (ref 11.5–15.5)
WBC: 5.2 10*3/uL (ref 4.0–10.5)
nRBC: 0 % (ref 0.0–0.2)

## 2019-01-12 LAB — COMPREHENSIVE METABOLIC PANEL
ALT: 19 U/L (ref 0–44)
AST: 19 U/L (ref 15–41)
Albumin: 3.7 g/dL (ref 3.5–5.0)
Alkaline Phosphatase: 86 U/L (ref 38–126)
Anion gap: 9 (ref 5–15)
BUN: 18 mg/dL (ref 8–23)
CO2: 23 mmol/L (ref 22–32)
Calcium: 9.1 mg/dL (ref 8.9–10.3)
Chloride: 109 mmol/L (ref 98–111)
Creatinine, Ser: 0.95 mg/dL (ref 0.61–1.24)
GFR calc Af Amer: >60 mL/min (ref >60)
GFR calc non Af Amer: >60 mL/min (ref >60)
Glucose, Bld: 112 mg/dL — ABNORMAL HIGH (ref 70–99)
Potassium: 4.1 mmol/L (ref 3.5–5.1)
Sodium: 141 mmol/L (ref 135–145)
Total Bilirubin: 0.6 mg/dL (ref 0.3–1.2)
Total Protein: 6.5 g/dL (ref 6.5–8.1)

## 2019-01-13 LAB — KAPPA/LAMBDA LIGHT CHAINS
Kappa free light chain: 20 mg/L — ABNORMAL HIGH (ref 3.3–19.4)
Kappa, lambda light chain ratio: 2.3 — ABNORMAL HIGH (ref 0.26–1.65)
Lambda free light chains: 8.7 mg/L (ref 5.7–26.3)

## 2019-01-15 LAB — MULTIPLE MYELOMA PANEL, SERUM
Albumin SerPl Elph-Mcnc: 3.6 g/dL (ref 2.9–4.4)
Albumin/Glob SerPl: 1.6 (ref 0.7–1.7)
Alpha 1: 0.2 g/dL (ref 0.0–0.4)
Alpha2 Glob SerPl Elph-Mcnc: 0.6 g/dL (ref 0.4–1.0)
B-Globulin SerPl Elph-Mcnc: 0.7 g/dL (ref 0.7–1.3)
Gamma Glob SerPl Elph-Mcnc: 0.9 g/dL (ref 0.4–1.8)
Globulin, Total: 2.4 g/dL (ref 2.2–3.9)
IgA: 387 mg/dL (ref 61–437)
IgG (Immunoglobin G), Serum: 704 mg/dL (ref 603–1613)
IgM (Immunoglobulin M), Srm: 54 mg/dL (ref 15–143)
M Protein SerPl Elph-Mcnc: 0.2 g/dL — ABNORMAL HIGH
Total Protein ELP: 6 g/dL (ref 6.0–8.5)

## 2019-01-19 ENCOUNTER — Other Ambulatory Visit: Payer: Self-pay

## 2019-01-19 ENCOUNTER — Inpatient Hospital Stay: Payer: Medicare Other | Admitting: Hematology and Oncology

## 2019-01-19 DIAGNOSIS — D472 Monoclonal gammopathy: Secondary | ICD-10-CM

## 2019-01-19 DIAGNOSIS — G629 Polyneuropathy, unspecified: Secondary | ICD-10-CM

## 2019-01-20 ENCOUNTER — Encounter: Payer: Self-pay | Admitting: Hematology and Oncology

## 2019-01-20 NOTE — Progress Notes (Signed)
[facility] OFFICE PROGRESS NOTE  Patient Care Team: [name], MD as PCP - General (Family Medicine)  ASSESSMENT & PLAN:  MGUS (monoclonal gammopathy of unknown significance) Clinically, he has no presence of disease progression. I plan to see him on a yearly basis with history, physical examination and blood work. I recommend influenza vaccination in the fall.  Neuropathy I suspect this is related to chronic alcohol intake. I recommended patient to discontinue alcohol intake but the patient does not believe that alcohol can cause worsening peripheral neuropathy but he continues to drink 2 alcoholic beverages per day He will continue taking Lyrica for this I will defer to his neurologist for further management    Orders Placed This Encounter  Procedures  . CBC with Differential/Platelet    Standing Status:   Future    Standing Expiration Date:   02/24/2020  . Comprehensive metabolic panel    Standing Status:   Future    Standing Expiration Date:   02/24/2020  . Kappa/lambda light chains    Standing Status:   Future    Standing Expiration Date:   02/24/2020  . Multiple Myeloma Panel (SPEP&IFE w/QIG)    Standing Status:   Future    Standing Expiration Date:   02/24/2020    INTERVAL HISTORY: Please see below for problem oriented charting. He returns for further follow-up He continues to have significant neuropathy affecting his feet He continues to drink 2 alcoholic beverages per day No recent new bone pain No recent infection, fever or chills  SUMMARY OF ONCOLOGIC HISTORY:  This is a pleasant patient with severe peripheral neuropathy was referred here for evaluation of abnormal serum protein electrophoresis. Further studies revealed monoclonal gammopathy of unknown significance that is not the cause of his peripheral neuropathy, IgA kappa subtype. The peripheral neuropathy was thought to be related to alcoholism. He is being observed.  REVIEW OF SYSTEMS:    Constitutional: Denies fevers, chills or abnormal weight loss Eyes: Denies blurriness of vision Ears, nose, mouth, throat, and face: Denies mucositis or sore throat Respiratory: Denies cough, dyspnea or wheezes Cardiovascular: Denies palpitation, chest discomfort or lower extremity swelling Gastrointestinal:  Denies nausea, heartburn or change in bowel habits Skin: Denies abnormal skin rashes Lymphatics: Denies new lymphadenopathy or easy bruising Neurological:Denies numbness, tingling or new weaknesses Behavioral/Psych: Mood is stable, no new changes  All other systems were reviewed with the patient and are negative.  I have reviewed the past medical history, past surgical history, social history and family history with the patient and they are unchanged from previous note.  ALLERGIES:  is allergic to cymbalta [duloxetine hcl] and penicillins.  MEDICATIONS:  Current Outpatient Medications  Medication Sig Dispense Refill  . aspirin EC 81 MG tablet Take 81 mg by mouth daily.    . cetirizine (ZYRTEC) 10 MG tablet Take 10 mg by mouth daily.    . Cholecalciferol (VITAMIN D) 2000 UNITS CAPS Take 2,000 Units by mouth.    . Cyanocobalamin (VITAMIN B-12 PO) Take 1 tablet by mouth daily.    . dorzolamide-timolol (COSOPT) 22.3-6.8 MG/ML ophthalmic solution Place 1 drop into the left eye 2 (two) times daily.    Marland Kitchen ketoconazole (NIZORAL) 2 % cream     . latanoprost (XALATAN) 0.005 % ophthalmic solution     . lidocaine (XYLOCAINE) 5 % ointment Apply 1 application topically 2 (two) times daily as needed. 35.44 g 3  . meloxicam (MOBIC) 15 MG tablet     . multivitamin-iron-minerals-folic acid (CENTRUM)  chewable tablet Chew 1 tablet by mouth daily.    . Omega-3 Fatty Acids (FISH OIL) 1000 MG CAPS Take 1,000 mg by mouth daily. 3    . pantoprazole (PROTONIX) 40 MG tablet     . pregabalin (LYRICA) 200 MG capsule Take 1 capsule (200 mg total) by mouth 2 (two) times daily. 60 capsule 5  . rOPINIRole  (REQUIP) 1 MG tablet     . UNABLE TO FIND Med Name: Visical, HAIR VITAMIN     No current facility-administered medications for this visit.     PHYSICAL EXAMINATION: ECOG PERFORMANCE STATUS: 1 - Symptomatic but completely ambulatory  Vitals:   01/19/19 1017  BP: (!) 144/78  Pulse: 67  Resp: 18  Temp: 98.7 F (37.1 C)  SpO2: 97%   Filed Weights   01/19/19 1017  Weight: 252 lb 12.8 oz (114.7 kg)    GENERAL:alert, no distress and comfortable SKIN: skin color, texture, turgor are normal, no rashes or significant lesions EYES: normal, Conjunctiva are pink and non-injected, sclera clear OROPHARYNX:no exudate, no erythema and lips, buccal mucosa, and tongue normal  NECK: supple, thyroid normal size, non-tender, without nodularity LYMPH:  no palpable lymphadenopathy in the cervical, axillary or inguinal LUNGS: clear to auscultation and percussion with normal breathing effort HEART: regular rate & rhythm and no murmurs and no lower extremity edema ABDOMEN:abdomen soft, non-tender and normal bowel sounds Musculoskeletal:no cyanosis of digits and no clubbing  NEURO: alert & oriented x 3 with fluent speech, no focal motor/sensory deficits  LABORATORY DATA:  I have reviewed the data as listed    Component Value Date/Time   NA 141 01/12/2019 0845   NA 139 01/12/2017 1011   K 4.1 01/12/2019 0845   K 4.3 01/12/2017 1011   CL 109 01/12/2019 0845   CO2 23 01/12/2019 0845   CO2 23 01/12/2017 1011   GLUCOSE 112 (H) 01/12/2019 0845   GLUCOSE 98 01/12/2017 1011   BUN 18 01/12/2019 0845   BUN 21.8 01/12/2017 1011   CREATININE 0.95 01/12/2019 0845   CREATININE 0.9 01/12/2017 1011   CALCIUM 9.1 01/12/2019 0845   CALCIUM 9.0 01/12/2017 1011   PROT 6.5 01/12/2019 0845   PROT 6.4 01/12/2017 1011   ALBUMIN 3.7 01/12/2019 0845   ALBUMIN 3.6 01/12/2017 1011   AST 19 01/12/2019 0845   AST 20 01/12/2017 1011   ALT 19 01/12/2019 0845   ALT 18 01/12/2017 1011   ALKPHOS 86 01/12/2019 0845    ALKPHOS 81 01/12/2017 1011   BILITOT 0.6 01/12/2019 0845   BILITOT 0.58 01/12/2017 1011   GFRNONAA >60 01/12/2019 0845   GFRAA >60 01/12/2019 0845    No results found for: SPEP, UPEP  Lab Results  Component Value Date   WBC 5.2 01/12/2019   NEUTROABS 2.8 01/12/2019   HGB 13.3 01/12/2019   HCT 38.6 (L) 01/12/2019   MCV 93.2 01/12/2019   PLT 171 01/12/2019      Chemistry      Component Value Date/Time   NA 141 01/12/2019 0845   NA 139 01/12/2017 1011   K 4.1 01/12/2019 0845   K 4.3 01/12/2017 1011   CL 109 01/12/2019 0845   CO2 23 01/12/2019 0845   CO2 23 01/12/2017 1011   BUN 18 01/12/2019 0845   BUN 21.8 01/12/2017 1011   CREATININE 0.95 01/12/2019 0845   CREATININE 0.9 01/12/2017 1011      Component Value Date/Time   CALCIUM 9.1 01/12/2019 0845   CALCIUM 9.0 01/12/2017 1011  ALKPHOS 86 01/12/2019 0845   ALKPHOS 81 01/12/2017 1011   AST 19 01/12/2019 0845   AST 20 01/12/2017 1011   ALT 19 01/12/2019 0845   ALT 18 01/12/2017 1011   BILITOT 0.6 01/12/2019 0845   BILITOT 0.58 01/12/2017 1011      All questions were answered. The patient knows to call the clinic with any problems, questions or concerns. No barriers to learning was detected.  I spent 10 minutes counseling the patient face to face. The total time spent in the appointment was 15 minutes and more than 50% was on counseling and review of test results  Heath Lark, MD 01/20/2019 11:32 AM

## 2019-01-20 NOTE — Assessment & Plan Note (Signed)
Clinically, he has no presence of disease progression. I plan to see him on a yearly basis with history, physical examination and blood work. I recommend influenza vaccination in the fall.

## 2019-01-20 NOTE — Assessment & Plan Note (Signed)
I suspect this is related to chronic alcohol intake. I recommended patient to discontinue alcohol intake but the patient does not believe that alcohol can cause worsening peripheral neuropathy but he continues to drink 2 alcoholic beverages per day He will continue taking Lyrica for this I will defer to his neurologist for further management

## 2019-01-25 ENCOUNTER — Telehealth: Payer: Self-pay | Admitting: Hematology and Oncology

## 2019-01-25 NOTE — Telephone Encounter (Signed)
I talk with patient regarding schedule  

## 2019-01-26 ENCOUNTER — Other Ambulatory Visit: Payer: Self-pay | Admitting: Family Medicine

## 2019-01-26 DIAGNOSIS — R42 Dizziness and giddiness: Secondary | ICD-10-CM

## 2019-01-26 DIAGNOSIS — R519 Headache, unspecified: Secondary | ICD-10-CM

## 2019-02-23 ENCOUNTER — Other Ambulatory Visit: Payer: Medicare Other

## 2019-03-09 ENCOUNTER — Other Ambulatory Visit: Payer: Self-pay | Admitting: Family Medicine

## 2019-03-09 DIAGNOSIS — E041 Nontoxic single thyroid nodule: Secondary | ICD-10-CM

## 2019-03-16 ENCOUNTER — Ambulatory Visit
Admission: RE | Admit: 2019-03-16 | Discharge: 2019-03-16 | Disposition: A | Discharge status: Home / Self Care | Payer: Medicare Other | Source: Ambulatory Visit | Attending: Family Medicine | Admitting: Family Medicine

## 2019-03-16 DIAGNOSIS — E041 Nontoxic single thyroid nodule: Secondary | ICD-10-CM

## 2019-04-05 ENCOUNTER — Other Ambulatory Visit: Payer: Self-pay

## 2019-04-05 ENCOUNTER — Ambulatory Visit: Payer: Medicare Other | Admitting: Adult Health

## 2019-04-05 ENCOUNTER — Encounter: Payer: Self-pay | Admitting: Adult Health

## 2019-04-05 VITALS — BP 150/86 | HR 59 | Temp 98.2°F | Ht 73.0 in | Wt 251.6 lb

## 2019-04-05 DIAGNOSIS — D472 Monoclonal gammopathy: Secondary | ICD-10-CM

## 2019-04-05 DIAGNOSIS — G2581 Restless legs syndrome: Secondary | ICD-10-CM

## 2019-04-05 DIAGNOSIS — G63 Polyneuropathy in diseases classified elsewhere: Secondary | ICD-10-CM | POA: Diagnosis not present

## 2019-04-05 NOTE — Progress Notes (Signed)
PATIENT: Daniel Whitaker DOB: [birth year]  REASON FOR VISIT: follow up HISTORY FROM: patient  HISTORY OF PRESENT ILLNESS: Today 04/05/19:  Mr. Lindner is a 73 year old male with a history of peripheral neuropathy and restless leg symptoms.  He returns today for follow-up.  He continues on Lyrica 200 mg twice a day.  Overall he states that he is doing fairly well.  When he is resting or at bedtime is when he has burning in the feet.  He reports that he has a spot on the right foot that always burns.  He does have lidocaine cream at home but has not been using it.  He denies any changes with his gait or balance.  He returns today for an evaluation.  HISTORY 03/30/18:  Mr. Vinton is a 73 year old male with a history of peripheral neuropathy and restless leg symptoms.  He returns today for follow-up.  He remains on Lyrica 200 mg twice a day.  He reports that this works fairly well for him.  He continues to have burning sensation primarily in the toes but reports that it has improved.  He states that he has had a 20 pound weight loss with using weight watchers.  He denies any significant changes with his gait or balance.  He states that he does feel that his balance is off.  Denies any falls.  He does not use any assistive device.  He does have Requip but states he only has to use the medication 2-3 times a month..  He returns today for evaluation.  REVIEW OF SYSTEMS: Out of a complete 14 system review of symptoms, the patient complains only of the following symptoms, and all other reviewed systems are negative.  See HPI  ALLERGIES: Allergies  Allergen Reactions  . Cymbalta [Duloxetine Hcl]     Drowsiness, nausea  . Penicillins     HOME MEDICATIONS: Outpatient Medications Prior to Visit  Medication Sig Dispense Refill  . aspirin EC 81 MG tablet Take 81 mg by mouth daily.    . cetirizine (ZYRTEC) 10 MG tablet Take 10 mg by mouth daily.    . Cholecalciferol (VITAMIN D) 2000 UNITS CAPS  Take 2,000 Units by mouth.    . Cyanocobalamin (VITAMIN B-12 PO) Take 2 tablets by mouth daily.     . dorzolamide-timolol (COSOPT) 22.3-6.8 MG/ML ophthalmic solution Place 1 drop into the left eye 2 (two) times daily.    Marland Kitchen ketoconazole (NIZORAL) 2 % cream     . latanoprost (XALATAN) 0.005 % ophthalmic solution     . lidocaine (XYLOCAINE) 5 % ointment Apply 1 application topically 2 (two) times daily as needed. 35.44 g 3  . meloxicam (MOBIC) 15 MG tablet     . multivitamin-iron-minerals-folic acid (CENTRUM) chewable tablet Chew 1 tablet by mouth daily.    . Omega-3 Fatty Acids (FISH OIL) 1000 MG CAPS Take 1,000 mg by mouth daily. 3    . pantoprazole (PROTONIX) 40 MG tablet     . pregabalin (LYRICA) 200 MG capsule Take 1 capsule (200 mg total) by mouth 2 (two) times daily. 60 capsule 5  . rOPINIRole (REQUIP) 1 MG tablet     . UNABLE TO FIND Med Name: Visical, HAIR VITAMIN     No facility-administered medications prior to visit.     PAST MEDICAL HISTORY: Past Medical History:  Diagnosis Date  . Erectile dysfunction   . Esophageal reflux   . Glaucoma   . Hypertriglyceridemia   . Impaired fasting glucose   .  MGUS (monoclonal gammopathy of unknown significance)   . Neuropathy associated with MGUS ([place]) 07/11/2013  . Nontoxic uninodular goiter   . Obesity   . Peripheral neuropathy   . Spinal stenosis   . Unspecified deficiency anemia 07/11/2013    PAST SURGICAL HISTORY: Past Surgical History:  Procedure Laterality Date  . COLON RESECTION N/A 04/21/2013   Procedure: DIAGNOSTIC LAPAROSCOPY, lysis of adhesions for partial small bowel obstruction;  Surgeon: Odis Hollingshead, MD;  Location: [facility];  Service: General;  Laterality: N/A;  . KNEE SURGERY    . SMALL INTESTINE SURGERY      FAMILY HISTORY: Family History  Problem Relation Age of Onset  . Brain cancer Mother   . Lung cancer Father   . Heart disease Brother   . Dementia Brother   . Neuropathy Neg Hx     SOCIAL HISTORY:  Social History   Socioeconomic History  . Marital status: Married    Spouse name: [name]  . Number of children: 2  . Years of education: GED  . Highest education level: Not on file  Occupational History  . Occupation: still working  Scientific laboratory technician  . Financial resource strain: Not on file  . Food insecurity    Worry: Not on file    Inability: Not on file  . Transportation needs    Medical: Not on file    Non-medical: Not on file  Tobacco Use  . Smoking status: Former Smoker    Quit date: 06/01/1984    Years since quitting: 34.8  . Smokeless tobacco: Never Used  Substance and Sexual Activity  . Alcohol use: Yes    Alcohol/week: 6.0 - 8.0 standard drinks    Types: 6 - 8 Cans of beer per week    Comment: 12 BEERS WEEKLY  . Drug use: No  . Sexual activity: Not on file  Lifestyle  . Physical activity    Days per week: Not on file    Minutes per session: Not on file  . Stress: Not on file  Relationships  . Social Herbalist on phone: Not on file    Gets together: Not on file    Attends religious service: Not on file    Active member of club or organization: Not on file    Attends meetings of clubs or organizations: Not on file    Relationship status: Not on file  . Intimate partner violence    Fear of current or ex partner: Not on file    Emotionally abused: Not on file    Physically abused: Not on file    Forced sexual activity: Not on file  Other Topics Concern  . Not on file  Social History Narrative   Patient lives at home with his wife [name])   Patient works full-time.   Patient has two children.   Patient is right-handed.   Patient has his GED.   Patient does not drink caffeine.      PHYSICAL EXAM  Vitals:   04/05/19 1011  BP: (!) 150/86  Pulse: (!) 59  Temp: 98.2 F (36.8 C)  TempSrc: Oral  Weight: 251 lb 9.6 oz (114.1 kg)  Height: 6\' 1"  (1.854 m)   Body mass index is 33.19 kg/m.  Generalized: Well developed, in no acute distress    Neurological examination  Mentation: Alert oriented to time, place, history taking. Follows all commands speech and language fluent Cranial nerve II-XII: Pupils were equal round reactive to light. Extraocular  movements were full, visual field were full on confrontational test.  Head turning and shoulder shrug  were normal and symmetric. Motor: The motor testing reveals 5 over 5 strength of all 4 extremities. Good symmetric motor tone is noted throughout.  Sensory: Sensory testing is intact to soft touch on all 4 extremities. No evidence of extinction is noted.  Coordination: Cerebellar testing reveals good finger-nose-finger and heel-to-shin bilaterally.  Gait and station: Gait is normal. Tandem gait is normal. Romberg is negative. No drift is seen.  Reflexes: Deep tendon reflexes are symmetric and normal bilaterally.   DIAGNOSTIC DATA (LABS, IMAGING, TESTING) - I reviewed patient records, labs, notes, testing and imaging myself where available.  Lab Results  Component Value Date   WBC 5.2 01/12/2019   HGB 13.3 01/12/2019   HCT 38.6 (L) 01/12/2019   MCV 93.2 01/12/2019   PLT 171 01/12/2019      Component Value Date/Time   NA 141 01/12/2019 0845   NA 139 01/12/2017 1011   K 4.1 01/12/2019 0845   K 4.3 01/12/2017 1011   CL 109 01/12/2019 0845   CO2 23 01/12/2019 0845   CO2 23 01/12/2017 1011   GLUCOSE 112 (H) 01/12/2019 0845   GLUCOSE 98 01/12/2017 1011   BUN 18 01/12/2019 0845   BUN 21.8 01/12/2017 1011   CREATININE 0.95 01/12/2019 0845   CREATININE 0.9 01/12/2017 1011   CALCIUM 9.1 01/12/2019 0845   CALCIUM 9.0 01/12/2017 1011   PROT 6.5 01/12/2019 0845   PROT 6.4 01/12/2017 1011   ALBUMIN 3.7 01/12/2019 0845   ALBUMIN 3.6 01/12/2017 1011   AST 19 01/12/2019 0845   AST 20 01/12/2017 1011   ALT 19 01/12/2019 0845   ALT 18 01/12/2017 1011   ALKPHOS 86 01/12/2019 0845   ALKPHOS 81 01/12/2017 1011   BILITOT 0.6 01/12/2019 0845   BILITOT 0.58 01/12/2017 1011   GFRNONAA  >60 01/12/2019 0845   GFRAA >60 01/12/2019 0845    Lab Results  Component Value Date   HGBA1C 5.2 04/15/2013   Lab Results  Component Value Date   [id number] 07/13/2013   Lab Results  Component Value Date   TSH 1.195 04/16/2013      ASSESSMENT AND PLAN 73 y.o. year old male  has a past medical history of Erectile dysfunction, Esophageal reflux, Glaucoma, Hypertriglyceridemia, Impaired fasting glucose, MGUS (monoclonal gammopathy of unknown significance), Neuropathy associated with MGUS ([place]) (07/11/2013), Nontoxic uninodular goiter, Obesity, Peripheral neuropathy, Spinal stenosis, and Unspecified deficiency anemia (07/11/2013). here with:  1.  Peripheral neuropathy 2.  Restless leg syndrome  The patient will continue on Lyrica 200 mg twice a day.  He is encouraged to use the lidocaine cream.  He is advised that if his symptoms worsen or he develops new symptoms he should let us know.  He will follow-up in 1 year or sooner if needed.    Ward Givens, MSN, NP-C 04/05/2019, 10:30 AM [facility] [address] [place], [place] [address] [phone]

## 2019-04-05 NOTE — Patient Instructions (Signed)
Your Plan:  Continue Lyrica  Use Lidocaine cream on legs  If your symptoms worsen or you develop new symptoms please let us know.    Thank you for coming to see [place] at [facility]. I hope we have been able to provide you high quality care today.  You may receive a patient satisfaction survey over the next few weeks. We would appreciate your feedback and comments so that we may continue to improve ourselves and the health of our patients.

## 2019-04-08 NOTE — Progress Notes (Signed)
I have read the note, and I agree with the clinical assessment and plan.  Tory Mckissack K Laterica Matarazzo   

## 2019-05-20 ENCOUNTER — Other Ambulatory Visit: Payer: Self-pay | Admitting: Adult Health

## 2019-08-24 ENCOUNTER — Other Ambulatory Visit: Payer: Self-pay | Admitting: Family Medicine

## 2019-08-24 ENCOUNTER — Ambulatory Visit
Admission: RE | Admit: 2019-08-24 | Discharge: 2019-08-24 | Disposition: A | Discharge status: Home / Self Care | Payer: Medicare Other | Source: Ambulatory Visit | Attending: Family Medicine | Admitting: Family Medicine

## 2019-08-24 DIAGNOSIS — R0600 Dyspnea, unspecified: Secondary | ICD-10-CM

## 2019-09-20 NOTE — Progress Notes (Signed)
Cardiology Office Note   Date:  09/21/2019   ID:  Daniel Whitaker, DOB October 31, 1945, MRN [id number]  PCP:  Daniel Neer, MD  Cardiologist:   No primary care provider on file. Referring:  Daniel Neer, MD  Chief Complaint  Patient presents with  . Shortness of Breath      History of Present Illness: Daniel Whitaker is a 74 y.o. male who is referred by Daniel Neer, MD for evaluation of DOE.  He has no past cardiac history.  He says it probably for 6 months and at least last 3 to 4 months he has had increased dyspnea with exertion.  He notices this walking up a flight of stairs.  He has to stop at the top to rest.  He says his heart will be beating fast when that happens.  He says he gets more short of breath when doing yard work.  He does describe shortness of breath bending over.  He has had about a 20 pound weight gain but he is not describing lower extremity swelling.  He is not describing resting shortness of breath, PND or orthopnea.  He has had no chest pressure, neck or arm discomfort.  He has had no palpitations, presyncope or syncope.  He has had blood work to include normal thyroid.  Is not anemic.  Electrolytes were unremarkable.  Chest x-ray was done in March and there is no disease.  Past Medical History:  Diagnosis Date  . Erectile dysfunction   . Esophageal reflux   . Glaucoma   . Hypertriglyceridemia   . Impaired fasting glucose   . MGUS (monoclonal gammopathy of unknown significance)   . Neuropathy associated with MGUS ([place]) 07/11/2013  . Nontoxic uninodular goiter   . Obesity   . Peripheral neuropathy   . Spinal stenosis   . Unspecified deficiency anemia 07/11/2013    Past Surgical History:  Procedure Laterality Date  . COLON RESECTION N/A 04/21/2013   Procedure: DIAGNOSTIC LAPAROSCOPY, lysis of adhesions for partial small bowel obstruction;  Surgeon: Odis Hollingshead, MD;  Location: [facility];  Service: General;  Laterality: N/A;  . KNEE SURGERY      Arthroscopic  . SMALL INTESTINE SURGERY     Blockage     Current Outpatient Medications  Medication Sig Dispense Refill  . albuterol (VENTOLIN HFA) 108 (90 Base) MCG/ACT inhaler Inhale 1-2 puffs into the lungs every 6 (six) hours as needed for wheezing or shortness of breath.    Marland Kitchen aspirin EC 81 MG tablet Take 81 mg by mouth daily.    . cetirizine (ZYRTEC) 10 MG tablet Take 10 mg by mouth daily.    . Cholecalciferol (VITAMIN D) 2000 UNITS CAPS Take 2,000 Units by mouth.    . Cyanocobalamin (VITAMIN B-12 PO) Take 2 tablets by mouth daily.     . dorzolamide-timolol (COSOPT) 22.3-6.8 MG/ML ophthalmic solution Place 1 drop into the left eye 2 (two) times daily.    Marland Kitchen ketoconazole (NIZORAL) 2 % cream     . latanoprost (XALATAN) 0.005 % ophthalmic solution     . lidocaine (XYLOCAINE) 5 % ointment Apply 1 application topically 2 (two) times daily as needed. 35.44 g 3  . meloxicam (MOBIC) 15 MG tablet Take 15 mg by mouth daily.     . multivitamin-iron-minerals-folic acid (CENTRUM) chewable tablet Chew 1 tablet by mouth daily.    . Omega-3 Fatty Acids (FISH OIL) 1000 MG CAPS Take 1,000 mg by mouth daily. 3    .  pantoprazole (PROTONIX) 40 MG tablet Take 40 mg by mouth daily.     . pregabalin (LYRICA) 200 MG capsule TAKE 1 CAPSULE BY MOUTH TWICE DAILY 60 capsule 5  . rOPINIRole (REQUIP) 1 MG tablet Take 1 mg by mouth at bedtime.     . sildenafil (REVATIO) 20 MG tablet Take 20-100 mg by mouth as needed.     No current facility-administered medications for this visit.    Allergies:   Cymbalta [duloxetine hcl] and Penicillins    Social History:  The patient  reports that he quit smoking about 35 years ago. He has never used smokeless tobacco. He reports current alcohol use of about 6.0 - 8.0 standard drinks of alcohol per week. He reports that he does not use drugs.   Family History:  The patient's family history includes Brain cancer in his mother; Dementia in his brother; Heart disease in his  brother; Lung cancer in his father.    ROS:  Please see the history of present illness.   Otherwise, review of systems are positive for none.   All other systems are reviewed and negative.    PHYSICAL EXAM: VS:  BP 132/72   Pulse 64   Ht 6\' 1"  (1.854 m)   Wt 255 lb (115.7 kg)   SpO2 97%   BMI 33.64 kg/m  , BMI Body mass index is 33.64 kg/m. GENERAL:  Well appearing HEENT:  Pupils equal round and reactive, fundi not visualized, oral mucosa unremarkable NECK:  No jugular venous distention, waveform within normal limits, carotid upstroke brisk and symmetric, no bruits, no thyromegaly LYMPHATICS:  No cervical, inguinal adenopathy LUNGS:  Clear to auscultation bilaterally BACK:  No CVA tenderness CHEST:  Unremarkable HEART:  PMI not displaced or sustained,S1 and S2 within normal limits, no S3, no S4, no clicks, no rubs, no murmurs ABD:  Flat, positive bowel sounds normal in frequency in pitch, no bruits, no rebound, no guarding, no midline pulsatile mass, no hepatomegaly, no splenomegaly EXT:  2 plus pulses throughout, no edema, no cyanosis no clubbing SKIN:  No rashes no nodules NEURO:  Cranial nerves II through XII grossly intact, motor grossly intact throughout PSYCH:  Cognitively intact, oriented to person place and time    EKG:  EKG is ordered today. The ekg ordered today demonstrates sinus rhythm, rate 59, axis within normal limits, intervals within normal limits, no acute ST-T wave changes   Recent Labs: 01/12/2019: ALT 19; BUN 18; Creatinine, Ser 0.95; Hemoglobin 13.3; Platelets 171; Potassium 4.1; Sodium 141    Lipid Panel No results found for: CHOL, TRIG, HDL, CHOLHDL, VLDL, LDLCALC, LDLDIRECT    Wt Readings from Last 3 Encounters:  09/21/19 255 lb (115.7 kg)  04/05/19 251 lb 9.6 oz (114.1 kg)  01/19/19 252 lb 12.8 oz (114.7 kg)      Other studies Reviewed: Additional studies/ records that were reviewed today include: Outside records from PCP. Review of the  above records demonstrates:  Please see elsewhere in the note.     ASSESSMENT AND PLAN:  DOE: Etiology of this is not entirely clear.  I am going to start with an echocardiogram and BNP level.  I do note that he has carpal tunnel syndrome and neuropathy although I am not strongly suspecting that I will find significant LVH or evidence of amyloid.  I would from his exam expect valvular heart disease.  I be surprised if he has a reduced ejection fraction.  If echo turns out to be okay and  BNP is not elevated I will image his coronaries with coronary CTA.  The next testing will be pulmonary function testing.  Another possible etiology is weight and deconditioning and he and I talked briefly about that as a diagnosis of exclusion.  OBESITY: He will be counseled on weight loss  COVID EDUCATION:  He has vaccine hesitancy but will reconsider.  Current medicines are reviewed at length with the patient today.  The patient does not have concerns regarding medicines.  The following changes have been made:  no change  Labs/ tests ordered today include:   Orders Placed This Encounter  Procedures  . Brain natriuretic peptide  . EKG 12-Lead  . ECHOCARDIOGRAM COMPLETE     Disposition:   FU with me after the above studies.     Signed, Minus Breeding, MD  09/21/2019 11:37 AM    [facility]

## 2019-09-21 ENCOUNTER — Other Ambulatory Visit: Payer: Self-pay

## 2019-09-21 ENCOUNTER — Encounter: Payer: Self-pay | Admitting: Cardiology

## 2019-09-21 ENCOUNTER — Ambulatory Visit: Payer: Medicare Other | Admitting: Cardiology

## 2019-09-21 VITALS — BP 132/72 | HR 64 | Ht 73.0 in | Wt 255.0 lb

## 2019-09-21 DIAGNOSIS — R0602 Shortness of breath: Secondary | ICD-10-CM | POA: Diagnosis not present

## 2019-09-21 NOTE — Patient Instructions (Signed)
Medication Instructions:  NO CHANGES *If you need a refill on your cardiac medications before your next appointment, please call your pharmacy*  Lab Work: Your physician recommends that you return for lab work TODAY (BNP)  Testing/Procedures: Your physician has requested that you have an echocardiogram. Echocardiography is a painless test that uses sound waves to create images of your heart. It provides your doctor with information about the size and shape of your heart and how well your heart's chambers and valves are working. This procedure takes approximately one hour. There are no restrictions for this procedure. [place]  Follow-Up: At [facility], you and your health needs are our priority.  As part of our continuing mission to provide you with exceptional heart care, we have created designated Provider Care Teams.  These Care Teams include your primary Cardiologist (physician) and Advanced Practice Providers (APPs -  Physician Assistants and Nurse Practitioners) who all work together to provide you with the care you need, when you need it.  Your next appointment:   2 month(s)  The format for your next appointment:   In Person  Provider:   Minus Breeding, MD

## 2019-09-22 LAB — BRAIN NATRIURETIC PEPTIDE: BNP: 38.4 pg/mL (ref 0.0–100.0)

## 2019-10-09 ENCOUNTER — Ambulatory Visit (HOSPITAL_COMMUNITY): Payer: Medicare Other | Attending: Cardiology

## 2019-10-09 ENCOUNTER — Other Ambulatory Visit: Payer: Self-pay

## 2019-10-09 DIAGNOSIS — R0602 Shortness of breath: Secondary | ICD-10-CM | POA: Diagnosis not present

## 2019-10-09 MED ORDER — PERFLUTREN LIPID MICROSPHERE
1.0000 mL | INTRAVENOUS | Status: AC | PRN
  Administered 2019-10-09: 1 mL via INTRAVENOUS

## 2019-11-18 NOTE — Progress Notes (Signed)
Cardiology Office Note   Date:  11/20/2019   ID:  DERRIC DEALMEIDA, DOB [birth year], MRN [id number]     PCP:  [name], MD  Cardiologist:   Minus Breeding, MD   Chief Complaint  Patient presents with  . Shortness of Breath      History of Present Illness: Daniel Whitaker is a 74 y.o. male who is referred by [name], MD for evaluation of DOE.   after my first visit with him I sent him for an echo which was normal and BNP was not elevated.   Since I saw him he has lost 15 pounds through Weight Watchers.  He feels much better.  He said his breathing is back to baseline.  He can climb the stairs without being short of breath.  He is not having the bendopathy that he was having.  He feels "100% better".  He denies chest pressure, neck or arm discomfort.  He said no PND or orthopnea.  Is not having any palpitations, presyncope or syncope.   Past Medical History:  Diagnosis Date  . Erectile dysfunction   . Esophageal reflux   . Glaucoma   . Hypertriglyceridemia   . Impaired fasting glucose   . MGUS (monoclonal gammopathy of unknown significance)   . Neuropathy associated with MGUS ([place]) 07/11/2013  . Nontoxic uninodular goiter   . Obesity   . Peripheral neuropathy   . Spinal stenosis   . Unspecified deficiency anemia 07/11/2013    Past Surgical History:  Procedure Laterality Date  . COLON RESECTION N/A 04/21/2013   Procedure: DIAGNOSTIC LAPAROSCOPY, lysis of adhesions for partial small bowel obstruction;  Surgeon: Odis Hollingshead, MD;  Location: [facility];  Service: General;  Laterality: N/A;  . KNEE SURGERY     Arthroscopic  . SMALL INTESTINE SURGERY     Blockage     Current Outpatient Medications  Medication Sig Dispense Refill  . albuterol (VENTOLIN HFA) 108 (90 Base) MCG/ACT inhaler Inhale 1-2 puffs into the lungs every 6 (six) hours as needed for wheezing or shortness of breath.    Marland Kitchen aspirin EC 81 MG tablet Take 81 mg by mouth daily.    .  cetirizine (ZYRTEC) 10 MG tablet Take 10 mg by mouth daily.    . Cholecalciferol (VITAMIN D) 2000 UNITS CAPS Take 2,000 Units by mouth.    . Cyanocobalamin (VITAMIN B-12 PO) Take 2 tablets by mouth daily.     . dorzolamide-timolol (COSOPT) 22.3-6.8 MG/ML ophthalmic solution Place 1 drop into the left eye 2 (two) times daily.    Marland Kitchen ketoconazole (NIZORAL) 2 % cream     . latanoprost (XALATAN) 0.005 % ophthalmic solution     . lidocaine (XYLOCAINE) 5 % ointment Apply 1 application topically 2 (two) times daily as needed. 35.44 g 3  . meloxicam (MOBIC) 15 MG tablet Take 15 mg by mouth daily.     . multivitamin-iron-minerals-folic acid (CENTRUM) chewable tablet Chew 1 tablet by mouth daily.    . Omega-3 Fatty Acids (FISH OIL) 1000 MG CAPS Take 1,000 mg by mouth daily. 3    . pantoprazole (PROTONIX) 40 MG tablet Take 40 mg by mouth daily.     . pregabalin (LYRICA) 200 MG capsule TAKE 1 CAPSULE BY MOUTH TWICE DAILY 60 capsule 5  . rOPINIRole (REQUIP) 1 MG tablet Take 1 mg by mouth at bedtime.     . sildenafil (REVATIO) 20 MG tablet Take 20-100 mg by mouth as needed.  No current facility-administered medications for this visit.    Allergies:   Cymbalta [duloxetine hcl] and Penicillins    ROS:  Please see the history of present illness.   Otherwise, review of systems are positive for none.   All other systems are reviewed and negative.    PHYSICAL EXAM: VS:  BP (!) 144/82   Pulse 65   Ht 6\' 1"  (1.854 m)   Wt 240 lb (108.9 kg)   SpO2 95%   BMI 31.66 kg/m  , BMI Body mass index is 31.66 kg/m. GENERAL:  Well appearing NECK:  No jugular venous distention, waveform within normal limits, carotid upstroke brisk and symmetric, no bruits, no thyromegaly LUNGS:  Clear to auscultation bilaterally CHEST:  Unremarkable HEART:  PMI not displaced or sustained,S1 and S2 within normal limits, no S3, no S4, no clicks, no rubs, no murmurs ABD:  Flat, positive bowel sounds normal in frequency in pitch, no  bruits, no rebound, no guarding, no midline pulsatile mass, no hepatomegaly, no splenomegaly EXT:  2 plus pulses throughout, no edema, no cyanosis no clubbing   EKG:  EKG is not ordered today.   Recent Labs: 01/12/2019: ALT 19; BUN 18; Creatinine, Ser 0.95; Hemoglobin 13.3; Platelets 171; Potassium 4.1; Sodium 141 09/21/2019: BNP 38.4    Lipid Panel No results found for: CHOL, TRIG, HDL, CHOLHDL, VLDL, LDLCALC, LDLDIRECT    Wt Readings from Last 3 Encounters:  11/20/19 240 lb (108.9 kg)  09/21/19 255 lb (115.7 kg)  04/05/19 251 lb 9.6 oz (114.1 kg)      Other studies Reviewed: Additional studies/ records that were reviewed today include:    Echo Review of the above records demonstrates:  NA    ASSESSMENT AND PLAN:  DOE:    His breathing is much improved.  This improved with weight loss.  Therefore, I am not planning further testing right now but I will address this further as below.   OBESITY:   Very proud of his weight loss and encouraged more the same.  LVH: I do note that he has some mild LVH.  There is some diastolic dysfunction.  He has had neuropathy and carpal tunnel syndrome but I do not strongly suspect amyloid.  He is much improved as above.  I am going to check an echocardiogram in 1 year and keep an eye on this.  In the meantime we will aggressively pursue blood pressure control, salt and fluid restriction.  HTN: Blood pressure is mildly elevated.  He is going to get a blood pressure cuff and keep a diary for me.  If his blood pressures not in the 120s to 130 range over 70s to 80s I will probably add a low-dose of amlodipine.  COVID EDUCATION: He has vaccine hesitancy and we talked about this again today.  I provided him with some education.  Current medicines are reviewed at length with the patient today.  The patient does not have concerns regarding medicines.  The following changes have been made:  None  Labs/ tests ordered today include:   Orders Placed  This Encounter  Procedures  . ECHOCARDIOGRAM COMPLETE     Disposition:   FU with me after the echo next year.    Signed, Minus Breeding, MD  11/20/2019 10:26 AM    [facility]

## 2019-11-20 ENCOUNTER — Other Ambulatory Visit: Payer: Self-pay

## 2019-11-20 ENCOUNTER — Encounter: Payer: Self-pay | Admitting: Cardiology

## 2019-11-20 ENCOUNTER — Ambulatory Visit: Payer: Medicare Other | Admitting: Cardiology

## 2019-11-20 VITALS — BP 144/82 | HR 65 | Ht 73.0 in | Wt 240.0 lb

## 2019-11-20 DIAGNOSIS — I517 Cardiomegaly: Secondary | ICD-10-CM | POA: Diagnosis not present

## 2019-11-20 DIAGNOSIS — R0609 Other forms of dyspnea: Secondary | ICD-10-CM

## 2019-11-20 DIAGNOSIS — R0600 Dyspnea, unspecified: Secondary | ICD-10-CM | POA: Diagnosis not present

## 2019-11-20 NOTE — Patient Instructions (Signed)
Medication Instructions:  NO CHANGES *If you need a refill on your cardiac medications before your next appointment, please call your pharmacy*  Lab Work: NONE ORDERED THIS VISIT  Testing/Procedures: Your physician has requested that you have an echocardiogram in 1 YEAR. Echocardiography is a painless test that uses sound waves to create images of your heart. It provides your doctor with information about the size and shape of your heart and how well your heart's chambers and valves are working. This procedure takes approximately one hour. There are no restrictions for this procedure. [place]  Follow-Up: At [facility], you and your health needs are our priority.  As part of our continuing mission to provide you with exceptional heart care, we have created designated Provider Care Teams.  These Care Teams include your primary Cardiologist (physician) and Advanced Practice Providers (APPs -  Physician Assistants and Nurse Practitioners) who all work together to provide you with the care you need, when you need it.  We recommend signing up for the patient portal called "MyChart".  Sign up information is provided on this After Visit Summary.  MyChart is used to connect with patients for Virtual Visits (Telemedicine).  Patients are able to view lab/test results, encounter notes, upcoming appointments, etc.  Non-urgent messages can be sent to your provider as well.   To learn more about what you can do with MyChart, go to [email or web address].    Your next appointment:   12 month(s) AFTER ECHO You will receive a reminder letter in the mail two months in advance. If you don't receive a letter, please call our office to schedule the follow-up appointment.  The format for your next appointment:   In Person  Provider:   Minus Breeding, MD  Other Instructions TWO WEEK BLOOD PRESSURE DIARY Purchase an automatic Omron blood pressure cuff for your upper arm.  Keep a  diary of your blood pressures.

## 2019-11-24 ENCOUNTER — Other Ambulatory Visit: Payer: Self-pay | Admitting: Adult Health

## 2020-01-18 ENCOUNTER — Other Ambulatory Visit: Payer: Medicare Other

## 2020-01-25 ENCOUNTER — Ambulatory Visit: Payer: Medicare Other | Admitting: Hematology and Oncology

## 2020-02-14 ENCOUNTER — Inpatient Hospital Stay: Payer: Medicare Other | Attending: Hematology and Oncology

## 2020-02-14 ENCOUNTER — Other Ambulatory Visit: Payer: Self-pay

## 2020-02-14 DIAGNOSIS — D472 Monoclonal gammopathy: Secondary | ICD-10-CM | POA: Diagnosis not present

## 2020-02-14 DIAGNOSIS — G629 Polyneuropathy, unspecified: Secondary | ICD-10-CM | POA: Diagnosis not present

## 2020-02-14 LAB — COMPREHENSIVE METABOLIC PANEL
ALT: 18 U/L (ref 0–44)
AST: 20 U/L (ref 15–41)
Albumin: 3.7 g/dL (ref 3.5–5.0)
Alkaline Phosphatase: 114 U/L (ref 38–126)
Anion gap: 5 (ref 5–15)
BUN: 26 mg/dL — ABNORMAL HIGH (ref 8–23)
CO2: 24 mmol/L (ref 22–32)
Calcium: 9.3 mg/dL (ref 8.9–10.3)
Chloride: 112 mmol/L — ABNORMAL HIGH (ref 98–111)
Creatinine, Ser: 0.88 mg/dL (ref 0.61–1.24)
GFR calc Af Amer: >60 mL/min (ref >60)
GFR calc non Af Amer: >60 mL/min (ref >60)
Glucose, Bld: 70 mg/dL (ref 70–99)
Potassium: 4.4 mmol/L (ref 3.5–5.1)
Sodium: 141 mmol/L (ref 135–145)
Total Bilirubin: 0.5 mg/dL (ref 0.3–1.2)
Total Protein: 6.9 g/dL (ref 6.5–8.1)

## 2020-02-14 LAB — CBC WITH DIFFERENTIAL/PLATELET
Abs Immature Granulocytes: 0.02 10*3/uL (ref 0.00–0.07)
Basophils Absolute: 0.1 10*3/uL (ref 0.0–0.1)
Basophils Relative: 1 %
Eosinophils Absolute: 0.3 10*3/uL (ref 0.0–0.5)
Eosinophils Relative: 5 %
HCT: 38.2 % — ABNORMAL LOW (ref 39.0–52.0)
Hemoglobin: 12.8 g/dL — ABNORMAL LOW (ref 13.0–17.0)
Immature Granulocytes: 0 %
Lymphocytes Relative: 35 %
Lymphs Abs: 2.2 10*3/uL (ref 0.7–4.0)
MCH: 31 pg (ref 26.0–34.0)
MCHC: 33.5 g/dL (ref 30.0–36.0)
MCV: 92.5 fL (ref 80.0–100.0)
Monocytes Absolute: 0.6 10*3/uL (ref 0.1–1.0)
Monocytes Relative: 9 %
Neutro Abs: 3.1 10*3/uL (ref 1.7–7.7)
Neutrophils Relative %: 50 %
Platelets: 174 10*3/uL (ref 150–400)
RBC: 4.13 MIL/uL — ABNORMAL LOW (ref 4.22–5.81)
RDW: 14 % (ref 11.5–15.5)
WBC: 6.3 10*3/uL (ref 4.0–10.5)
nRBC: 0 % (ref 0.0–0.2)

## 2020-02-15 LAB — KAPPA/LAMBDA LIGHT CHAINS
Kappa free light chain: 33.6 mg/L — ABNORMAL HIGH (ref 3.3–19.4)
Kappa, lambda light chain ratio: 3.5 — ABNORMAL HIGH (ref 0.26–1.65)
Lambda free light chains: 9.6 mg/L (ref 5.7–26.3)

## 2020-02-16 LAB — MULTIPLE MYELOMA PANEL, SERUM
Albumin SerPl Elph-Mcnc: 3.8 g/dL (ref 2.9–4.4)
Albumin/Glob SerPl: 1.5 (ref 0.7–1.7)
Alpha 1: 0.2 g/dL (ref 0.0–0.4)
Alpha2 Glob SerPl Elph-Mcnc: 0.6 g/dL (ref 0.4–1.0)
B-Globulin SerPl Elph-Mcnc: 0.9 g/dL (ref 0.7–1.3)
Gamma Glob SerPl Elph-Mcnc: 1 g/dL (ref 0.4–1.8)
Globulin, Total: 2.6 g/dL (ref 2.2–3.9)
IgA: 402 mg/dL (ref 61–437)
IgG (Immunoglobin G), Serum: 718 mg/dL (ref 603–1613)
IgM (Immunoglobulin M), Srm: 73 mg/dL (ref 15–143)
M Protein SerPl Elph-Mcnc: 0.2 g/dL — ABNORMAL HIGH
Total Protein ELP: 6.4 g/dL (ref 6.0–8.5)

## 2020-02-21 ENCOUNTER — Encounter: Payer: Self-pay | Admitting: Hematology and Oncology

## 2020-02-21 ENCOUNTER — Other Ambulatory Visit: Payer: Self-pay

## 2020-02-21 ENCOUNTER — Inpatient Hospital Stay: Payer: Medicare Other | Admitting: Hematology and Oncology

## 2020-02-21 VITALS — BP 136/62 | HR 98 | Temp 98.3°F | Resp 18 | Ht 73.0 in | Wt 237.2 lb

## 2020-02-21 DIAGNOSIS — D472 Monoclonal gammopathy: Secondary | ICD-10-CM

## 2020-02-21 NOTE — Progress Notes (Signed)
[facility] OFFICE PROGRESS NOTE  Patient Care Team: [name], MD as PCP - General (Family Medicine) Minus Breeding, MD as PCP - Cardiology (Cardiology)  ASSESSMENT & PLAN:  MGUS (monoclonal gammopathy of unknown significance) Clinically, he has no presence of disease progression. I plan to see him on a yearly basis with history, physical examination and blood work.    Orders Placed This Encounter  Procedures  . Comprehensive metabolic panel    Standing Status:   Standing    Number of Occurrences:   33    Standing Expiration Date:   02/20/2021  . CBC with Differential/Platelet    Standing Status:   Standing    Number of Occurrences:   22    Standing Expiration Date:   02/20/2021  . Kappa/lambda light chains    Standing Status:   Standing    Number of Occurrences:   22    Standing Expiration Date:   02/20/2021  . Multiple Myeloma Panel (SPEP&IFE w/QIG)    Standing Status:   Standing    Number of Occurrences:   22    Standing Expiration Date:   02/20/2021    All questions were answered. The patient knows to call the clinic with any problems, questions or concerns. The total time spent in the appointment was 15 minutes encounter with patients including review of chart and various tests results, discussions about plan of care and coordination of care plan   Heath Lark, MD 02/21/2020 2:12 PM  INTERVAL HISTORY: Please see below for problem oriented charting. He returns for further follow-up He contracted COVID-19 infection several weeks ago but has fully recovered He self isolated He did not receive any treatment No new bone pain Neuropathy is stable  SUMMARY OF ONCOLOGIC HISTORY:  This is a pleasant patient with severe peripheral neuropathy was referred here for evaluation of abnormal serum protein electrophoresis. Further studies revealed monoclonal gammopathy of unknown significance that is not the cause of his peripheral neuropathy, IgA kappa subtype.  The peripheral neuropathy was thought to be related to alcoholism. He is being observed.  REVIEW OF SYSTEMS:   Constitutional: Denies fevers, chills or abnormal weight loss Eyes: Denies blurriness of vision Ears, nose, mouth, throat, and face: Denies mucositis or sore throat Respiratory: Denies cough, dyspnea or wheezes Cardiovascular: Denies palpitation, chest discomfort or lower extremity swelling Gastrointestinal:  Denies nausea, heartburn or change in bowel habits Skin: Denies abnormal skin rashes Lymphatics: Denies new lymphadenopathy or easy bruising Neurological:Denies numbness, tingling or new weaknesses Behavioral/Psych: Mood is stable, no new changes  All other systems were reviewed with the patient and are negative.  I have reviewed the past medical history, past surgical history, social history and family history with the patient and they are unchanged from previous note.  ALLERGIES:  is allergic to cymbalta [duloxetine hcl] and penicillins.  MEDICATIONS:  Current Outpatient Medications  Medication Sig Dispense Refill  . brimonidine (ALPHAGAN) 0.2 % ophthalmic solution Place 1 drop into both eyes in the morning and at bedtime.    . pregabalin (LYRICA) 200 MG capsule TAKE 1 CAPSULE BY MOUTH TWICE DAILY 60 capsule 5  . albuterol (VENTOLIN HFA) 108 (90 Base) MCG/ACT inhaler Inhale 1-2 puffs into the lungs every 6 (six) hours as needed for wheezing or shortness of breath.    Marland Kitchen aspirin EC 81 MG tablet Take 81 mg by mouth daily.    . cetirizine (ZYRTEC) 10 MG tablet Take 10 mg by mouth daily.    Marland Kitchen  Cholecalciferol (VITAMIN D) 2000 UNITS CAPS Take 2,000 Units by mouth.    . Cyanocobalamin (VITAMIN B-12 PO) Take 2 tablets by mouth daily.     . dorzolamide-timolol (COSOPT) 22.3-6.8 MG/ML ophthalmic solution Place 1 drop into the left eye 2 (two) times daily.    Marland Kitchen ketoconazole (NIZORAL) 2 % cream     . latanoprost (XALATAN) 0.005 % ophthalmic solution     . lidocaine (XYLOCAINE) 5 %  ointment Apply 1 application topically 2 (two) times daily as needed. 35.44 g 3  . meloxicam (MOBIC) 15 MG tablet Take 15 mg by mouth daily.     . multivitamin-iron-minerals-folic acid (CENTRUM) chewable tablet Chew 1 tablet by mouth daily.    . Omega-3 Fatty Acids (FISH OIL) 1000 MG CAPS Take 1,000 mg by mouth daily. 3    . pantoprazole (PROTONIX) 40 MG tablet Take 40 mg by mouth daily.     Marland Kitchen rOPINIRole (REQUIP) 1 MG tablet Take 1 mg by mouth at bedtime.     . sildenafil (REVATIO) 20 MG tablet Take 20-100 mg by mouth as needed.     No current facility-administered medications for this visit.    PHYSICAL EXAMINATION: ECOG PERFORMANCE STATUS: 1 - Symptomatic but completely ambulatory  Vitals:   02/21/20 1126  BP: 136/62  Pulse: 98  Resp: 18  Temp: 98.3 F (36.8 C)  SpO2: 99%   Filed Weights   02/21/20 1126  Weight: 237 lb 3.2 oz (107.6 kg)    GENERAL:alert, no distress and comfortable NEURO: alert & oriented x 3 with fluent speech, no focal motor/sensory deficits  LABORATORY DATA:  I have reviewed the data as listed    Component Value Date/Time   NA 141 02/14/2020 1115   NA 139 01/12/2017 1011   K 4.4 02/14/2020 1115   K 4.3 01/12/2017 1011   CL 112 (H) 02/14/2020 1115   CO2 24 02/14/2020 1115   CO2 23 01/12/2017 1011   GLUCOSE 70 02/14/2020 1115   GLUCOSE 98 01/12/2017 1011   BUN 26 (H) 02/14/2020 1115   BUN 21.8 01/12/2017 1011   CREATININE 0.88 02/14/2020 1115   CREATININE 0.9 01/12/2017 1011   CALCIUM 9.3 02/14/2020 1115   CALCIUM 9.0 01/12/2017 1011   PROT 6.9 02/14/2020 1115   PROT 6.4 01/12/2017 1011   PROT 6.1 01/12/2017 1011   ALBUMIN 3.7 02/14/2020 1115   ALBUMIN 3.6 01/12/2017 1011   AST 20 02/14/2020 1115   AST 20 01/12/2017 1011   ALT 18 02/14/2020 1115   ALT 18 01/12/2017 1011   ALKPHOS 114 02/14/2020 1115   ALKPHOS 81 01/12/2017 1011   BILITOT 0.5 02/14/2020 1115   BILITOT 0.58 01/12/2017 1011   GFRNONAA >60 02/14/2020 1115   GFRAA >60  02/14/2020 1115    No results found for: SPEP, UPEP  Lab Results  Component Value Date   WBC 6.3 02/14/2020   NEUTROABS 3.1 02/14/2020   HGB 12.8 (L) 02/14/2020   HCT 38.2 (L) 02/14/2020   MCV 92.5 02/14/2020   PLT 174 02/14/2020      Chemistry      Component Value Date/Time   NA 141 02/14/2020 1115   NA 139 01/12/2017 1011   K 4.4 02/14/2020 1115   K 4.3 01/12/2017 1011   CL 112 (H) 02/14/2020 1115   CO2 24 02/14/2020 1115   CO2 23 01/12/2017 1011   BUN 26 (H) 02/14/2020 1115   BUN 21.8 01/12/2017 1011   CREATININE 0.88 02/14/2020 1115  CREATININE 0.9 01/12/2017 1011      Component Value Date/Time   CALCIUM 9.3 02/14/2020 1115   CALCIUM 9.0 01/12/2017 1011   ALKPHOS 114 02/14/2020 1115   ALKPHOS 81 01/12/2017 1011   AST 20 02/14/2020 1115   AST 20 01/12/2017 1011   ALT 18 02/14/2020 1115   ALT 18 01/12/2017 1011   BILITOT 0.5 02/14/2020 1115   BILITOT 0.58 01/12/2017 1011

## 2020-02-21 NOTE — Assessment & Plan Note (Signed)
Clinically, he has no presence of disease progression. I plan to see him on a yearly basis with history, physical examination and blood work.

## 2020-04-04 ENCOUNTER — Ambulatory Visit: Payer: Medicare Other | Admitting: Adult Health

## 2020-04-04 ENCOUNTER — Encounter: Payer: Self-pay | Admitting: Adult Health

## 2020-04-04 VITALS — BP 124/82 | HR 80 | Ht 73.0 in | Wt 246.8 lb

## 2020-04-04 DIAGNOSIS — G2581 Restless legs syndrome: Secondary | ICD-10-CM | POA: Diagnosis not present

## 2020-04-04 DIAGNOSIS — G63 Polyneuropathy in diseases classified elsewhere: Secondary | ICD-10-CM | POA: Diagnosis not present

## 2020-04-04 DIAGNOSIS — D472 Monoclonal gammopathy: Secondary | ICD-10-CM | POA: Diagnosis not present

## 2020-04-04 MED ORDER — PREGABALIN 200 MG PO CAPS: 200.0000 mg | ORAL_CAPSULE | Freq: Two times a day (BID) | ORAL | 5 refills | Status: DC

## 2020-04-04 NOTE — Progress Notes (Signed)
I have read the note, and I agree with the clinical assessment and plan.  Petro Talent K Tilda Samudio   

## 2020-04-04 NOTE — Patient Instructions (Signed)
Your Plan:  Continue Lyrica and reqiup If your symptoms worsen or you develop new symptoms please let us know.    Thank you for coming to see [place] at [facility]. I hope we have been able to provide you high quality care today.  You may receive a patient satisfaction survey over the next few weeks. We would appreciate your feedback and comments so that we may continue to improve ourselves and the health of our patients.

## 2020-04-04 NOTE — Progress Notes (Signed)
PATIENT: Daniel Whitaker DOB: January 19, 1946  REASON FOR VISIT: follow up HISTORY FROM: patient  HISTORY OF PRESENT ILLNESS: Today 04/04/20:  [name] is a 74 year old male with a history of peripheral neuropathy and restless leg syndrome.  He returns today for follow-up.  He reports that he gets burning and tingling pain in the first 3 toes on the left foot.  He states that it occurs at periodic times.  Typically if he puts socks and shoes on this pain goes away.  Reports that he did stump the toe on his left foot.  He states that the nail was bruised but is slowly improving.  Denies being diabetic.  Does use lidocaine cream and finds it offers him some benefit.  Denies any significant changes with his gait or balance.  Denies any falls.  HISTORY 04/05/19:  [name] is a 74 year old male with a history of peripheral neuropathy and restless leg symptoms.  He returns today for follow-up.  He continues on Lyrica 200 mg twice a day.  Overall he states that he is doing fairly well.  When he is resting or at bedtime is when he has burning in the feet.  He reports that he has a spot on the right foot that always burns.  He does have lidocaine cream at home but has not been using it.  He denies any changes with his gait or balance.  He returns today for an evaluation.  REVIEW OF SYSTEMS: Out of a complete 14 system review of symptoms, the patient complains only of the following symptoms, and all other reviewed systems are negative.  See HPI  ALLERGIES: Allergies  Allergen Reactions  . Cymbalta [Duloxetine Hcl]     Drowsiness, nausea  . Penicillins     HOME MEDICATIONS: Outpatient Medications Prior to Visit  Medication Sig Dispense Refill  . aspirin EC 81 MG tablet Take 81 mg by mouth daily.    . brimonidine (ALPHAGAN) 0.2 % ophthalmic solution Place 1 drop into both eyes in the morning and at bedtime.    . cetirizine (ZYRTEC) 10 MG tablet Take 10 mg by mouth daily.    .  Cholecalciferol (VITAMIN D) 2000 UNITS CAPS Take 2,000 Units by mouth.    . Cyanocobalamin (VITAMIN B-12 PO) Take 2 tablets by mouth daily.     . dorzolamide-timolol (COSOPT) 22.3-6.8 MG/ML ophthalmic solution Place 1 drop into the left eye 2 (two) times daily.    Marland Kitchen ketoconazole (NIZORAL) 2 % cream     . latanoprost (XALATAN) 0.005 % ophthalmic solution     . lidocaine (XYLOCAINE) 5 % ointment Apply 1 application topically 2 (two) times daily as needed. 35.44 g 3  . meloxicam (MOBIC) 15 MG tablet Take 15 mg by mouth daily.     . multivitamin-iron-minerals-folic acid (CENTRUM) chewable tablet Chew 1 tablet by mouth daily.    . Omega-3 Fatty Acids (FISH OIL) 1000 MG CAPS Take 1,000 mg by mouth daily. 3    . pantoprazole (PROTONIX) 40 MG tablet Take 40 mg by mouth daily.     . pregabalin (LYRICA) 200 MG capsule TAKE 1 CAPSULE BY MOUTH TWICE DAILY 60 capsule 5  . rOPINIRole (REQUIP) 1 MG tablet Take 1 mg by mouth at bedtime.     . sildenafil (REVATIO) 20 MG tablet Take 20-100 mg by mouth as needed.    Marland Kitchen albuterol (VENTOLIN HFA) 108 (90 Base) MCG/ACT inhaler Inhale 1-2 puffs into the lungs every 6 (six) hours as needed  for wheezing or shortness of breath.     No facility-administered medications prior to visit.    PAST MEDICAL HISTORY: Past Medical History:  Diagnosis Date  . Erectile dysfunction   . Esophageal reflux   . Glaucoma   . Hypertriglyceridemia   . Impaired fasting glucose   . MGUS (monoclonal gammopathy of unknown significance)   . Neuropathy associated with MGUS ([place]) 07/11/2013  . Nontoxic uninodular goiter   . Obesity   . Peripheral neuropathy   . Spinal stenosis   . Unspecified deficiency anemia 07/11/2013    PAST SURGICAL HISTORY: Past Surgical History:  Procedure Laterality Date  . COLON RESECTION N/A 04/21/2013   Procedure: DIAGNOSTIC LAPAROSCOPY, lysis of adhesions for partial small bowel obstruction;  Surgeon: Odis Hollingshead, MD;  Location: [facility];  Service:  General;  Laterality: N/A;  . KNEE SURGERY     Arthroscopic  . SMALL INTESTINE SURGERY     Blockage    FAMILY HISTORY: Family History  Problem Relation Age of Onset  . Brain cancer Mother   . Lung cancer Father   . Heart disease Brother        No details  . Dementia Brother   . Neuropathy Neg Hx     SOCIAL HISTORY: Social History   Socioeconomic History  . Marital status: Married    Spouse name: [name]  . Number of children: 2  . Years of education: GED  . Highest education level: Not on file  Occupational History  . Occupation: still working  Tobacco Use  . Smoking status: Former Smoker    Quit date: 06/01/1984    Years since quitting: 35.8  . Smokeless tobacco: Never Used  Substance and Sexual Activity  . Alcohol use: Yes    Alcohol/week: 6.0 - 8.0 standard drinks    Types: 6 - 8 Cans of beer per week    Comment: 12 BEERS WEEKLY  . Drug use: No  . Sexual activity: Not on file  Other Topics Concern  . Not on file  Social History Narrative   Patient lives at home with his wife [name])   Retired.    Patient has two children.   Patient is right-handed.   Patient has his GED.   Patient does not drink caffeine.   Social Determinants of Health   Financial Resource Strain:   . Difficulty of Paying Living Expenses: Not on file  Food Insecurity:   . Worried About Charity fundraiser in the Last Year: Not on file  . Ran Out of Food in the Last Year: Not on file  Transportation Needs:   . Lack of Transportation (Medical): Not on file  . Lack of Transportation (Non-Medical): Not on file  Physical Activity:   . Days of Exercise per Week: Not on file  . Minutes of Exercise per Session: Not on file  Stress:   . Feeling of Stress : Not on file  Social Connections:   . Frequency of Communication with Friends and Family: Not on file  . Frequency of Social Gatherings with Friends and Family: Not on file  . Attends Religious Services: Not on file  . Active Member of  Clubs or Organizations: Not on file  . Attends Archivist Meetings: Not on file  . Marital Status: Not on file  Intimate Partner Violence:   . Fear of Current or Ex-Partner: Not on file  . Emotionally Abused: Not on file  . Physically Abused: Not on file  .  Sexually Abused: Not on file      PHYSICAL EXAM  Vitals:   04/04/20 0925  BP: 124/82  Pulse: 80  Weight: 246 lb 12.8 oz (111.9 kg)  Height: 6\' 1"  (1.854 m)   Body mass index is 32.56 kg/m.  Generalized: Well developed, in no acute distress   Neurological examination  Mentation: Alert oriented to time, place, history taking. Follows all commands speech and language fluent Cranial nerve II-XII: Pupils were equal round reactive to light. Extraocular movements were full, visual field were full on confrontational test. . Head turning and shoulder shrug  were normal and symmetric. Motor: The motor testing reveals 5 over 5 strength of all 4 extremities. Good symmetric motor tone is noted throughout.  Sensory: Vibration and pinprick sensation decreased in the lower extremities in a stocking-like pattern.  Bruising of the second toe on the left foot.  Good capillary refill.  Good DP pulses in both feet Coordination: Cerebellar testing reveals good finger-nose-finger and heel-to-shin bilaterally.  Gait and station: Gait is normal.  Tandem gait is unsteady.  Romberg is negative Reflexes: Deep tendon reflexes are symmetric and normal bilaterally.   DIAGNOSTIC DATA (LABS, IMAGING, TESTING) - I reviewed patient records, labs, notes, testing and imaging myself where available.  Lab Results  Component Value Date   WBC 6.3 02/14/2020   HGB 12.8 (L) 02/14/2020   HCT 38.2 (L) 02/14/2020   MCV 92.5 02/14/2020   PLT 174 02/14/2020      Component Value Date/Time   NA 141 02/14/2020 1115   NA 139 01/12/2017 1011   K 4.4 02/14/2020 1115   K 4.3 01/12/2017 1011   CL 112 (H) 02/14/2020 1115   CO2 24 02/14/2020 1115   CO2 23  01/12/2017 1011   GLUCOSE 70 02/14/2020 1115   GLUCOSE 98 01/12/2017 1011   BUN 26 (H) 02/14/2020 1115   BUN 21.8 01/12/2017 1011   CREATININE 0.88 02/14/2020 1115   CREATININE 0.9 01/12/2017 1011   CALCIUM 9.3 02/14/2020 1115   CALCIUM 9.0 01/12/2017 1011   PROT 6.9 02/14/2020 1115   PROT 6.4 01/12/2017 1011   PROT 6.1 01/12/2017 1011   ALBUMIN 3.7 02/14/2020 1115   ALBUMIN 3.6 01/12/2017 1011   AST 20 02/14/2020 1115   AST 20 01/12/2017 1011   ALT 18 02/14/2020 1115   ALT 18 01/12/2017 1011   ALKPHOS 114 02/14/2020 1115   ALKPHOS 81 01/12/2017 1011   BILITOT 0.5 02/14/2020 1115   BILITOT 0.58 01/12/2017 1011   GFRNONAA >60 02/14/2020 1115   GFRAA >60 02/14/2020 1115   No results found for: CHOL, HDL, LDLCALC, LDLDIRECT, TRIG, CHOLHDL Lab Results  Component Value Date   HGBA1C 5.2 04/15/2013   Lab Results  Component Value Date   VITAMINB12 390 07/13/2013   Lab Results  Component Value Date   TSH 1.195 04/16/2013      ASSESSMENT AND PLAN 74 y.o. year old male  has a past medical history of Erectile dysfunction, Esophageal reflux, Glaucoma, Hypertriglyceridemia, Impaired fasting glucose, MGUS (monoclonal gammopathy of unknown significance), Neuropathy associated with MGUS ([place]) (07/11/2013), Nontoxic uninodular goiter, Obesity, Peripheral neuropathy, Spinal stenosis, and Unspecified deficiency anemia (07/11/2013). here with:  1.  Peripheral neuropathy 2.  Restless leg syndrome  -Continue Lyrica 200 mg twice a day -Continue Requip 1 mg at bedtime -Continue using lidocaine cream -Advised if symptoms worsen or he develops new symptoms he should let us know -Follow-up in 6 months or sooner if needed   I spent  30 minutes of face-to-face and non-face-to-face time with patient.  This included previsit chart review, lab review, study review, order entry, electronic health record documentation, patient education.  Ward Givens, MSN, NP-C 04/04/2020, 9:28 AM [facility] [address], [place], [place] [address] [phone]

## 2020-04-09 ENCOUNTER — Other Ambulatory Visit: Payer: Self-pay | Admitting: Family Medicine

## 2020-04-09 DIAGNOSIS — E041 Nontoxic single thyroid nodule: Secondary | ICD-10-CM

## 2020-04-29 ENCOUNTER — Ambulatory Visit
Admission: RE | Admit: 2020-04-29 | Discharge: 2020-04-29 | Disposition: A | Discharge status: Home / Self Care | Payer: Medicare Other | Source: Ambulatory Visit | Attending: Family Medicine | Admitting: Family Medicine

## 2020-04-29 DIAGNOSIS — E041 Nontoxic single thyroid nodule: Secondary | ICD-10-CM

## 2020-07-25 ENCOUNTER — Encounter: Payer: Self-pay | Admitting: Gastroenterology

## 2020-08-08 ENCOUNTER — Telehealth: Payer: Self-pay | Admitting: Adult Health

## 2020-08-08 NOTE — Telephone Encounter (Signed)
Pt called, week ago neuropathy getting worse in left foot. Pain is almost continuous. Would like a call from the nurse.

## 2020-08-08 NOTE — Telephone Encounter (Signed)
I called pt. patient states that he started about a week ago on his left foot smaller toes the middle one in particular just causing more shooting pains but constant pain with his other neuropathic pain that he has always had.  He is on Lyrica 200 mg twice a day, Requip 1 mg by mouth at bedtime, and uses the lidocaine cream as needed.  No activity changes. the only change is Lipitor to his regimen  and that was the  2 months ago. Recommendations.  Last seen 04-04-20 , to see one year.

## 2020-08-12 NOTE — Telephone Encounter (Signed)
LMVm for pt mobile, to return call if still having pain, not improved to make appt ok to use 1100 08-15-20 research slot.

## 2020-08-12 NOTE — Telephone Encounter (Signed)
If patient is still having discomfort and it is not improved then please schedule him for a an appointment

## 2020-08-13 NOTE — Telephone Encounter (Signed)
LMVM for pt that returning call.  (second call).

## 2020-08-13 NOTE — Telephone Encounter (Signed)
Pt returned call and he is better, not sure why, will hold for now about appt.  Will call back if comes back and will try to get him in.  He appreciated call back.

## 2020-08-22 ENCOUNTER — Telehealth: Payer: Self-pay | Admitting: Adult Health

## 2020-08-22 NOTE — Telephone Encounter (Signed)
I called the pt and we discussed message. He reports neuropathy in the left foot has returned and would like to be worked in. MM did not have any openings this coming week, but Dr. Jannifer Franklin has a 1200 pm for next week. Pt has been scheduled for appt.

## 2020-08-22 NOTE — Telephone Encounter (Signed)
Error

## 2020-08-22 NOTE — Telephone Encounter (Signed)
Pt called, pain in my left foot is worse. Wanting to know if you could work me in? Would like a call from the nurse.

## 2020-08-28 ENCOUNTER — Encounter: Payer: Self-pay | Admitting: Neurology

## 2020-08-28 ENCOUNTER — Ambulatory Visit: Payer: Medicare Other | Admitting: Neurology

## 2020-08-28 ENCOUNTER — Other Ambulatory Visit: Payer: Self-pay

## 2020-08-28 VITALS — BP 142/81 | HR 68 | Ht 73.0 in | Wt 248.2 lb

## 2020-08-28 DIAGNOSIS — G609 Hereditary and idiopathic neuropathy, unspecified: Secondary | ICD-10-CM | POA: Diagnosis not present

## 2020-08-28 DIAGNOSIS — D472 Monoclonal gammopathy: Secondary | ICD-10-CM | POA: Diagnosis not present

## 2020-08-28 MED ORDER — PREGABALIN 200 MG PO CAPS: 200.0000 mg | ORAL_CAPSULE | Freq: Two times a day (BID) | ORAL | 1 refills | Status: DC

## 2020-08-28 MED ORDER — ROPINIROLE HCL 2 MG PO TABS: 2.0000 mg | ORAL_TABLET | Freq: Every day | ORAL | 3 refills | Status: DC

## 2020-08-28 NOTE — Progress Notes (Signed)
Reason for visit: Peripheral neuropathy, monoclonal antibody  Lynne Logan is an 75 y.o. male  History of present illness:  Mr. 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  The patient also has restless leg syndrome.  He reports that he has most of his numbness and discomfort in the distal portions of the feet bilaterally, particularly with the big toes.  He is having increasing problems with restless leg syndrome, he takes 1 mg of Requip at night.  Wearing shoes and thick socks helps his neuropathy discomfort during the day.  The patient is on Lyrica taking 200 mg twice daily.  He has fallen about a month ago when he was running up a flight of stairs and fell forward and hurt his right wrist.  He will be seen through an orthopedic surgeon in the near future.  The patient otherwise is doing well, no new issues have come up.  Past Medical History:  Diagnosis Date  . Erectile dysfunction   . Esophageal reflux   . Glaucoma   . Hypertriglyceridemia   . Impaired fasting glucose   . MGUS (monoclonal gammopathy of unknown significance)   . Neuropathy associated with MGUS ([place]) 07/11/2013  . Nontoxic uninodular goiter   . Obesity   . Peripheral neuropathy   . Spinal stenosis   . Unspecified deficiency anemia 07/11/2013    Past Surgical History:  Procedure Laterality Date  . COLON RESECTION N/A 04/21/2013   Procedure: DIAGNOSTIC LAPAROSCOPY, lysis of adhesions for partial small bowel obstruction;  Surgeon: Odis Hollingshead, MD;  Location: [facility];  Service: General;  Laterality: N/A;  . KNEE SURGERY     Arthroscopic  . SMALL INTESTINE SURGERY     Blockage    Family History  Problem Relation Age of Onset  . Brain cancer Mother   . Lung cancer Father   . Heart disease Brother        No details  . Dementia Brother   . Neuropathy Neg Hx     Social history:  reports that he quit smoking about  36 years ago. He has never used smokeless tobacco. He reports current alcohol use of about 6.0 - 8.0 standard drinks of alcohol per week. He reports that he does not use drugs.    Allergies  Allergen Reactions  . Cymbalta [Duloxetine Hcl]     Drowsiness, nausea  . Penicillins Hives    Medications:  Prior to Admission medications   Medication Sig Start Date End Date Taking? Authorizing Provider  aspirin EC 81 MG tablet Take 81 mg by mouth daily.   Yes [provider]  atorvastatin (LIPITOR) 10 MG tablet Take 10 mg by mouth daily. 04/10/20  Yes [provider]  brimonidine (ALPHAGAN) 0.2 % ophthalmic solution Place 1 drop into both eyes in the morning and at bedtime. 01/08/20  Yes [provider]  cetirizine (ZYRTEC) 10 MG tablet Take 10 mg by mouth daily.   Yes [provider]  Cholecalciferol (VITAMIN D) 2000 UNITS CAPS Take 2,000 Units by mouth.   Yes [provider]  Cyanocobalamin (VITAMIN B-12 PO) Take 2 tablets by mouth daily.    Yes [provider]  dorzolamide-timolol (COSOPT) 22.3-6.8 MG/ML ophthalmic solution Place 1 drop into the left eye 2 (two) times daily. 03/09/18  Yes [provider]  ketoconazole (NIZORAL) 2 % cream  11/28/14  Yes [provider]  latanoprost (XALATAN) 0.005 %  ophthalmic solution  03/01/13  Yes [provider]  lidocaine (XYLOCAINE) 5 % ointment Apply 1 application topically 2 (two) times daily as needed. 07/17/13  Yes Drema Dallas, DO  meloxicam (MOBIC) 15 MG tablet Take 15 mg by mouth daily.  04/03/13  Yes [provider]  multivitamin-iron-minerals-folic acid (CENTRUM) chewable tablet Chew 1 tablet by mouth daily.   Yes [provider]  Omega-3 Fatty Acids (FISH OIL) 1000 MG CAPS Take 1,000 mg by mouth daily. 3   Yes [provider]  pantoprazole (PROTONIX) 40 MG tablet Take 40 mg by mouth daily.  03/14/13  Yes [provider]  pregabalin  (LYRICA) 200 MG capsule Take 1 capsule (200 mg total) by mouth 2 (two) times daily. 04/04/20  Yes Ward Givens, NP  rOPINIRole (REQUIP) 1 MG tablet Take 1 mg by mouth at bedtime.  11/28/14  Yes [provider]  sildenafil (REVATIO) 20 MG tablet Take 20-100 mg by mouth as needed.   Yes [provider]    ROS:  Out of a complete 14 system review of symptoms, the patient complains only of the following symptoms, and all other reviewed systems are negative.  Foot pain Right wrist pain Restless legs  Blood pressure (!) 142/81, pulse 68, height 6\' 1"  (1.854 m), weight 248 lb 3.2 oz (112.6 kg).  Physical Exam  General: The patient is alert and cooperative at the time of the examination.  Skin: No significant peripheral edema is noted.   Neurologic Exam  Mental status: The patient is alert and oriented x 3 at the time of the examination. The patient has apparent normal recent and remote memory, with an apparently normal attention span and concentration ability.   Cranial nerves: Facial symmetry is present. Speech is normal, no aphasia or dysarthria is noted. Extraocular movements are full. Visual fields are full.  Motor: The patient has good strength in all 4 extremities.  Sensory examination: Soft touch sensation is symmetric on the face, arms, and legs.  Coordination: The patient has good finger-nose-finger and heel-to-shin bilaterally.  Gait and station: The patient has a normal gait. Tandem gait is slightly unsteady. Romberg is negative. No drift is seen.  Reflexes: Deep tendon reflexes are symmetric.   Assessment/Plan:  1.  Peripheral neuropathy  2. MGUS, IgA  3.  Restless leg syndrome  The patient is followed through oncology for his monoclonal antibody.  The patient is having some ongoing discomfort in the feet, may use capsaicin cream 3 times a day for this.  The patient will be given a prescription for his Lyrica.  We will go up on the Requip to 2  mg in the evening for his restless leg syndrome.  We will follow up here in 6 months.  Jill Alexanders MD 08/28/2020 12:39 PM  [facility] [address] [place], [place] [address]  Phone [phone] Fax [phone]

## 2020-08-28 NOTE — Patient Instructions (Signed)
May try using capsaicin cream 3 times a day for the neuropathy pain in the feet.

## 2020-09-10 DIAGNOSIS — M25531 Pain in right wrist: Secondary | ICD-10-CM | POA: Diagnosis not present

## 2020-09-10 DIAGNOSIS — M654 Radial styloid tenosynovitis [de Quervain]: Secondary | ICD-10-CM | POA: Diagnosis not present

## 2020-09-17 ENCOUNTER — Ambulatory Visit (AMBULATORY_SURGERY_CENTER): Payer: Medicare Other | Admitting: *Deleted

## 2020-09-17 ENCOUNTER — Other Ambulatory Visit: Payer: Self-pay

## 2020-09-17 VITALS — Ht 73.0 in | Wt 240.0 lb

## 2020-09-17 DIAGNOSIS — Z1211 Encounter for screening for malignant neoplasm of colon: Secondary | ICD-10-CM

## 2020-09-17 NOTE — Progress Notes (Signed)
No egg or soy allergy known to patient  No issues with past sedation with any surgeries or procedures Patient denies ever being told they had issues or difficulty with intubation  No FH of Malignant Hyperthermia No diet pills per patient No home 02 use per patient  No blood thinners per patient  Pt denies issues with constipation  No A fib or A flutter  EMMI video to pt or via [place] 19 guidelines implemented in [place] today with Pt and RN  Pt is not  vaccinated  for Covid    Due to the COVID-19 pandemic we are asking patients to follow certain guidelines.  Pt aware of COVID protocols and [facility] guidelines   Pt verified name, DOB, address and insurance during PV today. Pt mailed instruction packet to included paper to complete and mail back to [facility] with addressed and stamped envelope, Emmi video, copy of consent form to read and not return, and instructions. PV completed over the phone . Pt encouraged to call with questions or issues. My Chart instructions to pt as well

## 2020-09-30 ENCOUNTER — Encounter: Payer: Self-pay | Admitting: Gastroenterology

## 2020-09-30 DIAGNOSIS — H1045 Other chronic allergic conjunctivitis: Secondary | ICD-10-CM | POA: Diagnosis not present

## 2020-09-30 DIAGNOSIS — Z9889 Other specified postprocedural states: Secondary | ICD-10-CM | POA: Diagnosis not present

## 2020-09-30 DIAGNOSIS — H04123 Dry eye syndrome of bilateral lacrimal glands: Secondary | ICD-10-CM | POA: Diagnosis not present

## 2020-09-30 DIAGNOSIS — Z961 Presence of intraocular lens: Secondary | ICD-10-CM | POA: Diagnosis not present

## 2020-09-30 DIAGNOSIS — T1511XA Foreign body in conjunctival sac, right eye, initial encounter: Secondary | ICD-10-CM | POA: Diagnosis not present

## 2020-10-01 ENCOUNTER — Encounter: Payer: Self-pay | Admitting: Gastroenterology

## 2020-10-01 ENCOUNTER — Ambulatory Visit (AMBULATORY_SURGERY_CENTER): Payer: Medicare Other | Admitting: Gastroenterology

## 2020-10-01 ENCOUNTER — Other Ambulatory Visit: Payer: Self-pay

## 2020-10-01 VITALS — BP 152/77 | HR 51 | Temp 97.7°F | Resp 20 | Ht 73.0 in | Wt 240.0 lb

## 2020-10-01 DIAGNOSIS — Z1211 Encounter for screening for malignant neoplasm of colon: Secondary | ICD-10-CM

## 2020-10-01 DIAGNOSIS — D120 Benign neoplasm of cecum: Secondary | ICD-10-CM | POA: Diagnosis not present

## 2020-10-01 MED ORDER — SODIUM CHLORIDE 0.9 % IV SOLN: 500.0000 mL | Freq: Once | INTRAVENOUS | Status: DC

## 2020-10-01 NOTE — Op Note (Signed)
Daniel Whitaker Patient Name: Daniel Whitaker Procedure Date: 10/01/2020 11:55 AM MRN: [id number] Endoscopist: Justice Britain , MD Age: 75 Referring MD:  Date of Birth: [birth year] Gender: Male Account #: [id number] Procedure:                Colonoscopy Indications:              Screening for colorectal malignant neoplasm Medicines:                Monitored Anesthesia Care Procedure:                Pre-Anesthesia Assessment:                           - Prior to the procedure, a History and Physical                            was performed, and patient medications and                            allergies were reviewed. The patient's tolerance of                            previous anesthesia was also reviewed. The risks                            and benefits of the procedure and the sedation                            options and risks were discussed with the patient.                            All questions were answered, and informed consent                            was obtained. Prior Anticoagulants: The patient has                            taken no previous anticoagulant or antiplatelet                            agents except for aspirin. ASA Grade Assessment:                            III - A patient with severe systemic disease. After                            reviewing the risks and benefits, the patient was                            deemed in satisfactory condition to undergo the                            procedure.  After obtaining informed consent, the colonoscope                            was passed under direct vision. Throughout the                            procedure, the patient's blood pressure, pulse, and                            oxygen saturations were monitored continuously. The                            Olympus CF-HQ190 [id number]) Colonoscope was                            introduced through the anus and advanced to  the the                            cecum, identified by appendiceal orifice and                            ileocecal valve. The colonoscopy was performed                            without difficulty. The patient tolerated the                            procedure. The quality of the bowel preparation was                            adequate. The terminal ileum, ileocecal valve,                            appendiceal orifice, and rectum were photographed. Scope In: 12:04:47 PM Scope Out: 12:23:12 PM Scope Withdrawal Time: 0 hours 12 minutes 17 seconds  Total Procedure Duration: 0 hours 18 minutes 25 seconds  Findings:                 The digital rectal exam findings include                            hemorrhoids. Pertinent negatives include no                            palpable rectal lesions.                           Extensive amounts of liquid and semi-liquid stool                            was found in the entire colon, interfering with                            visualization. Lavage of the area was performed  using copious amounts, resulting in clearance with                            adequate visualization.                           The terminal ileum and ileocecal valve appeared                            normal.                           A 3 mm polyp was found in the cecum. The polyp was                            sessile. The polyp was removed with a cold snare.                            Resection and retrieval were complete.                           Normal mucosa was found in the entire colon                            otherwise.                           Non-bleeding non-thrombosed internal hemorrhoids                            were found during retroflexion, during perianal                            exam and during digital exam. The hemorrhoids were                            Grade II (internal hemorrhoids that prolapse but                             reduce spontaneously). Complications:            No immediate complications. Estimated Blood Loss:     Estimated blood loss was minimal. Impression:               - Hemorrhoids found on digital rectal exam.                           - Stool in the entire examined colon - lavaged                            copiously with adequate visualization.                           - The examined portion of the ileum was normal.                           -  One 3 mm polyp in the cecum, removed with a cold                            snare. Resected and retrieved.                           - Normal mucosa in the entire examined colon                            otherwise.                           - Non-bleeding non-thrombosed internal hemorrhoids. Recommendation:           - The patient will be observed post-procedure,                            until all discharge criteria are met.                           - Discharge patient to home.                           - Patient has a contact number available for                            emergencies. The signs and symptoms of potential                            delayed complications were discussed with the                            patient. Return to normal activities tomorrow.                            Written discharge instructions were provided to the                            patient.                           - High fiber diet.                           - Use FiberCon 1-2 tablets PO daily.                           - Continue present medications.                           - Await pathology results.                           - Repeat colonoscopy in 10/05/08 years for                            surveillance based on pathology  results and                            adenomatous tissue.                           - The findings and recommendations were discussed                            with the patient.                           - The findings  and recommendations were discussed                            with the patient's family. Justice Britain, MD 10/01/2020 12:28:40 PM

## 2020-10-01 NOTE — Progress Notes (Signed)
A/ox3, pleased with MAC, report to RN 

## 2020-10-01 NOTE — Progress Notes (Signed)
Pt's states no medical or surgical changes since previsit or office visit. 

## 2020-10-01 NOTE — Patient Instructions (Signed)
Handouts provided on polyps, hemorrhoids and high-fiber diet.   Recommend a High-fiber diet.  Use FiberCon 1-2 tablets by mouth daily.   YOU HAD AN ENDOSCOPIC PROCEDURE TODAY AT [facility]:   Refer to the procedure report that was given to you for any specific questions about what was found during the examination.  If the procedure report does not answer your questions, please call your gastroenterologist to clarify.  If you requested that your care partner not be given the details of your procedure findings, then the procedure report has been included in a sealed envelope for you to review at your convenience later.  YOU SHOULD EXPECT: Some feelings of bloating in the abdomen. Passage of more gas than usual.  Walking can help get rid of the air that was put into your GI tract during the procedure and reduce the bloating. If you had a lower endoscopy (such as a colonoscopy or flexible sigmoidoscopy) you may notice spotting of blood in your stool or on the toilet paper. If you underwent a bowel prep for your procedure, you may not have a normal bowel movement for a few days.  Please Note:  You might notice some irritation and congestion in your nose or some drainage.  This is from the oxygen used during your procedure.  There is no need for concern and it should clear up in a day or so.  SYMPTOMS TO REPORT IMMEDIATELY:   Following lower endoscopy (colonoscopy or flexible sigmoidoscopy):  Excessive amounts of blood in the stool  Significant tenderness or worsening of abdominal pains  Swelling of the abdomen that is new, acute  Fever of 100F or higher  For urgent or emergent issues, a gastroenterologist can be reached at any hour by calling [phone]. Do not use MyChart messaging for urgent concerns.    DIET:  We do recommend a small meal at first, but then you may proceed to your regular diet.  Drink plenty of fluids but you should avoid alcoholic beverages for 24  hours.  ACTIVITY:  You should plan to take it easy for the rest of today and you should NOT DRIVE or use heavy machinery until tomorrow (because of the sedation medicines used during the test).    FOLLOW UP: Our staff will call the number listed on your records 48-72 hours following your procedure to check on you and address any questions or concerns that you may have regarding the information given to you following your procedure. If we do not reach you, we will leave a message.  We will attempt to reach you two times.  During this call, we will ask if you have developed any symptoms of COVID 19. If you develop any symptoms (ie: fever, flu-like symptoms, shortness of breath, cough etc.) before then, please call [phone].  If you test positive for Covid 19 in the 2 weeks post procedure, please call and report this information to [place].    If any biopsies were taken you will be contacted by phone or by letter within the next 1-3 weeks.  Please call us at [phone] if you have not heard about the biopsies in 3 weeks.    SIGNATURES/CONFIDENTIALITY: You and/or your care partner have signed paperwork which will be entered into your electronic medical record.  These signatures attest to the fact that that the information above on your After Visit Summary has been reviewed and is understood.  Full responsibility of the confidentiality of this discharge information lies  with you and/or your care-partner.

## 2020-10-01 NOTE — Progress Notes (Signed)
Matthews relieves [employer]

## 2020-10-02 DIAGNOSIS — Z23 Encounter for immunization: Secondary | ICD-10-CM | POA: Diagnosis not present

## 2020-10-02 DIAGNOSIS — K117 Disturbances of salivary secretion: Secondary | ICD-10-CM | POA: Diagnosis not present

## 2020-10-02 DIAGNOSIS — I2510 Atherosclerotic heart disease of native coronary artery without angina pectoris: Secondary | ICD-10-CM | POA: Diagnosis not present

## 2020-10-02 DIAGNOSIS — E782 Mixed hyperlipidemia: Secondary | ICD-10-CM | POA: Diagnosis not present

## 2020-10-03 ENCOUNTER — Telehealth: Payer: Self-pay | Admitting: *Deleted

## 2020-10-03 NOTE — Telephone Encounter (Signed)
Follow up call made. 

## 2020-10-03 NOTE — Telephone Encounter (Signed)
  Follow up Call-  Call back number 10/01/2020  Post procedure Call Back phone  # [phone]  Permission to leave phone message Yes  Some recent data might be hidden     Patient questions:  Do you have a fever, pain , or abdominal swelling? No. Pain Score  0 *  Have you tolerated food without any problems? Yes.    Have you been able to return to your normal activities? Yes.    Do you have any questions about your discharge instructions: Diet   No. Medications  No. Follow up visit  No.  Do you have questions or concerns about your Care? No.  Actions: * If pain score is 4 or above: 1. No action needed, pain <4.Have you developed a fever since your procedure? no  2.   Have you had an respiratory symptoms (SOB or cough) since your procedure? no  3.   Have you tested positive for COVID 19 since your procedure no  4.   Have you had any family members/close contacts diagnosed with the COVID 19 since your procedure?  no   If yes to any of these questions please route to Joylene John, RN and Joella Prince, RN

## 2020-10-09 ENCOUNTER — Encounter: Payer: Self-pay | Admitting: Gastroenterology

## 2020-10-22 DIAGNOSIS — M25531 Pain in right wrist: Secondary | ICD-10-CM | POA: Diagnosis not present

## 2020-10-22 DIAGNOSIS — M654 Radial styloid tenosynovitis [de Quervain]: Secondary | ICD-10-CM | POA: Diagnosis not present

## 2020-11-05 ENCOUNTER — Ambulatory Visit (HOSPITAL_COMMUNITY): Payer: Medicare Other | Attending: Internal Medicine

## 2020-11-05 ENCOUNTER — Other Ambulatory Visit: Payer: Self-pay

## 2020-11-05 DIAGNOSIS — I517 Cardiomegaly: Secondary | ICD-10-CM | POA: Diagnosis not present

## 2020-11-05 LAB — ECHOCARDIOGRAM COMPLETE
Area-P 1/2: 3.5 cm2
S' Lateral: 3.8 cm

## 2020-11-11 ENCOUNTER — Encounter: Payer: Self-pay | Admitting: *Deleted

## 2020-11-20 DIAGNOSIS — M48061 Spinal stenosis, lumbar region without neurogenic claudication: Secondary | ICD-10-CM | POA: Diagnosis not present

## 2020-11-20 DIAGNOSIS — M5416 Radiculopathy, lumbar region: Secondary | ICD-10-CM | POA: Diagnosis not present

## 2020-11-20 DIAGNOSIS — R030 Elevated blood-pressure reading, without diagnosis of hypertension: Secondary | ICD-10-CM | POA: Diagnosis not present

## 2020-11-28 DIAGNOSIS — Z961 Presence of intraocular lens: Secondary | ICD-10-CM | POA: Diagnosis not present

## 2020-11-28 DIAGNOSIS — H401123 Primary open-angle glaucoma, left eye, severe stage: Secondary | ICD-10-CM | POA: Diagnosis not present

## 2020-11-28 DIAGNOSIS — H04123 Dry eye syndrome of bilateral lacrimal glands: Secondary | ICD-10-CM | POA: Diagnosis not present

## 2020-11-28 DIAGNOSIS — H401111 Primary open-angle glaucoma, right eye, mild stage: Secondary | ICD-10-CM | POA: Diagnosis not present

## 2020-11-28 DIAGNOSIS — H1045 Other chronic allergic conjunctivitis: Secondary | ICD-10-CM | POA: Diagnosis not present

## 2020-12-05 DIAGNOSIS — M75111 Incomplete rotator cuff tear or rupture of right shoulder, not specified as traumatic: Secondary | ICD-10-CM | POA: Diagnosis not present

## 2020-12-17 DIAGNOSIS — M25511 Pain in right shoulder: Secondary | ICD-10-CM | POA: Diagnosis not present

## 2020-12-17 DIAGNOSIS — H1045 Other chronic allergic conjunctivitis: Secondary | ICD-10-CM | POA: Diagnosis not present

## 2020-12-17 DIAGNOSIS — H16141 Punctate keratitis, right eye: Secondary | ICD-10-CM | POA: Diagnosis not present

## 2020-12-17 DIAGNOSIS — Z961 Presence of intraocular lens: Secondary | ICD-10-CM | POA: Diagnosis not present

## 2020-12-17 DIAGNOSIS — H04123 Dry eye syndrome of bilateral lacrimal glands: Secondary | ICD-10-CM | POA: Diagnosis not present

## 2020-12-25 DIAGNOSIS — M7541 Impingement syndrome of right shoulder: Secondary | ICD-10-CM | POA: Diagnosis not present

## 2020-12-26 DIAGNOSIS — H16141 Punctate keratitis, right eye: Secondary | ICD-10-CM | POA: Diagnosis not present

## 2021-01-01 DIAGNOSIS — M9903 Segmental and somatic dysfunction of lumbar region: Secondary | ICD-10-CM | POA: Diagnosis not present

## 2021-01-01 DIAGNOSIS — M9902 Segmental and somatic dysfunction of thoracic region: Secondary | ICD-10-CM | POA: Diagnosis not present

## 2021-01-01 DIAGNOSIS — M5134 Other intervertebral disc degeneration, thoracic region: Secondary | ICD-10-CM | POA: Diagnosis not present

## 2021-01-01 DIAGNOSIS — M5136 Other intervertebral disc degeneration, lumbar region: Secondary | ICD-10-CM | POA: Diagnosis not present

## 2021-01-02 DIAGNOSIS — M9903 Segmental and somatic dysfunction of lumbar region: Secondary | ICD-10-CM | POA: Diagnosis not present

## 2021-01-02 DIAGNOSIS — M5134 Other intervertebral disc degeneration, thoracic region: Secondary | ICD-10-CM | POA: Diagnosis not present

## 2021-01-02 DIAGNOSIS — M9902 Segmental and somatic dysfunction of thoracic region: Secondary | ICD-10-CM | POA: Diagnosis not present

## 2021-01-02 DIAGNOSIS — M5136 Other intervertebral disc degeneration, lumbar region: Secondary | ICD-10-CM | POA: Diagnosis not present

## 2021-02-08 DIAGNOSIS — H6121 Impacted cerumen, right ear: Secondary | ICD-10-CM | POA: Diagnosis not present

## 2021-02-11 ENCOUNTER — Other Ambulatory Visit: Payer: Self-pay

## 2021-02-11 ENCOUNTER — Inpatient Hospital Stay: Payer: Medicare Other | Attending: Hematology and Oncology

## 2021-02-11 DIAGNOSIS — D472 Monoclonal gammopathy: Secondary | ICD-10-CM | POA: Diagnosis not present

## 2021-02-11 DIAGNOSIS — Z7982 Long term (current) use of aspirin: Secondary | ICD-10-CM | POA: Diagnosis not present

## 2021-02-11 DIAGNOSIS — Z79899 Other long term (current) drug therapy: Secondary | ICD-10-CM | POA: Insufficient documentation

## 2021-02-11 LAB — COMPREHENSIVE METABOLIC PANEL
ALT: 16 U/L (ref 0–44)
AST: 19 U/L (ref 15–41)
Albumin: 3.7 g/dL (ref 3.5–5.0)
Alkaline Phosphatase: 91 U/L (ref 38–126)
Anion gap: 8 (ref 5–15)
BUN: 20 mg/dL (ref 8–23)
CO2: 23 mmol/L (ref 22–32)
Calcium: 9.3 mg/dL (ref 8.9–10.3)
Chloride: 108 mmol/L (ref 98–111)
Creatinine, Ser: 0.9 mg/dL (ref 0.61–1.24)
GFR, Estimated: >60 mL/min (ref >60)
Glucose, Bld: 108 mg/dL — ABNORMAL HIGH (ref 70–99)
Potassium: 4.3 mmol/L (ref 3.5–5.1)
Sodium: 139 mmol/L (ref 135–145)
Total Bilirubin: 0.4 mg/dL (ref 0.3–1.2)
Total Protein: 6.6 g/dL (ref 6.5–8.1)

## 2021-02-11 LAB — CBC WITH DIFFERENTIAL/PLATELET
Abs Immature Granulocytes: 0.02 10*3/uL (ref 0.00–0.07)
Basophils Absolute: 0.1 10*3/uL (ref 0.0–0.1)
Basophils Relative: 1 %
Eosinophils Absolute: 0.3 10*3/uL (ref 0.0–0.5)
Eosinophils Relative: 4 %
HCT: 37.8 % — ABNORMAL LOW (ref 39.0–52.0)
Hemoglobin: 13 g/dL (ref 13.0–17.0)
Immature Granulocytes: 0 %
Lymphocytes Relative: 29 %
Lymphs Abs: 2 10*3/uL (ref 0.7–4.0)
MCH: 32 pg (ref 26.0–34.0)
MCHC: 34.4 g/dL (ref 30.0–36.0)
MCV: 93.1 fL (ref 80.0–100.0)
Monocytes Absolute: 0.5 10*3/uL (ref 0.1–1.0)
Monocytes Relative: 8 %
Neutro Abs: 4 10*3/uL (ref 1.7–7.7)
Neutrophils Relative %: 58 %
Platelets: 195 10*3/uL (ref 150–400)
RBC: 4.06 MIL/uL — ABNORMAL LOW (ref 4.22–5.81)
RDW: 12.5 % (ref 11.5–15.5)
WBC: 6.9 10*3/uL (ref 4.0–10.5)
nRBC: 0 % (ref 0.0–0.2)

## 2021-02-12 LAB — KAPPA/LAMBDA LIGHT CHAINS
Kappa free light chain: 52.8 mg/L — ABNORMAL HIGH (ref 3.3–19.4)
Kappa, lambda light chain ratio: 6.86 — ABNORMAL HIGH (ref 0.26–1.65)
Lambda free light chains: 7.7 mg/L (ref 5.7–26.3)

## 2021-02-13 ENCOUNTER — Other Ambulatory Visit: Payer: Medicare Other

## 2021-02-17 LAB — MULTIPLE MYELOMA PANEL, SERUM
Albumin SerPl Elph-Mcnc: 3.7 g/dL (ref 2.9–4.4)
Albumin/Glob SerPl: 1.5 (ref 0.7–1.7)
Alpha 1: 0.2 g/dL (ref 0.0–0.4)
Alpha2 Glob SerPl Elph-Mcnc: 0.6 g/dL (ref 0.4–1.0)
B-Globulin SerPl Elph-Mcnc: 0.8 g/dL (ref 0.7–1.3)
Gamma Glob SerPl Elph-Mcnc: 0.9 g/dL (ref 0.4–1.8)
Globulin, Total: 2.5 g/dL (ref 2.2–3.9)
IgA: 425 mg/dL (ref 61–437)
IgG (Immunoglobin G), Serum: 576 mg/dL — ABNORMAL LOW (ref 603–1613)
IgM (Immunoglobulin M), Srm: 47 mg/dL (ref 15–143)
M Protein SerPl Elph-Mcnc: 0.4 g/dL — ABNORMAL HIGH
Total Protein ELP: 6.2 g/dL (ref 6.0–8.5)

## 2021-02-18 ENCOUNTER — Ambulatory Visit: Payer: Medicare Other | Admitting: Hematology and Oncology

## 2021-02-20 ENCOUNTER — Ambulatory Visit: Payer: Medicare Other | Admitting: Hematology and Oncology

## 2021-03-03 ENCOUNTER — Other Ambulatory Visit: Payer: Self-pay | Admitting: Neurology

## 2021-03-04 ENCOUNTER — Inpatient Hospital Stay: Payer: Medicare Other | Admitting: Hematology and Oncology

## 2021-03-04 ENCOUNTER — Telehealth: Payer: Self-pay

## 2021-03-04 NOTE — Telephone Encounter (Signed)
Called regarding missed appt today. He states that he called last week to cancel due to having covid. He is in a meeting now and will call back to reschedule.

## 2021-03-11 ENCOUNTER — Ambulatory Visit: Payer: Medicare Other | Admitting: Neurology

## 2021-03-11 ENCOUNTER — Encounter: Payer: Self-pay | Admitting: Neurology

## 2021-03-11 VITALS — BP 136/80 | HR 64 | Ht 73.0 in | Wt 253.6 lb

## 2021-03-11 DIAGNOSIS — D472 Monoclonal gammopathy: Secondary | ICD-10-CM | POA: Diagnosis not present

## 2021-03-11 DIAGNOSIS — G629 Polyneuropathy, unspecified: Secondary | ICD-10-CM

## 2021-03-11 DIAGNOSIS — G4762 Sleep related leg cramps: Secondary | ICD-10-CM | POA: Diagnosis not present

## 2021-03-11 HISTORY — DX: Sleep related leg cramps: G47.62

## 2021-03-11 NOTE — Progress Notes (Signed)
Reason for visit: Peripheral neuropathy, MGUS  Daniel Whitaker is an 74 y.o. male  History of present illness:  Daniel Whitaker is a 75 year old right-handed white male with a history of an IgA monoclonal antibody, and a peripheral neuropathy.  The patient reports numbness of the distal portions of the feet bilaterally.  He has restless leg syndrome and occasional nocturnal leg cramps.  If he is particularly active during the day he may have some increased discomfort in the feet but usually he does not have a lot of pain.  He is on Lyrica taking 200 mg twice daily.  He takes Requip 2 mg at night for restless legs which seems to be fairly effective for this.  He does report some mild gait instability, he has not had any falls.  He plays golf on a regular basis.  He returns to this office for an evaluation.  Past Medical History:  Diagnosis Date   Arthritis    Cataract    removed bilaterally    Erectile dysfunction    Esophageal reflux    Glaucoma    Hypertriglyceridemia    Impaired fasting glucose    MGUS (monoclonal gammopathy of unknown significance)    Neuropathy associated with MGUS ([place]) 07/11/2013   Nontoxic uninodular goiter    Obesity    Peripheral neuropathy    RLS (restless legs syndrome)    Spinal stenosis    Unspecified deficiency anemia 07/11/2013    Past Surgical History:  Procedure Laterality Date   COLON RESECTION N/A 04/21/2013   Procedure: DIAGNOSTIC LAPAROSCOPY, lysis of adhesions for partial small bowel obstruction;  Surgeon: Odis Hollingshead, MD;  Location: [facility];  Service: General;  Laterality: N/A;   COLON SURGERY     partial SBO    COLONOSCOPY     KNEE SURGERY     Arthroscopic   SMALL INTESTINE SURGERY     Blockage    Family History  Problem Relation Age of Onset   Brain cancer Mother    Lung cancer Father    Heart disease Brother        No details   Dementia Brother    Neuropathy Neg Hx    Colon cancer Neg Hx    Colon polyps Neg Hx     Esophageal cancer Neg Hx    Rectal cancer Neg Hx    Stomach cancer Neg Hx     Social history:  reports that he quit smoking about 36 years ago. His smoking use included cigarettes. He has never used smokeless tobacco. He reports current alcohol use of about 6.0 - 8.0 standard drinks per week. He reports that he does not use drugs.    Allergies  Allergen Reactions   Cymbalta [Duloxetine Hcl]     Drowsiness, nausea   Penicillins Hives    Medications:  Prior to Admission medications   Medication Sig Start Date End Date Taking? Authorizing Provider  aspirin EC 81 MG tablet Take 81 mg by mouth daily.   Yes [provider]  brimonidine (ALPHAGAN) 0.2 % ophthalmic solution Place 1 drop into both eyes in the morning and at bedtime. 01/08/20  Yes [provider]  cetirizine (ZYRTEC) 10 MG tablet Take 10 mg by mouth daily.   Yes [provider]  Cholecalciferol (VITAMIN D) 2000 UNITS CAPS Take 2,000 Units by mouth.   Yes [provider]  Cyanocobalamin (VITAMIN B-12 PO) Take 2 tablets by mouth daily.    Yes [provider]  dorzolamide-timolol (COSOPT) 22.3-6.8 MG/ML ophthalmic solution Place 1 drop into the left eye 2 (two) times daily. 03/09/18  Yes [provider]  ketoconazole (NIZORAL) 2 % cream  11/28/14  Yes [provider]  latanoprost (XALATAN) 0.005 % ophthalmic solution  03/01/13  Yes [provider]  lidocaine (XYLOCAINE) 5 % ointment Apply 1 application topically 2 (two) times daily as needed. 07/17/13  Yes Drema Dallas, DO  meloxicam (MOBIC) 15 MG tablet Take 15 mg by mouth daily.  04/03/13  Yes [provider]  multivitamin-iron-minerals-folic acid (CENTRUM) chewable tablet Chew 1 tablet by mouth daily.   Yes [provider]  Omega-3 Fatty Acids (FISH OIL) 1000 MG CAPS Take 1,000 mg by mouth daily. 3   Yes [provider]  pantoprazole (PROTONIX) 40 MG tablet Take 40 mg by mouth daily.   03/14/13  Yes [provider]  pregabalin (LYRICA) 200 MG capsule TAKE 1 CAPSULE BY MOUTH TWICE DAILY 03/03/21  Yes Kathrynn Ducking, MD  rOPINIRole (REQUIP) 2 MG tablet Take 1 tablet (2 mg total) by mouth at bedtime. 08/28/20  Yes Kathrynn Ducking, MD  sildenafil (REVATIO) 20 MG tablet Take 20-100 mg by mouth as needed.   Yes [provider]  atorvastatin (LIPITOR) 10 MG tablet Take 10 mg by mouth daily. Patient not taking: Reported on 03/11/2021 04/10/20   [provider]    ROS:  Out of a complete 14 system review of symptoms, the patient complains only of the following symptoms, and all other reviewed systems are negative.  Numbness Walking difficulty Leg cramps  Blood pressure 136/80, pulse 64, height 6\' 1"  (1.854 m), weight 253 lb 9.6 oz (115 kg).  Physical Exam  General: The patient is alert and cooperative at the time of the examination.  Skin: Slight bilateral ankle edema was noted.   Neurologic Exam  Mental status: The patient is alert and oriented x 3 at the time of the examination. The patient has apparent normal recent and remote memory, with an apparently normal attention span and concentration ability.   Cranial nerves: Facial symmetry is present. Speech is normal, no aphasia or dysarthria is noted. Extraocular movements are full. Visual fields are full.  Motor: The patient has good strength in all 4 extremities.  Sensory examination: Soft touch sensation is symmetric on the face, arms, and legs.  Coordination: The patient has good finger-nose-finger and heel-to-shin bilaterally.  Gait and station: The patient has a normal gait. Tandem gait is minimally unsteady. Romberg is negative.  The patient is able to walk on heels and the toes bilaterally.  No drift is seen.  Reflexes: Deep tendon reflexes are symmetric.   Assessment/Plan:  1.  Peripheral neuropathy  2. MGUS, IgA  The patient will remain on the current dose of Requip  and Lyrica.  I have recommended magnesium supplementation for the nocturnal leg cramps.  He will follow-up here in 6 months, in the future he can be followed through Dr. Krista Blue.  Jill Alexanders MD 03/11/2021 12:37 PM  [facility] [address] [place], [place] [address]  Phone [phone] Fax [phone]

## 2021-03-20 ENCOUNTER — Encounter: Payer: Self-pay | Admitting: Hematology and Oncology

## 2021-03-20 ENCOUNTER — Inpatient Hospital Stay: Payer: Medicare Other | Attending: Hematology and Oncology | Admitting: Hematology and Oncology

## 2021-03-20 ENCOUNTER — Other Ambulatory Visit: Payer: Self-pay

## 2021-03-20 ENCOUNTER — Ambulatory Visit: Payer: Medicare Other | Admitting: Hematology and Oncology

## 2021-03-20 DIAGNOSIS — D472 Monoclonal gammopathy: Secondary | ICD-10-CM

## 2021-03-20 DIAGNOSIS — G629 Polyneuropathy, unspecified: Secondary | ICD-10-CM | POA: Diagnosis not present

## 2021-03-20 DIAGNOSIS — E6609 Other obesity due to excess calories: Secondary | ICD-10-CM | POA: Insufficient documentation

## 2021-03-20 NOTE — Progress Notes (Signed)
[facility] OFFICE PROGRESS NOTE  Patient Care Team: [name], MD as PCP - General (Family Medicine) Minus Breeding, MD as PCP - Cardiology (Cardiology)  ASSESSMENT & PLAN:  MGUS (monoclonal gammopathy of unknown significance) Clinically, he has no presence of disease progression. I plan to see him on a yearly basis with history, physical examination and blood work.   Neuropathy I suspect this is related to chronic alcohol intake and not MGUS He will continue taking Lyrica for this I will defer to his neurologist for further management   No orders of the defined types were placed in this encounter.   All questions were answered. The patient knows to call the clinic with any problems, questions or concerns. The total time spent in the appointment was 20 minutes encounter with patients including review of chart and various tests results, discussions about plan of care and coordination of care plan   Heath Lark, MD 03/20/2021 4:41 PM  INTERVAL HISTORY: Please see below for problem oriented charting. he returns for MGUS follow-up on surveillance Since last time I saw him, he is stable No recent infection He continues to have peripheral neuropathy but stable He continues to drink alcohol intermittently  REVIEW OF SYSTEMS:   Constitutional: Denies fevers, chills or abnormal weight loss Eyes: Denies blurriness of vision Ears, nose, mouth, throat, and face: Denies mucositis or sore throat Respiratory: Denies cough, dyspnea or wheezes Cardiovascular: Denies palpitation, chest discomfort or lower extremity swelling Gastrointestinal:  Denies nausea, heartburn or change in bowel habits Skin: Denies abnormal skin rashes Lymphatics: Denies new lymphadenopathy or easy bruising Behavioral/Psych: Mood is stable, no new changes  All other systems were reviewed with the patient and are negative.  I have reviewed the past medical history, past surgical history, social  history and family history with the patient and they are unchanged from previous note.  ALLERGIES:  is allergic to cymbalta [duloxetine hcl] and penicillins.  MEDICATIONS:  Current Outpatient Medications  Medication Sig Dispense Refill   aspirin EC 81 MG tablet Take 81 mg by mouth daily.     brimonidine (ALPHAGAN) 0.2 % ophthalmic solution Place 1 drop into both eyes in the morning and at bedtime.     cetirizine (ZYRTEC) 10 MG tablet Take 10 mg by mouth daily.     Cholecalciferol (VITAMIN D) 2000 UNITS CAPS Take 2,000 Units by mouth.     Cyanocobalamin (VITAMIN B-12 PO) Take 2 tablets by mouth daily.      dorzolamide-timolol (COSOPT) 22.3-6.8 MG/ML ophthalmic solution Place 1 drop into the left eye 2 (two) times daily.     ketoconazole (NIZORAL) 2 % cream      latanoprost (XALATAN) 0.005 % ophthalmic solution      lidocaine (XYLOCAINE) 5 % ointment Apply 1 application topically 2 (two) times daily as needed. 35.44 g 3   meloxicam (MOBIC) 15 MG tablet Take 15 mg by mouth daily.      multivitamin-iron-minerals-folic acid (CENTRUM) chewable tablet Chew 1 tablet by mouth daily.     Omega-3 Fatty Acids (FISH OIL) 1000 MG CAPS Take 1,000 mg by mouth daily. 3     pantoprazole (PROTONIX) 40 MG tablet Take 40 mg by mouth daily.      pregabalin (LYRICA) 200 MG capsule TAKE 1 CAPSULE BY MOUTH TWICE DAILY 180 capsule 1   rOPINIRole (REQUIP) 2 MG tablet Take 1 tablet (2 mg total) by mouth at bedtime. 90 tablet 3   sildenafil (REVATIO) 20 MG tablet Take 20-100 mg  by mouth as needed.     No current facility-administered medications for this visit.    SUMMARY OF ONCOLOGIC HISTORY:  This is a pleasant patient with severe peripheral neuropathy was referred here for evaluation of abnormal serum protein electrophoresis. Further studies revealed monoclonal gammopathy of unknown significance that is not the cause of his peripheral neuropathy, IgA kappa subtype. The peripheral neuropathy was thought to be  related to alcoholism. He is being observed.  PHYSICAL EXAMINATION: ECOG PERFORMANCE STATUS: 1 - Symptomatic but completely ambulatory  Vitals:   03/20/21 0814  BP: (!) 169/70  Pulse: 72  Resp: 18  Temp: (!) 97.4 F (36.3 C)  SpO2: 97%   Filed Weights   03/20/21 0814  Weight: 260 lb (117.9 kg)    GENERAL:alert, no distress and comfortable SKIN: skin color, texture, turgor are normal, no rashes or significant lesions EYES: normal, Conjunctiva are pink and non-injected, sclera clear OROPHARYNX:no exudate, no erythema and lips, buccal mucosa, and tongue normal  NECK: supple, thyroid normal size, non-tender, without nodularity LYMPH:  no palpable lymphadenopathy in the cervical, axillary or inguinal LUNGS: clear to auscultation and percussion with normal breathing effort HEART: regular rate & rhythm and no murmurs and no lower extremity edema ABDOMEN:abdomen soft, non-tender and normal bowel sounds Musculoskeletal:no cyanosis of digits and no clubbing  NEURO: alert & oriented x 3 with fluent speech, no focal motor/sensory deficits  LABORATORY DATA:  I have reviewed the data as listed    Component Value Date/Time   NA 139 02/11/2021 0919   NA 139 01/12/2017 1011   K 4.3 02/11/2021 0919   K 4.3 01/12/2017 1011   CL 108 02/11/2021 0919   CO2 23 02/11/2021 0919   CO2 23 01/12/2017 1011   GLUCOSE 108 (H) 02/11/2021 0919   GLUCOSE 98 01/12/2017 1011   BUN 20 02/11/2021 0919   BUN 21.8 01/12/2017 1011   CREATININE 0.90 02/11/2021 0919   CREATININE 0.9 01/12/2017 1011   CALCIUM 9.3 02/11/2021 0919   CALCIUM 9.0 01/12/2017 1011   PROT 6.6 02/11/2021 0919   PROT 6.4 01/12/2017 1011   PROT 6.1 01/12/2017 1011   ALBUMIN 3.7 02/11/2021 0919   ALBUMIN 3.6 01/12/2017 1011   AST 19 02/11/2021 0919   AST 20 01/12/2017 1011   ALT 16 02/11/2021 0919   ALT 18 01/12/2017 1011   ALKPHOS 91 02/11/2021 0919   ALKPHOS 81 01/12/2017 1011   BILITOT 0.4 02/11/2021 0919   BILITOT 0.58  01/12/2017 1011   GFRNONAA >60 02/11/2021 0919   GFRAA >60 02/14/2020 1115    No results found for: SPEP, UPEP  Lab Results  Component Value Date   WBC 6.9 02/11/2021   NEUTROABS 4.0 02/11/2021   HGB 13.0 02/11/2021   HCT 37.8 (L) 02/11/2021   MCV 93.1 02/11/2021   PLT 195 02/11/2021      Chemistry      Component Value Date/Time   NA 139 02/11/2021 0919   NA 139 01/12/2017 1011   K 4.3 02/11/2021 0919   K 4.3 01/12/2017 1011   CL 108 02/11/2021 0919   CO2 23 02/11/2021 0919   CO2 23 01/12/2017 1011   BUN 20 02/11/2021 0919   BUN 21.8 01/12/2017 1011   CREATININE 0.90 02/11/2021 0919   CREATININE 0.9 01/12/2017 1011      Component Value Date/Time   CALCIUM 9.3 02/11/2021 0919   CALCIUM 9.0 01/12/2017 1011   ALKPHOS 91 02/11/2021 0919   ALKPHOS 81 01/12/2017 1011  AST 19 02/11/2021 0919   AST 20 01/12/2017 1011   ALT 16 02/11/2021 0919   ALT 18 01/12/2017 1011   BILITOT 0.4 02/11/2021 0919   BILITOT 0.58 01/12/2017 1011

## 2021-03-20 NOTE — Assessment & Plan Note (Signed)
Clinically, he has no presence of disease progression. I plan to see him on a yearly basis with history, physical examination and blood work.

## 2021-03-20 NOTE — Assessment & Plan Note (Signed)
I suspect this is related to chronic alcohol intake and not MGUS He will continue taking Lyrica for this I will defer to his neurologist for further management

## 2021-04-03 DIAGNOSIS — M25562 Pain in left knee: Secondary | ICD-10-CM | POA: Diagnosis not present

## 2021-04-09 ENCOUNTER — Ambulatory Visit: Payer: Medicare Other | Admitting: Adult Health

## 2021-04-14 DIAGNOSIS — Z0000 Encounter for general adult medical examination without abnormal findings: Secondary | ICD-10-CM | POA: Diagnosis not present

## 2021-04-14 DIAGNOSIS — M4800 Spinal stenosis, site unspecified: Secondary | ICD-10-CM | POA: Diagnosis not present

## 2021-04-14 DIAGNOSIS — R350 Frequency of micturition: Secondary | ICD-10-CM | POA: Diagnosis not present

## 2021-04-14 DIAGNOSIS — E041 Nontoxic single thyroid nodule: Secondary | ICD-10-CM | POA: Diagnosis not present

## 2021-04-14 DIAGNOSIS — G629 Polyneuropathy, unspecified: Secondary | ICD-10-CM | POA: Diagnosis not present

## 2021-04-14 DIAGNOSIS — G2581 Restless legs syndrome: Secondary | ICD-10-CM | POA: Diagnosis not present

## 2021-04-14 DIAGNOSIS — K219 Gastro-esophageal reflux disease without esophagitis: Secondary | ICD-10-CM | POA: Diagnosis not present

## 2021-04-14 DIAGNOSIS — E782 Mixed hyperlipidemia: Secondary | ICD-10-CM | POA: Diagnosis not present

## 2021-04-14 DIAGNOSIS — E559 Vitamin D deficiency, unspecified: Secondary | ICD-10-CM | POA: Diagnosis not present

## 2021-04-14 DIAGNOSIS — R7309 Other abnormal glucose: Secondary | ICD-10-CM | POA: Diagnosis not present

## 2021-04-14 DIAGNOSIS — D472 Monoclonal gammopathy: Secondary | ICD-10-CM | POA: Diagnosis not present

## 2021-04-14 DIAGNOSIS — I2510 Atherosclerotic heart disease of native coronary artery without angina pectoris: Secondary | ICD-10-CM | POA: Diagnosis not present

## 2021-04-14 DIAGNOSIS — Z23 Encounter for immunization: Secondary | ICD-10-CM | POA: Diagnosis not present

## 2021-04-21 ENCOUNTER — Other Ambulatory Visit: Payer: Self-pay | Admitting: Orthopedic Surgery

## 2021-04-21 DIAGNOSIS — M25562 Pain in left knee: Secondary | ICD-10-CM

## 2021-04-30 DIAGNOSIS — M5416 Radiculopathy, lumbar region: Secondary | ICD-10-CM | POA: Diagnosis not present

## 2021-05-09 ENCOUNTER — Encounter: Payer: Self-pay | Admitting: Adult Health

## 2021-05-12 MED ORDER — LIDOCAINE 5 % EX OINT: 1.0000 "application " | TOPICAL_OINTMENT | Freq: Two times a day (BID) | CUTANEOUS | 3 refills | Status: DC | PRN

## 2021-05-12 NOTE — Addendum Note (Signed)
Addended by: Trudie Buckler on: 05/12/2021 02:03 PM   Modules accepted: Orders

## 2021-05-18 ENCOUNTER — Ambulatory Visit
Admission: RE | Admit: 2021-05-18 | Discharge: 2021-05-18 | Disposition: A | Discharge status: Home / Self Care | Payer: Medicare Other | Source: Ambulatory Visit | Attending: Orthopedic Surgery | Admitting: Orthopedic Surgery

## 2021-05-18 DIAGNOSIS — R531 Weakness: Secondary | ICD-10-CM | POA: Diagnosis not present

## 2021-05-18 DIAGNOSIS — S83282A Other tear of lateral meniscus, current injury, left knee, initial encounter: Secondary | ICD-10-CM | POA: Diagnosis not present

## 2021-05-18 DIAGNOSIS — M25562 Pain in left knee: Secondary | ICD-10-CM

## 2021-05-22 DIAGNOSIS — M25562 Pain in left knee: Secondary | ICD-10-CM | POA: Diagnosis not present

## 2021-06-06 DIAGNOSIS — M67921 Unspecified disorder of synovium and tendon, right upper arm: Secondary | ICD-10-CM | POA: Diagnosis not present

## 2021-06-06 DIAGNOSIS — M79601 Pain in right arm: Secondary | ICD-10-CM | POA: Diagnosis not present

## 2021-06-09 DIAGNOSIS — M25511 Pain in right shoulder: Secondary | ICD-10-CM | POA: Diagnosis not present

## 2021-06-11 DIAGNOSIS — H04123 Dry eye syndrome of bilateral lacrimal glands: Secondary | ICD-10-CM | POA: Diagnosis not present

## 2021-06-11 DIAGNOSIS — H401123 Primary open-angle glaucoma, left eye, severe stage: Secondary | ICD-10-CM | POA: Diagnosis not present

## 2021-06-11 DIAGNOSIS — H26493 Other secondary cataract, bilateral: Secondary | ICD-10-CM | POA: Diagnosis not present

## 2021-06-11 DIAGNOSIS — H1045 Other chronic allergic conjunctivitis: Secondary | ICD-10-CM | POA: Diagnosis not present

## 2021-06-11 DIAGNOSIS — H43391 Other vitreous opacities, right eye: Secondary | ICD-10-CM | POA: Diagnosis not present

## 2021-06-11 DIAGNOSIS — Z9889 Other specified postprocedural states: Secondary | ICD-10-CM | POA: Diagnosis not present

## 2021-06-11 DIAGNOSIS — Z961 Presence of intraocular lens: Secondary | ICD-10-CM | POA: Diagnosis not present

## 2021-06-11 DIAGNOSIS — H401111 Primary open-angle glaucoma, right eye, mild stage: Secondary | ICD-10-CM | POA: Diagnosis not present

## 2021-06-13 DIAGNOSIS — S46111D Strain of muscle, fascia and tendon of long head of biceps, right arm, subsequent encounter: Secondary | ICD-10-CM | POA: Diagnosis not present

## 2021-06-13 DIAGNOSIS — M7541 Impingement syndrome of right shoulder: Secondary | ICD-10-CM | POA: Diagnosis not present

## 2021-07-01 DIAGNOSIS — M67813 Other specified disorders of tendon, right shoulder: Secondary | ICD-10-CM | POA: Diagnosis not present

## 2021-07-01 DIAGNOSIS — M7541 Impingement syndrome of right shoulder: Secondary | ICD-10-CM | POA: Diagnosis not present

## 2021-07-01 DIAGNOSIS — S43431A Superior glenoid labrum lesion of right shoulder, initial encounter: Secondary | ICD-10-CM | POA: Diagnosis not present

## 2021-07-01 DIAGNOSIS — G8918 Other acute postprocedural pain: Secondary | ICD-10-CM | POA: Diagnosis not present

## 2021-07-01 DIAGNOSIS — M7521 Bicipital tendinitis, right shoulder: Secondary | ICD-10-CM | POA: Diagnosis not present

## 2021-07-01 DIAGNOSIS — M19011 Primary osteoarthritis, right shoulder: Secondary | ICD-10-CM | POA: Diagnosis not present

## 2021-07-14 DIAGNOSIS — M25511 Pain in right shoulder: Secondary | ICD-10-CM | POA: Diagnosis not present

## 2021-07-18 DIAGNOSIS — M25511 Pain in right shoulder: Secondary | ICD-10-CM | POA: Diagnosis not present

## 2021-07-21 DIAGNOSIS — M25511 Pain in right shoulder: Secondary | ICD-10-CM | POA: Diagnosis not present

## 2021-07-24 DIAGNOSIS — M25511 Pain in right shoulder: Secondary | ICD-10-CM | POA: Diagnosis not present

## 2021-07-29 DIAGNOSIS — M25511 Pain in right shoulder: Secondary | ICD-10-CM | POA: Diagnosis not present

## 2021-07-31 DIAGNOSIS — M25511 Pain in right shoulder: Secondary | ICD-10-CM | POA: Diagnosis not present

## 2021-08-14 DIAGNOSIS — J069 Acute upper respiratory infection, unspecified: Secondary | ICD-10-CM | POA: Diagnosis not present

## 2021-08-14 DIAGNOSIS — J40 Bronchitis, not specified as acute or chronic: Secondary | ICD-10-CM | POA: Diagnosis not present

## 2021-08-14 DIAGNOSIS — R059 Cough, unspecified: Secondary | ICD-10-CM | POA: Diagnosis not present

## 2021-08-14 DIAGNOSIS — R062 Wheezing: Secondary | ICD-10-CM | POA: Diagnosis not present

## 2021-09-01 ENCOUNTER — Other Ambulatory Visit: Payer: Self-pay | Admitting: *Deleted

## 2021-09-01 MED ORDER — PREGABALIN 200 MG PO CAPS: 200.0000 mg | ORAL_CAPSULE | Freq: Two times a day (BID) | ORAL | 1 refills | Status: DC

## 2021-09-01 MED ORDER — ROPINIROLE HCL 2 MG PO TABS: 2.0000 mg | ORAL_TABLET | Freq: Every day | ORAL | 3 refills | Status: DC

## 2021-09-12 ENCOUNTER — Encounter: Payer: Self-pay | Admitting: Cardiology

## 2021-09-15 ENCOUNTER — Encounter: Payer: Self-pay | Admitting: Adult Health

## 2021-09-15 ENCOUNTER — Ambulatory Visit: Payer: Medicare Other | Admitting: Adult Health

## 2021-09-15 VITALS — BP 155/81 | HR 68 | Ht 73.0 in | Wt 252.2 lb

## 2021-09-15 DIAGNOSIS — K117 Disturbances of salivary secretion: Secondary | ICD-10-CM

## 2021-09-15 DIAGNOSIS — G609 Hereditary and idiopathic neuropathy, unspecified: Secondary | ICD-10-CM | POA: Diagnosis not present

## 2021-09-15 DIAGNOSIS — G2581 Restless legs syndrome: Secondary | ICD-10-CM | POA: Diagnosis not present

## 2021-09-15 DIAGNOSIS — D472 Monoclonal gammopathy: Secondary | ICD-10-CM | POA: Diagnosis not present

## 2021-09-15 MED ORDER — ROPINIROLE HCL 2 MG PO TABS: ORAL_TABLET | ORAL | 3 refills | Status: DC

## 2021-09-15 NOTE — Progress Notes (Signed)
? ? ?PATIENT: Daniel Whitaker ?DOB: August 17, 1945 ? ?REASON FOR VISIT: follow up ?HISTORY FROM: patient ?PRIMARY NEUROLOGIST: Dr. Krista Blue ? ?Chief Complaint  ?Patient presents with  ? Follow-up  ?  Pt in 61 ?  ? Migraine  ?  Pt in 19  pt is here neuropathy pain follow up Pt states neuropathy is worse Pt states at the bottom of his feet feel dead. Pt states left foot is worse  Pt states he has burning and tingling in both feet  pt states he has RLS in both legs   ? ? ? ?HISTORY OF PRESENT ILLNESS: ?Today 09/15/21: ?[name] is a 76 year old male with a history of peripheral neuropathy and Mgus.  He returns today for follow-up. ? ?Feels that RLS is worse. Takes Requip at dinner time. Soon as he sits down around 6-7 PM it starts. Sometimes it helps other times it doesn't.  ? ?Continues on Lyrica 200 mg BID- feels that it might be slightly worse. Bottom of left foot feels dead. Not unbearable. Feels sharp shooting pain at times.uses lidocaine cream with good benefit. Denies any changes in gait. Feels that balance might be worse. Hits the end of his toes when going up steps.  ? ?Reports that he drools at the right side of his mouth.  He addressed this with his PCP and they mention that we could do Botox for that.  The patient is interested. ? ?HISTORY Daniel Whitaker is a 76 year old right-handed white male with a history of an IgA monoclonal antibody, and a peripheral neuropathy.  The patient reports numbness of the distal portions of the feet bilaterally.  He has restless leg syndrome and occasional nocturnal leg cramps.  If he is particularly active during the day he may have some increased discomfort in the feet but usually he does not have a lot of pain.  He is on Lyrica taking 200 mg twice daily.  He takes Requip 2 mg at night for restless legs which seems to be fairly effective for this.  He does report some mild gait instability, he has not had any falls.  He plays golf on a regular basis.  He returns to this office for  an evaluation. ? ?REVIEW OF SYSTEMS: Out of a complete 14 system review of symptoms, the patient complains only of the following symptoms, and all other reviewed systems are negative. ? ?ALLERGIES: ?Allergies  ?Allergen Reactions  ? Cymbalta [Duloxetine Hcl]   ?  Drowsiness, nausea  ? Penicillins Hives  ? ? ?HOME MEDICATIONS: ?Outpatient Medications Prior to Visit  ?Medication Sig Dispense Refill  ? aspirin EC 81 MG tablet Take 81 mg by mouth daily.    ? brimonidine (ALPHAGAN) 0.2 % ophthalmic solution Place 1 drop into both eyes in the morning and at bedtime.    ? cetirizine (ZYRTEC) 10 MG tablet Take 10 mg by mouth daily.    ? Cholecalciferol (VITAMIN D) 2000 UNITS CAPS Take 2,000 Units by mouth.    ? Cyanocobalamin (VITAMIN B-12 PO) Take 2 tablets by mouth daily.     ? dorzolamide-timolol (COSOPT) 22.3-6.8 MG/ML ophthalmic solution Place 1 drop into the left eye 2 (two) times daily.    ? ketoconazole (NIZORAL) 2 % cream     ? latanoprost (XALATAN) 0.005 % ophthalmic solution     ? lidocaine (XYLOCAINE) 5 % ointment Apply 1 application topically 2 (two) times daily as needed. 35.44 g 3  ? MAGNESIUM PO Take by mouth daily.    ?  meloxicam (MOBIC) 15 MG tablet Take 15 mg by mouth daily.     ? multivitamin-iron-minerals-folic acid (CENTRUM) chewable tablet Chew 1 tablet by mouth daily.    ? Omega-3 Fatty Acids (FISH OIL) 1000 MG CAPS Take 1,000 mg by mouth daily. 3    ? pantoprazole (PROTONIX) 40 MG tablet Take 40 mg by mouth daily.     ? pregabalin (LYRICA) 200 MG capsule Take 1 capsule (200 mg total) by mouth 2 (two) times daily. 180 capsule 1  ? rOPINIRole (REQUIP) 2 MG tablet Take 1 tablet (2 mg total) by mouth at bedtime. 90 tablet 3  ? sildenafil (REVATIO) 20 MG tablet Take 20-100 mg by mouth as needed.    ? ?No facility-administered medications prior to visit.  ? ? ?PAST MEDICAL HISTORY: ?Past Medical History:  ?Diagnosis Date  ? Arthritis   ? Cataract   ? removed bilaterally   ? Erectile dysfunction   ?  Esophageal reflux   ? Glaucoma   ? Hypertriglyceridemia   ? Impaired fasting glucose   ? MGUS (monoclonal gammopathy of unknown significance)   ? Neuropathy associated with MGUS ([place]) 07/11/2013  ? Nocturnal leg cramps 03/11/2021  ? Nontoxic uninodular goiter   ? Obesity   ? Peripheral neuropathy   ? RLS (restless legs syndrome)   ? Spinal stenosis   ? Unspecified deficiency anemia 07/11/2013  ? ? ?PAST SURGICAL HISTORY: ?Past Surgical History:  ?Procedure Laterality Date  ? COLON RESECTION N/A 04/21/2013  ? Procedure: DIAGNOSTIC LAPAROSCOPY, lysis of adhesions for partial small bowel obstruction;  Surgeon: Odis Hollingshead, MD;  Location: [facility];  Service: General;  Laterality: N/A;  ? COLON SURGERY    ? partial SBO   ? COLONOSCOPY    ? KNEE SURGERY    ? Arthroscopic  ? SMALL INTESTINE SURGERY    ? Blockage  ? ? ?FAMILY HISTORY: ?Family History  ?Problem Relation Age of Onset  ? Brain cancer Mother   ? Lung cancer Father   ? Heart disease Brother   ?     No details  ? Dementia Brother   ? Neuropathy Neg Hx   ? Colon cancer Neg Hx   ? Colon polyps Neg Hx   ? Esophageal cancer Neg Hx   ? Rectal cancer Neg Hx   ? Stomach cancer Neg Hx   ? ? ?SOCIAL HISTORY: ?Social History  ? ?Socioeconomic History  ? Marital status: Married  ?  Spouse name: Daniel Whitaker  ? Number of children: 2  ? Years of education: GED  ? Highest education level: Not on file  ?Occupational History  ? Occupation: still working  ?Tobacco Use  ? Smoking status: Former  ?  Types: Cigarettes  ?  Quit date: 06/01/1984  ?  Years since quitting: 37.3  ? Smokeless tobacco: Never  ?Substance and Sexual Activity  ? Alcohol use: Yes  ?  Alcohol/week: 17.0 standard drinks  ?  Types: 7 Glasses of wine, 10 Cans of beer per week  ? Drug use: No  ? Sexual activity: Not on file  ?Other Topics Concern  ? Not on file  ?Social History Narrative  ? Patient lives at home with his wife Daniel Whitaker)  ? Retired.   ? Patient has two children.  ? Patient is right-handed.  ? Patient has his  GED.  ? Patient does not drink caffeine.  ? ?Social Determinants of Health  ? ?Financial Resource Strain: Not on file  ?Food Insecurity: Not on file  ?  Transportation Needs: Not on file  ?Physical Activity: Not on file  ?Stress: Not on file  ?Social Connections: Not on file  ?Intimate Partner Violence: Not on file  ? ? ? ? ?PHYSICAL EXAM ? ?Vitals:  ? 09/15/21 1048  ?BP: (!) 155/81  ?Pulse: 68  ?Weight: 252 lb 3.2 oz (114.4 kg)  ?Height: '6\' 1"'$  (1.854 m)  ? ?Body mass index is 33.27 kg/m?. ? ?Generalized: Well developed, in no acute distress  ? ?Neurological examination  ?Mentation: Alert oriented to time, place, history taking. Follows all commands speech and language fluent ?Cranial nerve II-XII: Pupils were equal round reactive to light. Extraocular movements were full, visual field were full on confrontational test. Facial sensation and strength were normal. Uvula tongue midline. Head turning and shoulder shrug  were normal and symmetric. ?Motor: The motor testing reveals 5 over 5 strength of all 4 extremities. Good symmetric motor tone is noted throughout.  ?Sensory: Sensory testing is intact to soft touch on all 4 extremities. No evidence of extinction is noted.  ?Coordination: Cerebellar testing reveals good finger-nose-finger and heel-to-shin bilaterally.  ?Gait and station: Gait is normal.  ?Reflexes: Deep tendon reflexes are symmetric but depressed throughout ? ?DIAGNOSTIC DATA (LABS, IMAGING, TESTING) ?- I reviewed patient records, labs, notes, testing and imaging myself where available. ? ?Lab Results  ?Component Value Date  ? WBC 6.9 02/11/2021  ? HGB 13.0 02/11/2021  ? HCT 37.8 (L) 02/11/2021  ? MCV 93.1 02/11/2021  ? PLT 195 02/11/2021  ? ?   ?Component Value Date/Time  ? NA 139 02/11/2021 0919  ? NA 139 01/12/2017 1011  ? K 4.3 02/11/2021 0919  ? K 4.3 01/12/2017 1011  ? CL 108 02/11/2021 0919  ? CO2 23 02/11/2021 0919  ? CO2 23 01/12/2017 1011  ? GLUCOSE 108 (H) 02/11/2021 0919  ? GLUCOSE 98  01/12/2017 1011  ? BUN 20 02/11/2021 0919  ? BUN 21.8 01/12/2017 1011  ? CREATININE 0.90 02/11/2021 0919  ? CREATININE 0.9 01/12/2017 1011  ? CALCIUM 9.3 02/11/2021 0919  ? CALCIUM 9.0 01/12/2017 1011  ? PROT 6.6 09

## 2021-09-15 NOTE — Progress Notes (Signed)
Chart reviewed, agree above plan ?

## 2021-09-15 NOTE — Patient Instructions (Signed)
Increase Requip 2 mg at 5PM and 1/2 tablet (1 mg) at bedtime. ?Continue Lyrica 200 mg twice a day  ?

## 2021-09-18 DIAGNOSIS — S0501XA Injury of conjunctiva and corneal abrasion without foreign body, right eye, initial encounter: Secondary | ICD-10-CM | POA: Diagnosis not present

## 2021-09-19 DIAGNOSIS — S0501XD Injury of conjunctiva and corneal abrasion without foreign body, right eye, subsequent encounter: Secondary | ICD-10-CM | POA: Diagnosis not present

## 2021-09-22 DIAGNOSIS — S0501XD Injury of conjunctiva and corneal abrasion without foreign body, right eye, subsequent encounter: Secondary | ICD-10-CM | POA: Diagnosis not present

## 2021-09-26 ENCOUNTER — Ambulatory Visit: Payer: Medicare Other | Admitting: Cardiology

## 2021-09-29 ENCOUNTER — Encounter: Payer: Self-pay | Admitting: Adult Health

## 2021-09-29 MED ORDER — ROPINIROLE HCL 2 MG PO TABS: ORAL_TABLET | ORAL | 3 refills | Status: DC

## 2021-09-29 NOTE — Telephone Encounter (Signed)
Pt has appt with Dr. Krista Blue in 12-2021, but wanted to start process for BOTOX for sialorrhea.   ?

## 2021-10-01 DIAGNOSIS — S0501XD Injury of conjunctiva and corneal abrasion without foreign body, right eye, subsequent encounter: Secondary | ICD-10-CM | POA: Diagnosis not present

## 2021-10-01 NOTE — Progress Notes (Signed)
?  ?Cardiology Office Note ? ? ?Date:  10/03/2021  ? ?ID:  Daniel Whitaker, DOB 21-Oct-1945, MRN [id number] ? ?PCP:  Daniel Neer, MD  ?Cardiologist:   Daniel Breeding, MD ? ? ?Chief Complaint  ?Patient presents with  ? Shortness of Breath  ? ? ?  ?History of Present Illness: ?Daniel Whitaker is a 76 y.o. male who was referred by Daniel Neer, MD for evaluation of DOE.   After my first visit with him I sent him for an echo which was normal and BNP was not elevated.  ?  ?Since I saw him he is back to having the same shortness of breath he was having previously.  He had lost about 15 pounds and was breathing better.  However, he gained that weight back.  He is not short of breath climbing a flight of stairs which is back to where he was.  He is not describing chest pressure, neck or arm discomfort.  He is not having any PND or orthopnea.  He is not having any palpitations, presyncope or syncope.  He did have a sharp shooting pain in in his leg and he points to his right dorsal thigh about a month ago.  This was brief and came back for 5 times but then has not come back in weeks.  He is not having any calf tenderness. ? ? ?Past Medical History:  ?Diagnosis Date  ? Arthritis   ? Cataract   ? removed bilaterally   ? Erectile dysfunction   ? Esophageal reflux   ? Glaucoma   ? Hypertriglyceridemia   ? Impaired fasting glucose   ? MGUS (monoclonal gammopathy of unknown significance)   ? Neuropathy associated with MGUS ([place]) 07/11/2013  ? Nocturnal leg cramps 03/11/2021  ? Nontoxic uninodular goiter   ? Obesity   ? Peripheral neuropathy   ? RLS (restless legs syndrome)   ? Spinal stenosis   ? Unspecified deficiency anemia 07/11/2013  ? ? ?Past Surgical History:  ?Procedure Laterality Date  ? COLON RESECTION N/A 04/21/2013  ? Procedure: DIAGNOSTIC LAPAROSCOPY, lysis of adhesions for partial small bowel obstruction;  Surgeon: Daniel Hollingshead, MD;  Location: [facility];  Service: General;  Laterality: N/A;  ? COLON SURGERY     ? partial SBO   ? COLONOSCOPY    ? KNEE SURGERY    ? Arthroscopic  ? SMALL INTESTINE SURGERY    ? Blockage  ? ? ? ?Current Outpatient Medications  ?Medication Sig Dispense Refill  ? aspirin EC 81 MG tablet Take 81 mg by mouth daily.    ? brimonidine (ALPHAGAN) 0.2 % ophthalmic solution Place 1 drop into both eyes in the morning and at bedtime.    ? cetirizine (ZYRTEC) 10 MG tablet Take 10 mg by mouth daily.    ? Cholecalciferol (VITAMIN D) 2000 UNITS CAPS Take 2,000 Units by mouth.    ? Cyanocobalamin (VITAMIN B-12 PO) Take 2 tablets by mouth daily.     ? dorzolamide-timolol (COSOPT) 22.3-6.8 MG/ML ophthalmic solution Place 1 drop into the left eye 2 (two) times daily.    ? ketoconazole (NIZORAL) 2 % cream     ? latanoprost (XALATAN) 0.005 % ophthalmic solution     ? lidocaine (XYLOCAINE) 5 % ointment Apply 1 application topically 2 (two) times daily as needed. 35.44 g 3  ? MAGNESIUM PO Take by mouth daily.    ? meloxicam (MOBIC) 15 MG tablet Take 15 mg by mouth daily.     ?  multivitamin-iron-minerals-folic acid (CENTRUM) chewable tablet Chew 1 tablet by mouth daily.    ? Omega-3 Fatty Acids (FISH OIL) 1000 MG CAPS Take 1,000 mg by mouth daily. 3    ? pantoprazole (PROTONIX) 40 MG tablet Take 40 mg by mouth daily.     ? pregabalin (LYRICA) 200 MG capsule Take 1 capsule (200 mg total) by mouth 2 (two) times daily. 180 capsule 1  ? rOPINIRole (REQUIP) 2 MG tablet Take 1 tablet at 5 Pm and 1/2 tablet at bedtime 135 tablet 3  ? sildenafil (REVATIO) 20 MG tablet Take 20-100 mg by mouth as needed.    ? ?No current facility-administered medications for this visit.  ? ? ?Allergies:   Cymbalta [duloxetine hcl] and Penicillins  ? ? ? ?ROS:  Please see the history of present illness.   Otherwise, review of systems are positive for none.   All other systems are reviewed and negative.  ? ? ?PHYSICAL EXAM: ?VS:  BP 136/70 (BP Location: Left Arm, Patient Position: Sitting, Cuff Size: Normal)   Pulse 64   Ht '6\' 1"'$  (1.854 m)    Wt 256 lb (116.1 kg)   BMI 33.78 kg/m?  , BMI Body mass index is 33.78 kg/m?. ?GENERAL:  Well appearing ?NECK:  No jugular venous distention, waveform within normal limits, carotid upstroke brisk and symmetric, no bruits, no thyromegaly ?LUNGS:  Clear to auscultation bilaterally ?CHEST:  Unremarkable ?HEART:  PMI not displaced or sustained,S1 and S2 within normal limits, no S3, no S4, no clicks, no rubs, no murmurs ?ABD:  Flat, positive bowel sounds normal in frequency in pitch, no bruits, no rebound, no guarding, no midline pulsatile mass, no hepatomegaly, no splenomegaly ?EXT:  2 plus pulses throughout, trace leg edema, no cyanosis no clubbing ? ? ?EKG:  EKG is ordered today. ?The ekg ordered today demonstrates sinus rhythm, rate 64, axis within normal limits, intervals within normal, no acute ST-T wave changes. ? ? ?Recent Labs: ?02/11/2021: ALT 16; BUN 20; Creatinine, Ser 0.90; Hemoglobin 13.0; Platelets 195; Potassium 4.3; Sodium 139  ? ? ?Lipid Panel ?No results found for: CHOL, TRIG, HDL, CHOLHDL, VLDL, LDLCALC, LDLDIRECT ?  ? ?Wt Readings from Last 3 Encounters:  ?10/03/21 256 lb (116.1 kg)  ?09/15/21 252 lb 3.2 oz (114.4 kg)  ?03/20/21 260 lb (117.9 kg)  ?  ? ? ?Other studies Reviewed: ?Additional studies/ records that were reviewed today include: Labs. ?Review of the above records demonstrates:  Please see elsewhere in the note.  See elsewhere ? ? ?ASSESSMENT AND PLAN: ? ? ?DOE:    He has had a normal BNP previously.  He has had normal left ventricular systolic dysfunction.  There was only mild concentric left ventricular hypertrophy.  He felt better after he lost weight and now is short of breath again with weight gain.  I suspect this is related to weight and deconditioning.  However, I would like to order a POET (Plain Old Exercise Treadmill).   ? ?OBESITY:   We again talked about the need for weight loss. ? ?LVH:   This was mild over serial exams.  No further evaluation is indicated.  ?  ?HTN: Blood  pressure is at target.  No change in therapy.  ? ?Current medicines are reviewed at length with the patient today.  The patient does not have concerns regarding medicines. ? ?The following changes have been made:  no change ? ?Labs/ tests ordered today include:  ? ?Orders Placed This Encounter  ?Procedures  ?  Exercise Tolerance Test  ? EKG 12-Lead  ? ? ? ?Disposition:   FU with as needed based on these results.   ? ? ?Signed, ?Daniel Breeding, MD  ?10/03/2021 8:33 AM    ?[place] ? ? ? ?

## 2021-10-01 NOTE — H&P (View-Only) (Signed)
Cardiology Office Note   Date:  10/03/2021   ID:  Daniel Whitaker, DOB Sep 13, 1945, MRN [id number]  PCP:  [name], MD  Cardiologist:   Minus Breeding, MD   Chief Complaint  Patient presents with   Shortness of Breath      History of Present Illness: Daniel Whitaker is a 76 y.o. male who was referred by [name], MD for evaluation of DOE.   After my first visit with him I sent him for an echo which was normal and BNP was not elevated.    Since I saw him he is back to having the same shortness of breath he was having previously.  He had lost about 15 pounds and was breathing better.  However, he gained that weight back.  He is not short of breath climbing a flight of stairs which is back to where he was.  He is not describing chest pressure, neck or arm discomfort.  He is not having any PND or orthopnea.  He is not having any palpitations, presyncope or syncope.  He did have a sharp shooting pain in in his leg and he points to his right dorsal thigh about a month ago.  This was brief and came back for 5 times but then has not come back in weeks.  He is not having any calf tenderness.   Past Medical History:  Diagnosis Date   Arthritis    Cataract    removed bilaterally    Erectile dysfunction    Esophageal reflux    Glaucoma    Hypertriglyceridemia    Impaired fasting glucose    MGUS (monoclonal gammopathy of unknown significance)    Neuropathy associated with MGUS ([place]) 07/11/2013   Nocturnal leg cramps 03/11/2021   Nontoxic uninodular goiter    Obesity    Peripheral neuropathy    RLS (restless legs syndrome)    Spinal stenosis    Unspecified deficiency anemia 07/11/2013    Past Surgical History:  Procedure Laterality Date   COLON RESECTION N/A 04/21/2013   Procedure: DIAGNOSTIC LAPAROSCOPY, lysis of adhesions for partial small bowel obstruction;  Surgeon: Odis Hollingshead, MD;  Location: [facility];  Service: General;  Laterality: N/A;   COLON SURGERY      partial SBO    COLONOSCOPY     KNEE SURGERY     Arthroscopic   SMALL INTESTINE SURGERY     Blockage     Current Outpatient Medications  Medication Sig Dispense Refill   aspirin EC 81 MG tablet Take 81 mg by mouth daily.     brimonidine (ALPHAGAN) 0.2 % ophthalmic solution Place 1 drop into both eyes in the morning and at bedtime.     cetirizine (ZYRTEC) 10 MG tablet Take 10 mg by mouth daily.     Cholecalciferol (VITAMIN D) 2000 UNITS CAPS Take 2,000 Units by mouth.     Cyanocobalamin (VITAMIN B-12 PO) Take 2 tablets by mouth daily.      dorzolamide-timolol (COSOPT) 22.3-6.8 MG/ML ophthalmic solution Place 1 drop into the left eye 2 (two) times daily.     ketoconazole (NIZORAL) 2 % cream      latanoprost (XALATAN) 0.005 % ophthalmic solution      lidocaine (XYLOCAINE) 5 % ointment Apply 1 application topically 2 (two) times daily as needed. 35.44 g 3   MAGNESIUM PO Take by mouth daily.     meloxicam (MOBIC) 15 MG tablet Take 15 mg by mouth daily.  multivitamin-iron-minerals-folic acid (CENTRUM) chewable tablet Chew 1 tablet by mouth daily.     Omega-3 Fatty Acids (FISH OIL) 1000 MG CAPS Take 1,000 mg by mouth daily. 3     pantoprazole (PROTONIX) 40 MG tablet Take 40 mg by mouth daily.      pregabalin (LYRICA) 200 MG capsule Take 1 capsule (200 mg total) by mouth 2 (two) times daily. 180 capsule 1   rOPINIRole (REQUIP) 2 MG tablet Take 1 tablet at 5 Pm and 1/2 tablet at bedtime 135 tablet 3   sildenafil (REVATIO) 20 MG tablet Take 20-100 mg by mouth as needed.     No current facility-administered medications for this visit.    Allergies:   Cymbalta [duloxetine hcl] and Penicillins     ROS:  Please see the history of present illness.   Otherwise, review of systems are positive for none.   All other systems are reviewed and negative.    PHYSICAL EXAM: VS:  BP 136/70 (BP Location: Left Arm, Patient Position: Sitting, Cuff Size: Normal)   Pulse 64   Ht '6\' 1"'$  (1.854 m)    Wt 256 lb (116.1 kg)   BMI 33.78 kg/m  , BMI Body mass index is 33.78 kg/m. GENERAL:  Well appearing NECK:  No jugular venous distention, waveform within normal limits, carotid upstroke brisk and symmetric, no bruits, no thyromegaly LUNGS:  Clear to auscultation bilaterally CHEST:  Unremarkable HEART:  PMI not displaced or sustained,S1 and S2 within normal limits, no S3, no S4, no clicks, no rubs, no murmurs ABD:  Flat, positive bowel sounds normal in frequency in pitch, no bruits, no rebound, no guarding, no midline pulsatile mass, no hepatomegaly, no splenomegaly EXT:  2 plus pulses throughout, trace leg edema, no cyanosis no clubbing   EKG:  EKG is ordered today. The ekg ordered today demonstrates sinus rhythm, rate 64, axis within normal limits, intervals within normal, no acute ST-T wave changes.   Recent Labs: 02/11/2021: ALT 16; BUN 20; Creatinine, Ser 0.90; Hemoglobin 13.0; Platelets 195; Potassium 4.3; Sodium 139    Lipid Panel No results found for: CHOL, TRIG, HDL, CHOLHDL, VLDL, LDLCALC, LDLDIRECT    Wt Readings from Last 3 Encounters:  10/03/21 256 lb (116.1 kg)  09/15/21 252 lb 3.2 oz (114.4 kg)  03/20/21 260 lb (117.9 kg)      Other studies Reviewed: Additional studies/ records that were reviewed today include: Labs. Review of the above records demonstrates:  Please see elsewhere in the note.  See elsewhere   ASSESSMENT AND PLAN:   DOE:    He has had a normal BNP previously.  He has had normal left ventricular systolic dysfunction.  There was only mild concentric left ventricular hypertrophy.  He felt better after he lost weight and now is short of breath again with weight gain.  I suspect this is related to weight and deconditioning.  However, I would like to order a POET (Plain Old Exercise Treadmill).    OBESITY:   We again talked about the need for weight loss.  LVH:   This was mild over serial exams.  No further evaluation is indicated.    HTN: Blood  pressure is at target.  No change in therapy.   Current medicines are reviewed at length with the patient today.  The patient does not have concerns regarding medicines.  The following changes have been made:  no change  Labs/ tests ordered today include:   Orders Placed This Encounter  Procedures  Exercise Tolerance Test   EKG 12-Lead     Disposition:   FU with as needed based on these results.     Signed, Minus Breeding, MD  10/03/2021 8:33 AM    [facility]

## 2021-10-03 ENCOUNTER — Encounter: Payer: Self-pay | Admitting: Cardiology

## 2021-10-03 ENCOUNTER — Ambulatory Visit: Payer: Medicare Other | Admitting: Cardiology

## 2021-10-03 VITALS — BP 136/70 | HR 64 | Ht 73.0 in | Wt 256.0 lb

## 2021-10-03 DIAGNOSIS — I10 Essential (primary) hypertension: Secondary | ICD-10-CM | POA: Diagnosis not present

## 2021-10-03 DIAGNOSIS — I517 Cardiomegaly: Secondary | ICD-10-CM

## 2021-10-03 DIAGNOSIS — R0602 Shortness of breath: Secondary | ICD-10-CM | POA: Diagnosis not present

## 2021-10-03 NOTE — Patient Instructions (Signed)
Medication Instructions:  ?Continue same medications ? ? ?Lab Work: ?None ordered ? ? ?Testing/Procedures: ?Stress Test  ( POET ) ? ? ?Follow-Up: ?At [facility], you and your health needs are our priority.  As part of our continuing mission to provide you with exceptional heart care, we have created designated Provider Care Teams.  These Care Teams include your primary Cardiologist (physician) and Advanced Practice Providers (APPs -  Physician Assistants and Nurse Practitioners) who all work together to provide you with the care you need, when you need it. ? ?We recommend signing up for the patient portal called "MyChart".  Sign up information is provided on this After Visit Summary.  MyChart is used to connect with patients for Virtual Visits (Telemedicine).  Patients are able to view lab/test results, encounter notes, upcoming appointments, etc.  Non-urgent messages can be sent to your provider as well.   ?To learn more about what you can do with MyChart, go to [email or web address].   ? ?Your next appointment:  As Needed ?  ? ?The format for your next appointment: Office ? ? ?Provider: Dr.Hochrein ? ? ?Important Information About Sugar ? ? ? ? ? ? ?

## 2021-10-13 DIAGNOSIS — D472 Monoclonal gammopathy: Secondary | ICD-10-CM | POA: Diagnosis not present

## 2021-10-13 DIAGNOSIS — G629 Polyneuropathy, unspecified: Secondary | ICD-10-CM | POA: Diagnosis not present

## 2021-10-13 DIAGNOSIS — R7309 Other abnormal glucose: Secondary | ICD-10-CM | POA: Diagnosis not present

## 2021-10-13 DIAGNOSIS — E782 Mixed hyperlipidemia: Secondary | ICD-10-CM | POA: Diagnosis not present

## 2021-10-14 ENCOUNTER — Telehealth (HOSPITAL_COMMUNITY): Payer: Self-pay | Admitting: *Deleted

## 2021-10-14 NOTE — Telephone Encounter (Signed)
Close encounter 

## 2021-10-15 ENCOUNTER — Ambulatory Visit (HOSPITAL_COMMUNITY)
Admission: RE | Admit: 2021-10-15 | Discharge: 2021-10-15 | Disposition: A | Discharge status: Home / Self Care | Payer: Medicare Other | Source: Ambulatory Visit | Attending: Cardiology | Admitting: Cardiology

## 2021-10-15 DIAGNOSIS — I10 Essential (primary) hypertension: Secondary | ICD-10-CM | POA: Diagnosis not present

## 2021-10-15 DIAGNOSIS — I517 Cardiomegaly: Secondary | ICD-10-CM | POA: Insufficient documentation

## 2021-10-15 DIAGNOSIS — R0602 Shortness of breath: Secondary | ICD-10-CM | POA: Diagnosis not present

## 2021-10-15 LAB — EXERCISE TOLERANCE TEST
Angina Index: 0
Duke Treadmill Score: -3
Estimated workload: 8
Exercise duration (min): 6 min
Exercise duration (sec): 43 s
MPHR: 145 {beats}/min
Peak HR: 148 {beats}/min
Percent HR: 102 %
Rest HR: 57 {beats}/min
ST Depression (mm): 2 mm

## 2021-10-17 ENCOUNTER — Other Ambulatory Visit: Payer: Self-pay | Admitting: *Deleted

## 2021-10-17 DIAGNOSIS — R9431 Abnormal electrocardiogram [ECG] [EKG]: Secondary | ICD-10-CM

## 2021-10-17 MED ORDER — SODIUM CHLORIDE 0.9% FLUSH: 3.0000 mL | Freq: Two times a day (BID) | INTRAVENOUS | Status: DC

## 2021-10-20 ENCOUNTER — Other Ambulatory Visit: Payer: Self-pay | Admitting: *Deleted

## 2021-10-20 ENCOUNTER — Telehealth: Payer: Self-pay | Admitting: *Deleted

## 2021-10-20 DIAGNOSIS — R9431 Abnormal electrocardiogram [ECG] [EKG]: Secondary | ICD-10-CM

## 2021-10-20 DIAGNOSIS — Z01812 Encounter for preprocedural laboratory examination: Secondary | ICD-10-CM

## 2021-10-20 NOTE — Telephone Encounter (Signed)
Cardiac Catheterization scheduled at [facility] for: Wednesday Oct 22, 2021 1 PM Arrival time and place: [place] Entrance A at: 11 AM   Nothing to eat after midnight prior to procedure, clear liquids until 5 AM day of procedure.   Medication instructions: -Usual morning medications can be taken with sips of water including aspirin 81 mg.  Confirmed patient has responsible adult to drive home post procedure and be with patient first 24 hours after arriving home.  Patient reports no new symptoms concerning for COVID-19/no exposure to COVID-19 in the past 10 days.  Reviewed procedure instructions with patient.

## 2021-10-20 NOTE — Telephone Encounter (Signed)
10/13/21 BMP scanned under Media Tab-do not see CBC. Call placed to Dr Joaquin Music office, was told CBC not done 10/13/21, last CBC 2021. Patient will plan to go to [facility] this afternoon to get CBC.

## 2021-10-21 LAB — CBC
Hematocrit: 41.7 % (ref 37.5–51.0)
Hemoglobin: 14.1 g/dL (ref 13.0–17.7)
MCH: 31.2 pg (ref 26.6–33.0)
MCHC: 33.8 g/dL (ref 31.5–35.7)
MCV: 92 fL (ref 79–97)
Platelets: 199 10*3/uL (ref 150–450)
RBC: 4.52 x10E6/uL (ref 4.14–5.80)
RDW: 13 % (ref 11.6–15.4)
WBC: 7.1 10*3/uL (ref 3.4–10.8)

## 2021-10-21 NOTE — Telephone Encounter (Signed)
10/13/21 BMP results under Media Tab-10/16/21 "AMB Correspondence"

## 2021-10-22 ENCOUNTER — Encounter (HOSPITAL_COMMUNITY)
Admission: RE | Disposition: A | Discharge status: Home / Self Care | Payer: Self-pay | Source: Home / Self Care | Attending: Cardiology

## 2021-10-22 ENCOUNTER — Other Ambulatory Visit: Payer: Self-pay

## 2021-10-22 ENCOUNTER — Ambulatory Visit (HOSPITAL_COMMUNITY)
Admission: RE | Admit: 2021-10-22 | Discharge: 2021-10-22 | Disposition: A | Discharge status: Home / Self Care | Payer: Medicare Other | Attending: Cardiology | Admitting: Cardiology

## 2021-10-22 ENCOUNTER — Other Ambulatory Visit (HOSPITAL_COMMUNITY): Payer: Self-pay

## 2021-10-22 DIAGNOSIS — R0602 Shortness of breath: Secondary | ICD-10-CM | POA: Diagnosis not present

## 2021-10-22 DIAGNOSIS — I2510 Atherosclerotic heart disease of native coronary artery without angina pectoris: Secondary | ICD-10-CM | POA: Insufficient documentation

## 2021-10-22 DIAGNOSIS — Z6833 Body mass index (BMI) 33.0-33.9, adult: Secondary | ICD-10-CM | POA: Insufficient documentation

## 2021-10-22 DIAGNOSIS — E785 Hyperlipidemia, unspecified: Secondary | ICD-10-CM | POA: Diagnosis not present

## 2021-10-22 DIAGNOSIS — R9431 Abnormal electrocardiogram [ECG] [EKG]: Secondary | ICD-10-CM | POA: Insufficient documentation

## 2021-10-22 DIAGNOSIS — Z955 Presence of coronary angioplasty implant and graft: Secondary | ICD-10-CM

## 2021-10-22 DIAGNOSIS — E669 Obesity, unspecified: Secondary | ICD-10-CM | POA: Insufficient documentation

## 2021-10-22 HISTORY — PX: CORONARY STENT INTERVENTION: CATH118234

## 2021-10-22 HISTORY — PX: LEFT HEART CATH AND CORONARY ANGIOGRAPHY: CATH118249

## 2021-10-22 LAB — BASIC METABOLIC PANEL
Anion gap: 9 (ref 5–15)
BUN: 18 mg/dL (ref 8–23)
CO2: 26 mmol/L (ref 22–32)
Calcium: 9.5 mg/dL (ref 8.9–10.3)
Chloride: 108 mmol/L (ref 98–111)
Creatinine, Ser: 1.04 mg/dL (ref 0.61–1.24)
GFR, Estimated: >60 mL/min (ref >60)
Glucose, Bld: 107 mg/dL — ABNORMAL HIGH (ref 70–99)
Potassium: 5 mmol/L (ref 3.5–5.1)
Sodium: 143 mmol/L (ref 135–145)

## 2021-10-22 LAB — POCT ACTIVATED CLOTTING TIME: Activated Clotting Time: 281 seconds

## 2021-10-22 SURGERY — LEFT HEART CATH AND CORONARY ANGIOGRAPHY
Anesthesia: LOCAL

## 2021-10-22 MED ORDER — LABETALOL HCL 5 MG/ML IV SOLN: 10.0000 mg | INTRAVENOUS | Status: DC | PRN

## 2021-10-22 MED ORDER — SODIUM CHLORIDE 0.9 % IV SOLN: 250.0000 mL | INTRAVENOUS | Status: DC | PRN

## 2021-10-22 MED ORDER — HEPARIN SODIUM (PORCINE) 1000 UNIT/ML IJ SOLN
INTRAMUSCULAR | Status: DC | PRN
  Administered 2021-10-22 (×2): 5500 [IU] via INTRAVENOUS

## 2021-10-22 MED ORDER — CLOPIDOGREL BISULFATE 75 MG PO TABS: 75.0000 mg | ORAL_TABLET | Freq: Every day | ORAL | Status: DC

## 2021-10-22 MED ORDER — ASPIRIN 81 MG PO TBEC
81.0000 mg | DELAYED_RELEASE_TABLET | Freq: Every day | ORAL | 0 refills | Status: DC
  Filled 2021-10-22: qty 30, 30d supply, fill #0

## 2021-10-22 MED ORDER — ACETAMINOPHEN 325 MG PO TABS: 650.0000 mg | ORAL_TABLET | ORAL | Status: DC | PRN

## 2021-10-22 MED ORDER — CLOPIDOGREL BISULFATE 300 MG PO TABS
ORAL_TABLET | ORAL | Status: AC
  Filled 2021-10-22: qty 2

## 2021-10-22 MED ORDER — ASPIRIN 81 MG PO CHEW: 81.0000 mg | CHEWABLE_TABLET | Freq: Every day | ORAL | Status: DC

## 2021-10-22 MED ORDER — CLOPIDOGREL BISULFATE 75 MG PO TABS
75.0000 mg | ORAL_TABLET | Freq: Every day | ORAL | 0 refills | Status: DC
  Filled 2021-10-22: qty 30, 30d supply, fill #0

## 2021-10-22 MED ORDER — ONDANSETRON HCL 4 MG/2ML IJ SOLN
INTRAMUSCULAR | Status: AC
  Filled 2021-10-22: qty 2

## 2021-10-22 MED ORDER — ROSUVASTATIN CALCIUM 40 MG PO TABS
40.0000 mg | ORAL_TABLET | Freq: Every day | ORAL | 1 refills | Status: DC
  Filled 2021-10-22: qty 90, 90d supply, fill #0

## 2021-10-22 MED ORDER — SODIUM CHLORIDE 0.9 % WEIGHT BASED INFUSION: 1.0000 mL/kg/h | INTRAVENOUS | Status: DC

## 2021-10-22 MED ORDER — VERAPAMIL HCL 2.5 MG/ML IV SOLN
INTRAVENOUS | Status: AC
  Filled 2021-10-22: qty 2

## 2021-10-22 MED ORDER — FAMOTIDINE IN NACL 20-0.9 MG/50ML-% IV SOLN
INTRAVENOUS | Status: AC
  Filled 2021-10-22: qty 50

## 2021-10-22 MED ORDER — LIDOCAINE HCL (PF) 1 % IJ SOLN
INTRAMUSCULAR | Status: DC | PRN
  Administered 2021-10-22: 2 mL

## 2021-10-22 MED ORDER — HEPARIN (PORCINE) IN NACL 1000-0.9 UT/500ML-% IV SOLN
INTRAVENOUS | Status: AC
  Filled 2021-10-22: qty 1000

## 2021-10-22 MED ORDER — CLOPIDOGREL BISULFATE 300 MG PO TABS
ORAL_TABLET | ORAL | Status: DC | PRN
  Administered 2021-10-22: 600 mg via ORAL

## 2021-10-22 MED ORDER — SODIUM CHLORIDE 0.9% FLUSH: 3.0000 mL | INTRAVENOUS | Status: DC | PRN

## 2021-10-22 MED ORDER — LIDOCAINE HCL (PF) 1 % IJ SOLN
INTRAMUSCULAR | Status: AC
  Filled 2021-10-22: qty 30

## 2021-10-22 MED ORDER — NITROGLYCERIN 1 MG/10 ML FOR IR/CATH LAB
INTRA_ARTERIAL | Status: DC | PRN
  Administered 2021-10-22 (×2): 200 ug via INTRACORONARY

## 2021-10-22 MED ORDER — VERAPAMIL HCL 2.5 MG/ML IV SOLN
INTRAVENOUS | Status: DC | PRN
  Administered 2021-10-22: 10 mL via INTRA_ARTERIAL

## 2021-10-22 MED ORDER — FENTANYL CITRATE (PF) 100 MCG/2ML IJ SOLN
INTRAMUSCULAR | Status: AC
  Filled 2021-10-22: qty 2

## 2021-10-22 MED ORDER — HYDRALAZINE HCL 20 MG/ML IJ SOLN: 10.0000 mg | INTRAMUSCULAR | Status: DC | PRN

## 2021-10-22 MED ORDER — ONDANSETRON HCL 4 MG/2ML IJ SOLN
INTRAMUSCULAR | Status: DC | PRN
Start: 2021-10-22 — End: 2021-10-22
  Administered 2021-10-22: 4 mg via INTRAVENOUS

## 2021-10-22 MED ORDER — SODIUM CHLORIDE 0.9% FLUSH: 3.0000 mL | Freq: Two times a day (BID) | INTRAVENOUS | Status: DC

## 2021-10-22 MED ORDER — HEPARIN (PORCINE) IN NACL 1000-0.9 UT/500ML-% IV SOLN
INTRAVENOUS | Status: DC | PRN
  Administered 2021-10-22 (×2): 500 mL

## 2021-10-22 MED ORDER — ASPIRIN 81 MG PO CHEW: 81.0000 mg | CHEWABLE_TABLET | ORAL | Status: DC

## 2021-10-22 MED ORDER — NITROGLYCERIN 0.4 MG SL SUBL
0.4000 mg | SUBLINGUAL_TABLET | SUBLINGUAL | 2 refills | Status: DC | PRN
  Filled 2021-10-22: qty 25, 14d supply, fill #0

## 2021-10-22 MED ORDER — FENTANYL CITRATE (PF) 100 MCG/2ML IJ SOLN
INTRAMUSCULAR | Status: DC | PRN
  Administered 2021-10-22: 50 ug via INTRAVENOUS

## 2021-10-22 MED ORDER — ONDANSETRON HCL 4 MG/2ML IJ SOLN
4.0000 mg | Freq: Four times a day (QID) | INTRAMUSCULAR | Status: DC | PRN
Start: 2021-10-22 — End: 2021-10-22

## 2021-10-22 MED ORDER — MIDAZOLAM HCL 2 MG/2ML IJ SOLN
INTRAMUSCULAR | Status: DC | PRN
  Administered 2021-10-22: 2 mg via INTRAVENOUS

## 2021-10-22 MED ORDER — MIDAZOLAM HCL 2 MG/2ML IJ SOLN
INTRAMUSCULAR | Status: AC
  Filled 2021-10-22: qty 2

## 2021-10-22 MED ORDER — FAMOTIDINE IN NACL 20-0.9 MG/50ML-% IV SOLN
INTRAVENOUS | Status: DC | PRN
  Administered 2021-10-22: 20 mg via INTRAVENOUS

## 2021-10-22 MED ORDER — MORPHINE SULFATE (PF) 2 MG/ML IV SOLN: 2.0000 mg | INTRAVENOUS | Status: DC | PRN

## 2021-10-22 MED ORDER — SODIUM CHLORIDE 0.9 % WEIGHT BASED INFUSION
3.0000 mL/kg/h | INTRAVENOUS | Status: AC
  Administered 2021-10-22: 3 mL/kg/h via INTRAVENOUS

## 2021-10-22 MED ORDER — SODIUM CHLORIDE 0.9% FLUSH
3.0000 mL | INTRAVENOUS | Status: DC | PRN
Start: 2021-10-22 — End: 2021-10-22

## 2021-10-22 MED ORDER — CARVEDILOL 3.125 MG PO TABS
3.1250 mg | ORAL_TABLET | Freq: Two times a day (BID) | ORAL | 1 refills | Status: DC
  Filled 2021-10-22: qty 60, 30d supply, fill #0

## 2021-10-22 MED ORDER — HEPARIN SODIUM (PORCINE) 1000 UNIT/ML IJ SOLN
INTRAMUSCULAR | Status: AC
Start: 2021-10-22 — End: ?
  Filled 2021-10-22: qty 10

## 2021-10-22 MED ORDER — IOHEXOL 350 MG/ML SOLN
INTRAVENOUS | Status: DC | PRN
  Administered 2021-10-22: 140 mL

## 2021-10-22 MED ORDER — NITROGLYCERIN 1 MG/10 ML FOR IR/CATH LAB
INTRA_ARTERIAL | Status: AC
  Filled 2021-10-22: qty 10

## 2021-10-22 SURGICAL SUPPLY — 17 items
BALLN EUPHORA RX 2.25X12 (BALLOONS) ×2
BALLOON EUPHORA RX 2.25X12 (BALLOONS) IMPLANT
BAND CMPR LRG ZPHR (HEMOSTASIS) ×1
BAND ZEPHYR COMPRESS 30 LONG (HEMOSTASIS) ×1 IMPLANT
CATH OPTITORQUE TIG 4.0 5F (CATHETERS) ×1 IMPLANT
CATH VISTA GUIDE 6FR XBLAD3.5 (CATHETERS) ×1 IMPLANT
GLIDESHEATH SLEND SS 6F .021 (SHEATH) ×1 IMPLANT
GUIDEWIRE INQWIRE 1.5J.035X260 (WIRE) IMPLANT
INQWIRE 1.5J .035X260CM (WIRE) ×2
KIT HEART LEFT (KITS) ×2 IMPLANT
PACK CARDIAC CATHETERIZATION (CUSTOM PROCEDURE TRAY) ×2 IMPLANT
SHEATH PROBE COVER 6X72 (BAG) ×1 IMPLANT
STENT SYNERGY XD 2.50X12 (Permanent Stent) IMPLANT
SYNERGY XD 2.50X12 (Permanent Stent) ×2 IMPLANT
TRANSDUCER W/STOPCOCK (MISCELLANEOUS) ×2 IMPLANT
TUBING CIL FLEX 10 FLL-RA (TUBING) ×2 IMPLANT
WIRE ASAHI PROWATER 180CM (WIRE) ×1 IMPLANT

## 2021-10-22 NOTE — Discharge Instructions (Signed)

## 2021-10-22 NOTE — Progress Notes (Signed)
Seen pt from [phone], for procedure, site care, restrictions, ex guidelines, and nutrition education. Pt denied having questions prior to leaving.

## 2021-10-22 NOTE — Discharge Summary (Signed)
Discharge Summary for Same Day PCI   Patient ID: Daniel Whitaker MRN: [id number]; DOB: Oct 19, 1945  Admit date: 10/22/2021 Discharge date: 10/22/2021  Primary Care Provider: [name], MD  Primary Cardiologist: Minus Breeding, MD  Primary Electrophysiologist:  None   Discharge Diagnoses    Principal Problem:   CAD (coronary artery disease) Active Problems:   Hyperlipidemia   Diagnostic Studies/Procedures    Cardiac Catheterization 10/22/2021:    Prox LAD to Mid LAD lesion is 85% stenosed.   A drug-eluting stent was successfully placed using a SYNERGY XD 2.50X12. ->  Deployed/postdilated to 2.8 mm   Post intervention, there is a 0% residual stenosis.   -------------------------------   The left ventricular systolic function is normal.  The left ventricular ejection fraction is 55-65% by visual estimate.   LV end diastolic pressure is normal.   Severe single-vessel disease with 85% proximal LAD ->  Successful DES PCI using Synergy DES 2.5 mm x 12 mm postdilated/deployed to 2.8 mm.   TIMI-3 flow pre and post.  Reduced to 0%. Otherwise angiographically normal coronary arteries. Normal LVEF with normal EDP.   RECOMMENDATIONS Titrate secondary discharge with ASA/Plavix for 1 year We will add rosuvastatin 40 mg daily and low-dose carvedilol 3.25 mg twice daily Follow-up with Dr. Natalia Leatherwood, MD  Diagnostic Dominance: Co-dominant Intervention   _____________   History of Present Illness     Daniel Whitaker is a 76 y.o. male with no significant cardiac hx who was referred to Dr. Percival Spanish by PCP for complaints of dyspnea on exertion. Initially sent for an echo which was normal. Seen again in follow up and continued to complain of dyspnea on exertion.   Cardiac catheterization was arranged for further evaluation.  Hospital Course     The patient underwent cardiac cath as noted above with successful PCI/DES x1 to pLAD. Plan for DAPT with ASA/plavix  for at least one year. The patient was seen by cardiac rehab while in short stay. There were no observed complications post cath. Radial cath site was re-evaluated prior to discharge and found to be stable without any complications. Instructions/precautions regarding cath site care were given prior to discharge. Started on crestor '40mg'$  daily along with coreg 3.'125mg'$  BID prior to discharge.   Lynne Logan was seen by Dr. Ellyn Hack and determined stable for discharge home. Follow up with our office has been arranged. Medications are listed below. Pertinent changes include addition of plavix, crestor and coreg.  _____________  Cath/PCI Registry Performance & Quality Measures: Aspirin prescribed? - Yes ADP Receptor Inhibitor (Plavix/Clopidogrel, Brilinta/Ticagrelor or Effient/Prasugrel) prescribed (includes medically managed patients)? - Yes High Intensity Statin (Lipitor 40-'80mg'$  or Crestor 20-'40mg'$ ) prescribed? - Yes For EF <40%, was ACEI/ARB prescribed? - Not Applicable (EF >/= 82%) For EF <40%, Aldosterone Antagonist (Spironolactone or Eplerenone) prescribed? - Not Applicable (EF >/= 99%) Cardiac Rehab Phase II ordered (Included Medically managed Patients)? - Yes  _____________   Discharge Vitals Blood pressure (!) 142/76, pulse 63, temperature 97.9 F (36.6 C), temperature source Oral, resp. rate 18, height '6\' 1"'$  (1.854 m), weight 109.8 kg, SpO2 96 %.  Filed Weights   10/22/21 0817  Weight: 109.8 kg    Last Labs & Radiologic Studies    CBC Recent Labs    10/20/21 1417  WBC 7.1  HGB 14.1  HCT 41.7  MCV 92  PLT 371   Basic Metabolic Panel Recent Labs    10/22/21 0830  NA 143  K  5.0  CL 108  CO2 26  GLUCOSE 107*  BUN 18  CREATININE 1.04  CALCIUM 9.5   Liver Function Tests No results for input(s): AST, ALT, ALKPHOS, BILITOT, PROT, ALBUMIN in the last 72 hours. No results for input(s): LIPASE, AMYLASE in the last 72 hours. High Sensitivity Troponin:   No results for  input(s): TROPONINIHS in the last 720 hours.  BNP Invalid input(s): POCBNP D-Dimer No results for input(s): DDIMER in the last 72 hours. Hemoglobin A1C No results for input(s): HGBA1C in the last 72 hours. Fasting Lipid Panel No results for input(s): CHOL, HDL, LDLCALC, TRIG, CHOLHDL, LDLDIRECT in the last 72 hours. Thyroid Function Tests No results for input(s): TSH, T4TOTAL, T3FREE, THYROIDAB in the last 72 hours.  Invalid input(s): FREET3 _____________  CARDIAC CATHETERIZATION  Result Date: 10/22/2021   Prox LAD to Mid LAD lesion is 85% stenosed.   A drug-eluting stent was successfully placed using a SYNERGY XD 2.50X12. ->  Deployed/postdilated to 2.8 mm   Post intervention, there is a 0% residual stenosis.   -------------------------------   The left ventricular systolic function is normal.  The left ventricular ejection fraction is 55-65% by visual estimate.   LV end diastolic pressure is normal. Severe single-vessel disease with 85% proximal LAD -> Successful DES PCI using Synergy DES 2.5 mm x 12 mm postdilated/deployed to 2.8 mm.  TIMI-3 flow pre and post.  Reduced to 0%. Otherwise angiographically normal coronary arteries. Normal LVEF with normal EDP. RECOMMENDATIONS Titrate secondary discharge with ASA/Plavix for 1 year We will add rosuvastatin 40 mg daily and low-dose carvedilol 3.25 mg twice daily Follow-up with Dr. Natalia Leatherwood, MD   Exercise Tolerance Test  Result Date: 10/15/2021   2 mm horizontal ST depression notedin V5-6 and inferior leads. T wave inversion noted in these leads too. ST depression persisted >5 minutes after exercise with recovery PVCs that were frequent. Normal HR response to exercise. Hypertensive response to exercise.   High risk stress test. Would recommend cardiac cath.    Disposition   Pt is being discharged home today in good condition.  Follow-up Plans & Appointments     Follow-up Information     Warren Lacy, PA-C Follow up on  10/30/2021.   Specialty: Internal Medicine Why: at 10am for your follow up with Dr. Cherlyn Cushing' PA Contact information: [address] [place] [address] [phone]                Discharge Instructions     Amb Referral to Cardiac Rehabilitation   Complete by: As directed    Diagnosis: Coronary Stents   After initial evaluation and assessments completed: Virtual Based Care may be provided alone or in conjunction with Phase 2 Cardiac Rehab based on patient barriers.: Yes        Discharge Medications   Allergies as of 10/22/2021       Reactions   Cymbalta [duloxetine Hcl]    Drowsiness, nausea   Penicillins Hives        Medication List     STOP taking these medications    meloxicam 15 MG tablet Commonly known as: MOBIC       TAKE these medications    Aspirin Low Dose 81 MG tablet Generic drug: aspirin EC Take 1 tablet (81 mg total) by mouth daily. Swallow whole. What changed: additional instructions   brimonidine 0.2 % ophthalmic solution Commonly known as: ALPHAGAN Place 1 drop into both eyes in the morning and at  bedtime.   carvedilol 3.125 MG tablet Commonly known as: Coreg Take 1 tablet (3.125 mg total) by mouth 2 (two) times daily.   Centrum tablet Take 1 tablet by mouth daily. 50 +   cetirizine 10 MG tablet Commonly known as: ZYRTEC Take 10 mg by mouth every morning.   clopidogrel 75 MG tablet Commonly known as: Plavix Take 1 tablet (75 mg total) by mouth daily.   dorzolamide-timolol 22.3-6.8 MG/ML ophthalmic solution Commonly known as: COSOPT Place 1 drop into the left eye 2 (two) times daily.   Fish Oil 1200 MG Caps Take 3,600 mg by mouth daily. 3   ketoconazole 2 % cream Commonly known as: NIZORAL 1 application. daily as needed (Dry skin).   latanoprost 0.005 % ophthalmic solution Commonly known as: XALATAN Place 1 drop into both eyes 2 (two) times daily.   lidocaine 5 % ointment Commonly known as:  XYLOCAINE Apply 1 application topically 2 (two) times daily as needed. What changed: reasons to take this   MAGNESIUM PO Take 1 tablet by mouth daily.   nitroGLYCERIN 0.4 MG SL tablet Commonly known as: Nitrostat Place 1 tablet (0.4 mg total) under the tongue every 5 (five) minutes as needed.   pantoprazole 40 MG tablet Commonly known as: PROTONIX Take 40 mg by mouth daily.   pregabalin 200 MG capsule Commonly known as: LYRICA Take 1 capsule (200 mg total) by mouth 2 (two) times daily.   rOPINIRole 2 MG tablet Commonly known as: Requip Take 1 tablet at 5 Pm and 1/2 tablet at bedtime   rosuvastatin 40 MG tablet Commonly known as: CRESTOR Take 1 tablet (40 mg total) by mouth daily.   sildenafil 20 MG tablet Commonly known as: REVATIO Take 20-100 mg by mouth daily as needed (ED). Notes to patient: Do not take NITRO within 36 hrs of sildenafil as this may cause and unsafe drop in blood pressure   Vitamin B-12 5000 MCG Tbdp Take 10,000 mcg by mouth every morning.   Vitamin D 50 MCG (2000 UT) Caps Take 2,000 Units by mouth daily.           Allergies Allergies  Allergen Reactions   Cymbalta [Duloxetine Hcl]     Drowsiness, nausea   Penicillins Hives    Outstanding Labs/Studies   FLP/LFTs in 8 weeks   Duration of Discharge Encounter   Greater than 30 minutes including physician time.  Signed, Reino Bellis, NP 10/22/2021, 3:03 PM

## 2021-10-22 NOTE — Interval H&P Note (Signed)
History and Physical Interval Note:  10/22/2021 10:00 AM  Daniel Whitaker  has presented today for surgery, with the diagnosis of abnormal stress test.    He was seen on Oct 03, 2021 by Dr. Percival Spanish and referred for GXT which was read as high risk with 2 mm horizontal ST depressions in inferior lateral leads.  He was contacted (by Dr. Percival Spanish) via telephone to discuss the results and scheduled for cardiac catheterization.  Dr. Percival Spanish called and spoke with the patient.  High risk perfusion study.  Needs cardiac cath.  The patient understands that risks included but are not limited to stroke (1 in 1000), death (1 in 54), kidney failure [usually temporary] (1 in 500), bleeding (1 in 200), allergic reaction [possibly serious] (1 in 200).  The patient understands and agrees to proceed.   Please call and schedule.  He says that he has recent labs from [name], MD but we don't have these scanned in yet.  The various methods of treatment have been discussed with the patient and family. After consideration of risks, benefits and other options for treatment, the patient has consented to  Procedure(s): LEFT HEART CATH AND CORONARY ANGIOGRAPHY (N/A)  PERCUTANEOUS CORONARY INTERVENTION  as a surgical intervention.  The patient's history has been reviewed, patient examined, no change in status, stable for surgery.  I have reviewed the patient's chart and labs.  Questions were answered to the patient's satisfaction.    Cath Lab Visit (complete for each Cath Lab visit)  Clinical Evaluation Leading to the Procedure:   ACS: No.  Non-ACS:    Anginal Classification: CCS I  Anti-ischemic medical therapy: Minimal Therapy (1 class of medications)  Non-Invasive Test Results: Intermediate-risk stress test findings: cardiac mortality 1-3%/year  Prior CABG: No previous CABG     Glenetta Hew

## 2021-10-23 ENCOUNTER — Encounter (HOSPITAL_COMMUNITY): Payer: Self-pay | Admitting: Cardiology

## 2021-10-29 ENCOUNTER — Ambulatory Visit (INDEPENDENT_AMBULATORY_CARE_PROVIDER_SITE_OTHER): Payer: Medicare Other

## 2021-10-29 ENCOUNTER — Encounter: Payer: Self-pay | Admitting: Orthopaedic Surgery

## 2021-10-29 ENCOUNTER — Ambulatory Visit: Payer: Medicare Other | Admitting: Orthopaedic Surgery

## 2021-10-29 VITALS — Ht 73.0 in | Wt 248.0 lb

## 2021-10-29 DIAGNOSIS — G8929 Other chronic pain: Secondary | ICD-10-CM

## 2021-10-29 DIAGNOSIS — M1712 Unilateral primary osteoarthritis, left knee: Secondary | ICD-10-CM | POA: Diagnosis not present

## 2021-10-29 DIAGNOSIS — M25562 Pain in left knee: Secondary | ICD-10-CM

## 2021-10-29 MED ORDER — METHYLPREDNISOLONE ACETATE 40 MG/ML IJ SUSP
40.0000 mg | INTRAMUSCULAR | Status: AC | PRN
  Administered 2021-10-29: 40 mg via INTRA_ARTICULAR

## 2021-10-29 MED ORDER — LIDOCAINE HCL 1 % IJ SOLN
3.0000 mL | INTRAMUSCULAR | Status: AC | PRN
  Administered 2021-10-29: 3 mL

## 2021-10-29 NOTE — Progress Notes (Signed)
Office Visit Note   Patient: Daniel Whitaker           Date of Birth: [birth year]           MRN: [id number] Visit Date: 10/29/2021              Requested by: [name], MD [address] [place],  [place] [address] PCP: [name], MD   Assessment & Plan: Visit Diagnoses:  1. Left knee pain, unspecified chronicity   2. Unilateral primary osteoarthritis, left knee   3. Chronic pain of left knee     Plan: I went over his MRI findings and plain film findings of his left knee.  I did recommend a steroid injection today to temporize some of the more acute pain in his knee but he is definitely a candidate for hyaluronic acid.  I would not recommend surgery for his knee for a while given the fact that he just had a stent placed and is on Plavix.  Also given the fact that he has not had any conservative treatment including even a steroid injection.  I did place a steroid in his left knee without difficulty today.  I will see him back in follow-up to place hopefully hyaluronic acid in his left knee to treat the long-term pain from osteoarthritis.  He agrees with this treatment plan.  Follow-Up Instructions: No follow-ups on file.   Orders:  Orders Placed This Encounter  Procedures   Large Joint Inj   XR Knee 1-2 Views Left   No orders of the defined types were placed in this encounter.     Procedures: Large Joint Inj: L knee on 10/29/2021 3:56 PM Indications: diagnostic evaluation and pain Details: 22 G 1.5 in needle, superolateral approach  Arthrogram: No  Medications: 3 mL lidocaine 1 %; 40 mg methylPREDNISolone acetate 40 MG/ML Outcome: tolerated well, no immediate complications Procedure, treatment alternatives, risks and benefits explained, specific risks discussed. Consent was given by the patient. Immediately prior to procedure a time out was called to verify the correct patient, procedure, equipment, support staff and site/side marked as required.  Patient was prepped and draped in the usual sterile fashion.      Clinical Data: No additional findings.   Subjective: Chief Complaint  Patient presents with   Left Knee - New Patient (Initial Visit)  The patient is sometimes seen for the first time.  He has seen another orthopedic surgeon in town.  He has been dealing with chronic left knee pain has been hurting for 6 months to over a year now.  He actually had an MRI of his left knee in the fall of last year.  The MRI showed areas of full-thickness cartilage loss in the patellofemoral joint and the lateral compartment of his knee.  There is noes meniscal tearing.  He has not had any type of steroid injection in his knee and not had any type of gel injection.  He recently was started on Plavix after having a cardiac catheterization and a stent placed.  He is a young appearing 76 years old.  His knee hurts in the back of his knee at the weightbearing aspect and there is no weakness or locking catching.  It hurts mainly with activities and is stiff if he has been sitting for a while.  With being on Plavix he cannot take anti-inflammatories.  HPI  Review of Systems   Objective: Vital Signs: Ht '6\' 1"'$  (1.854 m)   Wt  248 lb (112.5 kg)   BMI 32.72 kg/m   Physical Exam He is alert and orient x3 and in no acute distress Ortho Exam Examination of his left knee shows excellent range of motion of that knee.  There is no effusion today.  There is posterior medial and posterior lateral pain at the weightbearing aspect of the knee.  His knee is ligamentously stable and the Lachman's and McMurray's exams are negative. Specialty Comments:  No specialty comments available.  Imaging: XR Knee 1-2 Views Left  Result Date: 10/29/2021 2 views of the left knee show no acute findings.  The medial lateral compartments are well-maintained but there is significant narrowing of the patellofemoral joint with arthritic changes of that aspect of the knee.     I MRI on the canopy system was reviewed of the left knee that was performed less than a year ago.  It shows areas of full-thickness cartilage loss of the weightbearing surface of the lateral compartment of the knee as well as the patellofemoral joint and moderate thinning of the medial compartment.  The ligaments are intact and there is no meniscal tear.  PMFS History: Patient Active Problem List   Diagnosis Date Noted   CAD (coronary artery disease) 10/22/2021   Hyperlipidemia 10/22/2021   Class 1 obesity due to excess calories in adult 03/20/2021   Nocturnal leg cramps 03/11/2021   MGUS (monoclonal gammopathy of unknown significance) 07/11/2013   Unspecified deficiency anemia 07/11/2013   Hypokalemia 04/15/2013   Hyperglycemia 04/15/2013   Neuropathy 04/15/2013   Hereditary and idiopathic peripheral neuropathy 04/06/2013   Past Medical History:  Diagnosis Date   Arthritis    Cataract    removed bilaterally    Erectile dysfunction    Esophageal reflux    Glaucoma    Hypertriglyceridemia    Impaired fasting glucose    MGUS (monoclonal gammopathy of unknown significance)    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   Nocturnal leg cramps 03/11/2021   Nontoxic uninodular goiter    Obesity    Peripheral neuropathy    RLS (restless legs syndrome)    Spinal stenosis    Unspecified deficiency anemia 07/11/2013    Family History  Problem Relation Age of Onset   Brain cancer Mother    Lung cancer Father    Heart disease Brother        No details   Dementia Brother    Neuropathy Neg Hx    Colon cancer Neg Hx    Colon polyps Neg Hx    Esophageal cancer Neg Hx    Rectal cancer Neg Hx    Stomach cancer Neg Hx     Past Surgical History:  Procedure Laterality Date   COLON RESECTION N/A 04/21/2013   Procedure: DIAGNOSTIC LAPAROSCOPY, lysis of adhesions for partial small bowel obstruction;  Surgeon: Odis Hollingshead, MD;  Location: [facility];  Service: General;   Laterality: N/A;   COLON SURGERY     partial SBO    COLONOSCOPY     CORONARY STENT INTERVENTION N/A 10/22/2021   Procedure: CORONARY STENT INTERVENTION;  Surgeon: Leonie Man, MD;  Location: [facility];  Service: Cardiovascular;  Laterality: N/A;   KNEE SURGERY     Arthroscopic   LEFT HEART CATH AND CORONARY ANGIOGRAPHY N/A 10/22/2021   Procedure: LEFT HEART CATH AND CORONARY ANGIOGRAPHY;  Surgeon: Leonie Man, MD;  Location: [facility];  Service: Cardiovascular;  Laterality: N/A;   SMALL INTESTINE SURGERY  Blockage   Social History   Occupational History   Occupation: still working  Tobacco Use   Smoking status: Former    Types: Cigarettes    Quit date: 06/01/1984    Years since quitting: 37.4   Smokeless tobacco: Never  Substance and Sexual Activity   Alcohol use: Yes    Alcohol/week: 17.0 standard drinks    Types: 7 Glasses of wine, 10 Cans of beer per week   Drug use: No   Sexual activity: Not on file

## 2021-10-29 NOTE — Progress Notes (Unsigned)
Cardiology Office Note:    Date:  10/30/2021   ID:  Daniel Whitaker, DOB 21-Apr-1946, MRN [id number]  PCP:  [name], MD [place] Cardiologist: Minus Breeding, MD   Reason for visit: PCI follow-up  History of Present Illness:    Daniel Whitaker is a 76 y.o. male with a hx of hyperlipidemia, obesity.  He was seen for dyspnea on exertion.  No chest pain.  Shortness of breath at secondary to obesity and deconditioning.  Exercise treadmill test ordered and showed ST depression and frequent PVCs.  Left heart cath recommended and showed 85% prox LAD disease - DES placed with 0% residual stenosis.  Normal EF.  Plan for DAPT x1 year.  Crestor 40 mg and low-dose Coreg started.  Today, patient states that shortness of breath is greatly improved post PCI.  No chest pain.  Prior to the procedure his blood pressure was running 120s to 140s over 70s to 80s.  Now after the addition of carvedilol, blood pressure good at 120/60.  He denies any cost issues with his medications.  No bleeding with aspirin and Plavix.  No planned upcoming procedures.  We discussed cardiac rehab.  He currently works full-time as a Probation officer.  He golfs weekly.    Past Medical History:  Diagnosis Date   Arthritis    Cataract    removed bilaterally    Erectile dysfunction    Esophageal reflux    Glaucoma    Hypertriglyceridemia    Impaired fasting glucose    MGUS (monoclonal gammopathy of unknown significance)    Neuropathy associated with MGUS ([place]) 07/11/2013   Nocturnal leg cramps 03/11/2021   Nontoxic uninodular goiter    Obesity    Peripheral neuropathy    RLS (restless legs syndrome)    Spinal stenosis    Unspecified deficiency anemia 07/11/2013    Past Surgical History:  Procedure Laterality Date   COLON RESECTION N/A 04/21/2013   Procedure: DIAGNOSTIC LAPAROSCOPY, lysis of adhesions for partial small bowel obstruction;  Surgeon: Odis Hollingshead, MD;  Location: [facility];   Service: General;  Laterality: N/A;   COLON SURGERY     partial SBO    COLONOSCOPY     CORONARY STENT INTERVENTION N/A 10/22/2021   Procedure: CORONARY STENT INTERVENTION;  Surgeon: Leonie Man, MD;  Location: [facility];  Service: Cardiovascular;  Laterality: N/A;   KNEE SURGERY     Arthroscopic   LEFT HEART CATH AND CORONARY ANGIOGRAPHY N/A 10/22/2021   Procedure: LEFT HEART CATH AND CORONARY ANGIOGRAPHY;  Surgeon: Leonie Man, MD;  Location: [facility];  Service: Cardiovascular;  Laterality: N/A;   SMALL INTESTINE SURGERY     Blockage    Current Medications: Current Meds  Medication Sig   aspirin EC 81 MG tablet Take 1 tablet (81 mg total) by mouth daily. Swallow whole.   brimonidine (ALPHAGAN) 0.2 % ophthalmic solution Place 1 drop into both eyes in the morning and at bedtime.   carvedilol (COREG) 3.125 MG tablet Take 1 tablet (3.125 mg total) by mouth 2 (two) times daily.   cetirizine (ZYRTEC) 10 MG tablet Take 10 mg by mouth every morning.   Cholecalciferol (VITAMIN D) 2000 UNITS CAPS Take 2,000 Units by mouth daily.   clopidogrel (PLAVIX) 75 MG tablet Take 1 tablet (75 mg total) by mouth daily.   Cyanocobalamin (VITAMIN B-12) 5000 MCG TBDP Take 10,000 mcg by mouth every morning.   dorzolamide-timolol (COSOPT) 22.3-6.8 MG/ML  ophthalmic solution Place 1 drop into the left eye 2 (two) times daily.   ketoconazole (NIZORAL) 2 % cream 1 application. daily as needed (Dry skin).   latanoprost (XALATAN) 0.005 % ophthalmic solution Place 1 drop into both eyes 2 (two) times daily.   lidocaine (XYLOCAINE) 5 % ointment Apply 1 application topically 2 (two) times daily as needed. (Patient taking differently: Apply 1 application. topically 2 (two) times daily as needed (neuropathy).)   MAGNESIUM PO Take 1 tablet by mouth daily.   Multiple Vitamins-Minerals (CENTRUM) tablet Take 1 tablet by mouth daily. 50 +   nitroGLYCERIN (NITROSTAT) 0.4 MG SL tablet Place 1 tablet (0.4  mg total) under the tongue every 5 (five) minutes as needed.   Omega-3 Fatty Acids (FISH OIL) 1200 MG CAPS Take 3,600 mg by mouth daily. 3   pantoprazole (PROTONIX) 40 MG tablet Take 40 mg by mouth daily.    pregabalin (LYRICA) 200 MG capsule Take 1 capsule (200 mg total) by mouth 2 (two) times daily.   rOPINIRole (REQUIP) 2 MG tablet Take 1 tablet at 5 Pm and 1/2 tablet at bedtime   rosuvastatin (CRESTOR) 40 MG tablet Take 1 tablet (40 mg total) by mouth daily.   sildenafil (REVATIO) 20 MG tablet Take 20-100 mg by mouth daily as needed (ED).     Allergies:   Atorvastatin, Cymbalta [duloxetine hcl], and Penicillins   Social History   Socioeconomic History   Marital status: Married    Spouse name: [name]   Number of children: 2   Years of education: GED   Highest education level: Not on file  Occupational History   Occupation: still working  Tobacco Use   Smoking status: Former    Types: Cigarettes    Quit date: 06/01/1984    Years since quitting: 37.4   Smokeless tobacco: Never  Substance and Sexual Activity   Alcohol use: Yes    Alcohol/week: 17.0 standard drinks    Types: 7 Glasses of wine, 10 Cans of beer per week   Drug use: No   Sexual activity: Not on file  Other Topics Concern   Not on file  Social History Narrative   Patient lives at home with his wife [name])   Retired.    Patient has two children.   Patient is right-handed.   Patient has his GED.   Patient does not drink caffeine.   Social Determinants of Health   Financial Resource Strain: Not on file  Food Insecurity: Not on file  Transportation Needs: Not on file  Physical Activity: Not on file  Stress: Not on file  Social Connections: Not on file     Family History: The patient's family history includes Brain cancer in his mother; Dementia in his brother; Heart disease in his brother; Lung cancer in his father. There is no history of Neuropathy, Colon cancer, Colon polyps, Esophageal cancer, Rectal  cancer, or Stomach cancer.  ROS:   Please see the history of present illness.     EKGs/Labs/Other Studies Reviewed:    EKG:  The ekg ordered today demonstrates normal sinus rhythm with heart rate 66.  Recent Labs: 02/11/2021: ALT 16 10/20/2021: Hemoglobin 14.1; Platelets 199 10/22/2021: BUN 18; Creatinine, Ser 1.04; Potassium 5.0; Sodium 143   Recent Lipid Panel No results found for: CHOL, TRIG, HDL, LDLCALC, LDLDIRECT  Physical Exam:    VS:  BP 120/60 (BP Location: Left Arm)   Pulse 66   Ht '6\' 1"'$  (1.854 m)   Wt 245 lb  9.6 oz (111.4 kg)   SpO2 98%   BMI 32.40 kg/m    No data found.  Wt Readings from Last 3 Encounters:  10/30/21 245 lb 9.6 oz (111.4 kg)  10/29/21 248 lb (112.5 kg)  10/22/21 242 lb (109.8 kg)     GEN:  Well nourished, well developed in no acute distress HEENT: Normal NECK: No JVD; No carotid bruits CARDIAC: RRR, no murmurs, rubs, gallops RESPIRATORY:  Clear to auscultation without rales, wheezing or rhonchi  ABDOMEN: Soft, non-tender, non-distended MUSCULOSKELETAL: No edema; No deformity  SKIN: Warm and dry; right radial access well-healed. NEUROLOGIC:  Alert and oriented PSYCHIATRIC:  Normal affect     ASSESSMENT AND PLAN   Coronary artery disease with no angina -S/p DES to LAD in May 2023 following abnormal stress test -Continue DAPT x1 year.   -Continue beta-blocker and statin therapy. -Recommend cardiac rehab.  Hypertension, well controlled -mild LVH on echo 10/2020, normal EF -Continue carvedilol. -Goal BP is <130/80.  Recommend DASH diet (high in vegetables, fruits, low-fat dairy products, whole grains, poultry, fish, and nuts and low in sweets, sugar-sweetened beverages, and red meats), salt restriction and increase physical activity.  Hyperlipidemia with LDL goal less than 70 -LDL 112 in May 2023. -Continue Crestor 40 mg daily.  Recheck fasting lipids in 3 months. -Discussed cholesterol lowering diets - Mediterranean diet, DASH diet,  vegetarian diet, low-carbohydrate diet and avoidance of trans fats.  Discussed healthier choice substitutes.  Nuts, high-fiber foods, and fiber supplements may also improve lipids.    Obesity -Discussed how even a 5-10% weight loss can have cardiovascular benefits.   -Recommend moderate intensity activity for 30 minutes 5 days/week and the DASH diet.  Disposition - Follow-up in 6 months with Dr. Percival Spanish.    Medication Adjustments/Labs and Tests Ordered: Current medicines are reviewed at length with the patient today.  Concerns regarding medicines are outlined above.  No orders of the defined types were placed in this encounter.  No orders of the defined types were placed in this encounter.   There are no Patient Instructions on file for this visit.   Signed, Warren Lacy, PA-C  10/30/2021 10:41 AM    [facility]

## 2021-10-30 ENCOUNTER — Encounter: Payer: Self-pay | Admitting: Physician Assistant

## 2021-10-30 ENCOUNTER — Telehealth: Payer: Self-pay | Admitting: Physician Assistant

## 2021-10-30 ENCOUNTER — Ambulatory Visit: Payer: Medicare Other | Admitting: Physician Assistant

## 2021-10-30 VITALS — BP 120/60 | HR 66 | Ht 73.0 in | Wt 245.6 lb

## 2021-10-30 DIAGNOSIS — I517 Cardiomegaly: Secondary | ICD-10-CM

## 2021-10-30 DIAGNOSIS — I2510 Atherosclerotic heart disease of native coronary artery without angina pectoris: Secondary | ICD-10-CM | POA: Diagnosis not present

## 2021-10-30 DIAGNOSIS — I10 Essential (primary) hypertension: Secondary | ICD-10-CM | POA: Diagnosis not present

## 2021-10-30 DIAGNOSIS — E785 Hyperlipidemia, unspecified: Secondary | ICD-10-CM

## 2021-10-30 NOTE — Patient Instructions (Signed)
Medication Instructions:  No Changes *If you need a refill on your cardiac medications before your next appointment, please call your pharmacy*   Lab Work: No Labs If you have labs (blood work) drawn today and your tests are completely normal, you will receive your results only by: [place] (if you have MyChart) OR A paper copy in the mail If you have any lab test that is abnormal or we need to change your treatment, we will call you to review the results.   Testing/Procedures: No Testing   Follow-Up: At [facility], you and your health needs are our priority.  As part of our continuing mission to provide you with exceptional heart care, we have created designated Provider Care Teams.  These Care Teams include your primary Cardiologist (physician) and Advanced Practice Providers (APPs -  Physician Assistants and Nurse Practitioners) who all work together to provide you with the care you need, when you need it.  We recommend signing up for the patient portal called "MyChart".  Sign up information is provided on this After Visit Summary.  MyChart is used to connect with patients for Virtual Visits (Telemedicine).  Patients are able to view lab/test results, encounter notes, upcoming appointments, etc.  Non-urgent messages can be sent to your provider as well.   To learn more about what you can do with MyChart, go to [email or web address].    Your next appointment:   6 month(s)  The format for your next appointment:   In Person  Provider:   Minus Breeding, MD       Important Information About Sugar

## 2021-10-30 NOTE — Telephone Encounter (Signed)
Patient told me that he received denial notice for left heart cath.   Called his insurance company.  It appears they did not receive abnormal stress test results or recent office note.  Faxed office note & stress test results to [employer] at [phone].  Status check at phone: [phone].  Caron Presume PA-C

## 2021-11-03 ENCOUNTER — Telehealth (HOSPITAL_COMMUNITY): Payer: Self-pay

## 2021-11-03 NOTE — Telephone Encounter (Signed)
Attempted to call patient in regards to Cardiac Rehab - LM on VM 

## 2021-11-03 NOTE — Telephone Encounter (Signed)
Pt insurance is active and benefits verified through [facility]. Co-pay $0.00, DED $0.00/$0.00 met, out of pocket $0.00/$0.00 met, co-insurance 0%. No pre-authorization required. Passport, 11/03/21 @ 11:24AM, HCW#[id number]   How many CR sessions are covered? (36 sessions for TCR, 72 sessions for ICR)N/A Is this a lifetime maximum or an annual maximum? N/A Has the member used any of these services to date? N/A Is there a time limit (weeks/months) on start of program and/or program completion? N/A     Will contact patient to see if he is interested in the Cardiac Rehab Program. If interested, patient will need to complete follow up appt. Once completed, patient will be contacted for scheduling upon review by the RN Navigator.

## 2021-11-18 ENCOUNTER — Other Ambulatory Visit (HOSPITAL_COMMUNITY): Payer: Self-pay

## 2021-11-21 ENCOUNTER — Telehealth: Payer: Self-pay | Admitting: Cardiology

## 2021-11-21 MED ORDER — CLOPIDOGREL BISULFATE 75 MG PO TABS: 75.0000 mg | ORAL_TABLET | Freq: Every day | ORAL | 1 refills | Status: DC

## 2021-11-21 NOTE — Telephone Encounter (Signed)
Refill sent to pharmacy.   

## 2021-11-28 DIAGNOSIS — M79642 Pain in left hand: Secondary | ICD-10-CM | POA: Diagnosis not present

## 2021-12-04 ENCOUNTER — Telehealth: Payer: Self-pay | Admitting: *Deleted

## 2021-12-04 DIAGNOSIS — M79645 Pain in left finger(s): Secondary | ICD-10-CM | POA: Diagnosis not present

## 2021-12-04 NOTE — Telephone Encounter (Signed)
   There is a relative contraindication ibuprofen particularly with the blood thinners he is taking and a small risk of increased cardiovascular events.  We would prefer Tylenol.   Minus Breeding, MD

## 2021-12-04 NOTE — Telephone Encounter (Signed)
Patient walked into the office from [facility].  Per walk-in  patient form ,  Patient states he had a stent  placed @ 5 weeks ago.   Patient states  Ortho informed he could not take  Ibuprofen  due to the stent being placed.  Patient wants to verify if this is so  and if he can not take Ibuprofen what would be the alternative from a cardiac standpoint.  Will defer to doctor and/or pharmacy and then call patient back

## 2021-12-04 NOTE — Telephone Encounter (Signed)
Spoke to patient .   Patient aware Dr Percival Spanish recommends using Tylenol  or the generic brand Patient voiced understanding

## 2021-12-10 DIAGNOSIS — H401123 Primary open-angle glaucoma, left eye, severe stage: Secondary | ICD-10-CM | POA: Diagnosis not present

## 2021-12-12 DIAGNOSIS — M25642 Stiffness of left hand, not elsewhere classified: Secondary | ICD-10-CM | POA: Diagnosis not present

## 2021-12-15 ENCOUNTER — Encounter (HOSPITAL_COMMUNITY): Payer: Self-pay

## 2021-12-18 ENCOUNTER — Other Ambulatory Visit: Payer: Self-pay | Admitting: Cardiology

## 2021-12-19 ENCOUNTER — Telehealth: Payer: Self-pay

## 2021-12-19 NOTE — Telephone Encounter (Signed)
VOB submitted for Durolane, left knee. VOB pending

## 2021-12-22 ENCOUNTER — Encounter: Payer: Self-pay | Admitting: Physician Assistant

## 2021-12-22 ENCOUNTER — Ambulatory Visit: Payer: Medicare Other | Admitting: Physician Assistant

## 2021-12-22 DIAGNOSIS — M1712 Unilateral primary osteoarthritis, left knee: Secondary | ICD-10-CM

## 2021-12-22 NOTE — Progress Notes (Signed)
                                                                                                                                        HPI: Daniel Whitaker comes in today due to left knee pain.  He has known tricompartmental arthritis left knee.  He states the left knee pain is getting worse.  He was last seen in May and was given a cortisone injection and states this only lasted about a week.  Unfortunately the supplemental injection has not been approved as of yet.  We will have him back once this is approved.  No charge for today's office visit.

## 2022-01-02 ENCOUNTER — Telehealth (HOSPITAL_COMMUNITY): Payer: Self-pay | Admitting: *Deleted

## 2022-01-05 ENCOUNTER — Telehealth (HOSPITAL_COMMUNITY): Payer: Self-pay

## 2022-01-05 ENCOUNTER — Telehealth (HOSPITAL_COMMUNITY): Payer: Self-pay | Admitting: *Deleted

## 2022-01-05 NOTE — Telephone Encounter (Signed)
Confirmed orientation date and time with patient, as well  as directions and other necessary instructions; no further questions at this time.

## 2022-01-06 ENCOUNTER — Encounter (HOSPITAL_COMMUNITY): Payer: Self-pay

## 2022-01-06 ENCOUNTER — Encounter (HOSPITAL_COMMUNITY)
Admission: RE | Admit: 2022-01-06 | Discharge: 2022-01-06 | Disposition: A | Discharge status: Home / Self Care | Payer: Medicare Other | Source: Ambulatory Visit | Attending: Cardiology | Admitting: Cardiology

## 2022-01-06 ENCOUNTER — Other Ambulatory Visit: Payer: Self-pay

## 2022-01-06 VITALS — BP 133/70 | HR 71 | Ht 71.75 in | Wt 258.2 lb

## 2022-01-06 DIAGNOSIS — Z7902 Long term (current) use of antithrombotics/antiplatelets: Secondary | ICD-10-CM | POA: Insufficient documentation

## 2022-01-06 DIAGNOSIS — T162XXA Foreign body in left ear, initial encounter: Secondary | ICD-10-CM | POA: Diagnosis not present

## 2022-01-06 DIAGNOSIS — Z955 Presence of coronary angioplasty implant and graft: Secondary | ICD-10-CM | POA: Insufficient documentation

## 2022-01-06 DIAGNOSIS — Z7982 Long term (current) use of aspirin: Secondary | ICD-10-CM | POA: Diagnosis not present

## 2022-01-06 DIAGNOSIS — X58XXXA Exposure to other specified factors, initial encounter: Secondary | ICD-10-CM | POA: Diagnosis not present

## 2022-01-06 DIAGNOSIS — Z48812 Encounter for surgical aftercare following surgery on the circulatory system: Secondary | ICD-10-CM | POA: Insufficient documentation

## 2022-01-06 HISTORY — DX: Atherosclerotic heart disease of native coronary artery without angina pectoris: I25.10

## 2022-01-06 NOTE — Progress Notes (Addendum)
Cardiac Rehab Medication Review by a Nurse   Does the patient  feel that his/her medications are working for him/her?  YES   Has the patient been experiencing any side effects to the medications prescribed?  NO   Does the patient measure his/her own blood pressure or blood glucose at home?  NO    Does the patient have any problems obtaining medications due to transportation or finances?   NO   Understanding of regimen: good Understanding of indications: good Potential of compliance: good      Nurse comments: Patient taking medications as prescribed, pt not currently monitoring B/P at home d/t cuff being "too small", patient has a good understanding of what medications are for.  Albertine Grates RN 8/8/202310:28 AM

## 2022-01-06 NOTE — Progress Notes (Addendum)
Cardiac Individual Treatment Plan  Patient Details  Name: Daniel Whitaker MRN: [id number] Date of Birth: 1945/08/31 Referring Provider:   Flowsheet Row INTENSIVE CARDIAC REHAB ORIENT from 01/06/2022 in [place]  Referring Provider Minus Breeding, MD       Initial Encounter Date:  [place] from 01/06/2022 in [place]  Date 01/06/22       Visit Diagnosis: 10/22/21 DES LAD  Patient's Home Medications on Admission:  Current Outpatient Medications:    aspirin EC 81 MG tablet, Take 1 tablet (81 mg total) by mouth daily. Swallow whole., Disp: 30 tablet, Rfl: 0   brimonidine (ALPHAGAN) 0.2 % ophthalmic solution, Place 1 drop into both eyes in the morning and at bedtime., Disp: , Rfl:    carvedilol (COREG) 3.125 MG tablet, Take 1 tablet (3.125 mg total) by mouth 2 (two) times daily., Disp: 60 tablet, Rfl: 4   cetirizine (ZYRTEC) 10 MG tablet, Take 10 mg by mouth every morning., Disp: , Rfl:    Cholecalciferol (VITAMIN D) 2000 UNITS CAPS, Take 2,000 Units by mouth daily., Disp: , Rfl:    clopidogrel (PLAVIX) 75 MG tablet, Take 1 tablet (75 mg total) by mouth daily., Disp: 90 tablet, Rfl: 1   Cyanocobalamin (VITAMIN B-12) 5000 MCG TBDP, Take 10,000 mcg by mouth every morning., Disp: , Rfl:    dorzolamide-timolol (COSOPT) 22.3-6.8 MG/ML ophthalmic solution, Place 1 drop into the left eye 2 (two) times daily., Disp: , Rfl:    ketoconazole (NIZORAL) 2 % cream, Apply 1 application  topically daily as needed for irritation., Disp: , Rfl:    latanoprost (XALATAN) 0.005 % ophthalmic solution, Place 1 drop into both eyes at bedtime., Disp: , Rfl:    lidocaine (XYLOCAINE) 5 % ointment, Apply 1 application topically 2 (two) times daily as needed. (Patient taking differently: Apply 1 application  topically 2 (two) times daily as needed (neuropathy).), Disp: 35.44 g, Rfl: 3   MAGNESIUM PO, Take 400 mg by  mouth daily., Disp: , Rfl:    Multiple Vitamins-Minerals (CENTRUM) tablet, Take 1 tablet by mouth daily. 50 +, Disp: , Rfl:    nitroGLYCERIN (NITROSTAT) 0.4 MG SL tablet, Place 1 tablet (0.4 mg total) under the tongue every 5 (five) minutes as needed., Disp: 25 tablet, Rfl: 2   Omega-3 Fatty Acids (FISH OIL) 1200 MG CAPS, Take 3,600 mg by mouth daily. 3, Disp: , Rfl:    pantoprazole (PROTONIX) 40 MG tablet, Take 40 mg by mouth daily. , Disp: , Rfl:    pregabalin (LYRICA) 200 MG capsule, Take 1 capsule (200 mg total) by mouth 2 (two) times daily., Disp: 180 capsule, Rfl: 1   rOPINIRole (REQUIP) 2 MG tablet, Take 1 tablet at 5 Pm and 1/2 tablet at bedtime, Disp: 135 tablet, Rfl: 3   rosuvastatin (CRESTOR) 40 MG tablet, Take 1 tablet (40 mg total) by mouth daily., Disp: 90 tablet, Rfl: 1   sildenafil (REVATIO) 20 MG tablet, Take 20-100 mg by mouth daily as needed (ED)., Disp: , Rfl:   Past Medical History: Past Medical History:  Diagnosis Date   Arthritis    Cataract    removed bilaterally    Coronary artery disease    Erectile dysfunction    Esophageal reflux    Glaucoma    Hypertriglyceridemia    Impaired fasting glucose    MGUS (monoclonal gammopathy of unknown significance)    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  Nocturnal leg cramps 03/11/2021   Nontoxic uninodular goiter    Obesity    Peripheral neuropathy    RLS (restless legs syndrome)    Spinal stenosis    Unspecified deficiency anemia 07/11/2013    Tobacco Use: Social History   Tobacco Use  Smoking Status Former   Types: Cigarettes   Quit date: 06/01/1984   Years since quitting: 37.6  Smokeless Tobacco Never    Labs: Review Flowsheet       Latest Ref Rng & Units 04/15/2013  Labs for ITP Cardiac and Pulmonary Rehab  Hemoglobin A1c <5.7 % 5.2     Capillary Blood Glucose: Lab Results  Component Value Date   GLUCAP 112 (H) 04/18/2013   GLUCAP 102 (H) 04/18/2013   GLUCAP 111 (H) 04/17/2013    GLUCAP 119 (H) 04/17/2013   GLUCAP 106 (H) 04/17/2013     Exercise Target Goals: Exercise Program Goal: Individual exercise prescription set using results from initial 6 min walk test and THRR while considering  patient's activity barriers and safety.   Exercise Prescription Goal: Initial exercise prescription builds to 30-45 minutes a day of aerobic activity, 2-3 days per week.  Home exercise guidelines will be given to patient during program as part of exercise prescription that the participant will acknowledge.  Activity Barriers & Risk Stratification:  Activity Barriers & Cardiac Risk Stratification - 01/06/22 0903       Activity Barriers & Cardiac Risk Stratification   Activity Barriers Arthritis;Back Problems;History of Falls;Balance Concerns;Other (comment)    Comments Neuropathy in both feet, "bad" left knee, gets injections; physical therapy for recent left hand injury; hx crushed vertebrae, back pain, gets injections.    Cardiac Risk Stratification Low             6 Minute Walk:  6 Minute Walk     Row Name 01/06/22 0914         6 Minute Walk   Phase Initial     Distance 1766 feet     Walk Time 6 minutes     # of Rest Breaks 0     MPH 3.34     METS 3.4     RPE 12     Perceived Dyspnea  1     VO2 Peak 11.91     Symptoms Yes (comment)     Comments Patient c/o lower, left leg pain, 4/10 on the pain scale; mild SOB, RPD=1.     Resting HR 71 bpm     Resting BP 133/70     Resting Oxygen Saturation  98 %     Exercise Oxygen Saturation  during 6 min walk 95 %     Max Ex. HR 104 bpm     Max Ex. BP 178/60     2 Minute Post BP 166/80              Oxygen Initial Assessment:   Oxygen Re-Evaluation:   Oxygen Discharge (Final Oxygen Re-Evaluation):   Initial Exercise Prescription:  Initial Exercise Prescription - 01/06/22 0900       Date of Initial Exercise RX and Referring Provider   Date 01/06/22    Referring Provider Minus Breeding, MD     Expected Discharge Date 03/13/22      Recumbant Bike   Level 1.5    Minutes 15    METs 2      NuStep   Level 2    SPM 85    Minutes 15  METs 2      Prescription Details   Frequency (times per week) 3    Duration Progress to 30 minutes of continuous aerobic without signs/symptoms of physical distress      Intensity   THRR 40-80% of Max Heartrate 58-116    Ratings of Perceived Exertion 11-13    Perceived Dyspnea 0-4      Progression   Progression Continue to progress workloads to maintain intensity without signs/symptoms of physical distress.      Resistance Training   Training Prescription Yes    Weight 3 lbs    Reps 10-15             Perform Capillary Blood Glucose checks as needed.  Exercise Prescription Changes:   Exercise Comments:   Exercise Goals and Review:   Exercise Goals     Row Name 01/06/22 0846             Exercise Goals   Increase Physical Activity Yes       Intervention Provide advice, education, support and counseling about physical activity/exercise needs.;Develop an individualized exercise prescription for aerobic and resistive training based on initial evaluation findings, risk stratification, comorbidities and participant's personal goals.       Expected Outcomes Short Term: Attend rehab on a regular basis to increase amount of physical activity.;Long Term: Exercising regularly at least 3-5 days a week.;Long Term: Add in home exercise to make exercise part of routine and to increase amount of physical activity.       Increase Strength and Stamina Yes       Intervention Provide advice, education, support and counseling about physical activity/exercise needs.;Develop an individualized exercise prescription for aerobic and resistive training based on initial evaluation findings, risk stratification, comorbidities and participant's personal goals.       Expected Outcomes Short Term: Increase workloads from initial exercise prescription for  resistance, speed, and METs.;Short Term: Perform resistance training exercises routinely during rehab and add in resistance training at home;Long Term: Improve cardiorespiratory fitness, muscular endurance and strength as measured by increased METs and functional capacity (6MWT)       Able to understand and use rate of perceived exertion (RPE) scale Yes       Intervention Provide education and explanation on how to use RPE scale       Expected Outcomes Short Term: Able to use RPE daily in rehab to express subjective intensity level;Long Term:  Able to use RPE to guide intensity level when exercising independently       Knowledge and understanding of Target Heart Rate Range (THRR) Yes       Intervention Provide education and explanation of THRR including how the numbers were predicted and where they are located for reference       Expected Outcomes Short Term: Able to state/look up THRR;Long Term: Able to use THRR to govern intensity when exercising independently;Short Term: Able to use daily as guideline for intensity in rehab       Able to check pulse independently Yes       Intervention Provide education and demonstration on how to check pulse in carotid and radial arteries.;Review the importance of being able to check your own pulse for safety during independent exercise       Expected Outcomes Short Term: Able to explain why pulse checking is important during independent exercise;Long Term: Able to check pulse independently and accurately       Understanding of Exercise Prescription Yes  Intervention Provide education, explanation, and written materials on patient's individual exercise prescription       Expected Outcomes Short Term: Able to explain program exercise prescription;Long Term: Able to explain home exercise prescription to exercise independently                Exercise Goals Re-Evaluation :   Discharge Exercise Prescription (Final Exercise Prescription  Changes):   Nutrition:  Target Goals: Understanding of nutrition guidelines, daily intake of sodium '1500mg'$ , cholesterol '200mg'$ , calories 30% from fat and 7% or less from saturated fats, daily to have 5 or more servings of fruits and vegetables.  Biometrics:  Pre Biometrics - 01/06/22 0834       Pre Biometrics   Waist Circumference 48 inches    Hip Circumference 47 inches    Waist to Hip Ratio 1.02 %    Triceps Skinfold 20 mm    % Body Fat 35.1 %    Grip Strength 34 kg    Flexibility --   Not performed, issues with back pain.   Single Leg Stand 1.5 seconds              Nutrition Therapy Plan and Nutrition Goals:   Nutrition Assessments:  MEDIFICTS Score Key: ?70 Need to make dietary changes  40-70 Heart Healthy Diet ? 40 Therapeutic Level Cholesterol Diet    Picture Your Plate Scores: <45 Unhealthy dietary pattern with much room for improvement. 41-50 Dietary pattern unlikely to meet recommendations for good health and room for improvement. 51-60 More healthful dietary pattern, with some room for improvement.  >60 Healthy dietary pattern, although there may be some specific behaviors that could be improved.    Nutrition Goals Re-Evaluation:   Nutrition Goals Re-Evaluation:   Nutrition Goals Discharge (Final Nutrition Goals Re-Evaluation):   Psychosocial: Target Goals: Acknowledge presence or absence of significant depression and/or stress, maximize coping skills, provide positive support system. Participant is able to verbalize types and ability to use techniques and skills needed for reducing stress and depression.  Initial Review & Psychosocial Screening:  Initial Psych Review & Screening - 01/06/22 1031       Initial Review   Current issues with None Identified      Family Dynamics   Good Support System? Yes    Comments Patient reports good support system with his wife, 2 children and his grandchildren.      Barriers   Psychosocial barriers to  participate in program There are no identifiable barriers or psychosocial needs.             Quality of Life Scores:  Quality of Life - 01/06/22 1012       Quality of Life   Select Quality of Life      Quality of Life Scores   Health/Function Pre 24.4 %    Socioeconomic Pre 28.29 %    Psych/Spiritual Pre 30 %    Family Pre 28.8 %    GLOBAL Pre 27 %            Scores of 19 and below usually indicate a poorer quality of life in these areas.  A difference of  2-3 points is a clinically meaningful difference.  A difference of 2-3 points in the total score of the Quality of Life Index has been associated with significant improvement in overall quality of life, self-image, physical symptoms, and general health in studies assessing change in quality of life.  PHQ-9: Review Flowsheet       01/06/2022  01/22/2014  Depression screen PHQ 2/9  Decreased Interest 0 0  Down, Depressed, Hopeless 0 0  PHQ - 2 Score 0 0   Interpretation of Total Score  Total Score Depression Severity:  1-4 = Minimal depression, 5-9 = Mild depression, 10-14 = Moderate depression, 15-19 = Moderately severe depression, 20-27 = Severe depression   Psychosocial Evaluation and Intervention:   Psychosocial Re-Evaluation:   Psychosocial Discharge (Final Psychosocial Re-Evaluation):   Vocational Rehabilitation: Provide vocational rehab assistance to qualifying candidates.   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 Participant will state understanding/return demonstration of topics presented.     Core  Videos: Exercise    Move It!  Clinical staff conducted group or individual video education with verbal and written material and guidebook.  Patient learns the recommended Pritikin exercise program. Exercise with the goal of living a long, healthy life. Some of the health benefits of exercise include controlled diabetes, healthier blood pressure levels, improved cholesterol levels, improved heart and lung capacity, improved sleep, and better body composition. Everyone should speak with their doctor before starting or changing an exercise routine.  Biomechanical Limitations Clinical staff conducted group or individual video education with verbal and written material and guidebook.  Patient learns how biomechanical limitations can impact exercise and how we can mitigate and possibly overcome limitations to have an impactful and balanced exercise routine.  Body Composition Clinical staff conducted group or individual video education with verbal and written material and guidebook.  Patient learns that body composition (ratio of muscle mass to fat mass) is a key component to assessing overall fitness, rather than body weight alone. Increased fat mass, especially visceral belly fat, can put [place] at increased risk for metabolic syndrome, type 2 diabetes, heart disease, and even death. It is recommended to combine diet and exercise (cardiovascular and resistance training) to improve your body composition. Seek guidance from your physician and exercise physiologist before implementing an exercise routine.  Exercise Action Plan Clinical staff conducted group or individual video education with verbal and written material and guidebook.  Patient learns the recommended strategies to achieve and enjoy long-term exercise adherence, including variety, self-motivation, self-efficacy, and positive decision making. Benefits of exercise include fitness, good health, weight management, more energy, better sleep, less  stress, and overall well-being.  Medical   Heart Disease Risk Reduction Clinical staff conducted group or individual video education with verbal and written material and guidebook.  Patient learns our heart is our most vital organ as it circulates oxygen, nutrients, white blood cells, and hormones throughout the entire body, and carries waste away. Data supports a plant-based eating plan like the Pritikin Program for its effectiveness in slowing progression of and reversing heart disease. The video provides a number of recommendations to address heart disease.   Metabolic Syndrome and Belly Fat  Clinical staff conducted group or individual video education with verbal and written material and guidebook.  Patient learns what metabolic syndrome is, how it leads to heart disease, and how one can reverse it and keep it from coming back. You have metabolic syndrome if you  have 3 of the following 5 criteria: abdominal obesity, high blood pressure, high triglycerides, low HDL cholesterol, and high blood sugar.  Hypertension and Heart Disease Clinical staff conducted group or individual video education with verbal and written material and guidebook.  Patient learns that high blood pressure, or hypertension, is very common in the [place]. Hypertension is largely due to excessive salt intake, but other important risk factors include being overweight, physical inactivity, drinking too much alcohol, smoking, and not eating enough potassium from fruits and vegetables. High blood pressure is a leading risk factor for heart attack, stroke, congestive heart failure, dementia, kidney failure, and premature death. Long-term effects of excessive salt intake include stiffening of the arteries and thickening of heart muscle and organ damage. Recommendations include ways to reduce hypertension and the risk of heart disease.  Diseases of Our Time - Focusing on Diabetes Clinical staff conducted group or individual  video education with verbal and written material and guidebook.  Patient learns why the best way to stop diseases of our time is prevention, through food and other lifestyle changes. Medicine (such as prescription pills and surgeries) is often only a Band-Aid on the problem, not a long-term solution. Most common diseases of our time include obesity, type 2 diabetes, hypertension, heart disease, and cancer. The Pritikin Program is recommended and has been proven to help reduce, reverse, and/or prevent the damaging effects of metabolic syndrome.  Nutrition   Overview of the Pritikin Eating Plan  Clinical staff conducted group or individual video education with verbal and written material and guidebook.  Patient learns about the [place] for disease risk reduction. The [place] emphasizes a wide variety of unrefined, minimally-processed carbohydrates, like fruits, vegetables, whole grains, and legumes. Go, Caution, and Stop food choices are explained. Plant-based and lean animal proteins are emphasized. Rationale provided for low sodium intake for blood pressure control, low added sugars for blood sugar stabilization, and low added fats and oils for coronary artery disease risk reduction and weight management.  Calorie Density  Clinical staff conducted group or individual video education with verbal and written material and guidebook.  Patient learns about calorie density and how it impacts the Pritikin Eating Plan. Knowing the characteristics of the food you choose will help you decide whether those foods will lead to weight gain or weight loss, and whether you want to consume more or less of them. Weight loss is usually a side effect of the Pritikin Eating Plan because of its focus on low calorie-dense foods.  Label Reading  Clinical staff conducted group or individual video education with verbal and written material and guidebook.  Patient learns about the Pritikin recommended  label reading guidelines and corresponding recommendations regarding calorie density, added sugars, sodium content, and whole grains.  Dining Out - Part 1  Clinical staff conducted group or individual video education with verbal and written material and guidebook.  Patient learns that restaurant meals can be sabotaging because they can be so high in calories, fat, sodium, and/or sugar. Patient learns recommended strategies on how to positively address this and avoid unhealthy pitfalls.  Facts on Fats  Clinical staff conducted group or individual video education with verbal and written material and guidebook.  Patient learns that lifestyle modifications can be just as effective, if not more so, as many medications for lowering your risk of heart disease. A Pritikin lifestyle can help to reduce your risk of inflammation and atherosclerosis (cholesterol build-up, or plaque, in the artery walls).  Lifestyle interventions such as dietary choices and physical activity address the cause of atherosclerosis. A review of the types of fats and their impact on blood cholesterol levels, along with dietary recommendations to reduce fat intake is also included.  Nutrition Action Plan  Clinical staff conducted group or individual video education with verbal and written material and guidebook.  Patient learns how to incorporate Pritikin recommendations into their lifestyle. Recommendations include planning and keeping personal health goals in mind as an important part of their success.  Healthy Mind-Set    Healthy Minds, Bodies, Hearts  Clinical staff conducted group or individual video education with verbal and written material and guidebook.  Patient learns how to identify when they are stressed. Video will discuss the impact of that stress, as well as the many benefits of stress management. Patient will also be introduced to stress management techniques. The way we think, act, and feel has an impact on our  hearts.  How Our Thoughts Can Heal Our Hearts  Clinical staff conducted group or individual video education with verbal and written material and guidebook.  Patient learns that negative thoughts can cause depression and anxiety. This can result in negative lifestyle behavior and serious health problems. Cognitive behavioral therapy is an effective method to help control our thoughts in order to change and improve our emotional outlook.  Additional Videos:  Exercise    Improving Performance  Clinical staff conducted group or individual video education with verbal and written material and guidebook.  Patient learns to use a non-linear approach by alternating intensity levels and lengths of time spent exercising to help burn more calories and lose more body fat. Cardiovascular exercise helps improve heart health, metabolism, hormonal balance, blood sugar control, and recovery from fatigue. Resistance training improves strength, endurance, balance, coordination, reaction time, metabolism, and muscle mass. Flexibility exercise improves circulation, posture, and balance. Seek guidance from your physician and exercise physiologist before implementing an exercise routine and learn your capabilities and proper form for all exercise.  Introduction to Yoga  Clinical staff conducted group or individual video education with verbal and written material and guidebook.  Patient learns about yoga, a discipline of the coming together of mind, breath, and body. The benefits of yoga include improved flexibility, improved range of motion, better posture and core strength, increased lung function, weight loss, and positive self-image. Yoga's heart health benefits include lowered blood pressure, healthier heart rate, decreased cholesterol and triglyceride levels, improved immune function, and reduced stress. Seek guidance from your physician and exercise physiologist before implementing an exercise routine and learn your  capabilities and proper form for all exercise.  Medical   Aging: Enhancing Your Quality of Life  Clinical staff conducted group or individual video education with verbal and written material and guidebook.  Patient learns key strategies and recommendations to stay in good physical health and enhance quality of life, such as prevention strategies, having an advocate, securing a [place], and keeping a list of medications and system for tracking them. It also discusses how to avoid risk for bone loss.  Biology of Weight Control  Clinical staff conducted group or individual video education with verbal and written material and guidebook.  Patient learns that weight gain occurs because we consume more calories than we burn (eating more, moving less). Even if your body weight is normal, you may have higher ratios of fat compared to muscle mass. Too much body fat puts you at increased risk for cardiovascular disease,  heart attack, stroke, type 2 diabetes, and obesity-related cancers. In addition to exercise, following the [place] can help reduce your risk.  Decoding Lab Results  Clinical staff conducted group or individual video education with verbal and written material and guidebook.  Patient learns that lab test reflects one measurement whose values change over time and are influenced by many factors, including medication, stress, sleep, exercise, food, hydration, pre-existing medical conditions, and more. It is recommended to use the knowledge from this video to become more involved with your lab results and evaluate your numbers to speak with your doctor.   Diseases of Our Time - Overview  Clinical staff conducted group or individual video education with verbal and written material and guidebook.  Patient learns that according to the CDC, 50% to 70% of chronic diseases (such as obesity, type 2 diabetes, elevated lipids, hypertension, and heart disease) are  avoidable through lifestyle improvements including healthier food choices, listening to satiety cues, and increased physical activity.  Sleep Disorders Clinical staff conducted group or individual video education with verbal and written material and guidebook.  Patient learns how good quality and duration of sleep are important to overall health and well-being. Patient also learns about sleep disorders and how they impact health along with recommendations to address them, including discussing with a physician.  Nutrition  Dining Out - Part 2 Clinical staff conducted group or individual video education with verbal and written material and guidebook.  Patient learns how to plan ahead and communicate in order to maximize their dining experience in a healthy and nutritious manner. Included are recommended food choices based on the type of restaurant the patient is visiting.   Fueling a Best boy conducted group or individual video education with verbal and written material and guidebook.  There is a strong connection between our food choices and our health. Diseases like obesity and type 2 diabetes are very prevalent and are in large-part due to lifestyle choices. The Pritikin Eating Plan provides plenty of food and hunger-curbing satisfaction. It is easy to follow, affordable, and helps reduce health risks.  Menu Workshop  Clinical staff conducted group or individual video education with verbal and written material and guidebook.  Patient learns that restaurant meals can sabotage health goals because they are often packed with calories, fat, sodium, and sugar. Recommendations include strategies to plan ahead and to communicate with the manager, chef, or server to help order a healthier meal.  Planning Your Eating Strategy  Clinical staff conducted group or individual video education with verbal and written material and guidebook.  Patient learns about the [place] and  its benefit of reducing the risk of disease. The [place] does not focus on calories. Instead, it emphasizes high-quality, nutrient-rich foods. By knowing the characteristics of the foods, we choose, we can determine their calorie density and make informed decisions.  Targeting Your Nutrition Priorities  Clinical staff conducted group or individual video education with verbal and written material and guidebook.  Patient learns that lifestyle habits have a tremendous impact on disease risk and progression. This video provides eating and physical activity recommendations based on your personal health goals, such as reducing LDL cholesterol, losing weight, preventing or controlling type 2 diabetes, and reducing high blood pressure.  Vitamins and Minerals  Clinical staff conducted group or individual video education with verbal and written material and guidebook.  Patient learns different ways to obtain key vitamins and minerals, including through a recommended healthy  diet. It is important to discuss all supplements you take with your doctor.   Healthy Mind-Set    Smoking Cessation  Clinical staff conducted group or individual video education with verbal and written material and guidebook.  Patient learns that cigarette smoking and tobacco addiction pose a serious health risk which affects millions of people. Stopping smoking will significantly reduce the risk of heart disease, lung disease, and many forms of cancer. Recommended strategies for quitting are covered, including working with your doctor to develop a successful plan.  Culinary   Becoming a Financial trader conducted group or individual video education with verbal and written material and guidebook.  Patient learns that cooking at home can be healthy, cost-effective, quick, and puts them in control. Keys to cooking healthy recipes will include looking at your recipe, assessing your equipment needs, planning ahead,  making it simple, choosing cost-effective seasonal ingredients, and limiting the use of added fats, salts, and sugars.  Cooking - Breakfast and Snacks  Clinical staff conducted group or individual video education with verbal and written material and guidebook.  Patient learns how important breakfast is to satiety and nutrition through the entire day. Recommendations include key foods to eat during breakfast to help stabilize blood sugar levels and to prevent overeating at meals later in the day. Planning ahead is also a key component.  Cooking - Human resources officer conducted group or individual video education with verbal and written material and guidebook.  Patient learns eating strategies to improve overall health, including an approach to cook more at home. Recommendations include thinking of animal protein as a side on your plate rather than center stage and focusing instead on lower calorie dense options like vegetables, fruits, whole grains, and plant-based proteins, such as beans. Making sauces in large quantities to freeze for later and leaving the skin on your vegetables are also recommended to maximize your experience.  Cooking - Healthy Salads and Dressing Clinical staff conducted group or individual video education with verbal and written material and guidebook.  Patient learns that vegetables, fruits, whole grains, and legumes are the foundations of the [place]. Recommendations include how to incorporate each of these in flavorful and healthy salads, and how to create homemade salad dressings. Proper handling of ingredients is also covered. Cooking - Soups and [employer] - Soups and Desserts Clinical staff conducted group or individual video education with verbal and written material and guidebook.  Patient learns that Pritikin soups and desserts make for easy, nutritious, and delicious snacks and meal components that are low in sodium, fat, sugar, and  calorie density, while high in vitamins, minerals, and filling fiber. Recommendations include simple and healthy ideas for soups and desserts.   Overview     The Pritikin Solution Program Overview Clinical staff conducted group or individual video education with verbal and written material and guidebook.  Patient learns that the results of the [place] Program have been documented in more than 100 articles published in peer-reviewed journals, and the benefits include reducing risk factors for (and, in some cases, even reversing) high cholesterol, high blood pressure, type 2 diabetes, obesity, and more! An overview of the three key pillars of the Pritikin Program will be covered: eating well, doing regular exercise, and having a healthy mind-set.  WORKSHOPS  Exercise: Exercise Basics: Building Your Action Plan Clinical staff led group instruction and group discussion with PowerPoint presentation and patient guidebook. To enhance the learning environment the use  of posters, models and videos may be added. At the conclusion of this workshop, patients will comprehend the difference between physical activity and exercise, as well as the benefits of incorporating both, into their routine. Patients will understand the FITT (Frequency, Intensity, Time, and Type) principle and how to use it to build an exercise action plan. In addition, safety concerns and other considerations for exercise and cardiac rehab will be addressed by the presenter. The purpose of this lesson is to promote a comprehensive and effective weekly exercise routine in order to improve patients' overall level of fitness.   Managing Heart Disease: Your Path to a Healthier Heart Clinical staff led group instruction and group discussion with PowerPoint presentation and patient guidebook. To enhance the learning environment the use of posters, models and videos may be added.At the conclusion of this workshop, patients will understand the  anatomy and physiology of the heart. Additionally, they will understand how Pritikin's three pillars impact the risk factors, the progression, and the management of heart disease.  The purpose of this lesson is to provide a high-level overview of the heart, heart disease, and how the Pritikin lifestyle positively impacts risk factors.  Exercise Biomechanics Clinical staff led group instruction and group discussion with PowerPoint presentation and patient guidebook. To enhance the learning environment the use of posters, models and videos may be added. Patients will learn how the structural parts of their bodies function and how these functions impact their daily activities, movement, and exercise. Patients will learn how to promote a neutral spine, learn how to manage pain, and identify ways to improve their physical movement in order to promote healthy living. The purpose of this lesson is to expose patients to common physical limitations that impact physical activity. Participants will learn practical ways to adapt and manage aches and pains, and to minimize their effect on regular exercise. Patients will learn how to maintain good posture while sitting, walking, and lifting.  Balance Training and Fall Prevention  Clinical staff led group instruction and group discussion with PowerPoint presentation and patient guidebook. To enhance the learning environment the use of posters, models and videos may be added. At the conclusion of this workshop, patients will understand the importance of their sensorimotor skills (vision, proprioception, and the vestibular system) in maintaining their ability to balance as they age. Patients will apply a variety of balancing exercises that are appropriate for their current level of function. Patients will understand the common causes for poor balance, possible solutions to these problems, and ways to modify their physical environment in order to minimize their  fall risk. The purpose of this lesson is to teach patients about the importance of maintaining balance as they age and ways to minimize their risk of falling.  WORKSHOPS   Nutrition:  Fueling a Scientist, research (physical sciences) led group instruction and group discussion with PowerPoint presentation and patient guidebook. To enhance the learning environment the use of posters, models and videos may be added. Patients will review the foundational principles of the [place] and understand what constitutes a serving size in each of the food groups. Patients will also learn Pritikin-friendly foods that are better choices when away from home and review make-ahead meal and snack options. Calorie density will be reviewed and applied to three nutrition priorities: weight maintenance, weight loss, and weight gain. The purpose of this lesson is to reinforce (in a group setting) the key concepts around what patients are recommended to eat and how to apply these  guidelines when away from home by planning and selecting Pritikin-friendly options. Patients will understand how calorie density may be adjusted for different weight management goals.  Mindful Eating  Clinical staff led group instruction and group discussion with PowerPoint presentation and patient guidebook. To enhance the learning environment the use of posters, models and videos may be added. Patients will briefly review the concepts of the [place] and the importance of low-calorie dense foods. The concept of mindful eating will be introduced as well as the importance of paying attention to internal hunger signals. Triggers for non-hunger eating and techniques for dealing with triggers will be explored. The purpose of this lesson is to provide patients with the opportunity to review the basic principles of the [place], discuss the value of eating mindfully and how to measure internal cues of hunger and fullness using the  Hunger Scale. Patients will also discuss reasons for non-hunger eating and learn strategies to use for controlling emotional eating.  Targeting Your Nutrition Priorities Clinical staff led group instruction and group discussion with PowerPoint presentation and patient guidebook. To enhance the learning environment the use of posters, models and videos may be added. Patients will learn how to determine their genetic susceptibility to disease by reviewing their family history. Patients will gain insight into the importance of diet as part of an overall healthy lifestyle in mitigating the impact of genetics and other environmental insults. The purpose of this lesson is to provide patients with the opportunity to assess their personal nutrition priorities by looking at their family history, their own health history and current risk factors. Patients will also be able to discuss ways of prioritizing and modifying the [place] for their highest risk areas  Menu  Clinical staff led group instruction and group discussion with PowerPoint presentation and patient guidebook. To enhance the learning environment the use of posters, models and videos may be added. Using menus brought in from [employer], or printed from [employer], patients will apply the [place] dining out guidelines that were presented in the [employer] video. Patients will also be able to practice these guidelines in a variety of provided scenarios. The purpose of this lesson is to provide patients with the opportunity to practice hands-on learning of the [place] with actual menus and practice scenarios.  Label Reading Clinical staff led group instruction and group discussion with PowerPoint presentation and patient guidebook. To enhance the learning environment the use of posters, models and videos may be added. Patients will review and discuss the Pritikin label reading guidelines  presented in Pritikin's Label Reading Educational series video. Using fool labels brought in from local grocery stores and markets, patients will apply the label reading guidelines and determine if the packaged food meet the Pritikin guidelines. The purpose of this lesson is to provide patients with the opportunity to review, discuss, and practice hands-on learning of the Pritikin Label Reading guidelines with actual packaged food labels. [place] Workshops are designed to teach patients ways to prepare quick, simple, and affordable recipes at home. The importance of nutrition's role in chronic disease risk reduction is reflected in its emphasis in the overall Pritikin program. By learning how to prepare essential core Pritikin Eating Plan recipes, patients will increase control over what they eat; be able to customize the flavor of foods without the use of added salt, sugar, or fat; and improve the quality of the food they consume. By  learning a set of core recipes which are easily assembled, quickly prepared, and affordable, patients are more likely to prepare more healthy foods at home. These workshops focus on convenient breakfasts, simple entres, side dishes, and desserts which can be prepared with minimal effort and are consistent with nutrition recommendations for cardiovascular risk reduction. Cooking [employer] are taught by a Engineer, materials (RD) who has been trained by the [employer]. The chef or RD has a clear understanding of the importance of minimizing - if not completely eliminating - added fat, sugar, and sodium in recipes. Throughout the series of [place] Workshop sessions, patients will learn about healthy ingredients and efficient methods of cooking to build confidence in their capability to prepare    Cooking School weekly topics:  Adding Flavor- Sodium-Free  Fast and Healthy Breakfasts  Powerhouse Plant-Based  Proteins  Satisfying Salads and Dressings  Simple Sides and Sauces  International Cuisine-Spotlight on the [place] Zones  Delicious Desserts  Savory Soups  Efficiency Cooking - Meals in a Snap  Tasty Appetizers and Snacks  Comforting Weekend Breakfasts  One-Pot Wonders   Fast Evening Meals  Easy [place] (Psychosocial): New Thoughts, New Behaviors Clinical staff led group instruction and group discussion with PowerPoint presentation and patient guidebook. To enhance the learning environment the use of posters, models and videos may be added. Patients will learn and practice techniques for developing effective health and lifestyle goals. Patients will be able to effectively apply the goal setting process learned to develop at least one new personal goal.  The purpose of this lesson is to expose patients to a new skill set of behavior modification techniques such as techniques setting SMART goals, overcoming barriers, and achieving new thoughts and new behaviors.  Managing Moods and Relationships Clinical staff led group instruction and group discussion with PowerPoint presentation and patient guidebook. To enhance the learning environment the use of posters, models and videos may be added. Patients will learn how emotional and chronic stress factors can impact their health and relationships. They will learn healthy ways to manage their moods and utilize positive coping mechanisms. In addition, ICR patients will learn ways to improve communication skills. The purpose of this lesson is to expose patients to ways of understanding how one's mood and health are intimately connected. Developing a healthy outlook can help build positive relationships and connections with others. Patients will understand the importance of utilizing effective communication skills that include actively listening and being heard. They will learn and  understand the importance of the "4 Cs" and especially Connections in fostering of a Healthy Mind-Set.  Healthy Sleep for a Healthy Heart Clinical staff led group instruction and group discussion with PowerPoint presentation and patient guidebook. To enhance the learning environment the use of posters, models and videos may be added. At the conclusion of this workshop, patients will be able to demonstrate knowledge of the importance of sleep to overall health, well-being, and quality of life. They will understand the symptoms of, and treatments for, common sleep disorders. Patients will also be able to identify daytime and nighttime behaviors which impact sleep, and they will be able to apply these tools to help manage sleep-related challenges. The purpose of this lesson is to provide patients with a general overview of sleep and outline the importance of quality sleep. Patients will learn about a few of the most common sleep disorders. Patients will also be introduced  to the concept of "sleep hygiene," and discover ways to self-manage certain sleeping problems through simple daily behavior changes. Finally, the workshop will motivate patients by clarifying the links between quality sleep and their goals of heart-healthy living.   Recognizing and Reducing Stress Clinical staff led group instruction and group discussion with PowerPoint presentation and patient guidebook. To enhance the learning environment the use of posters, models and videos may be added. At the conclusion of this workshop, patients will be able to understand the types of stress reactions, differentiate between acute and chronic stress, and recognize the impact that chronic stress has on their health. They will also be able to apply different coping mechanisms, such as reframing negative self-talk. Patients will have the opportunity to practice a variety of stress management techniques, such as deep abdominal breathing, progressive muscle  relaxation, and/or guided imagery.  The purpose of this lesson is to educate patients on the role of stress in their lives and to provide healthy techniques for coping with it.  Learning Barriers/Preferences:  Learning Barriers/Preferences - 01/06/22 1006       Learning Barriers/Preferences   Learning Barriers None    Learning Preferences Verbal Instruction;Written Material             Education Topics:  Knowledge Questionnaire Score:   Core Components/Risk Factors/Patient Goals at Admission:  Personal Goals and Risk Factors at Admission - 01/06/22 0848       Core Components/Risk Factors/Patient Goals on Admission    Weight Management Yes;Obesity;Weight Loss    Intervention Weight Management/Obesity: Establish reasonable short term and long term weight goals.;Obesity: Provide education and appropriate resources to help participant work on and attain dietary goals.    Admit Weight 258 lb 3.2 oz (117.1 kg)    Goal Weight: Short Term 250 lb (113.4 kg)    Goal Weight: Long Term 220 lb (99.8 kg)    Expected Outcomes Short Term: Continue to assess and modify interventions until short term weight is achieved;Long Term: Adherence to nutrition and physical activity/exercise program aimed toward attainment of established weight goal;Weight Loss: Understanding of general recommendations for a balanced deficit meal plan, which promotes 1-2 lb weight loss per week and includes a negative energy balance of [phone] kcal/d    Hypertension Yes    Intervention Provide education on lifestyle modifcations including regular physical activity/exercise, weight management, moderate sodium restriction and increased consumption of fresh fruit, vegetables, and low fat dairy, alcohol moderation, and smoking cessation.;Monitor prescription use compliance.    Expected Outcomes Short Term: Continued assessment and intervention until BP is < 140/59m HG in hypertensive participants. < 130/861mHG in hypertensive  participants with diabetes, heart failure or chronic kidney disease.;Long Term: Maintenance of blood pressure at goal levels.    Lipids Yes    Intervention Provide education and support for participant on nutrition & aerobic/resistive exercise along with prescribed medications to achieve LDL '70mg'$ , HDL >'40mg'$ .    Expected Outcomes Short Term: Participant states understanding of desired cholesterol values and is compliant with medications prescribed. Participant is following exercise prescription and nutrition guidelines.;Long Term: Cholesterol controlled with medications as prescribed, with individualized exercise RX and with personalized nutrition plan. Value goals: LDL < '70mg'$ , HDL > 40 mg.             Core Components/Risk Factors/Patient Goals Review:    Core Components/Risk Factors/Patient Goals at Discharge (Final Review):    ITP Comments:  ITP Comments     Row Name 01/06/22 [id number]/08/23 [phone]  ITP Comments Medical Director- Dr. Fransico Him, MD Dr. Fransico Him Medical Director: Introduction to Pritikin Education program/ Intensive Cardiac Rehab, initial Pritikin orientation pack reviewed               Comments: Participant attended orientation for the cardiac rehabilitation program on  01/06/2022  to perform initial intake and exercise walk test, patient reported 4/10 left leg pain and mild SOB, resolved with rest. Patient introduced to the [place] education and orientation packet was reviewed. Completed 6-minute walk test, measurements, initial ITP, and exercise prescription. SBP elevations noted, exit B/P 144/70 will continue to monitor B/P,Telemetry-normal sinus rhythm with occasional PVC,   Service time was from 0834 to 1020.     Albertine Grates RN 8/8/202311:11 AM

## 2022-01-07 ENCOUNTER — Emergency Department (HOSPITAL_BASED_OUTPATIENT_CLINIC_OR_DEPARTMENT_OTHER)
Admission: EM | Admit: 2022-01-07 | Discharge: 2022-01-07 | Disposition: A | Discharge status: Home / Self Care | Payer: Medicare Other | Attending: Emergency Medicine | Admitting: Emergency Medicine

## 2022-01-07 ENCOUNTER — Encounter (HOSPITAL_BASED_OUTPATIENT_CLINIC_OR_DEPARTMENT_OTHER): Payer: Self-pay

## 2022-01-07 DIAGNOSIS — M5416 Radiculopathy, lumbar region: Secondary | ICD-10-CM | POA: Diagnosis not present

## 2022-01-07 DIAGNOSIS — T162XXA Foreign body in left ear, initial encounter: Secondary | ICD-10-CM

## 2022-01-07 NOTE — Discharge Instructions (Signed)
Return to the ER for any new and/or concerning symptoms.

## 2022-01-07 NOTE — ED Triage Notes (Signed)
Pt has bug in left ear.  States he is dizzy and bug is moving

## 2022-01-07 NOTE — ED Provider Notes (Signed)
Daniel Whitaker Provider Note   CSN: [id number] Arrival date & time: 01/06/22  2353     History  Chief Complaint  Patient presents with   bug in ear    Daniel Whitaker is a 76 y.o. male.  Patient is a 76 year old male presenting with a bug in his left ear.  He said he went out onto his patio when a moth flew into his ear.  He denies any other complaint.  He can feel the bug fluttering.  The history is provided by the patient.       Home Medications Prior to Admission medications   Medication Sig Start Date End Date Taking? Authorizing Provider  aspirin EC 81 MG tablet Take 1 tablet (81 mg total) by mouth daily. Swallow whole. 10/22/21   Leonie Man, MD  brimonidine (ALPHAGAN) 0.2 % ophthalmic solution Place 1 drop into both eyes in the morning and at bedtime. 01/08/20   [provider]  carvedilol (COREG) 3.125 MG tablet Take 1 tablet (3.125 mg total) by mouth 2 (two) times daily. 12/19/21   Vickie Epley, MD  cetirizine (ZYRTEC) 10 MG tablet Take 10 mg by mouth every morning.    [provider]  Cholecalciferol (VITAMIN D) 2000 UNITS CAPS Take 2,000 Units by mouth daily.    [provider]  clopidogrel (PLAVIX) 75 MG tablet Take 1 tablet (75 mg total) by mouth daily. 11/21/21   Minus Breeding, MD  Cyanocobalamin (VITAMIN B-12) 5000 MCG TBDP Take 10,000 mcg by mouth every morning.    [provider]  dorzolamide-timolol (COSOPT) 22.3-6.8 MG/ML ophthalmic solution Place 1 drop into the left eye 2 (two) times daily. 03/09/18   [provider]  ketoconazole (NIZORAL) 2 % cream Apply 1 application  topically daily as needed for irritation. 11/28/14   [provider]  latanoprost (XALATAN) 0.005 % ophthalmic solution Place 1 drop into both eyes at bedtime. 03/01/13   [provider]  lidocaine (XYLOCAINE) 5 % ointment Apply 1 application topically 2 (two) times daily as needed. Patient taking  differently: Apply 1 application  topically 2 (two) times daily as needed (neuropathy). 05/12/21   Ward Givens, NP  MAGNESIUM PO Take 400 mg by mouth daily.    [provider]  Multiple Vitamins-Minerals (CENTRUM) tablet Take 1 tablet by mouth daily. 50 +    [provider]  nitroGLYCERIN (NITROSTAT) 0.4 MG SL tablet Place 1 tablet (0.4 mg total) under the tongue every 5 (five) minutes as needed. 10/22/21   Cheryln Manly, NP  Omega-3 Fatty Acids (FISH OIL) 1200 MG CAPS Take 3,600 mg by mouth daily. 3    [provider]  pantoprazole (PROTONIX) 40 MG tablet Take 40 mg by mouth daily.  03/14/13   [provider]  pregabalin (LYRICA) 200 MG capsule Take 1 capsule (200 mg total) by mouth 2 (two) times daily. 09/01/21   Ward Givens, NP  rOPINIRole (REQUIP) 2 MG tablet Take 1 tablet at 5 Pm and 1/2 tablet at bedtime 09/29/21   Ward Givens, NP  rosuvastatin (CRESTOR) 40 MG tablet Take 1 tablet (40 mg total) by mouth daily. 10/22/21 04/20/22  Reino Bellis B, NP  sildenafil (REVATIO) 20 MG tablet Take 20-100 mg by mouth daily as needed (ED).    [provider]      Allergies    Cymbalta [duloxetine hcl], Lipitor [atorvastatin], and Penicillins    Review of Systems   Review of Systems  All  other systems reviewed and are negative.   Physical Exam Updated Vital Signs BP (!) 140/86 (BP Location: Right Arm)   Pulse 76   Temp 97.9 F (36.6 C) (Oral)   Resp 14   Ht '6\' 1"'$  (1.854 m)   Wt 111.1 kg   SpO2 98%   BMI 32.32 kg/m  Physical Exam Vitals and nursing note reviewed.  Constitutional:      Appearance: Normal appearance.  HENT:     Head: Normocephalic and atraumatic.     Ears:     Comments: The left ear canal is unremarkable.  TM is clear.  The bug was flushed out with irrigation. Skin:    General: Skin is warm.  Neurological:     Mental Status: He is alert.     ED Results / Procedures / Treatments   Labs (all labs ordered  are listed, but only abnormal results are displayed) Labs Reviewed - No data to display  EKG None  Radiology No results found.  Procedures Procedures    Medications Ordered in ED Medications - No data to display  ED Course/ Medical Decision Making/ A&P  Patient presenting with complaints of a bug in his left ear.  This was irrigated out by nursing staff.  Ear rechecked and no further foreign body.  TM is intact and ear canal is normal in appearance.  Patient to be discharged with as needed return.  Final Clinical Impression(s) / ED Diagnoses Final diagnoses:  None    Rx / DC Orders ED Discharge Orders     None         Veryl Speak, MD 01/07/22 [id number]

## 2022-01-12 ENCOUNTER — Encounter (HOSPITAL_COMMUNITY)
Admission: RE | Admit: 2022-01-12 | Discharge: 2022-01-12 | Disposition: A | Discharge status: Home / Self Care | Payer: Medicare Other | Source: Ambulatory Visit | Attending: Cardiology | Admitting: Cardiology

## 2022-01-12 DIAGNOSIS — Z955 Presence of coronary angioplasty implant and graft: Secondary | ICD-10-CM

## 2022-01-12 DIAGNOSIS — Z48812 Encounter for surgical aftercare following surgery on the circulatory system: Secondary | ICD-10-CM | POA: Diagnosis not present

## 2022-01-12 NOTE — Progress Notes (Signed)
Daily Session Note  Patient Details  Name: Daniel Whitaker MRN: [id number] Date of Birth: 03/18/1946 Referring Provider:   Flowsheet Row INTENSIVE CARDIAC REHAB ORIENT from 01/06/2022 in [place]  Referring Provider Minus Breeding, MD       Encounter Date: 01/12/2022  Check In:  Session Check In - 01/12/22 0659       Check-In   Supervising physician immediately available to respond to emergencies [facility] immediately available    Physician(s) Dr. Posey Pronto    Location [facility]    Staff Present Albertine Grates, Cathleen Fears, MS, ACSM-CEP, Exercise Physiologist;Olinty Celesta Aver, MS, ACSM-CEP, Exercise Physiologist;Jetta Gilford Rile BS, ACSM-CEP, Exercise Physiologist;Carlette Wilber Oliphant, RN, BSN    Virtual Visit No    Medication changes reported     No    Fall or balance concerns reported    No    Tobacco Cessation No Change    Warm-up and Cool-down Performed as group-led instruction    Resistance Training Performed Yes    VAD Patient? No    PAD/SET Patient? No      Pain Assessment   Currently in Pain? Yes    Pain Score 4     Pain Location Knee    Pain Orientation Left    Pain Descriptors / Indicators Aching    Pain Type Chronic pain    Pain Onset More than a month ago    Pain Frequency Intermittent    Multiple Pain Sites No             Capillary Blood Glucose: No results found for this or any previous visit (from the past 24 hour(s)).   Exercise Prescription Changes - 01/12/22 0837       Response to Exercise   Blood Pressure (Admit) 128/64    Blood Pressure (Exercise) 140/64    Blood Pressure (Exit) 114/68    Heart Rate (Admit) 66 bpm    Heart Rate (Exercise) 82 bpm    Heart Rate (Exit) 63 bpm    Rating of Perceived Exertion (Exercise) 10.5    Perceived Dyspnea (Exercise) 0    Symptoms 4/10 chronic L knee pain    Comments First day in CRP2 program    Duration Progress to 30 minutes of  aerobic without  signs/symptoms of physical distress    Intensity THRR unchanged      Progression   Progression Continue to progress workloads to maintain intensity without signs/symptoms of physical distress.    Average METs 1.7      Resistance Training   Training Prescription Yes    Weight 3 lbs    Reps 10-15    Time 10 Minutes      Recumbant Bike   Level 1.5    Minutes 15    METs 1.6      NuStep   Level 2    SPM 85    Minutes 15    METs 1.8             Social History   Tobacco Use  Smoking Status Former   Types: Cigarettes   Quit date: 06/01/1984   Years since quitting: 37.6  Smokeless Tobacco Never    Goals Met:  Exercise tolerated well No report of concerns or symptoms today Strength training completed today  Goals Unmet:  Not Applicable  Comments: Pt started Intensive cardiac rehab today. Pt tolerated light exercise without difficulty.VSS, telemetry-NSR, pt c/o 4/10 chronic left knee pain. Medication list reconciled. Pt denies  barriers to medication compliance.  PSYCHOSOCIAL ASSESSMENT:  PHQ-0. Pt exhibits positive coping skills, hopeful outlook with supportive family. No psychosocial needs identified at this time, no psychosocial interventions necessary. Pt enjoys playing golf. Pt oriented to exercise equipment and routine. Understanding verbalized. Albertine Grates RN 01/12/2022 2:49 PM   Dr. Fransico Him is Medical Director for Cardiac Rehab at [facility].

## 2022-01-12 NOTE — Progress Notes (Addendum)
QUALITY OF LIFE SCORE REVIEW Pt completed Quality of Life survey as a participant in Cardiac Rehab.  Scores 19.0 or below are considered low. Overall 27, Health and Function 24.4, socioeconomic 28.29, physiological and spiritual 30, family 28.8. Patient quality of life slightly altered by physical constraints which limits ability to perform as prior to recent cardiac illness.  Offered emotional support and reassurance. Will continue to monitor and intervene as necessary.   Albertine Grates RN 01/12/2022 7:52 AM

## 2022-01-14 ENCOUNTER — Encounter (HOSPITAL_COMMUNITY)
Admission: RE | Admit: 2022-01-14 | Discharge: 2022-01-14 | Disposition: A | Discharge status: Home / Self Care | Payer: Medicare Other | Source: Ambulatory Visit | Attending: Cardiology | Admitting: Cardiology

## 2022-01-14 ENCOUNTER — Telehealth: Payer: Self-pay

## 2022-01-14 DIAGNOSIS — Z955 Presence of coronary angioplasty implant and graft: Secondary | ICD-10-CM | POA: Diagnosis not present

## 2022-01-14 DIAGNOSIS — Z48812 Encounter for surgical aftercare following surgery on the circulatory system: Secondary | ICD-10-CM | POA: Diagnosis not present

## 2022-01-14 NOTE — Telephone Encounter (Signed)
   Patient Name: Daniel Whitaker  DOB: May 22, 1946 MRN: [id number]  Primary Cardiologist: Minus Breeding, MD  Chart reviewed as part of pre-operative protocol coverage.   Patient is post DES-LAD on 10/22/2021 on DAPT with ASA and Plavix. Patient must continue DAPT with ASA and Plavix without interruption for a minimum of 6 months (1 year recommended). Patient would not be eligible to come off DAPT until the end of November 2023.  Recommend patient keep scheduled appointment with Dr. Percival Spanish on 05/01/2022 to discuss possible interruption in DAPT for planned procedure at that time.  Preoperative coverage team, please notify patient and requesting party.  I will remove this request from preop pool.  Lenna Sciara, NP 01/14/2022, 5:05 PM

## 2022-01-14 NOTE — Telephone Encounter (Signed)
   Pre-operative Risk Assessment    Patient Name: Daniel Whitaker  DOB: Jun 04, 1945 MRN: [id number]      Request for Surgical Clearance    Procedure:   Spinal Injection Bilateral L4-5 Transforaminal  Date of Surgery:  Clearance TBD                                 Surgeon:  Dr. Marlaine Hind Surgeon's Group or Practice Name:  [facility] Phone number:  [phone] Fax number:  [phone] [name]   Type of Clearance Requested:   - Medical    Type of Anesthesia:  Not Indicated   Additional requests/questions:   Patient should not take Plavix, Aspirin, or Fish Oil for 7 days prior to injection.  Signed, Elsie Lincoln [name]   01/14/2022, 3:10 PM

## 2022-01-15 NOTE — Telephone Encounter (Signed)
Call placed to pt regarding surgical clearance / recommendations below. He will keep appt 05/01/22 and discuss at that time.

## 2022-01-15 NOTE — Telephone Encounter (Signed)
Routed information below, to requesting surgeon's office.

## 2022-01-16 ENCOUNTER — Encounter (HOSPITAL_COMMUNITY)
Admission: RE | Admit: 2022-01-16 | Discharge: 2022-01-16 | Disposition: A | Discharge status: Home / Self Care | Payer: Medicare Other | Source: Ambulatory Visit | Attending: Cardiology | Admitting: Cardiology

## 2022-01-16 DIAGNOSIS — Z955 Presence of coronary angioplasty implant and graft: Secondary | ICD-10-CM

## 2022-01-16 DIAGNOSIS — Z48812 Encounter for surgical aftercare following surgery on the circulatory system: Secondary | ICD-10-CM | POA: Diagnosis not present

## 2022-01-19 ENCOUNTER — Ambulatory Visit: Payer: Medicare Other | Admitting: Neurology

## 2022-01-19 ENCOUNTER — Encounter: Payer: Self-pay | Admitting: Neurology

## 2022-01-19 ENCOUNTER — Encounter (HOSPITAL_COMMUNITY)
Admission: RE | Admit: 2022-01-19 | Discharge: 2022-01-19 | Disposition: A | Discharge status: Home / Self Care | Payer: Medicare Other | Source: Ambulatory Visit | Attending: Cardiology | Admitting: Cardiology

## 2022-01-19 VITALS — BP 131/70 | HR 60 | Ht 73.0 in | Wt 254.0 lb

## 2022-01-19 DIAGNOSIS — D472 Monoclonal gammopathy: Secondary | ICD-10-CM | POA: Diagnosis not present

## 2022-01-19 DIAGNOSIS — Z955 Presence of coronary angioplasty implant and graft: Secondary | ICD-10-CM | POA: Diagnosis not present

## 2022-01-19 DIAGNOSIS — R269 Unspecified abnormalities of gait and mobility: Secondary | ICD-10-CM

## 2022-01-19 DIAGNOSIS — G2581 Restless legs syndrome: Secondary | ICD-10-CM

## 2022-01-19 DIAGNOSIS — Z48812 Encounter for surgical aftercare following surgery on the circulatory system: Secondary | ICD-10-CM | POA: Diagnosis not present

## 2022-01-19 DIAGNOSIS — G4733 Obstructive sleep apnea (adult) (pediatric): Secondary | ICD-10-CM | POA: Diagnosis not present

## 2022-01-19 MED ORDER — PREGABALIN 100 MG PO CAPS: ORAL_CAPSULE | ORAL | 5 refills | Status: DC

## 2022-01-19 NOTE — Progress Notes (Addendum)
Chief Complaint  Patient presents with   Follow-up    Rm 14. Alone. States neuropathy pain is traveling up both legs. Discuss Botox.      ASSESSMENT AND PLAN  Daniel Whitaker is a 76 y.o. male  Slow progressive worsening distal leg paresthesia, gait abnormality Monoclonal gammopathy of unknown clinical significance, IgA, low titer 0.4  On examinations, he has length-dependent sensory changes, areflexia, mild toe flexion extension weakness, positive Romberg signs,  Above findings suggestive of large fiber involvement  EMG nerve conduction study for evaluation of possible demyelinating features  Restless leg syndrome  Change Lyrica to 100 mg tablets, heavy dose at nighttime, only take as needed during the day  Limit Requip  up to 2 mg every night only take it before bedtime to avoid augmentation At risk for obstructive sleep apnea  Loud snoring, obesity, excessive daytime sleepiness and fatigue  Refer to sleep study  Excessive drooling  Suggested hard sugarless candy to stimulate swallowing reflex, before consider Botox injection  DIAGNOSTIC DATA (LABS, IMAGING, TESTING) - I reviewed patient records, labs, notes, testing and imaging myself where available.   MEDICAL HISTORY:  Daniel Whitaker is a 76 year old male, follow-up for restless leg syndrome, peripheral neuropathy, excessive drooling, his primary care physician is Dr. [name], he was seen by Dr. Jannifer Franklin and Jinny Blossom in the past  I reviewed and summarized the referring note. PMHX. HTN CAD, stent in May, on Asa+Plavix HLD MGUS Small bowel obstruction, s/p Colon Resection   Patient was seen by our clinic since 2014 for bilateral lower extremity paresthesia, numbness was at bottom of his feet and toes, over the years, only slight progression of the numbness, he denies bilateral fingertips paresthesia  But over the past few years he noticed gradual onset unsteady gait, he still plays golf about once a week,  but use golf cart, instead of walking, he complains of difficulty shampooing his hair, with his eyes closed, he felt more unsteady  Since 2023, he also noticed intermittent sharp radiating discomfort from bilateral knee down, he denies bowel or bladder incontinence, denies significant low back pain  He apparently has relative sedentary lifestyle, obesity,  frequent awakening at nighttime, excessive daytime sleepiness, fatigue, never had sleep study before, chronic artery disease, status post stent placement in May 2023, on aspirin and Plavix  He has irregular sleep pattern, tends to nod off during the day watching TV, at the evening time, he complains of bilateral lower extremity discomfort, was on Requip 2 mg daily, now with higher dose 1 tablet at 7 PM, half tablet before he goes to bed, which seems to help him some, in addition he is taking Lyrica titrating dose up to 200 mg twice a day now, he was not sure he is symptomatic during the day, but he still take it twice a day, he can falling to sleep fairly quickly with the help of medication  Laboratory evaluation over the years has showed M protein, titer was 0.4 in July 2022, IgA monoclonal protein with kappa light chain specificity  Over the past few months he also noticed excessive drooling, especially at nighttime, he has to wipe his mouth, frequently, wonder if Botox is a good solution, but he has not tried other conservative methods   PHYSICAL EXAM:   Vitals:   01/19/22 0907  BP: 131/70  Pulse: 60  Weight: 254 lb (115.2 kg)  Height: '6\' 1"'$  (1.854 m)   Not recorded     Body mass  index is 33.51 kg/m.  PHYSICAL EXAMNIATION:  Gen: NAD, conversant, well nourised, well groomed                     Cardiovascular: Regular rate rhythm, no peripheral edema, warm, nontender. Eyes: Conjunctivae clear without exudates or hemorrhage Neck: Supple, no carotid bruits. Pulmonary: Clear to auscultation bilaterally   NEUROLOGICAL  EXAM:  MENTAL STATUS: Speech/cognition: Tired looking elderly male, decreased facial expression, oriented to history taking care of conversation CRANIAL NERVES: CN II: Visual fields are full to confrontation. Pupils are round equal and briskly reactive to light. CN III, IV, VI: extraocular movement are normal. No ptosis. CN V: Facial sensation is intact to light touch CN VII: Face is symmetric with normal eye closure  CN VIII: Hearing is normal to causal conversation. CN IX, X: Phonation is normal. CN XI: Head turning and shoulder shrug are intact CN XII: Narrow oropharyngeal space  MOTOR: Bilateral lower extremity pitting edema, mild toe extension, flexion weakness, proximal muscle strength was normal  REFLEXES: areflexia  SENSORY: Length Dependent decreased light touch, pin prick, vibratory sensation to below knee level, preserved toe proprioception  COORDINATION: There is no trunk or limb dysmetria noted.  GAIT/STANCE: Need push-up to get up from seated position, mildly unsteady, difficulty standing up on tiptoes and heels, positive Romberg signs,  REVIEW OF SYSTEMS:  Full 14 system review of systems performed and notable only for as above All other review of systems were negative.   ALLERGIES: Allergies  Allergen Reactions   Cymbalta [Duloxetine Hcl]     Drowsiness, nausea   Lipitor [Atorvastatin] Other (See Comments)    Pt unsure of reaction    Penicillins Hives    HOME MEDICATIONS: Current Outpatient Medications  Medication Sig Dispense Refill   aspirin EC 81 MG tablet Take 1 tablet (81 mg total) by mouth daily. Swallow whole. 30 tablet 0   brimonidine (ALPHAGAN) 0.2 % ophthalmic solution Place 1 drop into both eyes in the morning and at bedtime.     carvedilol (COREG) 3.125 MG tablet Take 1 tablet (3.125 mg total) by mouth 2 (two) times daily. 60 tablet 4   cetirizine (ZYRTEC) 10 MG tablet Take 10 mg by mouth every morning.     Cholecalciferol (VITAMIN D) 2000  UNITS CAPS Take 2,000 Units by mouth daily.     clopidogrel (PLAVIX) 75 MG tablet Take 1 tablet (75 mg total) by mouth daily. 90 tablet 1   Cyanocobalamin (VITAMIN B-12) 5000 MCG TBDP Take 10,000 mcg by mouth every morning.     dorzolamide-timolol (COSOPT) 22.3-6.8 MG/ML ophthalmic solution Place 1 drop into the left eye 2 (two) times daily.     ketoconazole (NIZORAL) 2 % cream Apply 1 application  topically daily as needed for irritation.     latanoprost (XALATAN) 0.005 % ophthalmic solution Place 1 drop into both eyes at bedtime.     lidocaine (XYLOCAINE) 5 % ointment Apply 1 application topically 2 (two) times daily as needed. (Patient taking differently: Apply 1 application  topically 2 (two) times daily as needed (neuropathy).) 35.44 g 3   MAGNESIUM PO Take 400 mg by mouth daily.     Multiple Vitamins-Minerals (CENTRUM) tablet Take 1 tablet by mouth daily. 50 +     nitroGLYCERIN (NITROSTAT) 0.4 MG SL tablet Place 1 tablet (0.4 mg total) under the tongue every 5 (five) minutes as needed. 25 tablet 2   Omega-3 Fatty Acids (FISH OIL) 1200 MG CAPS Take 3,600 mg by  mouth daily. 3     pantoprazole (PROTONIX) 40 MG tablet Take 40 mg by mouth daily.      pregabalin (LYRICA) 200 MG capsule Take 1 capsule (200 mg total) by mouth 2 (two) times daily. 180 capsule 1   rOPINIRole (REQUIP) 2 MG tablet Take 1 tablet at 5 Pm and 1/2 tablet at bedtime 135 tablet 3   rosuvastatin (CRESTOR) 40 MG tablet Take 1 tablet (40 mg total) by mouth daily. 90 tablet 1   sildenafil (REVATIO) 20 MG tablet Take 20-100 mg by mouth daily as needed (ED).     No current facility-administered medications for this visit.    PAST MEDICAL HISTORY: Past Medical History:  Diagnosis Date   Arthritis    Cataract    removed bilaterally    Coronary artery disease    Erectile dysfunction    Esophageal reflux    Glaucoma    Hypertriglyceridemia    Impaired fasting glucose    MGUS (monoclonal gammopathy of unknown significance)     Neuropathy associated with MGUS ([place]) 07/11/2013   Nocturnal leg cramps 03/11/2021   Nontoxic uninodular goiter    Obesity    Peripheral neuropathy    RLS (restless legs syndrome)    Spinal stenosis    Unspecified deficiency anemia 07/11/2013    PAST SURGICAL HISTORY: Past Surgical History:  Procedure Laterality Date   CARDIAC CATHETERIZATION     COLON RESECTION N/A 04/21/2013   Procedure: DIAGNOSTIC LAPAROSCOPY, lysis of adhesions for partial small bowel obstruction;  Surgeon: Odis Hollingshead, MD;  Location: [facility];  Service: General;  Laterality: N/A;   COLON SURGERY     partial SBO    COLONOSCOPY     CORONARY STENT INTERVENTION N/A 10/22/2021   Procedure: CORONARY STENT INTERVENTION;  Surgeon: Leonie Man, MD;  Location: [facility];  Service: Cardiovascular;  Laterality: N/A;   KNEE SURGERY     Arthroscopic   LEFT HEART CATH AND CORONARY ANGIOGRAPHY N/A 10/22/2021   Procedure: LEFT HEART CATH AND CORONARY ANGIOGRAPHY;  Surgeon: Leonie Man, MD;  Location: [place] [facility];  Service: Cardiovascular;  Laterality: N/A;   SMALL INTESTINE SURGERY     Blockage    FAMILY HISTORY: Family History  Problem Relation Age of Onset   Brain cancer Mother    Lung cancer Father    Heart disease Brother        No details   Dementia Brother    Neuropathy Neg Hx    Colon cancer Neg Hx    Colon polyps Neg Hx    Esophageal cancer Neg Hx    Rectal cancer Neg Hx    Stomach cancer Neg Hx     SOCIAL HISTORY: Social History   Socioeconomic History   Marital status: Married    Spouse name: [name]   Number of children: 2   Years of education: GED   Highest education level: Not on file  Occupational History   Occupation: still working  Tobacco Use   Smoking status: Former    Types: Cigarettes    Quit date: 06/01/1984    Years since quitting: 37.6   Smokeless tobacco: Never  Vaping Use   Vaping Use: Never used  Substance and Sexual Activity   Alcohol use:  Yes    Alcohol/week: 17.0 standard drinks of alcohol    Types: 7 Glasses of wine, 10 Cans of beer per week   Drug use: No   Sexual activity: Not on file  Other Topics Concern   Not on file  Social History Narrative   Patient lives at home with his wife [name])   Retired.    Patient has two children.   Patient is right-handed.   Patient has his GED.   Patient does not drink caffeine.   Social Determinants of Health   Financial Resource Strain: Not on file  Food Insecurity: Not on file  Transportation Needs: Not on file  Physical Activity: Not on file  Stress: Not on file  Social Connections: Not on file  Intimate Partner Violence: Not on file      Marcial Pacas, M.D. Ph.D.  [facility] [address], [place], [place] [address] Ph: [phone] Fax: [phone]  CC:  [name], MD [address],  [place] [address]  [name], MD

## 2022-01-21 ENCOUNTER — Encounter (HOSPITAL_COMMUNITY)
Admission: RE | Admit: 2022-01-21 | Discharge: 2022-01-21 | Disposition: A | Discharge status: Home / Self Care | Payer: Medicare Other | Source: Ambulatory Visit | Attending: Cardiology | Admitting: Cardiology

## 2022-01-21 ENCOUNTER — Telehealth: Payer: Self-pay | Admitting: Cardiology

## 2022-01-21 DIAGNOSIS — Z955 Presence of coronary angioplasty implant and graft: Secondary | ICD-10-CM | POA: Diagnosis not present

## 2022-01-21 DIAGNOSIS — Z48812 Encounter for surgical aftercare following surgery on the circulatory system: Secondary | ICD-10-CM | POA: Diagnosis not present

## 2022-01-21 NOTE — Progress Notes (Signed)
Cardiac Individual Treatment Plan  Patient Details  Name: Daniel Whitaker MRN: [id number] Date of Birth: [birth year] Referring Provider:   Flowsheet Row INTENSIVE CARDIAC REHAB ORIENT from 01/06/2022 in [place]  Referring Provider Minus Breeding, MD       Initial Encounter Date:  [place] from 01/06/2022 in [place]  Date 01/06/22       Visit Diagnosis: 10/22/21 DES LAD  Patient's Home Medications on Admission:  Current Outpatient Medications:    aspirin EC 81 MG tablet, Take 1 tablet (81 mg total) by mouth daily. Swallow whole., Disp: 30 tablet, Rfl: 0   brimonidine (ALPHAGAN) 0.2 % ophthalmic solution, Place 1 drop into both eyes in the morning and at bedtime., Disp: , Rfl:    carvedilol (COREG) 3.125 MG tablet, Take 1 tablet (3.125 mg total) by mouth 2 (two) times daily., Disp: 60 tablet, Rfl: 4   cetirizine (ZYRTEC) 10 MG tablet, Take 10 mg by mouth every morning., Disp: , Rfl:    Cholecalciferol (VITAMIN D) 2000 UNITS CAPS, Take 2,000 Units by mouth daily., Disp: , Rfl:    clopidogrel (PLAVIX) 75 MG tablet, Take 1 tablet (75 mg total) by mouth daily., Disp: 90 tablet, Rfl: 1   Cyanocobalamin (VITAMIN B-12) 5000 MCG TBDP, Take 10,000 mcg by mouth every morning., Disp: , Rfl:    dorzolamide-timolol (COSOPT) 22.3-6.8 MG/ML ophthalmic solution, Place 1 drop into the left eye 2 (two) times daily., Disp: , Rfl:    ketoconazole (NIZORAL) 2 % cream, Apply 1 application  topically daily as needed for irritation., Disp: , Rfl:    latanoprost (XALATAN) 0.005 % ophthalmic solution, Place 1 drop into both eyes at bedtime., Disp: , Rfl:    lidocaine (XYLOCAINE) 5 % ointment, Apply 1 application topically 2 (two) times daily as needed. (Patient taking differently: Apply 1 application  topically 2 (two) times daily as needed (neuropathy).), Disp: 35.44 g, Rfl: 3   MAGNESIUM PO, Take 400 mg by  mouth daily., Disp: , Rfl:    Multiple Vitamins-Minerals (CENTRUM) tablet, Take 1 tablet by mouth daily. 50 +, Disp: , Rfl:    nitroGLYCERIN (NITROSTAT) 0.4 MG SL tablet, Place 1 tablet (0.4 mg total) under the tongue every 5 (five) minutes as needed., Disp: 25 tablet, Rfl: 2   Omega-3 Fatty Acids (FISH OIL) 1200 MG CAPS, Take 3,600 mg by mouth daily. 3, Disp: , Rfl:    pantoprazole (PROTONIX) 40 MG tablet, Take 40 mg by mouth daily. , Disp: , Rfl:    pregabalin (LYRICA) 100 MG capsule, One in the am, one at noon and 2 tabs at night, Disp: 120 capsule, Rfl: 5   rOPINIRole (REQUIP) 2 MG tablet, Take 1 tablet at 5 Pm and 1/2 tablet at bedtime, Disp: 135 tablet, Rfl: 3   rosuvastatin (CRESTOR) 40 MG tablet, Take 1 tablet (40 mg total) by mouth daily., Disp: 90 tablet, Rfl: 1   sildenafil (REVATIO) 20 MG tablet, Take 20-100 mg by mouth daily as needed (ED)., Disp: , Rfl:   Past Medical History: Past Medical History:  Diagnosis Date   Arthritis    Cataract    removed bilaterally    Coronary artery disease    Erectile dysfunction    Esophageal reflux    Glaucoma    Hypertriglyceridemia    Impaired fasting glucose    MGUS (monoclonal gammopathy of unknown significance)    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  Nocturnal leg cramps 03/11/2021   Nontoxic uninodular goiter    Obesity    Peripheral neuropathy    RLS (restless legs syndrome)    Spinal stenosis    Unspecified deficiency anemia 07/11/2013    Tobacco Use: Social History   Tobacco Use  Smoking Status Former   Types: Cigarettes   Quit date: 06/01/1984   Years since quitting: 37.6  Smokeless Tobacco Never    Labs: Review Flowsheet       Latest Ref Rng & Units 04/15/2013  Labs for ITP Cardiac and Pulmonary Rehab  Hemoglobin A1c <5.7 % 5.2     Capillary Blood Glucose: Lab Results  Component Value Date   GLUCAP 112 (H) 04/18/2013   GLUCAP 102 (H) 04/18/2013   GLUCAP 111 (H) 04/17/2013   GLUCAP 119 (H)  04/17/2013   GLUCAP 106 (H) 04/17/2013     Exercise Target Goals: Exercise Program Goal: Individual exercise prescription set using results from initial 6 min walk test and THRR while considering  patient's activity barriers and safety.   Exercise Prescription Goal: Initial exercise prescription builds to 30-45 minutes a day of aerobic activity, 2-3 days per week.  Home exercise guidelines will be given to patient during program as part of exercise prescription that the participant will acknowledge.  Activity Barriers & Risk Stratification:  Activity Barriers & Cardiac Risk Stratification - 01/06/22 0903       Activity Barriers & Cardiac Risk Stratification   Activity Barriers Arthritis;Back Problems;History of Falls;Balance Concerns;Other (comment)    Comments Neuropathy in both feet, "bad" left knee, gets injections; physical therapy for recent left hand injury; hx crushed vertebrae, back pain, gets injections.    Cardiac Risk Stratification Low             6 Minute Walk:  6 Minute Walk     Row Name 01/06/22 0914         6 Minute Walk   Phase Initial     Distance 1766 feet     Walk Time 6 minutes     # of Rest Breaks 0     MPH 3.34     METS 3.4     RPE 12     Perceived Dyspnea  1     VO2 Peak 11.91     Symptoms Yes (comment)     Comments Patient c/o lower, left leg pain, 4/10 on the pain scale; mild SOB, RPD=1.     Resting HR 71 bpm     Resting BP 133/70     Resting Oxygen Saturation  98 %     Exercise Oxygen Saturation  during 6 min walk 95 %     Max Ex. HR 104 bpm     Max Ex. BP 178/60     2 Minute Post BP 166/80              Oxygen Initial Assessment:   Oxygen Re-Evaluation:   Oxygen Discharge (Final Oxygen Re-Evaluation):   Initial Exercise Prescription:  Initial Exercise Prescription - 01/06/22 0900       Date of Initial Exercise RX and Referring Provider   Date 01/06/22    Referring Provider Minus Breeding, MD    Expected Discharge  Date 03/13/22      Recumbant Bike   Level 1.5    Minutes 15    METs 2      NuStep   Level 2    SPM 85    Minutes 15  METs 2      Prescription Details   Frequency (times per week) 3    Duration Progress to 30 minutes of continuous aerobic without signs/symptoms of physical distress      Intensity   THRR 40-80% of Max Heartrate 58-116    Ratings of Perceived Exertion 11-13    Perceived Dyspnea 0-4      Progression   Progression Continue to progress workloads to maintain intensity without signs/symptoms of physical distress.      Resistance Training   Training Prescription Yes    Weight 3 lbs    Reps 10-15             Perform Capillary Blood Glucose checks as needed.  Exercise Prescription Changes:   Exercise Prescription Changes     Row Name 01/12/22 0837             Response to Exercise   Blood Pressure (Admit) 128/64       Blood Pressure (Exercise) 140/64       Blood Pressure (Exit) 114/68       Heart Rate (Admit) 66 bpm       Heart Rate (Exercise) 82 bpm       Heart Rate (Exit) 63 bpm       Rating of Perceived Exertion (Exercise) 10.5       Perceived Dyspnea (Exercise) 0       Symptoms 4/10 chronic L knee pain       Comments First day in CRP2 program       Duration Progress to 30 minutes of  aerobic without signs/symptoms of physical distress       Intensity THRR unchanged         Progression   Progression Continue to progress workloads to maintain intensity without signs/symptoms of physical distress.       Average METs 1.7         Resistance Training   Training Prescription Yes       Weight 3 lbs       Reps 10-15       Time 10 Minutes         Recumbant Bike   Level 1.5       Minutes 15       METs 1.6         NuStep   Level 2       SPM 85       Minutes 15       METs 1.8                Exercise Comments:   Exercise Goals and Review:   Exercise Goals     Row Name 01/06/22 0846             Exercise Goals    Increase Physical Activity Yes       Intervention Provide advice, education, support and counseling about physical activity/exercise needs.;Develop an individualized exercise prescription for aerobic and resistive training based on initial evaluation findings, risk stratification, comorbidities and participant's personal goals.       Expected Outcomes Short Term: Attend rehab on a regular basis to increase amount of physical activity.;Long Term: Exercising regularly at least 3-5 days a week.;Long Term: Add in home exercise to make exercise part of routine and to increase amount of physical activity.       Increase Strength and Stamina Yes       Intervention Provide advice, education, support and counseling about physical activity/exercise needs.;Develop  an individualized exercise prescription for aerobic and resistive training based on initial evaluation findings, risk stratification, comorbidities and participant's personal goals.       Expected Outcomes Short Term: Increase workloads from initial exercise prescription for resistance, speed, and METs.;Short Term: Perform resistance training exercises routinely during rehab and add in resistance training at home;Long Term: Improve cardiorespiratory fitness, muscular endurance and strength as measured by increased METs and functional capacity (6MWT)       Able to understand and use rate of perceived exertion (RPE) scale Yes       Intervention Provide education and explanation on how to use RPE scale       Expected Outcomes Short Term: Able to use RPE daily in rehab to express subjective intensity level;Long Term:  Able to use RPE to guide intensity level when exercising independently       Knowledge and understanding of Target Heart Rate Range (THRR) Yes       Intervention Provide education and explanation of THRR including how the numbers were predicted and where they are located for reference       Expected Outcomes Short Term: Able to state/look up  THRR;Long Term: Able to use THRR to govern intensity when exercising independently;Short Term: Able to use daily as guideline for intensity in rehab       Able to check pulse independently Yes       Intervention Provide education and demonstration on how to check pulse in carotid and radial arteries.;Review the importance of being able to check your own pulse for safety during independent exercise       Expected Outcomes Short Term: Able to explain why pulse checking is important during independent exercise;Long Term: Able to check pulse independently and accurately       Understanding of Exercise Prescription Yes       Intervention Provide education, explanation, and written materials on patient's individual exercise prescription       Expected Outcomes Short Term: Able to explain program exercise prescription;Long Term: Able to explain home exercise prescription to exercise independently                Exercise Goals Re-Evaluation :  Exercise Goals Re-Evaluation     Row Name 01/12/22 0850             Exercise Goal Re-Evaluation   Exercise Goals Review Increase Physical Activity;Increase Strength and Stamina;Able to understand and use rate of perceived exertion (RPE) scale;Knowledge and understanding of Target Heart Rate Range (THRR);Understanding of Exercise Prescription       Comments Pt first day in theCRP2 program. Pt tolerated exercise well with 4/10 chronic left knee pain and an average MET level of 1.7. Pt is learning his THRR, RPE and ExRx.       Expected Outcomes Will continue to monitor pt and progress workloads as tolerated without sign or symptom                Discharge Exercise Prescription (Final Exercise Prescription Changes):  Exercise Prescription Changes - 01/12/22 0837       Response to Exercise   Blood Pressure (Admit) 128/64    Blood Pressure (Exercise) 140/64    Blood Pressure (Exit) 114/68    Heart Rate (Admit) 66 bpm    Heart Rate (Exercise) 82 bpm     Heart Rate (Exit) 63 bpm    Rating of Perceived Exertion (Exercise) 10.5    Perceived Dyspnea (Exercise) 0    Symptoms 4/10 chronic L  knee pain    Comments First day in CRP2 program    Duration Progress to 30 minutes of  aerobic without signs/symptoms of physical distress    Intensity THRR unchanged      Progression   Progression Continue to progress workloads to maintain intensity without signs/symptoms of physical distress.    Average METs 1.7      Resistance Training   Training Prescription Yes    Weight 3 lbs    Reps 10-15    Time 10 Minutes      Recumbant Bike   Level 1.5    Minutes 15    METs 1.6      NuStep   Level 2    SPM 85    Minutes 15    METs 1.8             Nutrition:  Target Goals: Understanding of nutrition guidelines, daily intake of sodium 1500mg , cholesterol 200mg , calories 30% from fat and 7% or less from saturated fats, daily to have 5 or more servings of fruits and vegetables.  Biometrics:  Pre Biometrics - 01/06/22 0834       Pre Biometrics   Waist Circumference 48 inches    Hip Circumference 47 inches    Waist to Hip Ratio 1.02 %    Triceps Skinfold 20 mm    % Body Fat 35.1 %    Grip Strength 34 kg    Flexibility --   Not performed, issues with back pain.   Single Leg Stand 1.5 seconds              Nutrition Therapy Plan and Nutrition Goals:  Nutrition Therapy & Goals - 01/12/22 1630       Nutrition Therapy   Diet Heart Healthy Diet    Drug/Food Interactions Statins/Certain Fruits      Personal Nutrition Goals   Nutrition Goal Patient to choose a daily variety of fruits, vegetables, whole grains, lean protein, nonfat/low fat dairy as part of heart healthy lifestyle    Personal Goal #2 Patient to limit sodium intake to 1500mg  daily    Personal Goal #3 Patient to identify and limit daily intake of saturated fat, trans fat, sodium, and refined carbohydrates      Intervention Plan   Intervention Prescribe, educate and  counsel regarding individualized specific dietary modifications aiming towards targeted core components such as weight, hypertension, lipid management, diabetes, heart failure and other comorbidities.;Nutrition handout(s) given to patient.    Expected Outcomes Short Term Goal: Understand basic principles of dietary content, such as calories, fat, sodium, cholesterol and nutrients.;Long Term Goal: Adherence to prescribed nutrition plan.             Nutrition Assessments:  MEDIFICTS Score Key: ?70 Need to make dietary changes  40-70 Heart Healthy Diet ? 40 Therapeutic Level Cholesterol Diet    Picture Your Plate Scores: <99 Unhealthy dietary pattern with much room for improvement. 41-50 Dietary pattern unlikely to meet recommendations for good health and room for improvement. 51-60 More healthful dietary pattern, with some room for improvement.  >60 Healthy dietary pattern, although there may be some specific behaviors that could be improved.    Nutrition Goals Re-Evaluation:  Nutrition Goals Re-Evaluation     Row Name 01/12/22 1630             Goals   Current Weight 255 lb 15.3 oz (116.1 kg)       Comment A1c normal, LDL 112  Nutrition Goals Re-Evaluation:  Nutrition Goals Re-Evaluation     Row Name 01/12/22 1630             Goals   Current Weight 255 lb 15.3 oz (116.1 kg)       Comment A1c normal, LDL 112                Nutrition Goals Discharge (Final Nutrition Goals Re-Evaluation):  Nutrition Goals Re-Evaluation - 01/12/22 1630       Goals   Current Weight 255 lb 15.3 oz (116.1 kg)    Comment A1c normal, LDL 112             Psychosocial: Target Goals: Acknowledge presence or absence of significant depression and/or stress, maximize coping skills, provide positive support system. Participant is able to verbalize types and ability to use techniques and skills needed for reducing stress and depression.  Initial Review &  Psychosocial Screening:  Initial Psych Review & Screening - 01/06/22 1031       Initial Review   Current issues with None Identified      Family Dynamics   Good Support System? Yes    Comments Patient reports good support system with his wife, 2 children and his grandchildren.      Barriers   Psychosocial barriers to participate in program There are no identifiable barriers or psychosocial needs.             Quality of Life Scores:  Quality of Life - 01/06/22 1012       Quality of Life   Select Quality of Life      Quality of Life Scores   Health/Function Pre 24.4 %    Socioeconomic Pre 28.29 %    Psych/Spiritual Pre 30 %    Family Pre 28.8 %    GLOBAL Pre 27 %            Scores of 19 and below usually indicate a poorer quality of life in these areas.  A difference of  2-3 points is a clinically meaningful difference.  A difference of 2-3 points in the total score of the Quality of Life Index has been associated with significant improvement in overall quality of life, self-image, physical symptoms, and general health in studies assessing change in quality of life.  PHQ-9: Review Flowsheet       01/06/2022 01/22/2014  Depression screen PHQ 2/9  Decreased Interest 0 0  Down, Depressed, Hopeless 0 0  PHQ - 2 Score 0 0   Interpretation of Total Score  Total Score Depression Severity:  1-4 = Minimal depression, 5-9 = Mild depression, 10-14 = Moderate depression, 15-19 = Moderately severe depression, 20-27 = Severe depression   Psychosocial Evaluation and Intervention:  Psychosocial Evaluation - 01/21/22 1224       Psychosocial Evaluation & Interventions   Interventions Stress management education;Relaxation education;Encouraged to exercise with the program and follow exercise prescription    Comments Tonya feels supported by his family  in his efforts to adopt a heart healthy lifestyle.  No identified barriers to participating in cardiac rehab    Expected  Outcomes Saladin will continue to be supported by his family and no reportable barriers to participating in cardiac rehab    Continue Psychosocial Services  No Follow up required             Psychosocial Re-Evaluation:   Psychosocial Discharge (Final Psychosocial Re-Evaluation):   Vocational Rehabilitation: Provide vocational rehab assistance to qualifying candidates.  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 Participant will state understanding/return demonstration of topics presented.    Education     Row Name 01/12/22 0900     Education   Cardiac Education Topics Pritikin   Select Workshops     Workshops   Educator Dietitian   Select Nutrition   Nutrition Workshop Fueling a Designer, multimedia   Instruction Review Code 1- Verbalizes Understanding   Class Start Time 0815   Class Stop Time 0904   Class Time Calculation (min) 49 min    [place] Name 01/14/22 0900     Education   Cardiac Education Topics [employer]   Educator Dietitian   Weekly Topic Adding Flavor - Sodium-Free   Instruction Review Code 1- Verbalizes Understanding   Class Start Time 0815   Class Stop Time 3662   Class Time Calculation (min) 42 min    [place] Name 01/16/22 0800     Education   Cardiac Education Topics Pritikin   Select Core Videos     Core Videos   Educator Nurse   Select General Education   General Education Heart Disease Risk Reduction   Instruction Review Code 1- Verbalizes Understanding   Class Start Time 0815   Class Stop Time 9476   Class Time  Calculation (min) 40 min    [place] Name 01/19/22 0900     Education   Cardiac Education Topics Pritikin   Select Workshops     Workshops   Educator Exercise Physiologist   Select Psychosocial   Psychosocial Workshop Recognizing and Reducing Stress   Instruction Review Code 1- Verbalizes Understanding   Class Start Time 0810   Class Stop Time 0904   Class Time Calculation (min) 54 min    [place] Name 01/21/22 0900     Education   Cardiac Education Topics [employer]   Educator Dietitian   Weekly Topic Fast and Healthy Breakfasts   Instruction Review Code 1- Verbalizes Understanding   Class Start Time 0815   Class Stop Time 0905   Class Time Calculation (min) 50 min            Core Videos: Exercise    Move It!  Clinical staff conducted group or individual video education with verbal and written material and guidebook.  Patient learns the recommended Pritikin exercise program. Exercise with the goal of living a long, healthy life. Some of the health benefits of exercise include controlled diabetes, healthier blood pressure levels, improved cholesterol levels, improved heart and lung capacity, improved sleep, and better body composition. Everyone should speak with their doctor before starting or changing an exercise routine.  Biomechanical Limitations Clinical staff conducted group or individual video education with verbal and written material and guidebook.  Patient learns how biomechanical limitations can impact exercise and how we can mitigate and possibly overcome limitations to have an impactful and balanced exercise routine.  Body Composition Clinical staff conducted group or individual video education with verbal and  written material and guidebook.  Patient learns that body composition (ratio of muscle mass to fat mass) is a key component to assessing overall fitness, rather than body weight alone. Increased fat mass, especially  visceral belly fat, can put [place] at increased risk for metabolic syndrome, type 2 diabetes, heart disease, and even death. It is recommended to combine diet and exercise (cardiovascular and resistance training) to improve your body composition. Seek guidance from your physician and exercise physiologist before implementing an exercise routine.  Exercise Action Plan Clinical staff conducted group or individual video education with verbal and written material and guidebook.  Patient learns the recommended strategies to achieve and enjoy long-term exercise adherence, including variety, self-motivation, self-efficacy, and positive decision making. Benefits of exercise include fitness, good health, weight management, more energy, better sleep, less stress, and overall well-being.  Medical   Heart Disease Risk Reduction Clinical staff conducted group or individual video education with verbal and written material and guidebook.  Patient learns our heart is our most vital organ as it circulates oxygen, nutrients, white blood cells, and hormones throughout the entire body, and carries waste away. Data supports a plant-based eating plan like the Pritikin Program for its effectiveness in slowing progression of and reversing heart disease. The video provides a number of recommendations to address heart disease.   Metabolic Syndrome and Belly Fat  Clinical staff conducted group or individual video education with verbal and written material and guidebook.  Patient learns what metabolic syndrome is, how it leads to heart disease, and how one can reverse it and keep it from coming back. You have metabolic syndrome if you have 3 of the following 5 criteria: abdominal obesity, high blood pressure, high triglycerides, low HDL cholesterol, and high blood sugar.  Hypertension and Heart Disease Clinical staff conducted group or individual video education with verbal and written material and guidebook.  Patient learns that  high blood pressure, or hypertension, is very common in the [place]. Hypertension is largely due to excessive salt intake, but other important risk factors include being overweight, physical inactivity, drinking too much alcohol, smoking, and not eating enough potassium from fruits and vegetables. High blood pressure is a leading risk factor for heart attack, stroke, congestive heart failure, dementia, kidney failure, and premature death. Long-term effects of excessive salt intake include stiffening of the arteries and thickening of heart muscle and organ damage. Recommendations include ways to reduce hypertension and the risk of heart disease.  Diseases of Our Time - Focusing on Diabetes Clinical staff conducted group or individual video education with verbal and written material and guidebook.  Patient learns why the best way to stop diseases of our time is prevention, through food and other lifestyle changes. Medicine (such as prescription pills and surgeries) is often only a Band-Aid on the problem, not a long-term solution. Most common diseases of our time include obesity, type 2 diabetes, hypertension, heart disease, and cancer. The Pritikin Program is recommended and has been proven to help reduce, reverse, and/or prevent the damaging effects of metabolic syndrome.  Nutrition   Overview of the Pritikin Eating Plan  Clinical staff conducted group or individual video education with verbal and written material and guidebook.  Patient learns about the [place] for disease risk reduction. The [place] emphasizes a wide variety of unrefined, minimally-processed carbohydrates, like fruits, vegetables, whole grains, and legumes. Go, Caution, and Stop food choices are explained. Plant-based and lean animal proteins are emphasized. Rationale provided for low sodium  intake for blood pressure control, low added sugars for blood sugar stabilization, and low added fats and oils for  coronary artery disease risk reduction and weight management.  Calorie Density  Clinical staff conducted group or individual video education with verbal and written material and guidebook.  Patient learns about calorie density and how it impacts the Pritikin Eating Plan. Knowing the characteristics of the food you choose will help you decide whether those foods will lead to weight gain or weight loss, and whether you want to consume more or less of them. Weight loss is usually a side effect of the Pritikin Eating Plan because of its focus on low calorie-dense foods.  Label Reading  Clinical staff conducted group or individual video education with verbal and written material and guidebook.  Patient learns about the Pritikin recommended label reading guidelines and corresponding recommendations regarding calorie density, added sugars, sodium content, and whole grains.  Dining Out - Part 1  Clinical staff conducted group or individual video education with verbal and written material and guidebook.  Patient learns that restaurant meals can be sabotaging because they can be so high in calories, fat, sodium, and/or sugar. Patient learns recommended strategies on how to positively address this and avoid unhealthy pitfalls.  Facts on Fats  Clinical staff conducted group or individual video education with verbal and written material and guidebook.  Patient learns that lifestyle modifications can be just as effective, if not more so, as many medications for lowering your risk of heart disease. A Pritikin lifestyle can help to reduce your risk of inflammation and atherosclerosis (cholesterol build-up, or plaque, in the artery walls). Lifestyle interventions such as dietary choices and physical activity address the cause of atherosclerosis. A review of the types of fats and their impact on blood cholesterol levels, along with dietary recommendations to reduce fat intake is also included.  Nutrition Action Plan   Clinical staff conducted group or individual video education with verbal and written material and guidebook.  Patient learns how to incorporate Pritikin recommendations into their lifestyle. Recommendations include planning and keeping personal health goals in mind as an important part of their success.  Healthy Mind-Set    Healthy Minds, Bodies, Hearts  Clinical staff conducted group or individual video education with verbal and written material and guidebook.  Patient learns how to identify when they are stressed. Video will discuss the impact of that stress, as well as the many benefits of stress management. Patient will also be introduced to stress management techniques. The way we think, act, and feel has an impact on our hearts.  How Our Thoughts Can Heal Our Hearts  Clinical staff conducted group or individual video education with verbal and written material and guidebook.  Patient learns that negative thoughts can cause depression and anxiety. This can result in negative lifestyle behavior and serious health problems. Cognitive behavioral therapy is an effective method to help control our thoughts in order to change and improve our emotional outlook.  Additional Videos:  Exercise    Improving Performance  Clinical staff conducted group or individual video education with verbal and written material and guidebook.  Patient learns to use a non-linear approach by alternating intensity levels and lengths of time spent exercising to help burn more calories and lose more body fat. Cardiovascular exercise helps improve heart health, metabolism, hormonal balance, blood sugar control, and recovery from fatigue. Resistance training improves strength, endurance, balance, coordination, reaction time, metabolism, and muscle mass. Flexibility exercise improves circulation, posture, and balance.  Seek guidance from your physician and exercise physiologist before implementing an exercise routine and learn  your capabilities and proper form for all exercise.  Introduction to Yoga  Clinical staff conducted group or individual video education with verbal and written material and guidebook.  Patient learns about yoga, a discipline of the coming together of mind, breath, and body. The benefits of yoga include improved flexibility, improved range of motion, better posture and core strength, increased lung function, weight loss, and positive self-image. Yoga's heart health benefits include lowered blood pressure, healthier heart rate, decreased cholesterol and triglyceride levels, improved immune function, and reduced stress. Seek guidance from your physician and exercise physiologist before implementing an exercise routine and learn your capabilities and proper form for all exercise.  Medical   Aging: Enhancing Your Quality of Life  Clinical staff conducted group or individual video education with verbal and written material and guidebook.  Patient learns key strategies and recommendations to stay in good physical health and enhance quality of life, such as prevention strategies, having an advocate, securing a [place], and keeping a list of medications and system for tracking them. It also discusses how to avoid risk for bone loss.  Biology of Weight Control  Clinical staff conducted group or individual video education with verbal and written material and guidebook.  Patient learns that weight gain occurs because we consume more calories than we burn (eating more, moving less). Even if your body weight is normal, you may have higher ratios of fat compared to muscle mass. Too much body fat puts you at increased risk for cardiovascular disease, heart attack, stroke, type 2 diabetes, and obesity-related cancers. In addition to exercise, following the [place] can help reduce your risk.  Decoding Lab Results  Clinical staff conducted group or individual video education  with verbal and written material and guidebook.  Patient learns that lab test reflects one measurement whose values change over time and are influenced by many factors, including medication, stress, sleep, exercise, food, hydration, pre-existing medical conditions, and more. It is recommended to use the knowledge from this video to become more involved with your lab results and evaluate your numbers to speak with your doctor.   Diseases of Our Time - Overview  Clinical staff conducted group or individual video education with verbal and written material and guidebook.  Patient learns that according to the CDC, 50% to 70% of chronic diseases (such as obesity, type 2 diabetes, elevated lipids, hypertension, and heart disease) are avoidable through lifestyle improvements including healthier food choices, listening to satiety cues, and increased physical activity.  Sleep Disorders Clinical staff conducted group or individual video education with verbal and written material and guidebook.  Patient learns how good quality and duration of sleep are important to overall health and well-being. Patient also learns about sleep disorders and how they impact health along with recommendations to address them, including discussing with a physician.  Nutrition  Dining Out - Part 2 Clinical staff conducted group or individual video education with verbal and written material and guidebook.  Patient learns how to plan ahead and communicate in order to maximize their dining experience in a healthy and nutritious manner. Included are recommended food choices based on the type of restaurant the patient is visiting.   Fueling a Best boy conducted group or individual video education with verbal and written material and guidebook.  There is a strong connection between our food choices and  our health. Diseases like obesity and type 2 diabetes are very prevalent and are in large-part due to lifestyle  choices. The Pritikin Eating Plan provides plenty of food and hunger-curbing satisfaction. It is easy to follow, affordable, and helps reduce health risks.  Menu Workshop  Clinical staff conducted group or individual video education with verbal and written material and guidebook.  Patient learns that restaurant meals can sabotage health goals because they are often packed with calories, fat, sodium, and sugar. Recommendations include strategies to plan ahead and to communicate with the manager, chef, or server to help order a healthier meal.  Planning Your Eating Strategy  Clinical staff conducted group or individual video education with verbal and written material and guidebook.  Patient learns about the [place] and its benefit of reducing the risk of disease. The [place] does not focus on calories. Instead, it emphasizes high-quality, nutrient-rich foods. By knowing the characteristics of the foods, we choose, we can determine their calorie density and make informed decisions.  Targeting Your Nutrition Priorities  Clinical staff conducted group or individual video education with verbal and written material and guidebook.  Patient learns that lifestyle habits have a tremendous impact on disease risk and progression. This video provides eating and physical activity recommendations based on your personal health goals, such as reducing LDL cholesterol, losing weight, preventing or controlling type 2 diabetes, and reducing high blood pressure.  Vitamins and Minerals  Clinical staff conducted group or individual video education with verbal and written material and guidebook.  Patient learns different ways to obtain key vitamins and minerals, including through a recommended healthy diet. It is important to discuss all supplements you take with your doctor.   Healthy Mind-Set    Smoking Cessation  Clinical staff conducted group or individual video education with verbal and  written material and guidebook.  Patient learns that cigarette smoking and tobacco addiction pose a serious health risk which affects millions of people. Stopping smoking will significantly reduce the risk of heart disease, lung disease, and many forms of cancer. Recommended strategies for quitting are covered, including working with your doctor to develop a successful plan.  Culinary   Becoming a Financial trader conducted group or individual video education with verbal and written material and guidebook.  Patient learns that cooking at home can be healthy, cost-effective, quick, and puts them in control. Keys to cooking healthy recipes will include looking at your recipe, assessing your equipment needs, planning ahead, making it simple, choosing cost-effective seasonal ingredients, and limiting the use of added fats, salts, and sugars.  Cooking - Breakfast and Snacks  Clinical staff conducted group or individual video education with verbal and written material and guidebook.  Patient learns how important breakfast is to satiety and nutrition through the entire day. Recommendations include key foods to eat during breakfast to help stabilize blood sugar levels and to prevent overeating at meals later in the day. Planning ahead is also a key component.  Cooking - Human resources officer conducted group or individual video education with verbal and written material and guidebook.  Patient learns eating strategies to improve overall health, including an approach to cook more at home. Recommendations include thinking of animal protein as a side on your plate rather than center stage and focusing instead on lower calorie dense options like vegetables, fruits, whole grains, and plant-based proteins, such as beans. Making sauces in large quantities to freeze for later and leaving the  skin on your vegetables are also recommended to maximize your experience.  Cooking - Healthy Salads and  Dressing Clinical staff conducted group or individual video education with verbal and written material and guidebook.  Patient learns that vegetables, fruits, whole grains, and legumes are the foundations of the [place]. Recommendations include how to incorporate each of these in flavorful and healthy salads, and how to create homemade salad dressings. Proper handling of ingredients is also covered. Cooking - Soups and [employer] - Soups and Desserts Clinical staff conducted group or individual video education with verbal and written material and guidebook.  Patient learns that Pritikin soups and desserts make for easy, nutritious, and delicious snacks and meal components that are low in sodium, fat, sugar, and calorie density, while high in vitamins, minerals, and filling fiber. Recommendations include simple and healthy ideas for soups and desserts.   Overview     The Pritikin Solution Program Overview Clinical staff conducted group or individual video education with verbal and written material and guidebook.  Patient learns that the results of the [place] Program have been documented in more than 100 articles published in peer-reviewed journals, and the benefits include reducing risk factors for (and, in some cases, even reversing) high cholesterol, high blood pressure, type 2 diabetes, obesity, and more! An overview of the three key pillars of the Pritikin Program will be covered: eating well, doing regular exercise, and having a healthy mind-set.  WORKSHOPS  Exercise: Exercise Basics: Building Your Action Plan Clinical staff led group instruction and group discussion with PowerPoint presentation and patient guidebook. To enhance the learning environment the use of posters, models and videos may be added. At the conclusion of this workshop, patients will comprehend the difference between physical activity and exercise, as well as the benefits of incorporating both, into  their routine. Patients will understand the FITT (Frequency, Intensity, Time, and Type) principle and how to use it to build an exercise action plan. In addition, safety concerns and other considerations for exercise and cardiac rehab will be addressed by the presenter. The purpose of this lesson is to promote a comprehensive and effective weekly exercise routine in order to improve patients' overall level of fitness.   Managing Heart Disease: Your Path to a Healthier Heart Clinical staff led group instruction and group discussion with PowerPoint presentation and patient guidebook. To enhance the learning environment the use of posters, models and videos may be added.At the conclusion of this workshop, patients will understand the anatomy and physiology of the heart. Additionally, they will understand how Pritikin's three pillars impact the risk factors, the progression, and the management of heart disease.  The purpose of this lesson is to provide a high-level overview of the heart, heart disease, and how the Pritikin lifestyle positively impacts risk factors.  Exercise Biomechanics Clinical staff led group instruction and group discussion with PowerPoint presentation and patient guidebook. To enhance the learning environment the use of posters, models and videos may be added. Patients will learn how the structural parts of their bodies function and how these functions impact their daily activities, movement, and exercise. Patients will learn how to promote a neutral spine, learn how to manage pain, and identify ways to improve their physical movement in order to promote healthy living. The purpose of this lesson is to expose patients to common physical limitations that impact physical activity. Participants will learn practical ways to adapt and manage aches and pains, and to minimize their effect on regular exercise.  Patients will learn how to maintain good posture while sitting, walking, and  lifting.  Balance Training and Fall Prevention  Clinical staff led group instruction and group discussion with PowerPoint presentation and patient guidebook. To enhance the learning environment the use of posters, models and videos may be added. At the conclusion of this workshop, patients will understand the importance of their sensorimotor skills (vision, proprioception, and the vestibular system) in maintaining their ability to balance as they age. Patients will apply a variety of balancing exercises that are appropriate for their current level of function. Patients will understand the common causes for poor balance, possible solutions to these problems, and ways to modify their physical environment in order to minimize their fall risk. The purpose of this lesson is to teach patients about the importance of maintaining balance as they age and ways to minimize their risk of falling.  WORKSHOPS   Nutrition:  Fueling a Scientist, research (physical sciences) led group instruction and group discussion with PowerPoint presentation and patient guidebook. To enhance the learning environment the use of posters, models and videos may be added. Patients will review the foundational principles of the [place] and understand what constitutes a serving size in each of the food groups. Patients will also learn Pritikin-friendly foods that are better choices when away from home and review make-ahead meal and snack options. Calorie density will be reviewed and applied to three nutrition priorities: weight maintenance, weight loss, and weight gain. The purpose of this lesson is to reinforce (in a group setting) the key concepts around what patients are recommended to eat and how to apply these guidelines when away from home by planning and selecting Pritikin-friendly options. Patients will understand how calorie density may be adjusted for different weight management goals.  Mindful Eating  Clinical staff led  group instruction and group discussion with PowerPoint presentation and patient guidebook. To enhance the learning environment the use of posters, models and videos may be added. Patients will briefly review the concepts of the [place] and the importance of low-calorie dense foods. The concept of mindful eating will be introduced as well as the importance of paying attention to internal hunger signals. Triggers for non-hunger eating and techniques for dealing with triggers will be explored. The purpose of this lesson is to provide patients with the opportunity to review the basic principles of the [place], discuss the value of eating mindfully and how to measure internal cues of hunger and fullness using the Hunger Scale. Patients will also discuss reasons for non-hunger eating and learn strategies to use for controlling emotional eating.  Targeting Your Nutrition Priorities Clinical staff led group instruction and group discussion with PowerPoint presentation and patient guidebook. To enhance the learning environment the use of posters, models and videos may be added. Patients will learn how to determine their genetic susceptibility to disease by reviewing their family history. Patients will gain insight into the importance of diet as part of an overall healthy lifestyle in mitigating the impact of genetics and other environmental insults. The purpose of this lesson is to provide patients with the opportunity to assess their personal nutrition priorities by looking at their family history, their own health history and current risk factors. Patients will also be able to discuss ways of prioritizing and modifying the [place] for their highest risk areas  Menu  Clinical staff led group instruction and group discussion with PowerPoint presentation and patient guidebook. To enhance the learning environment  the use of posters, models and videos may be added. Using menus  brought in from [employer], or printed from [employer], patients will apply the [place] dining out guidelines that were presented in the [employer] video. Patients will also be able to practice these guidelines in a variety of provided scenarios. The purpose of this lesson is to provide patients with the opportunity to practice hands-on learning of the [place] with actual menus and practice scenarios.  Label Reading Clinical staff led group instruction and group discussion with PowerPoint presentation and patient guidebook. To enhance the learning environment the use of posters, models and videos may be added. Patients will review and discuss the Pritikin label reading guidelines presented in Pritikin's Label Reading Educational series video. Using fool labels brought in from local grocery stores and markets, patients will apply the label reading guidelines and determine if the packaged food meet the Pritikin guidelines. The purpose of this lesson is to provide patients with the opportunity to review, discuss, and practice hands-on learning of the Pritikin Label Reading guidelines with actual packaged food labels. [place] Workshops are designed to teach patients ways to prepare quick, simple, and affordable recipes at home. The importance of nutrition's role in chronic disease risk reduction is reflected in its emphasis in the overall Pritikin program. By learning how to prepare essential core Pritikin Eating Plan recipes, patients will increase control over what they eat; be able to customize the flavor of foods without the use of added salt, sugar, or fat; and improve the quality of the food they consume. By learning a set of core recipes which are easily assembled, quickly prepared, and affordable, patients are more likely to prepare more healthy foods at home. These workshops focus on convenient breakfasts, simple  entres, side dishes, and desserts which can be prepared with minimal effort and are consistent with nutrition recommendations for cardiovascular risk reduction. Cooking [employer] are taught by a Engineer, materials (RD) who has been trained by the [employer]. The chef or RD has a clear understanding of the importance of minimizing - if not completely eliminating - added fat, sugar, and sodium in recipes. Throughout the series of [place] Workshop sessions, patients will learn about healthy ingredients and efficient methods of cooking to build confidence in their capability to prepare    Cooking School weekly topics:  Adding Flavor- Sodium-Free  Fast and Healthy Breakfasts  Powerhouse Plant-Based Proteins  Satisfying Salads and Dressings  Simple Sides and Sauces  International Cuisine-Spotlight on the [place] Zones  Delicious Desserts  Savory Soups  Efficiency Cooking - Meals in a Snap  Tasty Appetizers and Snacks  Comforting Weekend Breakfasts  One-Pot Wonders   Fast Evening Meals  Easy [place] (Psychosocial): New Thoughts, New Behaviors Clinical staff led group instruction and group discussion with PowerPoint presentation and patient guidebook. To enhance the learning environment the use of posters, models and videos may be added. Patients will learn and practice techniques for developing effective health and lifestyle goals. Patients will be able to effectively apply the goal setting process learned to develop at least one new personal goal.  The purpose of this lesson is to expose patients to a new skill set of behavior modification techniques such as techniques setting SMART goals, overcoming barriers, and achieving new thoughts and new behaviors.  Managing Moods and Relationships Clinical staff  led group instruction and group discussion with PowerPoint presentation and patient  guidebook. To enhance the learning environment the use of posters, models and videos may be added. Patients will learn how emotional and chronic stress factors can impact their health and relationships. They will learn healthy ways to manage their moods and utilize positive coping mechanisms. In addition, ICR patients will learn ways to improve communication skills. The purpose of this lesson is to expose patients to ways of understanding how one's mood and health are intimately connected. Developing a healthy outlook can help build positive relationships and connections with others. Patients will understand the importance of utilizing effective communication skills that include actively listening and being heard. They will learn and understand the importance of the "4 Cs" and especially Connections in fostering of a Healthy Mind-Set.  Healthy Sleep for a Healthy Heart Clinical staff led group instruction and group discussion with PowerPoint presentation and patient guidebook. To enhance the learning environment the use of posters, models and videos may be added. At the conclusion of this workshop, patients will be able to demonstrate knowledge of the importance of sleep to overall health, well-being, and quality of life. They will understand the symptoms of, and treatments for, common sleep disorders. Patients will also be able to identify daytime and nighttime behaviors which impact sleep, and they will be able to apply these tools to help manage sleep-related challenges. The purpose of this lesson is to provide patients with a general overview of sleep and outline the importance of quality sleep. Patients will learn about a few of the most common sleep disorders. Patients will also be introduced to the concept of "sleep hygiene," and discover ways to self-manage certain sleeping problems through simple daily behavior changes. Finally, the workshop will motivate patients by clarifying the links between quality  sleep and their goals of heart-healthy living.   Recognizing and Reducing Stress Clinical staff led group instruction and group discussion with PowerPoint presentation and patient guidebook. To enhance the learning environment the use of posters, models and videos may be added. At the conclusion of this workshop, patients will be able to understand the types of stress reactions, differentiate between acute and chronic stress, and recognize the impact that chronic stress has on their health. They will also be able to apply different coping mechanisms, such as reframing negative self-talk. Patients will have the opportunity to practice a variety of stress management techniques, such as deep abdominal breathing, progressive muscle relaxation, and/or guided imagery.  The purpose of this lesson is to educate patients on the role of stress in their lives and to provide healthy techniques for coping with it.  Learning Barriers/Preferences:  Learning Barriers/Preferences - 01/06/22 1006       Learning Barriers/Preferences   Learning Barriers None    Learning Preferences Verbal Instruction;Written Material             Education Topics:  Knowledge Questionnaire Score:   Core Components/Risk Factors/Patient Goals at Admission:  Personal Goals and Risk Factors at Admission - 01/06/22 0848       Core Components/Risk Factors/Patient Goals on Admission    Weight Management Yes;Obesity;Weight Loss    Intervention Weight Management/Obesity: Establish reasonable short term and long term weight goals.;Obesity: Provide education and appropriate resources to help participant work on and attain dietary goals.    Admit Weight 258 lb 3.2 oz (117.1 kg)    Goal Weight: Short Term 250 lb (113.4 kg)    Goal Weight: Long Term 220 lb (  99.8 kg)    Expected Outcomes Short Term: Continue to assess and modify interventions until short term weight is achieved;Long Term: Adherence to nutrition and physical  activity/exercise program aimed toward attainment of established weight goal;Weight Loss: Understanding of general recommendations for a balanced deficit meal plan, which promotes 1-2 lb weight loss per week and includes a negative energy balance of [phone] kcal/d    Hypertension Yes    Intervention Provide education on lifestyle modifcations including regular physical activity/exercise, weight management, moderate sodium restriction and increased consumption of fresh fruit, vegetables, and low fat dairy, alcohol moderation, and smoking cessation.;Monitor prescription use compliance.    Expected Outcomes Short Term: Continued assessment and intervention until BP is < 140/72mm HG in hypertensive participants. < 130/77mm HG in hypertensive participants with diabetes, heart failure or chronic kidney disease.;Long Term: Maintenance of blood pressure at goal levels.    Lipids Yes    Intervention Provide education and support for participant on nutrition & aerobic/resistive exercise along with prescribed medications to achieve LDL '70mg'$ , HDL >$Remo'40mg'pXjwj$ .    Expected Outcomes Short Term: Participant states understanding of desired cholesterol values and is compliant with medications prescribed. Participant is following exercise prescription and nutrition guidelines.;Long Term: Cholesterol controlled with medications as prescribed, with individualized exercise RX and with personalized nutrition plan. Value goals: LDL < $Rem'70mg'enah$ , HDL > 40 mg.             Core Components/Risk Factors/Patient Goals Review:   Goals and Risk Factor Review     Row Name 01/12/22 1442 01/21/22 1226           Core Components/Risk Factors/Patient Goals Review   Personal Goals Review Weight Management/Obesity;Hypertension;Lipids Weight Management/Obesity;Hypertension;Lipids      Review Patient started intensive cardiac rehab today, tolerated well, vss, c/o 4/10 chronic knee pain. Jullien has completed 6 exercise and some educational  sessions.  Isamu is progressing  well with workloads and increased MET. Seen by neuro for leg weakness and drooling.  Weight measurement show a decrease of 3 kg since begining cardiac rehab.  Pt with normal and appopriate BP readings and is compliant with statin therapy.      Expected Outcomes Patient will maintain a healthy lifestyle, as he is provided the knowledge and tools neccessary to complete goal; he will increase exercise stamina during ICR program. Patient will maintain a healthy lifestyle, as he is provided the knowledge and tools neccessary to complete goal; he will increase exercise stamina during ICR program.               Core Components/Risk Factors/Patient Goals at Discharge (Final Review):   Goals and Risk Factor Review - 01/21/22 1226       Core Components/Risk Factors/Patient Goals Review   Personal Goals Review Weight Management/Obesity;Hypertension;Lipids    Review [place] has completed 6 exercise and some educational sessions.  Zebulun is progressing  well with workloads and increased MET. Seen by neuro for leg weakness and drooling.  Weight measurement show a decrease of 3 kg since begining cardiac rehab.  Pt with normal and appopriate BP readings and is compliant with statin therapy.    Expected Outcomes Patient will maintain a healthy lifestyle, as he is provided the knowledge and tools neccessary to complete goal; he will increase exercise stamina during ICR program.             ITP Comments:  ITP Comments     Row Name 01/06/22 [phone] 01/06/22 0944 01/21/22 1021  ITP Comments Medical Director- Dr. Fransico Him, MD Dr. Fransico Him Medical Director: Introduction to [facility] Education program/ Intensive Cardiac Rehab, initial [facility] orientation pack reviewed 30 day ITP Review.  Karina is off to a great start              Comments: Pt is making expected progress toward personal goals after completing 6 sessions. Recommend continued exercise and  life style modification education including  stress management and relaxation techniques to decrease cardiac risk profile.  Cherre Huger, BSN Cardiac and Training and development officer

## 2022-01-21 NOTE — Telephone Encounter (Signed)
Office calling to f/u on Medical Clearance

## 2022-01-23 ENCOUNTER — Encounter (HOSPITAL_COMMUNITY)
Admission: RE | Admit: 2022-01-23 | Discharge: 2022-01-23 | Disposition: A | Discharge status: Home / Self Care | Payer: Medicare Other | Source: Ambulatory Visit | Attending: Cardiology | Admitting: Cardiology

## 2022-01-23 DIAGNOSIS — Z955 Presence of coronary angioplasty implant and graft: Secondary | ICD-10-CM

## 2022-01-23 DIAGNOSIS — Z48812 Encounter for surgical aftercare following surgery on the circulatory system: Secondary | ICD-10-CM | POA: Diagnosis not present

## 2022-01-23 NOTE — Telephone Encounter (Signed)
I will fax over the notes in regard to clearance that were faxed on 01/15/22. Please see notes per Dr. Percival Spanish pt not eligible to stop DAPT until the end of 04/2022 and to keep his f/u with him on 05/01/22.

## 2022-01-26 ENCOUNTER — Encounter (HOSPITAL_COMMUNITY)
Admission: RE | Admit: 2022-01-26 | Discharge: 2022-01-26 | Disposition: A | Discharge status: Home / Self Care | Payer: Medicare Other | Source: Ambulatory Visit | Attending: Cardiology | Admitting: Cardiology

## 2022-01-26 DIAGNOSIS — Z955 Presence of coronary angioplasty implant and graft: Secondary | ICD-10-CM | POA: Diagnosis not present

## 2022-01-26 DIAGNOSIS — Z48812 Encounter for surgical aftercare following surgery on the circulatory system: Secondary | ICD-10-CM | POA: Diagnosis not present

## 2022-01-26 NOTE — Telephone Encounter (Signed)
Patient called in wondering if injection was approved and if so can he be scheduled

## 2022-01-27 ENCOUNTER — Other Ambulatory Visit: Payer: Self-pay

## 2022-01-27 DIAGNOSIS — M1712 Unilateral primary osteoarthritis, left knee: Secondary | ICD-10-CM

## 2022-01-27 NOTE — Telephone Encounter (Signed)
Talked with patient and appointment is scheduled for gel injection.

## 2022-01-28 ENCOUNTER — Encounter (HOSPITAL_COMMUNITY)
Admission: RE | Admit: 2022-01-28 | Discharge: 2022-01-28 | Disposition: A | Discharge status: Home / Self Care | Payer: Medicare Other | Source: Ambulatory Visit | Attending: Cardiology | Admitting: Cardiology

## 2022-01-28 DIAGNOSIS — Z955 Presence of coronary angioplasty implant and graft: Secondary | ICD-10-CM

## 2022-01-28 DIAGNOSIS — Z48812 Encounter for surgical aftercare following surgery on the circulatory system: Secondary | ICD-10-CM | POA: Diagnosis not present

## 2022-01-28 NOTE — Progress Notes (Signed)
CARDIAC REHAB PHASE 2  Reviewed home exercise with pt today. Pt is tolerating exercise well. Pt will continue to exercise on his own by walking, treadmill, and swimming for 30-45 minutes per session 1-2 days a week in addition to the 3 days in CRP2. Pt is living an active lifestyle and is doing a lot of yard work. Advised pt on THRR, RPE scale, hydration and temperature/humidity precautions. Reinforced NTG use, S/S to stop exercise and when to call MD vs 911. Encouraged warm up cool down and stretches with exercise sessions. Pt verbalized understanding, all questions were answered and pt was given a copy to take home.    Kirby Funk ACSM-CEP 01/28/2022 8:29 AM

## 2022-01-30 ENCOUNTER — Encounter (HOSPITAL_COMMUNITY)
Admission: RE | Admit: 2022-01-30 | Discharge: 2022-01-30 | Disposition: A | Discharge status: Home / Self Care | Payer: Medicare Other | Source: Ambulatory Visit | Attending: Cardiology | Admitting: Cardiology

## 2022-01-30 DIAGNOSIS — Z48812 Encounter for surgical aftercare following surgery on the circulatory system: Secondary | ICD-10-CM | POA: Insufficient documentation

## 2022-01-30 DIAGNOSIS — Z955 Presence of coronary angioplasty implant and graft: Secondary | ICD-10-CM | POA: Insufficient documentation

## 2022-02-02 ENCOUNTER — Encounter: Payer: Self-pay | Admitting: Orthopaedic Surgery

## 2022-02-03 NOTE — Telephone Encounter (Signed)
Talked with patient and appointment has been rescheduled for Wednesday, 02/04/2022.

## 2022-02-04 ENCOUNTER — Ambulatory Visit: Payer: Medicare Other | Admitting: Orthopaedic Surgery

## 2022-02-04 ENCOUNTER — Encounter (HOSPITAL_COMMUNITY)
Admission: RE | Admit: 2022-02-04 | Discharge: 2022-02-04 | Disposition: A | Discharge status: Home / Self Care | Payer: Medicare Other | Source: Ambulatory Visit | Attending: Cardiology | Admitting: Cardiology

## 2022-02-04 ENCOUNTER — Encounter: Payer: Self-pay | Admitting: Orthopaedic Surgery

## 2022-02-04 DIAGNOSIS — Z955 Presence of coronary angioplasty implant and graft: Secondary | ICD-10-CM | POA: Diagnosis not present

## 2022-02-04 DIAGNOSIS — M1712 Unilateral primary osteoarthritis, left knee: Secondary | ICD-10-CM | POA: Diagnosis not present

## 2022-02-04 DIAGNOSIS — Z48812 Encounter for surgical aftercare following surgery on the circulatory system: Secondary | ICD-10-CM | POA: Diagnosis not present

## 2022-02-04 MED ORDER — SODIUM HYALURONATE 60 MG/3ML IX PRSY
60.0000 mg | PREFILLED_SYRINGE | INTRA_ARTICULAR | Status: AC | PRN
  Administered 2022-02-04: 60 mg via INTRA_ARTICULAR

## 2022-02-04 NOTE — Progress Notes (Signed)
   Procedure Note  Patient: Daniel Whitaker             Date of Birth: May 07, 1946           MRN: [id number]             Visit Date: 02/04/2022  Procedures: Visit Diagnoses:  1. Unilateral primary osteoarthritis, left knee     Large Joint Inj: L knee on 02/04/2022 11:21 AM Indications: diagnostic evaluation and pain Details: 22 G 1.5 in needle, superolateral approach  Arthrogram: No  Medications: 60 mg Sodium Hyaluronate 60 MG/3ML Outcome: tolerated well, no immediate complications Procedure, treatment alternatives, risks and benefits explained, specific risks discussed. Consent was given by the patient. Immediately prior to procedure a time out was called to verify the correct patient, procedure, equipment, support staff and site/side marked as required. Patient was prepped and draped in the usual sterile fashion.    Lot# [id number]  The patient comes in today for hyaluronic acid injection in his left knee with Durolane to treat the pain from osteoarthritis.  His knee is hurting on a daily basis and it is becoming a constant pain.  He has known meniscal tear in the knee but significant arthritis.  He cannot take anti-inflammatories due to being on Plavix.  I did aspirate 20 cc of clear fluid off of his left knee today and then placed Durolane in the left knee.  He is someone that I would like to see back in just 4 weeks given the pain that he is having.  He may need to consider knee replacement surgery if this does not help.

## 2022-02-06 ENCOUNTER — Encounter (HOSPITAL_COMMUNITY)
Admission: RE | Admit: 2022-02-06 | Discharge: 2022-02-06 | Disposition: A | Discharge status: Home / Self Care | Payer: Medicare Other | Source: Ambulatory Visit | Attending: Cardiology | Admitting: Cardiology

## 2022-02-06 DIAGNOSIS — Z955 Presence of coronary angioplasty implant and graft: Secondary | ICD-10-CM | POA: Diagnosis not present

## 2022-02-06 DIAGNOSIS — Z48812 Encounter for surgical aftercare following surgery on the circulatory system: Secondary | ICD-10-CM | POA: Diagnosis not present

## 2022-02-09 ENCOUNTER — Encounter (HOSPITAL_COMMUNITY)
Admission: RE | Admit: 2022-02-09 | Discharge: 2022-02-09 | Disposition: A | Discharge status: Home / Self Care | Payer: Medicare Other | Source: Ambulatory Visit | Attending: Cardiology | Admitting: Cardiology

## 2022-02-09 DIAGNOSIS — Z955 Presence of coronary angioplasty implant and graft: Secondary | ICD-10-CM

## 2022-02-09 DIAGNOSIS — Z48812 Encounter for surgical aftercare following surgery on the circulatory system: Secondary | ICD-10-CM | POA: Diagnosis not present

## 2022-02-09 NOTE — Telephone Encounter (Signed)
Office calling back to say they didn't received the fax, can your refax info. Please advise

## 2022-02-10 NOTE — Telephone Encounter (Signed)
Left message for [facility] with Dr. Collie Siad office, notes have been re-faxed again today, with the recommendations from Dr. Percival Spanish.

## 2022-02-11 ENCOUNTER — Telehealth (HOSPITAL_COMMUNITY): Payer: Self-pay | Admitting: *Deleted

## 2022-02-11 ENCOUNTER — Encounter (HOSPITAL_COMMUNITY): Payer: Medicare Other

## 2022-02-11 NOTE — Telephone Encounter (Signed)
Patient left message on department voicemail this morning. He will be absent from cardiac rehab today. His legs are bothering him from activity that he did yesterday.

## 2022-02-13 ENCOUNTER — Encounter (HOSPITAL_COMMUNITY)
Admission: RE | Admit: 2022-02-13 | Discharge: 2022-02-13 | Disposition: A | Discharge status: Home / Self Care | Payer: Medicare Other | Source: Ambulatory Visit | Attending: Cardiology | Admitting: Cardiology

## 2022-02-13 DIAGNOSIS — Z48812 Encounter for surgical aftercare following surgery on the circulatory system: Secondary | ICD-10-CM | POA: Diagnosis not present

## 2022-02-13 DIAGNOSIS — Z955 Presence of coronary angioplasty implant and graft: Secondary | ICD-10-CM | POA: Diagnosis not present

## 2022-02-16 ENCOUNTER — Ambulatory Visit: Payer: Medicare Other | Admitting: Orthopaedic Surgery

## 2022-02-16 ENCOUNTER — Encounter (HOSPITAL_COMMUNITY)
Admission: RE | Admit: 2022-02-16 | Discharge: 2022-02-16 | Disposition: A | Discharge status: Home / Self Care | Payer: Medicare Other | Source: Ambulatory Visit | Attending: Cardiology | Admitting: Cardiology

## 2022-02-16 DIAGNOSIS — Z955 Presence of coronary angioplasty implant and graft: Secondary | ICD-10-CM | POA: Diagnosis not present

## 2022-02-16 DIAGNOSIS — Z48812 Encounter for surgical aftercare following surgery on the circulatory system: Secondary | ICD-10-CM | POA: Diagnosis not present

## 2022-02-17 NOTE — Progress Notes (Signed)
Cardiac Individual Treatment Plan  Patient Details  Name: Daniel Whitaker MRN: [id number] Date of Birth: [birth year] Referring Provider:   Flowsheet Row INTENSIVE CARDIAC REHAB ORIENT from 01/06/2022 in [facility] [facility]  Referring Provider Minus Breeding, MD       Initial Encounter Date:  [place] from 01/06/2022 in [place]  Date 01/06/22       Visit Diagnosis: 10/22/21 DES LAD  Patient's Home Medications on Admission:  Current Outpatient Medications:    aspirin EC 81 MG tablet, Take 1 tablet (81 mg total) by mouth daily. Swallow whole., Disp: 30 tablet, Rfl: 0   brimonidine (ALPHAGAN) 0.2 % ophthalmic solution, Place 1 drop into both eyes in the morning and at bedtime., Disp: , Rfl:    carvedilol (COREG) 3.125 MG tablet, Take 1 tablet (3.125 mg total) by mouth 2 (two) times daily., Disp: 60 tablet, Rfl: 4   cetirizine (ZYRTEC) 10 MG tablet, Take 10 mg by mouth every morning., Disp: , Rfl:    Cholecalciferol (VITAMIN D) 2000 UNITS CAPS, Take 2,000 Units by mouth daily., Disp: , Rfl:    clopidogrel (PLAVIX) 75 MG tablet, Take 1 tablet (75 mg total) by mouth daily., Disp: 90 tablet, Rfl: 1   Cyanocobalamin (VITAMIN B-12) 5000 MCG TBDP, Take 10,000 mcg by mouth every morning., Disp: , Rfl:    dorzolamide-timolol (COSOPT) 22.3-6.8 MG/ML ophthalmic solution, Place 1 drop into the left eye 2 (two) times daily., Disp: , Rfl:    ketoconazole (NIZORAL) 2 % cream, Apply 1 application  topically daily as needed for irritation., Disp: , Rfl:    latanoprost (XALATAN) 0.005 % ophthalmic solution, Place 1 drop into both eyes at bedtime., Disp: , Rfl:    lidocaine (XYLOCAINE) 5 % ointment, Apply 1 application topically 2 (two) times daily as needed. (Patient taking differently: Apply 1 application  topically 2 (two) times daily as needed (neuropathy).), Disp: 35.44 g, Rfl: 3   MAGNESIUM PO, Take 400 mg by  mouth daily., Disp: , Rfl:    Multiple Vitamins-Minerals (CENTRUM) tablet, Take 1 tablet by mouth daily. 50 +, Disp: , Rfl:    nitroGLYCERIN (NITROSTAT) 0.4 MG SL tablet, Place 1 tablet (0.4 mg total) under the tongue every 5 (five) minutes as needed., Disp: 25 tablet, Rfl: 2   Omega-3 Fatty Acids (FISH OIL) 1200 MG CAPS, Take 3,600 mg by mouth daily. 3, Disp: , Rfl:    pantoprazole (PROTONIX) 40 MG tablet, Take 40 mg by mouth daily. , Disp: , Rfl:    pregabalin (LYRICA) 100 MG capsule, One in the am, one at noon and 2 tabs at night, Disp: 120 capsule, Rfl: 5   rOPINIRole (REQUIP) 2 MG tablet, Take 1 tablet at 5 Pm and 1/2 tablet at bedtime, Disp: 135 tablet, Rfl: 3   rosuvastatin (CRESTOR) 40 MG tablet, Take 1 tablet (40 mg total) by mouth daily., Disp: 90 tablet, Rfl: 1   sildenafil (REVATIO) 20 MG tablet, Take 20-100 mg by mouth daily as needed (ED)., Disp: , Rfl:   Past Medical History: Past Medical History:  Diagnosis Date   Arthritis    Cataract    removed bilaterally    Coronary artery disease    Erectile dysfunction    Esophageal reflux    Glaucoma    Hypertriglyceridemia    Impaired fasting glucose    MGUS (monoclonal gammopathy of unknown significance)    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  Nocturnal leg cramps 03/11/2021   Nontoxic uninodular goiter    Obesity    Peripheral neuropathy    RLS (restless legs syndrome)    Spinal stenosis    Unspecified deficiency anemia 07/11/2013    Tobacco Use: Social History   Tobacco Use  Smoking Status Former   Types: Cigarettes   Quit date: 06/01/1984   Years since quitting: 37.7  Smokeless Tobacco Never    Labs: Review Flowsheet       Latest Ref Rng & Units 04/15/2013  Labs for ITP Cardiac and Pulmonary Rehab  Hemoglobin A1c <5.7 % 5.2     Capillary Blood Glucose: Lab Results  Component Value Date   GLUCAP 112 (H) 04/18/2013   GLUCAP 102 (H) 04/18/2013   GLUCAP 111 (H) 04/17/2013   GLUCAP 119 (H)  04/17/2013   GLUCAP 106 (H) 04/17/2013     Exercise Target Goals: Exercise Program Goal: Individual exercise prescription set using results from initial 6 min walk test and THRR while considering  patient's activity barriers and safety.   Exercise Prescription Goal: Initial exercise prescription builds to 30-45 minutes a day of aerobic activity, 2-3 days per week.  Home exercise guidelines will be given to patient during program as part of exercise prescription that the participant will acknowledge.  Activity Barriers & Risk Stratification:  Activity Barriers & Cardiac Risk Stratification - 01/06/22 0903       Activity Barriers & Cardiac Risk Stratification   Activity Barriers Arthritis;Back Problems;History of Falls;Balance Concerns;Other (comment)    Comments Neuropathy in both feet, "bad" left knee, gets injections; physical therapy for recent left hand injury; hx crushed vertebrae, back pain, gets injections.    Cardiac Risk Stratification Low             6 Minute Walk:  6 Minute Walk     Row Name 01/06/22 0914         6 Minute Walk   Phase Initial     Distance 1766 feet     Walk Time 6 minutes     # of Rest Breaks 0     MPH 3.34     METS 3.4     RPE 12     Perceived Dyspnea  1     VO2 Peak 11.91     Symptoms Yes (comment)     Comments Patient c/o lower, left leg pain, 4/10 on the pain scale; mild SOB, RPD=1.     Resting HR 71 bpm     Resting BP 133/70     Resting Oxygen Saturation  98 %     Exercise Oxygen Saturation  during 6 min walk 95 %     Max Ex. HR 104 bpm     Max Ex. BP 178/60     2 Minute Post BP 166/80              Oxygen Initial Assessment:   Oxygen Re-Evaluation:   Oxygen Discharge (Final Oxygen Re-Evaluation):   Initial Exercise Prescription:  Initial Exercise Prescription - 01/06/22 0900       Date of Initial Exercise RX and Referring Provider   Date 01/06/22    Referring Provider Minus Breeding, MD    Expected Discharge  Date 03/13/22      Recumbant Bike   Level 1.5    Minutes 15    METs 2      NuStep   Level 2    SPM 85    Minutes 15  METs 2      Prescription Details   Frequency (times per week) 3    Duration Progress to 30 minutes of continuous aerobic without signs/symptoms of physical distress      Intensity   THRR 40-80% of Max Heartrate 58-116    Ratings of Perceived Exertion 11-13    Perceived Dyspnea 0-4      Progression   Progression Continue to progress workloads to maintain intensity without signs/symptoms of physical distress.      Resistance Training   Training Prescription Yes    Weight 3 lbs    Reps 10-15             Perform Capillary Blood Glucose checks as needed.  Exercise Prescription Changes:   Exercise Prescription Changes     Row Name 01/12/22 [phone] 01/28/22 0823 02/04/22 0936         Response to Exercise   Blood Pressure (Admit) 128/64 122/62 124/70     Blood Pressure (Exercise) 140/64 138/68 130/64     Blood Pressure (Exit) 114/68 128/64 106/62     Heart Rate (Admit) 66 bpm 64 bpm 68 bpm     Heart Rate (Exercise) 82 bpm 86 bpm 91 bpm     Heart Rate (Exit) 63 bpm 64 bpm 69 bpm     Rating of Perceived Exertion (Exercise) 10.5 11.5 11.5     Perceived Dyspnea (Exercise) 0 0 0     Symptoms 4/10 chronic L knee pain Mild low back pain None     Comments First day in CRP2 program Reviewed MET's, goals and home ExRx Reviewed MET's,     Duration Progress to 30 minutes of  aerobic without signs/symptoms of physical distress Progress to 30 minutes of  aerobic without signs/symptoms of physical distress Progress to 30 minutes of  aerobic without signs/symptoms of physical distress     Intensity THRR unchanged THRR unchanged THRR unchanged       Progression   Progression Continue to progress workloads to maintain intensity without signs/symptoms of physical distress. Continue to progress workloads to maintain intensity without signs/symptoms of physical distress.  Continue to progress workloads to maintain intensity without signs/symptoms of physical distress.     Average METs 1.7 2.05 2.45       Resistance Training   Training Prescription Yes No No     Weight 3 lbs -- --     Reps 10-15 -- --     Time 10 Minutes -- --       Recumbant Bike   Level 1.$Remove'5 2 2     'fIWhkAC$ Minutes $Remov'15 15 15     'vHPXcG$ METs 1.6 1.7 2       NuStep   Level 2 2.4 4     SPM 85 85 85     Minutes $Remove'15 15 15     'LMcoBlw$ METs 1.8 2.4 2.9       Home Exercise Plan   Plans to continue exercise at -- Home (comment) Home (comment)     Frequency -- Add 1 additional day to program exercise sessions. Add 1 additional day to program exercise sessions.     Initial Home Exercises Provided -- 01/28/22 01/28/22              Exercise Comments:   Exercise Comments     Row Name 01/28/22 0829 02/04/22 0939         Exercise Comments Reviewed MET's, goals and home ExRx. Pt tolerated exercise well with an average MET  level of 2.05. Pt feels good about his wt loss goal and will continue to work on it. Pt will continue to exercise on his own by walking, treadmill and swimming 1-2 days a week. Pt is also very active in his normal lifestyle and has been working in the yard often Reviewed MET's. Pt tolerated exercise well with an average MET level of 2.45. Overall pt is feeling good and plans to add resistance to the recubent bike on friday.               Exercise Goals and Review:   Exercise Goals     Row Name 01/06/22 0846             Exercise Goals   Increase Physical Activity Yes       Intervention Provide advice, education, support and counseling about physical activity/exercise needs.;Develop an individualized exercise prescription for aerobic and resistive training based on initial evaluation findings, risk stratification, comorbidities and participant's personal goals.       Expected Outcomes Short Term: Attend rehab on a regular basis to increase amount of physical activity.;Long Term:  Exercising regularly at least 3-5 days a week.;Long Term: Add in home exercise to make exercise part of routine and to increase amount of physical activity.       Increase Strength and Stamina Yes       Intervention Provide advice, education, support and counseling about physical activity/exercise needs.;Develop an individualized exercise prescription for aerobic and resistive training based on initial evaluation findings, risk stratification, comorbidities and participant's personal goals.       Expected Outcomes Short Term: Increase workloads from initial exercise prescription for resistance, speed, and METs.;Short Term: Perform resistance training exercises routinely during rehab and add in resistance training at home;Long Term: Improve cardiorespiratory fitness, muscular endurance and strength as measured by increased METs and functional capacity (6MWT)       Able to understand and use rate of perceived exertion (RPE) scale Yes       Intervention Provide education and explanation on how to use RPE scale       Expected Outcomes Short Term: Able to use RPE daily in rehab to express subjective intensity level;Long Term:  Able to use RPE to guide intensity level when exercising independently       Knowledge and understanding of Target Heart Rate Range (THRR) Yes       Intervention Provide education and explanation of THRR including how the numbers were predicted and where they are located for reference       Expected Outcomes Short Term: Able to state/look up THRR;Long Term: Able to use THRR to govern intensity when exercising independently;Short Term: Able to use daily as guideline for intensity in rehab       Able to check pulse independently Yes       Intervention Provide education and demonstration on how to check pulse in carotid and radial arteries.;Review the importance of being able to check your own pulse for safety during independent exercise       Expected Outcomes Short Term: Able to explain  why pulse checking is important during independent exercise;Long Term: Able to check pulse independently and accurately       Understanding of Exercise Prescription Yes       Intervention Provide education, explanation, and written materials on patient's individual exercise prescription       Expected Outcomes Short Term: Able to explain program exercise prescription;Long Term: Able to explain home exercise prescription  to exercise independently                Exercise Goals Re-Evaluation :  Exercise Goals Re-Evaluation     Row Name 01/12/22 0850 01/28/22 0826 02/04/22 0938         Exercise Goal Re-Evaluation   Exercise Goals Review Increase Physical Activity;Increase Strength and Stamina;Able to understand and use rate of perceived exertion (RPE) scale;Knowledge and understanding of Target Heart Rate Range (THRR);Understanding of Exercise Prescription Increase Physical Activity;Increase Strength and Stamina;Able to understand and use rate of perceived exertion (RPE) scale;Knowledge and understanding of Target Heart Rate Range (THRR);Understanding of Exercise Prescription Increase Physical Activity;Increase Strength and Stamina;Able to understand and use rate of perceived exertion (RPE) scale;Knowledge and understanding of Target Heart Rate Range (THRR);Understanding of Exercise Prescription     Comments Pt first day in theCRP2 program. Pt tolerated exercise well with 4/10 chronic left knee pain and an average MET level of 1.7. Pt is learning his THRR, RPE and ExRx. Reviewed MET's, goals and home ExRx. Pt tolerated exercise well with an average MET level of 2.05. Pt feels good about his wt loss goal and will continue to work on it. Pt will continue to exercise on his own by walking, treadmill and swimming 1-2 days a week. Pt is also very active in his normal lifestyle and has been working in the yard often Reviewed MET's. Pt tolerated exercise well with an average MET level of 2.45. Overall pt is  feeling good and plans to add resistance to the recubent bike on friday.     Expected Outcomes Will continue to monitor pt and progress workloads as tolerated without sign or symptom Pt will continue to exercise on his own. Will continue to monitor pt and progress workloads as tolerated without sign or symptom Will continue to monitor pt and progress workloads as tolerated without sign or symptom              Discharge Exercise Prescription (Final Exercise Prescription Changes):  Exercise Prescription Changes - 02/04/22 0936       Response to Exercise   Blood Pressure (Admit) 124/70    Blood Pressure (Exercise) 130/64    Blood Pressure (Exit) 106/62    Heart Rate (Admit) 68 bpm    Heart Rate (Exercise) 91 bpm    Heart Rate (Exit) 69 bpm    Rating of Perceived Exertion (Exercise) 11.5    Perceived Dyspnea (Exercise) 0    Symptoms None    Comments Reviewed MET's,    Duration Progress to 30 minutes of  aerobic without signs/symptoms of physical distress    Intensity THRR unchanged      Progression   Progression Continue to progress workloads to maintain intensity without signs/symptoms of physical distress.    Average METs 2.45      Resistance Training   Training Prescription No      Recumbant Bike   Level 2    Minutes 15    METs 2      NuStep   Level 4    SPM 85    Minutes 15    METs 2.9      Home Exercise Plan   Plans to continue exercise at Home (comment)    Frequency Add 1 additional day to program exercise sessions.    Initial Home Exercises Provided 01/28/22             Nutrition:  Target Goals: Understanding of nutrition guidelines, daily intake of sodium '1500mg'$ , cholesterol <  $'200mg'h$ , calories 30% from fat and 7% or less from saturated fats, daily to have 5 or more servings of fruits and vegetables.  Biometrics:  Pre Biometrics - 01/06/22 0834       Pre Biometrics   Waist Circumference 48 inches    Hip Circumference 47 inches    Waist to Hip Ratio  1.02 %    Triceps Skinfold 20 mm    % Body Fat 35.1 %    Grip Strength 34 kg    Flexibility --   Not performed, issues with back pain.   Single Leg Stand 1.5 seconds              Nutrition Therapy Plan and Nutrition Goals:  Nutrition Therapy & Goals - 02/11/22 1646       Nutrition Therapy   Diet Heart Healthy Diet    Drug/Food Interactions Statins/Certain Fruits      Personal Nutrition Goals   Nutrition Goal Patient to choose a daily variety of fruits, vegetables, whole grains, lean protein, nonfat/low fat dairy as part of heart healthy lifestyle    Personal Goal #2 Patient to limit sodium intake to '1500mg'$  daily    Personal Goal #3 Patient to identify and limit daily intake of saturated fat, trans fat, sodium, and refined carbohydrates    Comments Goals in progress. Patient continues to attend the [employer] education classes. He is down 6.6# since starting with our program.      Intervention Plan   Intervention Prescribe, educate and counsel regarding individualized specific dietary modifications aiming towards targeted core components such as weight, hypertension, lipid management, diabetes, heart failure and other comorbidities.;Nutrition handout(s) given to patient.    Expected Outcomes Short Term Goal: Understand basic principles of dietary content, such as calories, fat, sodium, cholesterol and nutrients.;Long Term Goal: Adherence to prescribed nutrition plan.             Nutrition Assessments:  MEDIFICTS Score Key: ?70 Need to make dietary changes  40-70 Heart Healthy Diet ? 40 Therapeutic Level Cholesterol Diet    Picture Your Plate Scores: <14 Unhealthy dietary pattern with much room for improvement. 41-50 Dietary pattern unlikely to meet recommendations for good health and room for improvement. 51-60 More healthful dietary pattern, with some room for improvement.  >60 Healthy dietary pattern, although there may be some specific behaviors that could be  improved.    Nutrition Goals Re-Evaluation:  Nutrition Goals Re-Evaluation     [place] Name 01/12/22 1630 02/11/22 1646           Goals   Current Weight 255 lb 15.3 oz (116.1 kg) 251 lb 8.7 oz (114.1 kg)      Comment A1c normal, LDL 112 no new labs at this time.      Expected Outcome -- Goals in progress. Patient continues to attend the [employer] education classes. He is down 6.6# since starting with our program.               Nutrition Goals Re-Evaluation:  Nutrition Goals Re-Evaluation     [place] Name 01/12/22 1630 02/11/22 1646           Goals   Current Weight 255 lb 15.3 oz (116.1 kg) 251 lb 8.7 oz (114.1 kg)      Comment A1c normal, LDL 112 no new labs at this time.      Expected Outcome -- Goals in progress. Patient continues to attend the [employer] education classes. He is down 6.6# since starting with our program.  Nutrition Goals Discharge (Final Nutrition Goals Re-Evaluation):  Nutrition Goals Re-Evaluation - 02/11/22 1646       Goals   Current Weight 251 lb 8.7 oz (114.1 kg)    Comment no new labs at this time.    Expected Outcome Goals in progress. Patient continues to attend the [employer] education classes. He is down 6.6# since starting with our program.             Psychosocial: Target Goals: Acknowledge presence or absence of significant depression and/or stress, maximize coping skills, provide positive support system. Participant is able to verbalize types and ability to use techniques and skills needed for reducing stress and depression.  Initial Review & Psychosocial Screening:  Initial Psych Review & Screening - 01/06/22 1031       Initial Review   Current issues with None Identified      Family Dynamics   Good Support System? Yes    Comments Patient reports good support system with his wife, 2 children and his grandchildren.      Barriers   Psychosocial barriers to participate in program There are no identifiable barriers  or psychosocial needs.             Quality of Life Scores:  Quality of Life - 01/06/22 1012       Quality of Life   Select Quality of Life      Quality of Life Scores   Health/Function Pre 24.4 %    Socioeconomic Pre 28.29 %    Psych/Spiritual Pre 30 %    Family Pre 28.8 %    GLOBAL Pre 27 %            Scores of 19 and below usually indicate a poorer quality of life in these areas.  A difference of  2-3 points is a clinically meaningful difference.  A difference of 2-3 points in the total score of the Quality of Life Index has been associated with significant improvement in overall quality of life, self-image, physical symptoms, and general health in studies assessing change in quality of life.  PHQ-9: Review Flowsheet       01/06/2022 01/22/2014  Depression screen PHQ 2/9  Decreased Interest 0 0  Down, Depressed, Hopeless 0 0  PHQ - 2 Score 0 0   Interpretation of Total Score  Total Score Depression Severity:  1-4 = Minimal depression, 5-9 = Mild depression, 10-14 = Moderate depression, 15-19 = Moderately severe depression, 20-27 = Severe depression   Psychosocial Evaluation and Intervention:  Psychosocial Evaluation - 01/21/22 1224       Psychosocial Evaluation & Interventions   Interventions Stress management education;Relaxation education;Encouraged to exercise with the program and follow exercise prescription    Comments Graeme feels supported by his family  in his efforts to adopt a heart healthy lifestyle.  No identified barriers to participating in cardiac rehab    Expected Outcomes Royal will continue to be supported by his family and no reportable barriers to participating in cardiac rehab    Continue Psychosocial Services  No Follow up required             Psychosocial Re-Evaluation:  Psychosocial Re-Evaluation     [place] Name 02/17/22 0951             Psychosocial Re-Evaluation   Current issues with None Identified       Comments Karlis  is pleased with the progress he has made thus far particular with weight loss. Continues to feel  supported by his family in his efforts to asopt a heart healthy lifestyle particular with the nutrition aspect.       Expected Outcomes Yussuf will continue to display a hopeful and positive outlook on life.       Interventions Encouraged to attend Cardiac Rehabilitation for the exercise;Stress management education       Continue Psychosocial Services  No Follow up required                Psychosocial Discharge (Final Psychosocial Re-Evaluation):  Psychosocial Re-Evaluation - 02/17/22 0951       Psychosocial Re-Evaluation   Current issues with None Identified    Comments Lora is pleased with the progress he has made thus far particular with weight loss. Continues to feel supported by his family in his efforts to asopt a heart healthy lifestyle particular with the nutrition aspect.    Expected Outcomes Joushua will continue to display a hopeful and positive outlook on life.    Interventions Encouraged to attend Cardiac Rehabilitation for the exercise;Stress management education    Continue Psychosocial Services  No Follow up required             Vocational Rehabilitation: Provide vocational rehab assistance to qualifying candidates.   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 Participant will state understanding/return demonstration of topics presented.    Education     Row Name 01/12/22 0900     Education   Cardiac Education Topics [employer]   [employer] Workshops     Workshops   Educator  Dietitian   [facility] Nutrition   Nutrition Workshop Fueling a Designer, multimedia   Instruction Review Code 1- Verbalizes Understanding   Class Start Time 0815   Class Stop Time 0904   Class Time Calculation (min) 49 min    [place] Name 01/14/22 0900     Education   Cardiac Education Topics [employer]   Educator Dietitian   Weekly Topic Adding Flavor - Sodium-Free   Instruction Review Code 1- Verbalizes Understanding   Class Start Time 0815   Class Stop Time 2811   Class Time Calculation (min) 42 min    [place] Name 01/16/22 0800     Education   Cardiac Education Topics [employer]   Select Core Videos     Core Videos   Educator Nurse   Select General Education   General Education Heart Disease Risk Reduction   Instruction Review Code 1- Verbalizes Understanding   Class Start Time 0815   Class Stop Time 8867   Class Time Calculation (min) 40 min    [place] Name 01/19/22 0900     Education   Cardiac Education Topics [employer]   [facility] Workshops     Workshops   Educator Exercise Physiologist   [facility] Psychosocial   Psychosocial Workshop Recognizing and Reducing Stress   Instruction Review Code 1- Verbalizes Understanding   Class Start Time 0810   Class Stop Time 0904   Class Time Calculation (min) 54 min    [place] Name 01/21/22  0900     Education   Cardiac Education Topics [employer]   Educator Dietitian   Weekly Topic Fast and Healthy Breakfasts   Instruction Review Code 1- Verbalizes Understanding   Class Start Time 0815   Class Stop Time 0905   Class Time Calculation (min) 50 min    [place] Name 01/23/22 0900     Education   Cardiac Education Topics Pritikin   Select Core Videos     Core Videos   Educator Dietitian   Select Nutrition   Nutrition Overview of the Pritikin Eating Plan   Instruction Review Code 1- Verbalizes Understanding   Class Start Time 0815   Class Stop Time 0900   Class  Time Calculation (min) 45 min    [place] Name 01/26/22 0900     Education   Cardiac Education Topics Pritikin   Select Workshops     Workshops   Educator Exercise Physiologist   Select Exercise   Exercise Workshop Hotel manager and Fall Prevention   Instruction Review Code 1- Verbalizes Understanding   Class Start Time [phone]   Class Stop Time 0900   Class Time Calculation (min) 50 min    [place] Name 01/28/22 1000     Education   Cardiac Education Topics [employer]   Educator Dietitian   Weekly Topic Personalizing Your Pritikin Plate   Instruction Review Code 1- Verbalizes Understanding   Class Start Time 0815   Class Stop Time 0900   Class Time Calculation (min) 45 min    [place] Name 01/30/22 0900     Education   Cardiac Education Topics Pritikin     Core Videos   Educator Nurse   Select Psychosocial   Psychosocial Healthy Minds, Bodies, Hearts   Instruction Review Code 1- Verbalizes Understanding   Class Start Time 0818  0818   Class Stop Time 0853   Class Time Calculation (min) 35 min    [place] Name 02/04/22 0900     Education   Cardiac Education Topics [employer]   Educator Dietitian   Weekly Topic Tasty Appetizers and Snacks   Instruction Review Code 1- Verbalizes Understanding   Class Start Time [phone]   Class Stop Time 0850   Class Time Calculation (min) 40 min    [place] Name 02/06/22 0900     Education   Cardiac Education Topics Pritikin   Select Core Videos     Core Videos   Educator Dietitian   Select Nutrition   Nutrition Other  Label Reading   Instruction Review Code 1- Verbalizes Understanding   Class Start Time [phone]   Class Stop Time 0856   Class Time Calculation (min) 44 min    [place] Name 02/09/22 1000     Education   Cardiac Education Topics Pritikin   Select Core Videos     Core Videos   Educator Dietitian   Select Nutrition   Nutrition Calorie Density    Instruction Review Code 1- Verbalizes Understanding   Class Start Time 0810   Class Stop Time 0856   Class Time Calculation (min) 46 min    [place] Name 02/13/22 0900     Education   Cardiac Education Topics Pritikin   Environmental consultant Psychosocial   Psychosocial  Workshop Managing Moods and Relationships   Instruction Review Code 1- Verbalizes Understanding   Class Start Time 0810   Class Stop Time 0851   Class Time Calculation (min) 41 min    [place] Name 02/16/22 0900     Education   Cardiac Education Topics [place]   Select Workshops     Workshops   Educator Exercise Physiologist   Select Exercise   Exercise Workshop Exercise Basics: Building Your Action Plan   Instruction Review Code 1- Verbalizes Understanding   Class Start Time [phone]   Class Stop Time 0855   Class Time Calculation (min) 43 min            Core Videos: Exercise    Move It!  Clinical staff conducted group or individual video education with verbal and written material and guidebook.  Patient learns the recommended Pritikin exercise program. Exercise with the goal of living a long, healthy life. Some of the health benefits of exercise include controlled diabetes, healthier blood pressure levels, improved cholesterol levels, improved heart and lung capacity, improved sleep, and better body composition. Everyone should speak with their doctor before starting or changing an exercise routine.  Biomechanical Limitations Clinical staff conducted group or individual video education with verbal and written material and guidebook.  Patient learns how biomechanical limitations can impact exercise and how we can mitigate and possibly overcome limitations to have an impactful and balanced exercise routine.  Body Composition Clinical staff conducted group or individual video education with verbal and written material and guidebook.  Patient learns that body  composition (ratio of muscle mass to fat mass) is a key component to assessing overall fitness, rather than body weight alone. Increased fat mass, especially visceral belly fat, can put [place] at increased risk for metabolic syndrome, type 2 diabetes, heart disease, and even death. It is recommended to combine diet and exercise (cardiovascular and resistance training) to improve your body composition. Seek guidance from your physician and exercise physiologist before implementing an exercise routine.  Exercise Action Plan Clinical staff conducted group or individual video education with verbal and written material and guidebook.  Patient learns the recommended strategies to achieve and enjoy long-term exercise adherence, including variety, self-motivation, self-efficacy, and positive decision making. Benefits of exercise include fitness, good health, weight management, more energy, better sleep, less stress, and overall well-being.  Medical   Heart Disease Risk Reduction Clinical staff conducted group or individual video education with verbal and written material and guidebook.  Patient learns our heart is our most vital organ as it circulates oxygen, nutrients, white blood cells, and hormones throughout the entire body, and carries waste away. Data supports a plant-based eating plan like the Pritikin Program for its effectiveness in slowing progression of and reversing heart disease. The video provides a number of recommendations to address heart disease.   Metabolic Syndrome and Belly Fat  Clinical staff conducted group or individual video education with verbal and written material and guidebook.  Patient learns what metabolic syndrome is, how it leads to heart disease, and how one can reverse it and keep it from coming back. You have metabolic syndrome if you have 3 of the following 5 criteria: abdominal obesity, high blood pressure, high triglycerides, low HDL cholesterol, and high blood  sugar.  Hypertension and Heart Disease Clinical staff conducted group or individual video education with verbal and written material and guidebook.  Patient learns that high blood pressure, or hypertension, is very common in the [place]. Hypertension is largely  due to excessive salt intake, but other important risk factors include being overweight, physical inactivity, drinking too much alcohol, smoking, and not eating enough potassium from fruits and vegetables. High blood pressure is a leading risk factor for heart attack, stroke, congestive heart failure, dementia, kidney failure, and premature death. Long-term effects of excessive salt intake include stiffening of the arteries and thickening of heart muscle and organ damage. Recommendations include ways to reduce hypertension and the risk of heart disease.  Diseases of Our Time - Focusing on Diabetes Clinical staff conducted group or individual video education with verbal and written material and guidebook.  Patient learns why the best way to stop diseases of our time is prevention, through food and other lifestyle changes. Medicine (such as prescription pills and surgeries) is often only a Band-Aid on the problem, not a long-term solution. Most common diseases of our time include obesity, type 2 diabetes, hypertension, heart disease, and cancer. The Pritikin Program is recommended and has been proven to help reduce, reverse, and/or prevent the damaging effects of metabolic syndrome.  Nutrition   Overview of the Pritikin Eating Plan  Clinical staff conducted group or individual video education with verbal and written material and guidebook.  Patient learns about the [place] for disease risk reduction. The [place] emphasizes a wide variety of unrefined, minimally-processed carbohydrates, like fruits, vegetables, whole grains, and legumes. Go, Caution, and Stop food choices are explained. Plant-based and lean animal  proteins are emphasized. Rationale provided for low sodium intake for blood pressure control, low added sugars for blood sugar stabilization, and low added fats and oils for coronary artery disease risk reduction and weight management.  Calorie Density  Clinical staff conducted group or individual video education with verbal and written material and guidebook.  Patient learns about calorie density and how it impacts the Pritikin Eating Plan. Knowing the characteristics of the food you choose will help you decide whether those foods will lead to weight gain or weight loss, and whether you want to consume more or less of them. Weight loss is usually a side effect of the Pritikin Eating Plan because of its focus on low calorie-dense foods.  Label Reading  Clinical staff conducted group or individual video education with verbal and written material and guidebook.  Patient learns about the Pritikin recommended label reading guidelines and corresponding recommendations regarding calorie density, added sugars, sodium content, and whole grains.  Dining Out - Part 1  Clinical staff conducted group or individual video education with verbal and written material and guidebook.  Patient learns that restaurant meals can be sabotaging because they can be so high in calories, fat, sodium, and/or sugar. Patient learns recommended strategies on how to positively address this and avoid unhealthy pitfalls.  Facts on Fats  Clinical staff conducted group or individual video education with verbal and written material and guidebook.  Patient learns that lifestyle modifications can be just as effective, if not more so, as many medications for lowering your risk of heart disease. A Pritikin lifestyle can help to reduce your risk of inflammation and atherosclerosis (cholesterol build-up, or plaque, in the artery walls). Lifestyle interventions such as dietary choices and physical activity address the cause of atherosclerosis.  A review of the types of fats and their impact on blood cholesterol levels, along with dietary recommendations to reduce fat intake is also included.  Nutrition Action Plan  Clinical staff conducted group or individual video education with verbal and written material and guidebook.  Patient learns how to incorporate Pritikin recommendations into their lifestyle. Recommendations include planning and keeping personal health goals in mind as an important part of their success.  Healthy Mind-Set    Healthy Minds, Bodies, Hearts  Clinical staff conducted group or individual video education with verbal and written material and guidebook.  Patient learns how to identify when they are stressed. Video will discuss the impact of that stress, as well as the many benefits of stress management. Patient will also be introduced to stress management techniques. The way we think, act, and feel has an impact on our hearts.  How Our Thoughts Can Heal Our Hearts  Clinical staff conducted group or individual video education with verbal and written material and guidebook.  Patient learns that negative thoughts can cause depression and anxiety. This can result in negative lifestyle behavior and serious health problems. Cognitive behavioral therapy is an effective method to help control our thoughts in order to change and improve our emotional outlook.  Additional Videos:  Exercise    Improving Performance  Clinical staff conducted group or individual video education with verbal and written material and guidebook.  Patient learns to use a non-linear approach by alternating intensity levels and lengths of time spent exercising to help burn more calories and lose more body fat. Cardiovascular exercise helps improve heart health, metabolism, hormonal balance, blood sugar control, and recovery from fatigue. Resistance training improves strength, endurance, balance, coordination, reaction time, metabolism, and muscle mass.  Flexibility exercise improves circulation, posture, and balance. Seek guidance from your physician and exercise physiologist before implementing an exercise routine and learn your capabilities and proper form for all exercise.  Introduction to Yoga  Clinical staff conducted group or individual video education with verbal and written material and guidebook.  Patient learns about yoga, a discipline of the coming together of mind, breath, and body. The benefits of yoga include improved flexibility, improved range of motion, better posture and core strength, increased lung function, weight loss, and positive self-image. Yoga's heart health benefits include lowered blood pressure, healthier heart rate, decreased cholesterol and triglyceride levels, improved immune function, and reduced stress. Seek guidance from your physician and exercise physiologist before implementing an exercise routine and learn your capabilities and proper form for all exercise.  Medical   Aging: Enhancing Your Quality of Life  Clinical staff conducted group or individual video education with verbal and written material and guidebook.  Patient learns key strategies and recommendations to stay in good physical health and enhance quality of life, such as prevention strategies, having an advocate, securing a [place], and keeping a list of medications and system for tracking them. It also discusses how to avoid risk for bone loss.  Biology of Weight Control  Clinical staff conducted group or individual video education with verbal and written material and guidebook.  Patient learns that weight gain occurs because we consume more calories than we burn (eating more, moving less). Even if your body weight is normal, you may have higher ratios of fat compared to muscle mass. Too much body fat puts you at increased risk for cardiovascular disease, heart attack, stroke, type 2 diabetes, and obesity-related  cancers. In addition to exercise, following the [place] can help reduce your risk.  Decoding Lab Results  Clinical staff conducted group or individual video education with verbal and written material and guidebook.  Patient learns that lab test reflects one measurement whose values change over time and are influenced  by many factors, including medication, stress, sleep, exercise, food, hydration, pre-existing medical conditions, and more. It is recommended to use the knowledge from this video to become more involved with your lab results and evaluate your numbers to speak with your doctor.   Diseases of Our Time - Overview  Clinical staff conducted group or individual video education with verbal and written material and guidebook.  Patient learns that according to the CDC, 50% to 70% of chronic diseases (such as obesity, type 2 diabetes, elevated lipids, hypertension, and heart disease) are avoidable through lifestyle improvements including healthier food choices, listening to satiety cues, and increased physical activity.  Sleep Disorders Clinical staff conducted group or individual video education with verbal and written material and guidebook.  Patient learns how good quality and duration of sleep are important to overall health and well-being. Patient also learns about sleep disorders and how they impact health along with recommendations to address them, including discussing with a physician.  Nutrition  Dining Out - Part 2 Clinical staff conducted group or individual video education with verbal and written material and guidebook.  Patient learns how to plan ahead and communicate in order to maximize their dining experience in a healthy and nutritious manner. Included are recommended food choices based on the type of restaurant the patient is visiting.   Fueling a Best boy conducted group or individual video education with verbal and written material and  guidebook.  There is a strong connection between our food choices and our health. Diseases like obesity and type 2 diabetes are very prevalent and are in large-part due to lifestyle choices. The Pritikin Eating Plan provides plenty of food and hunger-curbing satisfaction. It is easy to follow, affordable, and helps reduce health risks.  Menu Workshop  Clinical staff conducted group or individual video education with verbal and written material and guidebook.  Patient learns that restaurant meals can sabotage health goals because they are often packed with calories, fat, sodium, and sugar. Recommendations include strategies to plan ahead and to communicate with the manager, chef, or server to help order a healthier meal.  Planning Your Eating Strategy  Clinical staff conducted group or individual video education with verbal and written material and guidebook.  Patient learns about the [place] and its benefit of reducing the risk of disease. The [place] does not focus on calories. Instead, it emphasizes high-quality, nutrient-rich foods. By knowing the characteristics of the foods, we choose, we can determine their calorie density and make informed decisions.  Targeting Your Nutrition Priorities  Clinical staff conducted group or individual video education with verbal and written material and guidebook.  Patient learns that lifestyle habits have a tremendous impact on disease risk and progression. This video provides eating and physical activity recommendations based on your personal health goals, such as reducing LDL cholesterol, losing weight, preventing or controlling type 2 diabetes, and reducing high blood pressure.  Vitamins and Minerals  Clinical staff conducted group or individual video education with verbal and written material and guidebook.  Patient learns different ways to obtain key vitamins and minerals, including through a recommended healthy diet. It is  important to discuss all supplements you take with your doctor.   Healthy Mind-Set    Smoking Cessation  Clinical staff conducted group or individual video education with verbal and written material and guidebook.  Patient learns that cigarette smoking and tobacco addiction pose a serious health risk which affects millions of people. Stopping smoking will  significantly reduce the risk of heart disease, lung disease, and many forms of cancer. Recommended strategies for quitting are covered, including working with your doctor to develop a successful plan.  Culinary   Becoming a Financial trader conducted group or individual video education with verbal and written material and guidebook.  Patient learns that cooking at home can be healthy, cost-effective, quick, and puts them in control. Keys to cooking healthy recipes will include looking at your recipe, assessing your equipment needs, planning ahead, making it simple, choosing cost-effective seasonal ingredients, and limiting the use of added fats, salts, and sugars.  Cooking - Breakfast and Snacks  Clinical staff conducted group or individual video education with verbal and written material and guidebook.  Patient learns how important breakfast is to satiety and nutrition through the entire day. Recommendations include key foods to eat during breakfast to help stabilize blood sugar levels and to prevent overeating at meals later in the day. Planning ahead is also a key component.  Cooking - Human resources officer conducted group or individual video education with verbal and written material and guidebook.  Patient learns eating strategies to improve overall health, including an approach to cook more at home. Recommendations include thinking of animal protein as a side on your plate rather than center stage and focusing instead on lower calorie dense options like vegetables, fruits, whole grains, and plant-based proteins,  such as beans. Making sauces in large quantities to freeze for later and leaving the skin on your vegetables are also recommended to maximize your experience.  Cooking - Healthy Salads and Dressing Clinical staff conducted group or individual video education with verbal and written material and guidebook.  Patient learns that vegetables, fruits, whole grains, and legumes are the foundations of the [place]. Recommendations include how to incorporate each of these in flavorful and healthy salads, and how to create homemade salad dressings. Proper handling of ingredients is also covered. Cooking - Soups and [employer] - Soups and Desserts Clinical staff conducted group or individual video education with verbal and written material and guidebook.  Patient learns that Pritikin soups and desserts make for easy, nutritious, and delicious snacks and meal components that are low in sodium, fat, sugar, and calorie density, while high in vitamins, minerals, and filling fiber. Recommendations include simple and healthy ideas for soups and desserts.   Overview     The Pritikin Solution Program Overview Clinical staff conducted group or individual video education with verbal and written material and guidebook.  Patient learns that the results of the [place] Program have been documented in more than 100 articles published in peer-reviewed journals, and the benefits include reducing risk factors for (and, in some cases, even reversing) high cholesterol, high blood pressure, type 2 diabetes, obesity, and more! An overview of the three key pillars of the Pritikin Program will be covered: eating well, doing regular exercise, and having a healthy mind-set.  WORKSHOPS  Exercise: Exercise Basics: Building Your Action Plan Clinical staff led group instruction and group discussion with PowerPoint presentation and patient guidebook. To enhance the learning environment the use of posters, models and  videos may be added. At the conclusion of this workshop, patients will comprehend the difference between physical activity and exercise, as well as the benefits of incorporating both, into their routine. Patients will understand the FITT (Frequency, Intensity, Time, and Type) principle and how to use it to build an exercise action plan. In addition, safety  concerns and other considerations for exercise and cardiac rehab will be addressed by the presenter. The purpose of this lesson is to promote a comprehensive and effective weekly exercise routine in order to improve patients' overall level of fitness.   Managing Heart Disease: Your Path to a Healthier Heart Clinical staff led group instruction and group discussion with PowerPoint presentation and patient guidebook. To enhance the learning environment the use of posters, models and videos may be added.At the conclusion of this workshop, patients will understand the anatomy and physiology of the heart. Additionally, they will understand how Pritikin's three pillars impact the risk factors, the progression, and the management of heart disease.  The purpose of this lesson is to provide a high-level overview of the heart, heart disease, and how the Pritikin lifestyle positively impacts risk factors.  Exercise Biomechanics Clinical staff led group instruction and group discussion with PowerPoint presentation and patient guidebook. To enhance the learning environment the use of posters, models and videos may be added. Patients will learn how the structural parts of their bodies function and how these functions impact their daily activities, movement, and exercise. Patients will learn how to promote a neutral spine, learn how to manage pain, and identify ways to improve their physical movement in order to promote healthy living. The purpose of this lesson is to expose patients to common physical limitations that impact physical activity. Participants  will learn practical ways to adapt and manage aches and pains, and to minimize their effect on regular exercise. Patients will learn how to maintain good posture while sitting, walking, and lifting.  Balance Training and Fall Prevention  Clinical staff led group instruction and group discussion with PowerPoint presentation and patient guidebook. To enhance the learning environment the use of posters, models and videos may be added. At the conclusion of this workshop, patients will understand the importance of their sensorimotor skills (vision, proprioception, and the vestibular system) in maintaining their ability to balance as they age. Patients will apply a variety of balancing exercises that are appropriate for their current level of function. Patients will understand the common causes for poor balance, possible solutions to these problems, and ways to modify their physical environment in order to minimize their fall risk. The purpose of this lesson is to teach patients about the importance of maintaining balance as they age and ways to minimize their risk of falling.  WORKSHOPS   Nutrition:  Fueling a Scientist, research (physical sciences) led group instruction and group discussion with PowerPoint presentation and patient guidebook. To enhance the learning environment the use of posters, models and videos may be added. Patients will review the foundational principles of the [place] and understand what constitutes a serving size in each of the food groups. Patients will also learn Pritikin-friendly foods that are better choices when away from home and review make-ahead meal and snack options. Calorie density will be reviewed and applied to three nutrition priorities: weight maintenance, weight loss, and weight gain. The purpose of this lesson is to reinforce (in a group setting) the key concepts around what patients are recommended to eat and how to apply these guidelines when away from home by  planning and selecting Pritikin-friendly options. Patients will understand how calorie density may be adjusted for different weight management goals.  Mindful Eating  Clinical staff led group instruction and group discussion with PowerPoint presentation and patient guidebook. To enhance the learning environment the use of posters, models and videos may be added. Patients will  briefly review the concepts of the [place] and the importance of low-calorie dense foods. The concept of mindful eating will be introduced as well as the importance of paying attention to internal hunger signals. Triggers for non-hunger eating and techniques for dealing with triggers will be explored. The purpose of this lesson is to provide patients with the opportunity to review the basic principles of the [place], discuss the value of eating mindfully and how to measure internal cues of hunger and fullness using the Hunger Scale. Patients will also discuss reasons for non-hunger eating and learn strategies to use for controlling emotional eating.  Targeting Your Nutrition Priorities Clinical staff led group instruction and group discussion with PowerPoint presentation and patient guidebook. To enhance the learning environment the use of posters, models and videos may be added. Patients will learn how to determine their genetic susceptibility to disease by reviewing their family history. Patients will gain insight into the importance of diet as part of an overall healthy lifestyle in mitigating the impact of genetics and other environmental insults. The purpose of this lesson is to provide patients with the opportunity to assess their personal nutrition priorities by looking at their family history, their own health history and current risk factors. Patients will also be able to discuss ways of prioritizing and modifying the [place] for their highest risk areas  Menu  Clinical staff led group  instruction and group discussion with PowerPoint presentation and patient guidebook. To enhance the learning environment the use of posters, models and videos may be added. Using menus brought in from [employer], or printed from [employer], patients will apply the [place] dining out guidelines that were presented in the [employer] video. Patients will also be able to practice these guidelines in a variety of provided scenarios. The purpose of this lesson is to provide patients with the opportunity to practice hands-on learning of the [place] with actual menus and practice scenarios.  Label Reading Clinical staff led group instruction and group discussion with PowerPoint presentation and patient guidebook. To enhance the learning environment the use of posters, models and videos may be added. Patients will review and discuss the Pritikin label reading guidelines presented in Pritikin's Label Reading Educational series video. Using fool labels brought in from local grocery stores and markets, patients will apply the label reading guidelines and determine if the packaged food meet the Pritikin guidelines. The purpose of this lesson is to provide patients with the opportunity to review, discuss, and practice hands-on learning of the Pritikin Label Reading guidelines with actual packaged food labels. [place] Workshops are designed to teach patients ways to prepare quick, simple, and affordable recipes at home. The importance of nutrition's role in chronic disease risk reduction is reflected in its emphasis in the overall Pritikin program. By learning how to prepare essential core Pritikin Eating Plan recipes, patients will increase control over what they eat; be able to customize the flavor of foods without the use of added salt, sugar, or fat; and improve the quality of the food they consume. By learning a set of core recipes  which are easily assembled, quickly prepared, and affordable, patients are more likely to prepare more healthy foods at home. These workshops focus on convenient breakfasts, simple entres, side dishes, and desserts which can be prepared with minimal effort and are consistent with nutrition recommendations for cardiovascular risk reduction. Cooking [employer] are taught by  a Engineer, materials (RD) who has been trained by the [employer] team. The chef or RD has a clear understanding of the importance of minimizing - if not completely eliminating - added fat, sugar, and sodium in recipes. Throughout the series of [place] Workshop sessions, patients will learn about healthy ingredients and efficient methods of cooking to build confidence in their capability to prepare    Cooking School weekly topics:  Adding Flavor- Sodium-Free  Fast and Healthy Breakfasts  Powerhouse Plant-Based Proteins  Satisfying Salads and Dressings  Simple Sides and Sauces  International Cuisine-Spotlight on the [place] Zones  Delicious Desserts  Savory Soups  Efficiency Cooking - Meals in a Snap  Tasty Appetizers and Snacks  Comforting Weekend Breakfasts  One-Pot Wonders   Fast Evening Meals  [place] (Psychosocial): New Thoughts, New Behaviors Clinical staff led group instruction and group discussion with PowerPoint presentation and patient guidebook. To enhance the learning environment the use of posters, models and videos may be added. Patients will learn and practice techniques for developing effective health and lifestyle goals. Patients will be able to effectively apply the goal setting process learned to develop at least one new personal goal.  The purpose of this lesson is to expose patients to a new skill set of behavior modification techniques such as techniques setting SMART goals, overcoming barriers, and  achieving new thoughts and new behaviors.  Managing Moods and Relationships Clinical staff led group instruction and group discussion with PowerPoint presentation and patient guidebook. To enhance the learning environment the use of posters, models and videos may be added. Patients will learn how emotional and chronic stress factors can impact their health and relationships. They will learn healthy ways to manage their moods and utilize positive coping mechanisms. In addition, ICR patients will learn ways to improve communication skills. The purpose of this lesson is to expose patients to ways of understanding how one's mood and health are intimately connected. Developing a healthy outlook can help build positive relationships and connections with others. Patients will understand the importance of utilizing effective communication skills that include actively listening and being heard. They will learn and understand the importance of the "4 Cs" and especially Connections in fostering of a Healthy Mind-Set.  Healthy Sleep for a Healthy Heart Clinical staff led group instruction and group discussion with PowerPoint presentation and patient guidebook. To enhance the learning environment the use of posters, models and videos may be added. At the conclusion of this workshop, patients will be able to demonstrate knowledge of the importance of sleep to overall health, well-being, and quality of life. They will understand the symptoms of, and treatments for, common sleep disorders. Patients will also be able to identify daytime and nighttime behaviors which impact sleep, and they will be able to apply these tools to help manage sleep-related challenges. The purpose of this lesson is to provide patients with a general overview of sleep and outline the importance of quality sleep. Patients will learn about a few of the most common sleep disorders. Patients will also be introduced to the concept of "sleep hygiene," and  discover ways to self-manage certain sleeping problems through simple daily behavior changes. Finally, the workshop will motivate patients by clarifying the links between quality sleep and their goals of heart-healthy living.   Recognizing and Reducing Stress Clinical staff led group instruction and group discussion with PowerPoint presentation and patient guidebook. To enhance  the learning environment the use of posters, models and videos may be added. At the conclusion of this workshop, patients will be able to understand the types of stress reactions, differentiate between acute and chronic stress, and recognize the impact that chronic stress has on their health. They will also be able to apply different coping mechanisms, such as reframing negative self-talk. Patients will have the opportunity to practice a variety of stress management techniques, such as deep abdominal breathing, progressive muscle relaxation, and/or guided imagery.  The purpose of this lesson is to educate patients on the role of stress in their lives and to provide healthy techniques for coping with it.  Learning Barriers/Preferences:  Learning Barriers/Preferences - 01/06/22 1006       Learning Barriers/Preferences   Learning Barriers None    Learning Preferences Verbal Instruction;Written Material             Education Topics:  Knowledge Questionnaire Score:   Core Components/Risk Factors/Patient Goals at Admission:  Personal Goals and Risk Factors at Admission - 01/06/22 0848       Core Components/Risk Factors/Patient Goals on Admission    Weight Management Yes;Obesity;Weight Loss    Intervention Weight Management/Obesity: Establish reasonable short term and long term weight goals.;Obesity: Provide education and appropriate resources to help participant work on and attain dietary goals.    Admit Weight 258 lb 3.2 oz (117.1 kg)    Goal Weight: Short Term 250 lb (113.4 kg)    Goal Weight: Long Term 220 lb  (99.8 kg)    Expected Outcomes Short Term: Continue to assess and modify interventions until short term weight is achieved;Long Term: Adherence to nutrition and physical activity/exercise program aimed toward attainment of established weight goal;Weight Loss: Understanding of general recommendations for a balanced deficit meal plan, which promotes 1-2 lb weight loss per week and includes a negative energy balance of [phone] kcal/d    Hypertension Yes    Intervention Provide education on lifestyle modifcations including regular physical activity/exercise, weight management, moderate sodium restriction and increased consumption of fresh fruit, vegetables, and low fat dairy, alcohol moderation, and smoking cessation.;Monitor prescription use compliance.    Expected Outcomes Short Term: Continued assessment and intervention until BP is < 140/64mm HG in hypertensive participants. < 130/31mm HG in hypertensive participants with diabetes, heart failure or chronic kidney disease.;Long Term: Maintenance of blood pressure at goal levels.    Lipids Yes    Intervention Provide education and support for participant on nutrition & aerobic/resistive exercise along with prescribed medications to achieve LDL '70mg'$ , HDL >$Remo'40mg'cothp$ .    Expected Outcomes Short Term: Participant states understanding of desired cholesterol values and is compliant with medications prescribed. Participant is following exercise prescription and nutrition guidelines.;Long Term: Cholesterol controlled with medications as prescribed, with individualized exercise RX and with personalized nutrition plan. Value goals: LDL < $Rem'70mg'xCJm$ , HDL > 40 mg.             Core Components/Risk Factors/Patient Goals Review:   Goals and Risk Factor Review     Row Name 01/12/22 1442 01/21/22 1226 02/17/22 0959         Core Components/Risk Factors/Patient Goals Review   Personal Goals Review Weight Management/Obesity;Hypertension;Lipids Weight  Management/Obesity;Hypertension;Lipids Weight Management/Obesity;Hypertension;Lipids     Review Patient started intensive cardiac rehab today, tolerated well, vss, c/o 4/10 chronic knee pain. Keona has completed 6 exercise and some educational sessions.  Sion is progressing  well with workloads and increased MET. Seen by neuro for leg weakness and drooling.  Weight measurement show a decrease of 3 kg since begining cardiac rehab.  Pt with normal and appopriate BP readings and is compliant with statin therapy. Yosgar has completed 12 exercise/education sessions.  Zacharius continue to make progress with weight loss. Khaden lost 4 KG since beginning Cardiac Rehab a bit discouraged as the weight loss has been slow. Pt exercies is somewhat limited as he had an injection to his left knee on 9/6.  Encouraged pt that this rate of weight loss is sustainable.  Glade admits he struggles with the Pritikin diet restrictions however he has made some changes.  pt is on optimal lipid management with statin therapy along with fish oil supplements, nutriton and exercise.  Vital signs remain well within normal limts.     Expected Outcomes Patient will maintain a healthy lifestyle, as he is provided the knowledge and tools neccessary to complete goal; he will increase exercise stamina during ICR program. Patient will maintain a healthy lifestyle, as he is provided the knowledge and tools neccessary to complete goal; he will increase exercise stamina during ICR program. Bricyn will adopt a heart healthy lifestyle in accordance to the three pillars learned with [facility]rit[facility]in Intensive Cardiac Rehab: Exercise, heart healthy nutrition and healthy mind set.              Core Components/Risk Factors/Patient Goals at Discharge (Final Review):   Goals and Risk Factor Review - 02/17/22 0959       Core Components/Risk Factors/Patient Goals Review   Personal Goals Review Weight Management/Obesity;Hypertension;Lipids    Review  Einar has completed 12 exercise/education sessions.  Jaccob continue to make progress with weight loss. Stanlee lost 4 KG since beginning Cardiac Rehab a bit discouraged as the weight loss has been slow. Pt exercies is somewhat limited as he had an injection to his left knee on 9/6.  Encouraged pt that this rate of weight loss is sustainable.  Marshun admits he struggles with the Pritikin diet restrictions however he has made some changes.  pt is on optimal lipid management with statin therapy along with fish oil supplements, nutriton and exercise.  Vital signs remain well within normal limts.    Expected Outcomes Mainor will adopt a heart healthy lifestyle in accordance to the three pillars learned with [facility] Intensive Cardiac Rehab: Exercise, heart healthy nutrition and healthy mind set.             ITP Comments:  ITP Comments     Row Name 01/06/22 0834 01/06/22 0944 01/21/22 1021       ITP Comments Medical Director- Dr. Fransico Him, MD Dr. Fransico Him Medical Director: Introduction to [facility]ritikin Education program/ Intensive Cardiac Rehab, initial Pritikin orientation pack reviewed 30 day ITP Review.  Jarek is off to a great start              Comments: Edman who is enrolled in Intensive Cardiac Rehab is making expected progress toward personal goals after completing 14 sessions. Recommend continued exercise and life style modification education including  stress management and relaxation techniques to decrease cardiac risk profile. Cherre Huger, BSN Cardiac and Training and development officer

## 2022-02-18 ENCOUNTER — Encounter (HOSPITAL_COMMUNITY)
Admission: RE | Admit: 2022-02-18 | Discharge: 2022-02-18 | Disposition: A | Discharge status: Home / Self Care | Payer: Medicare Other | Source: Ambulatory Visit | Attending: Cardiology | Admitting: Cardiology

## 2022-02-18 DIAGNOSIS — Z48812 Encounter for surgical aftercare following surgery on the circulatory system: Secondary | ICD-10-CM | POA: Diagnosis not present

## 2022-02-18 DIAGNOSIS — Z955 Presence of coronary angioplasty implant and graft: Secondary | ICD-10-CM | POA: Diagnosis not present

## 2022-02-20 ENCOUNTER — Encounter (HOSPITAL_COMMUNITY)
Admission: RE | Admit: 2022-02-20 | Discharge: 2022-02-20 | Disposition: A | Discharge status: Home / Self Care | Payer: Medicare Other | Source: Ambulatory Visit | Attending: Cardiology | Admitting: Cardiology

## 2022-02-20 DIAGNOSIS — Z955 Presence of coronary angioplasty implant and graft: Secondary | ICD-10-CM | POA: Diagnosis not present

## 2022-02-20 DIAGNOSIS — Z48812 Encounter for surgical aftercare following surgery on the circulatory system: Secondary | ICD-10-CM | POA: Diagnosis not present

## 2022-02-23 ENCOUNTER — Encounter (HOSPITAL_COMMUNITY)
Admission: RE | Admit: 2022-02-23 | Discharge: 2022-02-23 | Disposition: A | Discharge status: Home / Self Care | Payer: Medicare Other | Source: Ambulatory Visit | Attending: Cardiology | Admitting: Cardiology

## 2022-02-23 DIAGNOSIS — Z955 Presence of coronary angioplasty implant and graft: Secondary | ICD-10-CM

## 2022-02-23 DIAGNOSIS — Z48812 Encounter for surgical aftercare following surgery on the circulatory system: Secondary | ICD-10-CM | POA: Diagnosis not present

## 2022-02-25 ENCOUNTER — Encounter (HOSPITAL_COMMUNITY)
Admission: RE | Admit: 2022-02-25 | Discharge: 2022-02-25 | Disposition: A | Discharge status: Home / Self Care | Payer: Medicare Other | Source: Ambulatory Visit | Attending: Cardiology | Admitting: Cardiology

## 2022-02-25 DIAGNOSIS — Z48812 Encounter for surgical aftercare following surgery on the circulatory system: Secondary | ICD-10-CM | POA: Diagnosis not present

## 2022-02-25 DIAGNOSIS — Z955 Presence of coronary angioplasty implant and graft: Secondary | ICD-10-CM | POA: Diagnosis not present

## 2022-02-27 ENCOUNTER — Encounter (HOSPITAL_COMMUNITY)
Admission: RE | Admit: 2022-02-27 | Discharge: 2022-02-27 | Disposition: A | Discharge status: Home / Self Care | Payer: Medicare Other | Source: Ambulatory Visit | Attending: Cardiology | Admitting: Cardiology

## 2022-02-27 DIAGNOSIS — Z955 Presence of coronary angioplasty implant and graft: Secondary | ICD-10-CM | POA: Diagnosis not present

## 2022-02-27 DIAGNOSIS — Z48812 Encounter for surgical aftercare following surgery on the circulatory system: Secondary | ICD-10-CM | POA: Diagnosis not present

## 2022-03-02 ENCOUNTER — Encounter (HOSPITAL_COMMUNITY)
Admission: RE | Admit: 2022-03-02 | Discharge: 2022-03-02 | Disposition: A | Discharge status: Home / Self Care | Payer: Medicare Other | Source: Ambulatory Visit | Attending: Cardiology | Admitting: Cardiology

## 2022-03-02 DIAGNOSIS — Z48812 Encounter for surgical aftercare following surgery on the circulatory system: Secondary | ICD-10-CM | POA: Diagnosis not present

## 2022-03-02 DIAGNOSIS — I2510 Atherosclerotic heart disease of native coronary artery without angina pectoris: Secondary | ICD-10-CM | POA: Diagnosis not present

## 2022-03-02 DIAGNOSIS — Z955 Presence of coronary angioplasty implant and graft: Secondary | ICD-10-CM | POA: Diagnosis not present

## 2022-03-04 ENCOUNTER — Ambulatory Visit: Payer: Medicare Other | Admitting: Orthopaedic Surgery

## 2022-03-04 ENCOUNTER — Encounter (HOSPITAL_COMMUNITY)
Admission: RE | Admit: 2022-03-04 | Discharge: 2022-03-04 | Disposition: A | Discharge status: Home / Self Care | Payer: Medicare Other | Source: Ambulatory Visit | Attending: Cardiology | Admitting: Cardiology

## 2022-03-04 ENCOUNTER — Encounter: Payer: Self-pay | Admitting: Orthopaedic Surgery

## 2022-03-04 DIAGNOSIS — M1712 Unilateral primary osteoarthritis, left knee: Secondary | ICD-10-CM | POA: Diagnosis not present

## 2022-03-04 DIAGNOSIS — I2510 Atherosclerotic heart disease of native coronary artery without angina pectoris: Secondary | ICD-10-CM | POA: Diagnosis not present

## 2022-03-04 DIAGNOSIS — Z48812 Encounter for surgical aftercare following surgery on the circulatory system: Secondary | ICD-10-CM | POA: Diagnosis not present

## 2022-03-04 DIAGNOSIS — Z955 Presence of coronary angioplasty implant and graft: Secondary | ICD-10-CM

## 2022-03-04 DIAGNOSIS — M25562 Pain in left knee: Secondary | ICD-10-CM | POA: Diagnosis not present

## 2022-03-04 DIAGNOSIS — G8929 Other chronic pain: Secondary | ICD-10-CM

## 2022-03-04 NOTE — Progress Notes (Signed)
The patient is a 76 year old gentleman that I am seeing for weeks after placing hyaluronic acid in his left knee.  He has known significant arthritis of his left knee.  When I saw him for the gel injection, I did aspirate fluid from the knee and he was having such significant pain and felt it was worth seeing him back today.  He says that the gel shot did take away his constant pain but he still having some pain and giving way with the left knee.  Previous x-rays of the left knee and an MRI show areas of full-thickness cartilage loss specially laterally.  Examination of his left knee today does not show an effusion but there is still some pain.  I did go over knee replacement model with him and described what the surgery involves.  Since he is having some response to hyaluronic acid, I am fine with seeing how this does for long-term.  We can see him back in 2 months to see how he is doing overall and can always consider an aspiration and steroid injection then.  He does understand that if his pain worsens, we can see him back earlier and again work on considering him for knee replacement surgery.  He cannot take anti-inflammatories due to being on Plavix.  All questions and concerns were answered and addressed.

## 2022-03-06 ENCOUNTER — Encounter (HOSPITAL_COMMUNITY)
Admission: RE | Admit: 2022-03-06 | Discharge: 2022-03-06 | Disposition: A | Discharge status: Home / Self Care | Payer: Medicare Other | Source: Ambulatory Visit | Attending: Cardiology | Admitting: Cardiology

## 2022-03-06 DIAGNOSIS — Z955 Presence of coronary angioplasty implant and graft: Secondary | ICD-10-CM

## 2022-03-06 DIAGNOSIS — Z48812 Encounter for surgical aftercare following surgery on the circulatory system: Secondary | ICD-10-CM | POA: Diagnosis not present

## 2022-03-06 DIAGNOSIS — I2510 Atherosclerotic heart disease of native coronary artery without angina pectoris: Secondary | ICD-10-CM | POA: Diagnosis not present

## 2022-03-09 ENCOUNTER — Encounter (HOSPITAL_COMMUNITY)
Admission: RE | Admit: 2022-03-09 | Discharge: 2022-03-09 | Disposition: A | Discharge status: Home / Self Care | Payer: Medicare Other | Source: Ambulatory Visit | Attending: Cardiology | Admitting: Cardiology

## 2022-03-09 DIAGNOSIS — I2510 Atherosclerotic heart disease of native coronary artery without angina pectoris: Secondary | ICD-10-CM | POA: Diagnosis not present

## 2022-03-09 DIAGNOSIS — Z48812 Encounter for surgical aftercare following surgery on the circulatory system: Secondary | ICD-10-CM | POA: Diagnosis not present

## 2022-03-09 DIAGNOSIS — Z955 Presence of coronary angioplasty implant and graft: Secondary | ICD-10-CM | POA: Diagnosis not present

## 2022-03-10 NOTE — Progress Notes (Addendum)
Cardiac Individual Treatment Plan  Patient Details  Name: Daniel Whitaker MRN: [id number] Date of Birth: [birth year] Referring Provider:   Flowsheet Row INTENSIVE CARDIAC REHAB ORIENT from 01/06/2022 in [facility] [facility]  Referring Provider Minus Breeding, MD       Initial Encounter Date:  [place] from 01/06/2022 in [place]  Date 01/06/22       Visit Diagnosis: 10/22/21 DES LAD  Patient's Home Medications on Admission:  Current Outpatient Medications:    aspirin EC 81 MG tablet, Take 1 tablet (81 mg total) by mouth daily. Swallow whole., Disp: 30 tablet, Rfl: 0   brimonidine (ALPHAGAN) 0.2 % ophthalmic solution, Place 1 drop into both eyes in the morning and at bedtime., Disp: , Rfl:    carvedilol (COREG) 3.125 MG tablet, Take 1 tablet (3.125 mg total) by mouth 2 (two) times daily., Disp: 60 tablet, Rfl: 4   cetirizine (ZYRTEC) 10 MG tablet, Take 10 mg by mouth every morning., Disp: , Rfl:    Cholecalciferol (VITAMIN D) 2000 UNITS CAPS, Take 2,000 Units by mouth daily., Disp: , Rfl:    clopidogrel (PLAVIX) 75 MG tablet, Take 1 tablet (75 mg total) by mouth daily., Disp: 90 tablet, Rfl: 1   Cyanocobalamin (VITAMIN B-12) 5000 MCG TBDP, Take 10,000 mcg by mouth every morning., Disp: , Rfl:    dorzolamide-timolol (COSOPT) 22.3-6.8 MG/ML ophthalmic solution, Place 1 drop into the left eye 2 (two) times daily., Disp: , Rfl:    ketoconazole (NIZORAL) 2 % cream, Apply 1 application  topically daily as needed for irritation., Disp: , Rfl:    latanoprost (XALATAN) 0.005 % ophthalmic solution, Place 1 drop into both eyes at bedtime., Disp: , Rfl:    lidocaine (XYLOCAINE) 5 % ointment, Apply 1 application topically 2 (two) times daily as needed. (Patient taking differently: Apply 1 application  topically 2 (two) times daily as needed (neuropathy).), Disp: 35.44 g, Rfl: 3   MAGNESIUM PO, Take 400 mg by  mouth daily., Disp: , Rfl:    Multiple Vitamins-Minerals (CENTRUM) tablet, Take 1 tablet by mouth daily. 50 +, Disp: , Rfl:    nitroGLYCERIN (NITROSTAT) 0.4 MG SL tablet, Place 1 tablet (0.4 mg total) under the tongue every 5 (five) minutes as needed., Disp: 25 tablet, Rfl: 2   Omega-3 Fatty Acids (FISH OIL) 1200 MG CAPS, Take 3,600 mg by mouth daily. 3, Disp: , Rfl:    pantoprazole (PROTONIX) 40 MG tablet, Take 40 mg by mouth daily. , Disp: , Rfl:    pregabalin (LYRICA) 100 MG capsule, One in the am, one at noon and 2 tabs at night, Disp: 120 capsule, Rfl: 5   rOPINIRole (REQUIP) 2 MG tablet, Take 1 tablet at 5 Pm and 1/2 tablet at bedtime, Disp: 135 tablet, Rfl: 3   rosuvastatin (CRESTOR) 40 MG tablet, Take 1 tablet (40 mg total) by mouth daily., Disp: 90 tablet, Rfl: 1   sildenafil (REVATIO) 20 MG tablet, Take 20-100 mg by mouth daily as needed (ED)., Disp: , Rfl:   Past Medical History: Past Medical History:  Diagnosis Date   Arthritis    Cataract    removed bilaterally    Coronary artery disease    Erectile dysfunction    Esophageal reflux    Glaucoma    Hypertriglyceridemia    Impaired fasting glucose    MGUS (monoclonal gammopathy of unknown significance)    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  Nocturnal leg cramps 03/11/2021   Nontoxic uninodular goiter    Obesity    Peripheral neuropathy    RLS (restless legs syndrome)    Spinal stenosis    Unspecified deficiency anemia 07/11/2013    Tobacco Use: Social History   Tobacco Use  Smoking Status Former   Types: Cigarettes   Quit date: 06/01/1984   Years since quitting: 37.7  Smokeless Tobacco Never    Labs: Review Flowsheet       Latest Ref Rng & Units 04/15/2013  Labs for ITP Cardiac and Pulmonary Rehab  Hemoglobin A1c <5.7 % 5.2     Capillary Blood Glucose: Lab Results  Component Value Date   GLUCAP 112 (H) 04/18/2013   GLUCAP 102 (H) 04/18/2013   GLUCAP 111 (H) 04/17/2013   GLUCAP 119 (H)  04/17/2013   GLUCAP 106 (H) 04/17/2013     Exercise Target Goals: Exercise Program Goal: Individual exercise prescription set using results from initial 6 min walk test and THRR while considering  patient's activity barriers and safety.   Exercise Prescription Goal: Initial exercise prescription builds to 30-45 minutes a day of aerobic activity, 2-3 days per week.  Home exercise guidelines will be given to patient during program as part of exercise prescription that the participant will acknowledge.  Activity Barriers & Risk Stratification:  Activity Barriers & Cardiac Risk Stratification - 01/06/22 0903       Activity Barriers & Cardiac Risk Stratification   Activity Barriers Arthritis;Back Problems;History of Falls;Balance Concerns;Other (comment)    Comments Neuropathy in both feet, "bad" left knee, gets injections; physical therapy for recent left hand injury; hx crushed vertebrae, back pain, gets injections.    Cardiac Risk Stratification Low             6 Minute Walk:  6 Minute Walk     Row Name 01/06/22 0914         6 Minute Walk   Phase Initial     Distance 1766 feet     Walk Time 6 minutes     # of Rest Breaks 0     MPH 3.34     METS 3.4     RPE 12     Perceived Dyspnea  1     VO2 Peak 11.91     Symptoms Yes (comment)     Comments Patient c/o lower, left leg pain, 4/10 on the pain scale; mild SOB, RPD=1.     Resting HR 71 bpm     Resting BP 133/70     Resting Oxygen Saturation  98 %     Exercise Oxygen Saturation  during 6 min walk 95 %     Max Ex. HR 104 bpm     Max Ex. BP 178/60     2 Minute Post BP 166/80              Oxygen Initial Assessment:   Oxygen Re-Evaluation:   Oxygen Discharge (Final Oxygen Re-Evaluation):   Initial Exercise Prescription:  Initial Exercise Prescription - 01/06/22 0900       Date of Initial Exercise RX and Referring Provider   Date 01/06/22    Referring Provider Minus Breeding, MD    Expected Discharge  Date 03/13/22      Recumbant Bike   Level 1.5    Minutes 15    METs 2      NuStep   Level 2    SPM 85    Minutes 15  METs 2      Prescription Details   Frequency (times per week) 3    Duration Progress to 30 minutes of continuous aerobic without signs/symptoms of physical distress      Intensity   THRR 40-80% of Max Heartrate 58-116    Ratings of Perceived Exertion 11-13    Perceived Dyspnea 0-4      Progression   Progression Continue to progress workloads to maintain intensity without signs/symptoms of physical distress.      Resistance Training   Training Prescription Yes    Weight 3 lbs    Reps 10-15             Perform Capillary Blood Glucose checks as needed.  Exercise Prescription Changes:   Exercise Prescription Changes     Row Name 01/12/22 [phone] 01/28/22 0823 02/04/22 0936 02/20/22 0821       Response to Exercise   Blood Pressure (Admit) 128/64 122/62 124/70 104/58    Blood Pressure (Exercise) 140/64 138/68 130/64 140/70    Blood Pressure (Exit) 114/68 128/64 106/62 112/60    Heart Rate (Admit) 66 bpm 64 bpm 68 bpm 67 bpm    Heart Rate (Exercise) 82 bpm 86 bpm 91 bpm 97 bpm    Heart Rate (Exit) 63 bpm 64 bpm 69 bpm 66 bpm    Rating of Perceived Exertion (Exercise) 10.5 11.5 11.5 12.5    Perceived Dyspnea (Exercise) 0 0 0 0    Symptoms 4/10 chronic L knee pain Mild low back pain None None    Comments First day in [facility]2 program Reviewed MET's, goals and home ExRx Reviewed MET's, Reviewed METs and goals    Duration Progress to 30 minutes of  aerobic without signs/symptoms of physical distress Progress to 30 minutes of  aerobic without signs/symptoms of physical distress Progress to 30 minutes of  aerobic without signs/symptoms of physical distress Progress to 30 minutes of  aerobic without signs/symptoms of physical distress    Intensity THRR unchanged THRR unchanged THRR unchanged THRR unchanged      Progression   Progression Continue to progress  workloads to maintain intensity without signs/symptoms of physical distress. Continue to progress workloads to maintain intensity without signs/symptoms of physical distress. Continue to progress workloads to maintain intensity without signs/symptoms of physical distress. Continue to progress workloads to maintain intensity without signs/symptoms of physical distress.    Average METs 1.7 2.05 2.45 3.03      Resistance Training   Training Prescription Yes No No Yes    Weight 3 lbs -- -- 5 lbs wts    Reps 10-15 -- -- 10-15    Time 10 Minutes -- -- 10 Minutes      Recumbant Bike   Level 1._0 Minutes _1 METs 1.6 1.7 2 2.6      NuStep   Level 2 2._2 SPM 85 85 85 85    Minutes _3 METs 1.8 2.4 2.9 3.4      Home Exercise Plan   Plans to continue exercise at -- Home (comment) Home (comment) Home (comment)    Frequency -- Add 1 additional day to program exercise sessions. Add 1 additional day to program exercise sessions. Add 1 additional day to program exercise sessions.    Initial Home Exercises Provided -- 01/28/22 01/28/22 01/28/22  Exercise Comments:   Exercise Comments     Row Name 01/28/22 [phone] 02/04/22 0939 02/20/22 4627       Exercise Comments Reviewed MET's, goals and home ExRx. Pt tolerated exercise well with an average MET level of 2.05. Pt feels good about his wt loss goal and will continue to work on it. Pt will continue to exercise on his own by walking, treadmill and swimming 1-2 days a week. Pt is also very active in his normal lifestyle and has been working in the yard often Reviewed MET's. Pt tolerated exercise well with an average MET level of 2.45. Overall pt is feeling good and plans to add resistance to the recubent bike on friday. Reviewed MET's, and goals. Pt tolerated exercise well with an average MET level of 3.03. Pt is doing well with his primary goal of wt loss and is continuing to lose wt. He also states that  he is feeling better and has less shortness of breath and can tell a difference in his endurance              Exercise Goals and Review:   Exercise Goals     Row Name 01/06/22 0846             Exercise Goals   Increase Physical Activity Yes       Intervention Provide advice, education, support and counseling about physical activity/exercise needs.;Develop an individualized exercise prescription for aerobic and resistive training based on initial evaluation findings, risk stratification, comorbidities and participant's personal goals.       Expected Outcomes Short Term: Attend rehab on a regular basis to increase amount of physical activity.;Long Term: Exercising regularly at least 3-5 days a week.;Long Term: Add in home exercise to make exercise part of routine and to increase amount of physical activity.       Increase Strength and Stamina Yes       Intervention Provide advice, education, support and counseling about physical activity/exercise needs.;Develop an individualized exercise prescription for aerobic and resistive training based on initial evaluation findings, risk stratification, comorbidities and participant's personal goals.       Expected Outcomes Short Term: Increase workloads from initial exercise prescription for resistance, speed, and METs.;Short Term: Perform resistance training exercises routinely during rehab and add in resistance training at home;Long Term: Improve cardiorespiratory fitness, muscular endurance and strength as measured by increased METs and functional capacity (6MWT)       Able to understand and use rate of perceived exertion (RPE) scale Yes       Intervention Provide education and explanation on how to use RPE scale       Expected Outcomes Short Term: Able to use RPE daily in rehab to express subjective intensity level;Long Term:  Able to use RPE to guide intensity level when exercising independently       Knowledge and understanding of Target Heart  Rate Range (THRR) Yes       Intervention Provide education and explanation of THRR including how the numbers were predicted and where they are located for reference       Expected Outcomes Short Term: Able to state/look up THRR;Long Term: Able to use THRR to govern intensity when exercising independently;Short Term: Able to use daily as guideline for intensity in rehab       Able to check pulse independently Yes       Intervention Provide education and demonstration on how to check pulse in carotid and radial arteries.;Review the importance of  being able to check your own pulse for safety during independent exercise       Expected Outcomes Short Term: Able to explain why pulse checking is important during independent exercise;Long Term: Able to check pulse independently and accurately       Understanding of Exercise Prescription Yes       Intervention Provide education, explanation, and written materials on patient's individual exercise prescription       Expected Outcomes Short Term: Able to explain program exercise prescription;Long Term: Able to explain home exercise prescription to exercise independently                Exercise Goals Re-Evaluation :  Exercise Goals Re-Evaluation     Row Name 01/12/22 0850 01/28/22 0826 02/04/22 0938 02/20/22 0824       Exercise Goal Re-Evaluation   Exercise Goals Review Increase Physical Activity;Increase Strength and Stamina;Able to understand and use rate of perceived exertion (RPE) scale;Knowledge and understanding of Target Heart Rate Range (THRR);Understanding of Exercise Prescription Increase Physical Activity;Increase Strength and Stamina;Able to understand and use rate of perceived exertion (RPE) scale;Knowledge and understanding of Target Heart Rate Range (THRR);Understanding of Exercise Prescription Increase Physical Activity;Increase Strength and Stamina;Able to understand and use rate of perceived exertion (RPE) scale;Knowledge and  understanding of Target Heart Rate Range (THRR);Understanding of Exercise Prescription Increase Physical Activity;Increase Strength and Stamina;Able to understand and use rate of perceived exertion (RPE) scale;Knowledge and understanding of Target Heart Rate Range (THRR);Understanding of Exercise Prescription    Comments Pt first day in theCRP2 program. Pt tolerated exercise well with 4/10 chronic left knee pain and an average MET level of 1.7. Pt is learning his THRR, RPE and ExRx. Reviewed MET's, goals and home ExRx. Pt tolerated exercise well with an average MET level of 2.05. Pt feels good about his wt loss goal and will continue to work on it. Pt will continue to exercise on his own by walking, treadmill and swimming 1-2 days a week. Pt is also very active in his normal lifestyle and has been working in the yard often Reviewed MET's. Pt tolerated exercise well with an average MET level of 2.45. Overall pt is feeling good and plans to add resistance to the recubent bike on friday. Reviewed MET's, and goals. Pt tolerated exercise well with an average MET level of 3.03. Pt is doing well with his primary goal of wt loss and is continuing to lose wt. He also states that he is feeling better and has less shortness of breath and can tell a difference in his endurance    Expected Outcomes Will continue to monitor pt and progress workloads as tolerated without sign or symptom Pt will continue to exercise on his own. Will continue to monitor pt and progress workloads as tolerated without sign or symptom Will continue to monitor pt and progress workloads as tolerated without sign or symptom Will continue to monitor pt and progress workloads as tolerated without sign or symptom             Discharge Exercise Prescription (Final Exercise Prescription Changes):  Exercise Prescription Changes - 02/20/22 0821       Response to Exercise   Blood Pressure (Admit) 104/58    Blood Pressure (Exercise) 140/70     Blood Pressure (Exit) 112/60    Heart Rate (Admit) 67 bpm    Heart Rate (Exercise) 97 bpm    Heart Rate (Exit) 66 bpm    Rating of Perceived Exertion (Exercise) 12.5  Perceived Dyspnea (Exercise) 0    Symptoms None    Comments Reviewed METs and goals    Duration Progress to 30 minutes of  aerobic without signs/symptoms of physical distress    Intensity THRR unchanged      Progression   Progression Continue to progress workloads to maintain intensity without signs/symptoms of physical distress.    Average METs 3.03      Resistance Training   Training Prescription Yes    Weight 5 lbs wts    Reps 10-15    Time 10 Minutes      Recumbant Bike   Level 3    Minutes 15    METs 2.6      NuStep   Level 5    SPM 85    Minutes 15    METs 3.4      Home Exercise Plan   Plans to continue exercise at Home (comment)    Frequency Add 1 additional day to program exercise sessions.    Initial Home Exercises Provided 01/28/22             Nutrition:  Target Goals: Understanding of nutrition guidelines, daily intake of sodium '1500mg'$ , cholesterol '200mg'$ , calories 30% from fat and 7% or less from saturated fats, daily to have 5 or more servings of fruits and vegetables.  Biometrics:  Pre Biometrics - 01/06/22 0834       Pre Biometrics   Waist Circumference 48 inches    Hip Circumference 47 inches    Waist to Hip Ratio 1.02 %    Triceps Skinfold 20 mm    % Body Fat 35.1 %    Grip Strength 34 kg    Flexibility --   Not performed, issues with back pain.   Single Leg Stand 1.5 seconds              Nutrition Therapy Plan and Nutrition Goals:  Nutrition Therapy & Goals - 02/11/22 1646       Nutrition Therapy   Diet Heart Healthy Diet    Drug/Food Interactions Statins/Certain Fruits      Personal Nutrition Goals   Nutrition Goal Patient to choose a daily variety of fruits, vegetables, whole grains, lean protein, nonfat/low fat dairy as part of heart healthy lifestyle     Personal Goal #2 Patient to limit sodium intake to '1500mg'$  daily    Personal Goal #3 Patient to identify and limit daily intake of saturated fat, trans fat, sodium, and refined carbohydrates    Comments Goals in progress. Patient continues to attend the [employer] education classes. He is down 6.6# since starting with our program.      Intervention Plan   Intervention Prescribe, educate and counsel regarding individualized specific dietary modifications aiming towards targeted core components such as weight, hypertension, lipid management, diabetes, heart failure and other comorbidities.;Nutrition handout(s) given to patient.    Expected Outcomes Short Term Goal: Understand basic principles of dietary content, such as calories, fat, sodium, cholesterol and nutrients.;Long Term Goal: Adherence to prescribed nutrition plan.             Nutrition Assessments:  MEDIFICTS Score Key: ?70 Need to make dietary changes  40-70 Heart Healthy Diet ? 40 Therapeutic Level Cholesterol Diet    Picture Your Plate Scores: <50 Unhealthy dietary pattern with much room for improvement. 41-50 Dietary pattern unlikely to meet recommendations for good health and room for improvement. 51-60 More healthful dietary pattern, with some room for improvement.  >60 Healthy dietary pattern,  although there may be some specific behaviors that could be improved.    Nutrition Goals Re-Evaluation:  Nutrition Goals Re-Evaluation     [place] Name 01/12/22 1630 02/11/22 1646           Goals   Current Weight 255 lb 15.3 oz (116.1 kg) 251 lb 8.7 oz (114.1 kg)      Comment A1c normal, LDL 112 no new labs at this time.      Expected Outcome -- Goals in progress. Patient continues to attend the [employer] education classes. He is down 6.6# since starting with our program.               Nutrition Goals Re-Evaluation:  Nutrition Goals Re-Evaluation     [place] Name 01/12/22 1630 02/11/22 1646           Goals    Current Weight 255 lb 15.3 oz (116.1 kg) 251 lb 8.7 oz (114.1 kg)      Comment A1c normal, LDL 112 no new labs at this time.      Expected Outcome -- Goals in progress. Patient continues to attend the [employer] education classes. He is down 6.6# since starting with our program.               Nutrition Goals Discharge (Final Nutrition Goals Re-Evaluation):  Nutrition Goals Re-Evaluation - 02/11/22 1646       Goals   Current Weight 251 lb 8.7 oz (114.1 kg)    Comment no new labs at this time.    Expected Outcome Goals in progress. Patient continues to attend the [employer] education classes. He is down 6.6# since starting with our program.             Psychosocial: Target Goals: Acknowledge presence or absence of significant depression and/or stress, maximize coping skills, provide positive support system. Participant is able to verbalize types and ability to use techniques and skills needed for reducing stress and depression.  Initial Review & Psychosocial Screening:  Initial Psych Review & Screening - 01/06/22 1031       Initial Review   Current issues with None Identified      Family Dynamics   Good Support System? Yes    Comments Patient reports good support system with his wife, 2 children and his grandchildren.      Barriers   Psychosocial barriers to participate in program There are no identifiable barriers or psychosocial needs.             Quality of Life Scores:  Quality of Life - 01/06/22 1012       Quality of Life   Select Quality of Life      Quality of Life Scores   Health/Function Pre 24.4 %    Socioeconomic Pre 28.29 %    Psych/Spiritual Pre 30 %    Family Pre 28.8 %    GLOBAL Pre 27 %            Scores of 19 and below usually indicate a poorer quality of life in these areas.  A difference of  2-3 points is a clinically meaningful difference.  A difference of 2-3 points in the total score of the Quality of Life Index has been associated  with significant improvement in overall quality of life, self-image, physical symptoms, and general health in studies assessing change in quality of life.  PHQ-9: Review Flowsheet       01/06/2022 01/22/2014  Depression screen PHQ 2/9  Decreased Interest 0 0  Down, Depressed, Hopeless 0 0  PHQ - 2 Score 0 0   Interpretation of Total Score  Total Score Depression Severity:  1-4 = Minimal depression, 5-9 = Mild depression, 10-14 = Moderate depression, 15-19 = Moderately severe depression, 20-27 = Severe depression   Psychosocial Evaluation and Intervention:  Psychosocial Evaluation - 01/21/22 1224       Psychosocial Evaluation & Interventions   Interventions Stress management education;Relaxation education;Encouraged to exercise with the program and follow exercise prescription    Comments Tuck feels supported by his family  in his efforts to adopt a heart healthy lifestyle.  No identified barriers to participating in cardiac rehab    Expected Outcomes Tryton will continue to be supported by his family and no reportable barriers to participating in cardiac rehab    Continue Psychosocial Services  No Follow up required             Psychosocial Re-Evaluation:  Psychosocial Re-Evaluation     [place] Name 02/17/22 0951 03/10/22 1310           Psychosocial Re-Evaluation   Current issues with None Identified None Identified      Comments Elizabeth is pleased with the progress he has made thus far particular with weight loss. Continues to feel supported by his family in his efforts to asopt a heart healthy lifestyle particular with the nutrition aspect. Haydyn is pleased with the progress he has made thus far particular with weight loss although it does fluctuate up and down. Continues to feel supported by his family in his efforts to asopt a heart healthy lifestyle particular with the nutrition aspect. Looking forward to graduating this friday.      Expected Outcomes Lawyer will  continue to display a hopeful and positive outlook on life. Kwane will continue to display a hopeful and positive outlook on life.      Interventions Encouraged to attend Cardiac Rehabilitation for the exercise;Stress management education Encouraged to attend Cardiac Rehabilitation for the exercise;Stress management education      Continue Psychosocial Services  No Follow up required No Follow up required               Psychosocial Discharge (Final Psychosocial Re-Evaluation):  Psychosocial Re-Evaluation - 03/10/22 1310       Psychosocial Re-Evaluation   Current issues with None Identified    Comments Lakendrick is pleased with the progress he has made thus far particular with weight loss although it does fluctuate up and down. Continues to feel supported by his family in his efforts to asopt a heart healthy lifestyle particular with the nutrition aspect. Looking forward to graduating this friday.    Expected Outcomes Cornelious will continue to display a hopeful and positive outlook on life.    Interventions Encouraged to attend Cardiac Rehabilitation for the exercise;Stress management education    Continue Psychosocial Services  No Follow up required             Vocational Rehabilitation: Provide vocational rehab assistance to qualifying candidates.   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 Participant will state  understanding/return demonstration of topics presented.    Education     Row Name 01/12/22 0900     Education   Cardiac Education Topics Pritikin   Select Workshops      Workshops   Educator Dietitian   Select Nutrition   Nutrition Workshop Fueling a Designer, multimedia   Instruction Review Code 1- Verbalizes Understanding   Class Start Time 0815   Class Stop Time 0904   Class Time Calculation (min) 49 min    [place] Name 01/14/22 0900     Education   Cardiac Education Topics [employer]   Educator Dietitian   Weekly Topic Adding Flavor - Sodium-Free   Instruction Review Code 1- Verbalizes Understanding   Class Start Time 0815   Class Stop Time 0857   Class Time Calculation (min) 42 min    [place] Name 01/16/22 0800     Education   Cardiac Education Topics Pritikin   Select Core Videos     Core Videos   Educator Nurse   Select General Education   General Education Heart Disease Risk Reduction   Instruction Review Code 1- Verbalizes Understanding   Class Start Time 0815   Class Stop Time 0855   Class Time Calculation (min) 40 min    [place] Name 01/19/22 0900     Education   Cardiac Education Topics Pritikin   Select Workshops     Workshops   Educator Exercise Physiologist   Select Psychosocial   Psychosocial Workshop Recognizing and Reducing Stress   Instruction Review Code 1- Verbalizes Understanding   Class Start Time 0810   Class Stop Time 0904   Class Time Calculation (min) 54 min    [place] Name 01/21/22 0900     Education   Cardiac Education Topics [employer]   Educator Dietitian   Weekly Topic Fast and Healthy Breakfasts   Instruction Review Code 1- Verbalizes Understanding   Class Start Time 0815   Class Stop Time 0905   Class Time Calculation (min) 50 min    [place] Name 01/23/22 0900     Education   Cardiac Education Topics Pritikin   Select Core Videos     Core Videos   Educator Dietitian   Select Nutrition   Nutrition Overview of the [place]   Instruction Review Code 1- Verbalizes Understanding   Class Start Time 0815   Class  Stop Time 0900   Class Time Calculation (min) 45 min    [place] Name 01/26/22 0900     Education   Cardiac Education Topics Pritikin   Select Workshops     Workshops   Educator Exercise Physiologist   Select Exercise   Exercise Workshop Hotel manager and Fall Prevention   Instruction Review Code 1- Verbalizes Understanding   Class Start Time [phone]   Class Stop Time 0900   Class Time Calculation (min) 50 min    [place] Name 01/28/22 1000     Education   Cardiac Education Topics [employer]   Educator Dietitian   Weekly Topic Personalizing Your Pritikin Plate   Instruction Review Code 1- Verbalizes Understanding   Class Start Time 0815   Class Stop Time 0900   Class Time Calculation (min) 45 min    [place] Name 01/30/22 0900     Education   Cardiac Education Topics  Pritikin     Core Videos   Educator Nurse   Select Psychosocial   Psychosocial Healthy Minds, Bodies, Hearts   Instruction Review Code 1- Verbalizes Understanding   Class Start Time [phone]   Class Stop Time 0853   Class Time Calculation (min) 35 min    [place] Name 02/04/22 0900     Education   Cardiac Education Topics [employer]   Educator Dietitian   Weekly Topic Tasty Appetizers and Snacks   Instruction Review Code 1- Verbalizes Understanding   Class Start Time [phone]   Class Stop Time 0850   Class Time Calculation (min) 40 min    [place] Name 02/06/22 0900     Education   Cardiac Education Topics Pritikin   Select Core Videos     Core Videos   Educator Dietitian   Select Nutrition   Nutrition Other  Label Reading   Instruction Review Code 1- Verbalizes Understanding   Class Start Time [phone]   Class Stop Time 0856   Class Time Calculation (min) 44 min    [place] Name 02/09/22 1000     Education   Cardiac Education Topics Pritikin   Select Core Videos     Core Videos   Educator Dietitian   Select Nutrition    Nutrition Calorie Density   Instruction Review Code 1- Verbalizes Understanding   Class Start Time 0810   Class Stop Time 0856   Class Time Calculation (min) 46 min    [place] Name 02/13/22 0900     Education   Cardiac Education Topics Pritikin   Select Workshops     Workshops   Educator Exercise Physiologist   Select Psychosocial   Psychosocial Workshop Managing Moods and Relationships   Instruction Review Code 1- Verbalizes Understanding   Class Start Time 0810   Class Stop Time 0851   Class Time Calculation (min) 41 min    [place] Name 02/16/22 0900     Education   Cardiac Education Topics [place]   Select Workshops     Workshops   Educator Exercise Physiologist   Select Exercise   Exercise Workshop Exercise Basics: Building Your Action Plan   Instruction Review Code 1- Verbalizes Understanding   Class Start Time [phone]   Class Stop Time 0855   Class Time Calculation (min) 43 min    [place] Name 02/18/22 0900     Education   Cardiac Education Topics [employer]   Educator Dietitian   Weekly Topic Efficiency Cooking - Meals in a Snap   Instruction Review Code 1- Verbalizes Understanding   Class Start Time 0809   Class Stop Time 0844   Class Time Calculation (min) 35 min    [place] Name 02/20/22 0900     Education   Cardiac Education Topics Pritikin   Academic librarian Exercise Education   Exercise Education Move It!   Instruction Review Code 1- Verbalizes Understanding   Class Start Time 0815   Class Stop Time 0853   Class Time Calculation (min) 38 min    [place] Name 02/23/22 0900     Education   Cardiac Education Topics Pritikin   Select Core Videos     Core Videos   Educator Dietitian   Select Nutrition   Nutrition Nutrition Action Plan  Instruction Review Code 1- Verbalizes Understanding   Class Start Time 0815   Class Stop Time 0852   Class Time  Calculation (min) 37 min    [place] Name 02/25/22 0900     Education   Cardiac Education Topics [employer]   Educator Dietitian   Weekly Topic Simple Sides and Sauces   Instruction Review Code 1- Verbalizes Understanding   Class Start Time 0809   Class Stop Time 0840   Class Time Calculation (min) 31 min    [place] Name 02/27/22 0900     Education   Cardiac Education Topics Pritikin   Select Core Videos     Core Videos   Educator Nurse   Select General Education   General Education Hypertension and Heart Disease   Instruction Review Code 1- Verbalizes Understanding   Class Start Time 0815   Class Stop Time 0850   Class Time Calculation (min) 35 min    [place] Name 03/02/22 0900     Education   Cardiac Education Topics Pritikin   Select Core Videos     Core Videos   Educator Dietitian   Select Nutrition   Nutrition Dining Out - Part 1   Instruction Review Code 1- Verbalizes Understanding   Class Start Time 0815   Class Stop Time 0850   Class Time Calculation (min) 35 min    [place] Name 03/04/22 0900     Education   Cardiac Education Topics [employer]   Educator Dietitian   Weekly Topic One-Pot Wonders   Instruction Review Code 1- Verbalizes Understanding   Class Start Time [phone]   Class Stop Time 0845   Class Time Calculation (min) 35 min    [place] Name 03/06/22 0900     Education   Cardiac Education Topics Pritikin   Select Workshops     Workshops   Educator Exercise Physiologist   Select Psychosocial   Psychosocial Workshop New Thoughts, New Behaviors   Instruction Review Code 1- Verbalizes Understanding   Class Start Time 0810   Class Stop Time 0848   Class Time Calculation (min) 38 min    [place] Name 03/09/22 0900     Education   Cardiac Education Topics Pritikin   Select Core Videos     Core Videos   Educator Exercise Physiologist   Select Exercise Education   Exercise  Education Biomechanial Limitations   Instruction Review Code 1- Verbalizes Understanding   Class Start Time 0815   Class Stop Time 0900   Class Time Calculation (min) 45 min            Core Videos: Exercise    Move It!  Clinical staff conducted group or individual video education with verbal and written material and guidebook.  Patient learns the recommended Pritikin exercise program. Exercise with the goal of living a long, healthy life. Some of the health benefits of exercise include controlled diabetes, healthier blood pressure levels, improved cholesterol levels, improved heart and lung capacity, improved sleep, and better body composition. Everyone should speak with their doctor before starting or changing an exercise routine.  Biomechanical Limitations Clinical staff conducted group or individual video education with verbal and written material and guidebook.  Patient learns how biomechanical limitations can impact exercise and how we can mitigate and possibly overcome limitations to have an impactful and balanced exercise routine.  Body Composition Clinical staff conducted  group or individual video education with verbal and written material and guidebook.  Patient learns that body composition (ratio of muscle mass to fat mass) is a key component to assessing overall fitness, rather than body weight alone. Increased fat mass, especially visceral belly fat, can put [place] at increased risk for metabolic syndrome, type 2 diabetes, heart disease, and even death. It is recommended to combine diet and exercise (cardiovascular and resistance training) to improve your body composition. Seek guidance from your physician and exercise physiologist before implementing an exercise routine.  Exercise Action Plan Clinical staff conducted group or individual video education with verbal and written material and guidebook.  Patient learns the recommended strategies to achieve and enjoy long-term  exercise adherence, including variety, self-motivation, self-efficacy, and positive decision making. Benefits of exercise include fitness, good health, weight management, more energy, better sleep, less stress, and overall well-being.  Medical   Heart Disease Risk Reduction Clinical staff conducted group or individual video education with verbal and written material and guidebook.  Patient learns our heart is our most vital organ as it circulates oxygen, nutrients, white blood cells, and hormones throughout the entire body, and carries waste away. Data supports a plant-based eating plan like the Pritikin Program for its effectiveness in slowing progression of and reversing heart disease. The video provides a number of recommendations to address heart disease.   Metabolic Syndrome and Belly Fat  Clinical staff conducted group or individual video education with verbal and written material and guidebook.  Patient learns what metabolic syndrome is, how it leads to heart disease, and how one can reverse it and keep it from coming back. You have metabolic syndrome if you have 3 of the following 5 criteria: abdominal obesity, high blood pressure, high triglycerides, low HDL cholesterol, and high blood sugar.  Hypertension and Heart Disease Clinical staff conducted group or individual video education with verbal and written material and guidebook.  Patient learns that high blood pressure, or hypertension, is very common in the [place]. Hypertension is largely due to excessive salt intake, but other important risk factors include being overweight, physical inactivity, drinking too much alcohol, smoking, and not eating enough potassium from fruits and vegetables. High blood pressure is a leading risk factor for heart attack, stroke, congestive heart failure, dementia, kidney failure, and premature death. Long-term effects of excessive salt intake include stiffening of the arteries and thickening of heart  muscle and organ damage. Recommendations include ways to reduce hypertension and the risk of heart disease.  Diseases of Our Time - Focusing on Diabetes Clinical staff conducted group or individual video education with verbal and written material and guidebook.  Patient learns why the best way to stop diseases of our time is prevention, through food and other lifestyle changes. Medicine (such as prescription pills and surgeries) is often only a Band-Aid on the problem, not a long-term solution. Most common diseases of our time include obesity, type 2 diabetes, hypertension, heart disease, and cancer. The Pritikin Program is recommended and has been proven to help reduce, reverse, and/or prevent the damaging effects of metabolic syndrome.  Nutrition   Overview of the Pritikin Eating Plan  Clinical staff conducted group or individual video education with verbal and written material and guidebook.  Patient learns about the [place] for disease risk reduction. The [place] emphasizes a wide variety of unrefined, minimally-processed carbohydrates, like fruits, vegetables, whole grains, and legumes. Go, Caution, and Stop food choices are explained. Plant-based and lean animal  proteins are emphasized. Rationale provided for low sodium intake for blood pressure control, low added sugars for blood sugar stabilization, and low added fats and oils for coronary artery disease risk reduction and weight management.  Calorie Density  Clinical staff conducted group or individual video education with verbal and written material and guidebook.  Patient learns about calorie density and how it impacts the Pritikin Eating Plan. Knowing the characteristics of the food you choose will help you decide whether those foods will lead to weight gain or weight loss, and whether you want to consume more or less of them. Weight loss is usually a side effect of the Pritikin Eating Plan because of its focus on  low calorie-dense foods.  Label Reading  Clinical staff conducted group or individual video education with verbal and written material and guidebook.  Patient learns about the Pritikin recommended label reading guidelines and corresponding recommendations regarding calorie density, added sugars, sodium content, and whole grains.  Dining Out - Part 1  Clinical staff conducted group or individual video education with verbal and written material and guidebook.  Patient learns that restaurant meals can be sabotaging because they can be so high in calories, fat, sodium, and/or sugar. Patient learns recommended strategies on how to positively address this and avoid unhealthy pitfalls.  Facts on Fats  Clinical staff conducted group or individual video education with verbal and written material and guidebook.  Patient learns that lifestyle modifications can be just as effective, if not more so, as many medications for lowering your risk of heart disease. A Pritikin lifestyle can help to reduce your risk of inflammation and atherosclerosis (cholesterol build-up, or plaque, in the artery walls). Lifestyle interventions such as dietary choices and physical activity address the cause of atherosclerosis. A review of the types of fats and their impact on blood cholesterol levels, along with dietary recommendations to reduce fat intake is also included.  Nutrition Action Plan  Clinical staff conducted group or individual video education with verbal and written material and guidebook.  Patient learns how to incorporate Pritikin recommendations into their lifestyle. Recommendations include planning and keeping personal health goals in mind as an important part of their success.  Healthy Mind-Set    Healthy Minds, Bodies, Hearts  Clinical staff conducted group or individual video education with verbal and written material and guidebook.  Patient learns how to identify when they are stressed. Video will discuss  the impact of that stress, as well as the many benefits of stress management. Patient will also be introduced to stress management techniques. The way we think, act, and feel has an impact on our hearts.  How Our Thoughts Can Heal Our Hearts  Clinical staff conducted group or individual video education with verbal and written material and guidebook.  Patient learns that negative thoughts can cause depression and anxiety. This can result in negative lifestyle behavior and serious health problems. Cognitive behavioral therapy is an effective method to help control our thoughts in order to change and improve our emotional outlook.  Additional Videos:  Exercise    Improving Performance  Clinical staff conducted group or individual video education with verbal and written material and guidebook.  Patient learns to use a non-linear approach by alternating intensity levels and lengths of time spent exercising to help burn more calories and lose more body fat. Cardiovascular exercise helps improve heart health, metabolism, hormonal balance, blood sugar control, and recovery from fatigue. Resistance training improves strength, endurance, balance, coordination, reaction time, metabolism, and muscle  mass. Flexibility exercise improves circulation, posture, and balance. Seek guidance from your physician and exercise physiologist before implementing an exercise routine and learn your capabilities and proper form for all exercise.  Introduction to Yoga  Clinical staff conducted group or individual video education with verbal and written material and guidebook.  Patient learns about yoga, a discipline of the coming together of mind, breath, and body. The benefits of yoga include improved flexibility, improved range of motion, better posture and core strength, increased lung function, weight loss, and positive self-image. Yoga's heart health benefits include lowered blood pressure, healthier heart rate, decreased  cholesterol and triglyceride levels, improved immune function, and reduced stress. Seek guidance from your physician and exercise physiologist before implementing an exercise routine and learn your capabilities and proper form for all exercise.  Medical   Aging: Enhancing Your Quality of Life  Clinical staff conducted group or individual video education with verbal and written material and guidebook.  Patient learns key strategies and recommendations to stay in good physical health and enhance quality of life, such as prevention strategies, having an advocate, securing a [place], and keeping a list of medications and system for tracking them. It also discusses how to avoid risk for bone loss.  Biology of Weight Control  Clinical staff conducted group or individual video education with verbal and written material and guidebook.  Patient learns that weight gain occurs because we consume more calories than we burn (eating more, moving less). Even if your body weight is normal, you may have higher ratios of fat compared to muscle mass. Too much body fat puts you at increased risk for cardiovascular disease, heart attack, stroke, type 2 diabetes, and obesity-related cancers. In addition to exercise, following the [place] can help reduce your risk.  Decoding Lab Results  Clinical staff conducted group or individual video education with verbal and written material and guidebook.  Patient learns that lab test reflects one measurement whose values change over time and are influenced by many factors, including medication, stress, sleep, exercise, food, hydration, pre-existing medical conditions, and more. It is recommended to use the knowledge from this video to become more involved with your lab results and evaluate your numbers to speak with your doctor.   Diseases of Our Time - Overview  Clinical staff conducted group or individual video education with verbal  and written material and guidebook.  Patient learns that according to the CDC, 50% to 70% of chronic diseases (such as obesity, type 2 diabetes, elevated lipids, hypertension, and heart disease) are avoidable through lifestyle improvements including healthier food choices, listening to satiety cues, and increased physical activity.  Sleep Disorders Clinical staff conducted group or individual video education with verbal and written material and guidebook.  Patient learns how good quality and duration of sleep are important to overall health and well-being. Patient also learns about sleep disorders and how they impact health along with recommendations to address them, including discussing with a physician.  Nutrition  Dining Out - Part 2 Clinical staff conducted group or individual video education with verbal and written material and guidebook.  Patient learns how to plan ahead and communicate in order to maximize their dining experience in a healthy and nutritious manner. Included are recommended food choices based on the type of restaurant the patient is visiting.   Fueling a Best boy conducted group or individual video education with verbal and written material and guidebook.  There is  a strong connection between our food choices and our health. Diseases like obesity and type 2 diabetes are very prevalent and are in large-part due to lifestyle choices. The Pritikin Eating Plan provides plenty of food and hunger-curbing satisfaction. It is easy to follow, affordable, and helps reduce health risks.  Menu Workshop  Clinical staff conducted group or individual video education with verbal and written material and guidebook.  Patient learns that restaurant meals can sabotage health goals because they are often packed with calories, fat, sodium, and sugar. Recommendations include strategies to plan ahead and to communicate with the manager, chef, or server to help order a healthier  meal.  Planning Your Eating Strategy  Clinical staff conducted group or individual video education with verbal and written material and guidebook.  Patient learns about the [place] and its benefit of reducing the risk of disease. The [place] does not focus on calories. Instead, it emphasizes high-quality, nutrient-rich foods. By knowing the characteristics of the foods, we choose, we can determine their calorie density and make informed decisions.  Targeting Your Nutrition Priorities  Clinical staff conducted group or individual video education with verbal and written material and guidebook.  Patient learns that lifestyle habits have a tremendous impact on disease risk and progression. This video provides eating and physical activity recommendations based on your personal health goals, such as reducing LDL cholesterol, losing weight, preventing or controlling type 2 diabetes, and reducing high blood pressure.  Vitamins and Minerals  Clinical staff conducted group or individual video education with verbal and written material and guidebook.  Patient learns different ways to obtain key vitamins and minerals, including through a recommended healthy diet. It is important to discuss all supplements you take with your doctor.   Healthy Mind-Set    Smoking Cessation  Clinical staff conducted group or individual video education with verbal and written material and guidebook.  Patient learns that cigarette smoking and tobacco addiction pose a serious health risk which affects millions of people. Stopping smoking will significantly reduce the risk of heart disease, lung disease, and many forms of cancer. Recommended strategies for quitting are covered, including working with your doctor to develop a successful plan.  Culinary   Becoming a Financial trader conducted group or individual video education with verbal and written material and guidebook.  Patient learns  that cooking at home can be healthy, cost-effective, quick, and puts them in control. Keys to cooking healthy recipes will include looking at your recipe, assessing your equipment needs, planning ahead, making it simple, choosing cost-effective seasonal ingredients, and limiting the use of added fats, salts, and sugars.  Cooking - Breakfast and Snacks  Clinical staff conducted group or individual video education with verbal and written material and guidebook.  Patient learns how important breakfast is to satiety and nutrition through the entire day. Recommendations include key foods to eat during breakfast to help stabilize blood sugar levels and to prevent overeating at meals later in the day. Planning ahead is also a key component.  Cooking - Human resources officer conducted group or individual video education with verbal and written material and guidebook.  Patient learns eating strategies to improve overall health, including an approach to cook more at home. Recommendations include thinking of animal protein as a side on your plate rather than center stage and focusing instead on lower calorie dense options like vegetables, fruits, whole grains, and plant-based proteins, such as beans. Making sauces in large  quantities to freeze for later and leaving the skin on your vegetables are also recommended to maximize your experience.  Cooking - Healthy Salads and Dressing Clinical staff conducted group or individual video education with verbal and written material and guidebook.  Patient learns that vegetables, fruits, whole grains, and legumes are the foundations of the [place]. Recommendations include how to incorporate each of these in flavorful and healthy salads, and how to create homemade salad dressings. Proper handling of ingredients is also covered. Cooking - Soups and [employer] - Soups and Desserts Clinical staff conducted group or individual video education with  verbal and written material and guidebook.  Patient learns that Pritikin soups and desserts make for easy, nutritious, and delicious snacks and meal components that are low in sodium, fat, sugar, and calorie density, while high in vitamins, minerals, and filling fiber. Recommendations include simple and healthy ideas for soups and desserts.   Overview     The Pritikin Solution Program Overview Clinical staff conducted group or individual video education with verbal and written material and guidebook.  Patient learns that the results of the [place] Program have been documented in more than 100 articles published in peer-reviewed journals, and the benefits include reducing risk factors for (and, in some cases, even reversing) high cholesterol, high blood pressure, type 2 diabetes, obesity, and more! An overview of the three key pillars of the Pritikin Program will be covered: eating well, doing regular exercise, and having a healthy mind-set.  WORKSHOPS  Exercise: Exercise Basics: Building Your Action Plan Clinical staff led group instruction and group discussion with PowerPoint presentation and patient guidebook. To enhance the learning environment the use of posters, models and videos may be added. At the conclusion of this workshop, patients will comprehend the difference between physical activity and exercise, as well as the benefits of incorporating both, into their routine. Patients will understand the FITT (Frequency, Intensity, Time, and Type) principle and how to use it to build an exercise action plan. In addition, safety concerns and other considerations for exercise and cardiac rehab will be addressed by the presenter. The purpose of this lesson is to promote a comprehensive and effective weekly exercise routine in order to improve patients' overall level of fitness.   Managing Heart Disease: Your Path to a Healthier Heart Clinical staff led group instruction and group discussion with  PowerPoint presentation and patient guidebook. To enhance the learning environment the use of posters, models and videos may be added.At the conclusion of this workshop, patients will understand the anatomy and physiology of the heart. Additionally, they will understand how Pritikin's three pillars impact the risk factors, the progression, and the management of heart disease.  The purpose of this lesson is to provide a high-level overview of the heart, heart disease, and how the Pritikin lifestyle positively impacts risk factors.  Exercise Biomechanics Clinical staff led group instruction and group discussion with PowerPoint presentation and patient guidebook. To enhance the learning environment the use of posters, models and videos may be added. Patients will learn how the structural parts of their bodies function and how these functions impact their daily activities, movement, and exercise. Patients will learn how to promote a neutral spine, learn how to manage pain, and identify ways to improve their physical movement in order to promote healthy living. The purpose of this lesson is to expose patients to common physical limitations that impact physical activity. Participants will learn practical ways to adapt and manage aches and pains,  and to minimize their effect on regular exercise. Patients will learn how to maintain good posture while sitting, walking, and lifting.  Balance Training and Fall Prevention  Clinical staff led group instruction and group discussion with PowerPoint presentation and patient guidebook. To enhance the learning environment the use of posters, models and videos may be added. At the conclusion of this workshop, patients will understand the importance of their sensorimotor skills (vision, proprioception, and the vestibular system) in maintaining their ability to balance as they age. Patients will apply a variety of balancing exercises that are appropriate for their  current level of function. Patients will understand the common causes for poor balance, possible solutions to these problems, and ways to modify their physical environment in order to minimize their fall risk. The purpose of this lesson is to teach patients about the importance of maintaining balance as they age and ways to minimize their risk of falling.  WORKSHOPS   Nutrition:  Fueling a Scientist, research (physical sciences) led group instruction and group discussion with PowerPoint presentation and patient guidebook. To enhance the learning environment the use of posters, models and videos may be added. Patients will review the foundational principles of the [place] and understand what constitutes a serving size in each of the food groups. Patients will also learn Pritikin-friendly foods that are better choices when away from home and review make-ahead meal and snack options. Calorie density will be reviewed and applied to three nutrition priorities: weight maintenance, weight loss, and weight gain. The purpose of this lesson is to reinforce (in a group setting) the key concepts around what patients are recommended to eat and how to apply these guidelines when away from home by planning and selecting Pritikin-friendly options. Patients will understand how calorie density may be adjusted for different weight management goals.  Mindful Eating  Clinical staff led group instruction and group discussion with PowerPoint presentation and patient guidebook. To enhance the learning environment the use of posters, models and videos may be added. Patients will briefly review the concepts of the [place] and the importance of low-calorie dense foods. The concept of mindful eating will be introduced as well as the importance of paying attention to internal hunger signals. Triggers for non-hunger eating and techniques for dealing with triggers will be explored. The purpose of this lesson is to provide  patients with the opportunity to review the basic principles of the [place], discuss the value of eating mindfully and how to measure internal cues of hunger and fullness using the Hunger Scale. Patients will also discuss reasons for non-hunger eating and learn strategies to use for controlling emotional eating.  Targeting Your Nutrition Priorities Clinical staff led group instruction and group discussion with PowerPoint presentation and patient guidebook. To enhance the learning environment the use of posters, models and videos may be added. Patients will learn how to determine their genetic susceptibility to disease by reviewing their family history. Patients will gain insight into the importance of diet as part of an overall healthy lifestyle in mitigating the impact of genetics and other environmental insults. The purpose of this lesson is to provide patients with the opportunity to assess their personal nutrition priorities by looking at their family history, their own health history and current risk factors. Patients will also be able to discuss ways of prioritizing and modifying the [place] for their highest risk areas  Menu  Clinical staff led group instruction and group discussion with PowerPoint presentation  and patient guidebook. To enhance the learning environment the use of posters, models and videos may be added. Using menus brought in from [employer], or printed from [employer], patients will apply the [place] dining out guidelines that were presented in the [employer] video. Patients will also be able to practice these guidelines in a variety of provided scenarios. The purpose of this lesson is to provide patients with the opportunity to practice hands-on learning of the [place] with actual menus and practice scenarios.  Label Reading Clinical staff led group instruction and group discussion with PowerPoint  presentation and patient guidebook. To enhance the learning environment the use of posters, models and videos may be added. Patients will review and discuss the Pritikin label reading guidelines presented in Pritikin's Label Reading Educational series video. Using fool labels brought in from local grocery stores and markets, patients will apply the label reading guidelines and determine if the packaged food meet the Pritikin guidelines. The purpose of this lesson is to provide patients with the opportunity to review, discuss, and practice hands-on learning of the Pritikin Label Reading guidelines with actual packaged food labels. [place] Workshops are designed to teach patients ways to prepare quick, simple, and affordable recipes at home. The importance of nutrition's role in chronic disease risk reduction is reflected in its emphasis in the overall Pritikin program. By learning how to prepare essential core Pritikin Eating Plan recipes, patients will increase control over what they eat; be able to customize the flavor of foods without the use of added salt, sugar, or fat; and improve the quality of the food they consume. By learning a set of core recipes which are easily assembled, quickly prepared, and affordable, patients are more likely to prepare more healthy foods at home. These workshops focus on convenient breakfasts, simple entres, side dishes, and desserts which can be prepared with minimal effort and are consistent with nutrition recommendations for cardiovascular risk reduction. Cooking [employer] are taught by a Engineer, materials (RD) who has been trained by the [employer]. The chef or RD has a clear understanding of the importance of minimizing - if not completely eliminating - added fat, sugar, and sodium in recipes. Throughout the series of [place] Workshop sessions, patients will learn about healthy ingredients and  efficient methods of cooking to build confidence in their capability to prepare    Cooking School weekly topics:  Adding Flavor- Sodium-Free  Fast and Healthy Breakfasts  Powerhouse Plant-Based Proteins  Satisfying Salads and Dressings  Simple Sides and Sauces  International Cuisine-Spotlight on the [place] Zones  Delicious Desserts  Savory Soups  Efficiency Cooking - Meals in a Snap  Tasty Appetizers and Snacks  Comforting Weekend Breakfasts  One-Pot Wonders   Fast Evening Meals  Easy [place] (Psychosocial): New Thoughts, New Behaviors Clinical staff led group instruction and group discussion with PowerPoint presentation and patient guidebook. To enhance the learning environment the use of posters, models and videos may be added. Patients will learn and practice techniques for developing effective health and lifestyle goals. Patients will be able to effectively apply the goal setting process learned to develop at least one new personal goal.  The purpose of this lesson is to expose patients to a new skill set of behavior modification techniques such as techniques setting SMART goals, overcoming barriers, and achieving new thoughts and new  behaviors.  Managing Moods and Relationships Clinical staff led group instruction and group discussion with PowerPoint presentation and patient guidebook. To enhance the learning environment the use of posters, models and videos may be added. Patients will learn how emotional and chronic stress factors can impact their health and relationships. They will learn healthy ways to manage their moods and utilize positive coping mechanisms. In addition, ICR patients will learn ways to improve communication skills. The purpose of this lesson is to expose patients to ways of understanding how one's mood and health are intimately connected. Developing a healthy outlook can help build positive  relationships and connections with others. Patients will understand the importance of utilizing effective communication skills that include actively listening and being heard. They will learn and understand the importance of the "4 Cs" and especially Connections in fostering of a Healthy Mind-Set.  Healthy Sleep for a Healthy Heart Clinical staff led group instruction and group discussion with PowerPoint presentation and patient guidebook. To enhance the learning environment the use of posters, models and videos may be added. At the conclusion of this workshop, patients will be able to demonstrate knowledge of the importance of sleep to overall health, well-being, and quality of life. They will understand the symptoms of, and treatments for, common sleep disorders. Patients will also be able to identify daytime and nighttime behaviors which impact sleep, and they will be able to apply these tools to help manage sleep-related challenges. The purpose of this lesson is to provide patients with a general overview of sleep and outline the importance of quality sleep. Patients will learn about a few of the most common sleep disorders. Patients will also be introduced to the concept of "sleep hygiene," and discover ways to self-manage certain sleeping problems through simple daily behavior changes. Finally, the workshop will motivate patients by clarifying the links between quality sleep and their goals of heart-healthy living.   Recognizing and Reducing Stress Clinical staff led group instruction and group discussion with PowerPoint presentation and patient guidebook. To enhance the learning environment the use of posters, models and videos may be added. At the conclusion of this workshop, patients will be able to understand the types of stress reactions, differentiate between acute and chronic stress, and recognize the impact that chronic stress has on their health. They will also be able to apply different coping  mechanisms, such as reframing negative self-talk. Patients will have the opportunity to practice a variety of stress management techniques, such as deep abdominal breathing, progressive muscle relaxation, and/or guided imagery.  The purpose of this lesson is to educate patients on the role of stress in their lives and to provide healthy techniques for coping with it.  Learning Barriers/Preferences:  Learning Barriers/Preferences - 01/06/22 1006       Learning Barriers/Preferences   Learning Barriers None    Learning Preferences Verbal Instruction;Written Material             Education Topics:  Knowledge Questionnaire Score:   Core Components/Risk Factors/Patient Goals at Admission:  Personal Goals and Risk Factors at Admission - 01/06/22 0848       Core Components/Risk Factors/Patient Goals on Admission    Weight Management Yes;Obesity;Weight Loss    Intervention Weight Management/Obesity: Establish reasonable short term and long term weight goals.;Obesity: Provide education and appropriate resources to help participant work on and attain dietary goals.    Admit Weight 258 lb 3.2 oz (117.1 kg)    Goal Weight: Short Term 250 lb (113.4 kg)  Goal Weight: Long Term 220 lb (99.8 kg)    Expected Outcomes Short Term: Continue to assess and modify interventions until short term weight is achieved;Long Term: Adherence to nutrition and physical activity/exercise program aimed toward attainment of established weight goal;Weight Loss: Understanding of general recommendations for a balanced deficit meal plan, which promotes 1-2 lb weight loss per week and includes a negative energy balance of [phone] kcal/d    Hypertension Yes    Intervention Provide education on lifestyle modifcations including regular physical activity/exercise, weight management, moderate sodium restriction and increased consumption of fresh fruit, vegetables, and low fat dairy, alcohol moderation, and smoking  cessation.;Monitor prescription use compliance.    Expected Outcomes Short Term: Continued assessment and intervention until BP is < 140/43m HG in hypertensive participants. < 130/834mHG in hypertensive participants with diabetes, heart failure or chronic kidney disease.;Long Term: Maintenance of blood pressure at goal levels.    Lipids Yes    Intervention Provide education and support for participant on nutrition & aerobic/resistive exercise along with prescribed medications to achieve LDL <7064mHDL >56m69m  Expected Outcomes Short Term: Participant states understanding of desired cholesterol values and is compliant with medications prescribed. Participant is following exercise prescription and nutrition guidelines.;Long Term: Cholesterol controlled with medications as prescribed, with individualized exercise RX and with personalized nutrition plan. Value goals: LDL < 70mg41mL > 40 mg.             Core Components/Risk Factors/Patient Goals Review:   Goals and Risk Factor Review     Row Name 01/12/22 1442 01/21/22 1226 02/17/22 0959 03/10/22 1315       Core Components/Risk Factors/Patient Goals Review   Personal Goals Review Weight Management/Obesity;Hypertension;Lipids Weight Management/Obesity;Hypertension;Lipids Weight Management/Obesity;Hypertension;Lipids Weight Management/Obesity;Hypertension;Lipids    Review Patient started intensive cardiac rehab today, tolerated well, vss, c/o 4/10 chronic knee pain. WilliHalecompleted 6 exercise and some educational sessions.  WilliMariarogressing  well with workloads and increased MET. Seen by neuro for leg weakness and drooling.  Weight measurement show a decrease of 3 kg since begining cardiac rehab.  Pt with normal and appopriate BP readings and is compliant with statin therapy. WilliHoracecompleted 12 exercise/education sessions.  WilliReageninue to make progress with weight loss. WilliCorneilus 4 KG since beginning Cardiac Rehab a bit  discouraged as the weight loss has been slow. Pt exercies is somewhat limited as he had an injection to his left knee on 9/6.  Encouraged pt that this rate of weight loss is sustainable.  WilliMccauleyts he struggles with the Pritikin diet restrictions however he has made some changes.  pt is on optimal lipid management with statin therapy along with fish oil supplements, nutriton and exercise.  Vital signs remain well within normal limts. WilliRaymoncompleted 24 exercise/education sessions anticipate graduation on 03/13/22.  WilliJasiriinue to make progress with weight loss. WilliDeshay 5.4 KG since beginning Cardiac Rehab although the rate of weight loss is not what he would like he understands this weight loss is sustainable. WilliLadislausn optimal lipid management with statin therapy along with fish oil supplements, nutriton and exercise.  Vital signs remain well within normal limts.    Expected Outcomes Patient will maintain a healthy lifestyle, as he is provided the knowledge and tools neccessary to complete goal; he will increase exercise stamina during ICR program. Patient will maintain a healthy lifestyle, as he is provided the knowledge and tools neccessary to complete goal; he will increase exercise  stamina during [employer] program. [place]lin will adopt a heart healthy lifestyle in accordance to the three pillars learned with Pritikin Intensive Cardiac Rehab: Exercise, heart healthy nutrition and healthy mind set. Aleksey will adopt a heart healthy lifestyle in accordance to the three pillars learned with Pritikin Intensive Cardiac Rehab: Exercise, heart healthy nutrition and healthy mind set.             Core Components/Risk Factors/Patient Goals at Discharge (Final Review):   Goals and Risk Factor Review - 03/10/22 1315       Core Components/Risk Factors/Patient Goals Review   Personal Goals Review Weight Management/Obesity;Hypertension;Lipids    Review Abyan has completed 24  exercise/education sessions anticipate graduation on 03/13/22.  Christine continue to make progress with weight loss. Ellie lost 5.4 KG since beginning Cardiac Rehab although the rate of weight loss is not what he would like he understands this weight loss is sustainable. Daoud is on optimal lipid management with statin therapy along with fish oil supplements, nutriton and exercise.  Vital signs remain well within normal limts.    Expected Outcomes Oluwadarasimi will adopt a heart healthy lifestyle in accordance to the three pillars learned with Pritikin Intensive Cardiac Rehab: Exercise, heart healthy nutrition and healthy mind set.             ITP Comments:  ITP Comments     Row Name 01/06/22 0834 01/06/22 0944 01/21/22 1021 02/20/22 1322 03/10/22 1320   ITP Comments Medical Director- Dr. Fransico Him, MD Dr. Fransico Him Medical Director: Introduction to Pritikin Education program/ Intensive Cardiac Rehab, initial Pritikin orientation pack reviewed 30 day ITP Review.  Baylin is off to a great start 30 day ITP Review 30 day ITP Review. Anticipate graduation on 03/13/22.            Comments: Pt is making expected progress toward personal goals after completing 24 sessions. Recommend continued exercise and life style modification education including  stress management and relaxation techniques to decrease cardiac risk profile.

## 2022-03-10 NOTE — Progress Notes (Signed)
Cardiac Individual Treatment Plan  Patient Details  Name: Daniel Whitaker MRN: [id number] Date of Birth: [birth year] Referring Provider:   Flowsheet Row INTENSIVE CARDIAC REHAB ORIENT from 01/06/2022 in [facility] [facility]  Referring Provider Minus Breeding, MD       Initial Encounter Date:  [place] from 01/06/2022 in [place]  Date 01/06/22       Visit Diagnosis: 10/22/21 DES LAD  Patient's Home Medications on Admission:  Current Outpatient Medications:    aspirin EC 81 MG tablet, Take 1 tablet (81 mg total) by mouth daily. Swallow whole., Disp: 30 tablet, Rfl: 0   brimonidine (ALPHAGAN) 0.2 % ophthalmic solution, Place 1 drop into both eyes in the morning and at bedtime., Disp: , Rfl:    carvedilol (COREG) 3.125 MG tablet, Take 1 tablet (3.125 mg total) by mouth 2 (two) times daily., Disp: 60 tablet, Rfl: 4   cetirizine (ZYRTEC) 10 MG tablet, Take 10 mg by mouth every morning., Disp: , Rfl:    Cholecalciferol (VITAMIN D) 2000 UNITS CAPS, Take 2,000 Units by mouth daily., Disp: , Rfl:    clopidogrel (PLAVIX) 75 MG tablet, Take 1 tablet (75 mg total) by mouth daily., Disp: 90 tablet, Rfl: 1   Cyanocobalamin (VITAMIN B-12) 5000 MCG TBDP, Take 10,000 mcg by mouth every morning., Disp: , Rfl:    dorzolamide-timolol (COSOPT) 22.3-6.8 MG/ML ophthalmic solution, Place 1 drop into the left eye 2 (two) times daily., Disp: , Rfl:    ketoconazole (NIZORAL) 2 % cream, Apply 1 application  topically daily as needed for irritation., Disp: , Rfl:    latanoprost (XALATAN) 0.005 % ophthalmic solution, Place 1 drop into both eyes at bedtime., Disp: , Rfl:    lidocaine (XYLOCAINE) 5 % ointment, Apply 1 application topically 2 (two) times daily as needed. (Patient taking differently: Apply 1 application  topically 2 (two) times daily as needed (neuropathy).), Disp: 35.44 g, Rfl: 3   MAGNESIUM PO, Take 400 mg by  mouth daily., Disp: , Rfl:    Multiple Vitamins-Minerals (CENTRUM) tablet, Take 1 tablet by mouth daily. 50 +, Disp: , Rfl:    nitroGLYCERIN (NITROSTAT) 0.4 MG SL tablet, Place 1 tablet (0.4 mg total) under the tongue every 5 (five) minutes as needed., Disp: 25 tablet, Rfl: 2   Omega-3 Fatty Acids (FISH OIL) 1200 MG CAPS, Take 3,600 mg by mouth daily. 3, Disp: , Rfl:    pantoprazole (PROTONIX) 40 MG tablet, Take 40 mg by mouth daily. , Disp: , Rfl:    pregabalin (LYRICA) 100 MG capsule, One in the am, one at noon and 2 tabs at night, Disp: 120 capsule, Rfl: 5   rOPINIRole (REQUIP) 2 MG tablet, Take 1 tablet at 5 Pm and 1/2 tablet at bedtime, Disp: 135 tablet, Rfl: 3   rosuvastatin (CRESTOR) 40 MG tablet, Take 1 tablet (40 mg total) by mouth daily., Disp: 90 tablet, Rfl: 1   sildenafil (REVATIO) 20 MG tablet, Take 20-100 mg by mouth daily as needed (ED)., Disp: , Rfl:   Past Medical History: Past Medical History:  Diagnosis Date   Arthritis    Cataract    removed bilaterally    Coronary artery disease    Erectile dysfunction    Esophageal reflux    Glaucoma    Hypertriglyceridemia    Impaired fasting glucose    MGUS (monoclonal gammopathy of unknown significance)    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  Nocturnal leg cramps 03/11/2021   Nontoxic uninodular goiter    Obesity    Peripheral neuropathy    RLS (restless legs syndrome)    Spinal stenosis    Unspecified deficiency anemia 07/11/2013    Tobacco Use: Social History   Tobacco Use  Smoking Status Former   Types: Cigarettes   Quit date: 06/01/1984   Years since quitting: 37.7  Smokeless Tobacco Never    Labs: Review Flowsheet       Latest Ref Rng & Units 04/15/2013  Labs for ITP Cardiac and Pulmonary Rehab  Hemoglobin A1c <5.7 % 5.2     Capillary Blood Glucose: Lab Results  Component Value Date   GLUCAP 112 (H) 04/18/2013   GLUCAP 102 (H) 04/18/2013   GLUCAP 111 (H) 04/17/2013   GLUCAP 119 (H)  04/17/2013   GLUCAP 106 (H) 04/17/2013     Exercise Target Goals: Exercise Program Goal: Individual exercise prescription set using results from initial 6 min walk test and THRR while considering  patient's activity barriers and safety.   Exercise Prescription Goal: Initial exercise prescription builds to 30-45 minutes a day of aerobic activity, 2-3 days per week.  Home exercise guidelines will be given to patient during program as part of exercise prescription that the participant will acknowledge.  Activity Barriers & Risk Stratification:  Activity Barriers & Cardiac Risk Stratification - 01/06/22 0903       Activity Barriers & Cardiac Risk Stratification   Activity Barriers Arthritis;Back Problems;History of Falls;Balance Concerns;Other (comment)    Comments Neuropathy in both feet, "bad" left knee, gets injections; physical therapy for recent left hand injury; hx crushed vertebrae, back pain, gets injections.    Cardiac Risk Stratification Low             6 Minute Walk:  6 Minute Walk     Row Name 01/06/22 0914         6 Minute Walk   Phase Initial     Distance 1766 feet     Walk Time 6 minutes     # of Rest Breaks 0     MPH 3.34     METS 3.4     RPE 12     Perceived Dyspnea  1     VO2 Peak 11.91     Symptoms Yes (comment)     Comments Patient c/o lower, left leg pain, 4/10 on the pain scale; mild SOB, RPD=1.     Resting HR 71 bpm     Resting BP 133/70     Resting Oxygen Saturation  98 %     Exercise Oxygen Saturation  during 6 min walk 95 %     Max Ex. HR 104 bpm     Max Ex. BP 178/60     2 Minute Post BP 166/80              Oxygen Initial Assessment:   Oxygen Re-Evaluation:   Oxygen Discharge (Final Oxygen Re-Evaluation):   Initial Exercise Prescription:  Initial Exercise Prescription - 01/06/22 0900       Date of Initial Exercise RX and Referring Provider   Date 01/06/22    Referring Provider Minus Breeding, MD    Expected Discharge  Date 03/13/22      Recumbant Bike   Level 1.5    Minutes 15    METs 2      NuStep   Level 2    SPM 85    Minutes 15  METs 2      Prescription Details   Frequency (times per week) 3    Duration Progress to 30 minutes of continuous aerobic without signs/symptoms of physical distress      Intensity   THRR 40-80% of Max Heartrate 58-116    Ratings of Perceived Exertion 11-13    Perceived Dyspnea 0-4      Progression   Progression Continue to progress workloads to maintain intensity without signs/symptoms of physical distress.      Resistance Training   Training Prescription Yes    Weight 3 lbs    Reps 10-15             Perform Capillary Blood Glucose checks as needed.  Exercise Prescription Changes:   Exercise Prescription Changes     Row Name 01/12/22 [phone] 01/28/22 0823 02/04/22 0936 02/20/22 0821       Response to Exercise   Blood Pressure (Admit) 128/64 122/62 124/70 104/58    Blood Pressure (Exercise) 140/64 138/68 130/64 140/70    Blood Pressure (Exit) 114/68 128/64 106/62 112/60    Heart Rate (Admit) 66 bpm 64 bpm 68 bpm 67 bpm    Heart Rate (Exercise) 82 bpm 86 bpm 91 bpm 97 bpm    Heart Rate (Exit) 63 bpm 64 bpm 69 bpm 66 bpm    Rating of Perceived Exertion (Exercise) 10.5 11.5 11.5 12.5    Perceived Dyspnea (Exercise) 0 0 0 0    Symptoms 4/10 chronic L knee pain Mild low back pain None None    Comments First day in CRP2 program Reviewed MET's, goals and home ExRx Reviewed MET's, Reviewed METs and goals    Duration Progress to 30 minutes of  aerobic without signs/symptoms of physical distress Progress to 30 minutes of  aerobic without signs/symptoms of physical distress Progress to 30 minutes of  aerobic without signs/symptoms of physical distress Progress to 30 minutes of  aerobic without signs/symptoms of physical distress    Intensity THRR unchanged THRR unchanged THRR unchanged THRR unchanged      Progression   Progression Continue to progress  workloads to maintain intensity without signs/symptoms of physical distress. Continue to progress workloads to maintain intensity without signs/symptoms of physical distress. Continue to progress workloads to maintain intensity without signs/symptoms of physical distress. Continue to progress workloads to maintain intensity without signs/symptoms of physical distress.    Average METs 1.7 2.05 2.45 3.03      Resistance Training   Training Prescription Yes No No Yes    Weight 3 lbs -- -- 5 lbs wts    Reps 10-15 -- -- 10-15    Time 10 Minutes -- -- 10 Minutes      Recumbant Bike   Level 1._0 Minutes _1 METs 1.6 1.7 2 2.6      NuStep   Level 2 2._2 SPM 85 85 85 85    Minutes _3 METs 1.8 2.4 2.9 3.4      Home Exercise Plan   Plans to continue exercise at -- Home (comment) Home (comment) Home (comment)    Frequency -- Add 1 additional day to program exercise sessions. Add 1 additional day to program exercise sessions. Add 1 additional day to program exercise sessions.    Initial Home Exercises Provided -- 01/28/22 01/28/22 01/28/22  Exercise Comments:   Exercise Comments     Row Name 01/28/22 [phone] 02/04/22 0939 02/20/22 9977       Exercise Comments Reviewed MET's, goals and home ExRx. Pt tolerated exercise well with an average MET level of 2.05. Pt feels good about his wt loss goal and will continue to work on it. Pt will continue to exercise on his own by walking, treadmill and swimming 1-2 days a week. Pt is also very active in his normal lifestyle and has been working in the yard often Reviewed MET's. Pt tolerated exercise well with an average MET level of 2.45. Overall pt is feeling good and plans to add resistance to the recubent bike on friday. Reviewed MET's, and goals. Pt tolerated exercise well with an average MET level of 3.03. Pt is doing well with his primary goal of wt loss and is continuing to lose wt. He also states that  he is feeling better and has less shortness of breath and can tell a difference in his endurance              Exercise Goals and Review:   Exercise Goals     Row Name 01/06/22 0846             Exercise Goals   Increase Physical Activity Yes       Intervention Provide advice, education, support and counseling about physical activity/exercise needs.;Develop an individualized exercise prescription for aerobic and resistive training based on initial evaluation findings, risk stratification, comorbidities and participant's personal goals.       Expected Outcomes Short Term: Attend rehab on a regular basis to increase amount of physical activity.;Long Term: Exercising regularly at least 3-5 days a week.;Long Term: Add in home exercise to make exercise part of routine and to increase amount of physical activity.       Increase Strength and Stamina Yes       Intervention Provide advice, education, support and counseling about physical activity/exercise needs.;Develop an individualized exercise prescription for aerobic and resistive training based on initial evaluation findings, risk stratification, comorbidities and participant's personal goals.       Expected Outcomes Short Term: Increase workloads from initial exercise prescription for resistance, speed, and METs.;Short Term: Perform resistance training exercises routinely during rehab and add in resistance training at home;Long Term: Improve cardiorespiratory fitness, muscular endurance and strength as measured by increased METs and functional capacity (6MWT)       Able to understand and use rate of perceived exertion (RPE) scale Yes       Intervention Provide education and explanation on how to use RPE scale       Expected Outcomes Short Term: Able to use RPE daily in rehab to express subjective intensity level;Long Term:  Able to use RPE to guide intensity level when exercising independently       Knowledge and understanding of Target Heart  Rate Range (THRR) Yes       Intervention Provide education and explanation of THRR including how the numbers were predicted and where they are located for reference       Expected Outcomes Short Term: Able to state/look up THRR;Long Term: Able to use THRR to govern intensity when exercising independently;Short Term: Able to use daily as guideline for intensity in rehab       Able to check pulse independently Yes       Intervention Provide education and demonstration on how to check pulse in carotid and radial arteries.;Review the importance of  being able to check your own pulse for safety during independent exercise       Expected Outcomes Short Term: Able to explain why pulse checking is important during independent exercise;Long Term: Able to check pulse independently and accurately       Understanding of Exercise Prescription Yes       Intervention Provide education, explanation, and written materials on patient's individual exercise prescription       Expected Outcomes Short Term: Able to explain program exercise prescription;Long Term: Able to explain home exercise prescription to exercise independently                Exercise Goals Re-Evaluation :  Exercise Goals Re-Evaluation     Row Name 01/12/22 0850 01/28/22 0826 02/04/22 0938 02/20/22 0824       Exercise Goal Re-Evaluation   Exercise Goals Review Increase Physical Activity;Increase Strength and Stamina;Able to understand and use rate of perceived exertion (RPE) scale;Knowledge and understanding of Target Heart Rate Range (THRR);Understanding of Exercise Prescription Increase Physical Activity;Increase Strength and Stamina;Able to understand and use rate of perceived exertion (RPE) scale;Knowledge and understanding of Target Heart Rate Range (THRR);Understanding of Exercise Prescription Increase Physical Activity;Increase Strength and Stamina;Able to understand and use rate of perceived exertion (RPE) scale;Knowledge and  understanding of Target Heart Rate Range (THRR);Understanding of Exercise Prescription Increase Physical Activity;Increase Strength and Stamina;Able to understand and use rate of perceived exertion (RPE) scale;Knowledge and understanding of Target Heart Rate Range (THRR);Understanding of Exercise Prescription    Comments Pt first day in theCRP2 program. Pt tolerated exercise well with 4/10 chronic left knee pain and an average MET level of 1.7. Pt is learning his THRR, RPE and ExRx. Reviewed MET's, goals and home ExRx. Pt tolerated exercise well with an average MET level of 2.05. Pt feels good about his wt loss goal and will continue to work on it. Pt will continue to exercise on his own by walking, treadmill and swimming 1-2 days a week. Pt is also very active in his normal lifestyle and has been working in the yard often Reviewed MET's. Pt tolerated exercise well with an average MET level of 2.45. Overall pt is feeling good and plans to add resistance to the recubent bike on friday. Reviewed MET's, and goals. Pt tolerated exercise well with an average MET level of 3.03. Pt is doing well with his primary goal of wt loss and is continuing to lose wt. He also states that he is feeling better and has less shortness of breath and can tell a difference in his endurance    Expected Outcomes Will continue to monitor pt and progress workloads as tolerated without sign or symptom Pt will continue to exercise on his own. Will continue to monitor pt and progress workloads as tolerated without sign or symptom Will continue to monitor pt and progress workloads as tolerated without sign or symptom Will continue to monitor pt and progress workloads as tolerated without sign or symptom             Discharge Exercise Prescription (Final Exercise Prescription Changes):  Exercise Prescription Changes - 02/20/22 0821       Response to Exercise   Blood Pressure (Admit) 104/58    Blood Pressure (Exercise) 140/70     Blood Pressure (Exit) 112/60    Heart Rate (Admit) 67 bpm    Heart Rate (Exercise) 97 bpm    Heart Rate (Exit) 66 bpm    Rating of Perceived Exertion (Exercise) 12.5  Perceived Dyspnea (Exercise) 0    Symptoms None    Comments Reviewed METs and goals    Duration Progress to 30 minutes of  aerobic without signs/symptoms of physical distress    Intensity THRR unchanged      Progression   Progression Continue to progress workloads to maintain intensity without signs/symptoms of physical distress.    Average METs 3.03      Resistance Training   Training Prescription Yes    Weight 5 lbs wts    Reps 10-15    Time 10 Minutes      Recumbant Bike   Level 3    Minutes 15    METs 2.6      NuStep   Level 5    SPM 85    Minutes 15    METs 3.4      Home Exercise Plan   Plans to continue exercise at Home (comment)    Frequency Add 1 additional day to program exercise sessions.    Initial Home Exercises Provided 01/28/22             Nutrition:  Target Goals: Understanding of nutrition guidelines, daily intake of sodium '1500mg'$ , cholesterol '200mg'$ , calories 30% from fat and 7% or less from saturated fats, daily to have 5 or more servings of fruits and vegetables.  Biometrics:  Pre Biometrics - 01/06/22 0834       Pre Biometrics   Waist Circumference 48 inches    Hip Circumference 47 inches    Waist to Hip Ratio 1.02 %    Triceps Skinfold 20 mm    % Body Fat 35.1 %    Grip Strength 34 kg    Flexibility --   Not performed, issues with back pain.   Single Leg Stand 1.5 seconds              Nutrition Therapy Plan and Nutrition Goals:  Nutrition Therapy & Goals - 02/11/22 1646       Nutrition Therapy   Diet Heart Healthy Diet    Drug/Food Interactions Statins/Certain Fruits      Personal Nutrition Goals   Nutrition Goal Patient to choose a daily variety of fruits, vegetables, whole grains, lean protein, nonfat/low fat dairy as part of heart healthy lifestyle     Personal Goal #2 Patient to limit sodium intake to '1500mg'$  daily    Personal Goal #3 Patient to identify and limit daily intake of saturated fat, trans fat, sodium, and refined carbohydrates    Comments Goals in progress. Patient continues to attend the [employer] education classes. He is down 6.6# since starting with our program.      Intervention Plan   Intervention Prescribe, educate and counsel regarding individualized specific dietary modifications aiming towards targeted core components such as weight, hypertension, lipid management, diabetes, heart failure and other comorbidities.;Nutrition handout(s) given to patient.    Expected Outcomes Short Term Goal: Understand basic principles of dietary content, such as calories, fat, sodium, cholesterol and nutrients.;Long Term Goal: Adherence to prescribed nutrition plan.             Nutrition Assessments:  MEDIFICTS Score Key: ?70 Need to make dietary changes  40-70 Heart Healthy Diet ? 40 Therapeutic Level Cholesterol Diet    Picture Your Plate Scores: <16 Unhealthy dietary pattern with much room for improvement. 41-50 Dietary pattern unlikely to meet recommendations for good health and room for improvement. 51-60 More healthful dietary pattern, with some room for improvement.  >60 Healthy dietary pattern,  although there may be some specific behaviors that could be improved.    Nutrition Goals Re-Evaluation:  Nutrition Goals Re-Evaluation     [place] Name 01/12/22 1630 02/11/22 1646           Goals   Current Weight 255 lb 15.3 oz (116.1 kg) 251 lb 8.7 oz (114.1 kg)      Comment A1c normal, LDL 112 no new labs at this time.      Expected Outcome -- Goals in progress. Patient continues to attend the [employer] education classes. He is down 6.6# since starting with our program.               Nutrition Goals Re-Evaluation:  Nutrition Goals Re-Evaluation     [place] Name 01/12/22 1630 02/11/22 1646           Goals    Current Weight 255 lb 15.3 oz (116.1 kg) 251 lb 8.7 oz (114.1 kg)      Comment A1c normal, LDL 112 no new labs at this time.      Expected Outcome -- Goals in progress. Patient continues to attend the [employer] education classes. He is down 6.6# since starting with our program.               Nutrition Goals Discharge (Final Nutrition Goals Re-Evaluation):  Nutrition Goals Re-Evaluation - 02/11/22 1646       Goals   Current Weight 251 lb 8.7 oz (114.1 kg)    Comment no new labs at this time.    Expected Outcome Goals in progress. Patient continues to attend the [employer] education classes. He is down 6.6# since starting with our program.             Psychosocial: Target Goals: Acknowledge presence or absence of significant depression and/or stress, maximize coping skills, provide positive support system. Participant is able to verbalize types and ability to use techniques and skills needed for reducing stress and depression.  Initial Review & Psychosocial Screening:  Initial Psych Review & Screening - 01/06/22 1031       Initial Review   Current issues with None Identified      Family Dynamics   Good Support System? Yes    Comments Patient reports good support system with his wife, 2 children and his grandchildren.      Barriers   Psychosocial barriers to participate in program There are no identifiable barriers or psychosocial needs.             Quality of Life Scores:  Quality of Life - 01/06/22 1012       Quality of Life   Select Quality of Life      Quality of Life Scores   Health/Function Pre 24.4 %    Socioeconomic Pre 28.29 %    Psych/Spiritual Pre 30 %    Family Pre 28.8 %    GLOBAL Pre 27 %            Scores of 19 and below usually indicate a poorer quality of life in these areas.  A difference of  2-3 points is a clinically meaningful difference.  A difference of 2-3 points in the total score of the Quality of Life Index has been associated  with significant improvement in overall quality of life, self-image, physical symptoms, and general health in studies assessing change in quality of life.  PHQ-9: Review Flowsheet       01/06/2022 01/22/2014  Depression screen PHQ 2/9  Decreased Interest 0 0  Down, Depressed, Hopeless 0 0  PHQ - 2 Score 0 0   Interpretation of Total Score  Total Score Depression Severity:  1-4 = Minimal depression, 5-9 = Mild depression, 10-14 = Moderate depression, 15-19 = Moderately severe depression, 20-27 = Severe depression   Psychosocial Evaluation and Intervention:  Psychosocial Evaluation - 01/21/22 1224       Psychosocial Evaluation & Interventions   Interventions Stress management education;Relaxation education;Encouraged to exercise with the program and follow exercise prescription    Comments Tuck feels supported by his family  in his efforts to adopt a heart healthy lifestyle.  No identified barriers to participating in cardiac rehab    Expected Outcomes Tryton will continue to be supported by his family and no reportable barriers to participating in cardiac rehab    Continue Psychosocial Services  No Follow up required             Psychosocial Re-Evaluation:  Psychosocial Re-Evaluation     [place] Name 02/17/22 0951 03/10/22 1310           Psychosocial Re-Evaluation   Current issues with None Identified None Identified      Comments Elizabeth is pleased with the progress he has made thus far particular with weight loss. Continues to feel supported by his family in his efforts to asopt a heart healthy lifestyle particular with the nutrition aspect. Haydyn is pleased with the progress he has made thus far particular with weight loss although it does fluctuate up and down. Continues to feel supported by his family in his efforts to asopt a heart healthy lifestyle particular with the nutrition aspect. Looking forward to graduating this friday.      Expected Outcomes Lawyer will  continue to display a hopeful and positive outlook on life. Kwane will continue to display a hopeful and positive outlook on life.      Interventions Encouraged to attend Cardiac Rehabilitation for the exercise;Stress management education Encouraged to attend Cardiac Rehabilitation for the exercise;Stress management education      Continue Psychosocial Services  No Follow up required No Follow up required               Psychosocial Discharge (Final Psychosocial Re-Evaluation):  Psychosocial Re-Evaluation - 03/10/22 1310       Psychosocial Re-Evaluation   Current issues with None Identified    Comments Lakendrick is pleased with the progress he has made thus far particular with weight loss although it does fluctuate up and down. Continues to feel supported by his family in his efforts to asopt a heart healthy lifestyle particular with the nutrition aspect. Looking forward to graduating this friday.    Expected Outcomes Cornelious will continue to display a hopeful and positive outlook on life.    Interventions Encouraged to attend Cardiac Rehabilitation for the exercise;Stress management education    Continue Psychosocial Services  No Follow up required             Vocational Rehabilitation: Provide vocational rehab assistance to qualifying candidates.   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 Participant will state  understanding/return demonstration of topics presented.    Education     Row Name 01/12/22 0900     Education   Cardiac Education Topics Pritikin   Select Workshops      Workshops   Educator Dietitian   Select Nutrition   Nutrition Workshop Fueling a Designer, multimedia   Instruction Review Code 1- Verbalizes Understanding   Class Start Time 0815   Class Stop Time 0904   Class Time Calculation (min) 49 min    [place] Name 01/14/22 0900     Education   Cardiac Education Topics [employer]   Educator Dietitian   Weekly Topic Adding Flavor - Sodium-Free   Instruction Review Code 1- Verbalizes Understanding   Class Start Time 0815   Class Stop Time 0857   Class Time Calculation (min) 42 min    [place] Name 01/16/22 0800     Education   Cardiac Education Topics Pritikin   Select Core Videos     Core Videos   Educator Nurse   Select General Education   General Education Heart Disease Risk Reduction   Instruction Review Code 1- Verbalizes Understanding   Class Start Time 0815   Class Stop Time 0855   Class Time Calculation (min) 40 min    [place] Name 01/19/22 0900     Education   Cardiac Education Topics Pritikin   Select Workshops     Workshops   Educator Exercise Physiologist   Select Psychosocial   Psychosocial Workshop Recognizing and Reducing Stress   Instruction Review Code 1- Verbalizes Understanding   Class Start Time 0810   Class Stop Time 0904   Class Time Calculation (min) 54 min    [place] Name 01/21/22 0900     Education   Cardiac Education Topics [employer]   Educator Dietitian   Weekly Topic Fast and Healthy Breakfasts   Instruction Review Code 1- Verbalizes Understanding   Class Start Time 0815   Class Stop Time 0905   Class Time Calculation (min) 50 min    [place] Name 01/23/22 0900     Education   Cardiac Education Topics Pritikin   Select Core Videos     Core Videos   Educator Dietitian   Select Nutrition   Nutrition Overview of the [place]   Instruction Review Code 1- Verbalizes Understanding   Class Start Time 0815   Class  Stop Time 0900   Class Time Calculation (min) 45 min    [place] Name 01/26/22 0900     Education   Cardiac Education Topics Pritikin   Select Workshops     Workshops   Educator Exercise Physiologist   Select Exercise   Exercise Workshop Hotel manager and Fall Prevention   Instruction Review Code 1- Verbalizes Understanding   Class Start Time [phone]   Class Stop Time 0900   Class Time Calculation (min) 50 min    [place] Name 01/28/22 1000     Education   Cardiac Education Topics [employer]   Educator Dietitian   Weekly Topic Personalizing Your Pritikin Plate   Instruction Review Code 1- Verbalizes Understanding   Class Start Time 0815   Class Stop Time 0900   Class Time Calculation (min) 45 min    [place] Name 01/30/22 0900     Education   Cardiac Education Topics  Pritikin     Core Videos   Educator Nurse   Select Psychosocial   Psychosocial Healthy Minds, Bodies, Hearts   Instruction Review Code 1- Verbalizes Understanding   Class Start Time [phone]   Class Stop Time 0853   Class Time Calculation (min) 35 min    [place] Name 02/04/22 0900     Education   Cardiac Education Topics [employer]   Educator Dietitian   Weekly Topic Tasty Appetizers and Snacks   Instruction Review Code 1- Verbalizes Understanding   Class Start Time [phone]   Class Stop Time 0850   Class Time Calculation (min) 40 min    [place] Name 02/06/22 0900     Education   Cardiac Education Topics Pritikin   Select Core Videos     Core Videos   Educator Dietitian   Select Nutrition   Nutrition Other  Label Reading   Instruction Review Code 1- Verbalizes Understanding   Class Start Time [phone]   Class Stop Time 0856   Class Time Calculation (min) 44 min    [place] Name 02/09/22 1000     Education   Cardiac Education Topics Pritikin   Select Core Videos     Core Videos   Educator Dietitian   Select Nutrition    Nutrition Calorie Density   Instruction Review Code 1- Verbalizes Understanding   Class Start Time 0810   Class Stop Time 0856   Class Time Calculation (min) 46 min    [place] Name 02/13/22 0900     Education   Cardiac Education Topics Pritikin   Select Workshops     Workshops   Educator Exercise Physiologist   Select Psychosocial   Psychosocial Workshop Managing Moods and Relationships   Instruction Review Code 1- Verbalizes Understanding   Class Start Time 0810   Class Stop Time 0851   Class Time Calculation (min) 41 min    [place] Name 02/16/22 0900     Education   Cardiac Education Topics [place]   Select Workshops     Workshops   Educator Exercise Physiologist   Select Exercise   Exercise Workshop Exercise Basics: Building Your Action Plan   Instruction Review Code 1- Verbalizes Understanding   Class Start Time [phone]   Class Stop Time 0855   Class Time Calculation (min) 43 min    [place] Name 02/18/22 0900     Education   Cardiac Education Topics [employer]   Educator Dietitian   Weekly Topic Efficiency Cooking - Meals in a Snap   Instruction Review Code 1- Verbalizes Understanding   Class Start Time 0809   Class Stop Time 0844   Class Time Calculation (min) 35 min    [place] Name 02/20/22 0900     Education   Cardiac Education Topics Pritikin   Academic librarian Exercise Education   Exercise Education Move It!   Instruction Review Code 1- Verbalizes Understanding   Class Start Time 0815   Class Stop Time 0853   Class Time Calculation (min) 38 min    [place] Name 02/23/22 0900     Education   Cardiac Education Topics Pritikin   Select Core Videos     Core Videos   Educator Dietitian   Select Nutrition   Nutrition Nutrition Action Plan  Instruction Review Code 1- Verbalizes Understanding   Class Start Time 0815   Class Stop Time 0852   Class Time  Calculation (min) 37 min    [place] Name 02/25/22 0900     Education   Cardiac Education Topics [employer]   Educator Dietitian   Weekly Topic Simple Sides and Sauces   Instruction Review Code 1- Verbalizes Understanding   Class Start Time 0809   Class Stop Time 0840   Class Time Calculation (min) 31 min    [place] Name 02/27/22 0900     Education   Cardiac Education Topics Pritikin   Select Core Videos     Core Videos   Educator Nurse   Select General Education   General Education Hypertension and Heart Disease   Instruction Review Code 1- Verbalizes Understanding   Class Start Time 0815   Class Stop Time 0850   Class Time Calculation (min) 35 min    [place] Name 03/02/22 0900     Education   Cardiac Education Topics Pritikin   Select Core Videos     Core Videos   Educator Dietitian   Select Nutrition   Nutrition Dining Out - Part 1   Instruction Review Code 1- Verbalizes Understanding   Class Start Time 0815   Class Stop Time 0850   Class Time Calculation (min) 35 min    [place] Name 03/04/22 0900     Education   Cardiac Education Topics [employer]   Educator Dietitian   Weekly Topic One-Pot Wonders   Instruction Review Code 1- Verbalizes Understanding   Class Start Time [phone]   Class Stop Time 0845   Class Time Calculation (min) 35 min    [place] Name 03/06/22 0900     Education   Cardiac Education Topics Pritikin   Select Workshops     Workshops   Educator Exercise Physiologist   Select Psychosocial   Psychosocial Workshop New Thoughts, New Behaviors   Instruction Review Code 1- Verbalizes Understanding   Class Start Time 0810   Class Stop Time 0848   Class Time Calculation (min) 38 min    [place] Name 03/09/22 0900     Education   Cardiac Education Topics Pritikin   Select Core Videos     Core Videos   Educator Exercise Physiologist   Select Exercise Education   Exercise  Education Biomechanial Limitations   Instruction Review Code 1- Verbalizes Understanding   Class Start Time 0815   Class Stop Time 0900   Class Time Calculation (min) 45 min            Core Videos: Exercise    Move It!  Clinical staff conducted group or individual video education with verbal and written material and guidebook.  Patient learns the recommended Pritikin exercise program. Exercise with the goal of living a long, healthy life. Some of the health benefits of exercise include controlled diabetes, healthier blood pressure levels, improved cholesterol levels, improved heart and lung capacity, improved sleep, and better body composition. Everyone should speak with their doctor before starting or changing an exercise routine.  Biomechanical Limitations Clinical staff conducted group or individual video education with verbal and written material and guidebook.  Patient learns how biomechanical limitations can impact exercise and how we can mitigate and possibly overcome limitations to have an impactful and balanced exercise routine.  Body Composition Clinical staff conducted  group or individual video education with verbal and written material and guidebook.  Patient learns that body composition (ratio of muscle mass to fat mass) is a key component to assessing overall fitness, rather than body weight alone. Increased fat mass, especially visceral belly fat, can put [place] at increased risk for metabolic syndrome, type 2 diabetes, heart disease, and even death. It is recommended to combine diet and exercise (cardiovascular and resistance training) to improve your body composition. Seek guidance from your physician and exercise physiologist before implementing an exercise routine.  Exercise Action Plan Clinical staff conducted group or individual video education with verbal and written material and guidebook.  Patient learns the recommended strategies to achieve and enjoy long-term  exercise adherence, including variety, self-motivation, self-efficacy, and positive decision making. Benefits of exercise include fitness, good health, weight management, more energy, better sleep, less stress, and overall well-being.  Medical   Heart Disease Risk Reduction Clinical staff conducted group or individual video education with verbal and written material and guidebook.  Patient learns our heart is our most vital organ as it circulates oxygen, nutrients, white blood cells, and hormones throughout the entire body, and carries waste away. Data supports a plant-based eating plan like the Pritikin Program for its effectiveness in slowing progression of and reversing heart disease. The video provides a number of recommendations to address heart disease.   Metabolic Syndrome and Belly Fat  Clinical staff conducted group or individual video education with verbal and written material and guidebook.  Patient learns what metabolic syndrome is, how it leads to heart disease, and how one can reverse it and keep it from coming back. You have metabolic syndrome if you have 3 of the following 5 criteria: abdominal obesity, high blood pressure, high triglycerides, low HDL cholesterol, and high blood sugar.  Hypertension and Heart Disease Clinical staff conducted group or individual video education with verbal and written material and guidebook.  Patient learns that high blood pressure, or hypertension, is very common in the [place]. Hypertension is largely due to excessive salt intake, but other important risk factors include being overweight, physical inactivity, drinking too much alcohol, smoking, and not eating enough potassium from fruits and vegetables. High blood pressure is a leading risk factor for heart attack, stroke, congestive heart failure, dementia, kidney failure, and premature death. Long-term effects of excessive salt intake include stiffening of the arteries and thickening of heart  muscle and organ damage. Recommendations include ways to reduce hypertension and the risk of heart disease.  Diseases of Our Time - Focusing on Diabetes Clinical staff conducted group or individual video education with verbal and written material and guidebook.  Patient learns why the best way to stop diseases of our time is prevention, through food and other lifestyle changes. Medicine (such as prescription pills and surgeries) is often only a Band-Aid on the problem, not a long-term solution. Most common diseases of our time include obesity, type 2 diabetes, hypertension, heart disease, and cancer. The Pritikin Program is recommended and has been proven to help reduce, reverse, and/or prevent the damaging effects of metabolic syndrome.  Nutrition   Overview of the Pritikin Eating Plan  Clinical staff conducted group or individual video education with verbal and written material and guidebook.  Patient learns about the [place] for disease risk reduction. The [place] emphasizes a wide variety of unrefined, minimally-processed carbohydrates, like fruits, vegetables, whole grains, and legumes. Go, Caution, and Stop food choices are explained. Plant-based and lean animal  proteins are emphasized. Rationale provided for low sodium intake for blood pressure control, low added sugars for blood sugar stabilization, and low added fats and oils for coronary artery disease risk reduction and weight management.  Calorie Density  Clinical staff conducted group or individual video education with verbal and written material and guidebook.  Patient learns about calorie density and how it impacts the Pritikin Eating Plan. Knowing the characteristics of the food you choose will help you decide whether those foods will lead to weight gain or weight loss, and whether you want to consume more or less of them. Weight loss is usually a side effect of the Pritikin Eating Plan because of its focus on  low calorie-dense foods.  Label Reading  Clinical staff conducted group or individual video education with verbal and written material and guidebook.  Patient learns about the Pritikin recommended label reading guidelines and corresponding recommendations regarding calorie density, added sugars, sodium content, and whole grains.  Dining Out - Part 1  Clinical staff conducted group or individual video education with verbal and written material and guidebook.  Patient learns that restaurant meals can be sabotaging because they can be so high in calories, fat, sodium, and/or sugar. Patient learns recommended strategies on how to positively address this and avoid unhealthy pitfalls.  Facts on Fats  Clinical staff conducted group or individual video education with verbal and written material and guidebook.  Patient learns that lifestyle modifications can be just as effective, if not more so, as many medications for lowering your risk of heart disease. A Pritikin lifestyle can help to reduce your risk of inflammation and atherosclerosis (cholesterol build-up, or plaque, in the artery walls). Lifestyle interventions such as dietary choices and physical activity address the cause of atherosclerosis. A review of the types of fats and their impact on blood cholesterol levels, along with dietary recommendations to reduce fat intake is also included.  Nutrition Action Plan  Clinical staff conducted group or individual video education with verbal and written material and guidebook.  Patient learns how to incorporate Pritikin recommendations into their lifestyle. Recommendations include planning and keeping personal health goals in mind as an important part of their success.  Healthy Mind-Set    Healthy Minds, Bodies, Hearts  Clinical staff conducted group or individual video education with verbal and written material and guidebook.  Patient learns how to identify when they are stressed. Video will discuss  the impact of that stress, as well as the many benefits of stress management. Patient will also be introduced to stress management techniques. The way we think, act, and feel has an impact on our hearts.  How Our Thoughts Can Heal Our Hearts  Clinical staff conducted group or individual video education with verbal and written material and guidebook.  Patient learns that negative thoughts can cause depression and anxiety. This can result in negative lifestyle behavior and serious health problems. Cognitive behavioral therapy is an effective method to help control our thoughts in order to change and improve our emotional outlook.  Additional Videos:  Exercise    Improving Performance  Clinical staff conducted group or individual video education with verbal and written material and guidebook.  Patient learns to use a non-linear approach by alternating intensity levels and lengths of time spent exercising to help burn more calories and lose more body fat. Cardiovascular exercise helps improve heart health, metabolism, hormonal balance, blood sugar control, and recovery from fatigue. Resistance training improves strength, endurance, balance, coordination, reaction time, metabolism, and muscle  mass. Flexibility exercise improves circulation, posture, and balance. Seek guidance from your physician and exercise physiologist before implementing an exercise routine and learn your capabilities and proper form for all exercise.  Introduction to Yoga  Clinical staff conducted group or individual video education with verbal and written material and guidebook.  Patient learns about yoga, a discipline of the coming together of mind, breath, and body. The benefits of yoga include improved flexibility, improved range of motion, better posture and core strength, increased lung function, weight loss, and positive self-image. Yoga's heart health benefits include lowered blood pressure, healthier heart rate, decreased  cholesterol and triglyceride levels, improved immune function, and reduced stress. Seek guidance from your physician and exercise physiologist before implementing an exercise routine and learn your capabilities and proper form for all exercise.  Medical   Aging: Enhancing Your Quality of Life  Clinical staff conducted group or individual video education with verbal and written material and guidebook.  Patient learns key strategies and recommendations to stay in good physical health and enhance quality of life, such as prevention strategies, having an advocate, securing a [place], and keeping a list of medications and system for tracking them. It also discusses how to avoid risk for bone loss.  Biology of Weight Control  Clinical staff conducted group or individual video education with verbal and written material and guidebook.  Patient learns that weight gain occurs because we consume more calories than we burn (eating more, moving less). Even if your body weight is normal, you may have higher ratios of fat compared to muscle mass. Too much body fat puts you at increased risk for cardiovascular disease, heart attack, stroke, type 2 diabetes, and obesity-related cancers. In addition to exercise, following the [place] can help reduce your risk.  Decoding Lab Results  Clinical staff conducted group or individual video education with verbal and written material and guidebook.  Patient learns that lab test reflects one measurement whose values change over time and are influenced by many factors, including medication, stress, sleep, exercise, food, hydration, pre-existing medical conditions, and more. It is recommended to use the knowledge from this video to become more involved with your lab results and evaluate your numbers to speak with your doctor.   Diseases of Our Time - Overview  Clinical staff conducted group or individual video education with verbal  and written material and guidebook.  Patient learns that according to the CDC, 50% to 70% of chronic diseases (such as obesity, type 2 diabetes, elevated lipids, hypertension, and heart disease) are avoidable through lifestyle improvements including healthier food choices, listening to satiety cues, and increased physical activity.  Sleep Disorders Clinical staff conducted group or individual video education with verbal and written material and guidebook.  Patient learns how good quality and duration of sleep are important to overall health and well-being. Patient also learns about sleep disorders and how they impact health along with recommendations to address them, including discussing with a physician.  Nutrition  Dining Out - Part 2 Clinical staff conducted group or individual video education with verbal and written material and guidebook.  Patient learns how to plan ahead and communicate in order to maximize their dining experience in a healthy and nutritious manner. Included are recommended food choices based on the type of restaurant the patient is visiting.   Fueling a Best boy conducted group or individual video education with verbal and written material and guidebook.  There is  a strong connection between our food choices and our health. Diseases like obesity and type 2 diabetes are very prevalent and are in large-part due to lifestyle choices. The Pritikin Eating Plan provides plenty of food and hunger-curbing satisfaction. It is easy to follow, affordable, and helps reduce health risks.  Menu Workshop  Clinical staff conducted group or individual video education with verbal and written material and guidebook.  Patient learns that restaurant meals can sabotage health goals because they are often packed with calories, fat, sodium, and sugar. Recommendations include strategies to plan ahead and to communicate with the manager, chef, or server to help order a healthier  meal.  Planning Your Eating Strategy  Clinical staff conducted group or individual video education with verbal and written material and guidebook.  Patient learns about the [place] and its benefit of reducing the risk of disease. The [place] does not focus on calories. Instead, it emphasizes high-quality, nutrient-rich foods. By knowing the characteristics of the foods, we choose, we can determine their calorie density and make informed decisions.  Targeting Your Nutrition Priorities  Clinical staff conducted group or individual video education with verbal and written material and guidebook.  Patient learns that lifestyle habits have a tremendous impact on disease risk and progression. This video provides eating and physical activity recommendations based on your personal health goals, such as reducing LDL cholesterol, losing weight, preventing or controlling type 2 diabetes, and reducing high blood pressure.  Vitamins and Minerals  Clinical staff conducted group or individual video education with verbal and written material and guidebook.  Patient learns different ways to obtain key vitamins and minerals, including through a recommended healthy diet. It is important to discuss all supplements you take with your doctor.   Healthy Mind-Set    Smoking Cessation  Clinical staff conducted group or individual video education with verbal and written material and guidebook.  Patient learns that cigarette smoking and tobacco addiction pose a serious health risk which affects millions of people. Stopping smoking will significantly reduce the risk of heart disease, lung disease, and many forms of cancer. Recommended strategies for quitting are covered, including working with your doctor to develop a successful plan.  Culinary   Becoming a Financial trader conducted group or individual video education with verbal and written material and guidebook.  Patient learns  that cooking at home can be healthy, cost-effective, quick, and puts them in control. Keys to cooking healthy recipes will include looking at your recipe, assessing your equipment needs, planning ahead, making it simple, choosing cost-effective seasonal ingredients, and limiting the use of added fats, salts, and sugars.  Cooking - Breakfast and Snacks  Clinical staff conducted group or individual video education with verbal and written material and guidebook.  Patient learns how important breakfast is to satiety and nutrition through the entire day. Recommendations include key foods to eat during breakfast to help stabilize blood sugar levels and to prevent overeating at meals later in the day. Planning ahead is also a key component.  Cooking - Human resources officer conducted group or individual video education with verbal and written material and guidebook.  Patient learns eating strategies to improve overall health, including an approach to cook more at home. Recommendations include thinking of animal protein as a side on your plate rather than center stage and focusing instead on lower calorie dense options like vegetables, fruits, whole grains, and plant-based proteins, such as beans. Making sauces in large  quantities to freeze for later and leaving the skin on your vegetables are also recommended to maximize your experience.  Cooking - Healthy Salads and Dressing Clinical staff conducted group or individual video education with verbal and written material and guidebook.  Patient learns that vegetables, fruits, whole grains, and legumes are the foundations of the [place]. Recommendations include how to incorporate each of these in flavorful and healthy salads, and how to create homemade salad dressings. Proper handling of ingredients is also covered. Cooking - Soups and [employer] - Soups and Desserts Clinical staff conducted group or individual video education with  verbal and written material and guidebook.  Patient learns that Pritikin soups and desserts make for easy, nutritious, and delicious snacks and meal components that are low in sodium, fat, sugar, and calorie density, while high in vitamins, minerals, and filling fiber. Recommendations include simple and healthy ideas for soups and desserts.   Overview     The Pritikin Solution Program Overview Clinical staff conducted group or individual video education with verbal and written material and guidebook.  Patient learns that the results of the [place] Program have been documented in more than 100 articles published in peer-reviewed journals, and the benefits include reducing risk factors for (and, in some cases, even reversing) high cholesterol, high blood pressure, type 2 diabetes, obesity, and more! An overview of the three key pillars of the Pritikin Program will be covered: eating well, doing regular exercise, and having a healthy mind-set.  WORKSHOPS  Exercise: Exercise Basics: Building Your Action Plan Clinical staff led group instruction and group discussion with PowerPoint presentation and patient guidebook. To enhance the learning environment the use of posters, models and videos may be added. At the conclusion of this workshop, patients will comprehend the difference between physical activity and exercise, as well as the benefits of incorporating both, into their routine. Patients will understand the FITT (Frequency, Intensity, Time, and Type) principle and how to use it to build an exercise action plan. In addition, safety concerns and other considerations for exercise and cardiac rehab will be addressed by the presenter. The purpose of this lesson is to promote a comprehensive and effective weekly exercise routine in order to improve patients' overall level of fitness.   Managing Heart Disease: Your Path to a Healthier Heart Clinical staff led group instruction and group discussion with  PowerPoint presentation and patient guidebook. To enhance the learning environment the use of posters, models and videos may be added.At the conclusion of this workshop, patients will understand the anatomy and physiology of the heart. Additionally, they will understand how Pritikin's three pillars impact the risk factors, the progression, and the management of heart disease.  The purpose of this lesson is to provide a high-level overview of the heart, heart disease, and how the Pritikin lifestyle positively impacts risk factors.  Exercise Biomechanics Clinical staff led group instruction and group discussion with PowerPoint presentation and patient guidebook. To enhance the learning environment the use of posters, models and videos may be added. Patients will learn how the structural parts of their bodies function and how these functions impact their daily activities, movement, and exercise. Patients will learn how to promote a neutral spine, learn how to manage pain, and identify ways to improve their physical movement in order to promote healthy living. The purpose of this lesson is to expose patients to common physical limitations that impact physical activity. Participants will learn practical ways to adapt and manage aches and pains,  and to minimize their effect on regular exercise. Patients will learn how to maintain good posture while sitting, walking, and lifting.  Balance Training and Fall Prevention  Clinical staff led group instruction and group discussion with PowerPoint presentation and patient guidebook. To enhance the learning environment the use of posters, models and videos may be added. At the conclusion of this workshop, patients will understand the importance of their sensorimotor skills (vision, proprioception, and the vestibular system) in maintaining their ability to balance as they age. Patients will apply a variety of balancing exercises that are appropriate for their  current level of function. Patients will understand the common causes for poor balance, possible solutions to these problems, and ways to modify their physical environment in order to minimize their fall risk. The purpose of this lesson is to teach patients about the importance of maintaining balance as they age and ways to minimize their risk of falling.  WORKSHOPS   Nutrition:  Fueling a Scientist, research (physical sciences) led group instruction and group discussion with PowerPoint presentation and patient guidebook. To enhance the learning environment the use of posters, models and videos may be added. Patients will review the foundational principles of the [place] and understand what constitutes a serving size in each of the food groups. Patients will also learn Pritikin-friendly foods that are better choices when away from home and review make-ahead meal and snack options. Calorie density will be reviewed and applied to three nutrition priorities: weight maintenance, weight loss, and weight gain. The purpose of this lesson is to reinforce (in a group setting) the key concepts around what patients are recommended to eat and how to apply these guidelines when away from home by planning and selecting Pritikin-friendly options. Patients will understand how calorie density may be adjusted for different weight management goals.  Mindful Eating  Clinical staff led group instruction and group discussion with PowerPoint presentation and patient guidebook. To enhance the learning environment the use of posters, models and videos may be added. Patients will briefly review the concepts of the [place] and the importance of low-calorie dense foods. The concept of mindful eating will be introduced as well as the importance of paying attention to internal hunger signals. Triggers for non-hunger eating and techniques for dealing with triggers will be explored. The purpose of this lesson is to provide  patients with the opportunity to review the basic principles of the [place], discuss the value of eating mindfully and how to measure internal cues of hunger and fullness using the Hunger Scale. Patients will also discuss reasons for non-hunger eating and learn strategies to use for controlling emotional eating.  Targeting Your Nutrition Priorities Clinical staff led group instruction and group discussion with PowerPoint presentation and patient guidebook. To enhance the learning environment the use of posters, models and videos may be added. Patients will learn how to determine their genetic susceptibility to disease by reviewing their family history. Patients will gain insight into the importance of diet as part of an overall healthy lifestyle in mitigating the impact of genetics and other environmental insults. The purpose of this lesson is to provide patients with the opportunity to assess their personal nutrition priorities by looking at their family history, their own health history and current risk factors. Patients will also be able to discuss ways of prioritizing and modifying the [place] for their highest risk areas  Menu  Clinical staff led group instruction and group discussion with PowerPoint presentation  and patient guidebook. To enhance the learning environment the use of posters, models and videos may be added. Using menus brought in from [employer], or printed from [employer], patients will apply the [place] dining out guidelines that were presented in the [employer] video. Patients will also be able to practice these guidelines in a variety of provided scenarios. The purpose of this lesson is to provide patients with the opportunity to practice hands-on learning of the [place] with actual menus and practice scenarios.  Label Reading Clinical staff led group instruction and group discussion with PowerPoint  presentation and patient guidebook. To enhance the learning environment the use of posters, models and videos may be added. Patients will review and discuss the Pritikin label reading guidelines presented in Pritikin's Label Reading Educational series video. Using fool labels brought in from local grocery stores and markets, patients will apply the label reading guidelines and determine if the packaged food meet the Pritikin guidelines. The purpose of this lesson is to provide patients with the opportunity to review, discuss, and practice hands-on learning of the Pritikin Label Reading guidelines with actual packaged food labels. [place] Workshops are designed to teach patients ways to prepare quick, simple, and affordable recipes at home. The importance of nutrition's role in chronic disease risk reduction is reflected in its emphasis in the overall Pritikin program. By learning how to prepare essential core Pritikin Eating Plan recipes, patients will increase control over what they eat; be able to customize the flavor of foods without the use of added salt, sugar, or fat; and improve the quality of the food they consume. By learning a set of core recipes which are easily assembled, quickly prepared, and affordable, patients are more likely to prepare more healthy foods at home. These workshops focus on convenient breakfasts, simple entres, side dishes, and desserts which can be prepared with minimal effort and are consistent with nutrition recommendations for cardiovascular risk reduction. Cooking [employer] are taught by a Engineer, materials (RD) who has been trained by the [employer]. The chef or RD has a clear understanding of the importance of minimizing - if not completely eliminating - added fat, sugar, and sodium in recipes. Throughout the series of [place] Workshop sessions, patients will learn about healthy ingredients and  efficient methods of cooking to build confidence in their capability to prepare    Cooking School weekly topics:  Adding Flavor- Sodium-Free  Fast and Healthy Breakfasts  Powerhouse Plant-Based Proteins  Satisfying Salads and Dressings  Simple Sides and Sauces  International Cuisine-Spotlight on the [place] Zones  Delicious Desserts  Savory Soups  Efficiency Cooking - Meals in a Snap  Tasty Appetizers and Snacks  Comforting Weekend Breakfasts  One-Pot Wonders   Fast Evening Meals  Easy [place] (Psychosocial): New Thoughts, New Behaviors Clinical staff led group instruction and group discussion with PowerPoint presentation and patient guidebook. To enhance the learning environment the use of posters, models and videos may be added. Patients will learn and practice techniques for developing effective health and lifestyle goals. Patients will be able to effectively apply the goal setting process learned to develop at least one new personal goal.  The purpose of this lesson is to expose patients to a new skill set of behavior modification techniques such as techniques setting SMART goals, overcoming barriers, and achieving new thoughts and new  behaviors.  Managing Moods and Relationships Clinical staff led group instruction and group discussion with PowerPoint presentation and patient guidebook. To enhance the learning environment the use of posters, models and videos may be added. Patients will learn how emotional and chronic stress factors can impact their health and relationships. They will learn healthy ways to manage their moods and utilize positive coping mechanisms. In addition, ICR patients will learn ways to improve communication skills. The purpose of this lesson is to expose patients to ways of understanding how one's mood and health are intimately connected. Developing a healthy outlook can help build positive  relationships and connections with others. Patients will understand the importance of utilizing effective communication skills that include actively listening and being heard. They will learn and understand the importance of the "4 Cs" and especially Connections in fostering of a Healthy Mind-Set.  Healthy Sleep for a Healthy Heart Clinical staff led group instruction and group discussion with PowerPoint presentation and patient guidebook. To enhance the learning environment the use of posters, models and videos may be added. At the conclusion of this workshop, patients will be able to demonstrate knowledge of the importance of sleep to overall health, well-being, and quality of life. They will understand the symptoms of, and treatments for, common sleep disorders. Patients will also be able to identify daytime and nighttime behaviors which impact sleep, and they will be able to apply these tools to help manage sleep-related challenges. The purpose of this lesson is to provide patients with a general overview of sleep and outline the importance of quality sleep. Patients will learn about a few of the most common sleep disorders. Patients will also be introduced to the concept of "sleep hygiene," and discover ways to self-manage certain sleeping problems through simple daily behavior changes. Finally, the workshop will motivate patients by clarifying the links between quality sleep and their goals of heart-healthy living.   Recognizing and Reducing Stress Clinical staff led group instruction and group discussion with PowerPoint presentation and patient guidebook. To enhance the learning environment the use of posters, models and videos may be added. At the conclusion of this workshop, patients will be able to understand the types of stress reactions, differentiate between acute and chronic stress, and recognize the impact that chronic stress has on their health. They will also be able to apply different coping  mechanisms, such as reframing negative self-talk. Patients will have the opportunity to practice a variety of stress management techniques, such as deep abdominal breathing, progressive muscle relaxation, and/or guided imagery.  The purpose of this lesson is to educate patients on the role of stress in their lives and to provide healthy techniques for coping with it.  Learning Barriers/Preferences:  Learning Barriers/Preferences - 01/06/22 1006       Learning Barriers/Preferences   Learning Barriers None    Learning Preferences Verbal Instruction;Written Material             Education Topics:  Knowledge Questionnaire Score:   Core Components/Risk Factors/Patient Goals at Admission:  Personal Goals and Risk Factors at Admission - 01/06/22 0848       Core Components/Risk Factors/Patient Goals on Admission    Weight Management Yes;Obesity;Weight Loss    Intervention Weight Management/Obesity: Establish reasonable short term and long term weight goals.;Obesity: Provide education and appropriate resources to help participant work on and attain dietary goals.    Admit Weight 258 lb 3.2 oz (117.1 kg)    Goal Weight: Short Term 250 lb (113.4 kg)  Goal Weight: Long Term 220 lb (99.8 kg)    Expected Outcomes Short Term: Continue to assess and modify interventions until short term weight is achieved;Long Term: Adherence to nutrition and physical activity/exercise program aimed toward attainment of established weight goal;Weight Loss: Understanding of general recommendations for a balanced deficit meal plan, which promotes 1-2 lb weight loss per week and includes a negative energy balance of [phone] kcal/d    Hypertension Yes    Intervention Provide education on lifestyle modifcations including regular physical activity/exercise, weight management, moderate sodium restriction and increased consumption of fresh fruit, vegetables, and low fat dairy, alcohol moderation, and smoking  cessation.;Monitor prescription use compliance.    Expected Outcomes Short Term: Continued assessment and intervention until BP is < 140/43m HG in hypertensive participants. < 130/834mHG in hypertensive participants with diabetes, heart failure or chronic kidney disease.;Long Term: Maintenance of blood pressure at goal levels.    Lipids Yes    Intervention Provide education and support for participant on nutrition & aerobic/resistive exercise along with prescribed medications to achieve LDL <7064mHDL >56m69m  Expected Outcomes Short Term: Participant states understanding of desired cholesterol values and is compliant with medications prescribed. Participant is following exercise prescription and nutrition guidelines.;Long Term: Cholesterol controlled with medications as prescribed, with individualized exercise RX and with personalized nutrition plan. Value goals: LDL < 70mg41mL > 40 mg.             Core Components/Risk Factors/Patient Goals Review:   Goals and Risk Factor Review     Row Name 01/12/22 1442 01/21/22 1226 02/17/22 0959 03/10/22 1315       Core Components/Risk Factors/Patient Goals Review   Personal Goals Review Weight Management/Obesity;Hypertension;Lipids Weight Management/Obesity;Hypertension;Lipids Weight Management/Obesity;Hypertension;Lipids Weight Management/Obesity;Hypertension;Lipids    Review Patient started intensive cardiac rehab today, tolerated well, vss, c/o 4/10 chronic knee pain. WilliHalecompleted 6 exercise and some educational sessions.  WilliMariarogressing  well with workloads and increased MET. Seen by neuro for leg weakness and drooling.  Weight measurement show a decrease of 3 kg since begining cardiac rehab.  Pt with normal and appopriate BP readings and is compliant with statin therapy. WilliHoracecompleted 12 exercise/education sessions.  WilliReageninue to make progress with weight loss. WilliCorneilus 4 KG since beginning Cardiac Rehab a bit  discouraged as the weight loss has been slow. Pt exercies is somewhat limited as he had an injection to his left knee on 9/6.  Encouraged pt that this rate of weight loss is sustainable.  WilliMccauleyts he struggles with the Pritikin diet restrictions however he has made some changes.  pt is on optimal lipid management with statin therapy along with fish oil supplements, nutriton and exercise.  Vital signs remain well within normal limts. WilliRaymoncompleted 24 exercise/education sessions anticipate graduation on 03/13/22.  WilliJasiriinue to make progress with weight loss. WilliDeshay 5.4 KG since beginning Cardiac Rehab although the rate of weight loss is not what he would like he understands this weight loss is sustainable. WilliLadislausn optimal lipid management with statin therapy along with fish oil supplements, nutriton and exercise.  Vital signs remain well within normal limts.    Expected Outcomes Patient will maintain a healthy lifestyle, as he is provided the knowledge and tools neccessary to complete goal; he will increase exercise stamina during ICR program. Patient will maintain a healthy lifestyle, as he is provided the knowledge and tools neccessary to complete goal; he will increase exercise  stamina during [employer] program. Thermon will adopt a heart healthy lifestyle in accordance to the three pillars learned with Pritikin Intensive Cardiac Rehab: Exercise, heart healthy nutrition and healthy mind set. Daiton will adopt a heart healthy lifestyle in accordance to the three pillars learned with Pritikin Intensive Cardiac Rehab: Exercise, heart healthy nutrition and healthy mind set.             Core Components/Risk Factors/Patient Goals at Discharge (Final Review):   Goals and Risk Factor Review - 03/10/22 1315       Core Components/Risk Factors/Patient Goals Review   Personal Goals Review Weight Management/Obesity;Hypertension;Lipids    Review Georgio has completed 24  exercise/education sessions anticipate graduation on 03/13/22.  Durward continue to make progress with weight loss. Makai lost 5.4 KG since beginning Cardiac Rehab although the rate of weight loss is not what he would like he understands this weight loss is sustainable. Jaesean is on optimal lipid management with statin therapy along with fish oil supplements, nutriton and exercise.  Vital signs remain well within normal limts.    Expected Outcomes Jahsir will adopt a heart healthy lifestyle in accordance to the three pillars learned with Pritikin Intensive Cardiac Rehab: Exercise, heart healthy nutrition and healthy mind set.             ITP Comments:  ITP Comments     Row Name 01/06/22 0834 01/06/22 0944 01/21/22 1021 02/20/22 1322 03/10/22 1320   ITP Comments Medical Director- Dr. Fransico Him, MD Dr. Fransico Him Medical Director: Introduction to Pritikin Education program/ Intensive Cardiac Rehab, initial Pritikin orientation pack reviewed 30 day ITP Review.  Seymour is off to a great start 30 day ITP Review 30 day ITP Review. Anticipate graduation on 03/13/22.            Comments: Pt is making expected progress toward personal goals after completing 24 sessions. Recommend continued exercise and life style modification education including  stress management and relaxation techniques to decrease cardiac risk profile. Cherre Huger, BSN Cardiac and Training and development officer

## 2022-03-11 ENCOUNTER — Encounter (HOSPITAL_COMMUNITY)
Admission: RE | Admit: 2022-03-11 | Discharge: 2022-03-11 | Disposition: A | Discharge status: Home / Self Care | Payer: Medicare Other | Source: Ambulatory Visit | Attending: Cardiology | Admitting: Cardiology

## 2022-03-11 DIAGNOSIS — I2510 Atherosclerotic heart disease of native coronary artery without angina pectoris: Secondary | ICD-10-CM | POA: Diagnosis not present

## 2022-03-11 DIAGNOSIS — Z955 Presence of coronary angioplasty implant and graft: Secondary | ICD-10-CM | POA: Diagnosis not present

## 2022-03-11 DIAGNOSIS — Z48812 Encounter for surgical aftercare following surgery on the circulatory system: Secondary | ICD-10-CM | POA: Diagnosis not present

## 2022-03-12 ENCOUNTER — Telehealth (HOSPITAL_COMMUNITY): Payer: Self-pay | Admitting: *Deleted

## 2022-03-12 NOTE — Telephone Encounter (Signed)
Rodgerick is scheduled to graduate on 10/13. Called and left message for pt to review graduation information with him. Requested call back. Cherre Huger, BSN Cardiac and Training and development officer

## 2022-03-13 ENCOUNTER — Encounter (HOSPITAL_COMMUNITY)
Admission: RE | Admit: 2022-03-13 | Discharge: 2022-03-13 | Disposition: A | Discharge status: Home / Self Care | Payer: Medicare Other | Source: Ambulatory Visit | Attending: Cardiology | Admitting: Cardiology

## 2022-03-13 VITALS — Ht 71.75 in | Wt 246.3 lb

## 2022-03-13 DIAGNOSIS — Z48812 Encounter for surgical aftercare following surgery on the circulatory system: Secondary | ICD-10-CM | POA: Diagnosis not present

## 2022-03-13 DIAGNOSIS — Z955 Presence of coronary angioplasty implant and graft: Secondary | ICD-10-CM | POA: Diagnosis not present

## 2022-03-13 DIAGNOSIS — I2510 Atherosclerotic heart disease of native coronary artery without angina pectoris: Secondary | ICD-10-CM | POA: Diagnosis not present

## 2022-03-18 ENCOUNTER — Ambulatory Visit: Payer: Medicare Other | Admitting: Neurology

## 2022-03-18 VITALS — BP 133/66 | HR 37 | Ht 73.0 in | Wt 244.0 lb

## 2022-03-18 DIAGNOSIS — R269 Unspecified abnormalities of gait and mobility: Secondary | ICD-10-CM

## 2022-03-18 DIAGNOSIS — G629 Polyneuropathy, unspecified: Secondary | ICD-10-CM

## 2022-03-18 DIAGNOSIS — G4733 Obstructive sleep apnea (adult) (pediatric): Secondary | ICD-10-CM

## 2022-03-18 DIAGNOSIS — D472 Monoclonal gammopathy: Secondary | ICD-10-CM

## 2022-03-18 DIAGNOSIS — G2581 Restless legs syndrome: Secondary | ICD-10-CM

## 2022-03-18 NOTE — Progress Notes (Signed)
ASSESSMENT AND PLAN  Daniel Whitaker is a 76 y.o. male  Slow progressive worsening distal leg paresthesia, gait abnormality Monoclonal gammopathy of unknown clinical significance, IgA, low titer 0.4     EMG nerve conduction study evidence of moderate axonal sensorimotor polyneuropathy, moderately severe right carpal tunnel syndromes, no significant evidence of demyelinating features  Laboratory evaluation for possible etiology  Restless leg syndrome  Change Lyrica to 100 mg tablets, heavy dose at nighttime, only take as needed during the day  Limit Requip  up to 2 mg every night only take it before bedtime to avoid augmentation  Above medication change has improved his restless leg symptoms, can sleep better,  At risk for obstructive sleep apnea  Loud snoring, obesity, excessive daytime sleepiness and fatigue  Was refer to sleep study  Excessive drooling   Improved with better control of this acid reflux,  DIAGNOSTIC DATA (LABS, IMAGING, TESTING) - I reviewed patient records, labs, notes, testing and imaging myself where available.   MEDICAL HISTORY:  Daniel Whitaker is a 76 year old male, follow-up for restless leg syndrome, peripheral neuropathy, excessive drooling, his primary care physician is Dr. [name], he was seen by Dr. Jannifer Franklin and Jinny Blossom in the past  I reviewed and summarized the referring note. PMHX. HTN CAD, stent in May, on Asa+Plavix HLD MGUS Small bowel obstruction, s/p Colon Resection   Patient was seen by our clinic since 2014 for bilateral lower extremity paresthesia, numbness was at bottom of his feet and toes, over the years, only slight progression of the numbness, he denies bilateral fingertips paresthesia  But over the past few years he noticed gradual onset unsteady gait, he still plays golf about once a week, but use golf cart, instead of walking, he complains of difficulty shampooing his hair, with his eyes closed, he felt more  unsteady  Since 2023, he also noticed intermittent sharp radiating discomfort from bilateral knee down, he denies bowel or bladder incontinence, denies significant low back pain  He apparently has relative sedentary lifestyle, obesity,  frequent awakening at nighttime, excessive daytime sleepiness, fatigue, never had sleep study before, chronic artery disease, status post stent placement in May 2023, on aspirin and Plavix  He has irregular sleep pattern, tends to nod off during the day watching TV, at the evening time, he complains of bilateral lower extremity discomfort, was on Requip 2 mg daily, now with higher dose 1 tablet at 7 PM, half tablet before he goes to bed, which seems to help him some, in addition he is taking Lyrica titrating dose up to 200 mg twice a day now, he was not sure he is symptomatic during the day, but he still take it twice a day, he can falling to sleep fairly quickly with the help of medication  Laboratory evaluation over the years has showed M protein, titer was 0.4 in July 2022, IgA monoclonal protein with kappa light chain specificity  Over the past few months he also noticed excessive drooling, especially at nighttime, he has to wipe his mouth, frequently, wonder if Botox is a good solution, but he has not tried other conservative methods  UPDATE Mar 18 2022: He came for electrodiagnostic study today, which showed evidence of length-dependent axonal sensorimotor polyneuropathy and moderately severe right carpal tunnel syndrome, there was no significant demyelinating features, he has, neuropathy of unknown clinical significance, last check was in 2022, 0.4 g per DL  His nighttime excessive drooling has much improved with  better control of his acid reflux  He does have excessive daytime fatigue, sleepiness,  PHYSICAL EXAM:   There were no vitals filed for this visit.  Not recorded     There is no height or weight on file to calculate BMI.  PHYSICAL  EXAMNIATION:  Gen: NAD, conversant, well nourised, well groomed                     Cardiovascular: Regular rate rhythm, no peripheral edema, warm, nontender. Eyes: Conjunctivae clear without exudates or hemorrhage Neck: Supple, no carotid bruits. Pulmonary: Clear to auscultation bilaterally   NEUROLOGICAL EXAM:  MENTAL STATUS: Speech/cognition: Tired looking elderly male, decreased facial expression, oriented to history taking care of conversation CRANIAL NERVES: CN II: Visual fields are full to confrontation. Pupils are round equal and briskly reactive to light. CN III, IV, VI: extraocular movement are normal. No ptosis. CN V: Facial sensation is intact to light touch CN VII: Face is symmetric with normal eye closure  CN VIII: Hearing is normal to causal conversation. CN IX, X: Phonation is normal. CN XI: Head turning and shoulder shrug are intact CN XII: Narrow oropharyngeal space  MOTOR: Bilateral lower extremity pitting edema, mild toe extension, flexion weakness, proximal muscle strength was normal  REFLEXES: areflexia  SENSORY: Length Dependent decreased light touch, pin prick, vibratory sensation to below knee level, preserved toe proprioception  COORDINATION: There is no trunk or limb dysmetria noted.  GAIT/STANCE: Need push-up to get up from seated position, mildly unsteady, difficulty standing up on tiptoes and heels, positive Romberg signs,  REVIEW OF SYSTEMS:  Full 14 system review of systems performed and notable only for as above All other review of systems were negative.   ALLERGIES: Allergies  Allergen Reactions   Cymbalta [Duloxetine Hcl]     Drowsiness, nausea   Lipitor [Atorvastatin] Other (See Comments)    Pt unsure of reaction    Penicillins Hives    HOME MEDICATIONS: Current Outpatient Medications  Medication Sig Dispense Refill   aspirin EC 81 MG tablet Take 1 tablet (81 mg total) by mouth daily. Swallow whole. 30 tablet 0   brimonidine  (ALPHAGAN) 0.2 % ophthalmic solution Place 1 drop into both eyes in the morning and at bedtime.     carvedilol (COREG) 3.125 MG tablet Take 1 tablet (3.125 mg total) by mouth 2 (two) times daily. 60 tablet 4   cetirizine (ZYRTEC) 10 MG tablet Take 10 mg by mouth every morning.     Cholecalciferol (VITAMIN D) 2000 UNITS CAPS Take 2,000 Units by mouth daily.     clopidogrel (PLAVIX) 75 MG tablet Take 1 tablet (75 mg total) by mouth daily. 90 tablet 1   Cyanocobalamin (VITAMIN B-12) 5000 MCG TBDP Take 10,000 mcg by mouth every morning.     dorzolamide-timolol (COSOPT) 22.3-6.8 MG/ML ophthalmic solution Place 1 drop into the left eye 2 (two) times daily.     ketoconazole (NIZORAL) 2 % cream Apply 1 application  topically daily as needed for irritation.     latanoprost (XALATAN) 0.005 % ophthalmic solution Place 1 drop into both eyes at bedtime.     lidocaine (XYLOCAINE) 5 % ointment Apply 1 application topically 2 (two) times daily as needed. (Patient taking differently: Apply 1 application  topically 2 (two) times daily as needed (neuropathy).) 35.44 g 3   MAGNESIUM PO Take 400 mg by mouth daily.     Multiple Vitamins-Minerals (CENTRUM) tablet Take 1 tablet by mouth daily. 50 +  nitroGLYCERIN (NITROSTAT) 0.4 MG SL tablet Place 1 tablet (0.4 mg total) under the tongue every 5 (five) minutes as needed. 25 tablet 2   Omega-3 Fatty Acids (FISH OIL) 1200 MG CAPS Take 3,600 mg by mouth daily. 3     pantoprazole (PROTONIX) 40 MG tablet Take 40 mg by mouth daily.      pregabalin (LYRICA) 100 MG capsule One in the am, one at noon and 2 tabs at night 120 capsule 5   rOPINIRole (REQUIP) 2 MG tablet Take 1 tablet at 5 Pm and 1/2 tablet at bedtime (Patient not taking: Reported on 03/12/2022) 135 tablet 3   rosuvastatin (CRESTOR) 40 MG tablet Take 1 tablet (40 mg total) by mouth daily. 90 tablet 1   sildenafil (REVATIO) 20 MG tablet Take 20-100 mg by mouth daily as needed (ED).     No current  facility-administered medications for this visit.    PAST MEDICAL HISTORY: Past Medical History:  Diagnosis Date   Arthritis    Cataract    removed bilaterally    Coronary artery disease    Erectile dysfunction    Esophageal reflux    Glaucoma    Hypertriglyceridemia    Impaired fasting glucose    MGUS (monoclonal gammopathy of unknown significance)    Neuropathy associated with MGUS ([place]) 07/11/2013   Nocturnal leg cramps 03/11/2021   Nontoxic uninodular goiter    Obesity    Peripheral neuropathy    RLS (restless legs syndrome)    Spinal stenosis    Unspecified deficiency anemia 07/11/2013    PAST SURGICAL HISTORY: Past Surgical History:  Procedure Laterality Date   CARDIAC CATHETERIZATION     COLON RESECTION N/A 04/21/2013   Procedure: DIAGNOSTIC LAPAROSCOPY, lysis of adhesions for partial small bowel obstruction;  Surgeon: Odis Hollingshead, MD;  Location: [facility];  Service: General;  Laterality: N/A;   COLON SURGERY     partial SBO    COLONOSCOPY     CORONARY STENT INTERVENTION N/A 10/22/2021   Procedure: CORONARY STENT INTERVENTION;  Surgeon: Leonie Man, MD;  Location: [facility];  Service: Cardiovascular;  Laterality: N/A;   KNEE SURGERY     Arthroscopic   LEFT HEART CATH AND CORONARY ANGIOGRAPHY N/A 10/22/2021   Procedure: LEFT HEART CATH AND CORONARY ANGIOGRAPHY;  Surgeon: Leonie Man, MD;  Location: [facility];  Service: Cardiovascular;  Laterality: N/A;   SMALL INTESTINE SURGERY     Blockage    FAMILY HISTORY: Family History  Problem Relation Age of Onset   Brain cancer Mother    Lung cancer Father    Heart disease Brother        No details   Dementia Brother    Neuropathy Neg Hx    Colon cancer Neg Hx    Colon polyps Neg Hx    Esophageal cancer Neg Hx    Rectal cancer Neg Hx    Stomach cancer Neg Hx     SOCIAL HISTORY: Social History   Socioeconomic History   Marital status: Married    Spouse name: Daniel Basta   Number  of children: 2   Years of education: GED   Highest education level: Not on file  Occupational History   Occupation: still working  Tobacco Use   Smoking status: Former    Types: Cigarettes    Quit date: 06/01/1984    Years since quitting: 37.8   Smokeless tobacco: Never  Vaping Use   Vaping Use: Never used  Substance and Sexual Activity   Alcohol use: Yes    Alcohol/week: 17.0 standard drinks of alcohol    Types: 7 Glasses of wine, 10 Cans of beer per week   Drug use: No   Sexual activity: Not on file  Other Topics Concern   Not on file  Social History Narrative   Patient lives at home with his wife Daniel Basta)   Retired.    Patient has two children.   Patient is right-handed.   Patient has his GED.   Patient does not drink caffeine.   Social Determinants of Health   Financial Resource Strain: Not on file  Food Insecurity: Not on file  Transportation Needs: Not on file  Physical Activity: Not on file  Stress: Not on file  Social Connections: Not on file  Intimate Partner Violence: Not on file      Marcial Pacas, M.D. Ph.D.  [facility] [address], [place], [place] [address] Ph: [phone] Fax: [phone]  CC:  [name], MD [address],  [place] [address]  [name], MD

## 2022-03-18 NOTE — Progress Notes (Addendum)
Discharge Progress Report  Patient Details  Name: SUVAN STCYR MRN: [id number] Date of Birth: 1946/05/11 Referring Provider:   Flowsheet Row INTENSIVE CARDIAC REHAB ORIENT from 01/06/2022 in [place]  Referring Provider Minus Breeding, MD        Number of Visits: 26 exercise and some education classes  Reason for Discharge:  Patient reached a stable level of exercise. Patient independent in their exercise. Patient has met program and personal goals.  Smoking History:  Social History   Tobacco Use  Smoking Status Former   Types: Cigarettes   Quit date: 06/01/1984   Years since quitting: 37.8  Smokeless Tobacco Never    Diagnosis:  10/22/21 DES LAD  ADL UCSD:   Initial Exercise Prescription:  Initial Exercise Prescription - 01/06/22 0900       Date of Initial Exercise RX and Referring Provider   Date 01/06/22    Referring Provider Minus Breeding, MD    Expected Discharge Date 03/13/22      Recumbant Bike   Level 1.5    Minutes 15    METs 2      NuStep   Level 2    SPM 85    Minutes 15    METs 2      Prescription Details   Frequency (times per week) 3    Duration Progress to 30 minutes of continuous aerobic without signs/symptoms of physical distress      Intensity   THRR 40-80% of Max Heartrate 58-116    Ratings of Perceived Exertion 11-13    Perceived Dyspnea 0-4      Progression   Progression Continue to progress workloads to maintain intensity without signs/symptoms of physical distress.      Resistance Training   Training Prescription Yes    Weight 3 lbs    Reps 10-15             Discharge Exercise Prescription (Final Exercise Prescription Changes):  Exercise Prescription Changes - 03/13/22 0830       Response to Exercise   Blood Pressure (Admit) 120/64    Blood Pressure (Exercise) 170/64    Blood Pressure (Exit) 112/70    Heart Rate (Admit) 54 bpm    Heart Rate (Exercise) 121 bpm    Heart  Rate (Exit) 63 bpm    Rating of Perceived Exertion (Exercise) 13    Perceived Dyspnea (Exercise) 0    Symptoms None    Comments Pt graduated the CRP2 program    Duration Progress to 30 minutes of  aerobic without signs/symptoms of physical distress    Intensity THRR unchanged      Progression   Progression Continue to progress workloads to maintain intensity without signs/symptoms of physical distress.    Average METs 5.7      Resistance Training   Training Prescription Yes    Weight 6 lbs wts    Reps 10-15    Time 10 Minutes      NuStep   Level 5    Minutes 20    METs 5.7      Track   Laps 10   Pt post 6MWT   Minutes 6    METs 3.34      Home Exercise Plan   Plans to continue exercise at Home (comment)    Frequency Add 1 additional day to program exercise sessions.    Initial Home Exercises Provided 01/28/22  Functional Capacity:  6 Minute Walk     Row Name 01/06/22 0914 03/13/22 0830       6 Minute Walk   Phase Initial Discharge    Distance 1766 feet 2065 feet    Distance % Change -- 16.93 %    Distance Feet Change -- 299 ft    Walk Time 6 minutes 6 minutes    # of Rest Breaks 0 0    MPH 3.34 3.91    METS 3.4 3.34    RPE 12 13    Perceived Dyspnea  1 0    VO2 Peak 11.91 11.68    Symptoms Yes (comment) Yes (comment)    Comments Patient c/o lower, left leg pain, 4/10 on the pain scale; mild SOB, RPD=1. Bilateral calf burn, resolved with rest    Resting HR 71 bpm 54 bpm    Resting BP 133/70 120/64    Resting Oxygen Saturation  98 % 96 %    Exercise Oxygen Saturation  during 6 min walk 95 % 96 %    Max Ex. HR 104 bpm 106 bpm    Max Ex. BP 178/60 170/64    2 Minute Post BP 166/80 148/62             Psychological, QOL, Others - Outcomes: PHQ 2/9:    03/12/2022    3:12 PM 01/06/2022    9:49 AM 01/22/2014   10:14 AM  Depression screen PHQ 2/9  Decreased Interest 0 0 0  Down, Depressed, Hopeless 0 0 0  PHQ - 2 Score 0 0 0  Altered  sleeping 0    Tired, decreased energy 0    Change in appetite 0    Feeling bad or failure about yourself  0    Trouble concentrating 0    Moving slowly or fidgety/restless 0    Suicidal thoughts 0    PHQ-9 Score 0    Difficult doing work/chores Not difficult at all      Quality of Life:  Quality of Life - 03/13/22 1430       Quality of Life Scores   Health/Function Post 26.8 %    Socioeconomic Post 28.31 %    Psych/Spiritual Post 28.07 %    Family Post 27.6 %    GLOBAL Post 27.51 %             Personal Goals: Goals established at orientation with interventions provided to work toward goal.  Personal Goals and Risk Factors at Admission - 01/06/22 0848       Core Components/Risk Factors/Patient Goals on Admission    Weight Management Yes;Obesity;Weight Loss    Intervention Weight Management/Obesity: Establish reasonable short term and long term weight goals.;Obesity: Provide education and appropriate resources to help participant work on and attain dietary goals.    Admit Weight 258 lb 3.2 oz (117.1 kg)    Goal Weight: Short Term 250 lb (113.4 kg)    Goal Weight: Long Term 220 lb (99.8 kg)    Expected Outcomes Short Term: Continue to assess and modify interventions until short term weight is achieved;Long Term: Adherence to nutrition and physical activity/exercise program aimed toward attainment of established weight goal;Weight Loss: Understanding of general recommendations for a balanced deficit meal plan, which promotes 1-2 lb weight loss per week and includes a negative energy balance of [phone] kcal/d    Hypertension Yes    Intervention Provide education on lifestyle modifcations including regular physical activity/exercise, weight management, moderate sodium restriction  and increased consumption of fresh fruit, vegetables, and low fat dairy, alcohol moderation, and smoking cessation.;Monitor prescription use compliance.    Expected Outcomes Short Term: Continued  assessment and intervention until BP is < 140/62m HG in hypertensive participants. < 130/852mHG in hypertensive participants with diabetes, heart failure or chronic kidney disease.;Long Term: Maintenance of blood pressure at goal levels.    Lipids Yes    Intervention Provide education and support for participant on nutrition & aerobic/resistive exercise along with prescribed medications to achieve LDL <7066mHDL >35m69m  Expected Outcomes Short Term: Participant states understanding of desired cholesterol values and is compliant with medications prescribed. Participant is following exercise prescription and nutrition guidelines.;Long Term: Cholesterol controlled with medications as prescribed, with individualized exercise RX and with personalized nutrition plan. Value goals: LDL < 70mg68mL > 40 mg.              Personal Goals Discharge:  Goals and Risk Factor Review     Row Name 01/12/22 1442 01/21/22 1226 02/17/22 0959 03/10/22 1315 03/12/22 1517     Core Components/Risk Factors/Patient Goals Review   Personal Goals Review Weight Management/Obesity;Hypertension;Lipids Weight Management/Obesity;Hypertension;Lipids Weight Management/Obesity;Hypertension;Lipids Weight Management/Obesity;Hypertension;Lipids Weight Management/Obesity;Hypertension;Lipids   Review Patient started intensive cardiac rehab today, tolerated well, vss, c/o 4/10 chronic knee pain. WilliMurtazacompleted 6 exercise and some educational sessions.  WilliKaleabrogressing  well with workloads and increased MET. Seen by neuro for leg weakness and drooling.  Weight measurement show a decrease of 3 kg since begining cardiac rehab.  Pt with normal and appopriate BP readings and is compliant with statin therapy. WilliQuientincompleted 12 exercise/education sessions.  WilliBanyaninue to make progress with weight loss. WilliAmilio 4 KG since beginning Cardiac Rehab a bit discouraged as the weight loss has been slow. Pt exercies is  somewhat limited as he had an injection to his left knee on 9/6.  Encouraged pt that this rate of weight loss is sustainable.  WilliSakethts he struggles with the Pritikin diet restrictions however he has made some changes.  pt is on optimal lipid management with statin therapy along with fish oil supplements, nutriton and exercise.  Vital signs remain well within normal limts. WilliRamzeycompleted 24 exercise/education sessions anticipate graduation on 03/13/22.  WilliArasinue to make progress with weight loss. WilliEddie 5.4 KG since beginning Cardiac Rehab although the rate of weight loss is not what he would like he understands this weight loss is sustainable. WilliAlijahn optimal lipid management with statin therapy along with fish oil supplements, nutriton and exercise.  Vital signs remain well within normal limts. WilliGwyndolyn Saxonuates on 03/12/22 with excellent attendance to both exercise and education classes in Intensive Cardiac Rehab.  WilliWinferdld be commended for his progress with weight loss of 5.4 kg along with more energy.  WilliJalonis in a 3 story home and reports he no longer has any shortness of breath when he is going up and down.  Arseniy plans to continue his home exercise with the Treadmill and static weight machine that he has in his home.  Plans to exercise 5 days a week for 45 minutes inreasing his speed and incline as tolerated. Plans to make an appt with himself so that he will hold himself accountable for exercise.   Expected Outcomes Patient will maintain a healthy lifestyle, as he is provided the knowledge and tools neccessary to complete goal; he will increase exercise stamina during ICR program. Patient will  maintain a healthy lifestyle, as he is provided the knowledge and tools neccessary to complete goal; he will increase exercise stamina during ICR program. Jeury will adopt a heart healthy lifestyle in accordance to the three pillars learned with [facility] Intensive  Cardiac Rehab: Exercise, heart healthy nutrition and healthy mind set. Tylerjames will adopt a heart healthy lifestyle in accordance to the three pillars learned with [facility] Intensive Cardiac Rehab: Exercise, heart healthy nutrition and healthy mind set. ayce has adopted a more heart healthy lifestyle in accordance to the three pillars learned with [facility] Intensive Cardiac rehab. Exercise - will be consistent for at least 5 days a week, heart healthy- made significant changes with eliminating fried food and incorporating more fruits and vegetables and heatlh mind set - denies any psychosocial barriers and feels well supported.            Exercise Goals and Review:  Exercise Goals     Row Name 01/06/22 0846             Exercise Goals   Increase Physical Activity Yes       Intervention Provide advice, education, support and counseling about physical activity/exercise needs.;Develop an individualized exercise prescription for aerobic and resistive training based on initial evaluation findings, risk stratification, comorbidities and participant's personal goals.       Expected Outcomes Short Term: Attend rehab on a regular basis to increase amount of physical activity.;Long Term: Exercising regularly at least 3-5 days a week.;Long Term: Add in home exercise to make exercise part of routine and to increase amount of physical activity.       Increase Strength and Stamina Yes       Intervention Provide advice, education, support and counseling about physical activity/exercise needs.;Develop an individualized exercise prescription for aerobic and resistive training based on initial evaluation findings, risk stratification, comorbidities and participant's personal goals.       Expected Outcomes Short Term: Increase workloads from initial exercise prescription for resistance, speed, and METs.;Short Term: Perform resistance training exercises routinely during rehab and add in resistance training at  home;Long Term: Improve cardiorespiratory fitness, muscular endurance and strength as measured by increased METs and functional capacity (6MWT)       Able to understand and use rate of perceived exertion (RPE) scale Yes       Intervention Provide education and explanation on how to use RPE scale       Expected Outcomes Short Term: Able to use RPE daily in rehab to express subjective intensity level;Long Term:  Able to use RPE to guide intensity level when exercising independently       Knowledge and understanding of Target Heart Rate Range (THRR) Yes       Intervention Provide education and explanation of THRR including how the numbers were predicted and where they are located for reference       Expected Outcomes Short Term: Able to state/look up THRR;Long Term: Able to use THRR to govern intensity when exercising independently;Short Term: Able to use daily as guideline for intensity in rehab       Able to check pulse independently Yes       Intervention Provide education and demonstration on how to check pulse in carotid and radial arteries.;Review the importance of being able to check your own pulse for safety during independent exercise       Expected Outcomes Short Term: Able to explain why pulse checking is important during independent exercise;Long Term: Able to check pulse independently and  accurately       Understanding of Exercise Prescription Yes       Intervention Provide education, explanation, and written materials on patient's individual exercise prescription       Expected Outcomes Short Term: Able to explain program exercise prescription;Long Term: Able to explain home exercise prescription to exercise independently                Exercise Goals Re-Evaluation:  Exercise Goals Re-Evaluation     Row Name 01/12/22 0850 01/28/22 0826 02/04/22 0938 02/20/22 0824 03/13/22 0830     Exercise Goal Re-Evaluation   Exercise Goals Review Increase Physical Activity;Increase Strength and  Stamina;Able to understand and use rate of perceived exertion (RPE) scale;Knowledge and understanding of Target Heart Rate Range (THRR);Understanding of Exercise Prescription Increase Physical Activity;Increase Strength and Stamina;Able to understand and use rate of perceived exertion (RPE) scale;Knowledge and understanding of Target Heart Rate Range (THRR);Understanding of Exercise Prescription Increase Physical Activity;Increase Strength and Stamina;Able to understand and use rate of perceived exertion (RPE) scale;Knowledge and understanding of Target Heart Rate Range (THRR);Understanding of Exercise Prescription Increase Physical Activity;Increase Strength and Stamina;Able to understand and use rate of perceived exertion (RPE) scale;Knowledge and understanding of Target Heart Rate Range (THRR);Understanding of Exercise Prescription Increase Physical Activity;Increase Strength and Stamina;Able to understand and use rate of perceived exertion (RPE) scale;Knowledge and understanding of Target Heart Rate Range (THRR);Understanding of Exercise Prescription   Comments Pt first day in theCRP2 program. Pt tolerated exercise well with 4/10 chronic left knee pain and an average MET level of 1.7. Pt is learning his THRR, RPE and ExRx. Reviewed MET's, goals and home ExRx. Pt tolerated exercise well with an average MET level of 2.05. Pt feels good about his wt loss goal and will continue to work on it. Pt will continue to exercise on his own by walking, treadmill and swimming 1-2 days a week. Pt is also very active in his normal lifestyle and has been working in the yard often Reviewed MET's. Pt tolerated exercise well with an average MET level of 2.45. Overall pt is feeling good and plans to add resistance to the recubent bike on friday. Reviewed MET's, and goals. Pt tolerated exercise well with an average MET level of 3.03. Pt is doing well with his primary goal of wt loss and is continuing to lose wt. He also states that  he is feeling better and has less shortness of breath and can tell a difference in his endurance Pt graduated the CRP2 program. Pt tolerated exercise well with an average MET level of 5.7. Pt feels like he is doing well, has lost wt, gained strength and has better control of his SOB. Pt will continue to exercise on his own by walking, using his TM, home total gym and swimming during the warm season 5 days a week for 30-45 mins per session. Pt is very happy with his success in the program   Expected Outcomes Will continue to monitor pt and progress workloads as tolerated without sign or symptom Pt will continue to exercise on his own. Will continue to monitor pt and progress workloads as tolerated without sign or symptom Will continue to monitor pt and progress workloads as tolerated without sign or symptom Will continue to monitor pt and progress workloads as tolerated without sign or symptom Pt will continue to exercise on his own and gain strength.            Nutrition & Weight - Outcomes:  Pre Biometrics -  03/13/22 0830       Pre Biometrics   Height 5' 11.75" (1.822 m)    Weight 246 lb 4.1 oz (111.7 kg)    Waist Circumference 46 inches    Hip Circumference 43 inches    Waist to Hip Ratio 1.07 %    BMI (Calculated) 33.65    Triceps Skinfold 16 mm    % Body Fat 32.6 %    Grip Strength 40 kg    Single Leg Stand 7.8 seconds              Nutrition:  Nutrition Therapy & Goals - 03/11/22 1112       Nutrition Therapy   Diet Heart Healthy Diet    Drug/Food Interactions Statins/Certain Fruits      Personal Nutrition Goals   Nutrition Goal Patient to choose a daily variety of fruits, vegetables, whole grains, lean protein, nonfat/low fat dairy as part of heart healthy lifestyle    Personal Goal #2 Patient to limit sodium intake to <1518m daily    Personal Goal #3 Patient to identify and limit daily intake of saturated fat, trans fat, sodium, and refined carbohydrates    Comments  Goals in action. Patient continues to attend the Pritikin education classes and adopt many Pritikin strategies for heart health. He is down 10.8# since starting with our program.      Intervention Plan   Intervention Prescribe, educate and counsel regarding individualized specific dietary modifications aiming towards targeted core components such as weight, hypertension, lipid management, diabetes, heart failure and other comorbidities.;Nutrition handout(s) given to patient.    Expected Outcomes Short Term Goal: Understand basic principles of dietary content, such as calories, fat, sodium, cholesterol and nutrients.;Long Term Goal: Adherence to prescribed nutrition plan.             Nutrition Discharge:  Nutrition Assessments - 03/13/22 1600       Rate Your Plate Scores   Post Score 74             Education Questionnaire Score:  Knowledge Questionnaire Score - 03/13/22 0830       Knowledge Questionnaire Score   Post Score 23/24             Goals reviewed with patient. Pt graduates from  Intensive cardiac rehab program today with completion of   26 exercise and education sessions. Pt maintained good attendance and progressed nicely during their participation in rehab as evidenced by increased MET level.   Medication list reconciled. Repeat  PHQ score-0  .  Pt has made significant lifestyle changes and should be commended for their success.  WTipachieved their goals during cardiac rehab.   Pt plans to continue exercise at his home gym which consist of treadmill and weight machine 5-6 x week. CCherre Huger BSN Cardiac and PTraining and development officer

## 2022-03-18 NOTE — Procedures (Signed)
Full Name: Daniel Whitaker Gender: Male MRN #: [id number] Date of Birth: [birth year]    Visit Date: 03/18/2022 10:52 Age: 76 Years Examining Physician: Marcial Pacas Referring Physician: Marcial Pacas Height: 6 feet 1 inch History: 76 year old male complains of slow worsening ascending paresthesia, mild unsteady gait  Summary of the test:  Nerve conduction study:  Right superficial peroneal, sural sensory responses were absent.  Right tibial motor responses were absent.  Right ulnar sensory responses were within normal limit.  Right median sensory response showed mild to moderately prolonged peak latency with mildly decreased snap amplitude.  Right ulnar motor responses were normal.  Right median motor responses showed mildly prolonged distal latency with mildly decreased CMAP amplitude  Electromyography:  Selected needle examinations was performed at right lower extremity muscles, right lumbosacral paraspinal muscles.  There was increased insertional activity chronic neuropathic changes at right abductors hallucis,  Rest of the needle examination showed no significant abnormalities.  Conclusion: This is an abnormal study.  There is electrodiagnostic evidence of moderate length-dependent axonal sensorimotor polyneuropathy.  In addition, there is evidence of right median neuropathy across the wrist consistent with moderately severe right carpal tunnel syndrome.    ------------------------------- Marcial Pacas M.D. Ph.D.  [facility] [address], [place], [place] [address] Tel: [phone] Fax: [phone]  Verbal informed consent was obtained from the patient, patient was informed of potential risk of procedure, including bruising, bleeding, hematoma formation, infection, muscle weakness, muscle pain, numbness, among others.        [place]    Nerve / Sites Muscle Latency Ref. Amplitude Ref. Rel Amp Segments Distance Velocity Ref. Area    ms  ms mV mV %  cm m/s m/s mVms  R Median - APB     Wrist APB 4.9 ?4.4 3.7 ?4.0 100 Wrist - APB 7   14.7     Upper arm APB 9.5  3.5  93.4 Upper arm - Wrist 24 52 ?49 14.1  R Ulnar - ADM     Wrist ADM 3.7 ?3.3 10.2 ?6.0 100 Wrist - ADM 10   27.0     B.Elbow ADM 6.1  9.7  94.8 B.Elbow - Wrist 15 64 ?49 27.0     A.Elbow ADM 8.5  9.1  93.6 A.Elbow - B.Elbow 15 63 ?49 27.1  R Tibial - AH     Ankle AH NR ?5.8 NR ?4.0 NR Ankle - AH 9   NR           SNC    Nerve / Sites Rec. Site Peak Lat Ref.  Amp Ref. Segments Distance    ms ms V V  cm  R Sural - Ankle (Calf)     Calf Ankle NR ?4.4 NR ?6 Calf - Ankle 14  R Superficial peroneal - Ankle     Lat leg Ankle NR ?4.4 NR ?6 Lat leg - Ankle 14  R Median - Orthodromic (Dig II, Mid palm)     Dig II Wrist 4.0 ?3.4 7 ?10 Dig II - Wrist 13  R Ulnar - Orthodromic, (Dig V, Mid palm)     Dig V Wrist 2.9 ?3.1 7 ?5 Dig V - Wrist 52             F  Wave    Nerve F Lat Ref.   ms ms  R Ulnar - ADM 29.7 ?32.0       EMG Summary Table    Spontaneous MUAP  Recruitment  Muscle IA Fib PSW Fasc Other Amp Dur. Poly Pattern  R. Tibialis anterior Normal None None None _______ Normal Normal Normal Normal  R. Tibialis posterior Normal None None None _______ Normal Normal Normal Normal  R. Peroneus longus Normal None None None _______ Normal Normal Normal Normal  R. Gastrocnemius (Medial head) Normal None None None _______ Normal Normal Normal Normal  R. Vastus lateralis Normal None None None _______ Normal Normal Normal Normal  R. Lumbar paraspinals (low) Normal None None None _______ Normal Normal Normal Normal  R. Lumbar paraspinals (mid) Normal None None None _______ Normal Normal Normal Normal  R. Abductor hallucis Increased None None None _______ Increased Increased 1+ Reduced

## 2022-03-18 NOTE — Progress Notes (Signed)
Chief Complaint  Patient presents with   Procedure    Emg 4       ASSESSMENT AND PLAN  Daniel Whitaker is a 76 y.o. male  Moderate axonal peripheral neuropathy Monoclonal gammopathy of unknown clinical significance, IgA, low titer 0.4  Confirmed by EMG nerve conduction study, no evidence of demyelinating features  Laboratory evaluation including repeat protein electrophoresis for potential etiology  Restless leg syndrome  Doing better with his current regimen, Lyrica 100 mg/100 mg / 200 mg every night limit Requip  up to 2 mg every night only take it before bedtime to avoid augmentation  At risk for obstructive sleep apnea  Loud snoring, obesity, excessive daytime sleepiness and fatigue  Refer to sleep study  Excessive drooling  Improved with Protonix better control of his GERD, mindful about mealtime few hours hours before bedtime   Return to clinic in 6 months with nurse practitioner  DIAGNOSTIC DATA (LABS, IMAGING, TESTING) - I reviewed patient records, labs, notes, testing and imaging myself where available.   MEDICAL HISTORY:  Daniel Whitaker is a 76 year old male, follow-up for restless leg syndrome, peripheral neuropathy, excessive drooling, his primary care physician is Dr. [name], he was seen by Dr. Jannifer Franklin and Jinny Blossom in the past  I reviewed and summarized the referring note. PMHX. HTN CAD, stent in May, on Asa+Plavix HLD MGUS Small bowel obstruction, s/p Colon Resection   Patient was seen by our clinic since 2014 for bilateral lower extremity paresthesia, numbness was at bottom of his feet and toes, over the years, only slight progression of the numbness, he denies bilateral fingertips paresthesia  But over the past few years he noticed gradual onset unsteady gait, he still plays golf about once a week, but use golf cart, instead of walking, he complains of difficulty shampooing his hair, with his eyes closed, he felt more unsteady  Since 2023,  he also noticed intermittent sharp radiating discomfort from bilateral knee down, he denies bowel or bladder incontinence, denies significant low back pain  He apparently has relative sedentary lifestyle, obesity,  frequent awakening at nighttime, excessive daytime sleepiness, fatigue, never had sleep study before, chronic artery disease, status post stent placement in May 2023, on aspirin and Plavix  He has irregular sleep pattern, tends to nod off during the day watching TV, at the evening time, he complains of bilateral lower extremity discomfort, was on Requip 2 mg daily, now with higher dose 1 tablet at 7 PM, half tablet before he goes to bed, which seems to help him some, in addition he is taking Lyrica titrating dose up to 200 mg twice a day now, he was not sure he is symptomatic during the day, but he still take it twice a day, he can falling to sleep fairly quickly with the help of medication  Laboratory evaluation over the years has showed M protein, titer was 0.4 in July 2022, IgA monoclonal protein with kappa light chain specificity  Over the past few months he also noticed excessive drooling, especially at nighttime, he has to wipe his mouth, frequently, wonder if Botox is a good solution, but he has not tried other conservative methods  Update March 18, 2022 He return for electrodiagnostic study today, which confirmed moderate length-dependent axonal peripheral neuropathy, moderately severe right carpal tunnel syndromes  Since medication change of higher dose of Lyrica 100/100/200 daily, he is sleeping much better, able to decrease Requip to 2 mg every night,  He continued complaints of  excessive daytime sleepiness, fatigue, frequent awakening at nighttime,  He has been taking Protonix for acid reflux, be mindful about his mealtime before bed, this has helped his nighttime drooling, Monoclonal gammopathy titer 0.4 of unknown Significance, pending hematology follow-up  PHYSICAL  EXAM:   Vitals:   03/18/22 1021  BP: 133/66  Pulse: (!) 37  Weight: 244 lb (110.7 kg)  Height: '6\' 1"'$  (1.854 m)   Not recorded     Body mass index is 32.19 kg/m.  PHYSICAL EXAMNIATION:  Gen: NAD, conversant, well nourised, well groomed                     Cardiovascular: Regular rate rhythm, no peripheral edema, warm, nontender. Eyes: Conjunctivae clear without exudates or hemorrhage Neck: Supple, no carotid bruits. Pulmonary: Clear to auscultation bilaterally   NEUROLOGICAL EXAM:  MENTAL STATUS: Speech/cognition: Tired looking elderly male, decreased facial expression, oriented to history taking care of conversation CRANIAL NERVES: CN II: Visual fields are full to confrontation. Pupils are round equal and briskly reactive to light. CN III, IV, VI: extraocular movement are normal. No ptosis. CN V: Facial sensation is intact to light touch CN VII: Face is symmetric with normal eye closure  CN VIII: Hearing is normal to causal conversation. CN IX, X: Phonation is normal. CN XI: Head turning and shoulder shrug are intact CN XII: Narrow oropharyngeal space  MOTOR: Bilateral lower extremity pitting edema, mild toe extension, flexion weakness, proximal muscle strength was normal  REFLEXES: areflexia  SENSORY: Length Dependent decreased light touch, pin prick, vibratory sensation to below knee level, preserved toe proprioception  COORDINATION: There is no trunk or limb dysmetria noted.  GAIT/STANCE: Need push-up to get up from seated position, mildly unsteady, difficulty standing up on tiptoes and heels, positive Romberg signs,  REVIEW OF SYSTEMS:  Full 14 system review of systems performed and notable only for as above All other review of systems were negative.   ALLERGIES: Allergies  Allergen Reactions   Cymbalta [Duloxetine Hcl]     Drowsiness, nausea   Lipitor [Atorvastatin] Other (See Comments)    Pt unsure of reaction    Penicillins Hives    HOME  MEDICATIONS: Current Outpatient Medications  Medication Sig Dispense Refill   aspirin EC 81 MG tablet Take 1 tablet (81 mg total) by mouth daily. Swallow whole. 30 tablet 0   brimonidine (ALPHAGAN) 0.2 % ophthalmic solution Place 1 drop into both eyes in the morning and at bedtime.     carvedilol (COREG) 3.125 MG tablet Take 1 tablet (3.125 mg total) by mouth 2 (two) times daily. 60 tablet 4   cetirizine (ZYRTEC) 10 MG tablet Take 10 mg by mouth every morning.     Cholecalciferol (VITAMIN D) 2000 UNITS CAPS Take 2,000 Units by mouth daily.     clopidogrel (PLAVIX) 75 MG tablet Take 1 tablet (75 mg total) by mouth daily. 90 tablet 1   Cyanocobalamin (VITAMIN B-12) 5000 MCG TBDP Take 10,000 mcg by mouth every morning.     dorzolamide-timolol (COSOPT) 22.3-6.8 MG/ML ophthalmic solution Place 1 drop into the left eye 2 (two) times daily.     ketoconazole (NIZORAL) 2 % cream Apply 1 application  topically daily as needed for irritation.     latanoprost (XALATAN) 0.005 % ophthalmic solution Place 1 drop into both eyes at bedtime.     lidocaine (XYLOCAINE) 5 % ointment Apply 1 application topically 2 (two) times daily as needed. (Patient taking differently: Apply 1 application  topically 2 (two) times daily as needed (neuropathy).) 35.44 g 3   MAGNESIUM PO Take 400 mg by mouth daily.     Multiple Vitamins-Minerals (CENTRUM) tablet Take 1 tablet by mouth daily. 50 +     nitroGLYCERIN (NITROSTAT) 0.4 MG SL tablet Place 1 tablet (0.4 mg total) under the tongue every 5 (five) minutes as needed. 25 tablet 2   Omega-3 Fatty Acids (FISH OIL) 1200 MG CAPS Take 3,600 mg by mouth daily. 3     pantoprazole (PROTONIX) 40 MG tablet Take 40 mg by mouth daily.      pregabalin (LYRICA) 100 MG capsule One in the am, one at noon and 2 tabs at night 120 capsule 5   rOPINIRole (REQUIP) 2 MG tablet Take 1 tablet at 5 Pm and 1/2 tablet at bedtime (Patient not taking: Reported on 03/12/2022) 135 tablet 3   rosuvastatin  (CRESTOR) 40 MG tablet Take 1 tablet (40 mg total) by mouth daily. 90 tablet 1   sildenafil (REVATIO) 20 MG tablet Take 20-100 mg by mouth daily as needed (ED).     No current facility-administered medications for this visit.    PAST MEDICAL HISTORY: Past Medical History:  Diagnosis Date   Arthritis    Cataract    removed bilaterally    Coronary artery disease    Erectile dysfunction    Esophageal reflux    Glaucoma    Hypertriglyceridemia    Impaired fasting glucose    MGUS (monoclonal gammopathy of unknown significance)    Neuropathy associated with MGUS ([place]) 07/11/2013   Nocturnal leg cramps 03/11/2021   Nontoxic uninodular goiter    Obesity    Peripheral neuropathy    RLS (restless legs syndrome)    Spinal stenosis    Unspecified deficiency anemia 07/11/2013    PAST SURGICAL HISTORY: Past Surgical History:  Procedure Laterality Date   CARDIAC CATHETERIZATION     COLON RESECTION N/A 04/21/2013   Procedure: DIAGNOSTIC LAPAROSCOPY, lysis of adhesions for partial small bowel obstruction;  Surgeon: Odis Hollingshead, MD;  Location: [facility];  Service: General;  Laterality: N/A;   COLON SURGERY     partial SBO    COLONOSCOPY     CORONARY STENT INTERVENTION N/A 10/22/2021   Procedure: CORONARY STENT INTERVENTION;  Surgeon: Leonie Man, MD;  Location: [facility];  Service: Cardiovascular;  Laterality: N/A;   KNEE SURGERY     Arthroscopic   LEFT HEART CATH AND CORONARY ANGIOGRAPHY N/A 10/22/2021   Procedure: LEFT HEART CATH AND CORONARY ANGIOGRAPHY;  Surgeon: Leonie Man, MD;  Location: [facility];  Service: Cardiovascular;  Laterality: N/A;   SMALL INTESTINE SURGERY     Blockage    FAMILY HISTORY: Family History  Problem Relation Age of Onset   Brain cancer Mother    Lung cancer Father    Heart disease Brother        No details   Dementia Brother    Neuropathy Neg Hx    Colon cancer Neg Hx    Colon polyps Neg Hx    Esophageal cancer Neg  Hx    Rectal cancer Neg Hx    Stomach cancer Neg Hx     SOCIAL HISTORY: Social History   Socioeconomic History   Marital status: Married    Spouse name: Daniel Whitaker   Number of children: 2   Years of education: GED   Highest education level: Not on file  Occupational History   Occupation: still working  Tobacco Use   Smoking status: Former    Types: Cigarettes    Quit date: 06/01/1984    Years since quitting: 37.8   Smokeless tobacco: Never  Vaping Use   Vaping Use: Never used  Substance and Sexual Activity   Alcohol use: Yes    Alcohol/week: 17.0 standard drinks of alcohol    Types: 7 Glasses of wine, 10 Cans of beer per week   Drug use: No   Sexual activity: Not on file  Other Topics Concern   Not on file  Social History Narrative   Patient lives at home with his wife Daniel Whitaker)   Retired.    Patient has two children.   Patient is right-handed.   Patient has his GED.   Patient does not drink caffeine.   Social Determinants of Health   Financial Resource Strain: Not on file  Food Insecurity: Not on file  Transportation Needs: Not on file  Physical Activity: Not on file  Stress: Not on file  Social Connections: Not on file  Intimate Partner Violence: Not on file      Daniel Whitaker, M.D. Ph.D.  [facility] [address], [place], [place] [address] Ph: [phone] Fax: [phone]  CC:  [name], MD [address],  [place] [address]  [name], MD

## 2022-03-19 ENCOUNTER — Ambulatory Visit: Payer: Medicare Other | Admitting: Neurology

## 2022-03-19 ENCOUNTER — Encounter: Payer: Self-pay | Admitting: Neurology

## 2022-03-19 ENCOUNTER — Other Ambulatory Visit: Payer: Self-pay

## 2022-03-19 VITALS — BP 124/64 | HR 60 | Ht 73.0 in | Wt 251.6 lb

## 2022-03-19 DIAGNOSIS — G2581 Restless legs syndrome: Secondary | ICD-10-CM

## 2022-03-19 DIAGNOSIS — E669 Obesity, unspecified: Secondary | ICD-10-CM

## 2022-03-19 DIAGNOSIS — D472 Monoclonal gammopathy: Secondary | ICD-10-CM

## 2022-03-19 DIAGNOSIS — G4719 Other hypersomnia: Secondary | ICD-10-CM

## 2022-03-19 DIAGNOSIS — Z955 Presence of coronary angioplasty implant and graft: Secondary | ICD-10-CM

## 2022-03-19 DIAGNOSIS — Z9189 Other specified personal risk factors, not elsewhere classified: Secondary | ICD-10-CM | POA: Diagnosis not present

## 2022-03-19 DIAGNOSIS — R0683 Snoring: Secondary | ICD-10-CM

## 2022-03-19 NOTE — Patient Instructions (Signed)

## 2022-03-19 NOTE — Progress Notes (Signed)
Subjective:    Patient ID: Daniel Whitaker is a 76 y.o. male.  HPI    Star Age, MD, PhD [facility] [address], [address] [place], [place] [address]  Dear [name],  I saw your patient, Daniel Whitaker, on your kind request in my sleep clinic today for initial consultation of his sleep disorder, in particular, concern for underlying obstructive sleep apnea.  The patient is unaccompanied today.  As you know, [name] 76 year old male with an underlying medical history of MGUS, neuropathy, restless leg syndrome, spinal stenosis, reflux disease, coronary artery disease with status post coronary stent placement in May 2023, arthritis, anemia, and obesity, who reports snoring and excessive daytime somnolence.  I reviewed your office note from 01/19/2022.  He has been on Lyrica for his neuropathy and on ropinirole for his restless leg symptoms.  He was advised to stop the ropinirole and decrease the Lyrica.  His Epworth sleepiness score is 6 out of 24, fatigue severity score is 12 out of 63.  Bedtime is generally between 10 and 11, rise time around 6:30 AM.  He lives with his wife, they have 2 grown children, no pets in the household, he does have a TV in the bedroom but turns it off before falling asleep.  He has no night to night nocturia or recurrent morning headaches.  He reports no family history of sleep apnea.  He had a tonsillectomy as a child, he recently lost weight in the realm of 13 pounds when he was in cardiac rehab.  He recently finished therapy.  He did his caffeine in the form of soda, about 2 to 3 cans of bottles per day.  He drinks decaf coffee.  He drinks alcohol occasionally, not daily, he quit smoking in 1986.  His Past Medical History Is Significant For: Past Medical History:  Diagnosis Date   Arthritis    Cataract    removed bilaterally    Coronary artery disease    Erectile dysfunction    Esophageal reflux    Glaucoma     Hypertriglyceridemia    Impaired fasting glucose    MGUS (monoclonal gammopathy of unknown significance)    Neuropathy associated with MGUS ([place]) 07/11/2013   Nocturnal leg cramps 03/11/2021   Nontoxic uninodular goiter    Obesity    Peripheral neuropathy    RLS (restless legs syndrome)    Spinal stenosis    Unspecified deficiency anemia 07/11/2013    Her Past Surgical History Is Significant For: Past Surgical History:  Procedure Laterality Date   CARDIAC CATHETERIZATION     COLON RESECTION N/A 04/21/2013   Procedure: DIAGNOSTIC LAPAROSCOPY, lysis of adhesions for partial small bowel obstruction;  Surgeon: Odis Hollingshead, MD;  Location: [facility];  Service: General;  Laterality: N/A;   COLON SURGERY     partial SBO    COLONOSCOPY     CORONARY STENT INTERVENTION N/A 10/22/2021   Procedure: CORONARY STENT INTERVENTION;  Surgeon: Leonie Man, MD;  Location: [facility];  Service: Cardiovascular;  Laterality: N/A;   KNEE SURGERY     Arthroscopic   LEFT HEART CATH AND CORONARY ANGIOGRAPHY N/A 10/22/2021   Procedure: LEFT HEART CATH AND CORONARY ANGIOGRAPHY;  Surgeon: Leonie Man, MD;  Location: [facility];  Service: Cardiovascular;  Laterality: N/A;   SMALL INTESTINE SURGERY     Blockage    His Family History Is Significant For: Family History  Problem Relation Age of Onset  Brain cancer Mother    Lung cancer Father    Heart disease Brother        No details   Dementia Brother    Neuropathy Neg Hx    Colon cancer Neg Hx    Colon polyps Neg Hx    Esophageal cancer Neg Hx    Rectal cancer Neg Hx    Stomach cancer Neg Hx    Sleep apnea Neg Hx     His Social History Is Significant For: Social History   Socioeconomic History   Marital status: Married    Spouse name: [name]   Number of children: 2   Years of education: GED   Highest education level: Not on file  Occupational History   Occupation: still working  Tobacco Use   Smoking status:  Former    Types: Cigarettes    Quit date: 06/01/1984    Years since quitting: 37.8   Smokeless tobacco: Never  Vaping Use   Vaping Use: Never used  Substance and Sexual Activity   Alcohol use: Not Currently    Alcohol/week: 10.0 standard drinks of alcohol    Types: 5 Glasses of wine, 5 Cans of beer per week   Drug use: No   Sexual activity: Not on file  Other Topics Concern   Not on file  Social History Narrative   Patient lives at home with his wife [name])   Retired.    Patient has two children.   Patient is right-handed.   Patient has his GED.   Patient does not drink caffeine.   Social Determinants of Health   Financial Resource Strain: Not on file  Food Insecurity: Not on file  Transportation Needs: Not on file  Physical Activity: Not on file  Stress: Not on file  Social Connections: Not on file    His Allergies Are:  Allergies  Allergen Reactions   Cymbalta [Duloxetine Hcl]     Drowsiness, nausea   Lipitor [Atorvastatin] Other (See Comments)    Pt unsure of reaction    Penicillins Hives  :   His Current Medications Are:  Outpatient Encounter Medications as of 03/19/2022  Medication Sig   aspirin EC 81 MG tablet Take 1 tablet (81 mg total) by mouth daily. Swallow whole.   brimonidine (ALPHAGAN) 0.2 % ophthalmic solution Place 1 drop into both eyes in the morning and at bedtime.   carvedilol (COREG) 3.125 MG tablet Take 1 tablet (3.125 mg total) by mouth 2 (two) times daily.   cetirizine (ZYRTEC) 10 MG tablet Take 10 mg by mouth every morning.   Cholecalciferol (VITAMIN D) 2000 UNITS CAPS Take 2,000 Units by mouth daily.   clopidogrel (PLAVIX) 75 MG tablet Take 1 tablet (75 mg total) by mouth daily.   Cyanocobalamin (VITAMIN B-12) 5000 MCG TBDP Take 10,000 mcg by mouth every morning.   dorzolamide-timolol (COSOPT) 22.3-6.8 MG/ML ophthalmic solution Place 1 drop into the left eye 2 (two) times daily.   ketoconazole (NIZORAL) 2 % cream Apply 1 application   topically daily as needed for irritation.   latanoprost (XALATAN) 0.005 % ophthalmic solution Place 1 drop into both eyes at bedtime.   lidocaine (XYLOCAINE) 5 % ointment Apply 1 application topically 2 (two) times daily as needed. (Patient taking differently: Apply 1 application  topically 2 (two) times daily as needed (neuropathy).)   MAGNESIUM PO Take 400 mg by mouth daily.   Multiple Vitamins-Minerals (CENTRUM) tablet Take 1 tablet by mouth daily. 50 +   nitroGLYCERIN (NITROSTAT)  0.4 MG SL tablet Place 1 tablet (0.4 mg total) under the tongue every 5 (five) minutes as needed.   Omega-3 Fatty Acids (FISH OIL) 1200 MG CAPS Take 3,600 mg by mouth daily. 3   pantoprazole (PROTONIX) 40 MG tablet Take 40 mg by mouth daily.    pregabalin (LYRICA) 100 MG capsule One in the am, one at noon and 2 tabs at night   rosuvastatin (CRESTOR) 40 MG tablet Take 1 tablet (40 mg total) by mouth daily.   sildenafil (REVATIO) 20 MG tablet Take 20-100 mg by mouth daily as needed (ED).   rOPINIRole (REQUIP) 2 MG tablet Take 1 tablet at 5 Pm and 1/2 tablet at bedtime   No facility-administered encounter medications on file as of 03/19/2022.  :   Review of Systems:  Out of a complete 14 point review of systems, all are reviewed and negative with the exception of these symptoms as listed below:  Review of Systems  Neurological:        Pt here for sleep consult Pt snores,some fatigue Pt denies hypertension, headaches, sleep study,CPAP ,machine      ESS:6 FSS:12    Objective:  Neurological Exam  Physical Exam Physical Examination:   Vitals:   03/19/22 1252  BP: 124/64  Pulse: 60    General Examination: The patient is a very pleasant 76 y.o. male in no acute distress. He appears well-developed and well-nourished and well groomed.   HEENT: Normocephalic, atraumatic, pupils are equal, round and reactive to light, extraocular tracking is good without limitation to gaze excursion or nystagmus noted.  Hearing is grossly intact. Face is symmetric with normal facial animation. Speech is clear with no dysarthria noted. There is no hypophonia. There is no lip, neck/head, jaw or voice tremor. Neck is supple with full range of passive and active motion. There are no carotid bruits on auscultation. Oropharynx exam reveals: mild mouth dryness, adequate dental hygiene with bridges in place, moderate airway crowding secondary to small airway entry and thicker soft palate, Mallampati class III, neck circumference of 19-7/8 inches, tonsils absent.   Tongue protrudes centrally and palate elevates symmetrically.   Chest: Clear to auscultation without wheezing, rhonchi or crackles noted.  Heart: S1+S2+0, regular and normal without murmurs, rubs or gallops noted.   Abdomen: Soft, non-tender and non-distended.  Extremities: There is no pitting edema in the distal lower extremities bilaterally.   Skin: Warm and dry without trophic changes noted.   Musculoskeletal: exam reveals no obvious joint deformities.   Neurologically:  Mental status: The patient is awake, alert and oriented in all 4 spheres. His immediate and remote memory, attention, language skills and fund of knowledge are appropriate. There is no evidence of aphasia, agnosia, apraxia or anomia. Speech is clear with normal prosody and enunciation. Thought process is linear. Mood is normal and affect is normal.  Cranial nerves II - XII are as described above under HEENT exam.  Motor exam: Normal bulk, strength and tone is noted. There is no obvious action or resting tremor.  Fine motor skills and coordination: grossly intact.  Cerebellar testing: No dysmetria or intention tremor. There is no truncal or gait ataxia.  Sensory exam: intact to light touch in the upper and lower extremities.  Gait, station and balance: He stands easily. No veering to one side is noted. No leaning to one side is noted. Posture is age-appropriate and stance is narrow based.  Gait shows normal stride length and normal pace. No problems turning are noted.  Assessment and Plan:  In summary, BREYLAN LEFEVERS is a very pleasant 76 y.o.-year old male with an underlying medical history of MGUS, neuropathy, restless leg syndrome, spinal stenosis, reflux disease, coronary artery disease with status post coronary stent placement in May 2023, arthritis, anemia, and obesity, whose history and physical exam are concerning for sleep disordered breathing, supporting a current working diagnosis of unspecified sleep apnea, with the main differential diagnoses of obstructive sleep apnea (OSA) versus upper airway resistance syndrome (UARS) versus central sleep apnea (CSA), or mixed sleep apnea. A laboratory attended sleep study is considered gold standard for evaluation of sleep disordered breathing and is recommended at this time and clinically justified.   I had a long chat with the patient about my findings and the diagnosis of sleep apnea , particularly OSA, its prognosis and treatment options. We talked about medical/conservative treatments, surgical interventions and non-pharmacological approaches for symptom control. I explained, in particular, the risks and ramifications of untreated moderate to severe OSA, especially with respect to developing cardiovascular disease down the road, including congestive heart failure (CHF), difficult to treat hypertension, cardiac arrhythmias (particularly A-fib), neurovascular complications including TIA, stroke and dementia. Even type 2 diabetes has, in part, been linked to untreated OSA. Symptoms of untreated OSA may include (but may not be limited to) daytime sleepiness, nocturia (i.e. frequent nighttime urination), memory problems, mood irritability and suboptimally controlled or worsening mood disorder such as depression and/or anxiety, lack of energy, lack of motivation, physical discomfort, as well as recurrent headaches, especially morning or  nocturnal headaches. We talked about the importance of maintaining a healthy lifestyle and striving for healthy weight.  In addition, we talked about the importance of striving for and maintaining good sleep hygiene. I recommended the following at this time: sleep study.  I outlined the differences between a laboratory attended sleep study which is considered more comprehensive and accurate over the option of a home sleep test (HST); the latter may lead to underestimation of sleep disordered breathing in some instances and does not help with diagnosing upper airway resistance syndrome and is not accurate enough to diagnose primary central sleep apnea typically. I explained the different sleep test procedures to the patient in detail and also outlined possible surgical and non-surgical treatment options of OSA, including the use of a pressure airway pressure (PAP) device (ie CPAP, AutoPAP/APAP or BiPAP in certain circumstances), a custom-made dental device (aka oral appliance, which would require a referral to a specialist dentist or orthodontist typically, and is generally speaking not considered a good choice for patients with full dentures or edentulous state), upper airway surgical options, such as traditional UPPP (which is not considered a first-line treatment) or the Inspire device (hypoglossal nerve stimulator, which would involve a referral for consultation with an ENT surgeon, after careful selection, following inclusion criteria). I explained the PAP treatment option to the patient in detail, as this is generally considered first-line treatment.  The patient indicated that he would be willing to try PAP therapy, if the need arises. I explained the importance of being compliant with PAP treatment, not only for insurance purposes but primarily to improve patient's symptoms symptoms, and for the patient's long term health benefit, including to reduce His cardiovascular risks longer-term.    We will pick  up our discussion about the next steps and treatment options after testing.  We will keep him posted as to the test results by phone call and/or MyChart messaging where possible.  We will plan to  follow-up in sleep clinic accordingly as well.  I answered all his questions today and the patient was in agreement.   I encouraged him to call with any interim questions, concerns, problems or updates or email [place] through [facility].  Generally speaking, sleep test authorizations may take up to 2 weeks, sometimes less, sometimes longer, the patient is encouraged to get in touch with [place] if they do not hear back from the sleep lab staff directly within the next 2 weeks.  Thank you very much for allowing me to participate in the care of this nice patient. If I can be of any further assistance to you please do not hesitate to talk to me.  Sincerely,   Star Age, MD, PhD

## 2022-03-20 ENCOUNTER — Inpatient Hospital Stay: Payer: Medicare Other | Attending: Hematology and Oncology

## 2022-03-20 ENCOUNTER — Other Ambulatory Visit: Payer: Self-pay

## 2022-03-20 DIAGNOSIS — G4733 Obstructive sleep apnea (adult) (pediatric): Secondary | ICD-10-CM

## 2022-03-20 DIAGNOSIS — E559 Vitamin D deficiency, unspecified: Secondary | ICD-10-CM | POA: Insufficient documentation

## 2022-03-20 DIAGNOSIS — D472 Monoclonal gammopathy: Secondary | ICD-10-CM | POA: Insufficient documentation

## 2022-03-20 DIAGNOSIS — G2581 Restless legs syndrome: Secondary | ICD-10-CM

## 2022-03-20 DIAGNOSIS — R269 Unspecified abnormalities of gait and mobility: Secondary | ICD-10-CM

## 2022-03-20 DIAGNOSIS — Z79899 Other long term (current) drug therapy: Secondary | ICD-10-CM | POA: Insufficient documentation

## 2022-03-20 DIAGNOSIS — D649 Anemia, unspecified: Secondary | ICD-10-CM | POA: Insufficient documentation

## 2022-03-20 LAB — CBC WITH DIFFERENTIAL (CANCER CENTER ONLY)
Abs Immature Granulocytes: 0.02 10*3/uL (ref 0.00–0.07)
Basophils Absolute: 0.1 10*3/uL (ref 0.0–0.1)
Basophils Relative: 1 %
Eosinophils Absolute: 0.3 10*3/uL (ref 0.0–0.5)
Eosinophils Relative: 4 %
HCT: 37.2 % — ABNORMAL LOW (ref 39.0–52.0)
Hemoglobin: 12.9 g/dL — ABNORMAL LOW (ref 13.0–17.0)
Immature Granulocytes: 0 %
Lymphocytes Relative: 29 %
Lymphs Abs: 1.9 10*3/uL (ref 0.7–4.0)
MCH: 31.4 pg (ref 26.0–34.0)
MCHC: 34.7 g/dL (ref 30.0–36.0)
MCV: 90.5 fL (ref 80.0–100.0)
Monocytes Absolute: 0.5 10*3/uL (ref 0.1–1.0)
Monocytes Relative: 7 %
Neutro Abs: 3.9 10*3/uL (ref 1.7–7.7)
Neutrophils Relative %: 59 %
Platelet Count: 214 10*3/uL (ref 150–400)
RBC: 4.11 MIL/uL — ABNORMAL LOW (ref 4.22–5.81)
RDW: 13 % (ref 11.5–15.5)
WBC Count: 6.7 10*3/uL (ref 4.0–10.5)
nRBC: 0 % (ref 0.0–0.2)

## 2022-03-20 LAB — COMPREHENSIVE METABOLIC PANEL
ALT: 13 U/L (ref 0–44)
AST: 17 U/L (ref 15–41)
Albumin: 4.3 g/dL (ref 3.5–5.0)
Alkaline Phosphatase: 82 U/L (ref 38–126)
Anion gap: 7 (ref 5–15)
BUN: 13 mg/dL (ref 8–23)
CO2: 27 mmol/L (ref 22–32)
Calcium: 9.6 mg/dL (ref 8.9–10.3)
Chloride: 106 mmol/L (ref 98–111)
Creatinine, Ser: 0.94 mg/dL (ref 0.61–1.24)
GFR, Estimated: >60 mL/min (ref >60)
Glucose, Bld: 106 mg/dL — ABNORMAL HIGH (ref 70–99)
Potassium: 4.1 mmol/L (ref 3.5–5.1)
Sodium: 140 mmol/L (ref 135–145)
Total Bilirubin: 0.5 mg/dL (ref 0.3–1.2)
Total Protein: 7.1 g/dL (ref 6.5–8.1)

## 2022-03-20 LAB — HEMOGLOBIN A1C
Hgb A1c MFr Bld: 5.1 % (ref 4.8–5.6)
Mean Plasma Glucose: 99.67 mg/dL

## 2022-03-20 LAB — SEDIMENTATION RATE: Sed Rate: 21 mm/hr — ABNORMAL HIGH (ref 0–16)

## 2022-03-23 ENCOUNTER — Other Ambulatory Visit: Payer: Self-pay | Admitting: Neurology

## 2022-03-23 ENCOUNTER — Telehealth: Payer: Self-pay | Admitting: Neurology

## 2022-03-23 DIAGNOSIS — R269 Unspecified abnormalities of gait and mobility: Secondary | ICD-10-CM | POA: Diagnosis not present

## 2022-03-23 DIAGNOSIS — D472 Monoclonal gammopathy: Secondary | ICD-10-CM | POA: Diagnosis not present

## 2022-03-23 DIAGNOSIS — G2581 Restless legs syndrome: Secondary | ICD-10-CM | POA: Diagnosis not present

## 2022-03-23 DIAGNOSIS — G4733 Obstructive sleep apnea (adult) (pediatric): Secondary | ICD-10-CM | POA: Diagnosis not present

## 2022-03-23 LAB — KAPPA/LAMBDA LIGHT CHAINS
Kappa free light chain: 76 mg/L — ABNORMAL HIGH (ref 3.3–19.4)
Kappa, lambda light chain ratio: 13.82 — ABNORMAL HIGH (ref 0.26–1.65)
Lambda free light chains: 5.5 mg/L — ABNORMAL LOW (ref 5.7–26.3)

## 2022-03-23 NOTE — Telephone Encounter (Signed)
I returned Daniel Whitaker's call and gave verbal to process labs from 03/18/2022. She sts we will be notified via fax once these labs are completed.

## 2022-03-23 NOTE — Telephone Encounter (Signed)
Lauen @ [facility] is asking for an ok to do lab work pt informed them of.  They would like to know when Dr Krista Blue would like lab work done, call back # [phone]

## 2022-03-24 ENCOUNTER — Telehealth: Payer: Self-pay | Admitting: Neurology

## 2022-03-24 LAB — FERRITIN: Ferritin: 64 ng/mL (ref 30–400)

## 2022-03-24 LAB — SPECIMEN STATUS REPORT

## 2022-03-24 LAB — ANA W/REFLEX IF POSITIVE: Anti Nuclear Antibody (ANA): NEGATIVE

## 2022-03-24 LAB — VITAMIN B12: Vitamin B-12: >2000 pg/mL — ABNORMAL HIGH (ref 232–1245)

## 2022-03-24 LAB — C-REACTIVE PROTEIN: CRP: <1 mg/L (ref 0–10)

## 2022-03-24 LAB — TSH: TSH: 1.94 u[IU]/mL (ref 0.450–4.500)

## 2022-03-24 LAB — RPR: RPR Ser Ql: NONREACTIVE

## 2022-03-24 NOTE — Telephone Encounter (Signed)
03/23/22 [employer] medicare no auth req   left VM to schedule 03/23/22 [place]

## 2022-03-25 LAB — MULTIPLE MYELOMA PANEL, SERUM
Albumin SerPl Elph-Mcnc: 3.9 g/dL (ref 2.9–4.4)
Albumin/Glob SerPl: 1.4 (ref 0.7–1.7)
Alpha 1: 0.3 g/dL (ref 0.0–0.4)
Alpha2 Glob SerPl Elph-Mcnc: 0.6 g/dL (ref 0.4–1.0)
B-Globulin SerPl Elph-Mcnc: 0.8 g/dL (ref 0.7–1.3)
Gamma Glob SerPl Elph-Mcnc: 1.1 g/dL (ref 0.4–1.8)
Globulin, Total: 2.8 g/dL (ref 2.2–3.9)
IgA: 669 mg/dL — ABNORMAL HIGH (ref 61–437)
IgG (Immunoglobin G), Serum: 573 mg/dL — ABNORMAL LOW (ref 603–1613)
IgM (Immunoglobulin M), Srm: 39 mg/dL (ref 15–143)
M Protein SerPl Elph-Mcnc: 0.5 g/dL — ABNORMAL HIGH
Total Protein ELP: 6.7 g/dL (ref 6.0–8.5)

## 2022-03-27 ENCOUNTER — Inpatient Hospital Stay (HOSPITAL_BASED_OUTPATIENT_CLINIC_OR_DEPARTMENT_OTHER): Payer: Medicare Other | Admitting: Hematology and Oncology

## 2022-03-27 ENCOUNTER — Telehealth: Payer: Self-pay

## 2022-03-27 ENCOUNTER — Encounter: Payer: Self-pay | Admitting: Hematology and Oncology

## 2022-03-27 ENCOUNTER — Other Ambulatory Visit: Payer: Self-pay

## 2022-03-27 VITALS — BP 124/55 | HR 55 | Temp 97.7°F | Resp 18 | Ht 73.0 in | Wt 245.0 lb

## 2022-03-27 DIAGNOSIS — D649 Anemia, unspecified: Secondary | ICD-10-CM | POA: Diagnosis not present

## 2022-03-27 DIAGNOSIS — D638 Anemia in other chronic diseases classified elsewhere: Secondary | ICD-10-CM | POA: Diagnosis not present

## 2022-03-27 DIAGNOSIS — E559 Vitamin D deficiency, unspecified: Secondary | ICD-10-CM | POA: Diagnosis not present

## 2022-03-27 DIAGNOSIS — D472 Monoclonal gammopathy: Secondary | ICD-10-CM | POA: Diagnosis not present

## 2022-03-27 DIAGNOSIS — Z79899 Other long term (current) drug therapy: Secondary | ICD-10-CM | POA: Diagnosis not present

## 2022-03-27 NOTE — Progress Notes (Signed)
[facility] OFFICE PROGRESS NOTE  Patient Care Team: [name], MD as PCP - General (Family Medicine) Minus Breeding, MD as PCP - Cardiology (Cardiology)  ASSESSMENT & PLAN:  MGUS (monoclonal gammopathy of unknown significance) Clinically, he has no presence of disease progression. His M protein is only marginally elevated However, the patient complains of slight worsening back pain recently I will order skeletal survey His skeletal survey is negative for any abnormal changes, I will see him in 6 months  Anemia, chronic disease This is likely anemia of chronic disease. The patient denies recent history of bleeding such as epistaxis, hematuria or hematochezia. He is asymptomatic from the anemia. We will observe for now.  He does not require transfusion now. I do not recommend any further work-up at this time.  Likely related to his MGUS Observe only  Orders Placed This Encounter  Procedures   DG Bone Survey Met    Standing Status:   Future    Standing Expiration Date:   03/28/2023    Order Specific Question:   Reason for exam:    Answer:   MGUS/myeloma screening    Order Specific Question:   Preferred imaging location?    Answer:   [facility] ([facility] only)    Standing Status:   Future    Standing Expiration Date:   03/28/2023   CBC with Differential (Cancer Center Only)    Standing Status:   Future    Standing Expiration Date:   03/28/2023   Kappa/lambda light chains    Standing Status:   Future    Standing Expiration Date:   03/28/2023   Multiple Myeloma Panel (SPEP&IFE w/QIG)    Standing Status:   Future    Standing Expiration Date:   03/28/2023   Beta 2 microglobulin, serum    Standing Status:   Future    Standing Expiration Date:   03/28/2023   VITAMIN D 25 Hydroxy (Vit-D Deficiency, Fractures)    Standing Status:   Future    Standing Expiration Date:   03/28/2023    All questions were answered. The patient knows to call  the clinic with any problems, questions or concerns. The total time spent in the appointment was 20 minutes encounter with patients including review of chart and various tests results, discussions about plan of care and coordination of care plan   Heath Lark, MD 03/27/2022 10:56 AM  INTERVAL HISTORY: Please see below for problem oriented charting. he returns for surveillance follow-up for IgA MGUS He noticed some intermittent back pain more so over the last few months Denies recent infection  REVIEW OF SYSTEMS:   Constitutional: Denies fevers, chills or abnormal weight loss Eyes: Denies blurriness of vision Ears, nose, mouth, throat, and face: Denies mucositis or sore throat Respiratory: Denies cough, dyspnea or wheezes Cardiovascular: Denies palpitation, chest discomfort or lower extremity swelling Gastrointestinal:  Denies nausea, heartburn or change in bowel habits Skin: Denies abnormal skin rashes Lymphatics: Denies new lymphadenopathy or easy bruising Neurological:Denies numbness, tingling or new weaknesses Behavioral/Psych: Mood is stable, no new changes  All other systems were reviewed with the patient and are negative.  I have reviewed the past medical history, past surgical history, social history and family history with the patient and they are unchanged from previous note.  ALLERGIES:  is allergic to cymbalta [duloxetine hcl], lipitor [atorvastatin], and penicillins.  MEDICATIONS:  Current Outpatient Medications  Medication Sig Dispense Refill   aspirin EC 81 MG tablet  Take 1 tablet (81 mg total) by mouth daily. Swallow whole. 30 tablet 0   brimonidine (ALPHAGAN) 0.2 % ophthalmic solution Place 1 drop into both eyes in the morning and at bedtime.     carvedilol (COREG) 3.125 MG tablet Take 1 tablet (3.125 mg total) by mouth 2 (two) times daily. 60 tablet 4   cetirizine (ZYRTEC) 10 MG tablet Take 10 mg by mouth every morning.     Cholecalciferol (VITAMIN D) 2000 UNITS  CAPS Take 2,000 Units by mouth daily.     clopidogrel (PLAVIX) 75 MG tablet Take 1 tablet (75 mg total) by mouth daily. 90 tablet 1   Cyanocobalamin (VITAMIN B-12) 5000 MCG TBDP Take 10,000 mcg by mouth every morning.     dorzolamide-timolol (COSOPT) 22.3-6.8 MG/ML ophthalmic solution Place 1 drop into the left eye 2 (two) times daily.     ketoconazole (NIZORAL) 2 % cream Apply 1 application  topically daily as needed for irritation.     latanoprost (XALATAN) 0.005 % ophthalmic solution Place 1 drop into both eyes at bedtime.     lidocaine (XYLOCAINE) 5 % ointment Apply 1 application topically 2 (two) times daily as needed. (Patient taking differently: Apply 1 application  topically 2 (two) times daily as needed (neuropathy).) 35.44 g 3   MAGNESIUM PO Take 400 mg by mouth daily.     Multiple Vitamins-Minerals (CENTRUM) tablet Take 1 tablet by mouth daily. 50 +     nitroGLYCERIN (NITROSTAT) 0.4 MG SL tablet Place 1 tablet (0.4 mg total) under the tongue every 5 (five) minutes as needed. 25 tablet 2   Omega-3 Fatty Acids (FISH OIL) 1200 MG CAPS Take 3,600 mg by mouth daily. 3     pantoprazole (PROTONIX) 40 MG tablet Take 40 mg by mouth daily.      pregabalin (LYRICA) 100 MG capsule One in the am, one at noon and 2 tabs at night 120 capsule 5   rosuvastatin (CRESTOR) 40 MG tablet Take 1 tablet (40 mg total) by mouth daily. 90 tablet 1   sildenafil (REVATIO) 20 MG tablet Take 20-100 mg by mouth daily as needed (ED).     No current facility-administered medications for this visit.    SUMMARY OF ONCOLOGIC HISTORY:  This is a pleasant patient with severe peripheral neuropathy was referred here for evaluation of abnormal serum protein electrophoresis. Further studies revealed monoclonal gammopathy of unknown significance that is not the cause of his peripheral neuropathy, IgA kappa subtype. The peripheral neuropathy was thought to be related to alcoholism. He is being observed.  PHYSICAL  EXAMINATION: ECOG PERFORMANCE STATUS: 1 - Symptomatic but completely ambulatory  Vitals:   03/27/22 0807  BP: (!) 124/55  Pulse: (!) 55  Resp: 18  Temp: 97.7 F (36.5 C)  SpO2: 100%   Filed Weights   03/27/22 0807  Weight: 245 lb (111.1 kg)    GENERAL:alert, no distress and comfortable NEURO: alert & oriented x 3 with fluent speech, no focal motor/sensory deficits  LABORATORY DATA:  I have reviewed the data as listed    Component Value Date/Time   NA 140 03/20/2022 0803   NA 139 01/12/2017 1011   K 4.1 03/20/2022 0803   K 4.3 01/12/2017 1011   CL 106 03/20/2022 0803   CO2 27 03/20/2022 0803   CO2 23 01/12/2017 1011   GLUCOSE 106 (H) 03/20/2022 0803   GLUCOSE 98 01/12/2017 1011   BUN 13 03/20/2022 0803   BUN 21.8 01/12/2017 1011   CREATININE 0.94  03/20/2022 0803   CREATININE 0.9 01/12/2017 1011   CALCIUM 9.6 03/20/2022 0803   CALCIUM 9.0 01/12/2017 1011   PROT 7.1 03/20/2022 0803   PROT 6.4 01/12/2017 1011   PROT 6.1 01/12/2017 1011   ALBUMIN 4.3 03/20/2022 0803   ALBUMIN 3.6 01/12/2017 1011   AST 17 03/20/2022 0803   AST 20 01/12/2017 1011   ALT 13 03/20/2022 0803   ALT 18 01/12/2017 1011   ALKPHOS 82 03/20/2022 0803   ALKPHOS 81 01/12/2017 1011   BILITOT 0.5 03/20/2022 0803   BILITOT 0.58 01/12/2017 1011   GFRNONAA >60 03/20/2022 0803   GFRAA >60 02/14/2020 1115    No results found for: "SPEP", "UPEP"  Lab Results  Component Value Date   WBC 6.7 03/20/2022   NEUTROABS 3.9 03/20/2022   HGB 12.9 (L) 03/20/2022   HCT 37.2 (L) 03/20/2022   MCV 90.5 03/20/2022   PLT 214 03/20/2022      Chemistry      Component Value Date/Time   NA 140 03/20/2022 0803   NA 139 01/12/2017 1011   K 4.1 03/20/2022 0803   K 4.3 01/12/2017 1011   CL 106 03/20/2022 0803   CO2 27 03/20/2022 0803   CO2 23 01/12/2017 1011   BUN 13 03/20/2022 0803   BUN 21.8 01/12/2017 1011   CREATININE 0.94 03/20/2022 0803   CREATININE 0.9 01/12/2017 1011      Component Value  Date/Time   CALCIUM 9.6 03/20/2022 0803   CALCIUM 9.0 01/12/2017 1011   ALKPHOS 82 03/20/2022 0803   ALKPHOS 81 01/12/2017 1011   AST 17 03/20/2022 0803   AST 20 01/12/2017 1011   ALT 13 03/20/2022 0803   ALT 18 01/12/2017 1011   BILITOT 0.5 03/20/2022 0803   BILITOT 0.58 01/12/2017 1011

## 2022-03-27 NOTE — Assessment & Plan Note (Signed)
Clinically, he has no presence of disease progression. His M protein is only marginally elevated However, the patient complains of slight worsening back pain recently I will order skeletal survey His skeletal survey is negative for any abnormal changes, I will see him in 6 months

## 2022-03-27 NOTE — Telephone Encounter (Signed)
Called and given appt for 11/3 at [facility] for bone survey at 9 am, arrive at [facility]. He verbalized understanding.

## 2022-03-27 NOTE — Assessment & Plan Note (Signed)
This is likely anemia of chronic disease. The patient denies recent history of bleeding such as epistaxis, hematuria or hematochezia. He is asymptomatic from the anemia. We will observe for now.  He does not require transfusion now. I do not recommend any further work-up at this time.  Likely related to his MGUS Observe only

## 2022-04-03 ENCOUNTER — Ambulatory Visit (HOSPITAL_COMMUNITY)
Admission: RE | Admit: 2022-04-03 | Discharge: 2022-04-03 | Disposition: A | Discharge status: Home / Self Care | Payer: Medicare Other | Source: Ambulatory Visit | Attending: Hematology and Oncology | Admitting: Hematology and Oncology

## 2022-04-03 DIAGNOSIS — D472 Monoclonal gammopathy: Secondary | ICD-10-CM

## 2022-04-06 ENCOUNTER — Telehealth: Payer: Self-pay | Admitting: Hematology and Oncology

## 2022-04-06 NOTE — Telephone Encounter (Signed)
I reviewed results of the skeletal survey with the patient No lytic lesions seen I will see him in April as scheduled He expressed understanding

## 2022-04-08 NOTE — Telephone Encounter (Signed)
Patient returned the call.Marland Kitchen  HST- [employer] medicare no auth req pt chose   He is scheduled at [facility] for 04/28/22 at 1:30 pm.  Mailed packet to the patient.

## 2022-04-15 DIAGNOSIS — M4800 Spinal stenosis, site unspecified: Secondary | ICD-10-CM | POA: Diagnosis not present

## 2022-04-15 DIAGNOSIS — Z23 Encounter for immunization: Secondary | ICD-10-CM | POA: Diagnosis not present

## 2022-04-15 DIAGNOSIS — E559 Vitamin D deficiency, unspecified: Secondary | ICD-10-CM | POA: Diagnosis not present

## 2022-04-15 DIAGNOSIS — H409 Unspecified glaucoma: Secondary | ICD-10-CM | POA: Diagnosis not present

## 2022-04-15 DIAGNOSIS — G2581 Restless legs syndrome: Secondary | ICD-10-CM | POA: Diagnosis not present

## 2022-04-15 DIAGNOSIS — R7309 Other abnormal glucose: Secondary | ICD-10-CM | POA: Diagnosis not present

## 2022-04-15 DIAGNOSIS — I2510 Atherosclerotic heart disease of native coronary artery without angina pectoris: Secondary | ICD-10-CM | POA: Diagnosis not present

## 2022-04-15 DIAGNOSIS — D472 Monoclonal gammopathy: Secondary | ICD-10-CM | POA: Diagnosis not present

## 2022-04-15 DIAGNOSIS — K219 Gastro-esophageal reflux disease without esophagitis: Secondary | ICD-10-CM | POA: Diagnosis not present

## 2022-04-15 DIAGNOSIS — G629 Polyneuropathy, unspecified: Secondary | ICD-10-CM | POA: Diagnosis not present

## 2022-04-15 DIAGNOSIS — E041 Nontoxic single thyroid nodule: Secondary | ICD-10-CM | POA: Diagnosis not present

## 2022-04-15 DIAGNOSIS — Z0000 Encounter for general adult medical examination without abnormal findings: Secondary | ICD-10-CM | POA: Diagnosis not present

## 2022-04-15 DIAGNOSIS — E782 Mixed hyperlipidemia: Secondary | ICD-10-CM | POA: Diagnosis not present

## 2022-04-20 ENCOUNTER — Other Ambulatory Visit: Payer: Self-pay | Admitting: Cardiology

## 2022-04-20 NOTE — Telephone Encounter (Signed)
This is Dr. Hochrein's pt. °

## 2022-04-28 ENCOUNTER — Ambulatory Visit (INDEPENDENT_AMBULATORY_CARE_PROVIDER_SITE_OTHER): Payer: Medicare Other | Admitting: Neurology

## 2022-04-28 DIAGNOSIS — Z9189 Other specified personal risk factors, not elsewhere classified: Secondary | ICD-10-CM

## 2022-04-28 DIAGNOSIS — G2581 Restless legs syndrome: Secondary | ICD-10-CM

## 2022-04-28 DIAGNOSIS — Z955 Presence of coronary angioplasty implant and graft: Secondary | ICD-10-CM

## 2022-04-28 DIAGNOSIS — G4733 Obstructive sleep apnea (adult) (pediatric): Secondary | ICD-10-CM | POA: Diagnosis not present

## 2022-04-28 DIAGNOSIS — G4719 Other hypersomnia: Secondary | ICD-10-CM

## 2022-04-28 DIAGNOSIS — E669 Obesity, unspecified: Secondary | ICD-10-CM

## 2022-04-28 DIAGNOSIS — R0683 Snoring: Secondary | ICD-10-CM

## 2022-04-29 ENCOUNTER — Ambulatory Visit: Payer: Medicare Other | Admitting: Cardiology

## 2022-04-30 NOTE — Progress Notes (Signed)
See procedure note.

## 2022-05-01 ENCOUNTER — Ambulatory Visit: Payer: Medicare Other | Admitting: Cardiology

## 2022-05-03 DIAGNOSIS — I10 Essential (primary) hypertension: Secondary | ICD-10-CM | POA: Insufficient documentation

## 2022-05-03 NOTE — Procedures (Signed)
   [facility]  HOME SLEEP TEST (Watch PAT) REPORT  STUDY DATE: 04/28/22  DOB: Mar 03, 1946  MRN: [id number]  ORDERING CLINICIAN: Star Age, MD, PhD   REFERRING CLINICIAN: Dr. Krista Blue  CLINICAL INFORMATION/HISTORY: 76 year old male with a history of MGUS, neuropathy, restless leg syndrome, spinal stenosis, reflux disease, coronary artery disease with status post coronary stent placement in May 2023, arthritis, anemia, and obesity, who reports snoring and excessive daytime somnolence.   Epworth sleepiness score: 6/24.  BMI: 33.3 kg/m  FINDINGS:   Sleep Summary:   Total Recording Time (hours, min): 7 hours, 43 min  Total Sleep Time (hours, min):  6 hours, 41 min  Percent REM (%):    17.5%   Respiratory Indices:   Calculated pAHI (per hour):  5.4/hour         REM pAHI:    11.2/hour       NREM pAHI: 4.2/hour  Central pAHI: 0/hour  Oxygen Saturation Statistics:    Oxygen Saturation (%) Mean: 95%   Minimum oxygen saturation (%):                 84%   O2 Saturation Range (%): 84 - 99%    O2 Saturation (minutes) <=88%: 0.9 min  Pulse Rate Statistics:   Pulse Mean (bpm):    60/min    Pulse Range (45 - 123/min)   IMPRESSION: OSA (obstructive sleep apnea), mild  RECOMMENDATION:  This HST shows borderline obstructive sleep apnea, with an AHI of 5.4/hour and O2 nadir of 84%. Intermittent snoring was noted, in the mild to moderate range; snore and position channel was off in the last one third of the test. Treatment with positive airway pressure can be considered with autoPAP, if desired by patient. Treatment options otherwise include weight loss and avoidance of the supine sleep position or a dental device. These different avenues will be discussed with the patient. The patient will be seen in follow up in sleep clinic, if necessary. Please note, that other causes of the patient's symptoms, including circadian rhythm disturbances, an underlying mood  disorder, medication effect and/or an underlying medical problem cannot be ruled out based on this test. Clinical correlation is recommended. The patient should be cautioned not to drive, work at heights, or operate dangerous or heavy equipment when tired or sleepy. Review and reiteration of good sleep hygiene measures should be pursued with any patient. The referring provider will be notified of the test results.   I certify that I have reviewed the raw data recording prior to the issuance of this report in accordance with the standards of the American Academy of Sleep Medicine (AASM).  INTERPRETING PHYSICIAN:   Star Age, MD, PhD  Board Certified in Neurology and Sleep Medicine  [facility] [address], [place], [place] [address] [phone]

## 2022-05-03 NOTE — Progress Notes (Unsigned)
Cardiology Office Note   Date:  05/05/2022   ID:  Daniel Whitaker, DOB 11-08-1945, MRN [id number]  PCP:  [name], MD  Cardiologist:   Minus Breeding, MD   Chief Complaint  Patient presents with   Coronary Artery Disease      History of Present Illness: Daniel Whitaker is a 76 y.o. male who presents for follow up of CAD, hyperlipidemia and obesity.  He was seen for dyspnea on exertion.  He had shortness of breath thought to be secondary to obesity and deconditioning.  Exercise treadmill test was ordered and showed ST depression and frequent PVCs.  Left heart cath in May 2023 showed 85% prox LAD disease - DES was placed with 0% residual stenosis.    Since I last saw him he has done well.  The patient denies any new symptoms such as chest discomfort, neck or arm discomfort. There has been no new shortness of breath, PND or orthopnea. There have been no reported palpitations, presyncope or syncope.   Past Medical History:  Diagnosis Date   Arthritis    Cataract    removed bilaterally    Coronary artery disease    Erectile dysfunction    Esophageal reflux    Glaucoma    Hypertriglyceridemia    Impaired fasting glucose    MGUS (monoclonal gammopathy of unknown significance)    Neuropathy associated with MGUS ([place]) 07/11/2013   Nocturnal leg cramps 03/11/2021   Nontoxic uninodular goiter    Obesity    Peripheral neuropathy    RLS (restless legs syndrome)    Spinal stenosis    Unspecified deficiency anemia 07/11/2013    Past Surgical History:  Procedure Laterality Date   CARDIAC CATHETERIZATION     COLON RESECTION N/A 04/21/2013   Procedure: DIAGNOSTIC LAPAROSCOPY, lysis of adhesions for partial small bowel obstruction;  Surgeon: Odis Hollingshead, MD;  Location: [facility];  Service: General;  Laterality: N/A;   COLON SURGERY     partial SBO    COLONOSCOPY     CORONARY STENT INTERVENTION N/A 10/22/2021   Procedure: CORONARY STENT INTERVENTION;  Surgeon:  Leonie Man, MD;  Location: [facility];  Service: Cardiovascular;  Laterality: N/A;   KNEE SURGERY     Arthroscopic   LEFT HEART CATH AND CORONARY ANGIOGRAPHY N/A 10/22/2021   Procedure: LEFT HEART CATH AND CORONARY ANGIOGRAPHY;  Surgeon: Leonie Man, MD;  Location: [facility];  Service: Cardiovascular;  Laterality: N/A;   SMALL INTESTINE SURGERY     Blockage     Current Outpatient Medications  Medication Sig Dispense Refill   aspirin EC 81 MG tablet Take 1 tablet (81 mg total) by mouth daily. Swallow whole. 30 tablet 0   brimonidine (ALPHAGAN) 0.2 % ophthalmic solution Place 1 drop into both eyes in the morning and at bedtime.     carvedilol (COREG) 3.125 MG tablet Take 1 tablet (3.125 mg total) by mouth 2 (two) times daily. 60 tablet 4   cetirizine (ZYRTEC) 10 MG tablet Take 10 mg by mouth every morning.     Cholecalciferol (VITAMIN D) 2000 UNITS CAPS Take 2,000 Units by mouth daily.     clopidogrel (PLAVIX) 75 MG tablet Take 1 tablet (75 mg total) by mouth daily. 90 tablet 1   Cyanocobalamin (VITAMIN B-12) 5000 MCG TBDP Take 10,000 mcg by mouth every morning.     dorzolamide-timolol (COSOPT) 22.3-6.8 MG/ML ophthalmic solution Place 1 drop into the left eye 2 (two)  times daily.     ketoconazole (NIZORAL) 2 % cream Apply 1 application  topically daily as needed for irritation.     latanoprost (XALATAN) 0.005 % ophthalmic solution Place 1 drop into both eyes at bedtime.     lidocaine (XYLOCAINE) 5 % ointment Apply 1 application topically 2 (two) times daily as needed. (Patient taking differently: Apply 1 application  topically 2 (two) times daily as needed (neuropathy).) 35.44 g 3   MAGNESIUM PO Take 400 mg by mouth daily.     Multiple Vitamins-Minerals (CENTRUM) tablet Take 1 tablet by mouth daily. 50 +     nitroGLYCERIN (NITROSTAT) 0.4 MG SL tablet Place 1 tablet (0.4 mg total) under the tongue every 5 (five) minutes as needed. 25 tablet 2   Omega-3 Fatty Acids  (FISH OIL) 1200 MG CAPS Take 3,600 mg by mouth daily. 3     pantoprazole (PROTONIX) 40 MG tablet Take 40 mg by mouth daily.      pregabalin (LYRICA) 100 MG capsule One in the am, one at noon and 2 tabs at night 120 capsule 5   rosuvastatin (CRESTOR) 40 MG tablet TAKE 1 TABLET BY MOUTH DAILY 90 tablet 1   sildenafil (REVATIO) 20 MG tablet Take 20-100 mg by mouth daily as needed (ED).     No current facility-administered medications for this visit.    Allergies:   Cymbalta [duloxetine hcl], Lipitor [atorvastatin], and Penicillins    ROS:  Please see the history of present illness.   Otherwise, review of systems are positive for none.   All other systems are reviewed and negative.    PHYSICAL EXAM: VS:  BP 126/64 (BP Location: Left Arm, Patient Position: Sitting, Cuff Size: Large)   Pulse (!) 56   Ht '6\' 1"'$  (1.854 m)   Wt 247 lb (112 kg)   BMI 32.59 kg/m  , BMI Body mass index is 32.59 kg/m. GENERAL:  Well appearing NECK:  No jugular venous distention, waveform within normal limits, carotid upstroke brisk and symmetric, no bruits, no thyromegaly LUNGS:  Clear to auscultation bilaterally CHEST:  Unremarkable HEART:  PMI not displaced or sustained,S1 and S2 within normal limits, no S3, no S4, no clicks, no rubs, no murmurs ABD:  Flat, positive bowel sounds normal in frequency in pitch, no bruits, no rebound, no guarding, no midline pulsatile mass, no hepatomegaly, no splenomegaly EXT:  2 plus pulses throughout, no edema, no cyanosis no clubbing  EKG:  EKG is not ordered today.   Recent Labs: 03/20/2022: ALT 13; BUN 13; Creatinine, Ser 0.94; Hemoglobin 12.9; Platelet Count 214; Potassium 4.1; Sodium 140 03/23/2022: TSH 1.940    Lipid Panel No results found for: "CHOL", "TRIG", "HDL", "CHOLHDL", "VLDL", "LDLCALC", "LDLDIRECT"    Wt Readings from Last 3 Encounters:  05/05/22 247 lb (112 kg)  03/27/22 245 lb (111.1 kg)  03/19/22 251 lb 9.6 oz (114.1 kg)      Other studies  Reviewed: Additional studies/ records that were reviewed today include: None. Review of the above records demonstrates:  Please see elsewhere in the note.     ASSESSMENT AND PLAN:  Coronary artery disease:   The patient has no new sypmtoms.  No further cardiovascular testing is indicated.  We will continue with aggressive risk reduction and meds as listed.  He will stop his Plavix in May.   Hypertension:   The blood pressure is at target. No change in medications is indicated. We will continue with therapeutic lifestyle changes (TLC).  Hyperlipidemia:  LDL is 44.  No change in therapy    Current medicines are reviewed at length with the patient today.  The patient does not have concerns regarding medicines.  The following changes have been made:  no change  Labs/ tests ordered today include: None No orders of the defined types were placed in this encounter.    Disposition:   FU with me in 12 months.      Signed, Minus Breeding, MD  05/05/2022 2:06 PM    [place]

## 2022-05-04 ENCOUNTER — Other Ambulatory Visit: Payer: Self-pay

## 2022-05-04 ENCOUNTER — Ambulatory Visit: Payer: Medicare Other | Admitting: Orthopaedic Surgery

## 2022-05-04 ENCOUNTER — Encounter: Payer: Self-pay | Admitting: Orthopaedic Surgery

## 2022-05-04 DIAGNOSIS — G8929 Other chronic pain: Secondary | ICD-10-CM

## 2022-05-04 DIAGNOSIS — M25562 Pain in left knee: Secondary | ICD-10-CM

## 2022-05-04 MED ORDER — METHYLPREDNISOLONE ACETATE 40 MG/ML IJ SUSP
40.0000 mg | INTRAMUSCULAR | Status: AC | PRN
  Administered 2022-05-04: 40 mg via INTRA_ARTICULAR

## 2022-05-04 MED ORDER — LIDOCAINE HCL 1 % IJ SOLN
3.0000 mL | INTRAMUSCULAR | Status: AC | PRN
  Administered 2022-05-04: 3 mL

## 2022-05-04 NOTE — Progress Notes (Signed)
The patient is a 76 year old gentleman who continues to have chronic pain with his left knee.  He said it was a constant pain but hyaluronic acid did help subside that some.  He still has pain with weightbearing activities with no swelling.  He says getting up from seated position and down on his knee is very helpful and slight movement still hurt the knee quite a bit.  Examination of his knee today shows posterior medial and posterior lateral tenderness with pain throughout arc of motion of his knee and no effusion.  I did recommend a steroid injection in his knee today just to calm down some of the acute pain he is experiencing.  We do need to obtain an MRI of his knee to assess the cartilage based on plain film showing still decently maintained joint space.  We can assess the meniscus as well.  He is requesting an MRI as well.  He did tolerate steroid injection well.  Will see him back after the MRI of his left knee.  All questions and concerns were answered and addressed.      Procedure Note  Patient: Daniel Whitaker             Date of Birth: [birth year]           MRN: [id number]             Visit Date: 05/04/2022  Procedures: Visit Diagnoses:  1. Chronic pain of left knee     Large Joint Inj: L knee on 05/04/2022 9:23 AM Indications: diagnostic evaluation and pain Details: 22 G 1.5 in needle, superolateral approach  Arthrogram: No  Medications: 3 mL lidocaine 1 %; 40 mg methylPREDNISolone acetate 40 MG/ML Outcome: tolerated well, no immediate complications Procedure, treatment alternatives, risks and benefits explained, specific risks discussed. Consent was given by the patient. Immediately prior to procedure a time out was called to verify the correct patient, procedure, equipment, support staff and site/side marked as required. Patient was prepped and draped in the usual sterile fashion.

## 2022-05-05 ENCOUNTER — Encounter: Payer: Self-pay | Admitting: Cardiology

## 2022-05-05 ENCOUNTER — Ambulatory Visit: Payer: Medicare Other | Attending: Cardiology | Admitting: Cardiology

## 2022-05-05 VITALS — BP 126/64 | HR 56 | Ht 73.0 in | Wt 247.0 lb

## 2022-05-05 DIAGNOSIS — E785 Hyperlipidemia, unspecified: Secondary | ICD-10-CM | POA: Diagnosis not present

## 2022-05-05 DIAGNOSIS — I2510 Atherosclerotic heart disease of native coronary artery without angina pectoris: Secondary | ICD-10-CM | POA: Diagnosis not present

## 2022-05-05 DIAGNOSIS — I10 Essential (primary) hypertension: Secondary | ICD-10-CM

## 2022-05-05 NOTE — Patient Instructions (Signed)
   Follow-Up: At [facility], you and your health needs are our priority.  As part of our continuing mission to provide you with exceptional heart care, we have created designated Provider Care Teams.  These Care Teams include your primary Cardiologist (physician) and Advanced Practice Providers (APPs -  Physician Assistants and Nurse Practitioners) who all work together to provide you with the care you need, when you need it.  We recommend signing up for the patient portal called "MyChart".  Sign up information is provided on this After Visit Summary.  MyChart is used to connect with patients for Virtual Visits (Telemedicine).  Patients are able to view lab/test results, encounter notes, upcoming appointments, etc.  Non-urgent messages can be sent to your provider as well.   To learn more about what you can do with MyChart, go to [email or web address].    Your next appointment:   12 month(s)  The format for your next appointment:   In Person  Provider:   James Hochrein, MD     

## 2022-05-07 ENCOUNTER — Telehealth: Payer: Self-pay | Admitting: *Deleted

## 2022-05-07 DIAGNOSIS — G4733 Obstructive sleep apnea (adult) (pediatric): Secondary | ICD-10-CM

## 2022-05-07 NOTE — Telephone Encounter (Signed)
Spoke with the patient and discussed his sleep study results.  Patient is amenable to trying AutoPap.  He is concerned about claustrophobia with the mask but he understands there are different types of mask some perhaps less restrictive than others.  He will give this a try.  We discussed insurance compliance requirements which includes using the machine at least 4 hours at night and also being seen in our office between 30 and 90 days after set up.  Patient will receive a call within a week from [employer].  [place] in [place] and closest city to him is [place].  I let him know our office would contact him to schedule the initial follow-up appointment.  He verbalized appreciation.   Request for autopap order sent to Dr Rexene Alberts. Will send to [employer] asap.

## 2022-05-07 NOTE — Telephone Encounter (Signed)
-----   Message from Star Age, MD sent at 05/04/2022  8:04 AM EST ----- Patient was referred by Dr. Krista Blue for evaluation of his sleep apnea concern, I saw him on 03/19/2022.  He had a home sleep test on 04/28/2022 which showed rather mild or borderline sleep apnea with an AHI of 5.4/h, O2 nadir 84%.  We can consider an AutoPap therapy machine if he would like.  Alternative treatment options include weight loss and avoiding sleeping on the back or a dental device, but I am not sure he would be a good candidate for a dental device due to bridges.  If he would like to proceed with AutoPap therapy, I would be happy to prescribe the machine.  He will get the machine through her DME company and will need a follow-up within 1 to 3 months of starting treatment.  Please go over compliance expectations especially due to insurance requirement.  Let me know how he would like to proceed.

## 2022-05-08 NOTE — Addendum Note (Signed)
Addended by: Star Age on: 05/08/2022 08:20 AM   Modules accepted: Orders

## 2022-05-08 NOTE — Telephone Encounter (Signed)
AutoPAP order placed.  

## 2022-05-11 NOTE — Telephone Encounter (Signed)
Autopap order sent to [employer] via secure community message.

## 2022-05-12 NOTE — Telephone Encounter (Signed)
[employer] confirmed receipt of order.  

## 2022-05-19 ENCOUNTER — Other Ambulatory Visit: Payer: Self-pay | Admitting: Cardiology

## 2022-05-20 ENCOUNTER — Telehealth: Payer: Self-pay | Admitting: Orthopaedic Surgery

## 2022-05-20 NOTE — Telephone Encounter (Signed)
Patient would like to be seen for his MRI review before 06/15/2022. Would like a call if Dr. Ninfa [place] gets an opening after 06/02/2022

## 2022-05-21 ENCOUNTER — Other Ambulatory Visit: Payer: Self-pay

## 2022-05-21 MED ORDER — CARVEDILOL 3.125 MG PO TABS: 3.1250 mg | ORAL_TABLET | Freq: Two times a day (BID) | ORAL | 3 refills | Status: DC

## 2022-05-22 NOTE — Telephone Encounter (Signed)
Patient states the patella painful feeling still there when he walks and bends his knee You are completely booked next week He has an appt 06/17/22

## 2022-05-27 NOTE — Telephone Encounter (Signed)
Scheduled him for 06/08/22. NVM advising pt

## 2022-06-02 ENCOUNTER — Ambulatory Visit
Admission: RE | Admit: 2022-06-02 | Discharge: 2022-06-02 | Disposition: A | Discharge status: Home / Self Care | Payer: Medicare Other | Source: Ambulatory Visit | Attending: Orthopaedic Surgery | Admitting: Orthopaedic Surgery

## 2022-06-02 DIAGNOSIS — M25462 Effusion, left knee: Secondary | ICD-10-CM | POA: Diagnosis not present

## 2022-06-02 DIAGNOSIS — M1712 Unilateral primary osteoarthritis, left knee: Secondary | ICD-10-CM | POA: Diagnosis not present

## 2022-06-02 DIAGNOSIS — G8929 Other chronic pain: Secondary | ICD-10-CM

## 2022-06-02 DIAGNOSIS — R600 Localized edema: Secondary | ICD-10-CM | POA: Diagnosis not present

## 2022-06-03 DIAGNOSIS — R300 Dysuria: Secondary | ICD-10-CM | POA: Diagnosis not present

## 2022-06-03 DIAGNOSIS — R059 Cough, unspecified: Secondary | ICD-10-CM | POA: Diagnosis not present

## 2022-06-03 DIAGNOSIS — R062 Wheezing: Secondary | ICD-10-CM | POA: Diagnosis not present

## 2022-06-03 DIAGNOSIS — Z03818 Encounter for observation for suspected exposure to other biological agents ruled out: Secondary | ICD-10-CM | POA: Diagnosis not present

## 2022-06-03 DIAGNOSIS — R509 Fever, unspecified: Secondary | ICD-10-CM | POA: Diagnosis not present

## 2022-06-08 ENCOUNTER — Ambulatory Visit: Payer: Medicare Other | Admitting: Orthopaedic Surgery

## 2022-06-08 ENCOUNTER — Encounter: Payer: Self-pay | Admitting: Orthopaedic Surgery

## 2022-06-08 DIAGNOSIS — M1712 Unilateral primary osteoarthritis, left knee: Secondary | ICD-10-CM | POA: Diagnosis not present

## 2022-06-08 DIAGNOSIS — G8929 Other chronic pain: Secondary | ICD-10-CM

## 2022-06-08 DIAGNOSIS — M25562 Pain in left knee: Secondary | ICD-10-CM

## 2022-06-08 NOTE — Progress Notes (Signed)
The patient is a 77 year old gentleman who comes in today to go over MRI of his left knee.  He does state that the steroid injection we provided just over a month ago on that left knee has helped him quite a bit he still does not have much pain in his left knee.  His left knee has slight varus malalignment that is correctable there is no effusion today.  There are some global tenderness but he tolerates it very well today.  Again he said he is doing great right now in terms of the knee pain.  He is on Plavix so he cannot take anti-inflammatories.  MRIs reviewed with him of his left knee and his medial lateral meniscus were completely intact.  What he does have those areas of full-thickness cartilage loss in all 3 compartments of the knee.  This is definitely consistent with osteoarthritis and explained this to him in detail.  Since he is doing so well with the steroid injections, we will continue conservative treatment for now.  He knows to wait at least 3 to 4 months between steroid injections.  The earliest he could have this again would be in early March if he gets to where it is bothering him enough he will let us know.  All questions and concerns were addressed and answered.

## 2022-06-10 DIAGNOSIS — G4733 Obstructive sleep apnea (adult) (pediatric): Secondary | ICD-10-CM | POA: Diagnosis not present

## 2022-06-15 ENCOUNTER — Ambulatory Visit: Payer: Medicare Other | Admitting: Orthopaedic Surgery

## 2022-06-17 DIAGNOSIS — G4733 Obstructive sleep apnea (adult) (pediatric): Secondary | ICD-10-CM | POA: Diagnosis not present

## 2022-06-18 DIAGNOSIS — H401123 Primary open-angle glaucoma, left eye, severe stage: Secondary | ICD-10-CM | POA: Diagnosis not present

## 2022-06-18 DIAGNOSIS — H04123 Dry eye syndrome of bilateral lacrimal glands: Secondary | ICD-10-CM | POA: Diagnosis not present

## 2022-06-18 DIAGNOSIS — H26493 Other secondary cataract, bilateral: Secondary | ICD-10-CM | POA: Diagnosis not present

## 2022-06-18 DIAGNOSIS — H43391 Other vitreous opacities, right eye: Secondary | ICD-10-CM | POA: Diagnosis not present

## 2022-06-18 DIAGNOSIS — H1045 Other chronic allergic conjunctivitis: Secondary | ICD-10-CM | POA: Diagnosis not present

## 2022-06-18 DIAGNOSIS — Z961 Presence of intraocular lens: Secondary | ICD-10-CM | POA: Diagnosis not present

## 2022-07-11 DIAGNOSIS — G4733 Obstructive sleep apnea (adult) (pediatric): Secondary | ICD-10-CM | POA: Diagnosis not present

## 2022-07-18 DIAGNOSIS — G4733 Obstructive sleep apnea (adult) (pediatric): Secondary | ICD-10-CM | POA: Diagnosis not present

## 2022-08-03 ENCOUNTER — Encounter: Payer: Self-pay | Admitting: Physician Assistant

## 2022-08-03 ENCOUNTER — Ambulatory Visit (INDEPENDENT_AMBULATORY_CARE_PROVIDER_SITE_OTHER): Payer: Medicare Other | Admitting: Physician Assistant

## 2022-08-03 DIAGNOSIS — M1712 Unilateral primary osteoarthritis, left knee: Secondary | ICD-10-CM

## 2022-08-03 MED ORDER — METHYLPREDNISOLONE ACETATE 40 MG/ML IJ SUSP
40.0000 mg | INTRAMUSCULAR | Status: AC | PRN
  Administered 2022-08-03: 40 mg via INTRA_ARTICULAR

## 2022-08-03 MED ORDER — LIDOCAINE HCL 1 % IJ SOLN
3.0000 mL | INTRAMUSCULAR | Status: AC | PRN
  Administered 2022-08-03: 3 mL

## 2022-08-03 NOTE — Progress Notes (Signed)
   Procedure Note  Patient: Daniel Whitaker             Date of Birth: [birth year]           MRN: [id number]             Visit Date: 08/03/2022 HPI: Daniel Whitaker returns today requesting cortisone injections left knee.  Last injection was 05/04/2022.  He states the injection helped until 3 weeks ago and until his pain came back.  States is not as bad as it was before.  Denies any injury.  Denies any fevers chills.  Given MRI of his knee showed full-thickness cartilage loss in all 3 compartments of his knee.  Review of systems: He is nondiabetic.  Denies any fevers chills. Physical exam: General well-developed well-nourished male no acute distress.  Left knee good range of motion.  No abnormal warmth erythema or effusion.  Procedures: Visit Diagnoses:  1. Unilateral primary osteoarthritis, left knee     Large Joint Inj on 08/03/2022 4:51 PM Indications: pain Details: 22 G 1.5 in needle, anterolateral approach  Arthrogram: No  Medications: 3 mL lidocaine 1 %; 40 mg methylPREDNISolone acetate 40 MG/ML Outcome: tolerated well, no immediate complications Procedure, treatment alternatives, risks and benefits explained, specific risks discussed. Consent was given by the patient. Immediately prior to procedure a time out was called to verify the correct patient, procedure, equipment, support staff and site/side marked as required. Patient was prepped and draped in the usual sterile fashion.     Plan: He knows to wait at least 3 months between injections.  Follow-up with [place] as needed.  Questions encouraged and answered at length

## 2022-08-05 NOTE — Progress Notes (Unsigned)
PATIENT: Daniel Whitaker DOB: 1946/05/30  REASON FOR VISIT: follow up HISTORY FROM: patient PRIMARY NEUROLOGIST: Dr. Krista Blue Sleep Neurologist: Dr. Rexene Alberts  Chief Complaint  Patient presents with   Follow-up    Rm 4, alone. Doing ok, no concerns.       HISTORY OF PRESENT ILLNESS: Today 08/06/22:  Daniel Whitaker is a 77 y.o. male with a history of obstructive sleep apnea on CPAP. Returns today for follow-up.  He returns today for his initial CPAP download. Still trying to get used to the machine. Not sure that he can see the benefit of using the CPAP yet.   In regards to his neuropathy.  Using Lidocaine cream as needed- usually uses on the right toe if it starts burning.  Now taking Lyrica only in the AM and PM. No falls but does feel that his balance is off. Tends to veer to the left.  Had nerve conduction studies with EMG last year.  Results are in epic.     REVIEW OF SYSTEMS: Out of a complete 14 system review of symptoms, the patient complains only of the following symptoms, and all other reviewed systems are negative.   ESS 10  ALLERGIES: Allergies  Allergen Reactions   Cymbalta [Duloxetine Hcl]     Drowsiness, nausea   Lipitor [Atorvastatin] Other (See Comments)    Pt unsure of reaction    Penicillins Hives    HOME MEDICATIONS: Outpatient Medications Prior to Visit  Medication Sig Dispense Refill   aspirin EC 81 MG tablet Take 1 tablet (81 mg total) by mouth daily. Swallow whole. 30 tablet 0   brimonidine (ALPHAGAN) 0.2 % ophthalmic solution Place 1 drop into both eyes in the morning and at bedtime.     carvedilol (COREG) 3.125 MG tablet Take 1 tablet (3.125 mg total) by mouth 2 (two) times daily. 180 tablet 3   cetirizine (ZYRTEC) 10 MG tablet Take 10 mg by mouth every morning.     Cholecalciferol (VITAMIN D) 2000 UNITS CAPS Take 2,000 Units by mouth daily.     clopidogrel (PLAVIX) 75 MG tablet Take 1 tablet (75 mg total) by mouth daily. 90 tablet 3    Cyanocobalamin (VITAMIN B-12) 5000 MCG TBDP Take 10,000 mcg by mouth every morning.     dorzolamide-timolol (COSOPT) 22.3-6.8 MG/ML ophthalmic solution Place 1 drop into the left eye 2 (two) times daily.     ketoconazole (NIZORAL) 2 % cream Apply 1 application  topically daily as needed for irritation.     latanoprost (XALATAN) 0.005 % ophthalmic solution Place 1 drop into both eyes at bedtime.     lidocaine (XYLOCAINE) 5 % ointment Apply 1 application topically 2 (two) times daily as needed. (Patient taking differently: Apply 1 application  topically 2 (two) times daily as needed (neuropathy).) 35.44 g 3   MAGNESIUM PO Take 400 mg by mouth daily.     Multiple Vitamins-Minerals (CENTRUM) tablet Take 1 tablet by mouth daily. 50 +     nitroGLYCERIN (NITROSTAT) 0.4 MG SL tablet Place 1 tablet (0.4 mg total) under the tongue every 5 (five) minutes as needed. 25 tablet 2   Omega-3 Fatty Acids (FISH OIL) 1200 MG CAPS Take 3,600 mg by mouth daily. 3     pantoprazole (PROTONIX) 40 MG tablet Take 40 mg by mouth daily.      pregabalin (LYRICA) 100 MG capsule One in the am, one at noon and 2 tabs at night 120 capsule 5   rosuvastatin (CRESTOR)  40 MG tablet TAKE 1 TABLET BY MOUTH DAILY 90 tablet 1   sildenafil (REVATIO) 20 MG tablet Take 20-100 mg by mouth daily as needed (ED).     No facility-administered medications prior to visit.    PAST MEDICAL HISTORY: Past Medical History:  Diagnosis Date   Arthritis    Cataract    removed bilaterally    Coronary artery disease    Erectile dysfunction    Esophageal reflux    Glaucoma    Hypertriglyceridemia    Impaired fasting glucose    MGUS (monoclonal gammopathy of unknown significance)    Neuropathy associated with MGUS ([place]) 07/11/2013   Nocturnal leg cramps 03/11/2021   Nontoxic uninodular goiter    Obesity    Peripheral neuropathy    RLS (restless legs syndrome)    Spinal stenosis    Unspecified deficiency anemia 07/11/2013    PAST SURGICAL  HISTORY: Past Surgical History:  Procedure Laterality Date   CARDIAC CATHETERIZATION     COLON RESECTION N/A 04/21/2013   Procedure: DIAGNOSTIC LAPAROSCOPY, lysis of adhesions for partial small bowel obstruction;  Surgeon: Odis Hollingshead, MD;  Location: [facility];  Service: General;  Laterality: N/A;   COLON SURGERY     partial SBO    COLONOSCOPY     CORONARY STENT INTERVENTION N/A 10/22/2021   Procedure: CORONARY STENT INTERVENTION;  Surgeon: Leonie Man, MD;  Location: [facility];  Service: Cardiovascular;  Laterality: N/A;   KNEE SURGERY     Arthroscopic   LEFT HEART CATH AND CORONARY ANGIOGRAPHY N/A 10/22/2021   Procedure: LEFT HEART CATH AND CORONARY ANGIOGRAPHY;  Surgeon: Leonie Man, MD;  Location: [facility];  Service: Cardiovascular;  Laterality: N/A;   SMALL INTESTINE SURGERY     Blockage    FAMILY HISTORY: Family History  Problem Relation Age of Onset   Brain cancer Mother    Lung cancer Father    Heart disease Brother        No details   Dementia Brother    Neuropathy Neg Hx    Colon cancer Neg Hx    Colon polyps Neg Hx    Esophageal cancer Neg Hx    Rectal cancer Neg Hx    Stomach cancer Neg Hx    Sleep apnea Neg Hx     SOCIAL HISTORY: Social History   Socioeconomic History   Marital status: Married    Spouse name: [name]   Number of children: 2   Years of education: GED   Highest education level: Not on file  Occupational History   Occupation: still working  Tobacco Use   Smoking status: Former    Types: Cigarettes    Quit date: 06/01/1984    Years since quitting: 38.2   Smokeless tobacco: Never  Vaping Use   Vaping Use: Never used  Substance and Sexual Activity   Alcohol use: Not Currently    Alcohol/week: 10.0 standard drinks of alcohol    Types: 5 Glasses of wine, 5 Cans of beer per week   Drug use: No   Sexual activity: Not on file  Other Topics Concern   Not on file  Social History Narrative   Patient lives at  home with his wife [name])   Retired.    Patient has two children.   Patient is right-handed.   Patient has his GED.   Patient does not drink caffeine.   Social Determinants of Health   Financial Resource Strain: Not on  file  Food Insecurity: Not on file  Transportation Needs: Not on file  Physical Activity: Not on file  Stress: Not on file  Social Connections: Not on file  Intimate Partner Violence: Not on file      PHYSICAL EXAM  Vitals:   08/06/22 0854  BP: 125/73  Pulse: (!) 58  Weight: 252 lb 7.2 oz (114.5 kg)  Height: '6\' 1"'$  (1.854 m)   Body mass index is 33.31 kg/m.  Generalized: Well developed, in no acute distress  Chest: Lungs clear to auscultation bilaterally  Neurological examination  Mentation: Alert oriented to time, place, history taking. Follows all commands speech and language fluent Cranial nerve II-XII: Extraocular movements were full, visual field were full on confrontational test Head turning and shoulder shrug  were normal and symmetric. Motor: The motor testing reveals 5 over 5 strength of all 4 extremities. Good symmetric motor tone is noted throughout.  Sensory: Sensory testing is intact to soft touch on all 4 extremities. No evidence of extinction is noted.  Gait and station: Gait is normal.  Tandem gait not attempted.  Romberg is negative but unsteady.   DIAGNOSTIC DATA (LABS, IMAGING, TESTING) - I reviewed patient records, labs, notes, testing and imaging myself where available.  Lab Results  Component Value Date   WBC 6.7 03/20/2022   HGB 12.9 (L) 03/20/2022   HCT 37.2 (L) 03/20/2022   MCV 90.5 03/20/2022   PLT 214 03/20/2022      Component Value Date/Time   NA 140 03/20/2022 0803   NA 139 01/12/2017 1011   K 4.1 03/20/2022 0803   K 4.3 01/12/2017 1011   CL 106 03/20/2022 0803   CO2 27 03/20/2022 0803   CO2 23 01/12/2017 1011   GLUCOSE 106 (H) 03/20/2022 0803   GLUCOSE 98 01/12/2017 1011   BUN 13 03/20/2022 0803   BUN 21.8  01/12/2017 1011   CREATININE 0.94 03/20/2022 0803   CREATININE 0.9 01/12/2017 1011   CALCIUM 9.6 03/20/2022 0803   CALCIUM 9.0 01/12/2017 1011   PROT 7.1 03/20/2022 0803   PROT 6.4 01/12/2017 1011   PROT 6.1 01/12/2017 1011   ALBUMIN 4.3 03/20/2022 0803   ALBUMIN 3.6 01/12/2017 1011   AST 17 03/20/2022 0803   AST 20 01/12/2017 1011   ALT 13 03/20/2022 0803   ALT 18 01/12/2017 1011   ALKPHOS 82 03/20/2022 0803   ALKPHOS 81 01/12/2017 1011   BILITOT 0.5 03/20/2022 0803   BILITOT 0.58 01/12/2017 1011   GFRNONAA >60 03/20/2022 0803   GFRAA >60 02/14/2020 1115   No results found for: "CHOL", "HDL", "LDLCALC", "LDLDIRECT", "TRIG", "CHOLHDL" Lab Results  Component Value Date   HGBA1C 5.1 03/20/2022   Lab Results  Component Value Date   VITAMINB12 >2000 (H) 03/23/2022   Lab Results  Component Value Date   TSH 1.940 03/23/2022      ASSESSMENT AND PLAN 77 y.o. year old male  has a past medical history of Arthritis, Cataract, Coronary artery disease, Erectile dysfunction, Esophageal reflux, Glaucoma, Hypertriglyceridemia, Impaired fasting glucose, MGUS (monoclonal gammopathy of unknown significance), Neuropathy associated with MGUS ([place]) (07/11/2013), Nocturnal leg cramps (03/11/2021), Nontoxic uninodular goiter, Obesity, Peripheral neuropathy, RLS (restless legs syndrome), Spinal stenosis, and Unspecified deficiency anemia (07/11/2013). here with:  OSA on CPAP  - CPAP compliance excellent - Good treatment of AHI  - Encourage patient to use CPAP nightly and > 4 hours each night   2.  Polyneuropathy  - Continue lidocaine cream as needed - Continue Lyrica  100 mg in the morning and 200 mg at bedtime  Follow-up in 6 to 7 months or sooner if needed   Ward Givens, MSN, NP-C 08/06/2022, 9:13 AM [facility] [address], [place], [place] [address] [phone]

## 2022-08-06 ENCOUNTER — Encounter: Payer: Self-pay | Admitting: Adult Health

## 2022-08-06 ENCOUNTER — Encounter: Payer: Self-pay | Admitting: Radiology

## 2022-08-06 ENCOUNTER — Ambulatory Visit: Payer: Medicare Other | Admitting: Neurology

## 2022-08-06 ENCOUNTER — Ambulatory Visit (INDEPENDENT_AMBULATORY_CARE_PROVIDER_SITE_OTHER): Payer: Medicare Other | Admitting: Adult Health

## 2022-08-06 VITALS — BP 125/73 | HR 58 | Ht 73.0 in | Wt 252.4 lb

## 2022-08-06 DIAGNOSIS — D472 Monoclonal gammopathy: Secondary | ICD-10-CM | POA: Diagnosis not present

## 2022-08-06 DIAGNOSIS — G4733 Obstructive sleep apnea (adult) (pediatric): Secondary | ICD-10-CM

## 2022-08-06 NOTE — Patient Instructions (Signed)
Your Plan:  Continue CPAP Continue Lyrica  Continue Lidocaine cream as needed If your symptoms worsen or you develop new symptoms please let us know.    Thank you for coming to see [place] at [facility]. I hope we have been able to provide you high quality care today.  You may receive a patient satisfaction survey over the next few weeks. We would appreciate your feedback and comments so that we may continue to improve ourselves and the health of our patients.

## 2022-08-09 DIAGNOSIS — G4733 Obstructive sleep apnea (adult) (pediatric): Secondary | ICD-10-CM | POA: Diagnosis not present

## 2022-08-11 ENCOUNTER — Other Ambulatory Visit: Payer: Self-pay | Admitting: Neurology

## 2022-08-14 NOTE — Telephone Encounter (Signed)
Pt called wanting to know when his medication will be called in. Please advise.

## 2022-08-15 ENCOUNTER — Other Ambulatory Visit: Payer: Self-pay | Admitting: Psychiatry

## 2022-08-15 MED ORDER — PREGABALIN 100 MG PO CAPS: ORAL_CAPSULE | ORAL | 5 refills | Status: DC

## 2022-08-19 DIAGNOSIS — R400 Somnolence: Secondary | ICD-10-CM | POA: Diagnosis not present

## 2022-08-19 DIAGNOSIS — H938X9 Other specified disorders of ear, unspecified ear: Secondary | ICD-10-CM | POA: Diagnosis not present

## 2022-08-19 NOTE — Telephone Encounter (Signed)
Medication refilled on 08/15/22 refusing this one

## 2022-09-09 DIAGNOSIS — G4733 Obstructive sleep apnea (adult) (pediatric): Secondary | ICD-10-CM | POA: Diagnosis not present

## 2022-09-15 DIAGNOSIS — G4733 Obstructive sleep apnea (adult) (pediatric): Secondary | ICD-10-CM | POA: Diagnosis not present

## 2022-09-16 ENCOUNTER — Inpatient Hospital Stay: Payer: Medicare Other | Attending: Hematology and Oncology

## 2022-09-16 DIAGNOSIS — D472 Monoclonal gammopathy: Secondary | ICD-10-CM | POA: Diagnosis not present

## 2022-09-16 DIAGNOSIS — E559 Vitamin D deficiency, unspecified: Secondary | ICD-10-CM | POA: Diagnosis not present

## 2022-09-16 LAB — CMP (CANCER CENTER ONLY)
ALT: 17 U/L (ref 0–44)
AST: 21 U/L (ref 15–41)
Albumin: 4.1 g/dL (ref 3.5–5.0)
Alkaline Phosphatase: 92 U/L (ref 38–126)
Anion gap: 7 (ref 5–15)
BUN: 18 mg/dL (ref 8–23)
CO2: 28 mmol/L (ref 22–32)
Calcium: 9.9 mg/dL (ref 8.9–10.3)
Chloride: 106 mmol/L (ref 98–111)
Creatinine: 0.88 mg/dL (ref 0.61–1.24)
GFR, Estimated: >60 mL/min (ref >60)
Glucose, Bld: 106 mg/dL — ABNORMAL HIGH (ref 70–99)
Potassium: 4.4 mmol/L (ref 3.5–5.1)
Sodium: 141 mmol/L (ref 135–145)
Total Bilirubin: 0.3 mg/dL (ref 0.3–1.2)
Total Protein: 7.2 g/dL (ref 6.5–8.1)

## 2022-09-16 LAB — CBC WITH DIFFERENTIAL (CANCER CENTER ONLY)
Abs Immature Granulocytes: 0.01 10*3/uL (ref 0.00–0.07)
Basophils Absolute: 0.1 10*3/uL (ref 0.0–0.1)
Basophils Relative: 1 %
Eosinophils Absolute: 0.3 10*3/uL (ref 0.0–0.5)
Eosinophils Relative: 4 %
HCT: 36.4 % — ABNORMAL LOW (ref 39.0–52.0)
Hemoglobin: 12.1 g/dL — ABNORMAL LOW (ref 13.0–17.0)
Immature Granulocytes: 0 %
Lymphocytes Relative: 35 %
Lymphs Abs: 2.2 10*3/uL (ref 0.7–4.0)
MCH: 30.5 pg (ref 26.0–34.0)
MCHC: 33.2 g/dL (ref 30.0–36.0)
MCV: 91.7 fL (ref 80.0–100.0)
Monocytes Absolute: 0.6 10*3/uL (ref 0.1–1.0)
Monocytes Relative: 9 %
Neutro Abs: 3.2 10*3/uL (ref 1.7–7.7)
Neutrophils Relative %: 51 %
Platelet Count: 220 10*3/uL (ref 150–400)
RBC: 3.97 MIL/uL — ABNORMAL LOW (ref 4.22–5.81)
RDW: 13.7 % (ref 11.5–15.5)
WBC Count: 6.3 10*3/uL (ref 4.0–10.5)
nRBC: 0 % (ref 0.0–0.2)

## 2022-09-16 LAB — VITAMIN D 25 HYDROXY (VIT D DEFICIENCY, FRACTURES): Vit D, 25-Hydroxy: 44.34 ng/mL (ref 30–100)

## 2022-09-17 LAB — KAPPA/LAMBDA LIGHT CHAINS
Kappa free light chain: 95.8 mg/L — ABNORMAL HIGH (ref 3.3–19.4)
Kappa, lambda light chain ratio: 13.12 — ABNORMAL HIGH (ref 0.26–1.65)
Lambda free light chains: 7.3 mg/L (ref 5.7–26.3)

## 2022-09-17 LAB — BETA 2 MICROGLOBULIN, SERUM: Beta-2 Microglobulin: 1.9 mg/L (ref 0.6–2.4)

## 2022-09-20 LAB — MULTIPLE MYELOMA PANEL, SERUM
Albumin SerPl Elph-Mcnc: 3.5 g/dL (ref 2.9–4.4)
Albumin/Glob SerPl: 1.2 (ref 0.7–1.7)
Alpha 1: 0.2 g/dL (ref 0.0–0.4)
Alpha2 Glob SerPl Elph-Mcnc: 0.7 g/dL (ref 0.4–1.0)
B-Globulin SerPl Elph-Mcnc: 0.8 g/dL (ref 0.7–1.3)
Gamma Glob SerPl Elph-Mcnc: 1.2 g/dL (ref 0.4–1.8)
Globulin, Total: 3 g/dL (ref 2.2–3.9)
IgA: 734 mg/dL — ABNORMAL HIGH (ref 61–437)
IgG (Immunoglobin G), Serum: 546 mg/dL — ABNORMAL LOW (ref 603–1613)
IgM (Immunoglobulin M), Srm: 38 mg/dL (ref 15–143)
M Protein SerPl Elph-Mcnc: 0.7 g/dL — ABNORMAL HIGH
Total Protein ELP: 6.5 g/dL (ref 6.0–8.5)

## 2022-09-25 ENCOUNTER — Inpatient Hospital Stay: Payer: Medicare Other | Admitting: Hematology and Oncology

## 2022-09-25 ENCOUNTER — Telehealth (HOSPITAL_BASED_OUTPATIENT_CLINIC_OR_DEPARTMENT_OTHER): Payer: Medicare Other | Admitting: Hematology and Oncology

## 2022-09-25 ENCOUNTER — Telehealth: Payer: Self-pay

## 2022-09-25 ENCOUNTER — Encounter: Payer: Self-pay | Admitting: Hematology and Oncology

## 2022-09-25 DIAGNOSIS — G609 Hereditary and idiopathic neuropathy, unspecified: Secondary | ICD-10-CM | POA: Diagnosis not present

## 2022-09-25 DIAGNOSIS — D472 Monoclonal gammopathy: Secondary | ICD-10-CM

## 2022-09-25 NOTE — Assessment & Plan Note (Signed)
He has chronic peripheral neuropathy that has been diagnosed for many years I suspect alcohol intake could be a contributing factor We discussed importance of alcohol cessation I do not believe this is caused by his MGUS

## 2022-09-25 NOTE — Progress Notes (Signed)
HEMATOLOGY-ONCOLOGY ELECTRONIC VISIT PROGRESS NOTE  Patient Care Team: Daniel Raider, MD as PCP - General (Family Medicine) [name], MD as PCP - Cardiology (Cardiology)  I connected with the patient via telephone conference and verified that I am speaking with the correct person using two identifiers. The patient's location is at home and I am providing care from the [facility] I discussed the limitations, risks, security and privacy concerns of performing an evaluation and management service by e-visits and the availability of in person appointments.  I also discussed with the patient that there may be a patient responsible charge related to this service. The patient expressed understanding and agreed to proceed.   ASSESSMENT & PLAN:  MGUS (monoclonal gammopathy of unknown significance) Clinically, he has no presence of disease progression. His M protein is only marginally elevated This is not the cause of his peripheral neuropathy  I will see him in 6 months  Hereditary and idiopathic peripheral neuropathy He has chronic peripheral neuropathy that has been diagnosed for many years I suspect alcohol intake could be a contributing factor We discussed importance of alcohol cessation I do not believe this is caused by his MGUS  Orders Placed This Encounter  Procedures   CMP (Cancer Center only)    Standing Status:   Future    Standing Expiration Date:   09/25/2023   CBC with Differential (Cancer Center Only)    Standing Status:   Future    Standing Expiration Date:   09/25/2023   Kappa/lambda light chains    Standing Status:   Standing    Number of Occurrences:   22    Standing Expiration Date:   09/25/2023   Multiple Myeloma Panel (SPEP&IFE w/QIG)    Standing Status:   Standing    Number of Occurrences:   22    Standing Expiration Date:   09/25/2023   Lactate dehydrogenase    Standing Status:   Future    Standing Expiration Date:   09/25/2023    INTERVAL  HISTORY: Please see below for problem oriented charting. The purpose of today's discussion is to review test results He missed his appointment today and it was switched to virtual visit His only complaint is peripheral neuropathy Denies new worsening bone pain No recurrent infections She is still consuming alcohol on a regular basis  SUMMARY OF ONCOLOGIC HISTORY:  This is a pleasant patient with severe peripheral neuropathy was referred here for evaluation of abnormal serum protein electrophoresis. Further studies revealed monoclonal gammopathy of unknown significance that is not the cause of his peripheral neuropathy, IgA kappa subtype. The peripheral neuropathy was thought to be related to alcoholism. He is being observed.  REVIEW OF SYSTEMS:   Constitutional: Denies fevers, chills or abnormal weight loss Eyes: Denies blurriness of vision Ears, nose, mouth, throat, and face: Denies mucositis or sore throat Respiratory: Denies cough, dyspnea or wheezes Cardiovascular: Denies palpitation, chest discomfort Gastrointestinal:  Denies nausea, heartburn or change in bowel habits Skin: Denies abnormal skin rashes Lymphatics: Denies new lymphadenopathy or easy bruising Neurological:Denies numbness, tingling or new weaknesses Behavioral/Psych: Mood is stable, no new changes  Extremities: No lower extremity edema All other systems were reviewed with the patient and are negative.  I have reviewed the past medical history, past surgical history, social history and family history with the patient and they are unchanged from previous note.  ALLERGIES:  is allergic to cymbalta [duloxetine hcl], lipitor [atorvastatin], and penicillins.  MEDICATIONS:  Current Outpatient Medications  Medication  Sig Dispense Refill   aspirin EC 81 MG tablet Take 1 tablet (81 mg total) by mouth daily. Swallow whole. 30 tablet 0   brimonidine (ALPHAGAN) 0.2 % ophthalmic solution Place 1 drop into both eyes in the  morning and at bedtime.     carvedilol (COREG) 3.125 MG tablet Take 1 tablet (3.125 mg total) by mouth 2 (two) times daily. 180 tablet 3   cetirizine (ZYRTEC) 10 MG tablet Take 10 mg by mouth every morning.     Cholecalciferol (VITAMIN D) 2000 UNITS CAPS Take 2,000 Units by mouth daily.     clopidogrel (PLAVIX) 75 MG tablet Take 1 tablet (75 mg total) by mouth daily. 90 tablet 3   Cyanocobalamin (VITAMIN B-12) 5000 MCG TBDP Take 10,000 mcg by mouth every morning.     dorzolamide-timolol (COSOPT) 22.3-6.8 MG/ML ophthalmic solution Place 1 drop into the left eye 2 (two) times daily.     ketoconazole (NIZORAL) 2 % cream Apply 1 application  topically daily as needed for irritation.     latanoprost (XALATAN) 0.005 % ophthalmic solution Place 1 drop into both eyes at bedtime.     lidocaine (XYLOCAINE) 5 % ointment Apply 1 application topically 2 (two) times daily as needed. (Patient taking differently: Apply 1 application  topically 2 (two) times daily as needed (neuropathy).) 35.44 g 3   MAGNESIUM PO Take 400 mg by mouth daily.     Multiple Vitamins-Minerals (CENTRUM) tablet Take 1 tablet by mouth daily. 50 +     nitroGLYCERIN (NITROSTAT) 0.4 MG SL tablet Place 1 tablet (0.4 mg total) under the tongue every 5 (five) minutes as needed. 25 tablet 2   Omega-3 Fatty Acids (FISH OIL) 1200 MG CAPS Take 3,600 mg by mouth daily. 3     pantoprazole (PROTONIX) 40 MG tablet Take 40 mg by mouth daily.      pregabalin (LYRICA) 100 MG capsule One in the am, one at noon and 2 tabs at night 120 capsule 5   rosuvastatin (CRESTOR) 40 MG tablet TAKE 1 TABLET BY MOUTH DAILY 90 tablet 1   sildenafil (REVATIO) 20 MG tablet Take 20-100 mg by mouth daily as needed (ED).     No current facility-administered medications for this visit.    PHYSICAL EXAMINATION: ECOG PERFORMANCE STATUS: 1 - Symptomatic but completely ambulatory  LABORATORY DATA:  I have reviewed the data as listed    Latest Ref Rng & Units 09/16/2022     8:05 AM 03/20/2022    8:03 AM 10/22/2021    8:30 AM  CMP  Glucose 70 - 99 mg/dL 161  096  045   BUN 8 - 23 mg/dL 18  13  18    Creatinine 0.61 - 1.24 mg/dL 4.09  8.11  9.14   Sodium 135 - 145 mmol/L 141  140  143   Potassium 3.5 - 5.1 mmol/L 4.4  4.1  5.0   Chloride 98 - 111 mmol/L 106  106  108   CO2 22 - 32 mmol/L 28  27  26    Calcium 8.9 - 10.3 mg/dL 9.9  9.6  9.5   Total Protein 6.5 - 8.1 g/dL 7.2  7.1    Total Bilirubin 0.3 - 1.2 mg/dL 0.3  0.5    Alkaline Phos 38 - 126 U/L 92  82    AST 15 - 41 U/L 21  17    ALT 0 - 44 U/L 17  13      Lab Results  Component Value  Date   WBC 6.3 09/16/2022   HGB 12.1 (L) 09/16/2022   HCT 36.4 (L) 09/16/2022   MCV 91.7 09/16/2022   PLT 220 09/16/2022   NEUTROABS 3.2 09/16/2022     I discussed the assessment and treatment plan with the patient. The patient was provided an opportunity to ask questions and all were answered. The patient agreed with the plan and demonstrated an understanding of the instructions. The patient was advised to call back or seek an in-person evaluation if the symptoms worsen or if the condition fails to improve as anticipated.    I spent 20 minutes for the appointment reviewing test results, discuss management and coordination of care.  [name], MD 09/25/2022 11:58 AM

## 2022-09-25 NOTE — Assessment & Plan Note (Signed)
Clinically, he has no presence of disease progression. His M protein is only marginally elevated This is not the cause of his peripheral neuropathy  I will see him in 6 months

## 2022-09-25 NOTE — Telephone Encounter (Signed)
Called regarding no showing for appts today at 8 am. He apologized. Office visit changed to phone call and told him Dr. Bertis Ruddy will call him in the next hour. He verbalized understanding.

## 2022-09-29 ENCOUNTER — Telehealth: Payer: Self-pay | Admitting: Adult Health

## 2022-09-29 MED ORDER — LIDOCAINE 5 % EX OINT: 1.0000 | TOPICAL_OINTMENT | Freq: Two times a day (BID) | CUTANEOUS | 1 refills | Status: DC | PRN

## 2022-09-29 NOTE — Telephone Encounter (Signed)
Refill sent to [facility] 

## 2022-09-29 NOTE — Telephone Encounter (Signed)
Pt is requesting a refill for  lidocaine (XYLOCAINE) 5 % ointment .  Pharmacy: [facility]

## 2022-10-05 ENCOUNTER — Ambulatory Visit: Payer: Medicare Other | Admitting: Adult Health

## 2022-10-06 DIAGNOSIS — R42 Dizziness and giddiness: Secondary | ICD-10-CM | POA: Diagnosis not present

## 2022-10-08 DIAGNOSIS — R42 Dizziness and giddiness: Secondary | ICD-10-CM | POA: Diagnosis not present

## 2022-10-09 DIAGNOSIS — G4733 Obstructive sleep apnea (adult) (pediatric): Secondary | ICD-10-CM | POA: Diagnosis not present

## 2022-10-19 ENCOUNTER — Other Ambulatory Visit: Payer: Self-pay | Admitting: Cardiology

## 2022-11-05 DIAGNOSIS — G4733 Obstructive sleep apnea (adult) (pediatric): Secondary | ICD-10-CM | POA: Diagnosis not present

## 2022-11-09 DIAGNOSIS — G4733 Obstructive sleep apnea (adult) (pediatric): Secondary | ICD-10-CM | POA: Diagnosis not present

## 2022-12-09 DIAGNOSIS — G4733 Obstructive sleep apnea (adult) (pediatric): Secondary | ICD-10-CM | POA: Diagnosis not present

## 2022-12-14 ENCOUNTER — Ambulatory Visit: Payer: Medicare Other | Admitting: Orthopaedic Surgery

## 2022-12-14 ENCOUNTER — Encounter: Payer: Self-pay | Admitting: Orthopaedic Surgery

## 2022-12-14 DIAGNOSIS — M25562 Pain in left knee: Secondary | ICD-10-CM

## 2022-12-14 DIAGNOSIS — G8929 Other chronic pain: Secondary | ICD-10-CM | POA: Diagnosis not present

## 2022-12-14 DIAGNOSIS — M1712 Unilateral primary osteoarthritis, left knee: Secondary | ICD-10-CM | POA: Diagnosis not present

## 2022-12-14 MED ORDER — LIDOCAINE HCL 1 % IJ SOLN
3.0000 mL | INTRAMUSCULAR | Status: AC | PRN
Start: 2022-12-14 — End: 2022-12-14
  Administered 2022-12-14: 3 mL

## 2022-12-14 MED ORDER — METHYLPREDNISOLONE ACETATE 40 MG/ML IJ SUSP
40.0000 mg | INTRAMUSCULAR | Status: AC | PRN
Start: 2022-12-14 — End: 2022-12-14
  Administered 2022-12-14: 40 mg via INTRA_ARTICULAR

## 2022-12-14 NOTE — Progress Notes (Signed)
The patient is here for follow-up as a relates to his left knee.  He is an active 77 year old gentleman and states that he now cannot really play golf due to the pain in his left knee.  He has known arthritis of that knee.  The first steroid injection that we placed in his knee after aspirating the knee were greatly and lasted for over 3 months.  The last 1 did not last him as long and he is thinking about the potential for knee replacement surgery.  He is on Plavix.  He is not a diabetic but he does have neuropathy.  Examination of his left knee today shows no effusion.  His range of motion is full but there is varus malalignment and is painful throughout the arc of motion.  I did place a steroid injection per his request in his left knee today.  We will see him back in just a month and consider talking about knee replacement surgery at that point if the injection does not help or last long.  All question concerns were addressed and answered.    Procedure Note  Patient: Daniel Whitaker             Date of Birth: 22-Aug-1945           MRN: [id number]             Visit Date: 12/14/2022  Procedures: Visit Diagnoses:  1. Unilateral primary osteoarthritis, left knee   2. Chronic pain of left knee     Large Joint Inj: L knee on 12/14/2022 10:04 AM Indications: diagnostic evaluation and pain Details: 22 G 1.5 in needle, superolateral approach  Arthrogram: No  Medications: 3 mL lidocaine 1 %; 40 mg methylPREDNISolone acetate 40 MG/ML Outcome: tolerated well, no immediate complications Procedure, treatment alternatives, risks and benefits explained, specific risks discussed. Consent was given by the patient. Immediately prior to procedure a time out was called to verify the correct patient, procedure, equipment, support staff and site/side marked as required. Patient was prepped and draped in the usual sterile fashion.

## 2022-12-16 DIAGNOSIS — H04123 Dry eye syndrome of bilateral lacrimal glands: Secondary | ICD-10-CM | POA: Diagnosis not present

## 2022-12-16 DIAGNOSIS — H1045 Other chronic allergic conjunctivitis: Secondary | ICD-10-CM | POA: Diagnosis not present

## 2022-12-16 DIAGNOSIS — H26493 Other secondary cataract, bilateral: Secondary | ICD-10-CM | POA: Diagnosis not present

## 2022-12-16 DIAGNOSIS — H401123 Primary open-angle glaucoma, left eye, severe stage: Secondary | ICD-10-CM | POA: Diagnosis not present

## 2022-12-16 DIAGNOSIS — Z961 Presence of intraocular lens: Secondary | ICD-10-CM | POA: Diagnosis not present

## 2023-01-09 DIAGNOSIS — G4733 Obstructive sleep apnea (adult) (pediatric): Secondary | ICD-10-CM | POA: Diagnosis not present

## 2023-01-13 ENCOUNTER — Ambulatory Visit: Payer: Medicare Other | Admitting: Orthopaedic Surgery

## 2023-01-20 ENCOUNTER — Encounter: Payer: Self-pay | Admitting: Orthopaedic Surgery

## 2023-01-20 ENCOUNTER — Ambulatory Visit: Payer: Medicare Other | Admitting: Orthopaedic Surgery

## 2023-01-20 DIAGNOSIS — M25562 Pain in left knee: Secondary | ICD-10-CM | POA: Diagnosis not present

## 2023-01-20 DIAGNOSIS — M1712 Unilateral primary osteoarthritis, left knee: Secondary | ICD-10-CM

## 2023-01-20 DIAGNOSIS — G8929 Other chronic pain: Secondary | ICD-10-CM | POA: Diagnosis not present

## 2023-01-20 NOTE — Progress Notes (Signed)
The patient is a 77 year old gentleman well-known to me.  He is active and has been dealing with left knee pain for some time now.  We actually did obtain an MRI of his left knee in January of this year and it shows there is a full-thickness cartilage loss throughout the weightbearing surface of the medial and lateral femoral condyles.  There was no meniscal tear.  He does have daily knee pain and a month ago we did place a steroid injection in his left knee.  He says that lasted for only about 2 weeks.  He says the pain is becoming sharp and at this point it is detrimentally affecting his mobility, his quality of life and his actives daily living.  He is on Plavix.  He still works and tries to stay active.  Examination of his left knee today shows varus malalignment that is easily correctable.  He has posterior medial and lateral tenderness of the knee.  The knee hurts of the arc of motion of the knee.  There is a mild effusion.  Previous x-rays and MRI show tricompartment arthritis of the left knee.  At this point given the failure of conservative treatment we are recommending knee replacement surgery.  I showed him a knee replacement model and went over the surgery in detail in terms of discussing the risks and benefits of surgery and what to expect from an intraoperative and postoperative standpoint.  He would need to stop Plavix 1 week prior to surgery.  All questions and concerns were answered and addressed.  He does have our surgery scheduler's card and is going to talk to his work and decide if he would like to proceed with surgery and give [place] a call.

## 2023-02-03 ENCOUNTER — Telehealth: Payer: Self-pay | Admitting: Orthopaedic Surgery

## 2023-02-03 NOTE — Telephone Encounter (Signed)
Patient called advised he wants to schedule his surgery. Patient said he had an injection on 12/14/2022. Patient asked if he can come in and get another injection in his knee 09/17 or 09/18 before he leaves on his golf trip.   The number to contact patient is [phone]

## 2023-02-04 ENCOUNTER — Telehealth: Payer: Self-pay | Admitting: Orthopaedic Surgery

## 2023-02-04 NOTE — Telephone Encounter (Signed)
Called patient left message to return call to schedule an appointment with Dr. Magnus Ivan or Bronson Curb for knee injection on 02/16/2023 or 02/17/2023

## 2023-02-16 ENCOUNTER — Other Ambulatory Visit: Payer: Self-pay | Admitting: Psychiatry

## 2023-02-17 ENCOUNTER — Ambulatory Visit (INDEPENDENT_AMBULATORY_CARE_PROVIDER_SITE_OTHER): Payer: Medicare Other | Admitting: Orthopaedic Surgery

## 2023-02-17 DIAGNOSIS — M25562 Pain in left knee: Secondary | ICD-10-CM | POA: Diagnosis not present

## 2023-02-17 DIAGNOSIS — G8929 Other chronic pain: Secondary | ICD-10-CM

## 2023-02-17 DIAGNOSIS — M1712 Unilateral primary osteoarthritis, left knee: Secondary | ICD-10-CM

## 2023-02-17 MED ORDER — METHYLPREDNISOLONE ACETATE 40 MG/ML IJ SUSP
40.0000 mg | INTRAMUSCULAR | Status: AC | PRN
Start: 2023-02-17 — End: 2023-02-17
  Administered 2023-02-17: 40 mg via INTRA_ARTICULAR

## 2023-02-17 MED ORDER — LIDOCAINE HCL 1 % IJ SOLN
3.0000 mL | INTRAMUSCULAR | Status: AC | PRN
Start: 2023-02-17 — End: 2023-02-17
  Administered 2023-02-17: 3 mL

## 2023-02-17 NOTE — Telephone Encounter (Signed)
Last seen on 08/06/22 Follow up scheduled on 03/11/23 Last filled on 01/18/23 #120 tablets (30 day supply) Rx pending to be signed

## 2023-02-17 NOTE — Progress Notes (Signed)
The patient comes in today requesting a steroid injection for his left knee to treat the known arthritis pain that he has.  He is actually going on a golf trip coming up and I have recommended this for him as well.  We have seen him for this knee before.  We have recommended knee replacement surgery and that is going to be scheduled for later this year.  We have drained effusions from his knee as well.  Examination today shows varus malalignment of the left knee.  There is no effusion but good range of motion.  I did place a steroid injection per his request in his left knee today and he tolerated it well.  Again we will work on getting him scheduled for a knee replacement later this year.    Procedure Note  Patient: Daniel Whitaker             Date of Birth: Dec 03, 1945           MRN: [id number]             Visit Date: 02/17/2023  Procedures: Visit Diagnoses:  1. Unilateral primary osteoarthritis, left knee   2. Chronic pain of left knee     Large Joint Inj: L knee on 02/17/2023 8:57 AM Indications: diagnostic evaluation and pain Details: 22 G 1.5 in needle, superolateral approach  Arthrogram: No  Medications: 3 mL lidocaine 1 %; 40 mg methylPREDNISolone acetate 40 MG/ML Outcome: tolerated well, no immediate complications Procedure, treatment alternatives, risks and benefits explained, specific risks discussed. Consent was given by the patient. Immediately prior to procedure a time out was called to verify the correct patient, procedure, equipment, support staff and site/side marked as required. Patient was prepped and draped in the usual sterile fashion.

## 2023-02-23 DIAGNOSIS — M48061 Spinal stenosis, lumbar region without neurogenic claudication: Secondary | ICD-10-CM | POA: Diagnosis not present

## 2023-02-24 ENCOUNTER — Telehealth: Payer: Self-pay | Admitting: *Deleted

## 2023-02-24 ENCOUNTER — Telehealth (HOSPITAL_BASED_OUTPATIENT_CLINIC_OR_DEPARTMENT_OTHER): Payer: Self-pay

## 2023-02-24 ENCOUNTER — Telehealth: Payer: Self-pay | Admitting: Cardiology

## 2023-02-24 NOTE — Telephone Encounter (Signed)
Pt has been scheduled for tele pre op appt 03/10/23 @ 1:40. Med rec and consent are done.

## 2023-02-24 NOTE — Telephone Encounter (Signed)
Pre-operative Risk Assessment    Patient Name: Daniel Whitaker  DOB: 1946/01/05 MRN: [id number]   Last OV was 05/05/22 with Dr. Antoine Poche Next OV is 05/10/23 with Dr. Antoine Poche   Request for Surgical Clearance    Procedure:   ESI  Date of Surgery:  Clearance TBD                                 Surgeon:  Dr. Britt Bolognese Group or Practice Name:  [facility] Phone number:  [phone] Fax number:  [phone]   Type of Clearance Requested:   - Medical  - Pharmacy:  Hold Aspirin and Clopidogrel (Plavix) requesting office is asking for 7 day hold prior on plavix and resume the day after   Type of Anesthesia:  Not Indicated   Additional requests/questions:    Signed, Zada Finders   02/24/2023, 9:30 AM

## 2023-02-24 NOTE — Telephone Encounter (Signed)
Name: Daniel Whitaker  DOB: [birth year]  MRN: [id number]  Primary Cardiologist: [name], MD   Preoperative team, please contact this patient and set up a phone call appointment for further preoperative risk assessment. Please obtain consent and complete medication review. Thank you for your help.  I confirm that guidance regarding antiplatelet and oral anticoagulation therapy has been completed and, if necessary, noted below.  Please verify that patient stopped taking Plavix is May. His ASA may be held for 7 days prior to his ESI.    Ronney Asters, NP 02/24/2023, 9:54 AM [facility]

## 2023-02-24 NOTE — Telephone Encounter (Signed)
Pt has been scheduled for tele pre op appt 03/10/23 @ 1:40. Med rec and consent are done.     Patient Consent for Virtual Visit        YOSIEL ROTTINGHAUS has provided verbal consent on 02/24/2023 for a virtual visit (video or telephone).   CONSENT FOR VIRTUAL VISIT FOR:  Liberty Handy  By participating in this virtual visit I agree to the following:  I hereby voluntarily request, consent and authorize [facility] and its employed or contracted physicians, physician assistants, nurse practitioners or other licensed health care professionals (the Practitioner), to provide me with telemedicine health care services (the "Services") as deemed necessary by the treating Practitioner. I acknowledge and consent to receive the Services by the Practitioner via telemedicine. I understand that the telemedicine visit will involve communicating with the Practitioner through live audiovisual communication technology and the disclosure of certain medical information by electronic transmission. I acknowledge that I have been given the opportunity to request an in-person assessment or other available alternative prior to the telemedicine visit and am voluntarily participating in the telemedicine visit.  I understand that I have the right to withhold or withdraw my consent to the use of telemedicine in the course of my care at any time, without affecting my right to future care or treatment, and that the Practitioner or I may terminate the telemedicine visit at any time. I understand that I have the right to inspect all information obtained and/or recorded in the course of the telemedicine visit and may receive copies of available information for a reasonable fee.  I understand that some of the potential risks of receiving the Services via telemedicine include:  Delay or interruption in medical evaluation due to technological equipment failure or disruption; Information transmitted may not be sufficient (e.g.  poor resolution of images) to allow for appropriate medical decision making by the Practitioner; and/or  In rare instances, security protocols could fail, causing a breach of personal health information.  Furthermore, I acknowledge that it is my responsibility to provide information about my medical history, conditions and care that is complete and accurate to the best of my ability. I acknowledge that Practitioner's advice, recommendations, and/or decision may be based on factors not within their control, such as incomplete or inaccurate data provided by me or distortions of diagnostic images or specimens that may result from electronic transmissions. I understand that the practice of medicine is not an exact science and that Practitioner makes no warranties or guarantees regarding treatment outcomes. I acknowledge that a copy of this consent can be made available to me via my patient portal [facility] MyChart), or I can request a printed copy by calling the office of [facility].    I understand that my insurance will be billed for this visit.   I have read or had this consent read to me. I understand the contents of this consent, which adequately explains the benefits and risks of the Services being provided via telemedicine.  I have been provided ample opportunity to ask questions regarding this consent and the Services and have had my questions answered to my satisfaction. I give my informed consent for the services to be provided through the use of telemedicine in my medical care

## 2023-02-24 NOTE — Telephone Encounter (Signed)
1st attempt at scheduling tele preop appt. [facility].

## 2023-02-24 NOTE — Telephone Encounter (Signed)
Patient is returning CMA's call to setup preop tele-visit.  Please advise.

## 2023-03-05 ENCOUNTER — Telehealth: Payer: Self-pay

## 2023-03-05 NOTE — Telephone Encounter (Signed)
I called patient and left voice mail for return call to discuss scheduling surgery.

## 2023-03-09 ENCOUNTER — Telehealth: Payer: Self-pay | Admitting: Adult Health

## 2023-03-09 NOTE — Telephone Encounter (Signed)
..   Pt understands that although there may be some limitations with this type of visit, we will take all precautions to reduce any security or privacy concerns.  Pt understands that this will be treated like an in office visit and we will file with pt's insurance, and there may be a patient responsible charge related to this service. ? ?

## 2023-03-10 ENCOUNTER — Encounter: Payer: Self-pay | Admitting: *Deleted

## 2023-03-10 ENCOUNTER — Ambulatory Visit: Payer: Medicare Other | Attending: Cardiovascular Disease | Admitting: Nurse Practitioner

## 2023-03-10 DIAGNOSIS — Z01810 Encounter for preprocedural cardiovascular examination: Secondary | ICD-10-CM | POA: Diagnosis not present

## 2023-03-10 NOTE — Progress Notes (Signed)
Virtual Visit via Telephone Note   Because of Daniel Whitaker's co-morbid illnesses, he is at least at moderate risk for complications without adequate follow up.  This format is felt to be most appropriate for this patient at this time.  The patient did not have access to video technology/had technical difficulties with video requiring transitioning to audio format only (telephone).  All issues noted in this document were discussed and addressed.  No physical exam could be performed with this format.  Please refer to the patient's chart for his consent to telehealth for [facility].  Evaluation Performed:  Preoperative cardiovascular risk assessment _____________   Date:  03/10/2023   Patient ID:  Daniel Whitaker, DOB May 26, 1946, MRN [id number] Patient Location:  Home Provider location:   Office  Primary Care Provider:  [name], MD Primary Cardiologist:  [name], MD  Chief Complaint / Patient Profile   77 y.o. y/o male with a h/o CAD s/p DES-LAD in 09/2021, hyperlipidemia, and obesity who is pending ESI with Dr. Aileen Fass of [facility] and presents today for telephonic preoperative cardiovascular risk assessment.  History of Present Illness    TALLYN Whitaker is a 77 y.o. male who presents via audio/video conferencing for a telehealth visit today.  Pt was last seen in cardiology clinic on 05/05/2022 by Dr. Antoine Poche. At that time JAQUALON WATTON was doing well. The patient is now pending procedure as outlined above. Since his last visit, he has been stable from a cardiac standpoint.   He denies chest pain, palpitations, dyspnea, pnd, orthopnea, n, v, dizziness, syncope, edema, weight gain, or early satiety. All other systems reviewed and are otherwise negative except as noted above.   Past Medical History    Past Medical History:  Diagnosis Date   Arthritis    Cataract    removed bilaterally    Coronary artery disease    Erectile  dysfunction    Esophageal reflux    Glaucoma    Hypertriglyceridemia    Impaired fasting glucose    MGUS (monoclonal gammopathy of unknown significance)    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   Nocturnal leg cramps 03/11/2021   Nontoxic uninodular goiter    Obesity    Peripheral neuropathy    RLS (restless legs syndrome)    Spinal stenosis    Unspecified deficiency anemia 07/11/2013   Past Surgical History:  Procedure Laterality Date   CARDIAC CATHETERIZATION     COLON RESECTION N/A 04/21/2013   Procedure: DIAGNOSTIC LAPAROSCOPY, lysis of adhesions for partial small bowel obstruction;  Surgeon: Adolph Pollack, MD;  Location: [facility] ORS;  Service: General;  Laterality: N/A;   COLON SURGERY     partial SBO    COLONOSCOPY     CORONARY STENT INTERVENTION N/A 10/22/2021   Procedure: CORONARY STENT INTERVENTION;  Surgeon: Marykay Lex, MD;  Location: [facility] INVASIVE CV LAB;  Service: Cardiovascular;  Laterality: N/A;   KNEE SURGERY     Arthroscopic   LEFT HEART CATH AND CORONARY ANGIOGRAPHY N/A 10/22/2021   Procedure: LEFT HEART CATH AND CORONARY ANGIOGRAPHY;  Surgeon: Marykay Lex, MD;  Location: [facility] INVASIVE CV LAB;  Service: Cardiovascular;  Laterality: N/A;   SMALL INTESTINE SURGERY     Blockage    Allergies  Allergies  Allergen Reactions   Cymbalta [Duloxetine Hcl]     Drowsiness, nausea   Lipitor [Atorvastatin] Other (See Comments)    Pt unsure of reaction  Penicillins Hives    Home Medications    Prior to Admission medications   Medication Sig Start Date End Date Taking? Authorizing Provider  aspirin EC 81 MG tablet Take 1 tablet (81 mg total) by mouth daily. Swallow whole. 10/22/21   Marykay Lex, MD  brimonidine (ALPHAGAN) 0.2 % ophthalmic solution Place 1 drop into both eyes in the morning and at bedtime. 01/08/20   [provider]  carvedilol (COREG) 3.125 MG tablet Take 1 tablet (3.125 mg total) by mouth 2 (two) times daily. 05/21/22    [name], MD  cetirizine (ZYRTEC) 10 MG tablet Take 10 mg by mouth every morning.    [provider]  Cholecalciferol (VITAMIN D) 2000 UNITS CAPS Take 2,000 Units by mouth daily.    [provider]  clopidogrel (PLAVIX) 75 MG tablet Take 1 tablet (75 mg total) by mouth daily. 05/20/22   [name], MD  Cyanocobalamin (VITAMIN B-12) 5000 MCG TBDP Take 10,000 mcg by mouth every morning.    [provider]  dorzolamide-timolol (COSOPT) 22.3-6.8 MG/ML ophthalmic solution Place 1 drop into the left eye 2 (two) times daily. 03/09/18   [provider]  ketoconazole (NIZORAL) 2 % cream Apply 1 application  topically daily as needed for irritation. 11/28/14   [provider]  latanoprost (XALATAN) 0.005 % ophthalmic solution Place 1 drop into both eyes at bedtime. 03/01/13   [provider]  lidocaine (XYLOCAINE) 5 % ointment Apply 1 Application topically 2 (two) times daily as needed. 09/29/22   Butch Penny, NP  MAGNESIUM PO Take 400 mg by mouth daily.    [provider]  Multiple Vitamins-Minerals (CENTRUM) tablet Take 1 tablet by mouth daily. 50 +    [provider]  nitroGLYCERIN (NITROSTAT) 0.4 MG SL tablet Place 1 tablet (0.4 mg total) under the tongue every 5 (five) minutes as needed. Patient not taking: Reported on 02/24/2023 10/22/21   Arty Baumgartner, NP  Omega-3 Fatty Acids (FISH OIL) 1200 MG CAPS Take 3,600 mg by mouth daily. 3    [provider]  pantoprazole (PROTONIX) 40 MG tablet Take 40 mg by mouth daily.  03/14/13   [provider]  pregabalin (LYRICA) 100 MG capsule TAKE 1 CAPSULE BY MOUTH IN THE MORNING, AND AT NOON, AND 2 CAPSULES AT NIGHT 02/17/23   Ocie Doyne, MD  rosuvastatin (CRESTOR) 40 MG tablet TAKE 1 TABLET BY MOUTH DAILY 10/19/22   [name], MD  sildenafil (REVATIO) 20 MG tablet Take 20-100 mg by mouth daily as needed (ED).    [provider]    Physical  Exam    Vital Signs:  JOHNOTHAN WIVELL does not have vital signs available for review today.  Given telephonic nature of communication, physical exam is limited. AAOx3. NAD. Normal affect.  Speech and respirations are unlabored.  Accessory Clinical Findings    None  Assessment & Plan    1.  Preoperative Cardiovascular Risk Assessment:  According to the Revised Cardiac Risk Index (RCRI), his Perioperative Risk of Major Cardiac Event is (%): 0.9. His Functional Capacity in METs is: 6.36 according to the Duke Activity Status Index (DASI). Therefore, based on ACC/AHA guidelines, patient would be at acceptable risk for the planned procedure without further cardiovascular testing.  The patient was advised that if he develops new symptoms prior to surgery to contact our office to arrange for a follow-up visit, and he verbalized understanding.   Per office protocol, he may hold Plavix for  5 days prior to procedure. Please resume Plavix as soon as possible postprocedure, at the discretion of the surgeon.   Regarding ASA therapy, we recommend continuation of ASA throughout the perioperative period.  However, if the surgeon feels that cessation of ASA is required in the perioperative period, it may be stopped 5-7 days prior to surgery with a plan to resume it as soon as felt to be feasible from a surgical standpoint in the post-operative period.   A copy of this note will be routed to requesting surgeon.  Time:   Today, I have spent 5 minutes with the patient with telehealth technology discussing medical history, symptoms, and management plan.     Joylene Grapes, NP  03/10/2023, 1:49 PM

## 2023-03-11 ENCOUNTER — Telehealth: Payer: Medicare Other | Admitting: Adult Health

## 2023-03-11 ENCOUNTER — Telehealth: Payer: Self-pay

## 2023-03-11 DIAGNOSIS — G4733 Obstructive sleep apnea (adult) (pediatric): Secondary | ICD-10-CM | POA: Diagnosis not present

## 2023-03-11 DIAGNOSIS — D472 Monoclonal gammopathy: Secondary | ICD-10-CM | POA: Diagnosis not present

## 2023-03-11 DIAGNOSIS — G629 Polyneuropathy, unspecified: Secondary | ICD-10-CM | POA: Diagnosis not present

## 2023-03-11 NOTE — Telephone Encounter (Signed)
I returned patient's call to discuss scheduling surgery.  Left voice mail message for return call.

## 2023-03-12 NOTE — Telephone Encounter (Signed)
REQUESTING OFFICE SENT ANOTHER REQUEST, ASKING TO HOLD PLAVIX x 7 DAYS PRIOR AND RESUME THE NEXT DAY. I  I WILL FORWARD BACK TO PRE OP APP.

## 2023-03-15 NOTE — Telephone Encounter (Signed)
Per office protocol, he may hold Plavix for 7 days prior to procedure and should resume as soon as hemodynamically stable postoperatively.  Ideally aspirin should be continued without interruption, however if the bleeding risk is too great, aspirin may be held for 5-7 days prior to surgery. Please resume aspirin post operatively when it is felt to be safe from a bleeding standpoint.    Levi Aland, NP-C  03/15/2023, 7:06 AM [address] Office [phone] Fax [phone]

## 2023-03-19 ENCOUNTER — Inpatient Hospital Stay: Payer: Medicare Other

## 2023-03-30 ENCOUNTER — Ambulatory Visit: Payer: Medicare Other | Admitting: Hematology and Oncology

## 2023-03-30 DIAGNOSIS — M5136 Other intervertebral disc degeneration, lumbar region with discogenic back pain only: Secondary | ICD-10-CM | POA: Diagnosis not present

## 2023-03-30 DIAGNOSIS — M9904 Segmental and somatic dysfunction of sacral region: Secondary | ICD-10-CM | POA: Diagnosis not present

## 2023-03-30 DIAGNOSIS — M9903 Segmental and somatic dysfunction of lumbar region: Secondary | ICD-10-CM | POA: Diagnosis not present

## 2023-03-30 DIAGNOSIS — M9905 Segmental and somatic dysfunction of pelvic region: Secondary | ICD-10-CM | POA: Diagnosis not present

## 2023-03-31 DIAGNOSIS — M9903 Segmental and somatic dysfunction of lumbar region: Secondary | ICD-10-CM | POA: Diagnosis not present

## 2023-03-31 DIAGNOSIS — M9905 Segmental and somatic dysfunction of pelvic region: Secondary | ICD-10-CM | POA: Diagnosis not present

## 2023-03-31 DIAGNOSIS — M5136 Other intervertebral disc degeneration, lumbar region with discogenic back pain only: Secondary | ICD-10-CM | POA: Diagnosis not present

## 2023-03-31 DIAGNOSIS — M9904 Segmental and somatic dysfunction of sacral region: Secondary | ICD-10-CM | POA: Diagnosis not present

## 2023-04-01 DIAGNOSIS — M5136 Other intervertebral disc degeneration, lumbar region with discogenic back pain only: Secondary | ICD-10-CM | POA: Diagnosis not present

## 2023-04-01 DIAGNOSIS — M9903 Segmental and somatic dysfunction of lumbar region: Secondary | ICD-10-CM | POA: Diagnosis not present

## 2023-04-01 DIAGNOSIS — M9905 Segmental and somatic dysfunction of pelvic region: Secondary | ICD-10-CM | POA: Diagnosis not present

## 2023-04-01 DIAGNOSIS — R0789 Other chest pain: Secondary | ICD-10-CM | POA: Diagnosis not present

## 2023-04-01 DIAGNOSIS — T148XXA Other injury of unspecified body region, initial encounter: Secondary | ICD-10-CM | POA: Diagnosis not present

## 2023-04-01 DIAGNOSIS — M9904 Segmental and somatic dysfunction of sacral region: Secondary | ICD-10-CM | POA: Diagnosis not present

## 2023-04-05 DIAGNOSIS — S39012D Strain of muscle, fascia and tendon of lower back, subsequent encounter: Secondary | ICD-10-CM | POA: Diagnosis not present

## 2023-04-05 DIAGNOSIS — R8279 Other abnormal findings on microbiological examination of urine: Secondary | ICD-10-CM | POA: Diagnosis not present

## 2023-04-06 ENCOUNTER — Inpatient Hospital Stay: Payer: Medicare Other | Attending: Hematology and Oncology

## 2023-04-06 DIAGNOSIS — D472 Monoclonal gammopathy: Secondary | ICD-10-CM | POA: Insufficient documentation

## 2023-04-06 DIAGNOSIS — E559 Vitamin D deficiency, unspecified: Secondary | ICD-10-CM | POA: Insufficient documentation

## 2023-04-06 DIAGNOSIS — D539 Nutritional anemia, unspecified: Secondary | ICD-10-CM | POA: Insufficient documentation

## 2023-04-08 ENCOUNTER — Telehealth: Payer: Self-pay

## 2023-04-08 NOTE — Telephone Encounter (Signed)
-----   Message from [name] sent at 04/08/2023  1:45 PM EST ----- He has phone visit next week He is no show to labs Labs have to be done 10 days prior to appt Please see if you can reach out to him to reschedule his appt

## 2023-04-08 NOTE — Telephone Encounter (Signed)
Attempted to call patient in regards to upcoming appointments.  LVM with call back number and request for return call to our office in order to get the patient rescheduled.

## 2023-04-08 NOTE — Telephone Encounter (Signed)
Patient's appointments rescheduled to accommodate necessary time for lab results.  Confirmed updated appointment dates and times with the patient. Patient confirmed appointments with RN. Patient knows to call should he have any conflicts and need to reschedule.

## 2023-04-12 ENCOUNTER — Inpatient Hospital Stay (HOSPITAL_BASED_OUTPATIENT_CLINIC_OR_DEPARTMENT_OTHER): Payer: Medicare Other

## 2023-04-12 DIAGNOSIS — D472 Monoclonal gammopathy: Secondary | ICD-10-CM

## 2023-04-12 DIAGNOSIS — D539 Nutritional anemia, unspecified: Secondary | ICD-10-CM | POA: Diagnosis not present

## 2023-04-12 DIAGNOSIS — E559 Vitamin D deficiency, unspecified: Secondary | ICD-10-CM | POA: Diagnosis not present

## 2023-04-12 LAB — CBC WITH DIFFERENTIAL (CANCER CENTER ONLY)
Abs Immature Granulocytes: 0.03 10*3/uL (ref 0.00–0.07)
Basophils Absolute: 0.1 10*3/uL (ref 0.0–0.1)
Basophils Relative: 1 %
Eosinophils Absolute: 0.3 10*3/uL (ref 0.0–0.5)
Eosinophils Relative: 4 %
HCT: 31.8 % — ABNORMAL LOW (ref 39.0–52.0)
Hemoglobin: 10.2 g/dL — ABNORMAL LOW (ref 13.0–17.0)
Immature Granulocytes: 0 %
Lymphocytes Relative: 26 %
Lymphs Abs: 1.9 10*3/uL (ref 0.7–4.0)
MCH: 28.7 pg (ref 26.0–34.0)
MCHC: 32.1 g/dL (ref 30.0–36.0)
MCV: 89.6 fL (ref 80.0–100.0)
Monocytes Absolute: 0.7 10*3/uL (ref 0.1–1.0)
Monocytes Relative: 9 %
Neutro Abs: 4.4 10*3/uL (ref 1.7–7.7)
Neutrophils Relative %: 60 %
Platelet Count: 306 10*3/uL (ref 150–400)
RBC: 3.55 MIL/uL — ABNORMAL LOW (ref 4.22–5.81)
RDW: 15 % (ref 11.5–15.5)
WBC Count: 7.3 10*3/uL (ref 4.0–10.5)
nRBC: 0 % (ref 0.0–0.2)

## 2023-04-12 LAB — CMP (CANCER CENTER ONLY)
ALT: 11 U/L (ref 0–44)
AST: 14 U/L — ABNORMAL LOW (ref 15–41)
Albumin: 3.6 g/dL (ref 3.5–5.0)
Alkaline Phosphatase: 87 U/L (ref 38–126)
Anion gap: 8 (ref 5–15)
BUN: 17 mg/dL (ref 8–23)
CO2: 26 mmol/L (ref 22–32)
Calcium: 9.8 mg/dL (ref 8.9–10.3)
Chloride: 105 mmol/L (ref 98–111)
Creatinine: 0.99 mg/dL (ref 0.61–1.24)
GFR, Estimated: >60 mL/min (ref >60)
Glucose, Bld: 91 mg/dL (ref 70–99)
Potassium: 3.9 mmol/L (ref 3.5–5.1)
Sodium: 139 mmol/L (ref 135–145)
Total Bilirubin: 0.4 mg/dL (ref <1.2)
Total Protein: 8.2 g/dL — ABNORMAL HIGH (ref 6.5–8.1)

## 2023-04-12 LAB — LACTATE DEHYDROGENASE: LDH: 128 U/L (ref 98–192)

## 2023-04-13 ENCOUNTER — Telehealth: Payer: Medicare Other | Admitting: Hematology and Oncology

## 2023-04-13 LAB — KAPPA/LAMBDA LIGHT CHAINS
Kappa free light chain: 381.8 mg/L — ABNORMAL HIGH (ref 3.3–19.4)
Kappa, lambda light chain ratio: 70.7 — ABNORMAL HIGH (ref 0.26–1.65)
Lambda free light chains: 5.4 mg/L — ABNORMAL LOW (ref 5.7–26.3)

## 2023-04-14 LAB — MULTIPLE MYELOMA PANEL, SERUM
Albumin SerPl Elph-Mcnc: 3.4 g/dL (ref 2.9–4.4)
Albumin/Glob SerPl: 0.8 (ref 0.7–1.7)
Alpha 1: 0.2 g/dL (ref 0.0–0.4)
Alpha2 Glob SerPl Elph-Mcnc: 0.8 g/dL (ref 0.4–1.0)
B-Globulin SerPl Elph-Mcnc: 0.8 g/dL (ref 0.7–1.3)
Gamma Glob SerPl Elph-Mcnc: 2.7 g/dL — ABNORMAL HIGH (ref 0.4–1.8)
Globulin, Total: 4.6 g/dL — ABNORMAL HIGH (ref 2.2–3.9)
IgA: 2065 mg/dL — ABNORMAL HIGH (ref 61–437)
IgG (Immunoglobin G), Serum: 492 mg/dL — ABNORMAL LOW (ref 603–1613)
IgM (Immunoglobulin M), Srm: 31 mg/dL (ref 15–143)
M Protein SerPl Elph-Mcnc: 2.2 g/dL — ABNORMAL HIGH
Total Protein ELP: 8 g/dL (ref 6.0–8.5)

## 2023-04-15 ENCOUNTER — Encounter: Payer: Self-pay | Admitting: Hematology and Oncology

## 2023-04-15 ENCOUNTER — Inpatient Hospital Stay: Payer: Medicare Other | Admitting: Hematology and Oncology

## 2023-04-15 VITALS — BP 143/69 | HR 77 | Temp 98.1°F | Resp 18 | Ht 73.0 in | Wt 245.2 lb

## 2023-04-15 DIAGNOSIS — D472 Monoclonal gammopathy: Secondary | ICD-10-CM | POA: Diagnosis not present

## 2023-04-15 DIAGNOSIS — E559 Vitamin D deficiency, unspecified: Secondary | ICD-10-CM | POA: Diagnosis not present

## 2023-04-15 DIAGNOSIS — C9000 Multiple myeloma not having achieved remission: Secondary | ICD-10-CM

## 2023-04-15 DIAGNOSIS — D539 Nutritional anemia, unspecified: Secondary | ICD-10-CM | POA: Diagnosis not present

## 2023-04-15 NOTE — Assessment & Plan Note (Signed)
I expressed major concern over recent results of his myeloma panel The patient is currently at smoldering myeloma stage but with rapid increase of his M protein and light chain levels, as well as symptomatic bone pain, I am concerned he might have progressed to multiple myeloma I recommend additional investigation with whole-body PET/CT imaging as well as bone marrow aspirate and biopsy for further evaluation and he is in agreement to proceed We discussed risk and benefits of sedated versus nonsedated bone marrow biopsy and he is in agreement to proceed with an sedated bone marrow biopsy next week I will bring him back within the week after that to discuss test results and treatment plan

## 2023-04-15 NOTE — Progress Notes (Signed)
[facility] OFFICE PROGRESS NOTE  Patient Care Team: [name], MD as PCP - General (Family Medicine) [name], MD as PCP - Cardiology (Cardiology)  ASSESSMENT & PLAN:  MGUS (monoclonal gammopathy of unknown significance) I expressed major concern over recent results of his myeloma panel The patient is currently at smoldering myeloma stage but with rapid increase of his M protein and light chain levels, as well as symptomatic bone pain, I am concerned he might have progressed to multiple myeloma I recommend additional investigation with whole-body PET/CT imaging as well as bone marrow aspirate and biopsy for further evaluation and he is in agreement to proceed We discussed risk and benefits of sedated versus nonsedated bone marrow biopsy and he is in agreement to proceed with an sedated bone marrow biopsy next week I will bring him back within the week after that to discuss test results and treatment plan  Deficiency anemia He has progressive anemia I will order additional workup  Orders Placed This Encounter  Procedures   NM PET Image Initial (PI) Whole Body (F-18 FDG)    Standing Status:   Future    Standing Expiration Date:   04/14/2024    Order Specific Question:   If indicated for the ordered procedure, I authorize the administration of a radiopharmaceutical per Radiology protocol    Answer:   Yes    Order Specific Question:   Preferred imaging location?    Answer:   [place]   CBC with Differential (Cancer Center Only)    Standing Status:   Future    Standing Expiration Date:   04/14/2024   Ferritin    Standing Status:   Future    Standing Expiration Date:   04/14/2024   Iron and Iron Binding Capacity ([facility] only)    Standing Status:   Future    Standing Expiration Date:   04/14/2024   Uric acid    Standing Status:   Future    Standing Expiration Date:   04/14/2024   Beta 2 microglobulin, serum    Standing Status:   Future    Standing  Expiration Date:   04/14/2024   Vitamin B12    Standing Status:   Future    Standing Expiration Date:   04/14/2024   Pretreatment RBC phenotype    Standing Status:   Future    Standing Expiration Date:   04/14/2024    Order Specific Question:   Medication to be given:    Answer:   Daratumumab   Type and screen         Standing Status:   Future    Standing Expiration Date:   04/14/2024    All questions were answered. The patient knows to call the clinic with any problems, questions or concerns. The total time spent in the appointment was 40 minutes encounter with patients including review of chart and various tests results, discussions about plan of care and coordination of care plan   [name], MD 04/15/2023 1:35 PM  INTERVAL HISTORY: Please see below for problem oriented charting. he returns for surveillance follow-up  His recent myeloma panel came back abnormal The patient has intermittent shoulder pain and rib pain over the past 2 weeks We reviewed test results and discussed next step  REVIEW OF SYSTEMS:   Constitutional: Denies fevers, chills or abnormal weight loss Eyes: Denies blurriness of vision Ears, nose, mouth, throat, and face: Denies mucositis or sore throat Respiratory: Denies cough, dyspnea or wheezes Cardiovascular: Denies  palpitation, chest discomfort or lower extremity swelling Gastrointestinal:  Denies nausea, heartburn or change in bowel habits Skin: Denies abnormal skin rashes Lymphatics: Denies new lymphadenopathy or easy bruising Neurological:Denies numbness, tingling or new weaknesses Behavioral/Psych: Mood is stable, no new changes  All other systems were reviewed with the patient and are negative.  I have reviewed the past medical history, past surgical history, social history and family history with the patient and they are unchanged from previous note.  ALLERGIES:  is allergic to cymbalta [duloxetine hcl], lipitor [atorvastatin], and  penicillins.  MEDICATIONS:  Current Outpatient Medications  Medication Sig Dispense Refill   aspirin EC 81 MG tablet Take 1 tablet (81 mg total) by mouth daily. Swallow whole. 30 tablet 0   brimonidine (ALPHAGAN) 0.2 % ophthalmic solution Place 1 drop into both eyes in the morning and at bedtime.     carvedilol (COREG) 3.125 MG tablet Take 1 tablet (3.125 mg total) by mouth 2 (two) times daily. 180 tablet 3   cetirizine (ZYRTEC) 10 MG tablet Take 10 mg by mouth every morning.     Cholecalciferol (VITAMIN D) 2000 UNITS CAPS Take 2,000 Units by mouth daily.     clopidogrel (PLAVIX) 75 MG tablet Take 1 tablet (75 mg total) by mouth daily. 90 tablet 3   Cyanocobalamin (VITAMIN B-12) 5000 MCG TBDP Take 10,000 mcg by mouth every morning.     dorzolamide-timolol (COSOPT) 22.3-6.8 MG/ML ophthalmic solution Place 1 drop into the left eye 2 (two) times daily.     ketoconazole (NIZORAL) 2 % cream Apply 1 application  topically daily as needed for irritation.     latanoprost (XALATAN) 0.005 % ophthalmic solution Place 1 drop into both eyes at bedtime.     lidocaine (XYLOCAINE) 5 % ointment Apply 1 Application topically 2 (two) times daily as needed. 35.44 g 1   MAGNESIUM PO Take 400 mg by mouth daily.     Multiple Vitamins-Minerals (CENTRUM) tablet Take 1 tablet by mouth daily. 50 +     nitroGLYCERIN (NITROSTAT) 0.4 MG SL tablet Place 1 tablet (0.4 mg total) under the tongue every 5 (five) minutes as needed. (Patient not taking: Reported on 02/24/2023) 25 tablet 2   Omega-3 Fatty Acids (FISH OIL) 1200 MG CAPS Take 3,600 mg by mouth daily. 3     pantoprazole (PROTONIX) 40 MG tablet Take 40 mg by mouth daily.      pregabalin (LYRICA) 100 MG capsule TAKE 1 CAPSULE BY MOUTH IN THE MORNING, AND AT NOON, AND 2 CAPSULES AT NIGHT 120 capsule 5   rosuvastatin (CRESTOR) 40 MG tablet TAKE 1 TABLET BY MOUTH DAILY 90 tablet 1   sildenafil (REVATIO) 20 MG tablet Take 20-100 mg by mouth daily as needed (ED).     No  current facility-administered medications for this visit.    SUMMARY OF ONCOLOGIC HISTORY:  Daniel Whitaker is here because of abnormal blood work with IgA monoclonal gammopathy. According to the patient, he has extensive evaluation due to Polyneuropathy. His neurologist was ordering these tests to make sure that this is not related to paraproteinemia related disease. In November 2014, serum protein electrophoresis detected 0.3 M spike. The patient has severe neuropathy affecting the bottom of his feet bilaterally and the toes. He has several nerve conduction studies and was told he has nerve damage. He denies damage to his back from trauma or fall. He also has persistent right thigh numbness and burning sensation. He was placed on Lyrica for long time. He denies  history of abnormal bone pain or bone fracture. Patient denies history of recurrent infection or atypical infections such as shingles of meningitis. Denies chills, night sweats, anorexia or abnormal weight loss  PHYSICAL EXAMINATION: ECOG PERFORMANCE STATUS: 1 - Symptomatic but completely ambulatory  Vitals:   04/15/23 1154  BP: (!) 143/69  Pulse: 77  Resp: 18  Temp: 98.1 F (36.7 C)  SpO2: 97%   Filed Weights   04/15/23 1154  Weight: 245 lb 3.2 oz (111.2 kg)    GENERAL:alert, no distress and comfortable   LABORATORY DATA:  I have reviewed the data as listed    Component Value Date/Time   NA 139 04/12/2023 0908   NA 139 01/12/2017 1011   K 3.9 04/12/2023 0908   K 4.3 01/12/2017 1011   CL 105 04/12/2023 0908   CO2 26 04/12/2023 0908   CO2 23 01/12/2017 1011   GLUCOSE 91 04/12/2023 0908   GLUCOSE 98 01/12/2017 1011   BUN 17 04/12/2023 0908   BUN 21.8 01/12/2017 1011   CREATININE 0.99 04/12/2023 0908   CREATININE 0.9 01/12/2017 1011   CALCIUM 9.8 04/12/2023 0908   CALCIUM 9.0 01/12/2017 1011   PROT 8.2 (H) 04/12/2023 0908   PROT 6.4 01/12/2017 1011   PROT 6.1 01/12/2017 1011   ALBUMIN 3.6 04/12/2023  0908   ALBUMIN 3.6 01/12/2017 1011   AST 14 (L) 04/12/2023 0908   AST 20 01/12/2017 1011   ALT 11 04/12/2023 0908   ALT 18 01/12/2017 1011   ALKPHOS 87 04/12/2023 0908   ALKPHOS 81 01/12/2017 1011   BILITOT 0.4 04/12/2023 0908   BILITOT 0.58 01/12/2017 1011   GFRNONAA >60 04/12/2023 0908   GFRAA >60 02/14/2020 1115    No results found for: "SPEP", "UPEP"  Lab Results  Component Value Date   WBC 7.3 04/12/2023   NEUTROABS 4.4 04/12/2023   HGB 10.2 (L) 04/12/2023   HCT 31.8 (L) 04/12/2023   MCV 89.6 04/12/2023   PLT 306 04/12/2023      Chemistry      Component Value Date/Time   NA 139 04/12/2023 0908   NA 139 01/12/2017 1011   K 3.9 04/12/2023 0908   K 4.3 01/12/2017 1011   CL 105 04/12/2023 0908   CO2 26 04/12/2023 0908   CO2 23 01/12/2017 1011   BUN 17 04/12/2023 0908   BUN 21.8 01/12/2017 1011   CREATININE 0.99 04/12/2023 0908   CREATININE 0.9 01/12/2017 1011      Component Value Date/Time   CALCIUM 9.8 04/12/2023 0908   CALCIUM 9.0 01/12/2017 1011   ALKPHOS 87 04/12/2023 0908   ALKPHOS 81 01/12/2017 1011   AST 14 (L) 04/12/2023 0908   AST 20 01/12/2017 1011   ALT 11 04/12/2023 0908   ALT 18 01/12/2017 1011   BILITOT 0.4 04/12/2023 0908   BILITOT 0.58 01/12/2017 1011

## 2023-04-15 NOTE — Assessment & Plan Note (Signed)
He has progressive anemia I will order additional workup

## 2023-04-16 ENCOUNTER — Telehealth: Payer: Self-pay

## 2023-04-16 ENCOUNTER — Other Ambulatory Visit: Payer: Self-pay

## 2023-04-16 ENCOUNTER — Other Ambulatory Visit: Payer: Self-pay | Admitting: Hematology and Oncology

## 2023-04-16 DIAGNOSIS — D472 Monoclonal gammopathy: Secondary | ICD-10-CM

## 2023-04-16 MED ORDER — DEXAMETHASONE 4 MG PO TABS: 4.0000 mg | ORAL_TABLET | Freq: Every morning | ORAL | 0 refills | Status: DC

## 2023-04-16 NOTE — Telephone Encounter (Signed)
Returned his call. His pain has moved to the right side of his chest. This pain started last night. The pain is worse with sudden movement or cough. With movement or cough it feels like a knife in his shoulder blades. Denies shortness of breath. He has tried tylenol and it is not helping.  He is asking what does Dr. Bertis Ruddy recommends. His pharmacy is [facility]

## 2023-04-16 NOTE — Telephone Encounter (Signed)
Called and given below message. He verbalized understanding. Rx sent to preferred pharmacy. He will call the office back for questions/concerns.

## 2023-04-16 NOTE — Telephone Encounter (Signed)
Returned his call. He has decided that he wants to do the bone marrow under sedation. He ask that Dr. Bertis Ruddy place a order. Told him that the office will cancel the bone marrow and lab appt on 11/20 at [facility]. He verbalized understanding.

## 2023-04-16 NOTE — Telephone Encounter (Signed)
Please call in 4 mg dex PO daily for 30 pills no refills Take with food in the morning

## 2023-04-16 NOTE — Telephone Encounter (Signed)
Called back and given below message. He verbalized understanding. 

## 2023-04-16 NOTE — Telephone Encounter (Signed)
I placed order for CT biopsy Cancel his appt next week We will schedule rtn visit after bone marrow biopsy is done

## 2023-04-19 ENCOUNTER — Other Ambulatory Visit: Payer: Self-pay | Admitting: Cardiology

## 2023-04-20 ENCOUNTER — Ambulatory Visit
Admission: RE | Admit: 2023-04-20 | Discharge: 2023-04-20 | Disposition: A | Discharge status: Home / Self Care | Payer: Medicare Other | Source: Ambulatory Visit | Attending: Family Medicine | Admitting: Family Medicine

## 2023-04-20 ENCOUNTER — Other Ambulatory Visit: Payer: Self-pay | Admitting: Family Medicine

## 2023-04-20 DIAGNOSIS — I700 Atherosclerosis of aorta: Secondary | ICD-10-CM | POA: Diagnosis not present

## 2023-04-20 DIAGNOSIS — K219 Gastro-esophageal reflux disease without esophagitis: Secondary | ICD-10-CM | POA: Diagnosis not present

## 2023-04-20 DIAGNOSIS — R058 Other specified cough: Secondary | ICD-10-CM

## 2023-04-20 DIAGNOSIS — M4800 Spinal stenosis, site unspecified: Secondary | ICD-10-CM | POA: Diagnosis not present

## 2023-04-20 DIAGNOSIS — Z0000 Encounter for general adult medical examination without abnormal findings: Secondary | ICD-10-CM | POA: Diagnosis not present

## 2023-04-20 DIAGNOSIS — I2510 Atherosclerotic heart disease of native coronary artery without angina pectoris: Secondary | ICD-10-CM | POA: Diagnosis not present

## 2023-04-20 DIAGNOSIS — E041 Nontoxic single thyroid nodule: Secondary | ICD-10-CM | POA: Diagnosis not present

## 2023-04-20 DIAGNOSIS — R7309 Other abnormal glucose: Secondary | ICD-10-CM | POA: Diagnosis not present

## 2023-04-20 DIAGNOSIS — R059 Cough, unspecified: Secondary | ICD-10-CM | POA: Diagnosis not present

## 2023-04-20 DIAGNOSIS — E782 Mixed hyperlipidemia: Secondary | ICD-10-CM | POA: Diagnosis not present

## 2023-04-20 DIAGNOSIS — E559 Vitamin D deficiency, unspecified: Secondary | ICD-10-CM | POA: Diagnosis not present

## 2023-04-20 DIAGNOSIS — D472 Monoclonal gammopathy: Secondary | ICD-10-CM | POA: Diagnosis not present

## 2023-04-20 LAB — LAB REPORT - SCANNED
A1c: 5.9
EGFR: 83

## 2023-04-21 ENCOUNTER — Other Ambulatory Visit: Payer: Medicare Other

## 2023-04-22 ENCOUNTER — Telehealth: Payer: Medicare Other | Admitting: Hematology and Oncology

## 2023-04-22 ENCOUNTER — Encounter (HOSPITAL_COMMUNITY)
Admission: RE | Admit: 2023-04-22 | Discharge: 2023-04-22 | Disposition: A | Discharge status: Home / Self Care | Payer: Medicare Other | Source: Ambulatory Visit | Attending: Hematology and Oncology | Admitting: Hematology and Oncology

## 2023-04-22 DIAGNOSIS — C9000 Multiple myeloma not having achieved remission: Secondary | ICD-10-CM | POA: Insufficient documentation

## 2023-04-22 MED ORDER — FLUDEOXYGLUCOSE F - 18 (FDG) INJECTION
1303.0000 | Freq: Once | INTRAVENOUS | Status: AC | PRN
  Administered 2023-04-22: 13.03 via INTRAVENOUS

## 2023-04-23 ENCOUNTER — Ambulatory Visit: Payer: Medicare Other

## 2023-04-26 ENCOUNTER — Other Ambulatory Visit: Payer: Self-pay | Admitting: Urology

## 2023-04-26 DIAGNOSIS — Z01818 Encounter for other preprocedural examination: Secondary | ICD-10-CM

## 2023-04-26 NOTE — H&P (Shared)
Chief Complaint: Patient was seen in consultation today for Bone marrow biopsy and aspiration for myeloma staging, at the request of Gorsuch,Ni  Supervising Physician: Roanna Banning  Patient Status: [facility] - Out-pt  History of Present Illness: Daniel Whitaker is a 77 y.o. male   FULL Code status per patient  According to Dr. Maxine Glenn progress note on 11/14: Daniel Whitaker is followed by Dr. Bertis Ruddy for abnormal blood work with IgA monoclonal gammopathy.  According to the patient, he has extensive evaluation due to Polyneuropathy. His neurologist was ordering these tests to make sure that this is not related to paraproteinemia related disease.  In November 2014, serum protein electrophoresis detected 0.3 M spike. The patient has severe neuropathy affecting the bottom of his feet bilaterally and the toes. He has several nerve conduction studies and was told he has nerve damage. He also has persistent right thigh numbness and burning sensation. He was placed on Lyrica for long time.  He denies history of abnormal bone pain or bone fracture. He denies damage to his back from trauma or fall. Patient denies history of recurrent infection or atypical infections such as shingles of meningitis. Denies chills, night sweats, anorexia or abnormal weight loss  Concerns over recent results of myeloma panel. The patient is currently at smoldering myeloma stage but with rapid increase of his M protein and light chain levels, as well as symptomatic bone pain.  Possible progression to multiple myeloma  Dr. Bertis Ruddy recommend additional investigation with whole-body PET/CT imaging as well as bone marrow aspirate and biopsy for further evaluation. Plan to bring him back the week after that to discuss test results and treatment plan  PET on 11/21 pending final read.  IR was requested for CT bone marrow biopsy and aspiration. Patient is scheduled for same in IR today.   Past Medical History:   Diagnosis Date   Arthritis    Cataract    removed bilaterally    Coronary artery disease    Erectile dysfunction    Esophageal reflux    Glaucoma    Hypertriglyceridemia    Impaired fasting glucose    MGUS (monoclonal gammopathy of unknown significance)    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   Nocturnal leg cramps 03/11/2021   Nontoxic uninodular goiter    Obesity    Peripheral neuropathy    RLS (restless legs syndrome)    Spinal stenosis    Unspecified deficiency anemia 07/11/2013    Past Surgical History:  Procedure Laterality Date   CARDIAC CATHETERIZATION     COLON RESECTION N/A 04/21/2013   Procedure: DIAGNOSTIC LAPAROSCOPY, lysis of adhesions for partial small bowel obstruction;  Surgeon: Adolph Pollack, MD;  Location: [facility] ORS;  Service: General;  Laterality: N/A;   COLON SURGERY     partial SBO    COLONOSCOPY     CORONARY STENT INTERVENTION N/A 10/22/2021   Procedure: CORONARY STENT INTERVENTION;  Surgeon: Marykay Lex, MD;  Location: [facility] INVASIVE CV LAB;  Service: Cardiovascular;  Laterality: N/A;   KNEE SURGERY     Arthroscopic   LEFT HEART CATH AND CORONARY ANGIOGRAPHY N/A 10/22/2021   Procedure: LEFT HEART CATH AND CORONARY ANGIOGRAPHY;  Surgeon: Marykay Lex, MD;  Location: [facility] INVASIVE CV LAB;  Service: Cardiovascular;  Laterality: N/A;   SMALL INTESTINE SURGERY     Blockage    Allergies: Cymbalta [duloxetine hcl], Lipitor [atorvastatin], and Penicillins  Medications: Prior to Admission medications   Medication Sig Start Date End Date  Taking? Authorizing Provider  aspirin EC 81 MG tablet Take 1 tablet (81 mg total) by mouth daily. Swallow whole. 10/22/21   Marykay Lex, MD  brimonidine (ALPHAGAN) 0.2 % ophthalmic solution Place 1 drop into both eyes in the morning and at bedtime. 01/08/20   [provider]  carvedilol (COREG) 3.125 MG tablet Take 1 tablet (3.125 mg total) by mouth 2 (two) times daily. 05/21/22   [name], MD  cetirizine (ZYRTEC) 10 MG tablet Take 10 mg by mouth every morning.    [provider]  Cholecalciferol (VITAMIN D) 2000 UNITS CAPS Take 2,000 Units by mouth daily.    [provider]  clopidogrel (PLAVIX) 75 MG tablet Take 1 tablet (75 mg total) by mouth daily. 05/20/22   [name], MD  Cyanocobalamin (VITAMIN B-12) 5000 MCG TBDP Take 10,000 mcg by mouth every morning.    [provider]  dexamethasone (DECADRON) 4 MG tablet Take 1 tablet (4 mg total) by mouth in the morning. Take with food 04/16/23   [name], MD  dorzolamide-timolol (COSOPT) 22.3-6.8 MG/ML ophthalmic solution Place 1 drop into the left eye 2 (two) times daily. 03/09/18   [provider]  ketoconazole (NIZORAL) 2 % cream Apply 1 application  topically daily as needed for irritation. 11/28/14   [provider]  latanoprost (XALATAN) 0.005 % ophthalmic solution Place 1 drop into both eyes at bedtime. 03/01/13   [provider]  lidocaine (XYLOCAINE) 5 % ointment Apply 1 Application topically 2 (two) times daily as needed. 09/29/22   Butch Penny, NP  MAGNESIUM PO Take 400 mg by mouth daily.    [provider]  Multiple Vitamins-Minerals (CENTRUM) tablet Take 1 tablet by mouth daily. 50 +    [provider]  nitroGLYCERIN (NITROSTAT) 0.4 MG SL tablet Place 1 tablet (0.4 mg total) under the tongue every 5 (five) minutes as needed. Patient not taking: Reported on 02/24/2023 10/22/21   Arty Baumgartner, NP  Omega-3 Fatty Acids (FISH OIL) 1200 MG CAPS Take 3,600 mg by mouth daily. 3    [provider]  pantoprazole (PROTONIX) 40 MG tablet Take 40 mg by mouth daily.  03/14/13   [provider]  pregabalin (LYRICA) 100 MG capsule TAKE 1 CAPSULE BY MOUTH IN THE MORNING, AND AT NOON, AND 2 CAPSULES AT NIGHT 02/17/23   Ocie Doyne, MD  rosuvastatin (CRESTOR) 40 MG tablet TAKE 1 TABLET BY MOUTH DAILY 04/19/23   [name], MD   sildenafil (REVATIO) 20 MG tablet Take 20-100 mg by mouth daily as needed (ED).    [provider]     Family History  Problem Relation Age of Onset   Brain cancer Mother    Lung cancer Father    Heart disease Brother        No details   Dementia Brother    Neuropathy Neg Hx    Colon cancer Neg Hx    Colon polyps Neg Hx    Esophageal cancer Neg Hx    Rectal cancer Neg Hx    Stomach cancer Neg Hx    Sleep apnea Neg Hx     Social History   Socioeconomic History   Marital status: Married    Spouse name: Bonita Quin   Number of children: 2   Years of education: GED   Highest education level: Not on file  Occupational History   Occupation: still working  Tobacco Use   Smoking status: Former    Current  packs/day: 0.00    Types: Cigarettes    Quit date: 06/01/1984    Years since quitting: 38.9   Smokeless tobacco: Never  Vaping Use   Vaping status: Never Used  Substance and Sexual Activity   Alcohol use: Not Currently    Alcohol/week: 10.0 standard drinks of alcohol    Types: 5 Glasses of wine, 5 Cans of beer per week   Drug use: No   Sexual activity: Not on file  Other Topics Concern   Not on file  Social History Narrative   Patient lives at home with his wife Bonita Quin)   Retired.    Patient has two children.   Patient is right-handed.   Patient has his GED.   Patient does not drink caffeine.   Social Determinants of Health   Financial Resource Strain: Not on file  Food Insecurity: Not on file  Transportation Needs: Not on file  Physical Activity: Not on file  Stress: Not on file  Social Connections: Unknown (10/10/2021)   Received from [facility]   Social Network    Social Network: Not on file   Review of Systems: A 12 point ROS discussed and pertinent positives are indicated in the HPI above.  All other systems are negative.  Review of Systems  Constitutional:  Negative for activity change, chills, diaphoresis and fever.   Respiratory:  Negative for chest tightness, shortness of breath and wheezing.   Cardiovascular:  Positive for chest pain.  Gastrointestinal:  Positive for abdominal pain.  Skin:  Negative for color change.  Psychiatric/Behavioral:  Negative for behavioral problems and confusion.     Vital Signs: BP 138/70   Pulse 62   Temp 97.8 F (36.6 C) (Oral)   Resp 18   Ht 6\' 1"  (1.854 m)   Wt 238 lb (108 kg)   SpO2 97%   BMI 31.40 kg/m   Advance Care Plan: The advanced care plan/surrogate decision maker was discussed at the time of visit and documented in the medical record.    Physical Exam Constitutional:      General: He is not in acute distress.    Appearance: Normal appearance.  HENT:     Mouth/Throat:     Mouth: Mucous membranes are moist.  Cardiovascular:     Rate and Rhythm: Normal rate and regular rhythm.     Pulses: Normal pulses.     Heart sounds: Normal heart sounds.  Pulmonary:     Effort: Pulmonary effort is normal. No respiratory distress.     Breath sounds: Normal breath sounds. No wheezing.  Musculoskeletal:        General: Normal range of motion.  Skin:    General: Skin is warm and dry.  Neurological:     Mental Status: He is alert and oriented to person, place, and time.  Psychiatric:        Mood and Affect: Mood normal.        Behavior: Behavior normal.        Thought Content: Thought content normal.        Judgment: Judgment normal.     Imaging: DG Chest 2 View  Result Date: 04/23/2023 CLINICAL DATA:  Cough for 2 months Pain between shoulder blades EXAM: CHEST - 2 VIEW COMPARISON:  08/24/2019 FINDINGS: Cardiomediastinal silhouette and pulmonary vasculature are within normal limits. Lungs are hyperexpanded but otherwise clear. Unchanged dextroconvex curvature of the thoracolumbar spine. IMPRESSION: 1. No acute cardiopulmonary process. 2. Hyperexpanded lungs, suspicious for emphysema. Electronically  Signed   By: Acquanetta Belling M.D.   On: 04/23/2023 16:44     Labs:  CBC: Recent Labs    09/16/22 0805 04/12/23 0908 04/27/23 0728  WBC 6.3 7.3 11.4*  HGB 12.1* 10.2* 9.3*  HCT 36.4* 31.8* 29.0*  PLT 220 306 274    COAGS: Recent Labs    04/27/23 0728  INR 1.1    BMP: Recent Labs    09/16/22 0805 04/12/23 0908  NA 141 139  K 4.4 3.9  CL 106 105  CO2 28 26  GLUCOSE 106* 91  BUN 18 17  CALCIUM 9.9 9.8  CREATININE 0.88 0.99  GFRNONAA >60 >60    LIVER FUNCTION TESTS: Recent Labs    09/16/22 0805 04/12/23 0908  BILITOT 0.3 0.4  AST 21 14*  ALT 17 11  ALKPHOS 92 87  PROT 7.2 8.2*  ALBUMIN 4.1 3.6    TUMOR MARKERS: No results for input(s): "AFPTM", "CEA", "CA199", "CHROMGRNA" in the last 8760 hours.  Assessment and Plan:  Last dose Plavix 11/24. Last dose Asprin 11/25. Concerns over recent results of myeloma panel. The patient is currently at smoldering myeloma stage but with rapid increase of his M protein and light chain levels, as well as symptomatic bone pain.  Possible progression to multiple myeloma  Dr. Bertis Ruddy recommend additional investigation with whole-body PET/CT imaging as well as bone marrow aspirate and biopsy for further evaluation. Plan to bring him back the week after that to discuss test results and treatment plan. PET scan on 11/21 pending final review.  Patient presents for scheduled bone marrow biopsy and aspiration in IR today.  Risks and benefits of bone marrow biopsy and aspiration  was discussed with the patient and/or patient's family including, but not limited to bleeding, infection, damage to adjacent structures or low yield requiring additional tests.  All of the questions were answered and there is agreement to proceed.  Consent signed and in chart.    Thank you for this interesting consult.  I greatly enjoyed meeting Daniel Whitaker and look forward to participating in their care.  A copy of this report was sent to the requesting provider on this date.  Electronically  Signed: Sable Feil, PA-C 04/27/2023, 8:18 AM   I spent a total of  30 Minutes   in face to face in clinical consultation, greater than 50% of which was counseling/coordinating care for bone marrow biopsy and aspiration

## 2023-04-27 ENCOUNTER — Ambulatory Visit (HOSPITAL_COMMUNITY)
Admission: RE | Admit: 2023-04-27 | Discharge: 2023-04-27 | Disposition: A | Discharge status: Home / Self Care | Payer: Medicare Other | Source: Ambulatory Visit | Attending: Hematology and Oncology | Admitting: Hematology and Oncology

## 2023-04-27 ENCOUNTER — Ambulatory Visit (HOSPITAL_COMMUNITY)
Admission: RE | Admit: 2023-04-27 | Discharge: 2023-04-27 | Disposition: A | Discharge status: Home / Self Care | Payer: Medicare Other | Source: Ambulatory Visit | Attending: Hematology and Oncology

## 2023-04-27 ENCOUNTER — Encounter (HOSPITAL_COMMUNITY): Payer: Self-pay

## 2023-04-27 ENCOUNTER — Other Ambulatory Visit: Payer: Self-pay

## 2023-04-27 DIAGNOSIS — G629 Polyneuropathy, unspecified: Secondary | ICD-10-CM | POA: Insufficient documentation

## 2023-04-27 DIAGNOSIS — D472 Monoclonal gammopathy: Secondary | ICD-10-CM

## 2023-04-27 DIAGNOSIS — Z1379 Encounter for other screening for genetic and chromosomal anomalies: Secondary | ICD-10-CM | POA: Insufficient documentation

## 2023-04-27 DIAGNOSIS — M898X9 Other specified disorders of bone, unspecified site: Secondary | ICD-10-CM | POA: Insufficient documentation

## 2023-04-27 DIAGNOSIS — Z01818 Encounter for other preprocedural examination: Secondary | ICD-10-CM

## 2023-04-27 DIAGNOSIS — C9000 Multiple myeloma not having achieved remission: Secondary | ICD-10-CM | POA: Insufficient documentation

## 2023-04-27 DIAGNOSIS — D72829 Elevated white blood cell count, unspecified: Secondary | ICD-10-CM | POA: Insufficient documentation

## 2023-04-27 DIAGNOSIS — Z79899 Other long term (current) drug therapy: Secondary | ICD-10-CM | POA: Insufficient documentation

## 2023-04-27 DIAGNOSIS — D649 Anemia, unspecified: Secondary | ICD-10-CM | POA: Diagnosis not present

## 2023-04-27 LAB — CBC
HCT: 29 % — ABNORMAL LOW (ref 39.0–52.0)
Hemoglobin: 9.3 g/dL — ABNORMAL LOW (ref 13.0–17.0)
MCH: 28.6 pg (ref 26.0–34.0)
MCHC: 32.1 g/dL (ref 30.0–36.0)
MCV: 89.2 fL (ref 80.0–100.0)
Platelets: 274 10*3/uL (ref 150–400)
RBC: 3.25 MIL/uL — ABNORMAL LOW (ref 4.22–5.81)
RDW: 15.6 % — ABNORMAL HIGH (ref 11.5–15.5)
WBC: 11.4 10*3/uL — ABNORMAL HIGH (ref 4.0–10.5)
nRBC: 0 % (ref 0.0–0.2)

## 2023-04-27 LAB — PROTIME-INR
INR: 1.1 (ref 0.8–1.2)
Prothrombin Time: 14.8 s (ref 11.4–15.2)

## 2023-04-27 MED ORDER — MIDAZOLAM HCL 2 MG/2ML IJ SOLN
INTRAMUSCULAR | Status: AC
Start: 2023-04-27 — End: ?
  Filled 2023-04-27: qty 2

## 2023-04-27 MED ORDER — SODIUM CHLORIDE 0.9 % IV SOLN: INTRAVENOUS | Status: DC

## 2023-04-27 MED ORDER — FLUMAZENIL 0.5 MG/5ML IV SOLN
INTRAVENOUS | Status: AC
  Filled 2023-04-27: qty 5

## 2023-04-27 MED ORDER — NALOXONE HCL 0.4 MG/ML IJ SOLN
INTRAMUSCULAR | Status: AC
  Filled 2023-04-27: qty 1

## 2023-04-27 MED ORDER — MIDAZOLAM HCL 2 MG/2ML IJ SOLN
INTRAMUSCULAR | Status: AC | PRN
  Administered 2023-04-27: 1 mg via INTRAVENOUS

## 2023-04-27 MED ORDER — FENTANYL CITRATE (PF) 100 MCG/2ML IJ SOLN
INTRAMUSCULAR | Status: AC | PRN
  Administered 2023-04-27: 50 ug via INTRAVENOUS

## 2023-04-27 MED ORDER — LIDOCAINE HCL 1 % IJ SOLN
INTRAMUSCULAR | Status: AC | PRN
  Administered 2023-04-27: 10 mL via INTRADERMAL

## 2023-04-27 MED ORDER — FENTANYL CITRATE (PF) 100 MCG/2ML IJ SOLN
INTRAMUSCULAR | Status: AC
  Filled 2023-04-27: qty 2

## 2023-04-27 NOTE — Discharge Instructions (Signed)
Please call Interventional Radiology clinic [phone] with any questions or concerns. ? ?You may remove your dressing and shower tomorrow. ? ? ?Bone Marrow Aspiration and Bone Marrow Biopsy, Adult, Care After ?This sheet gives you information about how to care for yourself after your procedure. Your health care provider may also give you more specific instructions. If you have problems or questions, contact your health care provider. ?What can I expect after the procedure? ?After the procedure, it is common to have: ?Mild pain and tenderness. ?Swelling. ?Bruising. ?Follow these instructions at home: ?Puncture site care ?Follow instructions from your health care provider about how to take care of the puncture site. Make sure you: ?Wash your hands with soap and water before and after you change your bandage (dressing). If soap and water are not available, use hand sanitizer. ?Change your dressing as told by your health care provider. ?Check your puncture site every day for signs of infection. Check for: ?More redness, swelling, or pain. ?Fluid or blood. ?Warmth. ?Pus or a bad smell.   ?Activity ?Return to your normal activities as told by your health care provider. Ask your health care provider what activities are safe for you. ?Do not lift anything that is heavier than 10 lb (4.5 kg), or the limit that you are told, until your health care provider says that it is safe. ?Do not drive for 24 hours if you were given a sedative during your procedure. ?General instructions ?Take over-the-counter and prescription medicines only as told by your health care provider. ?Do not take baths, swim, or use a hot tub until your health care provider approves. Ask your health care provider if you may take showers. You may only be allowed to take sponge baths. ?If directed, put ice on the affected area. To do this: ?Put ice in a plastic bag. ?Place a towel between your skin and the bag. ?Leave the ice on for 20 minutes, 2-3 times a  day. ?Keep all follow-up visits as told by your health care provider. This is important.   ?Contact a health care provider if: ?Your pain is not controlled with medicine. ?You have a fever. ?You have more redness, swelling, or pain around the puncture site. ?You have fluid or blood coming from the puncture site. ?Your puncture site feels warm to the touch. ?You have pus or a bad smell coming from the puncture site. ?Summary ?After the procedure, it is common to have mild pain, tenderness, swelling, and bruising. ?Follow instructions from your health care provider about how to take care of the puncture site and what activities are safe for you. ?Take over-the-counter and prescription medicines only as told by your health care provider. ?Contact a health care provider if you have any signs of infection, such as fluid or blood coming from the puncture site. ?This information is not intended to replace advice given to you by your health care provider. Make sure you discuss any questions you have with your health care provider. ?Document Revised: 10/04/2018 Document Reviewed: 10/04/2018 ?Elsevier Patient Education ? 2021 [employer] ? ? ?Moderate Conscious Sedation, Adult, Care After ?This sheet gives you information about how to care for yourself after your procedure. Your health care provider may also give you more specific instructions. If you have problems or questions, contact your health care provider. ?What can I expect after the procedure? ?After the procedure, it is common to have: ?Sleepiness for several hours. ?Impaired judgment for several hours. ?Difficulty with balance. ?Vomiting if you eat too   soon. ?Follow these instructions at home: ?For the time period you were told by your health care provider: ?Rest. ?Do not participate in activities where you could fall or become injured. ?Do not drive or use machinery. ?Do not drink alcohol. ?Do not take sleeping pills or medicines that cause drowsiness. ?Do not  make important decisions or sign legal documents. ?Do not take care of children on your own.  ?  ?  ?Eating and drinking ?Follow the diet recommended by your health care provider. ?Drink enough fluid to keep your urine pale yellow. ?If you vomit: ?Drink water, juice, or soup when you can drink without vomiting. ?Make sure you have little or no nausea before eating solid foods.   ?General instructions ?Take over-the-counter and prescription medicines only as told by your health care provider. ?Have a responsible adult stay with you for the time you are told. It is important to have someone help care for you until you are awake and alert. ?Do not smoke. ?Keep all follow-up visits as told by your health care provider. This is important. ?Contact a health care provider if: ?You are still sleepy or having trouble with balance after 24 hours. ?You feel light-headed. ?You keep feeling nauseous or you keep vomiting. ?You develop a rash. ?You have a fever. ?You have redness or swelling around the IV site. ?Get help right away if: ?You have trouble breathing. ?You have new-onset confusion at home. ?Summary ?After the procedure, it is common to feel sleepy, have impaired judgment, or feel nauseous if you eat too soon. ?Rest after you get home. Know the things you should not do after the procedure. ?Follow the diet recommended by your health care provider and drink enough fluid to keep your urine pale yellow. ?Get help right away if you have trouble breathing or new-onset confusion at home. ?This information is not intended to replace advice given to you by your health care provider. Make sure you discuss any questions you have with your health care provider. ?Document Revised: 09/15/2019 Document Reviewed: 04/13/2019 ?Elsevier Patient Education ? 2021 Elsevier Inc.  ?

## 2023-04-27 NOTE — Procedures (Signed)
Vascular and Interventional Radiology Procedure Note  Patient: [name] DOB: Dec 26, 1945 Medical Record Number: [id number] Note Date/Time: 04/27/23 9:24 AM   Performing Physician: Roanna Banning, MD Assistant(s): None  Diagnosis: Myeloma  Procedure: BONE MARROW ASPIRATION and BIOPSY  Anesthesia: Conscious Sedation Complications: None Estimated Blood Loss: Minimal Specimens: Sent for Pathology  Findings:  Successful CT-guided bone marrow aspiration and biopsy A total of 1 cores were obtained. Hemostasis of the tract was achieved using Manual Pressure.  Plan: Bed rest for 1 hours.  See detailed procedure note with images in PACS. The patient tolerated the procedure well without incident or complication and was returned to Recovery in stable condition.    Roanna Banning, MD [facility]   Pager. [phone] Clinic. [phone]

## 2023-05-02 ENCOUNTER — Inpatient Hospital Stay (HOSPITAL_BASED_OUTPATIENT_CLINIC_OR_DEPARTMENT_OTHER)
Admission: EM | Admit: 2023-05-02 | Discharge: 2023-05-07 | DRG: 840 | Disposition: A | Discharge status: Home Health | Payer: Medicare Other | Attending: Internal Medicine | Admitting: Internal Medicine

## 2023-05-02 ENCOUNTER — Emergency Department (HOSPITAL_BASED_OUTPATIENT_CLINIC_OR_DEPARTMENT_OTHER): Payer: Medicare Other

## 2023-05-02 DIAGNOSIS — J9811 Atelectasis: Secondary | ICD-10-CM | POA: Diagnosis not present

## 2023-05-02 DIAGNOSIS — E781 Pure hyperglyceridemia: Secondary | ICD-10-CM | POA: Diagnosis not present

## 2023-05-02 DIAGNOSIS — G253 Myoclonus: Secondary | ICD-10-CM | POA: Diagnosis present

## 2023-05-02 DIAGNOSIS — Z955 Presence of coronary angioplasty implant and graft: Secondary | ICD-10-CM

## 2023-05-02 DIAGNOSIS — E860 Dehydration: Secondary | ICD-10-CM | POA: Diagnosis not present

## 2023-05-02 DIAGNOSIS — Z8249 Family history of ischemic heart disease and other diseases of the circulatory system: Secondary | ICD-10-CM

## 2023-05-02 DIAGNOSIS — C9000 Multiple myeloma not having achieved remission: Secondary | ICD-10-CM | POA: Diagnosis not present

## 2023-05-02 DIAGNOSIS — G893 Neoplasm related pain (acute) (chronic): Secondary | ICD-10-CM | POA: Diagnosis not present

## 2023-05-02 DIAGNOSIS — N179 Acute kidney failure, unspecified: Secondary | ICD-10-CM | POA: Diagnosis not present

## 2023-05-02 DIAGNOSIS — D630 Anemia in neoplastic disease: Secondary | ICD-10-CM | POA: Diagnosis present

## 2023-05-02 DIAGNOSIS — M8448XA Pathological fracture, other site, initial encounter for fracture: Secondary | ICD-10-CM | POA: Diagnosis not present

## 2023-05-02 DIAGNOSIS — H409 Unspecified glaucoma: Secondary | ICD-10-CM | POA: Diagnosis present

## 2023-05-02 DIAGNOSIS — G4733 Obstructive sleep apnea (adult) (pediatric): Secondary | ICD-10-CM | POA: Diagnosis present

## 2023-05-02 DIAGNOSIS — G9341 Metabolic encephalopathy: Secondary | ICD-10-CM | POA: Diagnosis not present

## 2023-05-02 DIAGNOSIS — R0902 Hypoxemia: Secondary | ICD-10-CM | POA: Diagnosis present

## 2023-05-02 DIAGNOSIS — S2249XA Multiple fractures of ribs, unspecified side, initial encounter for closed fracture: Secondary | ICD-10-CM

## 2023-05-02 DIAGNOSIS — K5900 Constipation, unspecified: Secondary | ICD-10-CM | POA: Diagnosis present

## 2023-05-02 DIAGNOSIS — I10 Essential (primary) hypertension: Secondary | ICD-10-CM | POA: Diagnosis not present

## 2023-05-02 DIAGNOSIS — R079 Chest pain, unspecified: Secondary | ICD-10-CM | POA: Diagnosis not present

## 2023-05-02 DIAGNOSIS — E669 Obesity, unspecified: Secondary | ICD-10-CM | POA: Diagnosis present

## 2023-05-02 DIAGNOSIS — G2581 Restless legs syndrome: Secondary | ICD-10-CM | POA: Diagnosis not present

## 2023-05-02 DIAGNOSIS — S2243XA Multiple fractures of ribs, bilateral, initial encounter for closed fracture: Secondary | ICD-10-CM

## 2023-05-02 DIAGNOSIS — Z801 Family history of malignant neoplasm of trachea, bronchus and lung: Secondary | ICD-10-CM

## 2023-05-02 DIAGNOSIS — Z79899 Other long term (current) drug therapy: Secondary | ICD-10-CM | POA: Diagnosis not present

## 2023-05-02 DIAGNOSIS — Z7982 Long term (current) use of aspirin: Secondary | ICD-10-CM

## 2023-05-02 DIAGNOSIS — Z6831 Body mass index (BMI) 31.0-31.9, adult: Secondary | ICD-10-CM

## 2023-05-02 DIAGNOSIS — D638 Anemia in other chronic diseases classified elsewhere: Secondary | ICD-10-CM

## 2023-05-02 DIAGNOSIS — Z7902 Long term (current) use of antithrombotics/antiplatelets: Secondary | ICD-10-CM

## 2023-05-02 DIAGNOSIS — I2510 Atherosclerotic heart disease of native coronary artery without angina pectoris: Secondary | ICD-10-CM | POA: Diagnosis present

## 2023-05-02 DIAGNOSIS — Z880 Allergy status to penicillin: Secondary | ICD-10-CM

## 2023-05-02 DIAGNOSIS — Z87891 Personal history of nicotine dependence: Secondary | ICD-10-CM | POA: Diagnosis not present

## 2023-05-02 DIAGNOSIS — R0602 Shortness of breath: Secondary | ICD-10-CM | POA: Diagnosis not present

## 2023-05-02 DIAGNOSIS — G629 Polyneuropathy, unspecified: Secondary | ICD-10-CM | POA: Diagnosis not present

## 2023-05-02 DIAGNOSIS — E66811 Obesity, class 1: Secondary | ICD-10-CM | POA: Diagnosis present

## 2023-05-02 DIAGNOSIS — Z888 Allergy status to other drugs, medicaments and biological substances status: Secondary | ICD-10-CM

## 2023-05-02 DIAGNOSIS — J189 Pneumonia, unspecified organism: Secondary | ICD-10-CM

## 2023-05-02 DIAGNOSIS — G9349 Other encephalopathy: Secondary | ICD-10-CM | POA: Diagnosis not present

## 2023-05-02 DIAGNOSIS — E790 Hyperuricemia without signs of inflammatory arthritis and tophaceous disease: Secondary | ICD-10-CM | POA: Diagnosis not present

## 2023-05-02 DIAGNOSIS — Z808 Family history of malignant neoplasm of other organs or systems: Secondary | ICD-10-CM

## 2023-05-02 DIAGNOSIS — R0789 Other chest pain: Secondary | ICD-10-CM | POA: Diagnosis not present

## 2023-05-02 LAB — CBC WITH DIFFERENTIAL/PLATELET
Abs Immature Granulocytes: 0.15 10*3/uL — ABNORMAL HIGH (ref 0.00–0.07)
Basophils Absolute: 0 10*3/uL (ref 0.0–0.1)
Basophils Relative: 0 %
Eosinophils Absolute: 0.1 10*3/uL (ref 0.0–0.5)
Eosinophils Relative: 1 %
HCT: 28 % — ABNORMAL LOW (ref 39.0–52.0)
Hemoglobin: 8.8 g/dL — ABNORMAL LOW (ref 13.0–17.0)
Immature Granulocytes: 1 %
Lymphocytes Relative: 23 %
Lymphs Abs: 2.6 10*3/uL (ref 0.7–4.0)
MCH: 28.3 pg (ref 26.0–34.0)
MCHC: 31.4 g/dL (ref 30.0–36.0)
MCV: 90 fL (ref 80.0–100.0)
Monocytes Absolute: 0.9 10*3/uL (ref 0.1–1.0)
Monocytes Relative: 8 %
Neutro Abs: 7.3 10*3/uL (ref 1.7–7.7)
Neutrophils Relative %: 67 %
Platelets: 244 10*3/uL (ref 150–400)
RBC: 3.11 MIL/uL — ABNORMAL LOW (ref 4.22–5.81)
RDW: 15.9 % — ABNORMAL HIGH (ref 11.5–15.5)
WBC: 11 10*3/uL — ABNORMAL HIGH (ref 4.0–10.5)
nRBC: 0 % (ref 0.0–0.2)

## 2023-05-02 MED ORDER — FENTANYL CITRATE PF 50 MCG/ML IJ SOSY
50.0000 ug | PREFILLED_SYRINGE | Freq: Once | INTRAMUSCULAR | Status: AC
  Administered 2023-05-02: 50 ug via INTRAVENOUS
  Filled 2023-05-02: qty 1

## 2023-05-02 NOTE — ED Triage Notes (Signed)
Pt POV with family reporting SOB +  pain in R side and R side of back. Reports pain started after coughing fit on Friday. Mutliple myeloma, bone marrow biopsy performed 11/26, hgb 9.3. Unable to speak full sentences in triage.

## 2023-05-02 NOTE — ED Provider Notes (Signed)
[place] EMERGENCY DEPARTMENT AT [facility]  Provider Note  CSN: [id number] Arrival date & time: 05/02/23 2153  History No chief complaint on file.   Daniel Whitaker is a 77 y.o. male with history of MGUS, recently found to likely have progressed to MM, here for 2 days of severe R rib pain, started after he had a coughing spell 2 days ago. Has had pain with deep breath, movement and coughing since then. He has been coughing for the last several weeks, had CXR which was neg for PNA on 11/19. Had a bone marrow biopsy and PET/CT 11/21 but results not available yet. No fever. Does not have pain medication at home.    Home Medications Prior to Admission medications   Medication Sig Start Date End Date Taking? Authorizing Provider  aspirin EC 81 MG tablet Take 1 tablet (81 mg total) by mouth daily. Swallow whole. 10/22/21   Marykay Lex, MD  brimonidine (ALPHAGAN) 0.2 % ophthalmic solution Place 1 drop into both eyes in the morning and at bedtime. 01/08/20   [provider]  carvedilol (COREG) 3.125 MG tablet Take 1 tablet (3.125 mg total) by mouth 2 (two) times daily. 05/21/22   [name], MD  cetirizine (ZYRTEC) 10 MG tablet Take 10 mg by mouth every morning.    [provider]  Cholecalciferol (VITAMIN D) 2000 UNITS CAPS Take 2,000 Units by mouth daily.    [provider]  clopidogrel (PLAVIX) 75 MG tablet Take 1 tablet (75 mg total) by mouth daily. 05/20/22   [name], MD  Cyanocobalamin (VITAMIN B-12) 5000 MCG TBDP Take 10,000 mcg by mouth every morning.    [provider]  dexamethasone (DECADRON) 4 MG tablet Take 1 tablet (4 mg total) by mouth in the morning. Take with food 04/16/23   [name], MD  dorzolamide-timolol (COSOPT) 22.3-6.8 MG/ML ophthalmic solution Place 1 drop into the left eye 2 (two) times daily. 03/09/18   [provider]  ketoconazole (NIZORAL) 2 % cream Apply 1 application  topically daily as  needed for irritation. 11/28/14   [provider]  latanoprost (XALATAN) 0.005 % ophthalmic solution Place 1 drop into both eyes at bedtime. 03/01/13   [provider]  lidocaine (XYLOCAINE) 5 % ointment Apply 1 Application topically 2 (two) times daily as needed. 09/29/22   Butch Penny, NP  MAGNESIUM PO Take 400 mg by mouth daily.    [provider]  Multiple Vitamins-Minerals (CENTRUM) tablet Take 1 tablet by mouth daily. 50 +    [provider]  nitroGLYCERIN (NITROSTAT) 0.4 MG SL tablet Place 1 tablet (0.4 mg total) under the tongue every 5 (five) minutes as needed. Patient not taking: Reported on 02/24/2023 10/22/21   Arty Baumgartner, NP  Omega-3 Fatty Acids (FISH OIL) 1200 MG CAPS Take 3,600 mg by mouth daily. 3    [provider]  pantoprazole (PROTONIX) 40 MG tablet Take 40 mg by mouth daily.  03/14/13   [provider]  pregabalin (LYRICA) 100 MG capsule TAKE 1 CAPSULE BY MOUTH IN THE MORNING, AND AT NOON, AND 2 CAPSULES AT NIGHT 02/17/23   Ocie Doyne, MD  rosuvastatin (CRESTOR) 40 MG tablet TAKE 1 TABLET BY MOUTH DAILY 04/19/23   [name], MD  sildenafil (REVATIO) 20 MG tablet Take 20-100 mg by mouth daily as needed (ED).    [provider]     Allergies    Cymbalta [duloxetine hcl], Lipitor [atorvastatin], and Penicillins  Review of Systems   Review of Systems Please see HPI for pertinent positives and negatives  Physical Exam BP 139/64   Pulse 80   Temp 97.9 F (36.6 C) (Oral)   Resp (!) 21   Ht 6\' 1"  (1.854 m)   Wt 107.9 kg   SpO2 95%   BMI 31.38 kg/m   Physical Exam Vitals and nursing note reviewed.  Constitutional:      Appearance: Normal appearance.  HENT:     Head: Normocephalic and atraumatic.     Nose: Nose normal.     Mouth/Throat:     Mouth: Mucous membranes are moist.  Eyes:     Extraocular Movements: Extraocular movements intact.     Conjunctiva/sclera: Conjunctivae  normal.  Cardiovascular:     Rate and Rhythm: Normal rate.  Pulmonary:     Effort: Pulmonary effort is normal.     Breath sounds: Normal breath sounds.  Chest:     Chest wall: Tenderness (R lateral ribs) present.  Abdominal:     General: Abdomen is flat.     Palpations: Abdomen is soft.     Tenderness: There is no abdominal tenderness.  Musculoskeletal:        General: No swelling. Normal range of motion.     Cervical back: Neck supple.  Skin:    General: Skin is warm and dry.  Neurological:     General: No focal deficit present.     Mental Status: He is alert.  Psychiatric:        Mood and Affect: Mood normal.     ED Results / Procedures / Treatments   EKG None  Procedures Procedures  Medications Ordered in the ED Medications  fentaNYL (SUBLIMAZE) injection 50 mcg (has no administration in time range)    Initial Impression and Plan  Patient with chest wall pain after recent coughing fit in setting of new concerns for progression of MGUS to MM. I suspect he has a pathologic rib fracture. Will check labs, CXR and begin pain medication.   ED Course       MDM Rules/Calculators/A&P Medical Decision Making Amount and/or Complexity of Data Reviewed Labs: ordered. Radiology: ordered.  Risk Prescription drug management.     Final Clinical Impression(s) / ED Diagnoses Final diagnoses:  None    Rx / DC Orders ED Discharge Orders     None

## 2023-05-03 ENCOUNTER — Encounter (HOSPITAL_COMMUNITY): Payer: Self-pay

## 2023-05-03 ENCOUNTER — Other Ambulatory Visit: Payer: Self-pay

## 2023-05-03 ENCOUNTER — Emergency Department (HOSPITAL_BASED_OUTPATIENT_CLINIC_OR_DEPARTMENT_OTHER): Payer: Medicare Other

## 2023-05-03 DIAGNOSIS — Z79899 Other long term (current) drug therapy: Secondary | ICD-10-CM | POA: Diagnosis not present

## 2023-05-03 DIAGNOSIS — Z87891 Personal history of nicotine dependence: Secondary | ICD-10-CM | POA: Diagnosis not present

## 2023-05-03 DIAGNOSIS — G9341 Metabolic encephalopathy: Secondary | ICD-10-CM | POA: Diagnosis not present

## 2023-05-03 DIAGNOSIS — N179 Acute kidney failure, unspecified: Secondary | ICD-10-CM

## 2023-05-03 DIAGNOSIS — D630 Anemia in neoplastic disease: Secondary | ICD-10-CM | POA: Diagnosis present

## 2023-05-03 DIAGNOSIS — G629 Polyneuropathy, unspecified: Secondary | ICD-10-CM | POA: Diagnosis present

## 2023-05-03 DIAGNOSIS — H409 Unspecified glaucoma: Secondary | ICD-10-CM | POA: Diagnosis present

## 2023-05-03 DIAGNOSIS — G4733 Obstructive sleep apnea (adult) (pediatric): Secondary | ICD-10-CM | POA: Diagnosis present

## 2023-05-03 DIAGNOSIS — E860 Dehydration: Secondary | ICD-10-CM | POA: Diagnosis present

## 2023-05-03 DIAGNOSIS — G2581 Restless legs syndrome: Secondary | ICD-10-CM | POA: Diagnosis present

## 2023-05-03 DIAGNOSIS — M8448XA Pathological fracture, other site, initial encounter for fracture: Secondary | ICD-10-CM | POA: Diagnosis present

## 2023-05-03 DIAGNOSIS — C9000 Multiple myeloma not having achieved remission: Secondary | ICD-10-CM | POA: Diagnosis not present

## 2023-05-03 DIAGNOSIS — E781 Pure hyperglyceridemia: Secondary | ICD-10-CM | POA: Diagnosis present

## 2023-05-03 DIAGNOSIS — I10 Essential (primary) hypertension: Secondary | ICD-10-CM | POA: Diagnosis present

## 2023-05-03 DIAGNOSIS — I2510 Atherosclerotic heart disease of native coronary artery without angina pectoris: Secondary | ICD-10-CM | POA: Diagnosis present

## 2023-05-03 DIAGNOSIS — G893 Neoplasm related pain (acute) (chronic): Secondary | ICD-10-CM | POA: Diagnosis present

## 2023-05-03 DIAGNOSIS — S2243XA Multiple fractures of ribs, bilateral, initial encounter for closed fracture: Secondary | ICD-10-CM | POA: Diagnosis not present

## 2023-05-03 DIAGNOSIS — J9811 Atelectasis: Secondary | ICD-10-CM | POA: Diagnosis not present

## 2023-05-03 DIAGNOSIS — Z6831 Body mass index (BMI) 31.0-31.9, adult: Secondary | ICD-10-CM | POA: Diagnosis not present

## 2023-05-03 DIAGNOSIS — E669 Obesity, unspecified: Secondary | ICD-10-CM | POA: Diagnosis present

## 2023-05-03 DIAGNOSIS — K5900 Constipation, unspecified: Secondary | ICD-10-CM | POA: Diagnosis present

## 2023-05-03 DIAGNOSIS — G253 Myoclonus: Secondary | ICD-10-CM | POA: Diagnosis present

## 2023-05-03 DIAGNOSIS — Z955 Presence of coronary angioplasty implant and graft: Secondary | ICD-10-CM | POA: Diagnosis not present

## 2023-05-03 DIAGNOSIS — S2249XA Multiple fractures of ribs, unspecified side, initial encounter for closed fracture: Secondary | ICD-10-CM

## 2023-05-03 DIAGNOSIS — R0902 Hypoxemia: Secondary | ICD-10-CM | POA: Diagnosis present

## 2023-05-03 DIAGNOSIS — J189 Pneumonia, unspecified organism: Secondary | ICD-10-CM

## 2023-05-03 DIAGNOSIS — R918 Other nonspecific abnormal finding of lung field: Secondary | ICD-10-CM | POA: Diagnosis not present

## 2023-05-03 DIAGNOSIS — N200 Calculus of kidney: Secondary | ICD-10-CM | POA: Diagnosis not present

## 2023-05-03 LAB — CBC
HCT: 27.6 % — ABNORMAL LOW (ref 39.0–52.0)
Hemoglobin: 8.4 g/dL — ABNORMAL LOW (ref 13.0–17.0)
MCH: 28.6 pg (ref 26.0–34.0)
MCHC: 30.4 g/dL (ref 30.0–36.0)
MCV: 93.9 fL (ref 80.0–100.0)
Platelets: 205 10*3/uL (ref 150–400)
RBC: 2.94 MIL/uL — ABNORMAL LOW (ref 4.22–5.81)
RDW: 15.9 % — ABNORMAL HIGH (ref 11.5–15.5)
WBC: 9.2 10*3/uL (ref 4.0–10.5)
nRBC: 0 % (ref 0.0–0.2)

## 2023-05-03 LAB — BASIC METABOLIC PANEL
Anion gap: 8 (ref 5–15)
Anion gap: 8 (ref 5–15)
BUN: 49 mg/dL — ABNORMAL HIGH (ref 8–23)
BUN: 43 mg/dL — ABNORMAL HIGH (ref 8–23)
CO2: 33 mmol/L — ABNORMAL HIGH (ref 22–32)
CO2: 29 mmol/L (ref 22–32)
Calcium: 13.7 mg/dL (ref 8.9–10.3)
Calcium: 13.1 mg/dL (ref 8.9–10.3)
Chloride: 99 mmol/L (ref 98–111)
Chloride: 103 mmol/L (ref 98–111)
Creatinine, Ser: 1.83 mg/dL — ABNORMAL HIGH (ref 0.61–1.24)
Creatinine, Ser: 1.7 mg/dL — ABNORMAL HIGH (ref 0.61–1.24)
GFR, Estimated: 38 mL/min — ABNORMAL LOW (ref >60)
GFR, Estimated: 41 mL/min — ABNORMAL LOW (ref >60)
Glucose, Bld: 112 mg/dL — ABNORMAL HIGH (ref 70–99)
Glucose, Bld: 93 mg/dL (ref 70–99)
Potassium: 3.7 mmol/L (ref 3.5–5.1)
Potassium: 3.8 mmol/L (ref 3.5–5.1)
Sodium: 140 mmol/L (ref 135–145)
Sodium: 140 mmol/L (ref 135–145)

## 2023-05-03 LAB — VITAMIN D 25 HYDROXY (VIT D DEFICIENCY, FRACTURES): Vit D, 25-Hydroxy: 67.97 ng/mL (ref 30–100)

## 2023-05-03 LAB — COMPREHENSIVE METABOLIC PANEL
ALT: 15 U/L (ref 0–44)
AST: 14 U/L — ABNORMAL LOW (ref 15–41)
Albumin: 2.5 g/dL — ABNORMAL LOW (ref 3.5–5.0)
Alkaline Phosphatase: 67 U/L (ref 38–126)
Anion gap: 4 — ABNORMAL LOW (ref 5–15)
BUN: 40 mg/dL — ABNORMAL HIGH (ref 8–23)
CO2: 26 mmol/L (ref 22–32)
Calcium: 12.1 mg/dL — ABNORMAL HIGH (ref 8.9–10.3)
Chloride: 107 mmol/L (ref 98–111)
Creatinine, Ser: 1.65 mg/dL — ABNORMAL HIGH (ref 0.61–1.24)
GFR, Estimated: 43 mL/min — ABNORMAL LOW (ref >60)
Glucose, Bld: 110 mg/dL — ABNORMAL HIGH (ref 70–99)
Potassium: 3.3 mmol/L — ABNORMAL LOW (ref 3.5–5.1)
Sodium: 137 mmol/L (ref 135–145)
Total Bilirubin: 0.4 mg/dL (ref <1.2)
Total Protein: 7.9 g/dL (ref 6.5–8.1)

## 2023-05-03 LAB — PROTIME-INR
INR: 1.3 — ABNORMAL HIGH (ref 0.8–1.2)
Prothrombin Time: 16.1 s — ABNORMAL HIGH (ref 11.4–15.2)

## 2023-05-03 LAB — SURGICAL PATHOLOGY

## 2023-05-03 LAB — PHOSPHORUS: Phosphorus: 4.3 mg/dL (ref 2.5–4.6)

## 2023-05-03 LAB — APTT: aPTT: 33 s (ref 24–36)

## 2023-05-03 MED ORDER — LORATADINE 10 MG PO TABS
10.0000 mg | ORAL_TABLET | Freq: Every day | ORAL | Status: DC
  Administered 2023-05-03 – 2023-05-07 (×5): 10 mg via ORAL
  Filled 2023-05-03 (×5): qty 1

## 2023-05-03 MED ORDER — ALBUTEROL SULFATE (2.5 MG/3ML) 0.083% IN NEBU: 3.0000 mL | INHALATION_SOLUTION | Freq: Four times a day (QID) | RESPIRATORY_TRACT | Status: DC | PRN

## 2023-05-03 MED ORDER — CLOPIDOGREL BISULFATE 75 MG PO TABS
75.0000 mg | ORAL_TABLET | Freq: Every day | ORAL | Status: DC
  Administered 2023-05-03 – 2023-05-07 (×5): 75 mg via ORAL
  Filled 2023-05-03 (×5): qty 1

## 2023-05-03 MED ORDER — SODIUM CHLORIDE 0.9% FLUSH
3.0000 mL | Freq: Two times a day (BID) | INTRAVENOUS | Status: DC
  Administered 2023-05-03 – 2023-05-07 (×8): 3 mL via INTRAVENOUS

## 2023-05-03 MED ORDER — SODIUM CHLORIDE 0.9 % IV SOLN
60.0000 mg | Freq: Once | INTRAVENOUS | Status: AC
  Administered 2023-05-03: 60 mg via INTRAVENOUS
  Filled 2023-05-03: qty 10

## 2023-05-03 MED ORDER — ASPIRIN 81 MG PO TBEC
81.0000 mg | DELAYED_RELEASE_TABLET | Freq: Every day | ORAL | Status: DC
  Administered 2023-05-03 – 2023-05-07 (×5): 81 mg via ORAL
  Filled 2023-05-03 (×5): qty 1

## 2023-05-03 MED ORDER — SENNOSIDES-DOCUSATE SODIUM 8.6-50 MG PO TABS
2.0000 | ORAL_TABLET | Freq: Two times a day (BID) | ORAL | Status: DC
  Administered 2023-05-03 – 2023-05-07 (×7): 2 via ORAL
  Filled 2023-05-03 (×7): qty 2

## 2023-05-03 MED ORDER — ACETAMINOPHEN 325 MG PO TABS: 650.0000 mg | ORAL_TABLET | Freq: Four times a day (QID) | ORAL | Status: DC | PRN

## 2023-05-03 MED ORDER — IOHEXOL 300 MG/ML  SOLN
100.0000 mL | Freq: Once | INTRAMUSCULAR | Status: AC | PRN
  Administered 2023-05-03: 60 mL via INTRAVENOUS

## 2023-05-03 MED ORDER — SODIUM CHLORIDE 0.9 % IV BOLUS
1000.0000 mL | Freq: Once | INTRAVENOUS | Status: AC
  Administered 2023-05-03: 1000 mL via INTRAVENOUS

## 2023-05-03 MED ORDER — SODIUM CHLORIDE 0.9 % IV SOLN
500.0000 mg | INTRAVENOUS | Status: DC
  Administered 2023-05-03: 500 mg via INTRAVENOUS
  Filled 2023-05-03: qty 5

## 2023-05-03 MED ORDER — MORPHINE SULFATE (PF) 4 MG/ML IV SOLN
4.0000 mg | INTRAVENOUS | Status: DC | PRN
  Administered 2023-05-03: 4 mg via INTRAVENOUS
  Filled 2023-05-03 (×2): qty 1

## 2023-05-03 MED ORDER — DORZOLAMIDE HCL-TIMOLOL MAL 2-0.5 % OP SOLN
1.0000 [drp] | Freq: Two times a day (BID) | OPHTHALMIC | Status: DC
Start: 2023-05-03 — End: 2023-05-07
  Administered 2023-05-03 – 2023-05-07 (×8): 1 [drp] via OPHTHALMIC
  Filled 2023-05-03: qty 10

## 2023-05-03 MED ORDER — POLYETHYLENE GLYCOL 3350 17 G PO PACK: 17.0000 g | PACK | Freq: Every day | ORAL | Status: DC | PRN

## 2023-05-03 MED ORDER — PANTOPRAZOLE SODIUM 40 MG PO TBEC
40.0000 mg | DELAYED_RELEASE_TABLET | Freq: Every day | ORAL | Status: DC
  Administered 2023-05-04 – 2023-05-07 (×4): 40 mg via ORAL
  Filled 2023-05-03 (×4): qty 1

## 2023-05-03 MED ORDER — MAGNESIUM OXIDE -MG SUPPLEMENT 400 (240 MG) MG PO TABS
400.0000 mg | ORAL_TABLET | Freq: Every day | ORAL | Status: DC
  Administered 2023-05-03 – 2023-05-07 (×5): 400 mg via ORAL
  Filled 2023-05-03 (×5): qty 1

## 2023-05-03 MED ORDER — LATANOPROST 0.005 % OP SOLN
1.0000 [drp] | Freq: Every day | OPHTHALMIC | Status: DC
Start: 2023-05-03 — End: 2023-05-07
  Administered 2023-05-03 – 2023-05-06 (×4): 1 [drp] via OPHTHALMIC
  Filled 2023-05-03: qty 2.5

## 2023-05-03 MED ORDER — SODIUM CHLORIDE 0.9 % IV SOLN
1.0000 g | INTRAVENOUS | Status: DC
  Administered 2023-05-03 – 2023-05-04 (×2): 1 g via INTRAVENOUS
  Filled 2023-05-03 (×2): qty 10

## 2023-05-03 MED ORDER — POTASSIUM CHLORIDE 20 MEQ PO PACK
40.0000 meq | PACK | Freq: Once | ORAL | Status: AC
  Administered 2023-05-03: 40 meq via ORAL
  Filled 2023-05-03: qty 2

## 2023-05-03 MED ORDER — OMEGA-3-ACID ETHYL ESTERS 1 G PO CAPS
2.0000 g | ORAL_CAPSULE | Freq: Every day | ORAL | Status: DC
  Administered 2023-05-03: 2 g via ORAL
  Filled 2023-05-03: qty 2

## 2023-05-03 MED ORDER — OXYCODONE HCL 5 MG PO TABS
5.0000 mg | ORAL_TABLET | ORAL | Status: DC | PRN
  Administered 2023-05-06 – 2023-05-07 (×4): 5 mg via ORAL
  Filled 2023-05-03 (×4): qty 1

## 2023-05-03 MED ORDER — OXYCODONE HCL ER 10 MG PO T12A
10.0000 mg | EXTENDED_RELEASE_TABLET | Freq: Once | ORAL | Status: AC
  Administered 2023-05-03: 10 mg via ORAL
  Filled 2023-05-03: qty 1

## 2023-05-03 MED ORDER — SODIUM CHLORIDE 0.9 % IV SOLN: INTRAVENOUS | Status: AC

## 2023-05-03 MED ORDER — HEPARIN SODIUM (PORCINE) 5000 UNIT/ML IJ SOLN
5000.0000 [IU] | Freq: Three times a day (TID) | INTRAMUSCULAR | Status: DC
  Administered 2023-05-04: 5000 [IU] via SUBCUTANEOUS
  Filled 2023-05-03: qty 1

## 2023-05-03 MED ORDER — ROSUVASTATIN CALCIUM 20 MG PO TABS
40.0000 mg | ORAL_TABLET | Freq: Every day | ORAL | Status: DC
  Administered 2023-05-03 – 2023-05-07 (×5): 40 mg via ORAL
  Filled 2023-05-03 (×5): qty 2

## 2023-05-03 MED ORDER — CYCLOBENZAPRINE HCL 10 MG PO TABS: 10.0000 mg | ORAL_TABLET | Freq: Three times a day (TID) | ORAL | Status: DC | PRN

## 2023-05-03 MED ORDER — ACETAMINOPHEN 325 MG PO TABS
650.0000 mg | ORAL_TABLET | ORAL | Status: DC
  Administered 2023-05-03 – 2023-05-04 (×5): 650 mg via ORAL
  Filled 2023-05-03 (×5): qty 2

## 2023-05-03 MED ORDER — MORPHINE SULFATE (PF) 2 MG/ML IV SOLN: 2.0000 mg | INTRAVENOUS | Status: DC | PRN

## 2023-05-03 MED ORDER — SODIUM CHLORIDE 0.9 % IV SOLN
1.0000 g | Freq: Once | INTRAVENOUS | Status: AC
  Administered 2023-05-03: 1 g via INTRAVENOUS
  Filled 2023-05-03: qty 10

## 2023-05-03 MED ORDER — PREGABALIN 50 MG PO CAPS
100.0000 mg | ORAL_CAPSULE | Freq: Two times a day (BID) | ORAL | Status: DC
  Administered 2023-05-03 – 2023-05-04 (×2): 100 mg via ORAL
  Filled 2023-05-03 (×2): qty 2

## 2023-05-03 MED ORDER — ACETAMINOPHEN 650 MG RE SUPP: 650.0000 mg | Freq: Four times a day (QID) | RECTAL | Status: DC | PRN

## 2023-05-03 MED ORDER — SODIUM CHLORIDE 0.9 % IV SOLN
500.0000 mg | Freq: Once | INTRAVENOUS | Status: AC
  Administered 2023-05-03: 500 mg via INTRAVENOUS
  Filled 2023-05-03: qty 5

## 2023-05-03 MED ORDER — LIDOCAINE 5 % EX OINT: 1.0000 | TOPICAL_OINTMENT | Freq: Two times a day (BID) | CUTANEOUS | Status: DC | PRN

## 2023-05-03 MED ORDER — BRIMONIDINE TARTRATE 0.2 % OP SOLN
1.0000 [drp] | Freq: Two times a day (BID) | OPHTHALMIC | Status: DC
Start: 2023-05-03 — End: 2023-05-07
  Administered 2023-05-03 – 2023-05-07 (×8): 1 [drp] via OPHTHALMIC
  Filled 2023-05-03: qty 5

## 2023-05-03 MED ORDER — CARVEDILOL 3.125 MG PO TABS
3.1250 mg | ORAL_TABLET | Freq: Two times a day (BID) | ORAL | Status: DC
  Administered 2023-05-03 – 2023-05-07 (×8): 3.125 mg via ORAL
  Filled 2023-05-03 (×8): qty 1

## 2023-05-03 NOTE — ED Notes (Signed)
Patient transported to CT via stretcher.

## 2023-05-03 NOTE — ED Notes (Signed)
Patient denies pain and is resting comfortably.  

## 2023-05-03 NOTE — Assessment & Plan Note (Addendum)
Symptomatic with constipation.  Likely due to multiple myeloma as well as pathologic fractures.  At this time pending ionized calcium.  However it is at least moderately severe at the time of presentation.  Has improved somewhat with IV hydration.  Patient's mental status is felt to be normal and at this time somnolence is felt to be due to morphine use.  Hold off on Vitamin D3.  Patient is not a candidate for calcitonin at this time due to improvement of calcium.  Patient will need IV pamidronic acid to control calcium.  Patient is pending PTH and vitamin D level, follow those up.  Patient received at least 1 L normal saline bolus in the ER, and getting 125 cc/h since then.  Continue with same.

## 2023-05-03 NOTE — ED Notes (Addendum)
Dr. Bernette Mayers aware of elevated Calcium- 13.7

## 2023-05-03 NOTE — ED Notes (Signed)
Daniel Whitaker with cl called for transport

## 2023-05-03 NOTE — Assessment & Plan Note (Addendum)
These are pathologic.  Treat with pain control, as well as incentive spirometry.  Given etiology of suspected multiple myeloma including spinal lesions.  Look forward to oncology input in the morning. I discussed case with Dr. Mosetta Putt.   Patient ordered for standing acetaminophen, I will give patient 1 dose of OxyContin this evening to control his overnight pain.  Patient has as needed short acting oxycodone for moderate pain as well as morphine for severe pain ordered.  Discussed with patient as well as grandson at the bedside.  Will order GI prophylaxis to avoid constipation.

## 2023-05-03 NOTE — Progress Notes (Signed)
Hospitalist Transfer Note:    Nursing staff, Please call [facility] Admits & Consults System-Wide number on Amion [phone]) as soon as patient's arrival, so appropriate admitting provider can evaluate the pt.   Transferring facility: [facility] Requesting provider: Dr. Bernette Mayers (EDP at [facility]) Reason for transfer: admission for further evaluation and management of hypercalcemia, acute kidney injury, suspected pneumonia, multiple consecutive rib fractures.    77 year old male with history of MGUS that is recently progressed to multiple myeloma,  who presented to [facility] ED complaining of bilateral pleuritic chest discomfort over the last few days.  The patient has been experiencing progressive cough with green-colored sputum over the course the last week, with increasing frequency/intensity of this cough over that timeframe.  Over the last few days, he has developed new onset bilateral chest wall discomfort with cough.   He has a history of MGUS, that is reported to have recently progressed to multiple myeloma.  Approximately 2 weeks ago he underwent PET scan, as well as bone marrow biopsy, with results of such reported to be currently pending.  CT chest shows multiple consecutive bilateral rib fractures, as well as bibasilar airspace opacity concerning for potential pneumonia.  Labs were notable for acute kidney injury, with creatinine up to 1.8, as well as elevated calcium level of approximately 13.   Medications administered prior to transfer included the following: Azithromycin and Rocephin for suspected pneumonia, as well as IV fluids.   Subsequently, I accepted this patient for transfer for inpatient admission to a med/tele bed at [facility]  for further work-up and management of the above.      Newton Pigg, DO Hospitalist

## 2023-05-03 NOTE — H&P (Addendum)
History and Physical    Patient: Daniel Whitaker WGN:[id number] DOB: 03/12/1946 DOA: 05/02/2023 DOS: the patient was seen and examined on 05/03/2023 PCP: [name], MD  Patient coming from: Home>> ER  Chief Complaint:  Chief Complaint  Patient presents with   Chest Pain   HPI: Daniel Whitaker is a 77 y.o. male with medical history significant of MGUS and was recently (on or around 11/26) diagnosed with Multiple myeloma based on biopsy and pet scan in system. Unfortunately, patient has not had an oncology appointment since then.  Primary historian for this encounter is patient's grandson at the bedside although patient gives input at the time as well.  Patient seems to have been in his usual state of health till around November 19 or so and patient reports a new onset of back pain in the interscapular area that was bandlike going from 1 lateral side in the infra axillary area to the other infra axillary area.  Worse with certain position changes coughs or deep breaths.  Not associated with any marked spells of coughing or chest or sputum production or fever or shortness of breath.  Patient sought various remedies as an outpatient for these complaints.  X-rays were done as an outpatient and there was no pathology identified.  3 days ago on Friday, patient describes having a coughing spell followed by sudden worsening of this pain.  Which has pretty much been 10 on 10 since then.  Again the patient her grandson does not report any leg swelling fevers or any sputum production or any chronic cough or shortness of breath.  Patient finally came to the ER last evening after dinner because of persistent pain.  There is no report of trauma/falls  Workup in the ER revealed hypercalcemia, rib fractures and other workup as below.  Medical evaluation is sought.  Patient has received morphine from the ER which has helped a lot with the pain, however patient has become more restful/somnolent.  Deferring  history taking to the son.  He also has been placed on 2 L/min of supplementary oxygen.  Patient has also been having constipation for about 3 weeks Review of Systems: As mentioned in the history of present illness. All other systems reviewed and are negative. 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   Nocturnal leg cramps 03/11/2021   Nontoxic uninodular goiter    Obesity    Peripheral neuropathy    RLS (restless legs syndrome)    Spinal stenosis    Unspecified deficiency anemia 07/11/2013   Past Surgical History:  Procedure Laterality Date   CARDIAC CATHETERIZATION     COLON RESECTION N/A 04/21/2013   Procedure: DIAGNOSTIC LAPAROSCOPY, lysis of adhesions for partial small bowel obstruction;  Surgeon: Adolph Pollack, MD;  Location: [facility];  Service: General;  Laterality: N/A;   COLON SURGERY     partial SBO    COLONOSCOPY     CORONARY STENT INTERVENTION N/A 10/22/2021   Procedure: CORONARY STENT INTERVENTION;  Surgeon: Marykay Lex, MD;  Location: [facility] INVASIVE CV LAB;  Service: Cardiovascular;  Laterality: N/A;   KNEE SURGERY     Arthroscopic   LEFT  HEART CATH AND CORONARY ANGIOGRAPHY N/A 10/22/2021   Procedure: LEFT HEART CATH AND CORONARY ANGIOGRAPHY;  Surgeon: Marykay Lex, MD;  Location: [facility] INVASIVE CV LAB;  Service: Cardiovascular;  Laterality: N/A;   SMALL INTESTINE SURGERY     Blockage   Social History:  reports that he quit smoking about 38 years ago. His smoking use included cigarettes. He has never used smokeless tobacco. He reports that he does not currently use alcohol after a past usage of about 10.0 standard drinks of alcohol per week. He reports that he does not use  drugs.  Allergies  Allergen Reactions   Atorvastatin Other (See Comments)    Made the hands ACHE   Cymbalta [Duloxetine Hcl] Nausea Only and Other (See Comments)    Drowsiness, also   Penicillins Hives    Family History  Problem Relation Age of Onset   Brain cancer Mother    Lung cancer Father    Heart disease Brother        No details   Dementia Brother    Neuropathy Neg Hx    Colon cancer Neg Hx    Colon polyps Neg Hx    Esophageal cancer Neg Hx    Rectal cancer Neg Hx    Stomach cancer Neg Hx    Sleep apnea Neg Hx     Prior to Admission medications   Medication Sig Start Date End Date Taking? Authorizing Provider  albuterol (VENTOLIN HFA) 108 (90 Base) MCG/ACT inhaler Inhale 1-2 puffs into the lungs every 6 (six) hours as needed for wheezing or shortness of breath.   Yes [provider]  aspirin EC 81 MG tablet Take 1 tablet (81 mg total) by mouth daily. Swallow whole. 10/22/21  Yes Marykay Lex, MD  brimonidine [facility]) 0.2 % ophthalmic solution Place 1 drop into the left eye in the morning and at bedtime. 01/08/20  Yes [provider]  carvedilol (COREG) 3.125 MG tablet Take 1 tablet (3.125 mg total) by mouth 2 (two) times daily. 05/21/22  Yes [name], MD  cetirizine (ZYRTEC) 10 MG tablet Take 10 mg by mouth every morning.   Yes [provider]  Cholecalciferol (VITAMIN D3) 50 MCG (2000 UT) TABS Take 2,000 Units by mouth daily.   Yes [provider]  clopidogrel (PLAVIX) 75 MG tablet Take 1 tablet (75 mg total) by mouth daily. 05/20/22  Yes [name], MD  Cyanocobalamin (VITAMIN B-12) 5000 MCG TBDP Take 10,000 mcg by mouth every morning.   Yes [provider]  dexamethasone (DECADRON) 4 MG tablet Take 1 tablet (4 mg total) by mouth in the morning. Take with food 04/16/23  Yes Gorsuch, Ni, MD  docusate sodium (COLACE) 100 MG capsule Take 100-200 mg by mouth 2 (two) times daily as needed for mild constipation.    Yes [provider]  dorzolamide-timolol (COSOPT) 22.3-6.8 MG/ML ophthalmic solution Place 1 drop into both eyes 2 (two) times daily. 03/09/18  Yes [provider]  HYDROcodone-acetaminophen (NORCO/VICODIN) 5-325 MG tablet Take 1-2 tablets by mouth every 8 (eight) hours as needed for moderate pain (pain score 4-6).   Yes [provider]  latanoprost (XALATAN) 0.005 % ophthalmic solution Place 1 drop into both eyes at bedtime. 03/01/13  Yes [provider]  lidocaine (XYLOCAINE) 5 % ointment Apply 1 Application topically 2 (two) times daily as needed. Patient taking differently: Apply 1 Application topically 2 (two) times daily as needed (for foot pain). 09/29/22  Yes Butch Penny, NP  magnesium oxide (MAG-OX) 400 (240 Mg) MG tablet Take 400 mg by mouth daily.   Yes [provider]  Multiple Vitamins-Minerals (CENTRUM SILVER 50+MEN) TABS Take 1 tablet by mouth daily with breakfast.   Yes [provider]  nitroGLYCERIN (NITROSTAT) 0.4 MG SL tablet Place 1 tablet (0.4 mg total) under the tongue every 5 (five) minutes as needed. Patient taking differently: Place 0.4 mg under the tongue every 5 (five) minutes as needed for chest pain. 10/22/21  Yes Arty Baumgartner, NP  Omega-3 Fatty Acids (FISH OIL) 1200 MG CAPS Take 3,600 mg by mouth daily. 3   Yes [provider]  pantoprazole (PROTONIX) 40 MG tablet Take 40 mg by mouth daily before breakfast. 03/14/13  Yes [provider]  pregabalin (LYRICA) 100 MG capsule TAKE 1 CAPSULE BY MOUTH IN THE MORNING, AND AT NOON, AND 2 CAPSULES AT NIGHT Patient taking differently: Take 100 mg by mouth in the morning and at bedtime. 02/17/23  Yes Ocie Doyne, MD  TYLENOL 500 MG tablet Take 500-1,000 mg by mouth every 6 (six) hours as needed for mild pain (pain score 1-3) or headache.   Yes [provider]  cyclobenzaprine (FLEXERIL) 10 MG tablet Take 10 mg by mouth 3 (three) times daily as  needed for muscle spasms.    [provider]  ketoconazole (NIZORAL) 2 % cream Apply 1 application  topically daily as needed for irritation. 11/28/14   [provider]  rosuvastatin (CRESTOR) 40 MG tablet TAKE 1 TABLET BY MOUTH DAILY 04/19/23   [name], MD    Physical Exam: Vitals:   05/03/23 1213 05/03/23 1215 05/03/23 1347 05/03/23 1744  BP:  (!) 141/70 (!) 144/73 (!) 128/51  Pulse:  72 70 77  Resp:  19 18 20   Temp: 98 F (36.7 C)  98.6 F (37 C) 98.4 F (36.9 C)  TempSrc: Oral  Oral Oral  SpO2:  95% 93% 95%  Weight:      Height:       General: Overweight appearing gentleman no immediate distress on supplementary oxygen.  Restful, defers a lot of history taking to grandson [name] at the bedside.  Pain is well-controlled at this time. Respiratory exam: Chest palpation was not done to avoid unnecessary pain.  Bilateral air entry vesicular Cardiovascular exam S1-S2 normal Abdomen all quadrants soft nontender Extremities warm without edema no focal motor deficit. Patinet sat up for exam. Did participate a bit in talking. Data Reviewed:  Labs on Admission:  Results for orders placed or performed during the hospital encounter of 05/02/23 (from the past 24 hour(s))  Basic metabolic panel     Status: Abnormal   Collection Time: 05/02/23 11:21 PM  Result Value Ref Range   Sodium 140 135 - 145 mmol/L   Potassium 3.7 3.5 - 5.1 mmol/L   Chloride 99 98 - 111 mmol/L   CO2 33 (H) 22 - 32 mmol/L   Glucose, Bld 112 (H) 70 - 99 mg/dL   BUN 49 (H) 8 - 23 mg/dL   Creatinine, Ser 1.61 (H) 0.61 - 1.24 mg/dL   Calcium 09.6 (HH) 8.9 - 10.3 mg/dL   GFR, Estimated 38 (L) >60 mL/min   Anion gap 8 5 - 15  CBC with Differential     Status: Abnormal   Collection Time: 05/02/23 11:21 PM  Result Value Ref Range   WBC 11.0 (H) 4.0 - 10.5 K/uL   RBC 3.11 (L) 4.22 - 5.81 MIL/uL   Hemoglobin 8.8 (L)  13.0 - 17.0 g/dL   HCT 19.1 (L) 47.8 - 29.5 %   MCV 90.0 80.0 - 100.0 fL    MCH 28.3 26.0 - 34.0 pg   MCHC 31.4 30.0 - 36.0 g/dL   RDW 62.1 (H) 30.8 - 65.7 %   Platelets 244 150 - 400 K/uL   nRBC 0.0 0.0 - 0.2 %   Neutrophils Relative % 67 %   Neutro Abs 7.3 1.7 - 7.7 K/uL   Lymphocytes Relative 23 %   Lymphs Abs 2.6 0.7 - 4.0 K/uL   Monocytes Relative 8 %   Monocytes Absolute 0.9 0.1 - 1.0 K/uL   Eosinophils Relative 1 %   Eosinophils Absolute 0.1 0.0 - 0.5 K/uL   Basophils Relative 0 %   Basophils Absolute 0.0 0.0 - 0.1 K/uL   Immature Granulocytes 1 %   Abs Immature Granulocytes 0.15 (H) 0.00 - 0.07 K/uL  Basic metabolic panel     Status: Abnormal   Collection Time: 05/03/23  6:45 AM  Result Value Ref Range   Sodium 140 135 - 145 mmol/L   Potassium 3.8 3.5 - 5.1 mmol/L   Chloride 103 98 - 111 mmol/L   CO2 29 22 - 32 mmol/L   Glucose, Bld 93 70 - 99 mg/dL   BUN 43 (H) 8 - 23 mg/dL   Creatinine, Ser 8.46 (H) 0.61 - 1.24 mg/dL   Calcium 96.2 (HH) 8.9 - 10.3 mg/dL   GFR, Estimated 41 (L) >60 mL/min   Anion gap 8 5 - 15  CBC     Status: Abnormal   Collection Time: 05/03/23  4:22 PM  Result Value Ref Range   WBC 9.2 4.0 - 10.5 K/uL   RBC 2.94 (L) 4.22 - 5.81 MIL/uL   Hemoglobin 8.4 (L) 13.0 - 17.0 g/dL   HCT 95.2 (L) 84.1 - 32.4 %   MCV 93.9 80.0 - 100.0 fL   MCH 28.6 26.0 - 34.0 pg   MCHC 30.4 30.0 - 36.0 g/dL   RDW 40.1 (H) 02.7 - 25.3 %   Platelets 205 150 - 400 K/uL   nRBC 0.0 0.0 - 0.2 %  Comprehensive metabolic panel     Status: Abnormal   Collection Time: 05/03/23  4:22 PM  Result Value Ref Range   Sodium 137 135 - 145 mmol/L   Potassium 3.3 (L) 3.5 - 5.1 mmol/L   Chloride 107 98 - 111 mmol/L   CO2 26 22 - 32 mmol/L   Glucose, Bld 110 (H) 70 - 99 mg/dL   BUN 40 (H) 8 - 23 mg/dL   Creatinine, Ser 6.64 (H) 0.61 - 1.24 mg/dL   Calcium 40.3 (H) 8.9 - 10.3 mg/dL   Total Protein 7.9 6.5 - 8.1 g/dL   Albumin 2.5 (L) 3.5 - 5.0 g/dL   AST 14 (L) 15 - 41 U/L   ALT 15 0 - 44 U/L   Alkaline Phosphatase 67 38 - 126 U/L   Total Bilirubin 0.4  <1.2 mg/dL   GFR, Estimated 43 (L) >60 mL/min   Anion gap 4 (L) 5 - 15  Phosphorus     Status: None   Collection Time: 05/03/23  4:22 PM  Result Value Ref Range   Phosphorus 4.3 2.5 - 4.6 mg/dL  Protime-INR     Status: Abnormal   Collection Time: 05/03/23  4:22 PM  Result Value Ref Range   Prothrombin Time 16.1 (H) 11.4 - 15.2 seconds   INR 1.3 (H) 0.8 - 1.2  APTT     Status: None   Collection Time: 05/03/23  4:22 PM  Result Value Ref Range   aPTT 33 24 - 36 seconds   Basic Metabolic Panel: Recent Labs  Lab 05/02/23 2321 05/03/23 0645 05/03/23 1622  NA 140 140 137  K 3.7 3.8 3.3*  CL 99 103 107  CO2 33* 29 26  GLUCOSE 112* 93 110*  BUN 49* 43* 40*  CREATININE 1.83* 1.70* 1.65*  CALCIUM 13.7* 13.1* 12.1*  PHOS  --   --  4.3   Liver Function Tests: Recent Labs  Lab 05/03/23 1622  AST 14*  ALT 15  ALKPHOS 67  BILITOT 0.4  PROT 7.9  ALBUMIN 2.5*   No results for input(s): "LIPASE", "AMYLASE" in the last 168 hours. No results for input(s): "AMMONIA" in the last 168 hours. CBC: Recent Labs  Lab 04/27/23 0728 05/02/23 2321 05/03/23 1622  WBC 11.4* 11.0* 9.2  NEUTROABS  --  7.3  --   HGB 9.3* 8.8* 8.4*  HCT 29.0* 28.0* 27.6*  MCV 89.2 90.0 93.9  PLT 274 244 205   Cardiac Enzymes: No results for input(s): "CKTOTAL", "CKMB", "CKMBINDEX", "TROPONINIHS" in the last 168 hours.  BNP (last 3 results) No results for input(s): "PROBNP" in the last 8760 hours. CBG: No results for input(s): "GLUCAP" in the last 168 hours.  Radiological Exams on Admission:  CT Chest W Contrast  Result Date: 05/03/2023 CLINICAL DATA:  Pneumonia with complication suspected. X-ray done. Cough with rib pain after coughing. Shortness of breath and right-sided pain. History of multiple myeloma. EXAM: CT CHEST WITH CONTRAST TECHNIQUE: Multidetector CT imaging of the chest was performed during intravenous contrast administration. RADIATION DOSE REDUCTION: This exam was performed according  to the departmental dose-optimization program which includes automated exposure control, adjustment of the mA and/or kV according to patient size and/or use of iterative reconstruction technique. CONTRAST:  60mL OMNIPAQUE IOHEXOL 300 MG/ML  SOLN COMPARISON:  Chest radiograph 05/02/2023.  PET-CT 04/22/2023 FINDINGS: Cardiovascular: Mild cardiac enlargement. No pericardial effusions. Normal caliber thoracic aorta. Calcification of the aorta and coronary arteries. Mediastinum/Nodes: Thyroid gland is unremarkable. Esophagus is decompressed. Scattered mediastinal lymph nodes are not pathologically enlarged. Lungs/Pleura: Motion artifact limits examination. There is evidence of atelectasis or consolidation in both lung bases. Small bilateral pleural effusions. Changes could represent pneumonia. No pneumothorax. Upper Abdomen: No acute abnormalities. Musculoskeletal: Degenerative changes in the spine and shoulders. Multiple lucent lesions demonstrated in the ribs, spine, and shoulders consistent with history of myeloma. Fractures of the right lateral fifth, sixth, and seventh ribs as well as the right tenth rib. Fractures of the left third, fourth, and sixth ribs. No vertebral compression deformities are demonstrated although large lucent lesions are demonstrated in the T1 and T2 vertebrae. IMPRESSION: 1. Multiple lucent lesions demonstrated in the ribs, spine, and shoulders corresponding to the patient's history of multiple myeloma. 2. Multiple bilateral acute rib fractures. 3. Small bilateral pleural effusions with basilar atelectasis or consolidation, possibly pneumonia. Electronically Signed   By: Burman Nieves M.D.   On: 05/03/2023 01:21   DG Chest Port 1 View  Result Date: 05/02/2023 CLINICAL DATA:  Shortness of breath and cough. History of multiple myeloma. Right-sided pain. EXAM: PORTABLE CHEST 1 VIEW COMPARISON:  04/20/2023 FINDINGS: Shallow inspiration. Cardiac enlargement. No vascular congestion or edema.  Linear atelectasis in the lung bases. No pleural effusions. No pneumothorax. Calcification of the aorta. A nondisplaced right lateral ninth rib fracture is suggested. IMPRESSION: Shallow inspiration with atelectasis in  the lung bases. Cardiac enlargement. Probable right ninth rib fracture. Electronically Signed   By: Burman Nieves M.D.   On: 05/02/2023 23:45    EKG: Independently reviewed. From this AM, NSR. No ST T wave schanges.   Assessment and Plan: * Hypercalcemia Symptomatic with constipation.  Likely due to multiple myeloma as well as pathologic fractures.  At this time pending ionized calcium.  However it is at least moderately severe at the time of presentation.  Has improved somewhat with IV hydration.  Patient's mental status is felt to be normal and at this time somnolence is felt to be due to morphine use.  Hold off on Vitamin D3.  Patient is not a candidate for calcitonin at this time due to improvement of calcium.  Patient will need IV pamidronic acid to control calcium.  Patient is pending PTH and vitamin D level, follow those up.  Patient received at least 1 L normal saline bolus in the ER, and getting 125 cc/h since then.  Continue with same.  CAP (community acquired pneumonia) Compromised patient.  This is largely radiologic diagnosis, although patient does have leukocytosis.  Patient already started on ceftriaxone and azithromycin at outside ER starting yesterday.  I will continue with same.  I am not sure patient has any associated hypoxemia and patient is currently on supplemental oxygen after getting morphine.  There are 2 sets of blood cultures in the system pending.  Please follow-up the same.  Rib fractures These are pathologic.  Treat with pain control, as well as incentive spirometry.  Given etiology of suspected multiple myeloma including spinal lesions.  Look forward to oncology input in the morning. I discussed case with Dr. Mosetta Putt.   Patient ordered for standing  acetaminophen, I will give patient 1 dose of OxyContin this evening to control his overnight pain.  Patient has as needed short acting oxycodone for moderate pain as well as morphine for severe pain ordered.  Discussed with patient as well as grandson at the bedside.  Will order GI prophylaxis to avoid constipation.  AKI (acute kidney injury) (HCC) The hypercalcemia this is felt to be prerenal.  Continue with IV fluids as ordered.  Check urinalysis sodium creatinine intake output and daily weight.   Please see HPI.  Has history of CAD with stent placement approximately 2 years ago.  Continue with Coreg, aspirin, Plavix, statin.  Pressure is good at this time  Patient has diagnosis of peripheral neuropathy due to his MGUS.  Continue with Lyrica as well as topical Xylocaine.  Advance Care Planning:   Code Status: Full Code   Consults: Dr. Mosetta Putt services. Thank you.  Family Communication: [name] at bedside. Detailed discussion held.  Severity of Illness: The appropriate patient status for this patient is INPATIENT. Inpatient status is judged to be reasonable and necessary in order to provide the required intensity of service to ensure the patient's safety. The patient's presenting symptoms, physical exam findings, and initial radiographic and laboratory data in the context of their chronic comorbidities is felt to place them at high risk for further clinical deterioration. Furthermore, it is not anticipated that the patient will be medically stable for discharge from the hospital within 2 midnights of admission.   * I certify that at the point of admission it is my clinical judgment that the patient will require inpatient hospital care spanning beyond 2 midnights from the point of admission due to high intensity of service, high risk for further deterioration and high frequency of surveillance  required.*  Author: Nolberto Hanlon, MD 05/03/2023 6:09 PM  For on call review [email or web address].

## 2023-05-03 NOTE — Assessment & Plan Note (Signed)
Compromised patient.  This is largely radiologic diagnosis, although patient does have leukocytosis.  Patient already started on ceftriaxone and azithromycin at outside ER starting yesterday.  I will continue with same.  I am not sure patient has any associated hypoxemia and patient is currently on supplemental oxygen after getting morphine.  There are 2 sets of blood cultures in the system pending.  Please follow-up the same.

## 2023-05-03 NOTE — ED Notes (Signed)
Patient reports pain medication helped, but still has sharp pain to right lateral chest with movement. Pain 0/10 with rest, pain 6/10 with movement.

## 2023-05-03 NOTE — Assessment & Plan Note (Signed)
The hypercalcemia this is felt to be prerenal.  Continue with IV fluids as ordered.  Check urinalysis sodium creatinine intake output and daily weight.

## 2023-05-04 ENCOUNTER — Inpatient Hospital Stay: Payer: Medicare Other | Admitting: Hematology and Oncology

## 2023-05-04 ENCOUNTER — Encounter: Payer: Self-pay | Admitting: Hematology and Oncology

## 2023-05-04 ENCOUNTER — Inpatient Hospital Stay (HOSPITAL_COMMUNITY): Payer: Medicare Other

## 2023-05-04 LAB — CBC WITH DIFFERENTIAL/PLATELET
Abs Immature Granulocytes: 0.05 10*3/uL (ref 0.00–0.07)
Basophils Absolute: 0 10*3/uL (ref 0.0–0.1)
Basophils Relative: 0 %
Eosinophils Absolute: 0 10*3/uL (ref 0.0–0.5)
Eosinophils Relative: 0 %
HCT: 26.4 % — ABNORMAL LOW (ref 39.0–52.0)
Hemoglobin: 8 g/dL — ABNORMAL LOW (ref 13.0–17.0)
Immature Granulocytes: 1 %
Lymphocytes Relative: 6 %
Lymphs Abs: 0.4 10*3/uL — ABNORMAL LOW (ref 0.7–4.0)
MCH: 28.4 pg (ref 26.0–34.0)
MCHC: 30.3 g/dL (ref 30.0–36.0)
MCV: 93.6 fL (ref 80.0–100.0)
Monocytes Absolute: 0.2 10*3/uL (ref 0.1–1.0)
Monocytes Relative: 2 %
Neutro Abs: 6.3 10*3/uL (ref 1.7–7.7)
Neutrophils Relative %: 91 %
Platelets: 186 10*3/uL (ref 150–400)
RBC: 2.82 MIL/uL — ABNORMAL LOW (ref 4.22–5.81)
RDW: 15.6 % — ABNORMAL HIGH (ref 11.5–15.5)
WBC: 7 10*3/uL (ref 4.0–10.5)
nRBC: 0 % (ref 0.0–0.2)

## 2023-05-04 LAB — URINALYSIS, COMPLETE (UACMP) WITH MICROSCOPIC
Bacteria, UA: NONE SEEN
Bilirubin Urine: NEGATIVE
Glucose, UA: NEGATIVE mg/dL
Ketones, ur: NEGATIVE mg/dL
Leukocytes,Ua: NEGATIVE
Nitrite: NEGATIVE
Protein, ur: NEGATIVE mg/dL
Specific Gravity, Urine: 1.011 (ref 1.005–1.030)
pH: 5 (ref 5.0–8.0)

## 2023-05-04 LAB — CREATININE, URINE, RANDOM: Creatinine, Urine: 54 mg/dL

## 2023-05-04 LAB — CBC
HCT: 24.9 % — ABNORMAL LOW (ref 39.0–52.0)
Hemoglobin: 7.7 g/dL — ABNORMAL LOW (ref 13.0–17.0)
MCH: 28.5 pg (ref 26.0–34.0)
MCHC: 30.9 g/dL (ref 30.0–36.0)
MCV: 92.2 fL (ref 80.0–100.0)
Platelets: 182 10*3/uL (ref 150–400)
RBC: 2.7 MIL/uL — ABNORMAL LOW (ref 4.22–5.81)
RDW: 16 % — ABNORMAL HIGH (ref 11.5–15.5)
WBC: 6.8 10*3/uL (ref 4.0–10.5)
nRBC: 0 % (ref 0.0–0.2)

## 2023-05-04 LAB — RENAL FUNCTION PANEL
Albumin: 2.4 g/dL — ABNORMAL LOW (ref 3.5–5.0)
Anion gap: 8 (ref 5–15)
BUN: 40 mg/dL — ABNORMAL HIGH (ref 8–23)
CO2: 24 mmol/L (ref 22–32)
Calcium: 12.1 mg/dL — ABNORMAL HIGH (ref 8.9–10.3)
Chloride: 105 mmol/L (ref 98–111)
Creatinine, Ser: 2.25 mg/dL — ABNORMAL HIGH (ref 0.61–1.24)
GFR, Estimated: 29 mL/min — ABNORMAL LOW (ref >60)
Glucose, Bld: 172 mg/dL — ABNORMAL HIGH (ref 70–99)
Phosphorus: 3.9 mg/dL (ref 2.5–4.6)
Potassium: 3.6 mmol/L (ref 3.5–5.1)
Sodium: 137 mmol/L (ref 135–145)

## 2023-05-04 LAB — BASIC METABOLIC PANEL
Anion gap: 7 (ref 5–15)
BUN: 38 mg/dL — ABNORMAL HIGH (ref 8–23)
CO2: 26 mmol/L (ref 22–32)
Calcium: 12.4 mg/dL — ABNORMAL HIGH (ref 8.9–10.3)
Chloride: 107 mmol/L (ref 98–111)
Creatinine, Ser: 1.58 mg/dL — ABNORMAL HIGH (ref 0.61–1.24)
GFR, Estimated: 45 mL/min — ABNORMAL LOW (ref >60)
Glucose, Bld: 87 mg/dL (ref 70–99)
Potassium: 3.5 mmol/L (ref 3.5–5.1)
Sodium: 140 mmol/L (ref 135–145)

## 2023-05-04 LAB — CALCIUM, IONIZED: Calcium, Ionized, Serum: 7.4 mg/dL — ABNORMAL HIGH (ref 4.5–5.6)

## 2023-05-04 LAB — PARATHYROID HORMONE, INTACT (NO CA): PTH: 3 pg/mL — ABNORMAL LOW (ref 15–65)

## 2023-05-04 LAB — SODIUM, URINE, RANDOM: Sodium, Ur: 75 mmol/L

## 2023-05-04 LAB — ABO/RH: ABO/RH(D): O POS

## 2023-05-04 MED ORDER — POLYETHYLENE GLYCOL 3350 17 G PO PACK
17.0000 g | PACK | Freq: Every day | ORAL | Status: DC
  Administered 2023-05-04 – 2023-05-07 (×4): 17 g via ORAL
  Filled 2023-05-04 (×4): qty 1

## 2023-05-04 MED ORDER — SODIUM CHLORIDE 0.9 % IV SOLN: INTRAVENOUS | Status: AC

## 2023-05-04 MED ORDER — CALCITONIN (SALMON) 200 UNIT/ACT NA SOLN
1.0000 | Freq: Every day | NASAL | Status: DC
  Administered 2023-05-04 – 2023-05-07 (×4): 1 via NASAL
  Filled 2023-05-04: qty 3.7

## 2023-05-04 MED ORDER — LACTULOSE 10 GM/15ML PO SOLN
20.0000 g | Freq: Two times a day (BID) | ORAL | Status: DC
  Administered 2023-05-04 – 2023-05-07 (×5): 20 g via ORAL
  Filled 2023-05-04 (×5): qty 30

## 2023-05-04 MED ORDER — FENTANYL CITRATE PF 50 MCG/ML IJ SOSY: 12.5000 ug | PREFILLED_SYRINGE | INTRAMUSCULAR | Status: DC | PRN

## 2023-05-04 MED ORDER — PREGABALIN 75 MG PO CAPS
75.0000 mg | ORAL_CAPSULE | Freq: Two times a day (BID) | ORAL | Status: DC
  Administered 2023-05-04 – 2023-05-07 (×6): 75 mg via ORAL
  Filled 2023-05-04 (×6): qty 1

## 2023-05-04 MED ORDER — OXYCODONE HCL ER 10 MG PO T12A
10.0000 mg | EXTENDED_RELEASE_TABLET | Freq: Two times a day (BID) | ORAL | Status: DC
Start: 2023-05-04 — End: 2023-05-07
  Administered 2023-05-04 – 2023-05-06 (×4): 10 mg via ORAL
  Filled 2023-05-04 (×5): qty 1

## 2023-05-04 MED ORDER — OXYCODONE HCL ER 10 MG PO T12A
10.0000 mg | EXTENDED_RELEASE_TABLET | Freq: Once | ORAL | Status: DC
  Filled 2023-05-04: qty 1

## 2023-05-04 MED ORDER — ACETAMINOPHEN 500 MG PO TABS
500.0000 mg | ORAL_TABLET | Freq: Four times a day (QID) | ORAL | Status: DC
  Administered 2023-05-04 – 2023-05-06 (×8): 500 mg via ORAL
  Filled 2023-05-04 (×8): qty 1

## 2023-05-04 MED ORDER — AZITHROMYCIN 250 MG PO TABS
500.0000 mg | ORAL_TABLET | Freq: Every day | ORAL | Status: DC
  Administered 2023-05-04: 500 mg via ORAL
  Filled 2023-05-04: qty 2

## 2023-05-04 MED ORDER — DEXAMETHASONE 4 MG PO TABS
20.0000 mg | ORAL_TABLET | Freq: Once | ORAL | Status: AC
  Administered 2023-05-04: 20 mg via ORAL
  Filled 2023-05-04: qty 5

## 2023-05-04 MED ORDER — CYCLOBENZAPRINE HCL 5 MG PO TABS: 5.0000 mg | ORAL_TABLET | Freq: Three times a day (TID) | ORAL | Status: DC | PRN

## 2023-05-04 NOTE — Evaluation (Addendum)
Physical Therapy Evaluation Patient Details Name: Daniel Whitaker MRN: [id number] DOB: [birth year] Today's Date: 05/04/2023  History of Present Illness  77 y.o. male admitted with multiple B pathologic rib fractures, CAP, hypercalcemia. Pt with medical history significant of MGUS and was recently (on or around 11/26) diagnosed with Multiple myeloma based on biopsy and pet scan in system.  Clinical Impression  Pt admitted with above diagnosis. Pt ambulated 140' with RW, no loss of balance, SpO2 87% on room air, no dyspnea. Pt denied pain with all mobility. At baseline pt is independent with mobility and ADLs.  Pt currently with functional limitations due to the deficits listed below (see PT Problem List). Pt will benefit from acute skilled PT to increase their independence and safety with mobility to allow discharge.           If plan is discharge home, recommend the following: A little help with walking and/or transfers;A little help with bathing/dressing/bathroom;Assistance with cooking/housework;Help with stairs or ramp for entrance;Assist for transportation   Can travel by private vehicle        Equipment Recommendations Rolling walker (2 wheels)  Recommendations for Other Services       Functional Status Assessment Patient has had a recent decline in their functional status and demonstrates the ability to make significant improvements in function in a reasonable and predictable amount of time.     Precautions / Restrictions Precautions Precautions: Fall; monitor O2 Restrictions Weight Bearing Restrictions: No      Mobility  Bed Mobility Overal bed mobility: Modified Independent             General bed mobility comments: HOB up, used rail    Transfers Overall transfer level: Needs assistance Equipment used: Rolling walker (2 wheels) Transfers: Sit to/from Stand Sit to Stand: Contact guard assist           General transfer comment: VCs hand placement     Ambulation/Gait Ambulation/Gait assistance: Contact guard assist Gait Distance (Feet): 140 Feet Assistive device: Rolling walker (2 wheels) Gait Pattern/deviations: Step-through pattern, Decreased step length - right, Decreased step length - left, Trunk flexed Gait velocity: decr     General Gait Details: VCs for posture, SpO2 87% on room air walking, no dyspnea; distance limited by fatigue, some BLE tremoring at end of ambulation  Stairs            Wheelchair Mobility     Tilt Bed    Modified Rankin (Stroke Patients Only)       Balance Overall balance assessment: Modified Independent                                           Pertinent Vitals/Pain Pain Assessment Pain Assessment: Faces Pain Score: 0-No pain Faces Pain Scale: No hurt    Home Living Family/patient expects to be discharged to:: Private residence Living Arrangements: Spouse/significant other Available Help at Discharge: Family;Available 24 hours/day   Home Access: Stairs to enter   Entrance Stairs-Number of Steps: 15 Alternate Level Stairs-Number of Steps: flight Home Layout: Two level;Bed/bath upstairs Home Equipment: None Additional Comments: could DC to child's home which has fewer stairs    Prior Function Prior Level of Function : Independent/Modified Independent;Driving             Mobility Comments: was playing gold ADLs Comments: independent     Extremity/Trunk Assessment   Upper Extremity  Assessment Upper Extremity Assessment: Generalized weakness    Lower Extremity Assessment Lower Extremity Assessment: Generalized weakness    Cervical / Trunk Assessment Cervical / Trunk Assessment: Normal  Communication   Communication Communication: No apparent difficulties  Cognition Arousal: Lethargic Behavior During Therapy: WFL for tasks assessed/performed Overall Cognitive Status: Impaired/Different from baseline                                  General Comments: increased time to respond to questions/commands; mild confusion, grandson provided hx        General Comments      Exercises     Assessment/Plan    PT Assessment Patient needs continued PT services  PT Problem List Decreased activity tolerance;Cardiopulmonary status limiting activity       PT Treatment Interventions Therapeutic exercise;Therapeutic activities;Gait training;Balance training;Patient/family education    PT Goals (Current goals can be found in the Care Plan section)  Acute Rehab PT Goals Patient Stated Goal: likes to play golf PT Goal Formulation: With patient/family Time For Goal Achievement: 05/18/23 Potential to Achieve Goals: Good    Frequency Min 1X/week     Co-evaluation               AM-PAC PT "6 Clicks" Mobility  Outcome Measure Help needed turning from your back to your side while in a flat bed without using bedrails?: None Help needed moving from lying on your back to sitting on the side of a flat bed without using bedrails?: A Little Help needed moving to and from a bed to a chair (including a wheelchair)?: A Little Help needed standing up from a chair using your arms (e.g., wheelchair or bedside chair)?: A Little Help needed to walk in hospital room?: A Little Help needed climbing 3-5 steps with a railing? : A Little 6 Click Score: 19    End of Session Equipment Utilized During Treatment: Gait belt Activity Tolerance: Patient limited by fatigue Patient left: in chair;with chair alarm set;with call bell/phone within reach;with family/visitor present Nurse Communication: Mobility status PT Visit Diagnosis: Difficulty in walking, not elsewhere classified (R26.2)    Time: 5621-3086 PT Time Calculation (min) (ACUTE ONLY): 34 min   Charges:   PT Evaluation $PT Eval Moderate Complexity: 1 Mod PT Treatments $Gait Training: 8-22 mins PT General Charges $$ ACUTE PT VISIT: 1 Visit        Tamala Ser PT 05/04/2023  Acute Rehabilitation Services  Office [phone]

## 2023-05-04 NOTE — Progress Notes (Signed)
   05/04/23 2345  BiPAP/CPAP/SIPAP  BiPAP/CPAP/SIPAP Pt Type Adult  Mask Type Nasal pillows  Respiratory Rate 18 breaths/min  Patient Home Equipment Yes  Auto Titrate No

## 2023-05-04 NOTE — Progress Notes (Signed)
[facility] Progress Note Patient: Daniel Whitaker OZD:[id number] DOB: April 16, 1946 DOA: 05/02/2023  DOS: the patient was seen and examined on 05/04/2023  Brief hospital course: PMH of MGUS now multiple myeloma, CAD, neuropathy,, obesity, OSA on CPAP, HLD present to the hospital with complaints of chest pain and fatigue. Found to have AKI secondary to hypercalcemia of malignancy as well as multiple rib fractures, atelectasis versus community-acquired pneumonia.  Also has severe constipation.  Assessment and Plan: Hypercalcemia of malignancy. Associated with multiple myeloma. PTH is actually low appropriately. Hematology following.  Appreciate assistance. Patient has received pamidronate based on his renal function which is appropriate. Currently also receiving IV Lasix fluids. Serum creatinine initially improved but now trending up. Will get ultrasound renal and continue with aggressive IV hydration. At risk for volume overload and will require IV Lasix later on. Also receiving calcitonin, receiving nasal spray. May require nephrology input if does not improve with above measures.  Acute kidney injury. Mostly in the setting of multiple myeloma. Will check ultrasound renal. Aggressive hydration. Urine analysis was unremarkable. Monitor.  Community-acquired pneumonia versus atelectasis. Patient has rib fracture therefore unable to take deep breaths. Most likely this is atelectasis per No report of fever or infection. Was started on IV antibiotic. Will continue for now but if negative workup in next 48 hours would recommend to discontinue them.  Multiple rib fractures. Presented with chest pain.  Had a CT chest with contrast. Has fractures of right lateral fifth sixth and seventh rib as well as 10th rib. Also lesions involving T1 and T2 vertebra. Lesion seen bilateral shoulders as well consistent with multiple myeloma. Continue pain control. Given his mild encephalopathy  would prefer to use as needed medication rather than scheduled medication.  Chronic neuropathy. Patient does take Lyrica 100 mg twice daily at home. Does have some asterixis as well as myoclonic jerks at the time of my evaluation. Would reduce the dose of Lyrica to 75 mg twice daily.  Severe constipation. Was started on Senokot as well as MiraLAX. Given there is no improvement would add lactulose. Monitor.  Coronary artery disease. Cath May 2023, DES PCI to LAD. On aspirin and Plavix, was supposed to be for 1 year, still continuing.  HLD. On statin. Continue.  HTN. On Coreg 3.125 mg twice daily which continue.  Glaucoma. Continue eyedrops.  Deconditioning. Secondary to pain and multiple rib fractures. PT OT consulted.  Obesity. OSA. Uses CPAP at night. Continue. Body mass index is 31.38 kg/m.  Placing the patient at high risk of poor outcome.  Anemia.  In the setting of multiple myeloma. Transfuse for hemoglobin less than 7 or hemodynamic instability.  Subjective: Reports severe pain.  Somewhat drowsy at the time of my evaluation.  Has some myoclonic jerks. No nausea no vomiting.  No fever no chills.  Reports constipation but passing gas.  No fall no trauma no injury reported.  Physical Exam: General: in moderate distress, No Rash Cardiovascular: S1 and S2 Present, No Murmur Respiratory: Increased respiratory effort, Bilateral Air entry present.  Basal crackles, No wheezes Abdomen: Bowel Sound present, No tenderness Extremities: trace edema Neuro: drowsy and oriented x3, no new focal deficit  Data Reviewed: I have Reviewed nursing notes, Vitals, and Lab results. Since last encounter, pertinent lab results CBC and CMP   . I have ordered test including CBC and CMP  . I have discussed pt's care plan and test results with hematology  . I have ordered imaging ultrasound renal  .  Disposition: Status is: Inpatient Remains inpatient appropriate because: Monitor  for improvement in renal function as well as calcium level.  SCDs Start: 05/03/23 1547   Family Communication: Multiple family members at bedside. Level of care: Telemetry continue telemetry. Vitals:   05/04/23 0151 05/04/23 0958 05/04/23 1304 05/04/23 1329  BP: (!) 128/59 (!) 145/58  (!) 159/70  Pulse: 67 69  85  Resp: 14   20  Temp: 98 F (36.7 C)   98 F (36.7 C)  TempSrc: Oral   Oral  SpO2: 95%  (!) 87% 92%  Weight: 107.9 kg     Height:         Author: Lynden Oxford, MD 05/04/2023 7:00 PM  Please look on [email or web address] to find out who is on call.

## 2023-05-04 NOTE — Plan of Care (Signed)

## 2023-05-04 NOTE — Progress Notes (Signed)
MARIANNE OW   DOB:[birth year]   GM#:[id number]    ASSESSMENT & PLAN:  Multiple myeloma PET/CT imaging and recent bone marrow biopsy confirmed the diagnosis The patient is admitted for malignant hypercalcemia We discussed the importance of getting his calcium level and renal function improved before we start him on treatment I will return tomorrow to discuss chemotherapy options with him and his family  Malignant hypercalcemia Due to untreated multiple myeloma I will start him on high-dose dexamethasone today We will continue IV fluids He has received pamidronate I will also start him on calcitonin  Severe anemia Multifactorial, component of renal failure, bone marrow disease and hemodilution I will recheck his blood count again and if his start trending down further, we will provide blood transfusion support  Acute renal failure Due to dehydration, hypercalcemia and diagnosis of multiple myeloma I encouraged the patient to increase oral fluid intake as tolerated  Cancer associated pain He will continue current prescribed OxyContin and oxycodone as needed  Severe constipation Likely exacerbated by pain and hypercalcemia We discussed importance of aggressive laxative therapy  Goals of care Resolution of severe hypercalcemia  Discharge planning Unknown, he will likely be here for at least 3 to 5 days  All questions were answered. The patient knows to call the clinic with any problems, questions or concerns.   The total time spent in the appointment was 60 minutes encounter with patients including review of chart and various tests results, discussions about plan of care and coordination of care plan  [name], MD 05/04/2023 7:45 AM  Subjective:  Patient is well-known to me.  He was supposed to be seen later today to discuss results of his bone marrow biopsy but is currently admitted due to malignant hypercalcemia.  His wife and family members are present by the bedside.   The patient is not fully oriented but according to family members, his mental status has improved dramatically since admission.  He has received aggressive IV fluid support, better pain regimen as well as IV pamidronate last night.  He has been complaining of severe rib pain despite being on steroid therapy.  He has severe constipation for at least a week I spent quite a bit of time discussing plan of care with the family members today  Objective:  Vitals:   05/03/23 2138 05/04/23 0151  BP: 136/63 (!) 128/59  Pulse: 72 67  Resp: 14 14  Temp: 98.2 F (36.8 C) 98 F (36.7 C)  SpO2: 94% 95%     Intake/Output Summary (Last 24 hours) at 05/04/2023 0745 Last data filed at 05/04/2023 0316 Gross per 24 hour  Intake 1626.21 ml  Output 1350 ml  Net 276.21 ml    GENERAL:alert, no distress and comfortable NEURO: alert & oriented x 2 with fluent speech, no focal motor/sensory deficits   Labs:  Recent Labs    09/16/22 0805 04/12/23 0908 05/02/23 2321 05/03/23 0645 05/03/23 1622 05/04/23 0557  NA 141 139   < > 140 137 140  K 4.4 3.9   < > 3.8 3.3* 3.5  CL 106 105   < > 103 107 107  CO2 28 26   < > 29 26 26   GLUCOSE 106* 91   < > 93 110* 87  BUN 18 17   < > 43* 40* 38*  CREATININE 0.88 0.99   < > 1.70* 1.65* 1.58*  CALCIUM 9.9 9.8   < > 13.1* 12.1* 12.4*  GFRNONAA >60 >60   < >  41* 43* 45*  PROT 7.2 8.2*  --   --  7.9  --   ALBUMIN 4.1 3.6  --   --  2.5*  --   AST 21 14*  --   --  14*  --   ALT 17 11  --   --  15  --   ALKPHOS 92 87  --   --  67  --   BILITOT 0.3 0.4  --   --  0.4  --    < > = values in this interval not displayed.    Studies: I personally reviewed his PET/CT imaging NM PET Image Initial (PI) Whole Body (F-18 FDG)  Result Date: 05/03/2023 CLINICAL DATA:  Subsequent treatment strategy for multiple myeloma. EXAM: NUCLEAR MEDICINE PET WHOLE BODY TECHNIQUE: 13 mCi F-18 FDG was injected intravenously. Full-ring PET imaging was performed from the head to foot after  the radiotracer. CT data was obtained and used for attenuation correction and anatomic localization. Fasting blood glucose: 114 mg/dl COMPARISON:  None Available. FINDINGS: Mediastinal blood pool activity: SUV max 2.0 HEAD/NECK: No hypermetabolic activity in the scalp. No hypermetabolic cervical lymph nodes. Incidental CT findings: Is CHEST: No hypermetabolic mediastinal or hilar nodes. No suspicious pulmonary nodules on the CT scan. Incidental CT findings: none ABDOMEN/PELVIS: No abnormal hypermetabolic activity within the liver, pancreas, adrenal glands, or spleen. No hypermetabolic lymph nodes in the abdomen or pelvis. Incidental CT findings: none SKELETON: There multiple foci focal radiotracer activity within the skeleton. Lesions are accompaniedlytic change on CT portion exam. There approximately 50 lesions. For example lesion the posterior RIGHT fifth rib with SUV max equal 3.6 on image 120. There is a lucent lesion and pathologic fracture associated with hypermetabolic activity the posterior LEFT sixth rib with SUV max equal 6.4 on image 129. Multiple lesions throughout the spine. Most intense lesion spinal lesion has associated soft tissue expansion at the the T2 vertebral body measuring 2.5 cm (107) with SUV max equal 7.3 Lesion in the inferior LEFT pubic rami subtle lucency on image 299. Suspicious lesions in LEFT and RIGHT femoral necks. Incidental CT findings: none EXTREMITIES: Hypermetabolic lesions RIGHT humeral shaft. Lesion the proximal LEFT and RIGHT femurs. Incidental CT findings: none IMPRESSION: 1. Multiple hypermetabolic lytic lesions throughout the skeleton consistent with multiple myeloma. Approximately 50 lesions within the axillary and appendicular skeleton. Many lesions accompanied lytic/lucent lesions on CT. 2. No evidence of soft tissue plasmacytoma. Electronically Signed   By: Genevive Bi M.D.   On: 05/03/2023 15:16   CT Chest W Contrast  Result Date: 05/03/2023 CLINICAL DATA:   Pneumonia with complication suspected. X-ray done. Cough with rib pain after coughing. Shortness of breath and right-sided pain. History of multiple myeloma. EXAM: CT CHEST WITH CONTRAST TECHNIQUE: Multidetector CT imaging of the chest was performed during intravenous contrast administration. RADIATION DOSE REDUCTION: This exam was performed according to the departmental dose-optimization program which includes automated exposure control, adjustment of the mA and/or kV according to patient size and/or use of iterative reconstruction technique. CONTRAST:  60mL OMNIPAQUE IOHEXOL 300 MG/ML  SOLN COMPARISON:  Chest radiograph 05/02/2023.  PET-CT 04/22/2023 FINDINGS: Cardiovascular: Mild cardiac enlargement. No pericardial effusions. Normal caliber thoracic aorta. Calcification of the aorta and coronary arteries. Mediastinum/Nodes: Thyroid gland is unremarkable. Esophagus is decompressed. Scattered mediastinal lymph nodes are not pathologically enlarged. Lungs/Pleura: Motion artifact limits examination. There is evidence of atelectasis or consolidation in both lung bases. Small bilateral pleural effusions. Changes could represent pneumonia. No pneumothorax. Upper Abdomen:  No acute abnormalities. Musculoskeletal: Degenerative changes in the spine and shoulders. Multiple lucent lesions demonstrated in the ribs, spine, and shoulders consistent with history of myeloma. Fractures of the right lateral fifth, sixth, and seventh ribs as well as the right tenth rib. Fractures of the left third, fourth, and sixth ribs. No vertebral compression deformities are demonstrated although large lucent lesions are demonstrated in the T1 and T2 vertebrae. IMPRESSION: 1. Multiple lucent lesions demonstrated in the ribs, spine, and shoulders corresponding to the patient's history of multiple myeloma. 2. Multiple bilateral acute rib fractures. 3. Small bilateral pleural effusions with basilar atelectasis or consolidation, possibly pneumonia.  Electronically Signed   By: Burman Nieves M.D.   On: 05/03/2023 01:21   DG Chest Port 1 View  Result Date: 05/02/2023 CLINICAL DATA:  Shortness of breath and cough. History of multiple myeloma. Right-sided pain. EXAM: PORTABLE CHEST 1 VIEW COMPARISON:  04/20/2023 FINDINGS: Shallow inspiration. Cardiac enlargement. No vascular congestion or edema. Linear atelectasis in the lung bases. No pleural effusions. No pneumothorax. Calcification of the aorta. A nondisplaced right lateral ninth rib fracture is suggested. IMPRESSION: Shallow inspiration with atelectasis in the lung bases. Cardiac enlargement. Probable right ninth rib fracture. Electronically Signed   By: Burman Nieves M.D.   On: 05/02/2023 23:45   CT BONE MARROW BIOPSY & ASPIRATION  Result Date: 04/27/2023 INDICATION: myeloma staging EXAM: CT GUIDED BONE MARROW ASPIRATION AND CORE BIOPSY MEDICATIONS: None. ANESTHESIA/SEDATION: Moderate (conscious) sedation was employed during this procedure. A total of Versed 2 mg and Fentanyl 100 mcg was administered intravenously. Moderate Sedation Time: 13 minutes. The patient's level of consciousness and vital signs were monitored continuously by radiology nursing throughout the procedure under my direct supervision. FLUOROSCOPY TIME:  CT dose; 122 mGycm COMPLICATIONS: None immediate. Estimated blood loss: <5 mL PROCEDURE: RADIATION DOSE REDUCTION: This exam was performed according to the departmental dose-optimization program which includes automated exposure control, adjustment of the mA and/or kV according to patient size and/or use of iterative reconstruction technique. Informed written consent was obtained from the patient after a thorough discussion of the procedural risks, benefits and alternatives. All questions were addressed. Maximal Sterile Barrier Technique was utilized including caps, mask, sterile gowns, sterile gloves, sterile drape, hand hygiene and skin antiseptic. A timeout was performed  prior to the initiation of the procedure. The patient was positioned prone and non-contrast localization CT was performed of the pelvis to demonstrate the iliac marrow spaces. Maximal barrier sterile technique utilized including caps, mask, sterile gowns, sterile gloves, large sterile drape, hand hygiene, and chlorhexidine prep. Under sterile conditions and local anesthesia, an 11 gauge coaxial bone biopsy needle was advanced into the RIGHT iliac marrow space. Needle position was confirmed with CT imaging. Initially, bone marrow aspiration was performed. Next, the 11 gauge outer cannula was utilized to obtain a 1 iliac bone marrow core biopsy. Needle was removed. Hemostasis was obtained with compression. The patient tolerated the procedure well. Samples were prepared with the cytotechnologist. IMPRESSION: Successful CT-guided bone marrow aspiration and biopsy. Roanna Banning, MD [facility] Electronically Signed   By: Roanna Banning M.D.   On: 04/27/2023 12:32   DG Chest 2 View  Result Date: 04/23/2023 CLINICAL DATA:  Cough for 2 months Pain between shoulder blades EXAM: CHEST - 2 VIEW COMPARISON:  08/24/2019 FINDINGS: Cardiomediastinal silhouette and pulmonary vasculature are within normal limits. Lungs are hyperexpanded but otherwise clear. Unchanged dextroconvex curvature of the thoracolumbar spine. IMPRESSION: 1. No acute cardiopulmonary process.  2. Hyperexpanded lungs, suspicious for emphysema. Electronically Signed   By: Acquanetta Belling M.D.   On: 04/23/2023 16:44

## 2023-05-05 ENCOUNTER — Other Ambulatory Visit: Payer: Self-pay | Admitting: Hematology and Oncology

## 2023-05-05 DIAGNOSIS — S2243XA Multiple fractures of ribs, bilateral, initial encounter for closed fracture: Secondary | ICD-10-CM

## 2023-05-05 DIAGNOSIS — N179 Acute kidney failure, unspecified: Secondary | ICD-10-CM | POA: Diagnosis not present

## 2023-05-05 DIAGNOSIS — J9811 Atelectasis: Secondary | ICD-10-CM

## 2023-05-05 LAB — CBC WITH DIFFERENTIAL/PLATELET
Abs Immature Granulocytes: 0.08 10*3/uL — ABNORMAL HIGH (ref 0.00–0.07)
Abs Immature Granulocytes: 0.09 10*3/uL — ABNORMAL HIGH (ref 0.00–0.07)
Basophils Absolute: 0 10*3/uL (ref 0.0–0.1)
Basophils Absolute: 0 10*3/uL (ref 0.0–0.1)
Basophils Relative: 0 %
Basophils Relative: 0 %
Eosinophils Absolute: 0 10*3/uL (ref 0.0–0.5)
Eosinophils Absolute: 0 10*3/uL (ref 0.0–0.5)
Eosinophils Relative: 0 %
Eosinophils Relative: 0 %
HCT: 25.1 % — ABNORMAL LOW (ref 39.0–52.0)
HCT: 23.6 % — ABNORMAL LOW (ref 39.0–52.0)
Hemoglobin: 7.9 g/dL — ABNORMAL LOW (ref 13.0–17.0)
Hemoglobin: 7.5 g/dL — ABNORMAL LOW (ref 13.0–17.0)
Immature Granulocytes: 1 %
Immature Granulocytes: 1 %
Lymphocytes Relative: 6 %
Lymphocytes Relative: 6 %
Lymphs Abs: 0.5 10*3/uL — ABNORMAL LOW (ref 0.7–4.0)
Lymphs Abs: 0.7 10*3/uL (ref 0.7–4.0)
MCH: 28.6 pg (ref 26.0–34.0)
MCH: 28.8 pg (ref 26.0–34.0)
MCHC: 31.5 g/dL (ref 30.0–36.0)
MCHC: 31.8 g/dL (ref 30.0–36.0)
MCV: 90.9 fL (ref 80.0–100.0)
MCV: 90.8 fL (ref 80.0–100.0)
Monocytes Absolute: 0.4 10*3/uL (ref 0.1–1.0)
Monocytes Absolute: 0.7 10*3/uL (ref 0.1–1.0)
Monocytes Relative: 4 %
Monocytes Relative: 6 %
Neutro Abs: 7.5 10*3/uL (ref 1.7–7.7)
Neutro Abs: 10 10*3/uL — ABNORMAL HIGH (ref 1.7–7.7)
Neutrophils Relative %: 89 %
Neutrophils Relative %: 87 %
Platelets: 188 10*3/uL (ref 150–400)
Platelets: 170 10*3/uL (ref 150–400)
RBC: 2.76 MIL/uL — ABNORMAL LOW (ref 4.22–5.81)
RBC: 2.6 MIL/uL — ABNORMAL LOW (ref 4.22–5.81)
RDW: 15.5 % (ref 11.5–15.5)
RDW: 15.3 % (ref 11.5–15.5)
WBC: 8.4 10*3/uL (ref 4.0–10.5)
WBC: 11.5 10*3/uL — ABNORMAL HIGH (ref 4.0–10.5)
nRBC: 0 % (ref 0.0–0.2)
nRBC: 0 % (ref 0.0–0.2)

## 2023-05-05 LAB — RENAL FUNCTION PANEL
Albumin: 2.4 g/dL — ABNORMAL LOW (ref 3.5–5.0)
Albumin: 2.3 g/dL — ABNORMAL LOW (ref 3.5–5.0)
Anion gap: 12 (ref 5–15)
Anion gap: 11 (ref 5–15)
BUN: 54 mg/dL — ABNORMAL HIGH (ref 8–23)
BUN: 58 mg/dL — ABNORMAL HIGH (ref 8–23)
CO2: 21 mmol/L — ABNORMAL LOW (ref 22–32)
CO2: 22 mmol/L (ref 22–32)
Calcium: 12.1 mg/dL — ABNORMAL HIGH (ref 8.9–10.3)
Calcium: 11.5 mg/dL — ABNORMAL HIGH (ref 8.9–10.3)
Chloride: 109 mmol/L (ref 98–111)
Chloride: 111 mmol/L (ref 98–111)
Creatinine, Ser: 2.79 mg/dL — ABNORMAL HIGH (ref 0.61–1.24)
Creatinine, Ser: 2.74 mg/dL — ABNORMAL HIGH (ref 0.61–1.24)
GFR, Estimated: 23 mL/min — ABNORMAL LOW (ref >60)
GFR, Estimated: 23 mL/min — ABNORMAL LOW (ref >60)
Glucose, Bld: 168 mg/dL — ABNORMAL HIGH (ref 70–99)
Glucose, Bld: 124 mg/dL — ABNORMAL HIGH (ref 70–99)
Phosphorus: 4.5 mg/dL (ref 2.5–4.6)
Phosphorus: 3.8 mg/dL (ref 2.5–4.6)
Potassium: 3.4 mmol/L — ABNORMAL LOW (ref 3.5–5.1)
Potassium: 3.6 mmol/L (ref 3.5–5.1)
Sodium: 142 mmol/L (ref 135–145)
Sodium: 144 mmol/L (ref 135–145)

## 2023-05-05 LAB — MAGNESIUM: Magnesium: 1.9 mg/dL (ref 1.7–2.4)

## 2023-05-05 LAB — URIC ACID: Uric Acid, Serum: 10.8 mg/dL — ABNORMAL HIGH (ref 3.7–8.6)

## 2023-05-05 MED ORDER — SODIUM CHLORIDE 0.9 % IV SOLN: INTRAVENOUS | Status: DC

## 2023-05-05 MED ORDER — RASBURICASE 1.5 MG IV SOLR
3.0000 mg | Freq: Once | INTRAVENOUS | Status: AC
  Administered 2023-05-05: 3 mg via INTRAVENOUS
  Filled 2023-05-05: qty 2

## 2023-05-05 NOTE — Plan of Care (Signed)

## 2023-05-05 NOTE — Progress Notes (Signed)
Mobility Specialist - Progress Note   05/05/23 1535  Mobility  Activity Ambulated with assistance in hallway  Level of Assistance Standby assist, set-up cues, supervision of patient - no hands on  Assistive Device Front wheel walker  Distance Ambulated (ft) 240 ft  Range of Motion/Exercises Active  Activity Response Tolerated well  Mobility Referral Yes  $Mobility charge 1 Mobility  Mobility Specialist Start Time (ACUTE ONLY) 1511  Mobility Specialist Stop Time (ACUTE ONLY) 1523  Mobility Specialist Time Calculation (min) (ACUTE ONLY) 12 min   Received in bed and agreed to mobility. Had no issues throughout session. Returned to bed with all needs met.  Marilynne Halsted Mobility Specialist

## 2023-05-05 NOTE — Progress Notes (Signed)
Daniel Whitaker   DOB:19-Dec-1945   ZO#:[id number]    ASSESSMENT & PLAN:  Multiple myeloma PET/CT imaging and recent bone marrow biopsy confirmed the diagnosis The patient is admitted for malignant hypercalcemia We discussed the importance of getting his calcium level and renal function improved before we start him on treatment I spent over 90 minutes with the patient and family, reviewing current guidelines, treatment options and role of stem cell transplant down the road After long discussion, the patient is in agreement to go on aggressive approach with combination of daratumumab, lenalidomide, bortezomib and dexamethasone We discussed restriction of prescribing different regimen while hospitalized The only regimen that I could prescribe while he is here is bortezomib but again, I would prefer to see improvement of his renal function and uric acid level before starting him on treatment Due to his baseline peripheral neuropathy, he will need upfront dose reduction of bortezomib We will get prescription authorization for lenalidomide I will reserve and not give him another pulse dose of dexamethasone until improvement of his uric acid and renal function  Malignant hypercalcemia Due to untreated multiple myeloma He received 1 dose of pamidronate on December 2 He received high-dose pulse dexamethasone 20 mg p.o. on December 3 He was started on nasal calcitonin on December 3 and we will continue We will continue IV fluids We will add furosemide in the future once we see improvement of his renal function   Severe anemia Multifactorial, component of renal failure, bone marrow disease and hemodilution I will recheck his blood count again and if his start trending down further, we will provide blood transfusion support He does not need blood transfusion support today   Acute renal failure Due to dehydration, hypercalcemia and diagnosis of multiple myeloma and possible tumor lysis syndrome due  to high level of uric acid after a dose of pulse dexamethasone I encouraged the patient to increase oral fluid intake as tolerated Will increase IV fluids today I also recommend 1 dose of rasburicase and we will follow on uric acid level daily for the next 3 days   Cancer associated pain He will continue current prescribed OxyContin and oxycodone as needed   Severe constipation Likely exacerbated by pain and hypercalcemia We discussed importance of aggressive laxative therapy   Goals of care Resolution of severe hypercalcemia   Discharge planning Unknown, he will likely be here for at least 3 to 5 days   All questions were answered. The patient knows to call the clinic with any problems, questions or concerns.   The total time spent in the appointment was 100 minutes encounter with patients including review of chart and various tests results, discussions about plan of care and coordination of care plan  [name], MD 05/05/2023 12:48 PM  Subjective:  The patient felt great this morning.  He has no pain whatsoever.  Continue to be somewhat constipated although he has passage of flatus.  He has great appetite and was able to walk down the hallway.  I spent over 90 minutes discussing various treatment options with the patient and family today  Objective:  Vitals:   05/05/23 0643 05/05/23 1131  BP: (!) 162/72 (!) 150/73  Pulse: 66 66  Resp: 16   Temp: (!) 97.4 F (36.3 C)   SpO2: 95%      Intake/Output Summary (Last 24 hours) at 05/05/2023 1248 Last data filed at 05/05/2023 9811 Gross per 24 hour  Intake 2570.66 ml  Output 450 ml  Net 2120.66 ml  GENERAL:alert, no distress and comfortable  NEURO: alert & oriented x 3 with fluent speech, no focal motor/sensory deficits   Labs:  Recent Labs    09/16/22 0805 04/12/23 0908 05/02/23 2321 05/03/23 1622 05/05/2023 0557 05-May-2023 1713 05/05/23 0557  NA 141 139   < > 137 140 137 142  K 4.4 3.9   < > 3.3* 3.5 3.6 3.4*   CL 106 105   < > 107 107 105 109  CO2 28 26   < > 26 26 24  21*  GLUCOSE 106* 91   < > 110* 87 172* 168*  BUN 18 17   < > 40* 38* 40* 54*  CREATININE 0.88 0.99   < > 1.65* 1.58* 2.25* 2.79*  CALCIUM 9.9 9.8   < > 12.1* 12.4* 12.1* 12.1*  GFRNONAA >60 >60   < > 43* 45* 29* 23*  PROT 7.2 8.2*  --  7.9  --   --   --   ALBUMIN 4.1 3.6  --  2.5*  --  2.4* 2.4*  AST 21 14*  --  14*  --   --   --   ALT 17 11  --  15  --   --   --   ALKPHOS 92 87  --  67  --   --   --   BILITOT 0.3 0.4  --  0.4  --   --   --    < > = values in this interval not displayed.    Studies:  US RENAL  Result Date: May 05, 2023 CLINICAL DATA:  Acute kidney injury EXAM: RENAL / URINARY TRACT ULTRASOUND COMPLETE COMPARISON:  PET-CT 04/22/2023 FINDINGS: Right Kidney: Renal measurements: 14.1 x 6 x 6.7 cm = volume: 301 mL. Echogenicity within normal limits. No mass or hydronephrosis visualized. Left Kidney: Renal measurements: 14.1 x 6 x 6.6 cm = volume: 288 mL. Echogenicity within normal limits. No mass or hydronephrosis visualized. 4 mm echogenic focus demonstrated in the lower pole consistent with a stone. Bladder: Appears normal for degree of bladder distention. Other: None. IMPRESSION: 1. 4 mm nonobstructing stone in the lower pole left kidney. 2. No hydronephrosis or solid mass in either kidney. Electronically Signed   By: Burman Nieves M.D.   On: 05/05/23 23:49   NM PET Image Initial (PI) Whole Body (F-18 FDG)  Result Date: 05/03/2023 CLINICAL DATA:  Subsequent treatment strategy for multiple myeloma. EXAM: NUCLEAR MEDICINE PET WHOLE BODY TECHNIQUE: 13 mCi F-18 FDG was injected intravenously. Full-ring PET imaging was performed from the head to foot after the radiotracer. CT data was obtained and used for attenuation correction and anatomic localization. Fasting blood glucose: 114 mg/dl COMPARISON:  None Available. FINDINGS: Mediastinal blood pool activity: SUV max 2.0 HEAD/NECK: No hypermetabolic activity in the  scalp. No hypermetabolic cervical lymph nodes. Incidental CT findings: Is CHEST: No hypermetabolic mediastinal or hilar nodes. No suspicious pulmonary nodules on the CT scan. Incidental CT findings: none ABDOMEN/PELVIS: No abnormal hypermetabolic activity within the liver, pancreas, adrenal glands, or spleen. No hypermetabolic lymph nodes in the abdomen or pelvis. Incidental CT findings: none SKELETON: There multiple foci focal radiotracer activity within the skeleton. Lesions are accompaniedlytic change on CT portion exam. There approximately 50 lesions. For example lesion the posterior RIGHT fifth rib with SUV max equal 3.6 on image 120. There is a lucent lesion and pathologic fracture associated with hypermetabolic activity the posterior LEFT sixth rib with SUV max equal 6.4 on image 129. Multiple lesions  throughout the spine. Most intense lesion spinal lesion has associated soft tissue expansion at the the T2 vertebral body measuring 2.5 cm (107) with SUV max equal 7.3 Lesion in the inferior LEFT pubic rami subtle lucency on image 299. Suspicious lesions in LEFT and RIGHT femoral necks. Incidental CT findings: none EXTREMITIES: Hypermetabolic lesions RIGHT humeral shaft. Lesion the proximal LEFT and RIGHT femurs. Incidental CT findings: none IMPRESSION: 1. Multiple hypermetabolic lytic lesions throughout the skeleton consistent with multiple myeloma. Approximately 50 lesions within the axillary and appendicular skeleton. Many lesions accompanied lytic/lucent lesions on CT. 2. No evidence of soft tissue plasmacytoma. Electronically Signed   By: Genevive Bi M.D.   On: 05/03/2023 15:16   CT Chest W Contrast  Result Date: 05/03/2023 CLINICAL DATA:  Pneumonia with complication suspected. X-ray done. Cough with rib pain after coughing. Shortness of breath and right-sided pain. History of multiple myeloma. EXAM: CT CHEST WITH CONTRAST TECHNIQUE: Multidetector CT imaging of the chest was performed during  intravenous contrast administration. RADIATION DOSE REDUCTION: This exam was performed according to the departmental dose-optimization program which includes automated exposure control, adjustment of the mA and/or kV according to patient size and/or use of iterative reconstruction technique. CONTRAST:  60mL OMNIPAQUE IOHEXOL 300 MG/ML  SOLN COMPARISON:  Chest radiograph 05/02/2023.  PET-CT 04/22/2023 FINDINGS: Cardiovascular: Mild cardiac enlargement. No pericardial effusions. Normal caliber thoracic aorta. Calcification of the aorta and coronary arteries. Mediastinum/Nodes: Thyroid gland is unremarkable. Esophagus is decompressed. Scattered mediastinal lymph nodes are not pathologically enlarged. Lungs/Pleura: Motion artifact limits examination. There is evidence of atelectasis or consolidation in both lung bases. Small bilateral pleural effusions. Changes could represent pneumonia. No pneumothorax. Upper Abdomen: No acute abnormalities. Musculoskeletal: Degenerative changes in the spine and shoulders. Multiple lucent lesions demonstrated in the ribs, spine, and shoulders consistent with history of myeloma. Fractures of the right lateral fifth, sixth, and seventh ribs as well as the right tenth rib. Fractures of the left third, fourth, and sixth ribs. No vertebral compression deformities are demonstrated although large lucent lesions are demonstrated in the T1 and T2 vertebrae. IMPRESSION: 1. Multiple lucent lesions demonstrated in the ribs, spine, and shoulders corresponding to the patient's history of multiple myeloma. 2. Multiple bilateral acute rib fractures. 3. Small bilateral pleural effusions with basilar atelectasis or consolidation, possibly pneumonia. Electronically Signed   By: Burman Nieves M.D.   On: 05/03/2023 01:21   DG Chest Port 1 View  Result Date: 05/02/2023 CLINICAL DATA:  Shortness of breath and cough. History of multiple myeloma. Right-sided pain. EXAM: PORTABLE CHEST 1 VIEW COMPARISON:   04/20/2023 FINDINGS: Shallow inspiration. Cardiac enlargement. No vascular congestion or edema. Linear atelectasis in the lung bases. No pleural effusions. No pneumothorax. Calcification of the aorta. A nondisplaced right lateral ninth rib fracture is suggested. IMPRESSION: Shallow inspiration with atelectasis in the lung bases. Cardiac enlargement. Probable right ninth rib fracture. Electronically Signed   By: Burman Nieves M.D.   On: 05/02/2023 23:45   CT BONE MARROW BIOPSY & ASPIRATION  Result Date: 04/27/2023 INDICATION: myeloma staging EXAM: CT GUIDED BONE MARROW ASPIRATION AND CORE BIOPSY MEDICATIONS: None. ANESTHESIA/SEDATION: Moderate (conscious) sedation was employed during this procedure. A total of Versed 2 mg and Fentanyl 100 mcg was administered intravenously. Moderate Sedation Time: 13 minutes. The patient's level of consciousness and vital signs were monitored continuously by radiology nursing throughout the procedure under my direct supervision. FLUOROSCOPY TIME:  CT dose; 122 mGycm COMPLICATIONS: None immediate. Estimated blood loss: <5 mL PROCEDURE: RADIATION DOSE REDUCTION:  This exam was performed according to the departmental dose-optimization program which includes automated exposure control, adjustment of the mA and/or kV according to patient size and/or use of iterative reconstruction technique. Informed written consent was obtained from the patient after a thorough discussion of the procedural risks, benefits and alternatives. All questions were addressed. Maximal Sterile Barrier Technique was utilized including caps, mask, sterile gowns, sterile gloves, sterile drape, hand hygiene and skin antiseptic. A timeout was performed prior to the initiation of the procedure. The patient was positioned prone and non-contrast localization CT was performed of the pelvis to demonstrate the iliac marrow spaces. Maximal barrier sterile technique utilized including caps, mask, sterile gowns,  sterile gloves, large sterile drape, hand hygiene, and chlorhexidine prep. Under sterile conditions and local anesthesia, an 11 gauge coaxial bone biopsy needle was advanced into the RIGHT iliac marrow space. Needle position was confirmed with CT imaging. Initially, bone marrow aspiration was performed. Next, the 11 gauge outer cannula was utilized to obtain a 1 iliac bone marrow core biopsy. Needle was removed. Hemostasis was obtained with compression. The patient tolerated the procedure well. Samples were prepared with the cytotechnologist. IMPRESSION: Successful CT-guided bone marrow aspiration and biopsy. Roanna Banning, MD [facility] Electronically Signed   By: Roanna Banning M.D.   On: 04/27/2023 12:32   DG Chest 2 View  Result Date: 04/23/2023 CLINICAL DATA:  Cough for 2 months Pain between shoulder blades EXAM: CHEST - 2 VIEW COMPARISON:  08/24/2019 FINDINGS: Cardiomediastinal silhouette and pulmonary vasculature are within normal limits. Lungs are hyperexpanded but otherwise clear. Unchanged dextroconvex curvature of the thoracolumbar spine. IMPRESSION: 1. No acute cardiopulmonary process. 2. Hyperexpanded lungs, suspicious for emphysema. Electronically Signed   By: Acquanetta Belling M.D.   On: 04/23/2023 16:44

## 2023-05-05 NOTE — Evaluation (Signed)
Occupational Therapy Evaluation Patient Details Name: Daniel Whitaker MRN: [id number] DOB: 02-18-1946 Today's Date: 05/05/2023   History of Present Illness Patient is a 77 year old male who presented with three day history of 10/10 pain after coughing spell. Patient was admitted with hypercalcemia, and rib fractures. PMH: recent diagnosis of multiple myeloma, MGUS, cataracts, glaucoma, obesity, RLS, spinal stenosis, neuropathy.   Clinical Impression   Patient is a 77 year old male who was admitted for above. Patient was living at home independently prior level. Currently, patient is CGA for standing at sink with one LOB noted with no BUE support with shakiness in BLE. Patient increasingly unsteady with increased time on feet. Patients O2 was 94% on RA during session patient did not have nasal cannula in place upon entrance to room today. Patient was noted to have decreased functional activity tolerance, decreased endurance, decreased standing balance, decreased safety awareness, and decreased knowledge of AD/AE impacting participation in ADLs. Plan is for patient to d/c home with family support and [facility] services.  Patient would continue to benefit from skilled OT services at this time while admitted and after d/c to address noted deficits in order to improve overall safety and independence in ADLs.        If plan is discharge home, recommend the following: A little help with walking and/or transfers;Assistance with cooking/housework;Direct supervision/assist for medications management;Assist for transportation;Help with stairs or ramp for entrance;Direct supervision/assist for financial management;A little help with bathing/dressing/bathroom    Functional Status Assessment  Patient has had a recent decline in their functional status and demonstrates the ability to make significant improvements in function in a reasonable and predictable amount of time.  Equipment Recommendations  None recommended  by OT       Precautions / Restrictions Precautions Precautions: Fall Precaution Comments: monitor O2 Restrictions Weight Bearing Restrictions: No      Mobility Bed Mobility Overal bed mobility: Modified Independent                      Balance Overall balance assessment: Needs assistance Sitting-balance support: Feet supported Sitting balance-Leahy Scale: Fair     Standing balance support: Single extremity supported, Reliant on assistive device for balance, During functional activity Standing balance-Leahy Scale: Poor Standing balance comment: one LOB with no UE support.       ADL either performed or assessed with clinical judgement   ADL Overall ADL's : Needs assistance/impaired Eating/Feeding: Modified independent;Sitting   Grooming: Wash/dry face;Oral care;Standing;Contact guard assist Grooming Details (indicate cue type and reason): noted to have incresed shakiness in BLE with increased time standing at sink. patient had one LOB with need for BUE ssupport on RW with intial standing with no UE support to wring out wash cloth to wash face at sink. Upper Body Bathing: Set up;Sitting   Lower Body Bathing: Moderate assistance;Sitting/lateral leans Lower Body Bathing Details (indicate cue type and reason): able to figure four BLE sitting EOB with simulated task. Upper Body Dressing : Set up;Sitting   Lower Body Dressing: Moderate assistance;Sitting/lateral leans   Toilet Transfer: Contact guard assist;Ambulation;Rolling walker (2 wheels)   Toileting- Clothing Manipulation and Hygiene: Sit to/from stand;Moderate assistance                            Pertinent Vitals/Pain Pain Assessment Pain Assessment: Faces Faces Pain Scale: No hurt     Extremity/Trunk Assessment Upper Extremity Assessment Upper Extremity Assessment: Overall WFL for  tasks assessed (MMT not tests with lesions noted in shoulders, rib fractures ect. able to use BUE functionally to  push up from bed and use RW.)   Lower Extremity Assessment Lower Extremity Assessment: Defer to PT evaluation   Cervical / Trunk Assessment Cervical / Trunk Assessment: Normal      Cognition Arousal: Alert Behavior During Therapy: Flat affect Overall Cognitive Status: Difficult to assess       General Comments: increased time to respond to questions. wife was quick to answer for patient. unable to complete formal cog assessment at this time with multiple family members arriving for discussion with MD this AM.                Home Living Family/patient expects to be discharged to:: Private residence Living Arrangements: Spouse/significant other Available Help at Discharge: Family;Available 24 hours/day Type of Home: House Home Access: Stairs to enter [employer] of Steps: 4   Home Layout: Two level;Bed/bath upstairs Alternate Level Stairs-Number of Steps: flight             Home Equipment: None   Additional Comments: could DC to child's home which has fewer stairs. daughter has 3 steps to enter house then one level      Prior Functioning/Environment Prior Level of Function : Independent/Modified Independent;Driving               ADLs Comments: independent        OT Problem List: Decreased activity tolerance;Impaired balance (sitting and/or standing);Decreased coordination;Decreased safety awareness;Decreased knowledge of precautions;Decreased knowledge of use of DME or AE      OT Treatment/Interventions: Self-care/ADL training;Therapeutic exercise;DME and/or AE instruction;Therapeutic activities;Patient/family education;Balance training    OT Goals(Current goals can be found in the care plan section) Acute Rehab OT Goals Patient Stated Goal: none stated OT Goal Formulation: With patient Time For Goal Achievement: 05/19/23 Potential to Achieve Goals: Fair  OT Frequency: Min 1X/week       AM-PAC OT "6 Clicks" Daily Activity     Outcome  Measure Help from another person eating meals?: A Little Help from another person taking care of personal grooming?: A Little Help from another person toileting, which includes using toliet, bedpan, or urinal?: A Lot Help from another person bathing (including washing, rinsing, drying)?: A Lot Help from another person to put on and taking off regular upper body clothing?: A Little Help from another person to put on and taking off regular lower body clothing?: A Lot 6 Click Score: 15   End of Session Equipment Utilized During Treatment: Gait belt;Rolling walker (2 wheels) Nurse Communication: Other (comment) (patients lack of nasal cannula upon entrance to room and O2 during session)  Activity Tolerance: Patient tolerated treatment well Patient left: in chair;with call bell/phone within reach;with family/visitor present  OT Visit Diagnosis: Unsteadiness on feet (R26.81);Other abnormalities of gait and mobility (R26.89);Muscle weakness (generalized) (M62.81)                Time: 7425-9563 OT Time Calculation (min): 18 min Charges:  OT General Charges $OT Visit: 1 Visit OT Evaluation $OT Eval Low Complexity: 1 Low  Michaella Imai OTR/L, MS Acute Rehabilitation Department Office# [phone]   Selinda Flavin 05/05/2023, 10:37 AM

## 2023-05-05 NOTE — Progress Notes (Signed)
rasburicase (ELITEK) 3 mg in sodium chloride 0.9 % 48 mL IVPB   Medication administered at 1810. Unable to edit in [facility]. Comment made in [facility] and MD aware.

## 2023-05-05 NOTE — Hospital Course (Addendum)
Daniel Whitaker is a 77 yo male with PMH MM, CAD, neuropathy, obesity, OSA on CPAP, HLD who presented with chest pain and fatigue. On workup he was found to have AKI, hypercalcemia.  He was started on treatment and admitted for further workup and monitoring.  Assessment and Plan:  Hypercalcemia of malignancy Multiple myeloma -Treated with fluids, Lasix, calcitonin, and pamidronate -Rasburicase added per oncology on 12/5 x 1 dose -Corrected calcium 11 mg/dL today, showing improvement; calcitonin continued at discharge to finish bottle used during hospitalization; labs to be checked at outpatient follow-up with oncology   Acute kidney injury - Mostly in the setting of multiple myeloma -Renal ultrasound shows 4 mm nonobstructing stone in lower pole left kidney, no hydronephrosis -Stable renal function, creatinine 2.79 at discharge  Normocytic anemia - suspected in setting of MM as well -Received 2 units PRBC during hospitalization - Hemoglobin 9.2 g/dL at discharge (after completing PRBC)   Atelectasis Acute hypoxia - CT chest noting multiple bilateral acute rib fractures along with lucent lesions involving ribs, spine, and shoulders -CT also noting basilar atelectasis -Encourage incentive spirometer and ambulation -No indication for antibiotics at this time; procalcitonin negative and reassuring -Still having ongoing oxygen desaturation with ambulating.  Arranging patient for home oxygen at discharge.  He can continue to slowly taper off if able  Multiple rib fractures - per CT: "Fractures of the right lateral fifth, sixth, and seventh ribs as well as the right tenth rib. Fractures of the left third, fourth, and sixth ribs. No vertebral compression deformities are demonstrated although large lucent lesions are demonstrated in the T1 and T2 vertebrae."   Chronic neuropathy -continue lyrica    Severe constipation Was started on Senokot as well as MiraLAX -Modifying as necessary    Coronary artery disease. Cath May 2023, DES PCI to LAD. On aspirin and Plavix, was supposed to be for 1 year, still continuing   HLD On statin   HTN On Coreg 3.125 mg twice daily   Glaucoma Continue eyedrops   Deconditioning Secondary to pain and multiple rib fractures PT OT consulted   Obesity OSA Uses CPAP at night Continue. Body mass index is 31.38 kg/m Placing the patient at high risk of poor outcome

## 2023-05-05 NOTE — Progress Notes (Signed)
Progress Note    Daniel Whitaker   ZDG:[id number]  DOB: 1946/05/23  DOA: 05/02/2023     2 PCP: [name], MD  Initial CC: CP, fatigue   Hospital Course: Daniel Whitaker is a 77 yo male with PMH MM, CAD, neuropathy, obesity, OSA on CPAP, HLD who presented with chest pain and fatigue. On workup he was found to have AKI, hypercalcemia.  He was started on treatment and admitted for further workup and monitoring.  Assessment and Plan:  Hypercalcemia of malignancy Multiple myeloma -Treated with fluids, Lasix, calcitonin, and pamidronate -Continuing on fluids, calcitonin -Rasburicase added per oncology -Continue trending CMP   Acute kidney injury - Mostly in the setting of multiple myeloma -Renal ultrasound shows 4 mm nonobstructing stone in lower pole left kidney, no hydronephrosis -Continue fluids - Continue trending renal function   Atelectasis - CT chest noting multiple bilateral acute rib fractures along with lucent lesions involving ribs, spine, and shoulders -CT also noting basilar atelectasis -Encourage incentive spirometer and ambulation -No indication for antibiotics at this time -Check procalcitonin in a.m.  Multiple rib fractures - per CT: "Fractures of the right lateral fifth, sixth, and seventh ribs as well as the right tenth rib. Fractures of the left third, fourth, and sixth ribs. No vertebral compression deformities are demonstrated although large lucent lesions are demonstrated in the T1 and T2 vertebrae."   Chronic neuropathy -Renally adjust Lyrica   Severe constipation Was started on Senokot as well as MiraLAX -Modifying as necessary   Coronary artery disease. Cath May 2023, DES PCI to LAD. On aspirin and Plavix, was supposed to be for 1 year, still continuing.   HLD On statin   HTN On Coreg 3.125 mg twice daily   Glaucoma Continue eyedrops   Deconditioning Secondary to pain and multiple rib fractures PT OT consulted   Obesity OSA Uses  CPAP at night Continue. Body mass index is 31.38 kg/m Placing the patient at high risk of poor outcome   Anemia.  In the setting of multiple myeloma Transfuse for hemoglobin less than 7 or hemodynamic instability  Interval History:  Sitting on edge of bed this morning with family present bedside.  They state he has only appeared very mildly confused/agitated but otherwise has been in his normal state.  No other symptoms consistent with hypercalcemia at this time.   Old records reviewed in assessment of this patient  Antimicrobials:   DVT prophylaxis:  SCDs Start: 05/03/23 1547   Code Status:   Code Status: Full Code  Mobility Assessment (Last 72 Hours)     Mobility Assessment     Row Name 05/05/23 1035 05/05/23 0800 05/04/23 2031 05/04/23 1311 05/04/23 1125   Does patient have an order for bedrest or is patient medically unstable -- No - Continue assessment No - Continue assessment -- No - Continue assessment   What is the highest level of mobility based on the progressive mobility assessment? Level 5 (Walks with assist in room/hall) - Balance while stepping forward/back and can walk in room with assist - Complete Level 5 (Walks with assist in room/hall) - Balance while stepping forward/back and can walk in room with assist - Complete Level 5 (Walks with assist in room/hall) - Balance while stepping forward/back and can walk in room with assist - Complete Level 5 (Walks with assist in room/hall) - Balance while stepping forward/back and can walk in room with assist - Complete Level 5 (Walks with assist in room/hall) - Balance while stepping forward/back  and can walk in room with assist - Complete    Row Name 05/03/23 1908 05/03/23 1600         Does patient have an order for bedrest or is patient medically unstable No - Continue assessment No - Continue assessment      What is the highest level of mobility based on the progressive mobility assessment? Level 5 (Walks with assist in  room/hall) - Balance while stepping forward/back and can walk in room with assist - Complete Level 5 (Walks with assist in room/hall) - Balance while stepping forward/back and can walk in room with assist - Complete               Barriers to discharge: none Disposition Plan:  Home Status is: Inpt  Objective: Blood pressure (!) 148/68, pulse 65, temperature 97.7 F (36.5 C), temperature source Oral, resp. rate 16, height 6\' 1"  (1.854 m), weight 107.9 kg, SpO2 96%.  Examination:  Physical Exam Constitutional:      General: He is not in acute distress.    Appearance: Normal appearance.  HENT:     Head: Normocephalic and atraumatic.     Mouth/Throat:     Mouth: Mucous membranes are moist.  Eyes:     Extraocular Movements: Extraocular movements intact.  Cardiovascular:     Rate and Rhythm: Normal rate and regular rhythm.  Pulmonary:     Effort: Pulmonary effort is normal. No respiratory distress.     Breath sounds: Normal breath sounds. No wheezing.  Abdominal:     General: Bowel sounds are normal. There is no distension.     Palpations: Abdomen is soft.     Tenderness: There is no abdominal tenderness.  Musculoskeletal:        General: Swelling: trace in extremities. Normal range of motion.     Cervical back: Normal range of motion and neck supple.  Skin:    General: Skin is warm and dry.  Neurological:     General: No focal deficit present.     Mental Status: He is alert.  Psychiatric:        Mood and Affect: Mood normal.        Behavior: Behavior normal.      Consultants:  Oncology   Procedures:    Data Reviewed: Results for orders placed or performed during the hospital encounter of 05/02/23 (from the past 24 hour(s))  Magnesium     Status: None   Collection Time: 05/05/23  5:57 AM  Result Value Ref Range   Magnesium 1.9 1.7 - 2.4 mg/dL  CBC with Differential/Platelet     Status: Abnormal   Collection Time: 05/05/23  5:57 AM  Result Value Ref Range   WBC  8.4 4.0 - 10.5 K/uL   RBC 2.76 (L) 4.22 - 5.81 MIL/uL   Hemoglobin 7.9 (L) 13.0 - 17.0 g/dL   HCT 16.1 (L) 09.6 - 04.5 %   MCV 90.9 80.0 - 100.0 fL   MCH 28.6 26.0 - 34.0 pg   MCHC 31.5 30.0 - 36.0 g/dL   RDW 40.9 81.1 - 91.4 %   Platelets 188 150 - 400 K/uL   nRBC 0.0 0.0 - 0.2 %   Neutrophils Relative % 89 %   Neutro Abs 7.5 1.7 - 7.7 K/uL   Lymphocytes Relative 6 %   Lymphs Abs 0.5 (L) 0.7 - 4.0 K/uL   Monocytes Relative 4 %   Monocytes Absolute 0.4 0.1 - 1.0 K/uL   Eosinophils Relative 0 %  Eosinophils Absolute 0.0 0.0 - 0.5 K/uL   Basophils Relative 0 %   Basophils Absolute 0.0 0.0 - 0.1 K/uL   Immature Granulocytes 1 %   Abs Immature Granulocytes 0.08 (H) 0.00 - 0.07 K/uL  Renal function panel     Status: Abnormal   Collection Time: 05/05/23  5:57 AM  Result Value Ref Range   Sodium 142 135 - 145 mmol/L   Potassium 3.4 (L) 3.5 - 5.1 mmol/L   Chloride 109 98 - 111 mmol/L   CO2 21 (L) 22 - 32 mmol/L   Glucose, Bld 168 (H) 70 - 99 mg/dL   BUN 54 (H) 8 - 23 mg/dL   Creatinine, Ser 2.95 (H) 0.61 - 1.24 mg/dL   Calcium 62.1 (H) 8.9 - 10.3 mg/dL   Phosphorus 4.5 2.5 - 4.6 mg/dL   Albumin 2.4 (L) 3.5 - 5.0 g/dL   GFR, Estimated 23 (L) >60 mL/min   Anion gap 12 5 - 15  Uric acid     Status: Abnormal   Collection Time: 05/05/23  5:57 AM  Result Value Ref Range   Uric Acid, Serum 10.8 (H) 3.7 - 8.6 mg/dL  CBC with Differential/Platelet     Status: Abnormal   Collection Time: 05/05/23  5:00 PM  Result Value Ref Range   WBC 11.5 (H) 4.0 - 10.5 K/uL   RBC 2.60 (L) 4.22 - 5.81 MIL/uL   Hemoglobin 7.5 (L) 13.0 - 17.0 g/dL   HCT 30.8 (L) 65.7 - 84.6 %   MCV 90.8 80.0 - 100.0 fL   MCH 28.8 26.0 - 34.0 pg   MCHC 31.8 30.0 - 36.0 g/dL   RDW 96.2 95.2 - 84.1 %   Platelets 170 150 - 400 K/uL   nRBC 0.0 0.0 - 0.2 %   Neutrophils Relative % 87 %   Neutro Abs 10.0 (H) 1.7 - 7.7 K/uL   Lymphocytes Relative 6 %   Lymphs Abs 0.7 0.7 - 4.0 K/uL   Monocytes Relative 6 %    Monocytes Absolute 0.7 0.1 - 1.0 K/uL   Eosinophils Relative 0 %   Eosinophils Absolute 0.0 0.0 - 0.5 K/uL   Basophils Relative 0 %   Basophils Absolute 0.0 0.0 - 0.1 K/uL   Immature Granulocytes 1 %   Abs Immature Granulocytes 0.09 (H) 0.00 - 0.07 K/uL    I have reviewed pertinent nursing notes, vitals, labs, and images as necessary. I have ordered labwork to follow up on as indicated.  I have reviewed the last notes from staff over past 24 hours. I have discussed patient's care plan and test results with nursing staff, CM/SW, and other staff as appropriate.  Time spent: Greater than 50% of the 55 minute visit was spent in counseling/coordination of care for the patient as laid out in the A&P.   LOS: 2 days   Lewie Chamber, MD [facility] 05/05/2023, 6:13 PM

## 2023-05-06 ENCOUNTER — Other Ambulatory Visit (HOSPITAL_COMMUNITY): Payer: Self-pay

## 2023-05-06 ENCOUNTER — Telehealth: Payer: Self-pay | Admitting: Pharmacy Technician

## 2023-05-06 ENCOUNTER — Other Ambulatory Visit: Payer: Self-pay

## 2023-05-06 ENCOUNTER — Telehealth: Payer: Self-pay

## 2023-05-06 ENCOUNTER — Encounter: Payer: Self-pay | Admitting: Hematology and Oncology

## 2023-05-06 ENCOUNTER — Other Ambulatory Visit: Payer: Self-pay | Admitting: Hematology and Oncology

## 2023-05-06 DIAGNOSIS — C9000 Multiple myeloma not having achieved remission: Secondary | ICD-10-CM

## 2023-05-06 DIAGNOSIS — N179 Acute kidney failure, unspecified: Secondary | ICD-10-CM | POA: Diagnosis not present

## 2023-05-06 DIAGNOSIS — S2243XA Multiple fractures of ribs, bilateral, initial encounter for closed fracture: Secondary | ICD-10-CM | POA: Diagnosis not present

## 2023-05-06 LAB — CBC WITH DIFFERENTIAL/PLATELET
Abs Immature Granulocytes: 0.05 10*3/uL (ref 0.00–0.07)
Basophils Absolute: 0 10*3/uL (ref 0.0–0.1)
Basophils Relative: 0 %
Eosinophils Absolute: 0.1 10*3/uL (ref 0.0–0.5)
Eosinophils Relative: 1 %
HCT: 24.9 % — ABNORMAL LOW (ref 39.0–52.0)
Hemoglobin: 7.6 g/dL — ABNORMAL LOW (ref 13.0–17.0)
Immature Granulocytes: 1 %
Lymphocytes Relative: 15 %
Lymphs Abs: 1.5 10*3/uL (ref 0.7–4.0)
MCH: 28.4 pg (ref 26.0–34.0)
MCHC: 30.5 g/dL (ref 30.0–36.0)
MCV: 92.9 fL (ref 80.0–100.0)
Monocytes Absolute: 0.5 10*3/uL (ref 0.1–1.0)
Monocytes Relative: 5 %
Neutro Abs: 7.7 10*3/uL (ref 1.7–7.7)
Neutrophils Relative %: 78 %
Platelets: 178 10*3/uL (ref 150–400)
RBC: 2.68 MIL/uL — ABNORMAL LOW (ref 4.22–5.81)
RDW: 15.9 % — ABNORMAL HIGH (ref 11.5–15.5)
WBC: 9.8 10*3/uL (ref 4.0–10.5)
nRBC: 0 % (ref 0.0–0.2)

## 2023-05-06 LAB — COMPREHENSIVE METABOLIC PANEL
ALT: 17 U/L (ref 0–44)
AST: 20 U/L (ref 15–41)
Albumin: 2.4 g/dL — ABNORMAL LOW (ref 3.5–5.0)
Alkaline Phosphatase: 73 U/L (ref 38–126)
Anion gap: 9 (ref 5–15)
BUN: 59 mg/dL — ABNORMAL HIGH (ref 8–23)
CO2: 23 mmol/L (ref 22–32)
Calcium: 10.5 mg/dL — ABNORMAL HIGH (ref 8.9–10.3)
Chloride: 110 mmol/L (ref 98–111)
Creatinine, Ser: 2.84 mg/dL — ABNORMAL HIGH (ref 0.61–1.24)
GFR, Estimated: 22 mL/min — ABNORMAL LOW (ref >60)
Glucose, Bld: 98 mg/dL (ref 70–99)
Potassium: 3.3 mmol/L — ABNORMAL LOW (ref 3.5–5.1)
Sodium: 142 mmol/L (ref 135–145)
Total Bilirubin: 0.4 mg/dL (ref <1.2)
Total Protein: 7.6 g/dL (ref 6.5–8.1)

## 2023-05-06 LAB — URIC ACID: Uric Acid, Serum: 5.6 mg/dL (ref 3.7–8.6)

## 2023-05-06 LAB — BETA 2 MICROGLOBULIN, SERUM: Beta-2 Microglobulin: 5.6 mg/L — ABNORMAL HIGH (ref 0.6–2.4)

## 2023-05-06 LAB — PREPARE RBC (CROSSMATCH)

## 2023-05-06 LAB — MAGNESIUM: Magnesium: 2 mg/dL (ref 1.7–2.4)

## 2023-05-06 LAB — PROCALCITONIN: Procalcitonin: 0.1 ng/mL

## 2023-05-06 MED ORDER — SODIUM CHLORIDE 0.9 % IV SOLN: INTRAVENOUS | Status: DC

## 2023-05-06 MED ORDER — POTASSIUM CHLORIDE CRYS ER 20 MEQ PO TBCR
40.0000 meq | EXTENDED_RELEASE_TABLET | Freq: Once | ORAL | Status: AC
  Administered 2023-05-06: 40 meq via ORAL
  Filled 2023-05-06: qty 2

## 2023-05-06 MED ORDER — ACETAMINOPHEN 500 MG PO TABS
1000.0000 mg | ORAL_TABLET | Freq: Four times a day (QID) | ORAL | Status: DC
  Administered 2023-05-06 – 2023-05-07 (×5): 1000 mg via ORAL
  Filled 2023-05-06 (×5): qty 2

## 2023-05-06 MED ORDER — LENALIDOMIDE 10 MG PO CAPS: 10.0000 mg | ORAL_CAPSULE | Freq: Every day | ORAL | Status: DC

## 2023-05-06 MED ORDER — SODIUM CHLORIDE 0.9% IV SOLUTION: Freq: Once | INTRAVENOUS | Status: AC

## 2023-05-06 MED ORDER — LENALIDOMIDE 10 MG PO CAPS: 10.0000 mg | ORAL_CAPSULE | Freq: Every day | ORAL | 0 refills | Status: DC

## 2023-05-06 NOTE — Plan of Care (Signed)

## 2023-05-06 NOTE — Telephone Encounter (Signed)
Oral Oncology Pharmacist Encounter  Received new prescription for Revlimid (lenalidomide) for the treatment of multiple myeloma in conjunction with daratumumab, bortezomib, and dexamethasone, planned duration until disease progression or unacceptable toxicity.  Labs from 05/06/2023 assessed, CrCl is 32.9 based on todays labs of a Scr of 2.84. Prescription was dose modified appropriately by MD. Prescription dose and frequency assessed for appropriateness.  Current medication list in Epic reviewed, no significant/ relevant DDIs with Revlimid identified.   Evaluated chart and no patient barriers to medication adherence noted.   Patient agreement for treatment documented in MD note on 05/05/2023.  Prescription has been e-scribed to the [facility] for benefits analysis and approval.  Oral Oncology Clinic will continue to follow for insurance authorization, copayment issues, initial counseling and start date.  Bethel Born, PharmD Hematology/Oncology Clinical Pharmacist [facility] [phone] 05/06/2023 10:33 AM

## 2023-05-06 NOTE — Progress Notes (Signed)
Daniel Whitaker   DOB:1945-10-31   UU#:[id number]    ASSESSMENT & PLAN:  Multiple myeloma PET/CT imaging and recent bone marrow biopsy confirmed the diagnosis The patient is admitted for malignant hypercalcemia We discussed the importance of getting his calcium level and renal function improved before we start him on treatment I spent almost 60 minutes today talking to his family and coordinating his outpatient appointment next week I was able to get lenalidomide approved and hopefully we can get it delivered next week He is scheduled to receive first dose of chemotherapy in the outpatient clinic next Friday Continue supportive care  Malignant hypercalcemia, improving Due to untreated multiple myeloma He received 1 dose of pamidronate on December 2 He received high-dose pulse dexamethasone 20 mg p.o. on December 3 He was started on nasal calcitonin on December 3 and we will continue We will continue IV fluids We will add furosemide in the future once we see improvement of his renal function   Severe anemia Multifactorial, component of renal failure, bone marrow disease and hemodilution He received 1 unit of blood transfusion today Will continue to monitor closely  Acute renal failure Due to dehydration, hypercalcemia and diagnosis of multiple myeloma and possible tumor lysis syndrome due to high level of uric acid after a dose of pulse dexamethasone I encouraged the patient to increase oral fluid intake as tolerated He will continue to receive IV fluids  Hyperuricemia, improving He received 1 dose of rasburicase on May 05, 2023 and we will follow on uric acid level daily for the next 3 days   Cancer associated pain He will continue current prescribed OxyContin and oxycodone as needed   Severe constipation, resolving Likely exacerbated by pain and hypercalcemia We discussed importance of aggressive laxative therapy   Goals of care Resolution of severe hypercalcemia    Discharge planning I am encouraged to see that his calcium level is trending down although his renal function is slightly worse I am hoping he can be discharged over the next 24 to 48 hours but it all depends on his test results tomorrow I will return to check on him again tomorrow morning  All questions were answered.   The total time spent in the appointment was 55 minutes encounter with patients including review of chart and various tests results, discussions about plan of care and coordination of care plan  [name], MD 05/06/2023 3:48 PM  Subjective:  Patient is seen in the room.  He is sleeping.  His wife is by the bedside.  His grandson is on the phone.  His grandson spent the night with him last night.  The patient had a large bowel movement around 1030 last night.  He has mild intermittent pain.  He is very tired and went to take a nap after physical therapy.  He has completed blood transfusion today. I reviewed test results with the family and discussed plan of care  Objective:  Vitals:   05/06/23 1158 05/06/23 1432  BP: (!) 146/68 121/62  Pulse: 75 63  Resp:    Temp: 97.8 F (36.6 C) 97.9 F (36.6 C)  SpO2: 93% 95%     Intake/Output Summary (Last 24 hours) at 05/06/2023 1548 Last data filed at 05/06/2023 1405 Gross per 24 hour  Intake 798 ml  Output --  Net 798 ml    GENERAL:he is sleeping   Labs:  Recent Labs    04/12/23 0908 05/02/23 2321 05/03/23 1622 05/04/23 0557 05/05/23 0557 05/05/23 1700 05/06/23  0711  NA 139   < > 137   < > 142 144 142  K 3.9   < > 3.3*   < > 3.4* 3.6 3.3*  CL 105   < > 107   < > 109 111 110  CO2 26   < > 26   < > 21* 22 23  GLUCOSE 91   < > 110*   < > 168* 124* 98  BUN 17   < > 40*   < > 54* 58* 59*  CREATININE 0.99   < > 1.65*   < > 2.79* 2.74* 2.84*  CALCIUM 9.8   < > 12.1*   < > 12.1* 11.5* 10.5*  GFRNONAA >60   < > 43*   < > 23* 23* 22*  PROT 8.2*  --  7.9  --   --   --  7.6  ALBUMIN 3.6  --  2.5*   < > 2.4* 2.3* 2.4*   AST 14*  --  14*  --   --   --  20  ALT 11  --  15  --   --   --  17  ALKPHOS 87  --  67  --   --   --  73  BILITOT 0.4  --  0.4  --   --   --  0.4   < > = values in this interval not displayed.    Studies:  US RENAL  Result Date: 05/12/23 CLINICAL DATA:  Acute kidney injury EXAM: RENAL / URINARY TRACT ULTRASOUND COMPLETE COMPARISON:  PET-CT 04/22/2023 FINDINGS: Right Kidney: Renal measurements: 14.1 x 6 x 6.7 cm = volume: 301 mL. Echogenicity within normal limits. No mass or hydronephrosis visualized. Left Kidney: Renal measurements: 14.1 x 6 x 6.6 cm = volume: 288 mL. Echogenicity within normal limits. No mass or hydronephrosis visualized. 4 mm echogenic focus demonstrated in the lower pole consistent with a stone. Bladder: Appears normal for degree of bladder distention. Other: None. IMPRESSION: 1. 4 mm nonobstructing stone in the lower pole left kidney. 2. No hydronephrosis or solid mass in either kidney. Electronically Signed   By: Burman Nieves M.D.   On: 05/12/2023 23:49   NM PET Image Initial (PI) Whole Body (F-18 FDG)  Result Date: 05/03/2023 CLINICAL DATA:  Subsequent treatment strategy for multiple myeloma. EXAM: NUCLEAR MEDICINE PET WHOLE BODY TECHNIQUE: 13 mCi F-18 FDG was injected intravenously. Full-ring PET imaging was performed from the head to foot after the radiotracer. CT data was obtained and used for attenuation correction and anatomic localization. Fasting blood glucose: 114 mg/dl COMPARISON:  None Available. FINDINGS: Mediastinal blood pool activity: SUV max 2.0 HEAD/NECK: No hypermetabolic activity in the scalp. No hypermetabolic cervical lymph nodes. Incidental CT findings: Is CHEST: No hypermetabolic mediastinal or hilar nodes. No suspicious pulmonary nodules on the CT scan. Incidental CT findings: none ABDOMEN/PELVIS: No abnormal hypermetabolic activity within the liver, pancreas, adrenal glands, or spleen. No hypermetabolic lymph nodes in the abdomen or pelvis.  Incidental CT findings: none SKELETON: There multiple foci focal radiotracer activity within the skeleton. Lesions are accompaniedlytic change on CT portion exam. There approximately 50 lesions. For example lesion the posterior RIGHT fifth rib with SUV max equal 3.6 on image 120. There is a lucent lesion and pathologic fracture associated with hypermetabolic activity the posterior LEFT sixth rib with SUV max equal 6.4 on image 129. Multiple lesions throughout the spine. Most intense lesion spinal lesion has associated soft tissue expansion  at the the T2 vertebral body measuring 2.5 cm (107) with SUV max equal 7.3 Lesion in the inferior LEFT pubic rami subtle lucency on image 299. Suspicious lesions in LEFT and RIGHT femoral necks. Incidental CT findings: none EXTREMITIES: Hypermetabolic lesions RIGHT humeral shaft. Lesion the proximal LEFT and RIGHT femurs. Incidental CT findings: none IMPRESSION: 1. Multiple hypermetabolic lytic lesions throughout the skeleton consistent with multiple myeloma. Approximately 50 lesions within the axillary and appendicular skeleton. Many lesions accompanied lytic/lucent lesions on CT. 2. No evidence of soft tissue plasmacytoma. Electronically Signed   By: Genevive Bi M.D.   On: 05/03/2023 15:16   CT Chest W Contrast  Result Date: 05/03/2023 CLINICAL DATA:  Pneumonia with complication suspected. X-ray done. Cough with rib pain after coughing. Shortness of breath and right-sided pain. History of multiple myeloma. EXAM: CT CHEST WITH CONTRAST TECHNIQUE: Multidetector CT imaging of the chest was performed during intravenous contrast administration. RADIATION DOSE REDUCTION: This exam was performed according to the departmental dose-optimization program which includes automated exposure control, adjustment of the mA and/or kV according to patient size and/or use of iterative reconstruction technique. CONTRAST:  60mL OMNIPAQUE IOHEXOL 300 MG/ML  SOLN COMPARISON:  Chest radiograph  05/02/2023.  PET-CT 04/22/2023 FINDINGS: Cardiovascular: Mild cardiac enlargement. No pericardial effusions. Normal caliber thoracic aorta. Calcification of the aorta and coronary arteries. Mediastinum/Nodes: Thyroid gland is unremarkable. Esophagus is decompressed. Scattered mediastinal lymph nodes are not pathologically enlarged. Lungs/Pleura: Motion artifact limits examination. There is evidence of atelectasis or consolidation in both lung bases. Small bilateral pleural effusions. Changes could represent pneumonia. No pneumothorax. Upper Abdomen: No acute abnormalities. Musculoskeletal: Degenerative changes in the spine and shoulders. Multiple lucent lesions demonstrated in the ribs, spine, and shoulders consistent with history of myeloma. Fractures of the right lateral fifth, sixth, and seventh ribs as well as the right tenth rib. Fractures of the left third, fourth, and sixth ribs. No vertebral compression deformities are demonstrated although large lucent lesions are demonstrated in the T1 and T2 vertebrae. IMPRESSION: 1. Multiple lucent lesions demonstrated in the ribs, spine, and shoulders corresponding to the patient's history of multiple myeloma. 2. Multiple bilateral acute rib fractures. 3. Small bilateral pleural effusions with basilar atelectasis or consolidation, possibly pneumonia. Electronically Signed   By: Burman Nieves M.D.   On: 05/03/2023 01:21   DG Chest Port 1 View  Result Date: 05/02/2023 CLINICAL DATA:  Shortness of breath and cough. History of multiple myeloma. Right-sided pain. EXAM: PORTABLE CHEST 1 VIEW COMPARISON:  04/20/2023 FINDINGS: Shallow inspiration. Cardiac enlargement. No vascular congestion or edema. Linear atelectasis in the lung bases. No pleural effusions. No pneumothorax. Calcification of the aorta. A nondisplaced right lateral ninth rib fracture is suggested. IMPRESSION: Shallow inspiration with atelectasis in the lung bases. Cardiac enlargement. Probable right  ninth rib fracture. Electronically Signed   By: Burman Nieves M.D.   On: 05/02/2023 23:45   CT BONE MARROW BIOPSY & ASPIRATION  Result Date: 04/27/2023 INDICATION: myeloma staging EXAM: CT GUIDED BONE MARROW ASPIRATION AND CORE BIOPSY MEDICATIONS: None. ANESTHESIA/SEDATION: Moderate (conscious) sedation was employed during this procedure. A total of Versed 2 mg and Fentanyl 100 mcg was administered intravenously. Moderate Sedation Time: 13 minutes. The patient's level of consciousness and vital signs were monitored continuously by radiology nursing throughout the procedure under my direct supervision. FLUOROSCOPY TIME:  CT dose; 122 mGycm COMPLICATIONS: None immediate. Estimated blood loss: <5 mL PROCEDURE: RADIATION DOSE REDUCTION: This exam was performed according to the departmental dose-optimization program which includes automated  exposure control, adjustment of the mA and/or kV according to patient size and/or use of iterative reconstruction technique. Informed written consent was obtained from the patient after a thorough discussion of the procedural risks, benefits and alternatives. All questions were addressed. Maximal Sterile Barrier Technique was utilized including caps, mask, sterile gowns, sterile gloves, sterile drape, hand hygiene and skin antiseptic. A timeout was performed prior to the initiation of the procedure. The patient was positioned prone and non-contrast localization CT was performed of the pelvis to demonstrate the iliac marrow spaces. Maximal barrier sterile technique utilized including caps, mask, sterile gowns, sterile gloves, large sterile drape, hand hygiene, and chlorhexidine prep. Under sterile conditions and local anesthesia, an 11 gauge coaxial bone biopsy needle was advanced into the RIGHT iliac marrow space. Needle position was confirmed with CT imaging. Initially, bone marrow aspiration was performed. Next, the 11 gauge outer cannula was utilized to obtain a 1 iliac  bone marrow core biopsy. Needle was removed. Hemostasis was obtained with compression. The patient tolerated the procedure well. Samples were prepared with the cytotechnologist. IMPRESSION: Successful CT-guided bone marrow aspiration and biopsy. Roanna Banning, MD [facility] Radiology Electronically Signed   By: Roanna Banning M.D.   On: 04/27/2023 12:32   DG Chest 2 View  Result Date: 04/23/2023 CLINICAL DATA:  Cough for 2 months Pain between shoulder blades EXAM: CHEST - 2 VIEW COMPARISON:  08/24/2019 FINDINGS: Cardiomediastinal silhouette and pulmonary vasculature are within normal limits. Lungs are hyperexpanded but otherwise clear. Unchanged dextroconvex curvature of the thoracolumbar spine. IMPRESSION: 1. No acute cardiopulmonary process. 2. Hyperexpanded lungs, suspicious for emphysema. Electronically Signed   By: Acquanetta Belling M.D.   On: 04/23/2023 16:44

## 2023-05-06 NOTE — Telephone Encounter (Signed)
S/w pt this morning to initiate Revlimid rx via [employer]. He was educated on medication and given instructions regarding safety precautions. He verbalized understanding and agreement of patient survey. Signed and sent to [employer] per MD.

## 2023-05-06 NOTE — Telephone Encounter (Signed)
Oral Oncology Patient Advocate Encounter  Was successful in securing patient a $12,000 grant from [facility] to provide copayment coverage for lenalidomide.  This will keep the out of pocket expense at $0.     [employer] ID: [id number]   The billing information is as follows and has been shared with [employer].    RxBin: [id number] PCN: [id number] Member ID: [id number] Group ID: [id number] Dates of Eligibility: 04/06/23 through 04/04/24  Fund:  MM  Jinger Neighbors, CPhT-Adv Oncology Pharmacy Patient Advocate [facility] Direct Number: [phone]  Fax: [phone]

## 2023-05-06 NOTE — Telephone Encounter (Signed)
Oral Oncology Patient Advocate Encounter   Received notification that prior authorization for lenalidomide is required.   PA submitted on 05/06/23 Key [id number] Status is pending     Jinger Neighbors, CPhT-Adv Oncology Pharmacy Patient Advocate [facility] Direct Number: [phone]  Fax: [phone]

## 2023-05-06 NOTE — Progress Notes (Signed)
Physical Therapy Treatment Patient Details Name: Daniel Whitaker MRN: [id number] DOB: November 11, 1945 Today's Date: 05/06/2023   History of Present Illness Patient is a 77 year old male who presented with three day history of 10/10 pain after coughing spell. Patient was admitted with hypercalcemia, and rib fractures. PMH: recent diagnosis of multiple myeloma, MGUS, cataracts, glaucoma, obesity, RLS, spinal stenosis, neuropathy.    PT Comments  Pt continues to progress toward acute PT goals this session with progression of ambulation distance. Pt ambulated ~368ft with contact guard progressing to supervision, no LOB observed.Increased fatigue following ambulation this date compared to prior session and O2 desat 86-87% with mobility on RA with recovery with seated rest. Increased overall fatigue this date- discussed initiation of stair training next session as pt has ~15 steps to enter home. Able to utilize B railings when entering from basement entry. Pt will benefit from continued skilled PT to increase their independence and maximize safety with mobility.      If plan is discharge home, recommend the following: A little help with walking and/or transfers;A little help with bathing/dressing/bathroom;Assistance with cooking/housework;Help with stairs or ramp for entrance;Assist for transportation   Can travel by private vehicle        Equipment Recommendations  Rolling walker (2 wheels)    Recommendations for Other Services       Precautions / Restrictions Precautions Precautions: Fall Precaution Comments: monitor O2 Restrictions Weight Bearing Restrictions: No     Mobility  Bed Mobility               General bed mobility comments: Pt sitting EOB upon entry    Transfers Overall transfer level: Needs assistance Equipment used: Rolling walker (2 wheels) Transfers: Sit to/from Stand Sit to Stand: Contact guard assist           General transfer comment: STS x2, CGA on 2nd  attempt with cues for hand placement. Pt attempting to use B UEs to pull up on RW and wheelcs coming off ground.    Ambulation/Gait Ambulation/Gait assistance: Contact guard assist Gait Distance (Feet): 275 Feet Assistive device: Rolling walker (2 wheels) Gait Pattern/deviations: Step-through pattern, Decreased step length - right, Decreased step length - left, Trunk flexed Gait velocity: decr     General Gait Details: VCs for posture, SpO2 87% on room air walking, no dyspnea; distance limited by fatigue, some BLE tremoring at end of ambulation. X1 step up onto standing scale with use of B UE support on rails.   Stairs             Wheelchair Mobility     Tilt Bed    Modified Rankin (Stroke Patients Only)       Balance Overall balance assessment: Needs assistance Sitting-balance support: Feet supported Sitting balance-Leahy Scale: Fair     Standing balance support: Bilateral upper extremity supported, During functional activity Standing balance-Leahy Scale: Fair Standing balance comment: able to stand statically for short period without UE support                            Cognition Arousal: Alert Behavior During Therapy: Flat affect Overall Cognitive Status: Within Functional Limits for tasks assessed                                          Exercises      General  Comments        Pertinent Vitals/Pain Pain Assessment Pain Assessment: No/denies pain (denied any pain during session)    Home Living                          Prior Function            PT Goals (current goals can now be found in the care plan section) Acute Rehab PT Goals Patient Stated Goal: likes to play golf PT Goal Formulation: With patient/family Time For Goal Achievement: 05/18/23 Potential to Achieve Goals: Good Progress towards PT goals: Progressing toward goals    Frequency    Min 1X/week      PT Plan      Co-evaluation               AM-PAC PT "6 Clicks" Mobility   Outcome Measure  Help needed turning from your back to your side while in a flat bed without using bedrails?: None Help needed moving from lying on your back to sitting on the side of a flat bed without using bedrails?: A Little Help needed moving to and from a bed to a chair (including a wheelchair)?: A Little Help needed standing up from a chair using your arms (e.g., wheelchair or bedside chair)?: A Little Help needed to walk in hospital room?: A Little Help needed climbing 3-5 steps with a railing? : A Little 6 Click Score: 19    End of Session Equipment Utilized During Treatment: Gait belt Activity Tolerance: Patient tolerated treatment well Patient left: in chair;with chair alarm set;with call bell/phone within reach;with family/visitor present Nurse Communication: Mobility status;Other (comment) (O2 sats) PT Visit Diagnosis: Muscle weakness (generalized) (M62.81);Difficulty in walking, not elsewhere classified (R26.2)     Time: 1007-1030 PT Time Calculation (min) (ACUTE ONLY): 23 min  Charges:    $Therapeutic Activity: 23-37 mins PT General Charges $$ ACUTE PT VISIT: 1 Visit                     Lyman Speller PT, DPT  Acute Rehabilitation Services  Office [phone]  05/06/2023, 10:43 AM

## 2023-05-06 NOTE — Telephone Encounter (Signed)
Oral Oncology Patient Advocate Encounter  Prior Authorization for lenalidomide has been approved.    PA# [id number] Effective dates: 05/06/23 through 05/31/24  Patients co-pay is $5,784.69.    Jinger Neighbors, CPhT-Adv Oncology Pharmacy Patient Advocate [facility] Direct Number: [phone]  Fax: [phone]

## 2023-05-06 NOTE — Progress Notes (Signed)
   05/06/23 2255  BiPAP/CPAP/SIPAP  BiPAP/CPAP/SIPAP Pt Type Adult  Mask Type Nasal pillows  Patient Home Equipment Yes  Auto Titrate No

## 2023-05-06 NOTE — Progress Notes (Signed)
Progress Note    Daniel Whitaker   HQI:[id number]  DOB: 12-04-1945  DOA: 05/02/2023     3 PCP: [name], MD  Initial CC: CP, fatigue   Hospital Course: Mr. Burnard is a 77 yo male with PMH MM, CAD, neuropathy, obesity, OSA on CPAP, HLD who presented with chest pain and fatigue. On workup he was found to have AKI, hypercalcemia.  He was started on treatment and admitted for further workup and monitoring.  Assessment and Plan:  Hypercalcemia of malignancy Multiple myeloma -Treated with fluids, Lasix, calcitonin, and pamidronate -Continuing on fluids, calcitonin -Rasburicase added per oncology on 12/5 x 1 dose -Continue trending CMP -Corrected calcium 11.8 mg/dL today, showing improvement   Acute kidney injury - Mostly in the setting of multiple myeloma -Renal ultrasound shows 4 mm nonobstructing stone in lower pole left kidney, no hydronephrosis -Continue fluids - Continue trending renal function; slight uptrend, 2.84 today - Repeat renal function in morning  Normocytic anemia - suspected in setting of MM as well - Hgb 7.6 g/dL today; discussed with oncology, will transfuse 1 unit PRBC today as well - repeat CBC in am   Atelectasis - CT chest noting multiple bilateral acute rib fractures along with lucent lesions involving ribs, spine, and shoulders -CT also noting basilar atelectasis -Encourage incentive spirometer and ambulation -No indication for antibiotics at this time; procalcitonin negative and reassuring  Multiple rib fractures - per CT: "Fractures of the right lateral fifth, sixth, and seventh ribs as well as the right tenth rib. Fractures of the left third, fourth, and sixth ribs. No vertebral compression deformities are demonstrated although large lucent lesions are demonstrated in the T1 and T2 vertebrae."   Chronic neuropathy -Renally adjust Lyrica   Severe constipation Was started on Senokot as well as MiraLAX -Modifying as necessary   Coronary  artery disease. Cath May 2023, DES PCI to LAD. On aspirin and Plavix, was supposed to be for 1 year, still continuing.   HLD On statin   HTN On Coreg 3.125 mg twice daily   Glaucoma Continue eyedrops   Deconditioning Secondary to pain and multiple rib fractures PT OT consulted   Obesity OSA Uses CPAP at night Continue. Body mass index is 31.38 kg/m Placing the patient at high risk of poor outcome  Interval History:  Family present bedside this morning. Update given with all questions answered to the best of my ability. Multiple good questions asked.  Mostly emphasized we're watching calcium and renal function at this time. He seems to be stable with mentation and Hgb is stable but lower end of normal and we'll give 1 unit PRBC today, which they're all amenable to.  Patient mostly having rib pain along the left lateral chest wall aligning with known underlying rib fractures seen on CT.  Pain seems to be intermittent. He was also able to achieve a bowel movement overnight.   Old records reviewed in assessment of this patient  Antimicrobials:   DVT prophylaxis:  SCDs Start: 05/03/23 1547   Code Status:   Code Status: Full Code  Mobility Assessment (Last 72 Hours)     Mobility Assessment     Row Name 05/06/23 1100 05/06/23 1000 05/05/23 2154 05/05/23 1035 05/05/23 0800   Does patient have an order for bedrest or is patient medically unstable No - Continue assessment -- No - Continue assessment -- No - Continue assessment   What is the highest level of mobility based on the progressive mobility assessment? Level 5 (  Walks with assist in room/hall) - Balance while stepping forward/back and can walk in room with assist - Complete Level 5 (Walks with assist in room/hall) - Balance while stepping forward/back and can walk in room with assist - Complete Level 5 (Walks with assist in room/hall) - Balance while stepping forward/back and can walk in room with assist - Complete Level  5 (Walks with assist in room/hall) - Balance while stepping forward/back and can walk in room with assist - Complete Level 5 (Walks with assist in room/hall) - Balance while stepping forward/back and can walk in room with assist - Complete    Row Name 05/04/23 2031 05/04/23 1311 05/04/23 1125 05/03/23 1908     Does patient have an order for bedrest or is patient medically unstable No - Continue assessment -- No - Continue assessment No - Continue assessment    What is the highest level of mobility based on the progressive mobility assessment? Level 5 (Walks with assist in room/hall) - Balance while stepping forward/back and can walk in room with assist - Complete Level 5 (Walks with assist in room/hall) - Balance while stepping forward/back and can walk in room with assist - Complete Level 5 (Walks with assist in room/hall) - Balance while stepping forward/back and can walk in room with assist - Complete Level 5 (Walks with assist in room/hall) - Balance while stepping forward/back and can walk in room with assist - Complete             Barriers to discharge: none Disposition Plan:  Home Status is: Inpt  Objective: Blood pressure 134/88, pulse 65, temperature 97.9 F (36.6 C), temperature source Oral, resp. rate 17, height 6\' 1"  (1.854 m), weight 117 kg, SpO2 93%.  Examination:  Physical Exam Constitutional:      General: He is not in acute distress.    Appearance: Normal appearance.  HENT:     Head: Normocephalic and atraumatic.     Mouth/Throat:     Mouth: Mucous membranes are moist.  Eyes:     Extraocular Movements: Extraocular movements intact.  Cardiovascular:     Rate and Rhythm: Normal rate and regular rhythm.  Pulmonary:     Effort: Pulmonary effort is normal. No respiratory distress.     Breath sounds: Normal breath sounds. No wheezing.  Abdominal:     General: Bowel sounds are normal. There is no distension.     Palpations: Abdomen is soft.     Tenderness: There is no  abdominal tenderness.  Musculoskeletal:        General: Swelling: trace in extremities. Normal range of motion.     Cervical back: Normal range of motion and neck supple.  Skin:    General: Skin is warm and dry.  Neurological:     General: No focal deficit present.     Mental Status: He is alert.  Psychiatric:        Mood and Affect: Mood normal.        Behavior: Behavior normal.      Consultants:  Oncology   Procedures:    Data Reviewed: Results for orders placed or performed during the hospital encounter of 05/02/23 (from the past 24 hour(s))  CBC with Differential/Platelet     Status: Abnormal   Collection Time: 05/05/23  5:00 PM  Result Value Ref Range   WBC 11.5 (H) 4.0 - 10.5 K/uL   RBC 2.60 (L) 4.22 - 5.81 MIL/uL   Hemoglobin 7.5 (L) 13.0 - 17.0 g/dL  HCT 23.6 (L) 39.0 - 52.0 %   MCV 90.8 80.0 - 100.0 fL   MCH 28.8 26.0 - 34.0 pg   MCHC 31.8 30.0 - 36.0 g/dL   RDW 13.0 86.5 - 78.4 %   Platelets 170 150 - 400 K/uL   nRBC 0.0 0.0 - 0.2 %   Neutrophils Relative % 87 %   Neutro Abs 10.0 (H) 1.7 - 7.7 K/uL   Lymphocytes Relative 6 %   Lymphs Abs 0.7 0.7 - 4.0 K/uL   Monocytes Relative 6 %   Monocytes Absolute 0.7 0.1 - 1.0 K/uL   Eosinophils Relative 0 %   Eosinophils Absolute 0.0 0.0 - 0.5 K/uL   Basophils Relative 0 %   Basophils Absolute 0.0 0.0 - 0.1 K/uL   Immature Granulocytes 1 %   Abs Immature Granulocytes 0.09 (H) 0.00 - 0.07 K/uL  Renal function panel     Status: Abnormal   Collection Time: 05/05/23  5:00 PM  Result Value Ref Range   Sodium 144 135 - 145 mmol/L   Potassium 3.6 3.5 - 5.1 mmol/L   Chloride 111 98 - 111 mmol/L   CO2 22 22 - 32 mmol/L   Glucose, Bld 124 (H) 70 - 99 mg/dL   BUN 58 (H) 8 - 23 mg/dL   Creatinine, Ser 6.96 (H) 0.61 - 1.24 mg/dL   Calcium 29.5 (H) 8.9 - 10.3 mg/dL   Phosphorus 3.8 2.5 - 4.6 mg/dL   Albumin 2.3 (L) 3.5 - 5.0 g/dL   GFR, Estimated 23 (L) >60 mL/min   Anion gap 11 5 - 15  Magnesium     Status: None    Collection Time: 05/06/23  7:11 AM  Result Value Ref Range   Magnesium 2.0 1.7 - 2.4 mg/dL  Uric acid     Status: None   Collection Time: 05/06/23  7:11 AM  Result Value Ref Range   Uric Acid, Serum 5.6 3.7 - 8.6 mg/dL  Procalcitonin     Status: None   Collection Time: 05/06/23  7:11 AM  Result Value Ref Range   Procalcitonin 0.10 ng/mL  Comprehensive metabolic panel     Status: Abnormal   Collection Time: 05/06/23  7:11 AM  Result Value Ref Range   Sodium 142 135 - 145 mmol/L   Potassium 3.3 (L) 3.5 - 5.1 mmol/L   Chloride 110 98 - 111 mmol/L   CO2 23 22 - 32 mmol/L   Glucose, Bld 98 70 - 99 mg/dL   BUN 59 (H) 8 - 23 mg/dL   Creatinine, Ser 2.84 (H) 0.61 - 1.24 mg/dL   Calcium 13.2 (H) 8.9 - 10.3 mg/dL   Total Protein 7.6 6.5 - 8.1 g/dL   Albumin 2.4 (L) 3.5 - 5.0 g/dL   AST 20 15 - 41 U/L   ALT 17 0 - 44 U/L   Alkaline Phosphatase 73 38 - 126 U/L   Total Bilirubin 0.4 <1.2 mg/dL   GFR, Estimated 22 (L) >60 mL/min   Anion gap 9 5 - 15  CBC with Differential/Platelet     Status: Abnormal   Collection Time: 05/06/23  7:11 AM  Result Value Ref Range   WBC 9.8 4.0 - 10.5 K/uL   RBC 2.68 (L) 4.22 - 5.81 MIL/uL   Hemoglobin 7.6 (L) 13.0 - 17.0 g/dL   HCT 44.0 (L) 10.2 - 72.5 %   MCV 92.9 80.0 - 100.0 fL   MCH 28.4 26.0 - 34.0 pg   MCHC 30.5  30.0 - 36.0 g/dL   RDW 69.6 (H) 29.5 - 28.4 %   Platelets 178 150 - 400 K/uL   nRBC 0.0 0.0 - 0.2 %   Neutrophils Relative % 78 %   Neutro Abs 7.7 1.7 - 7.7 K/uL   Lymphocytes Relative 15 %   Lymphs Abs 1.5 0.7 - 4.0 K/uL   Monocytes Relative 5 %   Monocytes Absolute 0.5 0.1 - 1.0 K/uL   Eosinophils Relative 1 %   Eosinophils Absolute 0.1 0.0 - 0.5 K/uL   Basophils Relative 0 %   Basophils Absolute 0.0 0.0 - 0.1 K/uL   Immature Granulocytes 1 %   Abs Immature Granulocytes 0.05 0.00 - 0.07 K/uL  Prepare RBC (crossmatch)     Status: None   Collection Time: 05/06/23  9:04 AM  Result Value Ref Range   Order Confirmation      ORDER  PROCESSED BY BLOOD BANK Performed at [facility], [address] [place], [place] [address]     I have reviewed pertinent nursing notes, vitals, labs, and images as necessary. I have ordered labwork to follow up on as indicated.  I have reviewed the last notes from staff over past 24 hours. I have discussed patient's care plan and test results with nursing staff, CM/SW, and other staff as appropriate.  Time spent: Greater than 50% of the 55 minute visit was spent in counseling/coordination of care for the patient as laid out in the A&P.   LOS: 3 days   Lewie Chamber, MD [facility] 05/06/2023, 4:23 PM

## 2023-05-06 NOTE — Progress Notes (Signed)
START ON PATHWAY REGIMEN - Multiple Myeloma and Other Plasma Cell Dyscrasias   DaraVRd (Daratumumab SUBQ + Bortezomib SUBQ D1,4,8,11 + Lenalidomide PO D1-14 + Dexamethasone 20 mg IV/PO D1,2,8,9,15,16) q21 Days (Induction Schema):   A cycle is every 21 days:     Lenalidomide      Dexamethasone      Bortezomib      Daratumumab and hyaluronidase-fihj    DaraVRd (Daratumumab SUBQ + Bortezomib SUBQ D1,4,8,11 + Lenalidomide PO D1-14 + Dexamethasone 20 mg IV/PO D1,2,8,9,15,16) q21 Days (Consolidation Schema):   A cycle is every 21 days:     Lenalidomide      Dexamethasone      Bortezomib      Daratumumab and hyaluronidase-fihj   **Always confirm dose/schedule in your pharmacy ordering system**  Patient Characteristics: Multiple Myeloma, Newly Diagnosed, Transplant Eligible, Unknown Risk or Awaiting Test Results Disease Classification: Multiple Myeloma Therapeutic Status: Newly Diagnosed R2-ISS Staging: III Is Patient Eligible for Transplant<= Transplant Eligible Risk Status: Awaiting Test Results Intent of Therapy: Non-Curative / Palliative Intent, Discussed with Patient

## 2023-05-07 ENCOUNTER — Encounter: Payer: Self-pay | Admitting: Hematology and Oncology

## 2023-05-07 ENCOUNTER — Other Ambulatory Visit: Payer: Self-pay | Admitting: Hematology and Oncology

## 2023-05-07 ENCOUNTER — Other Ambulatory Visit (HOSPITAL_COMMUNITY): Payer: Self-pay

## 2023-05-07 ENCOUNTER — Other Ambulatory Visit: Payer: Self-pay

## 2023-05-07 DIAGNOSIS — N179 Acute kidney failure, unspecified: Secondary | ICD-10-CM | POA: Diagnosis not present

## 2023-05-07 DIAGNOSIS — C9000 Multiple myeloma not having achieved remission: Secondary | ICD-10-CM

## 2023-05-07 DIAGNOSIS — J9811 Atelectasis: Secondary | ICD-10-CM | POA: Diagnosis not present

## 2023-05-07 LAB — COMPREHENSIVE METABOLIC PANEL
ALT: 21 U/L (ref 0–44)
AST: 22 U/L (ref 15–41)
Albumin: 2.2 g/dL — ABNORMAL LOW (ref 3.5–5.0)
Alkaline Phosphatase: 78 U/L (ref 38–126)
Anion gap: 9 (ref 5–15)
BUN: 58 mg/dL — ABNORMAL HIGH (ref 8–23)
CO2: 21 mmol/L — ABNORMAL LOW (ref 22–32)
Calcium: 9.6 mg/dL (ref 8.9–10.3)
Chloride: 112 mmol/L — ABNORMAL HIGH (ref 98–111)
Creatinine, Ser: 2.79 mg/dL — ABNORMAL HIGH (ref 0.61–1.24)
GFR, Estimated: 23 mL/min — ABNORMAL LOW (ref >60)
Glucose, Bld: 87 mg/dL (ref 70–99)
Potassium: 3.8 mmol/L (ref 3.5–5.1)
Sodium: 142 mmol/L (ref 135–145)
Total Bilirubin: 0.3 mg/dL (ref <1.2)
Total Protein: 7.3 g/dL (ref 6.5–8.1)

## 2023-05-07 LAB — CBC WITH DIFFERENTIAL/PLATELET
Abs Immature Granulocytes: 0.03 10*3/uL (ref 0.00–0.07)
Basophils Absolute: 0 10*3/uL (ref 0.0–0.1)
Basophils Relative: 0 %
Eosinophils Absolute: 0.2 10*3/uL (ref 0.0–0.5)
Eosinophils Relative: 3 %
HCT: 25.2 % — ABNORMAL LOW (ref 39.0–52.0)
Hemoglobin: 7.7 g/dL — ABNORMAL LOW (ref 13.0–17.0)
Immature Granulocytes: 0 %
Lymphocytes Relative: 23 %
Lymphs Abs: 1.6 10*3/uL (ref 0.7–4.0)
MCH: 28.7 pg (ref 26.0–34.0)
MCHC: 30.6 g/dL (ref 30.0–36.0)
MCV: 94 fL (ref 80.0–100.0)
Monocytes Absolute: 0.4 10*3/uL (ref 0.1–1.0)
Monocytes Relative: 6 %
Neutro Abs: 4.6 10*3/uL (ref 1.7–7.7)
Neutrophils Relative %: 68 %
Platelets: 165 10*3/uL (ref 150–400)
RBC: 2.68 MIL/uL — ABNORMAL LOW (ref 4.22–5.81)
RDW: 16 % — ABNORMAL HIGH (ref 11.5–15.5)
WBC: 6.8 10*3/uL (ref 4.0–10.5)
nRBC: 0 % (ref 0.0–0.2)

## 2023-05-07 LAB — MAGNESIUM: Magnesium: 2 mg/dL (ref 1.7–2.4)

## 2023-05-07 LAB — URIC ACID: Uric Acid, Serum: 3.8 mg/dL (ref 3.7–8.6)

## 2023-05-07 LAB — PREPARE RBC (CROSSMATCH)

## 2023-05-07 LAB — HEMOGLOBIN AND HEMATOCRIT, BLOOD
HCT: 28.9 % — ABNORMAL LOW (ref 39.0–52.0)
Hemoglobin: 9.2 g/dL — ABNORMAL LOW (ref 13.0–17.0)

## 2023-05-07 MED ORDER — DEXAMETHASONE 4 MG PO TABS
6.0000 mg | ORAL_TABLET | Freq: Every morning | ORAL | 0 refills | Status: DC
  Filled 2023-05-07: qty 45, 30d supply, fill #0

## 2023-05-07 MED ORDER — CALCITONIN (SALMON) 200 UNIT/ACT NA SOLN: 1.0000 | Freq: Every day | NASAL | Status: DC

## 2023-05-07 MED ORDER — LENALIDOMIDE 10 MG PO CAPS: 10.0000 mg | ORAL_CAPSULE | Freq: Every day | ORAL | 0 refills | Status: AC

## 2023-05-07 MED ORDER — SODIUM CHLORIDE 0.9% IV SOLUTION: Freq: Once | INTRAVENOUS | Status: AC

## 2023-05-07 MED ORDER — POLYETHYLENE GLYCOL 3350 17 G PO PACK: 17.0000 g | PACK | Freq: Every day | ORAL | Status: DC

## 2023-05-07 MED ORDER — ACETAMINOPHEN 325 MG PO TABS
650.0000 mg | ORAL_TABLET | Freq: Once | ORAL | Status: DC
  Filled 2023-05-07: qty 2

## 2023-05-07 MED ORDER — MORPHINE SULFATE ER 15 MG PO TBCR
15.0000 mg | EXTENDED_RELEASE_TABLET | Freq: Every day | ORAL | 0 refills | Status: DC
  Filled 2023-05-07: qty 30, 30d supply, fill #0

## 2023-05-07 MED ORDER — DEXAMETHASONE 4 MG PO TABS
6.0000 mg | ORAL_TABLET | Freq: Every day | ORAL | Status: DC
  Administered 2023-05-07: 6 mg via ORAL
  Filled 2023-05-07: qty 2

## 2023-05-07 MED ORDER — OXYCODONE HCL ER 10 MG PO T12A
10.0000 mg | EXTENDED_RELEASE_TABLET | Freq: Every day | ORAL | Status: DC
Start: 2023-05-07 — End: 2023-05-07

## 2023-05-07 MED ORDER — SENNOSIDES-DOCUSATE SODIUM 8.6-50 MG PO TABS: 1.0000 | ORAL_TABLET | Freq: Two times a day (BID) | ORAL | Status: DC

## 2023-05-07 MED ORDER — OXYCODONE HCL 5 MG PO TABS
5.0000 mg | ORAL_TABLET | ORAL | 0 refills | Status: DC | PRN
  Filled 2023-05-07: qty 60, 10d supply, fill #0

## 2023-05-07 NOTE — Progress Notes (Signed)
SATURATION QUALIFICATIONS: (This note is used to comply with regulatory documentation for home oxygen)  Patient Saturations on Room Air at Rest = 88%  Patient Saturations on Room Air while Ambulating = 82%  Patient Saturations on 4 Liters of oxygen while Ambulating = 94%  Please briefly explain why patient needs home oxygen:  Patient requires 2 liters of O2 at rest and up to 4 Liters of O2 while ambulating

## 2023-05-07 NOTE — Progress Notes (Signed)
Daniel Whitaker   DOB:30-Aug-1945   ZO#:[id number]    ASSESSMENT & PLAN:  Multiple myeloma PET/CT imaging and recent bone marrow biopsy confirmed the diagnosis The patient is admitted for malignant hypercalcemia We discussed the importance of getting his calcium level and renal function improved before we start him on treatment I spent almost 60 minutes today talking to his family and coordinating his outpatient appointment next week I was able to get lenalidomide approved and hopefully we can get it delivered next week He is scheduled to receive first dose of chemotherapy in the outpatient clinic next Friday Continue supportive care   Malignant hypercalcemia, improving Due to untreated multiple myeloma He received 1 dose of pamidronate on December 2 He received high-dose pulse dexamethasone 20 mg p.o. on December 3 He was started on nasal calcitonin on December 3 and we will continue We will continue IV fluids.  When he is discharged, he is directed to drink approximately 1 gallon of fluids per day We will add furosemide in the future once we see improvement of his renal function   Severe anemia Multifactorial, component of renal failure, bone marrow disease and hemodilution He received 1 unit of blood yesterday and I will prescribe another unit of blood today We discussed some of the risks, benefits, and alternatives of blood transfusions. The patient is symptomatic from anemia and the hemoglobin level is critically low.  Some of the side-effects to be expected including risks of transfusion reactions, chills, infection, syndrome of volume overload and risk of hospitalization from various reasons and the patient is willing to proceed and went ahead to sign consent today.  Will continue to monitor closely   Acute renal failure, improving Due to dehydration, hypercalcemia and diagnosis of multiple myeloma and possible tumor lysis syndrome due to high level of uric acid after a dose of  pulse dexamethasone I encouraged the patient to increase oral fluid intake as tolerated He will continue to receive IV fluids until discharge   Hyperuricemia, improving He received 1 dose of rasburicase on May 05, 2023 and we will follow on uric acid level daily for the next 3 days I plan to recheck next week before adding allopurinol in the future   Cancer associated pain He will continue current prescribed OxyContin and oxycodone as needed Due to sedation, I will change oxycodone to nighttime dose only   Severe constipation, resolving Likely exacerbated by pain and hypercalcemia We discussed importance of aggressive laxative therapy   Goals of care Resolution of severe hypercalcemia   Discharge planning He can be discharged later today  All questions were answered. The patient knows to call the clinic with any problems, questions or concerns.   The total time spent in the appointment was 55 minutes encounter with patients including review of chart and various tests results, discussions about plan of care and coordination of care plan  [name], MD 05/07/2023 8:33 AM  Subjective:  Patient is seen with family by the bedside.  He is alert and oriented.  He has mild pain on the left side of his ribs.  He is somewhat sedated yesterday but appears well alert and oriented this morning.  He had another bowel movement yesterday.  No nausea We discussed changes in medications and future plan of care  Objective:  Vitals:   05/06/23 2154 05/07/23 0527  BP: (!) 157/75 139/75  Pulse: 69 65  Resp: 18 18  Temp: 97.7 F (36.5 C) 98.4 F (36.9 C)  SpO2: 97%  91%     Intake/Output Summary (Last 24 hours) at 05/07/2023 0833 Last data filed at 05/07/2023 0535 Gross per 24 hour  Intake 1546.1 ml  Output --  Net 1546.1 ml    GENERAL:alert, no distress and comfortable    Labs:  Recent Labs    05/03/23 1622 05-05-23 0557 05/05/23 1700 05/06/23 0711 05/07/23 0619  NA 137   <  > 144 142 142  K 3.3*   < > 3.6 3.3* 3.8  CL 107   < > 111 110 112*  CO2 26   < > 22 23 21*  GLUCOSE 110*   < > 124* 98 87  BUN 40*   < > 58* 59* 58*  CREATININE 1.65*   < > 2.74* 2.84* 2.79*  CALCIUM 12.1*   < > 11.5* 10.5* 9.6  GFRNONAA 43*   < > 23* 22* 23*  PROT 7.9  --   --  7.6 7.3  ALBUMIN 2.5*   < > 2.3* 2.4* 2.2*  AST 14*  --   --  20 22  ALT 15  --   --  17 21  ALKPHOS 67  --   --  73 78  BILITOT 0.4  --   --  0.4 0.3   < > = values in this interval not displayed.    Studies:  US RENAL  Result Date: 05/05/23 CLINICAL DATA:  Acute kidney injury EXAM: RENAL / URINARY TRACT ULTRASOUND COMPLETE COMPARISON:  PET-CT 04/22/2023 FINDINGS: Right Kidney: Renal measurements: 14.1 x 6 x 6.7 cm = volume: 301 mL. Echogenicity within normal limits. No mass or hydronephrosis visualized. Left Kidney: Renal measurements: 14.1 x 6 x 6.6 cm = volume: 288 mL. Echogenicity within normal limits. No mass or hydronephrosis visualized. 4 mm echogenic focus demonstrated in the lower pole consistent with a stone. Bladder: Appears normal for degree of bladder distention. Other: None. IMPRESSION: 1. 4 mm nonobstructing stone in the lower pole left kidney. 2. No hydronephrosis or solid mass in either kidney. Electronically Signed   By: Burman Nieves M.D.   On: 05-May-2023 23:49   NM PET Image Initial (PI) Whole Body (F-18 FDG)  Result Date: 05/03/2023 CLINICAL DATA:  Subsequent treatment strategy for multiple myeloma. EXAM: NUCLEAR MEDICINE PET WHOLE BODY TECHNIQUE: 13 mCi F-18 FDG was injected intravenously. Full-ring PET imaging was performed from the head to foot after the radiotracer. CT data was obtained and used for attenuation correction and anatomic localization. Fasting blood glucose: 114 mg/dl COMPARISON:  None Available. FINDINGS: Mediastinal blood pool activity: SUV max 2.0 HEAD/NECK: No hypermetabolic activity in the scalp. No hypermetabolic cervical lymph nodes. Incidental CT findings: Is  CHEST: No hypermetabolic mediastinal or hilar nodes. No suspicious pulmonary nodules on the CT scan. Incidental CT findings: none ABDOMEN/PELVIS: No abnormal hypermetabolic activity within the liver, pancreas, adrenal glands, or spleen. No hypermetabolic lymph nodes in the abdomen or pelvis. Incidental CT findings: none SKELETON: There multiple foci focal radiotracer activity within the skeleton. Lesions are accompaniedlytic change on CT portion exam. There approximately 50 lesions. For example lesion the posterior RIGHT fifth rib with SUV max equal 3.6 on image 120. There is a lucent lesion and pathologic fracture associated with hypermetabolic activity the posterior LEFT sixth rib with SUV max equal 6.4 on image 129. Multiple lesions throughout the spine. Most intense lesion spinal lesion has associated soft tissue expansion at the the T2 vertebral body measuring 2.5 cm (107) with SUV max equal 7.3 Lesion in the  inferior LEFT pubic rami subtle lucency on image 299. Suspicious lesions in LEFT and RIGHT femoral necks. Incidental CT findings: none EXTREMITIES: Hypermetabolic lesions RIGHT humeral shaft. Lesion the proximal LEFT and RIGHT femurs. Incidental CT findings: none IMPRESSION: 1. Multiple hypermetabolic lytic lesions throughout the skeleton consistent with multiple myeloma. Approximately 50 lesions within the axillary and appendicular skeleton. Many lesions accompanied lytic/lucent lesions on CT. 2. No evidence of soft tissue plasmacytoma. Electronically Signed   By: Genevive Bi M.D.   On: 05/03/2023 15:16   CT Chest W Contrast  Result Date: 05/03/2023 CLINICAL DATA:  Pneumonia with complication suspected. X-ray done. Cough with rib pain after coughing. Shortness of breath and right-sided pain. History of multiple myeloma. EXAM: CT CHEST WITH CONTRAST TECHNIQUE: Multidetector CT imaging of the chest was performed during intravenous contrast administration. RADIATION DOSE REDUCTION: This exam was  performed according to the departmental dose-optimization program which includes automated exposure control, adjustment of the mA and/or kV according to patient size and/or use of iterative reconstruction technique. CONTRAST:  60mL OMNIPAQUE IOHEXOL 300 MG/ML  SOLN COMPARISON:  Chest radiograph 05/02/2023.  PET-CT 04/22/2023 FINDINGS: Cardiovascular: Mild cardiac enlargement. No pericardial effusions. Normal caliber thoracic aorta. Calcification of the aorta and coronary arteries. Mediastinum/Nodes: Thyroid gland is unremarkable. Esophagus is decompressed. Scattered mediastinal lymph nodes are not pathologically enlarged. Lungs/Pleura: Motion artifact limits examination. There is evidence of atelectasis or consolidation in both lung bases. Small bilateral pleural effusions. Changes could represent pneumonia. No pneumothorax. Upper Abdomen: No acute abnormalities. Musculoskeletal: Degenerative changes in the spine and shoulders. Multiple lucent lesions demonstrated in the ribs, spine, and shoulders consistent with history of myeloma. Fractures of the right lateral fifth, sixth, and seventh ribs as well as the right tenth rib. Fractures of the left third, fourth, and sixth ribs. No vertebral compression deformities are demonstrated although large lucent lesions are demonstrated in the T1 and T2 vertebrae. IMPRESSION: 1. Multiple lucent lesions demonstrated in the ribs, spine, and shoulders corresponding to the patient's history of multiple myeloma. 2. Multiple bilateral acute rib fractures. 3. Small bilateral pleural effusions with basilar atelectasis or consolidation, possibly pneumonia. Electronically Signed   By: Burman Nieves M.D.   On: 05/03/2023 01:21   DG Chest Port 1 View  Result Date: 05/02/2023 CLINICAL DATA:  Shortness of breath and cough. History of multiple myeloma. Right-sided pain. EXAM: PORTABLE CHEST 1 VIEW COMPARISON:  04/20/2023 FINDINGS: Shallow inspiration. Cardiac enlargement. No vascular  congestion or edema. Linear atelectasis in the lung bases. No pleural effusions. No pneumothorax. Calcification of the aorta. A nondisplaced right lateral ninth rib fracture is suggested. IMPRESSION: Shallow inspiration with atelectasis in the lung bases. Cardiac enlargement. Probable right ninth rib fracture. Electronically Signed   By: Burman Nieves M.D.   On: 05/02/2023 23:45   CT BONE MARROW BIOPSY & ASPIRATION  Result Date: 04/27/2023 INDICATION: myeloma staging EXAM: CT GUIDED BONE MARROW ASPIRATION AND CORE BIOPSY MEDICATIONS: None. ANESTHESIA/SEDATION: Moderate (conscious) sedation was employed during this procedure. A total of Versed 2 mg and Fentanyl 100 mcg was administered intravenously. Moderate Sedation Time: 13 minutes. The patient's level of consciousness and vital signs were monitored continuously by radiology nursing throughout the procedure under my direct supervision. FLUOROSCOPY TIME:  CT dose; 122 mGycm COMPLICATIONS: None immediate. Estimated blood loss: <5 mL PROCEDURE: RADIATION DOSE REDUCTION: This exam was performed according to the departmental dose-optimization program which includes automated exposure control, adjustment of the mA and/or kV according to patient size and/or use of iterative reconstruction technique.  Informed written consent was obtained from the patient after a thorough discussion of the procedural risks, benefits and alternatives. All questions were addressed. Maximal Sterile Barrier Technique was utilized including caps, mask, sterile gowns, sterile gloves, sterile drape, hand hygiene and skin antiseptic. A timeout was performed prior to the initiation of the procedure. The patient was positioned prone and non-contrast localization CT was performed of the pelvis to demonstrate the iliac marrow spaces. Maximal barrier sterile technique utilized including caps, mask, sterile gowns, sterile gloves, large sterile drape, hand hygiene, and chlorhexidine prep. Under  sterile conditions and local anesthesia, an 11 gauge coaxial bone biopsy needle was advanced into the RIGHT iliac marrow space. Needle position was confirmed with CT imaging. Initially, bone marrow aspiration was performed. Next, the 11 gauge outer cannula was utilized to obtain a 1 iliac bone marrow core biopsy. Needle was removed. Hemostasis was obtained with compression. The patient tolerated the procedure well. Samples were prepared with the cytotechnologist. IMPRESSION: Successful CT-guided bone marrow aspiration and biopsy. Roanna Banning, MD [facility] Electronically Signed   By: Roanna Banning M.D.   On: 04/27/2023 12:32   DG Chest 2 View  Result Date: 04/23/2023 CLINICAL DATA:  Cough for 2 months Pain between shoulder blades EXAM: CHEST - 2 VIEW COMPARISON:  08/24/2019 FINDINGS: Cardiomediastinal silhouette and pulmonary vasculature are within normal limits. Lungs are hyperexpanded but otherwise clear. Unchanged dextroconvex curvature of the thoracolumbar spine. IMPRESSION: 1. No acute cardiopulmonary process. 2. Hyperexpanded lungs, suspicious for emphysema. Electronically Signed   By: Acquanetta Belling M.D.   On: 04/23/2023 16:44

## 2023-05-07 NOTE — Progress Notes (Signed)
Discharge medications delivered in a secure bag to pt in room by this RN

## 2023-05-07 NOTE — TOC Transition Note (Addendum)
Transition of Care [facility]) - CM/SW Discharge Note   Patient Details  Name: Daniel Whitaker MRN: [id number] Date of Birth: 01-27-1946  Transition of Care [facility]) CM/SW Contact:  Howell Rucks, RN Phone Number: 05/07/2023, 11:03 AM   Clinical Narrative:   Met with pt and spouse at bedside to introduce role of TOC/NCM and review for dc planning, PT eval recommendation for [facility] PT/OT, spouse agreeable, no preference. Spouse reports pt has a PCP, Oncologist and pharmacy in place, no current home care services or home DME, spouse requesting RW, attending notified, order entered. [employer], rep-Jermaine to deliver RW to bedside, bedside nurse states patient may need home 02, await 02 sat ambulation results,. Family to provide transportation home. No further TOC needs identified.   -1:03pm [facility], rep-Amy, accepted for [facility] PT/OT, added to AVS.  [employer], rep Jermaine, RW and Home 02 to be delivered to bedside, added to AVS     Final next level of care: Home w Home Health Services Barriers to Discharge: Barriers Resolved   Patient Goals and CMS Choice CMS Medicare.gov Compare Post Acute Care list provided to:: Patient Represenative (must comment) (Cowgill,Linda (Spouse)  [phone] (Mobile)) Choice offered to / list presented to : Spouse  Discharge Placement                         Discharge Plan and Services Additional resources added to the After Visit Summary for                  DME Arranged: [employer]xygen, Dan Humphreys DME Agency: [employer] Date DME Agency Contacted: 05/07/23 Time DME Agency Contacted: 1038 Representative spoke with at DME Agency: [name] HH Arranged: PT, OT          Social Determinants of Health (SDOH) Interventions SDOH Screenings   Food Insecurity: No Food Insecurity (05/03/2023)  Housing: Low Risk  (05/03/2023)  Transportation Needs: No Transportation Needs (05/03/2023)  Utilities: Not At Risk (05/03/2023)  Depression (PHQ2-9): Low Risk  (03/12/2022)   Social Connections: Unknown (10/10/2021)   Received from [facility]  Tobacco Use: Medium Risk (05/03/2023)     Readmission Risk Interventions    05/07/2023   10:37 AM  Readmission Risk Prevention Plan  Transportation Screening Complete  PCP or Specialist Appt within 5-7 Days Complete  Home Care Screening Complete  Medication Review (RN CM) Complete

## 2023-05-07 NOTE — Discharge Summary (Signed)
Physician Discharge Summary   Daniel Whitaker VHQ:[id number] DOB: [birth year] DOA: 05/02/2023  PCP: Daniel Raider, MD  Admit date: 05/02/2023 Discharge date: 05/07/2023  Admitted From: Home Disposition:  Home Discharging physician: Daniel Chamber, MD Barriers to discharge: none  Recommendations at discharge: Follow up with oncology Check CMP   Discharge Condition: stable CODE STATUS: Full Diet recommendation:  Diet Orders (From admission, onward)     Start     Ordered   05/07/23 0000  Diet general        05/07/23 1556   05/03/23 1548  Diet regular Room service appropriate? Yes; Fluid consistency: Thin  Diet effective now       Question Answer Comment  Room service appropriate? Yes   Fluid consistency: Thin      05/03/23 1547            Hospital Course: Daniel Whitaker is a 77 yo male with PMH MM, CAD, neuropathy, obesity, OSA on CPAP, HLD who presented with chest pain and fatigue. On workup he was found to have AKI, hypercalcemia.  He was started on treatment and admitted for further workup and monitoring.  Assessment and Plan:  Hypercalcemia of malignancy Multiple myeloma -Treated with fluids, Lasix, calcitonin, and pamidronate -Rasburicase added per oncology on 12/5 x 1 dose -Corrected calcium 11 mg/dL today, showing improvement; calcitonin continued at discharge to finish bottle used during hospitalization; labs to be checked at outpatient follow-up with oncology   Acute kidney injury - Mostly in the setting of multiple myeloma -Renal ultrasound shows 4 mm nonobstructing stone in lower pole left kidney, no hydronephrosis -Stable renal function, creatinine 2.79 at discharge  Normocytic anemia - suspected in setting of MM as well -Received 2 units PRBC during hospitalization - Hemoglobin 9.2 g/dL at discharge (after completing PRBC)   Atelectasis Acute hypoxia - CT chest noting multiple bilateral acute rib fractures along with lucent lesions involving ribs,  spine, and shoulders -CT also noting basilar atelectasis -Encourage incentive spirometer and ambulation -No indication for antibiotics at this time; procalcitonin negative and reassuring -Still having ongoing oxygen desaturation with ambulating.  Arranging patient for home oxygen at discharge.  He can continue to slowly taper off if able  Multiple rib fractures - per CT: "Fractures of the right lateral fifth, sixth, and seventh ribs as well as the right tenth rib. Fractures of the left third, fourth, and sixth ribs. No vertebral compression deformities are demonstrated although large lucent lesions are demonstrated in the T1 and T2 vertebrae."   Chronic neuropathy -continue lyrica    Severe constipation Was started on Senokot as well as MiraLAX -Modifying as necessary   Coronary artery disease. Cath May 2023, DES PCI to LAD. On aspirin and Plavix, was supposed to be for 1 year, still continuing   HLD On statin   HTN On Coreg 3.125 mg twice daily   Glaucoma Continue eyedrops   Deconditioning Secondary to pain and multiple rib fractures PT OT consulted   Obesity OSA Uses CPAP at night Continue. Body mass index is 31.38 kg/m Placing the patient at high risk of poor outcome    The patient's acute and chronic medical conditions were treated accordingly. On day of discharge, patient was felt deemed stable for discharge. Patient/family member advised to call PCP or come back to ER if needed.   Principal Diagnosis: Hypercalcemia  Discharge Diagnoses: Active Hospital Problems   Diagnosis Date Noted   Hypercalcemia 05/03/2023   AKI (acute kidney injury) (HCC) 05/03/2023   Rib  fractures 05/03/2023   Atelectasis 05/03/2023   Multiple myeloma without remission (HCC) 07/11/2013    Resolved Hospital Problems  No resolved problems to display.     Discharge Instructions     Diet general   Complete by: As directed    Increase activity slowly   Complete by: As directed        Allergies as of 05/07/2023       Reactions   Atorvastatin Other (See Comments)   Made the hands ACHE   Cymbalta [duloxetine Hcl] Nausea Only, Other (See Comments)   Drowsiness, also   Penicillins Hives        Medication List     TAKE these medications    albuterol 108 (90 Base) MCG/ACT inhaler Commonly known as: VENTOLIN HFA Inhale 1-2 puffs into the lungs every 6 (six) hours as needed for wheezing or shortness of breath.   Aspirin Low Dose 81 MG tablet Generic drug: aspirin EC Take 1 tablet (81 mg total) by mouth daily. Swallow whole.   brimonidine 0.2 % ophthalmic solution Commonly known as: ALPHAGAN Place 1 drop into the left eye in the morning and at bedtime.   calcitonin (salmon) 200 UNIT/ACT nasal spray Commonly known as: MIACALCIN/FORTICAL Place 1 spray into alternate nostrils daily. One spray in alternating nostril daily. Start taking on: May 08, 2023   carvedilol 3.125 MG tablet Commonly known as: COREG Take 1 tablet (3.125 mg total) by mouth 2 (two) times daily.   Centrum Silver 50+Men Tabs Take 1 tablet by mouth daily with breakfast.   cetirizine 10 MG tablet Commonly known as: ZYRTEC Take 10 mg by mouth every morning.   clopidogrel 75 MG tablet Commonly known as: PLAVIX Take 1 tablet (75 mg total) by mouth daily.   cyclobenzaprine 10 MG tablet Commonly known as: FLEXERIL Take 10 mg by mouth 3 (three) times daily as needed for muscle spasms.   dexamethasone 4 MG tablet Commonly known as: DECADRON Take 1.5 tablets (6 mg total) by mouth in the morning. Take with food What changed: how much to take   docusate sodium 100 MG capsule Commonly known as: COLACE Take 100-200 mg by mouth 2 (two) times daily as needed for mild constipation.   dorzolamide-timolol 2-0.5 % ophthalmic solution Commonly known as: COSOPT Place 1 drop into both eyes 2 (two) times daily.   Fish Oil 1200 MG Caps Take 3,600 mg by mouth daily. 3    HYDROcodone-acetaminophen 5-325 MG tablet Commonly known as: NORCO/VICODIN Take 1-2 tablets by mouth every 8 (eight) hours as needed for moderate pain (pain score 4-6).   ketoconazole 2 % cream Commonly known as: NIZORAL Apply 1 application  topically daily as needed for irritation.   latanoprost 0.005 % ophthalmic solution Commonly known as: XALATAN Place 1 drop into both eyes at bedtime.   lenalidomide 10 MG capsule Commonly known as: REVLIMID Take 1 capsule (10 mg total) by mouth daily for 14 days. Take for 14 days on, 7 days off, repeat every 21 days x 6. [employer] Auth #   [id number],  Date Obtained 05/06/23   lidocaine 5 % ointment Commonly known as: XYLOCAINE Apply 1 Application topically 2 (two) times daily as needed. What changed: reasons to take this   magnesium oxide 400 (240 Mg) MG tablet Commonly known as: MAG-OX Take 400 mg by mouth daily.   morphine 15 MG 12 hr tablet Commonly known as: MS CONTIN Take 1 tablet (15 mg total) by mouth at bedtime.   nitroGLYCERIN  0.4 MG SL tablet Commonly known as: Nitrostat Place 1 tablet (0.4 mg total) under the tongue every 5 (five) minutes as needed. What changed: reasons to take this   oxyCODONE 5 MG immediate release tablet Commonly known as: Oxy IR/ROXICODONE Take 1 tablet (5 mg total) by mouth every 4 (four) hours as needed for severe pain (pain score 7-10).   pantoprazole 40 MG tablet Commonly known as: PROTONIX Take 40 mg by mouth daily before breakfast.   polyethylene glycol 17 g packet Commonly known as: MIRALAX / GLYCOLAX Take 17 g by mouth daily. Start taking on: May 08, 2023   pregabalin 100 MG capsule Commonly known as: LYRICA TAKE 1 CAPSULE BY MOUTH IN THE MORNING, AND AT NOON, AND 2 CAPSULES AT NIGHT What changed:  how much to take how to take this when to take this additional instructions   rosuvastatin 40 MG tablet Commonly known as: CRESTOR TAKE 1 TABLET BY MOUTH DAILY   senna-docusate  8.6-50 MG tablet Commonly known as: Senokot-S Take 1 tablet by mouth 2 (two) times daily.   TYLENOL 500 MG tablet Generic drug: acetaminophen Take 500-1,000 mg by mouth every 6 (six) hours as needed for mild pain (pain score 1-3) or headache.   Vitamin B-12 5000 MCG Tbdp Take 10,000 mcg by mouth every morning.   Vitamin D3 50 MCG (2000 UT) Tabs Take 2,000 Units by mouth daily.               Durable Medical Equipment  (From admission, onward)           Start     Ordered   05/07/23 1310  For home use only DME oxygen  Once       Question Answer Comment  Length of Need 6 Months   Mode or (Route) Nasal cannula   Liters per Minute 4   Frequency Continuous (stationary and portable oxygen unit needed)   Oxygen conserving device Yes   Oxygen delivery system Gas      05/07/23 1309   05/07/23 1034  For home use only DME Walker rolling  Once       Question Answer Comment  Walker: With 5 Inch Wheels   Patient needs a walker to treat with the following condition Generalized muscle weakness      05/07/23 1033            Follow-up Information     [facility] Follow up.   Why: [facility] Contact information: [address] [place] [address] [phone]         [facility] Follow up.   Why: [facility] Contact information: [address] [place], [place] [address]  Phone: [phone]               Allergies  Allergen Reactions   Atorvastatin Other (See Comments)    Made the hands ACHE   Cymbalta [Duloxetine Hcl] Nausea Only and Other (See Comments)    Drowsiness, also   Penicillins Hives    Consultations: Oncology  Procedures:   Discharge Exam: BP (!) 142/70   Pulse 69   Temp 97.7 F (36.5 C) (Oral)   Resp 15   Ht 6\' 1"  (1.854 m)   Wt 120.8 kg   SpO2 94%   BMI 35.14 kg/m  Physical Exam Constitutional:      General: He is not in  acute distress.  Appearance: Normal appearance.  HENT:     Head: Normocephalic and atraumatic.     Mouth/Throat:     Mouth: Mucous membranes are moist.  Eyes:     Extraocular Movements: Extraocular movements intact.  Cardiovascular:     Rate and Rhythm: Normal rate and regular rhythm.  Pulmonary:     Effort: Pulmonary effort is normal. No respiratory distress.     Breath sounds: Normal breath sounds. No wheezing.  Abdominal:     General: Bowel sounds are normal. There is no distension.     Palpations: Abdomen is soft.     Tenderness: There is no abdominal tenderness.  Musculoskeletal:        General: Swelling: trace in extremities. Normal range of motion.     Cervical back: Normal range of motion and neck supple.  Skin:    General: Skin is warm and dry.  Neurological:     General: No focal deficit present.     Mental Status: He is alert.  Psychiatric:        Mood and Affect: Mood normal.        Behavior: Behavior normal.      The results of significant diagnostics from this hospitalization (including imaging, microbiology, ancillary and laboratory) are listed below for reference.   Microbiology: Recent Results (from the past 240 hour(s))  Culture, blood (routine x 2)     Status: None (Preliminary result)   Collection Time: 05/03/23  1:32 AM   Specimen: BLOOD  Result Value Ref Range Status   Specimen Description   Final    BLOOD LEFT ANTECUBITAL Performed at [facility], [address], [place], [place] [address]    Special Requests   Final    BOTTLES DRAWN AEROBIC AND ANAEROBIC Blood Culture adequate volume Performed at [facility], [address], [place], [place] [address]    Culture   Final    NO GROWTH 4 DAYS Performed at [facility], [address] [place], [place] [address]    Report Status PENDING  Incomplete  Culture, blood (routine x 2)     Status: None (Preliminary result)   Collection Time: 05/03/23   1:37 AM   Specimen: BLOOD  Result Value Ref Range Status   Specimen Description   Final    BLOOD RIGHT ANTECUBITAL Performed at [facility], [address] [place], [place] [address]    Special Requests   Final    BOTTLES DRAWN AEROBIC AND ANAEROBIC Blood Culture adequate volume Performed at [facility], [address], [place], [place] [address]    Culture   Final    NO GROWTH 4 DAYS Performed at [facility], [address] [place], [place] [address]    Report Status PENDING  Incomplete     Labs: BNP (last 3 results) No results for input(s): "BNP" in the last 8760 hours. Basic Metabolic Panel: Recent Labs  Lab 05/03/23 1622 05/04/23 0557 05/04/23 1713 05/05/23 0557 05/05/23 1700 05/06/23 0711 05/07/23 0619  NA 137   < > 137 142 144 142 142  K 3.3*   < > 3.6 3.4* 3.6 3.3* 3.8  CL 107   < > 105 109 111 110 112*  CO2 26   < > 24 21* 22 23 21*  GLUCOSE 110*   < > 172* 168* 124* 98 87  BUN 40*   < > 40* 54* 58* 59* 58*  CREATININE 1.65*   < > 2.25* 2.79*  2.74* 2.84* 2.79*  CALCIUM 12.1*   < > 12.1* 12.1* 11.5* 10.5* 9.6  MG  --   --   --  1.9  --  2.0 2.0  PHOS 4.3  --  3.9 4.5 3.8  --   --    < > = values in this interval not displayed.   Liver Function Tests: Recent Labs  Lab 05/03/23 1622 05/04/23 1713 05/05/23 0557 05/05/23 1700 05/06/23 0711 05/07/23 0619  AST 14*  --   --   --  20 22  ALT 15  --   --   --  17 21  ALKPHOS 67  --   --   --  73 78  BILITOT 0.4  --   --   --  0.4 0.3  PROT 7.9  --   --   --  7.6 7.3  ALBUMIN 2.5* 2.4* 2.4* 2.3* 2.4* 2.2*   No results for input(s): "LIPASE", "AMYLASE" in the last 168 hours. No results for input(s): "AMMONIA" in the last 168 hours. CBC: Recent Labs  Lab 05/04/23 1713 05/05/23 0557 05/05/23 1700 05/06/23 0711 05/07/23 0619 05/07/23 1548  WBC 7.0 8.4 11.5* 9.8 6.8  --   NEUTROABS 6.3 7.5 10.0* 7.7 4.6  --   HGB 8.0* 7.9* 7.5* 7.6* 7.7* 9.2*  HCT  26.4* 25.1* 23.6* 24.9* 25.2* 28.9*  MCV 93.6 90.9 90.8 92.9 94.0  --   PLT 186 188 170 178 165  --    Cardiac Enzymes: No results for input(s): "CKTOTAL", "CKMB", "CKMBINDEX", "TROPONINI" in the last 168 hours. BNP: Invalid input(s): "POCBNP" CBG: No results for input(s): "GLUCAP" in the last 168 hours. D-Dimer No results for input(s): "DDIMER" in the last 72 hours. Hgb A1c No results for input(s): "HGBA1C" in the last 72 hours. Lipid Profile No results for input(s): "CHOL", "HDL", "LDLCALC", "TRIG", "CHOLHDL", "LDLDIRECT" in the last 72 hours. Thyroid function studies No results for input(s): "TSH", "T4TOTAL", "T3FREE", "THYROIDAB" in the last 72 hours.  Invalid input(s): "FREET3" Anemia work up No results for input(s): "VITAMINB12", "FOLATE", "FERRITIN", "TIBC", "IRON", "RETICCTPCT" in the last 72 hours. Urinalysis    Component Value Date/Time   COLORURINE STRAW (A) 05/03/2023 1823   APPEARANCEUR CLEAR 05/03/2023 1823   LABSPEC 1.011 05/03/2023 1823   PHURINE 5.0 05/03/2023 1823   GLUCOSEU NEGATIVE 05/03/2023 1823   HGBUR SMALL (A) 05/03/2023 1823   BILIRUBINUR NEGATIVE 05/03/2023 1823   KETONESUR NEGATIVE 05/03/2023 1823   PROTEINUR NEGATIVE 05/03/2023 1823   UROBILINOGEN 1.0 04/15/2013 2256   NITRITE NEGATIVE 05/03/2023 1823   LEUKOCYTESUR NEGATIVE 05/03/2023 1823   Sepsis Labs Recent Labs  Lab 05/05/23 0557 05/05/23 1700 05/06/23 0711 05/07/23 0619  WBC 8.4 11.5* 9.8 6.8   Microbiology Recent Results (from the past 240 hour(s))  Culture, blood (routine x 2)     Status: None (Preliminary result)   Collection Time: 05/03/23  1:32 AM   Specimen: BLOOD  Result Value Ref Range Status   Specimen Description   Final    BLOOD LEFT ANTECUBITAL Performed at [facility], [address], [place], [place] [address]    Special Requests   Final    BOTTLES DRAWN AEROBIC AND ANAEROBIC Blood Culture adequate volume Performed at [facility], [address], [place], [place] [address]    Culture   Final    NO GROWTH 4 DAYS Performed at [facility], [address], [place], [place] [address]    Report Status PENDING  Incomplete  Culture, blood (routine x 2)     Status: None (Preliminary result)   Collection Time: 05/03/23  1:37 AM   Specimen: BLOOD  Result Value Ref Range Status   Specimen Description   Final    BLOOD RIGHT ANTECUBITAL Performed at [facility], [address], [place], [place] [address]    Special Requests   Final    BOTTLES DRAWN AEROBIC AND ANAEROBIC Blood Culture adequate volume Performed at [facility], [address], [place], [place] [address]    Culture   Final    NO GROWTH 4 DAYS Performed at [facility], [address] [place], [place] [address]    Report Status PENDING  Incomplete    Procedures/Studies: US RENAL  Result Date: 05/04/2023 CLINICAL DATA:  Acute kidney injury EXAM: RENAL / URINARY TRACT ULTRASOUND COMPLETE COMPARISON:  PET-CT 04/22/2023 FINDINGS: Right Kidney: Renal measurements: 14.1 x 6 x 6.7 cm = volume: 301 mL. Echogenicity within normal limits. No mass or hydronephrosis visualized. Left Kidney: Renal measurements: 14.1 x 6 x 6.6 cm = volume: 288 mL. Echogenicity within normal limits. No mass or hydronephrosis visualized. 4 mm echogenic focus demonstrated in the lower pole consistent with a stone. Bladder: Appears normal for degree of bladder distention. Other: None. IMPRESSION: 1. 4 mm nonobstructing stone in the lower pole left kidney. 2. No hydronephrosis or solid mass in either kidney. Electronically Signed   By: Burman Nieves M.D.   On: 05/04/2023 23:49   NM PET Image Initial (PI) Whole Body (F-18 FDG)  Result Date: 05/03/2023 CLINICAL DATA:  Subsequent treatment strategy for multiple myeloma. EXAM: NUCLEAR MEDICINE PET WHOLE BODY TECHNIQUE: 13 mCi F-18 FDG was injected intravenously. Full-ring PET  imaging was performed from the head to foot after the radiotracer. CT data was obtained and used for attenuation correction and anatomic localization. Fasting blood glucose: 114 mg/dl COMPARISON:  None Available. FINDINGS: Mediastinal blood pool activity: SUV max 2.0 HEAD/NECK: No hypermetabolic activity in the scalp. No hypermetabolic cervical lymph nodes. Incidental CT findings: Is CHEST: No hypermetabolic mediastinal or hilar nodes. No suspicious pulmonary nodules on the CT scan. Incidental CT findings: none ABDOMEN/PELVIS: No abnormal hypermetabolic activity within the liver, pancreas, adrenal glands, or spleen. No hypermetabolic lymph nodes in the abdomen or pelvis. Incidental CT findings: none SKELETON: There multiple foci focal radiotracer activity within the skeleton. Lesions are accompaniedlytic change on CT portion exam. There approximately 50 lesions. For example lesion the posterior RIGHT fifth rib with SUV max equal 3.6 on image 120. There is a lucent lesion and pathologic fracture associated with hypermetabolic activity the posterior LEFT sixth rib with SUV max equal 6.4 on image 129. Multiple lesions throughout the spine. Most intense lesion spinal lesion has associated soft tissue expansion at the the T2 vertebral body measuring 2.5 cm (107) with SUV max equal 7.3 Lesion in the inferior LEFT pubic rami subtle lucency on image 299. Suspicious lesions in LEFT and RIGHT femoral necks. Incidental CT findings: none EXTREMITIES: Hypermetabolic lesions RIGHT humeral shaft. Lesion the proximal LEFT and RIGHT femurs. Incidental CT findings: none IMPRESSION: 1. Multiple hypermetabolic lytic lesions throughout the skeleton consistent with multiple myeloma. Approximately 50 lesions within the axillary and appendicular skeleton. Many lesions accompanied lytic/lucent lesions on CT. 2. No evidence of soft tissue plasmacytoma. Electronically Signed   By: Genevive Bi M.D.   On: 05/03/2023 15:16   CT Chest W  Contrast  Result Date: 05/03/2023 CLINICAL DATA:  Pneumonia with complication  suspected. X-ray done. Cough with rib pain after coughing. Shortness of breath and right-sided pain. History of multiple myeloma. EXAM: CT CHEST WITH CONTRAST TECHNIQUE: Multidetector CT imaging of the chest was performed during intravenous contrast administration. RADIATION DOSE REDUCTION: This exam was performed according to the departmental dose-optimization program which includes automated exposure control, adjustment of the mA and/or kV according to patient size and/or use of iterative reconstruction technique. CONTRAST:  60mL OMNIPAQUE IOHEXOL 300 MG/ML  SOLN COMPARISON:  Chest radiograph 05/02/2023.  PET-CT 04/22/2023 FINDINGS: Cardiovascular: Mild cardiac enlargement. No pericardial effusions. Normal caliber thoracic aorta. Calcification of the aorta and coronary arteries. Mediastinum/Nodes: Thyroid gland is unremarkable. Esophagus is decompressed. Scattered mediastinal lymph nodes are not pathologically enlarged. Lungs/Pleura: Motion artifact limits examination. There is evidence of atelectasis or consolidation in both lung bases. Small bilateral pleural effusions. Changes could represent pneumonia. No pneumothorax. Upper Abdomen: No acute abnormalities. Musculoskeletal: Degenerative changes in the spine and shoulders. Multiple lucent lesions demonstrated in the ribs, spine, and shoulders consistent with history of myeloma. Fractures of the right lateral fifth, sixth, and seventh ribs as well as the right tenth rib. Fractures of the left third, fourth, and sixth ribs. No vertebral compression deformities are demonstrated although large lucent lesions are demonstrated in the T1 and T2 vertebrae. IMPRESSION: 1. Multiple lucent lesions demonstrated in the ribs, spine, and shoulders corresponding to the patient's history of multiple myeloma. 2. Multiple bilateral acute rib fractures. 3. Small bilateral pleural effusions with basilar  atelectasis or consolidation, possibly pneumonia. Electronically Signed   By: Burman Nieves M.D.   On: 05/03/2023 01:21   DG Chest Port 1 View  Result Date: 05/02/2023 CLINICAL DATA:  Shortness of breath and cough. History of multiple myeloma. Right-sided pain. EXAM: PORTABLE CHEST 1 VIEW COMPARISON:  04/20/2023 FINDINGS: Shallow inspiration. Cardiac enlargement. No vascular congestion or edema. Linear atelectasis in the lung bases. No pleural effusions. No pneumothorax. Calcification of the aorta. A nondisplaced right lateral ninth rib fracture is suggested. IMPRESSION: Shallow inspiration with atelectasis in the lung bases. Cardiac enlargement. Probable right ninth rib fracture. Electronically Signed   By: Burman Nieves M.D.   On: 05/02/2023 23:45   CT BONE MARROW BIOPSY & ASPIRATION  Result Date: 04/27/2023 INDICATION: myeloma staging EXAM: CT GUIDED BONE MARROW ASPIRATION AND CORE BIOPSY MEDICATIONS: None. ANESTHESIA/SEDATION: Moderate (conscious) sedation was employed during this procedure. A total of Versed 2 mg and Fentanyl 100 mcg was administered intravenously. Moderate Sedation Time: 13 minutes. The patient's level of consciousness and vital signs were monitored continuously by radiology nursing throughout the procedure under my direct supervision. FLUOROSCOPY TIME:  CT dose; 122 mGycm COMPLICATIONS: None immediate. Estimated blood loss: <5 mL PROCEDURE: RADIATION DOSE REDUCTION: This exam was performed according to the departmental dose-optimization program which includes automated exposure control, adjustment of the mA and/or kV according to patient size and/or use of iterative reconstruction technique. Informed written consent was obtained from the patient after a thorough discussion of the procedural risks, benefits and alternatives. All questions were addressed. Maximal Sterile Barrier Technique was utilized including caps, mask, sterile gowns, sterile gloves, sterile drape, hand  hygiene and skin antiseptic. A timeout was performed prior to the initiation of the procedure. The patient was positioned prone and non-contrast localization CT was performed of the pelvis to demonstrate the iliac marrow spaces. Maximal barrier sterile technique utilized including caps, mask, sterile gowns, sterile gloves, large sterile drape, hand hygiene, and chlorhexidine prep. Under sterile conditions and local anesthesia, an 11 gauge coaxial  bone biopsy needle was advanced into the RIGHT iliac marrow space. Needle position was confirmed with CT imaging. Initially, bone marrow aspiration was performed. Next, the 11 gauge outer cannula was utilized to obtain a 1 iliac bone marrow core biopsy. Needle was removed. Hemostasis was obtained with compression. The patient tolerated the procedure well. Samples were prepared with the cytotechnologist. IMPRESSION: Successful CT-guided bone marrow aspiration and biopsy. Roanna Banning, MD [facility] Electronically Signed   By: Roanna Banning M.D.   On: 04/27/2023 12:32   DG Chest 2 View  Result Date: 04/23/2023 CLINICAL DATA:  Cough for 2 months Pain between shoulder blades EXAM: CHEST - 2 VIEW COMPARISON:  08/24/2019 FINDINGS: Cardiomediastinal silhouette and pulmonary vasculature are within normal limits. Lungs are hyperexpanded but otherwise clear. Unchanged dextroconvex curvature of the thoracolumbar spine. IMPRESSION: 1. No acute cardiopulmonary process. 2. Hyperexpanded lungs, suspicious for emphysema. Electronically Signed   By: Acquanetta Belling M.D.   On: 04/23/2023 16:44     Time coordinating discharge: Over 30 minutes    Daniel Chamber, MD  [facility] 05/07/2023, 4:47 PM

## 2023-05-07 NOTE — Progress Notes (Signed)
Pharmacist Chemotherapy Monitoring - Initial Assessment    Anticipated start date: 05/14/23   The following has been reviewed per standard work regarding the patient's treatment regimen: The patient's diagnosis, treatment plan and drug doses, and organ/hematologic function Lab orders and baseline tests specific to treatment regimen  The treatment plan start date, drug sequencing, and pre-medications Prior authorization status  Patient's documented medication list, including drug-drug interaction screen and prescriptions for anti-emetics and supportive care specific to the treatment regimen The drug concentrations, fluid compatibility, administration routes, and timing of the medications to be used The patient's access for treatment and lifetime cumulative dose history, if applicable  The patient's medication allergies and previous infusion related reactions, if applicable   Changes made to treatment plan:  N/A  Follow up needed:  Pending authorization for treatment    Drusilla Kanner, PharmD, MBA

## 2023-05-08 LAB — BPAM RBC
Blood Product Expiration Date: 202412272359
Blood Product Expiration Date: 202412312359
ISSUE DATE / TIME: 202412051127
ISSUE DATE / TIME: 202412061202
Unit Type and Rh: 5100
Unit Type and Rh: 5100

## 2023-05-08 LAB — CULTURE, BLOOD (ROUTINE X 2)
Culture: NO GROWTH
Culture: NO GROWTH
Special Requests: ADEQUATE
Special Requests: ADEQUATE

## 2023-05-08 LAB — TYPE AND SCREEN
ABO/RH(D): O POS
Antibody Screen: NEGATIVE
Unit division: 0
Unit division: 0

## 2023-05-09 NOTE — Progress Notes (Unsigned)
  Cardiology Office Note:   Date:  05/10/2023  ID:  Daniel Whitaker, DOB Nov 30, 1945, MRN [id number] PCP: [name], MD  [facility] Providers Cardiologist:  [name], MD {  History of Present Illness:   Daniel Whitaker is a 77 y.o. who presents for follow up of CAD, hyperlipidemia and obesity.  He was seen for dyspnea on exertion.  He had shortness of breath thought to be secondary to obesity and deconditioning.  Exercise treadmill test was ordered and showed ST depression and frequent PVCs.  Left heart cath in May 2023 showed 85% prox LAD disease - DES was placed with 0% residual stenosis.     Since I last saw him he is being managed for multiple myeloma.  He was in the hospital with hypercalcemia and AKI.  He is going to see his oncologist tomorrow and again talk about starting treatment.  He had no new cardiovascular complaints.  Right now because of rib fractures and pain he is getting around with a walker but he is doing all of his self-care.  He is actually staying with his son to avoid stairs.  The patient denies any new symptoms such as chest discomfort, neck or arm discomfort. There has been no new shortness of breath, PND or orthopnea. There have been no reported palpitations, presyncope or syncope.    ROS: As stated in the HPI and negative for all other systems.  Studies Reviewed:    EKG:   05/03/2023 normal sinus rhythm, rate 78, axis within normal limits, intervals within normal limits, no acute ST-T wave changes.  Risk Assessment/Calculations:      Physical Exam:   VS:  BP (!) 158/76 (BP Location: Left Arm, Patient Position: Sitting, Cuff Size: Normal)   Pulse 69   Ht 6\' 1"  (1.854 m)   Wt 262 lb (118.8 kg)   SpO2 (!) 88%   BMI 34.57 kg/m    Wt Readings from Last 3 Encounters:  05/10/23 262 lb (118.8 kg)  05/07/23 266 lb 5.1 oz (120.8 kg)  04/27/23 238 lb (108 kg)     GEN: Well nourished, well developed in no acute distress NECK: No JVD; No  carotid bruits CARDIAC: RRR, no murmurs, rubs, gallops RESPIRATORY:  Clear to auscultation without rales, wheezing or rhonchi  ABDOMEN: Soft, non-tender, non-distended EXTREMITIES:  Mild leg edema; No deformity   ASSESSMENT AND PLAN:   Coronary artery disease:   The patient has no new sypmtoms.  No further cardiovascular testing is indicated.  We will continue with aggressive risk reduction.  We can discontinue his Plavix as it has been over a year.    Hypertension:   The blood pressure is mildly elevated but he is going to get this checked frequently during the course of his upcoming therapy.  At this point no med changes.   Hyperlipidemia:    LDL is 44.  No change in therapy.  Follow up as above.   Signed, [name], MD

## 2023-05-10 ENCOUNTER — Encounter (HOSPITAL_COMMUNITY): Payer: Self-pay | Admitting: Hematology and Oncology

## 2023-05-10 ENCOUNTER — Telehealth: Payer: Self-pay

## 2023-05-10 ENCOUNTER — Encounter: Payer: Self-pay | Admitting: Cardiology

## 2023-05-10 ENCOUNTER — Other Ambulatory Visit: Payer: Self-pay | Admitting: Hematology and Oncology

## 2023-05-10 ENCOUNTER — Ambulatory Visit: Payer: Medicare Other | Attending: Cardiology | Admitting: Cardiology

## 2023-05-10 VITALS — BP 158/76 | HR 69 | Ht 73.0 in | Wt 262.0 lb

## 2023-05-10 DIAGNOSIS — I10 Essential (primary) hypertension: Secondary | ICD-10-CM | POA: Diagnosis not present

## 2023-05-10 DIAGNOSIS — I2510 Atherosclerotic heart disease of native coronary artery without angina pectoris: Secondary | ICD-10-CM

## 2023-05-10 DIAGNOSIS — E785 Hyperlipidemia, unspecified: Secondary | ICD-10-CM | POA: Diagnosis not present

## 2023-05-10 DIAGNOSIS — C9000 Multiple myeloma not having achieved remission: Secondary | ICD-10-CM

## 2023-05-10 NOTE — Telephone Encounter (Signed)
Returned call to [facility] [employer] after getting a call from Roland, granddaughter. Spoke with both of them on the phone. They are need a order for portable oxygen tank from recent hospitalization.  Told them both that Dr. Bertis Ruddy does not sign orders for oxygen.  Granddaughter verbalized understanding and will contact PCP.

## 2023-05-10 NOTE — Patient Instructions (Signed)
Medication Instructions:  Stop taking Plavix. *If you need a refill on your cardiac medications before your next appointment, please call your pharmacy*   Follow-Up: At [facility], you and your health needs are our priority.  As part of our continuing mission to provide you with exceptional heart care, we have created designated Provider Care Teams.  These Care Teams include your primary Cardiologist (physician) and Advanced Practice Providers (APPs -  Physician Assistants and Nurse Practitioners) who all work together to provide you with the care you need, when you need it.  We recommend signing up for the patient portal called "MyChart".  Sign up information is provided on this After Visit Summary.  MyChart is used to connect with patients for Virtual Visits (Telemedicine).  Patients are able to view lab/test results, encounter notes, upcoming appointments, etc.  Non-urgent messages can be sent to your provider as well.   To learn more about what you can do with MyChart, go to [email or web address].    Your next appointment:   12 month(s)  Provider:   [name], MD

## 2023-05-11 ENCOUNTER — Inpatient Hospital Stay: Payer: Medicare Other | Attending: Hematology and Oncology

## 2023-05-11 ENCOUNTER — Encounter: Payer: Self-pay | Admitting: Hematology and Oncology

## 2023-05-11 ENCOUNTER — Inpatient Hospital Stay: Payer: Medicare Other

## 2023-05-11 ENCOUNTER — Inpatient Hospital Stay: Payer: Medicare Other | Admitting: Hematology and Oncology

## 2023-05-11 ENCOUNTER — Encounter (HOSPITAL_COMMUNITY): Payer: Self-pay | Admitting: Hematology and Oncology

## 2023-05-11 VITALS — BP 149/95 | HR 72 | Temp 98.6°F | Resp 18 | Ht 73.0 in | Wt 260.0 lb

## 2023-05-11 DIAGNOSIS — Z5112 Encounter for antineoplastic immunotherapy: Secondary | ICD-10-CM | POA: Insufficient documentation

## 2023-05-11 DIAGNOSIS — G629 Polyneuropathy, unspecified: Secondary | ICD-10-CM

## 2023-05-11 DIAGNOSIS — Z79899 Other long term (current) drug therapy: Secondary | ICD-10-CM | POA: Insufficient documentation

## 2023-05-11 DIAGNOSIS — J189 Pneumonia, unspecified organism: Secondary | ICD-10-CM | POA: Diagnosis not present

## 2023-05-11 DIAGNOSIS — C9000 Multiple myeloma not having achieved remission: Secondary | ICD-10-CM | POA: Diagnosis not present

## 2023-05-11 DIAGNOSIS — D539 Nutritional anemia, unspecified: Secondary | ICD-10-CM

## 2023-05-11 DIAGNOSIS — Z87891 Personal history of nicotine dependence: Secondary | ICD-10-CM | POA: Diagnosis not present

## 2023-05-11 DIAGNOSIS — D472 Monoclonal gammopathy: Secondary | ICD-10-CM | POA: Insufficient documentation

## 2023-05-11 LAB — CMP (CANCER CENTER ONLY)
ALT: 24 U/L (ref 0–44)
AST: 22 U/L (ref 15–41)
Albumin: 3 g/dL — ABNORMAL LOW (ref 3.5–5.0)
Alkaline Phosphatase: 97 U/L (ref 38–126)
Anion gap: 8 (ref 5–15)
BUN: 48 mg/dL — ABNORMAL HIGH (ref 8–23)
CO2: 26 mmol/L (ref 22–32)
Calcium: 8.8 mg/dL — ABNORMAL LOW (ref 8.9–10.3)
Chloride: 104 mmol/L (ref 98–111)
Creatinine: 1.35 mg/dL — ABNORMAL HIGH (ref 0.61–1.24)
GFR, Estimated: 54 mL/min — ABNORMAL LOW (ref >60)
Glucose, Bld: 145 mg/dL — ABNORMAL HIGH (ref 70–99)
Potassium: 4.4 mmol/L (ref 3.5–5.1)
Sodium: 138 mmol/L (ref 135–145)
Total Bilirubin: 0.3 mg/dL (ref <1.2)
Total Protein: 8.7 g/dL — ABNORMAL HIGH (ref 6.5–8.1)

## 2023-05-11 LAB — CBC WITH DIFFERENTIAL (CANCER CENTER ONLY)
Abs Immature Granulocytes: 0.27 10*3/uL — ABNORMAL HIGH (ref 0.00–0.07)
Basophils Absolute: 0 10*3/uL (ref 0.0–0.1)
Basophils Relative: 0 %
Eosinophils Absolute: 0 10*3/uL (ref 0.0–0.5)
Eosinophils Relative: 0 %
HCT: 27.3 % — ABNORMAL LOW (ref 39.0–52.0)
Hemoglobin: 8.9 g/dL — ABNORMAL LOW (ref 13.0–17.0)
Immature Granulocytes: 3 %
Lymphocytes Relative: 10 %
Lymphs Abs: 1.1 10*3/uL (ref 0.7–4.0)
MCH: 29 pg (ref 26.0–34.0)
MCHC: 32.6 g/dL (ref 30.0–36.0)
MCV: 88.9 fL (ref 80.0–100.0)
Monocytes Absolute: 0.7 10*3/uL (ref 0.1–1.0)
Monocytes Relative: 6 %
Neutro Abs: 8.8 10*3/uL — ABNORMAL HIGH (ref 1.7–7.7)
Neutrophils Relative %: 81 %
Platelet Count: 215 10*3/uL (ref 150–400)
RBC: 3.07 MIL/uL — ABNORMAL LOW (ref 4.22–5.81)
RDW: 15.5 % (ref 11.5–15.5)
WBC Count: 10.9 10*3/uL — ABNORMAL HIGH (ref 4.0–10.5)
nRBC: 0 % (ref 0.0–0.2)

## 2023-05-11 LAB — IRON AND IRON BINDING CAPACITY (CC-WL,HP ONLY)
Iron: 55 ug/dL (ref 45–182)
Saturation Ratios: 19 % (ref 17.9–39.5)
TIBC: 288 ug/dL (ref 250–450)
UIBC: 233 ug/dL (ref 117–376)

## 2023-05-11 LAB — VITAMIN B12: Vitamin B-12: 5693 pg/mL — ABNORMAL HIGH (ref 180–914)

## 2023-05-11 LAB — URIC ACID: Uric Acid, Serum: 3.9 mg/dL (ref 3.7–8.6)

## 2023-05-11 LAB — TYPE AND SCREEN
ABO/RH(D): O POS
Antibody Screen: NEGATIVE

## 2023-05-11 LAB — FERRITIN: Ferritin: 119 ng/mL (ref 24–336)

## 2023-05-11 LAB — PRETREATMENT RBC PHENOTYPE

## 2023-05-11 MED ORDER — ONDANSETRON HCL 8 MG PO TABS: 8.0000 mg | ORAL_TABLET | Freq: Three times a day (TID) | ORAL | 1 refills | Status: DC | PRN

## 2023-05-11 MED ORDER — PROCHLORPERAZINE MALEATE 10 MG PO TABS: 10.0000 mg | ORAL_TABLET | Freq: Four times a day (QID) | ORAL | 1 refills | Status: DC | PRN

## 2023-05-11 MED ORDER — ALLOPURINOL 300 MG PO TABS: 300.0000 mg | ORAL_TABLET | Freq: Every day | ORAL | 0 refills | Status: DC

## 2023-05-11 MED ORDER — DEXAMETHASONE 4 MG PO TABS: 4.0000 mg | ORAL_TABLET | Freq: Every morning | ORAL | Status: DC

## 2023-05-11 MED ORDER — ACYCLOVIR 400 MG PO TABS
400.0000 mg | ORAL_TABLET | Freq: Every day | ORAL | 6 refills | Status: DC
Start: 2023-05-11 — End: 2023-06-09

## 2023-05-11 MED ORDER — FUROSEMIDE 20 MG PO TABS
20.0000 mg | ORAL_TABLET | Freq: Every day | ORAL | 1 refills | Status: DC
Start: 2023-05-11 — End: 2023-05-21

## 2023-05-11 NOTE — Assessment & Plan Note (Signed)
He has pre-existing neuropathy prior to treatment He will get reduced dose bortezomib

## 2023-05-11 NOTE — Assessment & Plan Note (Signed)
This has improved back to normal He can stop calcitonin I will reduce the dose of his dexamethasone He will can reduce his oral fluid intake He has gained a lot of fluid weight and I will start him on low-dose furosemide

## 2023-05-11 NOTE — Progress Notes (Signed)
[facility] OFFICE PROGRESS NOTE  Patient Care Team: [name], MD as PCP - General (Family Medicine) [name], MD as PCP - Cardiology (Cardiology)  ASSESSMENT & PLAN:  Multiple myeloma without remission [facility]) He has amazing improvement of his tests since discharge from the hospital His calcium level has normalized His is still anemic but has improvement of his renal function He will complete chemo education class today and he will start first dose of chemotherapy on Friday He will continue dexamethasone daily and take pulsed dexamethasone on Fridays He will start lenalidomide on Friday He will take aspirin for DVT prophylaxis I will start him on acyclovir for antimicrobial prophylaxis He will continue vitamin D supplement He had received pamidronate in the hospital and I plan to give him Zometa next month after dental clearance I will see him next week for further follow-up  Neuropathy He has pre-existing neuropathy prior to treatment He will get reduced dose bortezomib  Deficiency anemia The cause of his anemia is multifactorial He is not symptomatic Observe closely for now  Hypercalcemia This has improved back to normal He can stop calcitonin I will reduce the dose of his dexamethasone He will can reduce his oral fluid intake He has gained a lot of fluid weight and I will start him on low-dose furosemide  No orders of the defined types were placed in this encounter.   All questions were answered. The patient knows to call the clinic with any problems, questions or concerns. The total time spent in the appointment was 80 minutes encounter with patients including review of chart and various tests results, discussions about plan of care and coordination of care plan   [name], MD 05/11/2023 4:03 PM  INTERVAL HISTORY: Please see below for problem oriented charting. he returns for follow-up after recent hospitalization We discussed test  results He felt much better His pain is much reduced and he is only taking oxycodone 2-3 times a day at most He denies constipation His oral intake has improved He has gained a lot of weight since her last visit Denies worsening peripheral neuropathy No shortness of breath His breathing has improved.  He has occasional cough We discussed side effects of chemotherapy and neutropenic precaution  REVIEW OF SYSTEMS:   Constitutional: Denies fevers, chills or abnormal weight loss Eyes: Denies blurriness of vision Ears, nose, mouth, throat, and face: Denies mucositis or sore throat Cardiovascular: Denies palpitation, chest discomfort  Gastrointestinal:  Denies nausea, heartburn or change in bowel habits Skin: Denies abnormal skin rashes Lymphatics: Denies new lymphadenopathy or easy bruising Behavioral/Psych: Mood is stable, no new changes  All other systems were reviewed with the patient and are negative.  I have reviewed the past medical history, past surgical history, social history and family history with the patient and they are unchanged from previous note.  ALLERGIES:  is allergic to atorvastatin, cymbalta [duloxetine hcl], and penicillins.  MEDICATIONS:  Current Outpatient Medications  Medication Sig Dispense Refill   acyclovir (ZOVIRAX) 400 MG tablet Take 1 tablet (400 mg total) by mouth daily. 30 tablet 6   allopurinol (ZYLOPRIM) 300 MG tablet Take 1 tablet (300 mg total) by mouth daily. 30 tablet 0   furosemide (LASIX) 20 MG tablet Take 1 tablet (20 mg total) by mouth daily. 30 tablet 1   albuterol (VENTOLIN HFA) 108 (90 Base) MCG/ACT inhaler Inhale 1-2 puffs into the lungs every 6 (six) hours as needed for wheezing or shortness of breath.  aspirin EC 81 MG tablet Take 1 tablet (81 mg total) by mouth daily. Swallow whole. 30 tablet 0   brimonidine (ALPHAGAN) 0.2 % ophthalmic solution Place 1 drop into the left eye in the morning and at bedtime.     carvedilol (COREG) 3.125  MG tablet Take 1 tablet (3.125 mg total) by mouth 2 (two) times daily. 180 tablet 3   cetirizine (ZYRTEC) 10 MG tablet Take 10 mg by mouth every morning.     Cholecalciferol (VITAMIN D3) 50 MCG (2000 UT) TABS Take 2,000 Units by mouth daily.     Cyanocobalamin (VITAMIN B-12) 5000 MCG TBDP Take 10,000 mcg by mouth every morning.     cyclobenzaprine (FLEXERIL) 10 MG tablet Take 10 mg by mouth 3 (three) times daily as needed for muscle spasms.     dexamethasone (DECADRON) 4 MG tablet Take 1 tablet (4 mg total) by mouth in the morning. Take with food     docusate sodium (COLACE) 100 MG capsule Take 100-200 mg by mouth 2 (two) times daily as needed for mild constipation.     dorzolamide-timolol (COSOPT) 22.3-6.8 MG/ML ophthalmic solution Place 1 drop into both eyes 2 (two) times daily.     ketoconazole (NIZORAL) 2 % cream Apply 1 application  topically daily as needed for irritation.     latanoprost (XALATAN) 0.005 % ophthalmic solution Place 1 drop into both eyes at bedtime.     lenalidomide (REVLIMID) 10 MG capsule Take 1 capsule (10 mg total) by mouth daily for 14 days. Take for 14 days on, 7 days off, repeat every 21 days x 6. [employer] Auth #   [id number],  Date Obtained 05/06/23 14 capsule 0   lidocaine (XYLOCAINE) 5 % ointment Apply 1 Application topically 2 (two) times daily as needed. (Patient taking differently: Apply 1 Application topically 2 (two) times daily as needed (for foot pain).) 35.44 g 1   magnesium oxide (MAG-OX) 400 (240 Mg) MG tablet Take 400 mg by mouth daily.     Multiple Vitamins-Minerals (CENTRUM SILVER 50+MEN) TABS Take 1 tablet by mouth daily with breakfast.     nitroGLYCERIN (NITROSTAT) 0.4 MG SL tablet Place 1 tablet (0.4 mg total) under the tongue every 5 (five) minutes as needed. (Patient taking differently: Place 0.4 mg under the tongue every 5 (five) minutes as needed for chest pain.) 25 tablet 2   Omega-3 Fatty Acids (FISH OIL) 1200 MG CAPS Take 3,600 mg by mouth daily. 3      oxyCODONE (OXY IR/ROXICODONE) 5 MG immediate release tablet Take 1 tablet (5 mg total) by mouth every 4 (four) hours as needed for severe pain (pain score 7-10). 60 tablet 0   pantoprazole (PROTONIX) 40 MG tablet Take 40 mg by mouth daily before breakfast.     polyethylene glycol (MIRALAX / GLYCOLAX) 17 g packet Take 17 g by mouth daily.     pregabalin (LYRICA) 100 MG capsule TAKE 1 CAPSULE BY MOUTH IN THE MORNING, AND AT NOON, AND 2 CAPSULES AT NIGHT (Patient taking differently: Take 100 mg by mouth in the morning and at bedtime.) 120 capsule 5   rosuvastatin (CRESTOR) 40 MG tablet TAKE 1 TABLET BY MOUTH DAILY 90 tablet 0   senna-docusate (SENOKOT-S) 8.6-50 MG tablet Take 1 tablet by mouth 2 (two) times daily.     TYLENOL 500 MG tablet Take 500-1,000 mg by mouth every 6 (six) hours as needed for mild pain (pain score 1-3) or headache.     No current facility-administered medications  for this visit.    SUMMARY OF ONCOLOGIC HISTORY: Oncology History Overview Note  Myeloma FISH was neg Normal cytogenetics   Multiple myeloma without remission (HCC)  07/11/2013 Initial Diagnosis   Multiple myeloma without remission (HCC)   04/22/2023 PET scan   NM PET Image Initial (PI) Whole Body (F-18 FDG)  Result Date: 05/03/2023 CLINICAL DATA:  Subsequent treatment strategy for multiple myeloma. EXAM: NUCLEAR MEDICINE PET WHOLE BODY TECHNIQUE: 13 mCi F-18 FDG was injected intravenously. Full-ring PET imaging was performed from the head to foot after the radiotracer. CT data was obtained and used for attenuation correction and anatomic localization. Fasting blood glucose: 114 mg/dl COMPARISON:  None Available. FINDINGS: Mediastinal blood pool activity: SUV max 2.0 HEAD/NECK: No hypermetabolic activity in the scalp. No hypermetabolic cervical lymph nodes. Incidental CT findings: Is CHEST: No hypermetabolic mediastinal or hilar nodes. No suspicious pulmonary nodules on the CT scan. Incidental CT findings: none  ABDOMEN/PELVIS: No abnormal hypermetabolic activity within the liver, pancreas, adrenal glands, or spleen. No hypermetabolic lymph nodes in the abdomen or pelvis. Incidental CT findings: none SKELETON: There multiple foci focal radiotracer activity within the skeleton. Lesions are accompaniedlytic change on CT portion exam. There approximately 50 lesions. For example lesion the posterior RIGHT fifth rib with SUV max equal 3.6 on image 120. There is a lucent lesion and pathologic fracture associated with hypermetabolic activity the posterior LEFT sixth rib with SUV max equal 6.4 on image 129. Multiple lesions throughout the spine. Most intense lesion spinal lesion has associated soft tissue expansion at the the T2 vertebral body measuring 2.5 cm (107) with SUV max equal 7.3 Lesion in the inferior LEFT pubic rami subtle lucency on image 299. Suspicious lesions in LEFT and RIGHT femoral necks. Incidental CT findings: none EXTREMITIES: Hypermetabolic lesions RIGHT humeral shaft. Lesion the proximal LEFT and RIGHT femurs. Incidental CT findings: none IMPRESSION: 1. Multiple hypermetabolic lytic lesions throughout the skeleton consistent with multiple myeloma. Approximately 50 lesions within the axillary and appendicular skeleton. Many lesions accompanied lytic/lucent lesions on CT. 2. No evidence of soft tissue plasmacytoma. Electronically Signed   By: Genevive Bi M.D.   On: 05/03/2023 15:16   CT Chest W Contrast  Result Date: 05/03/2023 CLINICAL DATA:  Pneumonia with complication suspected. X-ray done. Cough with rib pain after coughing. Shortness of breath and right-sided pain. History of multiple myeloma. EXAM: CT CHEST WITH CONTRAST TECHNIQUE: Multidetector CT imaging of the chest was performed during intravenous contrast administration. RADIATION DOSE REDUCTION: This exam was performed according to the departmental dose-optimization program which includes automated exposure control, adjustment of the mA  and/or kV according to patient size and/or use of iterative reconstruction technique. CONTRAST:  60mL OMNIPAQUE IOHEXOL 300 MG/ML  SOLN COMPARISON:  Chest radiograph 05/02/2023.  PET-CT 04/22/2023 FINDINGS: Cardiovascular: Mild cardiac enlargement. No pericardial effusions. Normal caliber thoracic aorta. Calcification of the aorta and coronary arteries. Mediastinum/Nodes: Thyroid gland is unremarkable. Esophagus is decompressed. Scattered mediastinal lymph nodes are not pathologically enlarged. Lungs/Pleura: Motion artifact limits examination. There is evidence of atelectasis or consolidation in both lung bases. Small bilateral pleural effusions. Changes could represent pneumonia. No pneumothorax. Upper Abdomen: No acute abnormalities. Musculoskeletal: Degenerative changes in the spine and shoulders. Multiple lucent lesions demonstrated in the ribs, spine, and shoulders consistent with history of myeloma. Fractures of the right lateral fifth, sixth, and seventh ribs as well as the right tenth rib. Fractures of the left third, fourth, and sixth ribs. No vertebral compression deformities are demonstrated although large lucent lesions  are demonstrated in the T1 and T2 vertebrae. IMPRESSION: 1. Multiple lucent lesions demonstrated in the ribs, spine, and shoulders corresponding to the patient's history of multiple myeloma. 2. Multiple bilateral acute rib fractures. 3. Small bilateral pleural effusions with basilar atelectasis or consolidation, possibly pneumonia. Electronically Signed   By: Burman Nieves M.D.   On: 05/03/2023 01:21   DG Chest Port 1 View  Result Date: 05/02/2023 CLINICAL DATA:  Shortness of breath and cough. History of multiple myeloma. Right-sided pain. EXAM: PORTABLE CHEST 1 VIEW COMPARISON:  04/20/2023 FINDINGS: Shallow inspiration. Cardiac enlargement. No vascular congestion or edema. Linear atelectasis in the lung bases. No pleural effusions. No pneumothorax. Calcification of the aorta. A  nondisplaced right lateral ninth rib fracture is suggested. IMPRESSION: Shallow inspiration with atelectasis in the lung bases. Cardiac enlargement. Probable right ninth rib fracture. Electronically Signed   By: Burman Nieves M.D.   On: 05/02/2023 23:45   CT BONE MARROW BIOPSY & ASPIRATION  Result Date: 04/27/2023 INDICATION: myeloma staging EXAM: CT GUIDED BONE MARROW ASPIRATION AND CORE BIOPSY MEDICATIONS: None. ANESTHESIA/SEDATION: Moderate (conscious) sedation was employed during this procedure. A total of Versed 2 mg and Fentanyl 100 mcg was administered intravenously. Moderate Sedation Time: 13 minutes. The patient's level of consciousness and vital signs were monitored continuously by radiology nursing throughout the procedure under my direct supervision. FLUOROSCOPY TIME:  CT dose; 122 mGycm COMPLICATIONS: None immediate. Estimated blood loss: <5 mL PROCEDURE: RADIATION DOSE REDUCTION: This exam was performed according to the departmental dose-optimization program which includes automated exposure control, adjustment of the mA and/or kV according to patient size and/or use of iterative reconstruction technique. Informed written consent was obtained from the patient after a thorough discussion of the procedural risks, benefits and alternatives. All questions were addressed. Maximal Sterile Barrier Technique was utilized including caps, mask, sterile gowns, sterile gloves, sterile drape, hand hygiene and skin antiseptic. A timeout was performed prior to the initiation of the procedure. The patient was positioned prone and non-contrast localization CT was performed of the pelvis to demonstrate the iliac marrow spaces. Maximal barrier sterile technique utilized including caps, mask, sterile gowns, sterile gloves, large sterile drape, hand hygiene, and chlorhexidine prep. Under sterile conditions and local anesthesia, an 11 gauge coaxial bone biopsy needle was advanced into the RIGHT iliac marrow space.  Needle position was confirmed with CT imaging. Initially, bone marrow aspiration was performed. Next, the 11 gauge outer cannula was utilized to obtain a 1 iliac bone marrow core biopsy. Needle was removed. Hemostasis was obtained with compression. The patient tolerated the procedure well. Samples were prepared with the cytotechnologist. IMPRESSION: Successful CT-guided bone marrow aspiration and biopsy. Roanna Banning, MD [facility] Electronically Signed   By: Roanna Banning M.D.   On: 04/27/2023 12:32   DG Chest 2 View  Result Date: 04/23/2023 CLINICAL DATA:  Cough for 2 months Pain between shoulder blades EXAM: CHEST - 2 VIEW COMPARISON:  08/24/2019 FINDINGS: Cardiomediastinal silhouette and pulmonary vasculature are within normal limits. Lungs are hyperexpanded but otherwise clear. Unchanged dextroconvex curvature of the thoracolumbar spine. IMPRESSION: 1. No acute cardiopulmonary process. 2. Hyperexpanded lungs, suspicious for emphysema. Electronically Signed   By: Acquanetta Belling M.D.   On: 04/23/2023 16:44      04/27/2023 Pathology Results   Surgical Pathology CASE: [id number] PATIENT: Daniel Whitaker Bone Marrow Report  Clinical History: Myeloma  DIAGNOSIS:  BONE MARROW, ASPIRATE, CLOT, CORE: -Hypercellular bone marrow with plasma cell neoplasm -See comment  PERIPHERAL BLOOD: -Normocytic-normochromic anemia -Leukocytosis with neutrophilic left shift  COMMENT:  The bone marrow is hypercellular for age with increased number of plasma cells averaging 15% of all cells in the aspirate.  The clot and biopsy sections show interstitial infiltrates, numerous variably sized clusters in addition to an area of diffuse sheet of atypical plasma cells associated with kappa light chain restriction consistent with plasma cell neoplasm.  Correlation with cytogenetic and FISH studies is recommended.  MICROSCOPIC DESCRIPTION:  PERIPHERAL BLOOD  SMEAR: The red blood cells display mild anisopoikilocytosis with mild to moderate polychromasia.  The white blood cells are slightly increased in number, mostly with neutrophils some of which display toxic granulation.  An occasional myelocyte is seen on scan.  The platelets are normal in number.  BONE MARROW ASPIRATE: Bone marrow particles present Erythroid precursors: Orderly and progressive maturation Granulocytic precursors: Orderly and progressive maturation for the most part Megakaryocytes: Abundant with scattered small and/or hypolobated forms or large cells. Lymphocytes/plasma cells: The plasma cells are increased in number representing 15% to all cells associated with atypical cytologic features characterized by cytomegaly and/or small nucleoli.  Large lymphoid aggregates are not seen.  TOUCH PREPARATIONS: A mixture of cell types present but with increased number of plasma cells  CLOT AND BIOPSY: The sections show variable cellularity ranging from 40 to 90% focally.  There are numerous variably sized clusters of atypical plasma cells in addition to a diffuse large sheet particularly in the area of most cellularity.  The background shows trilineage hematopoiesis.  Immunohistochemical stains for CD138 in addition to in situ hybridization for kappa and lambda were performed with appropriate controls.  CD138 highlights the increased plasma cell component consisting of interstitial infiltrates, numerous variably sized clusters, and an area of diffuse sheet particularly in the core biopsy. The plasma cells display kappa light chain restriction.  IRON STAIN: Iron stains are performed on a bone marrow aspirate or touch imprint smear and section of clot. The controls stained appropriately.       Storage Iron: Decreased      Ring Sideroblasts: Absent  ADDITIONAL DATA/TESTING: The specimen was sent for cytogenetic analysis and FISH for multiple myeloma and a separate report will follow.    05/04/2023  Cancer Staging   Staging form: Plasma Cell Myeloma and Plasma Cell Disorders, AJCC 8th Edition - Clinical stage from 05/04/2023: RISS Stage II (Beta-2-microglobulin (mg/L): 5.6, Albumin (g/dL): 2.5, ISS: Stage III, High-risk cytogenetics: Absent, LDH: Normal) - Signed by [name], MD on 05/11/2023 Stage prefix: Initial diagnosis Beta 2 microglobulin range (mg/L): Greater than or equal to 5.5 Albumin range (g/dL): Less than 3.5 Cytogenetics: No abnormalities   05/14/2023 -  Chemotherapy   Patient is on Treatment Plan : MYELOMA NEWLY DIAGNOSED TRANSPLANT CANDIDATE DaraVRd (Daratumumab SQ) q21d x 6 Cycles (Induction/Consolidation)       PHYSICAL EXAMINATION: ECOG PERFORMANCE STATUS: 1 - Symptomatic but completely ambulatory  Vitals:   05/11/23 1516  BP: (!) 149/95  Pulse: 72  Resp: 18  Temp: 98.6 F (37 C)  SpO2: 92%   Filed Weights   05/11/23 1516  Weight: 260 lb (117.9 kg)    GENERAL:alert, no distress and comfortable   LABORATORY DATA:  I have reviewed the data as listed    Component Value Date/Time   NA 138 05/11/2023 1440   NA 139 01/12/2017 1011   K 4.4 05/11/2023 1440   K 4.3 01/12/2017 1011   CL 104 05/11/2023 1440   CO2 26 05/11/2023 1440  CO2 23 01/12/2017 1011   GLUCOSE 145 (H) 05/11/2023 1440   GLUCOSE 98 01/12/2017 1011   BUN 48 (H) 05/11/2023 1440   BUN 21.8 01/12/2017 1011   CREATININE 1.35 (H) 05/11/2023 1440   CREATININE 0.9 01/12/2017 1011   CALCIUM 8.8 (L) 05/11/2023 1440   CALCIUM 9.0 01/12/2017 1011   PROT 8.7 (H) 05/11/2023 1440   PROT 6.4 01/12/2017 1011   PROT 6.1 01/12/2017 1011   ALBUMIN 3.0 (L) 05/11/2023 1440   ALBUMIN 3.6 01/12/2017 1011   AST 22 05/11/2023 1440   AST 20 01/12/2017 1011   ALT 24 05/11/2023 1440   ALT 18 01/12/2017 1011   ALKPHOS 97 05/11/2023 1440   ALKPHOS 81 01/12/2017 1011   BILITOT 0.3 05/11/2023 1440   BILITOT 0.58 01/12/2017 1011   GFRNONAA 54 (L) 05/11/2023 1440   GFRAA >60 02/14/2020 1115    No  results found for: "SPEP", "UPEP"  Lab Results  Component Value Date   WBC 10.9 (H) 05/11/2023   NEUTROABS 8.8 (H) 05/11/2023   HGB 8.9 (L) 05/11/2023   HCT 27.3 (L) 05/11/2023   MCV 88.9 05/11/2023   PLT 215 05/11/2023      Chemistry      Component Value Date/Time   NA 138 05/11/2023 1440   NA 139 01/12/2017 1011   K 4.4 05/11/2023 1440   K 4.3 01/12/2017 1011   CL 104 05/11/2023 1440   CO2 26 05/11/2023 1440   CO2 23 01/12/2017 1011   BUN 48 (H) 05/11/2023 1440   BUN 21.8 01/12/2017 1011   CREATININE 1.35 (H) 05/11/2023 1440   CREATININE 0.9 01/12/2017 1011      Component Value Date/Time   CALCIUM 8.8 (L) 05/11/2023 1440   CALCIUM 9.0 01/12/2017 1011   ALKPHOS 97 05/11/2023 1440   ALKPHOS 81 01/12/2017 1011   AST 22 05/11/2023 1440   AST 20 01/12/2017 1011   ALT 24 05/11/2023 1440   ALT 18 01/12/2017 1011   BILITOT 0.3 05/11/2023 1440   BILITOT 0.58 01/12/2017 1011       RADIOGRAPHIC STUDIES: I have personally reviewed the radiological images as listed and agreed with the findings in the report. US RENAL  Result Date: 05/04/2023 CLINICAL DATA:  Acute kidney injury EXAM: RENAL / URINARY TRACT ULTRASOUND COMPLETE COMPARISON:  PET-CT 04/22/2023 FINDINGS: Right Kidney: Renal measurements: 14.1 x 6 x 6.7 cm = volume: 301 mL. Echogenicity within normal limits. No mass or hydronephrosis visualized. Left Kidney: Renal measurements: 14.1 x 6 x 6.6 cm = volume: 288 mL. Echogenicity within normal limits. No mass or hydronephrosis visualized. 4 mm echogenic focus demonstrated in the lower pole consistent with a stone. Bladder: Appears normal for degree of bladder distention. Other: None. IMPRESSION: 1. 4 mm nonobstructing stone in the lower pole left kidney. 2. No hydronephrosis or solid mass in either kidney. Electronically Signed   By: Burman Nieves M.D.   On: 05/04/2023 23:49   NM PET Image Initial (PI) Whole Body (F-18 FDG)  Result Date: 05/03/2023 CLINICAL DATA:   Subsequent treatment strategy for multiple myeloma. EXAM: NUCLEAR MEDICINE PET WHOLE BODY TECHNIQUE: 13 mCi F-18 FDG was injected intravenously. Full-ring PET imaging was performed from the head to foot after the radiotracer. CT data was obtained and used for attenuation correction and anatomic localization. Fasting blood glucose: 114 mg/dl COMPARISON:  None Available. FINDINGS: Mediastinal blood pool activity: SUV max 2.0 HEAD/NECK: No hypermetabolic activity in the scalp. No hypermetabolic cervical lymph nodes. Incidental CT findings:  Is CHEST: No hypermetabolic mediastinal or hilar nodes. No suspicious pulmonary nodules on the CT scan. Incidental CT findings: none ABDOMEN/PELVIS: No abnormal hypermetabolic activity within the liver, pancreas, adrenal glands, or spleen. No hypermetabolic lymph nodes in the abdomen or pelvis. Incidental CT findings: none SKELETON: There multiple foci focal radiotracer activity within the skeleton. Lesions are accompaniedlytic change on CT portion exam. There approximately 50 lesions. For example lesion the posterior RIGHT fifth rib with SUV max equal 3.6 on image 120. There is a lucent lesion and pathologic fracture associated with hypermetabolic activity the posterior LEFT sixth rib with SUV max equal 6.4 on image 129. Multiple lesions throughout the spine. Most intense lesion spinal lesion has associated soft tissue expansion at the the T2 vertebral body measuring 2.5 cm (107) with SUV max equal 7.3 Lesion in the inferior LEFT pubic rami subtle lucency on image 299. Suspicious lesions in LEFT and RIGHT femoral necks. Incidental CT findings: none EXTREMITIES: Hypermetabolic lesions RIGHT humeral shaft. Lesion the proximal LEFT and RIGHT femurs. Incidental CT findings: none IMPRESSION: 1. Multiple hypermetabolic lytic lesions throughout the skeleton consistent with multiple myeloma. Approximately 50 lesions within the axillary and appendicular skeleton. Many lesions accompanied  lytic/lucent lesions on CT. 2. No evidence of soft tissue plasmacytoma. Electronically Signed   By: Genevive Bi M.D.   On: 05/03/2023 15:16   CT Chest W Contrast  Result Date: 05/03/2023 CLINICAL DATA:  Pneumonia with complication suspected. X-ray done. Cough with rib pain after coughing. Shortness of breath and right-sided pain. History of multiple myeloma. EXAM: CT CHEST WITH CONTRAST TECHNIQUE: Multidetector CT imaging of the chest was performed during intravenous contrast administration. RADIATION DOSE REDUCTION: This exam was performed according to the departmental dose-optimization program which includes automated exposure control, adjustment of the mA and/or kV according to patient size and/or use of iterative reconstruction technique. CONTRAST:  60mL OMNIPAQUE IOHEXOL 300 MG/ML  SOLN COMPARISON:  Chest radiograph 05/02/2023.  PET-CT 04/22/2023 FINDINGS: Cardiovascular: Mild cardiac enlargement. No pericardial effusions. Normal caliber thoracic aorta. Calcification of the aorta and coronary arteries. Mediastinum/Nodes: Thyroid gland is unremarkable. Esophagus is decompressed. Scattered mediastinal lymph nodes are not pathologically enlarged. Lungs/Pleura: Motion artifact limits examination. There is evidence of atelectasis or consolidation in both lung bases. Small bilateral pleural effusions. Changes could represent pneumonia. No pneumothorax. Upper Abdomen: No acute abnormalities. Musculoskeletal: Degenerative changes in the spine and shoulders. Multiple lucent lesions demonstrated in the ribs, spine, and shoulders consistent with history of myeloma. Fractures of the right lateral fifth, sixth, and seventh ribs as well as the right tenth rib. Fractures of the left third, fourth, and sixth ribs. No vertebral compression deformities are demonstrated although large lucent lesions are demonstrated in the T1 and T2 vertebrae. IMPRESSION: 1. Multiple lucent lesions demonstrated in the ribs, spine, and  shoulders corresponding to the patient's history of multiple myeloma. 2. Multiple bilateral acute rib fractures. 3. Small bilateral pleural effusions with basilar atelectasis or consolidation, possibly pneumonia. Electronically Signed   By: Burman Nieves M.D.   On: 05/03/2023 01:21   DG Chest Port 1 View  Result Date: 05/02/2023 CLINICAL DATA:  Shortness of breath and cough. History of multiple myeloma. Right-sided pain. EXAM: PORTABLE CHEST 1 VIEW COMPARISON:  04/20/2023 FINDINGS: Shallow inspiration. Cardiac enlargement. No vascular congestion or edema. Linear atelectasis in the lung bases. No pleural effusions. No pneumothorax. Calcification of the aorta. A nondisplaced right lateral ninth rib fracture is suggested. IMPRESSION: Shallow inspiration with atelectasis in the lung bases. Cardiac enlargement. Probable  right ninth rib fracture. Electronically Signed   By: Burman Nieves M.D.   On: 05/02/2023 23:45   CT BONE MARROW BIOPSY & ASPIRATION  Result Date: 04/27/2023 INDICATION: myeloma staging EXAM: CT GUIDED BONE MARROW ASPIRATION AND CORE BIOPSY MEDICATIONS: None. ANESTHESIA/SEDATION: Moderate (conscious) sedation was employed during this procedure. A total of Versed 2 mg and Fentanyl 100 mcg was administered intravenously. Moderate Sedation Time: 13 minutes. The patient's level of consciousness and vital signs were monitored continuously by radiology nursing throughout the procedure under my direct supervision. FLUOROSCOPY TIME:  CT dose; 122 mGycm COMPLICATIONS: None immediate. Estimated blood loss: <5 mL PROCEDURE: RADIATION DOSE REDUCTION: This exam was performed according to the departmental dose-optimization program which includes automated exposure control, adjustment of the mA and/or kV according to patient size and/or use of iterative reconstruction technique. Informed written consent was obtained from the patient after a thorough discussion of the procedural risks, benefits and  alternatives. All questions were addressed. Maximal Sterile Barrier Technique was utilized including caps, mask, sterile gowns, sterile gloves, sterile drape, hand hygiene and skin antiseptic. A timeout was performed prior to the initiation of the procedure. The patient was positioned prone and non-contrast localization CT was performed of the pelvis to demonstrate the iliac marrow spaces. Maximal barrier sterile technique utilized including caps, mask, sterile gowns, sterile gloves, large sterile drape, hand hygiene, and chlorhexidine prep. Under sterile conditions and local anesthesia, an 11 gauge coaxial bone biopsy needle was advanced into the RIGHT iliac marrow space. Needle position was confirmed with CT imaging. Initially, bone marrow aspiration was performed. Next, the 11 gauge outer cannula was utilized to obtain a 1 iliac bone marrow core biopsy. Needle was removed. Hemostasis was obtained with compression. The patient tolerated the procedure well. Samples were prepared with the cytotechnologist. IMPRESSION: Successful CT-guided bone marrow aspiration and biopsy. Roanna Banning, MD [facility] Radiology Electronically Signed   By: Roanna Banning M.D.   On: 04/27/2023 12:32   DG Chest 2 View  Result Date: 04/23/2023 CLINICAL DATA:  Cough for 2 months Pain between shoulder blades EXAM: CHEST - 2 VIEW COMPARISON:  08/24/2019 FINDINGS: Cardiomediastinal silhouette and pulmonary vasculature are within normal limits. Lungs are hyperexpanded but otherwise clear. Unchanged dextroconvex curvature of the thoracolumbar spine. IMPRESSION: 1. No acute cardiopulmonary process. 2. Hyperexpanded lungs, suspicious for emphysema. Electronically Signed   By: Acquanetta Belling M.D.   On: 04/23/2023 16:44

## 2023-05-11 NOTE — Assessment & Plan Note (Signed)
He has amazing improvement of his tests since discharge from the hospital His calcium level has normalized His is still anemic but has improvement of his renal function He will complete chemo education class today and he will start first dose of chemotherapy on Friday He will continue dexamethasone daily and take pulsed dexamethasone on Fridays He will start lenalidomide on Friday He will take aspirin for DVT prophylaxis I will start him on acyclovir for antimicrobial prophylaxis He will continue vitamin D supplement He had received pamidronate in the hospital and I plan to give him Zometa next month after dental clearance I will see him next week for further follow-up

## 2023-05-11 NOTE — Assessment & Plan Note (Signed)
The cause of his anemia is multifactorial He is not symptomatic Observe closely for now

## 2023-05-12 ENCOUNTER — Encounter: Payer: Self-pay | Admitting: Hematology and Oncology

## 2023-05-12 ENCOUNTER — Telehealth: Payer: Self-pay

## 2023-05-12 DIAGNOSIS — N179 Acute kidney failure, unspecified: Secondary | ICD-10-CM | POA: Diagnosis not present

## 2023-05-12 DIAGNOSIS — D649 Anemia, unspecified: Secondary | ICD-10-CM | POA: Diagnosis not present

## 2023-05-12 DIAGNOSIS — G4733 Obstructive sleep apnea (adult) (pediatric): Secondary | ICD-10-CM | POA: Diagnosis not present

## 2023-05-12 DIAGNOSIS — I2510 Atherosclerotic heart disease of native coronary artery without angina pectoris: Secondary | ICD-10-CM | POA: Diagnosis not present

## 2023-05-12 DIAGNOSIS — I119 Hypertensive heart disease without heart failure: Secondary | ICD-10-CM | POA: Diagnosis not present

## 2023-05-12 DIAGNOSIS — E785 Hyperlipidemia, unspecified: Secondary | ICD-10-CM | POA: Diagnosis not present

## 2023-05-12 DIAGNOSIS — C9000 Multiple myeloma not having achieved remission: Secondary | ICD-10-CM | POA: Diagnosis not present

## 2023-05-12 DIAGNOSIS — R0902 Hypoxemia: Secondary | ICD-10-CM | POA: Diagnosis not present

## 2023-05-12 DIAGNOSIS — S2243XD Multiple fractures of ribs, bilateral, subsequent encounter for fracture with routine healing: Secondary | ICD-10-CM | POA: Diagnosis not present

## 2023-05-12 DIAGNOSIS — J9811 Atelectasis: Secondary | ICD-10-CM | POA: Diagnosis not present

## 2023-05-12 LAB — BETA 2 MICROGLOBULIN, SERUM: Beta-2 Microglobulin: 3.7 mg/L — ABNORMAL HIGH (ref 0.6–2.4)

## 2023-05-12 LAB — KAPPA/LAMBDA LIGHT CHAINS
Kappa free light chain: 763.8 mg/L — ABNORMAL HIGH (ref 3.3–19.4)
Kappa, lambda light chain ratio: 138.87 — ABNORMAL HIGH (ref 0.26–1.65)
Lambda free light chains: 5.5 mg/L — ABNORMAL LOW (ref 5.7–26.3)

## 2023-05-12 NOTE — Progress Notes (Signed)
Received call from patient's family member with patient present after receiving my card per my request.  Introduced myself as Dance movement psychotherapist for any financial questions or concerns. She mentioned the description of the [employer] grant that was mentioned at check-in to assist with gas cards; etc. Advised patients with insurance are currently not eligible to apply for this grant unless they are receiving government assistance such as SNAP benefits.  She verbalized understanding.  They have my card for any additional financial questions or concerns.

## 2023-05-12 NOTE — Telephone Encounter (Signed)
Pt called to ask for clarification on acyclovir. He states the Rx bottle states to take 1 tab every day but the paper that was printed says to take BID. Advised pt to take as prescribed which is 1 tab daily. He verbalized understanding.

## 2023-05-12 NOTE — Telephone Encounter (Signed)
Oral Chemotherapy Pharmacist Encounter  I spoke with patient for overview of: Revlimid for the treatment of multiple myeloma in conjunction with Velcade, daratumumab, and dexamethasone, planned duration until disease progression or unacceptable toxicity.   Counseled patient on administration, dosing, side effects, monitoring, drug-food interactions, safe handling, storage, and disposal.  Patient will take Revlimid 10mg  capsules, 1 capsule by mouth once daily, without regard to food, with a full glass of water. Revlimid will be given 14 days on, 7 days off, repeat every 21 days.  Revlimid start date: 05/14/2023  Adverse effects of Revlimid include but are not limited to: nausea, constipation, diarrhea, abdominal pain, rash, fatigue, drug fever, and decreased blood counts.    Reviewed with patient importance of keeping a medication schedule and plan for any missed doses. No barriers to medication adherence identified.  Medication reconciliation performed and medication/allergy list updated.  Patient has picked up acyclovir and dexamethasone prescriptions and will wait until cycle 1 day 1 prior to starting dexamethasone. Patient counseled on importance of daily aspirin 81mg  for VTE prophylaxis.  Insurance authorization for Revlimid has been obtained. Revlimid prescription is being dispensed from [facility] as it is a limited distribution medication.  All questions answered. Patient voiced understanding and appreciation.  Medication education handout placed in mail for patient. Patient knows to call the office with questions or concerns. Oral Chemotherapy Clinic phone number provided to patient.   Bethel Born, PharmD Hematology/Oncology Clinical Pharmacist [facility] Oral Chemotherapy Navigation [facility] [phone] 05/12/2023    10:43 AM

## 2023-05-13 ENCOUNTER — Other Ambulatory Visit: Payer: Self-pay | Admitting: Hematology and Oncology

## 2023-05-13 ENCOUNTER — Encounter: Payer: Self-pay | Admitting: Hematology and Oncology

## 2023-05-13 ENCOUNTER — Other Ambulatory Visit (HOSPITAL_COMMUNITY): Payer: Self-pay

## 2023-05-13 ENCOUNTER — Telehealth: Payer: Self-pay

## 2023-05-13 DIAGNOSIS — I2510 Atherosclerotic heart disease of native coronary artery without angina pectoris: Secondary | ICD-10-CM | POA: Diagnosis not present

## 2023-05-13 DIAGNOSIS — D649 Anemia, unspecified: Secondary | ICD-10-CM | POA: Diagnosis not present

## 2023-05-13 DIAGNOSIS — J9811 Atelectasis: Secondary | ICD-10-CM | POA: Diagnosis not present

## 2023-05-13 DIAGNOSIS — I119 Hypertensive heart disease without heart failure: Secondary | ICD-10-CM | POA: Diagnosis not present

## 2023-05-13 DIAGNOSIS — C9000 Multiple myeloma not having achieved remission: Secondary | ICD-10-CM | POA: Diagnosis not present

## 2023-05-13 DIAGNOSIS — N179 Acute kidney failure, unspecified: Secondary | ICD-10-CM | POA: Diagnosis not present

## 2023-05-13 DIAGNOSIS — R0902 Hypoxemia: Secondary | ICD-10-CM | POA: Diagnosis not present

## 2023-05-13 DIAGNOSIS — S2243XD Multiple fractures of ribs, bilateral, subsequent encounter for fracture with routine healing: Secondary | ICD-10-CM | POA: Diagnosis not present

## 2023-05-13 NOTE — Telephone Encounter (Signed)
Returned call to grandson regarding elevated B12. He is asking if Tramone should stop B12 supplement.  Told him per Dr. Bertis Ruddy to stop B12  and medication removed from med list. Daniel Whitaker verbalized understanding.

## 2023-05-14 ENCOUNTER — Other Ambulatory Visit: Payer: Self-pay

## 2023-05-14 ENCOUNTER — Other Ambulatory Visit (HOSPITAL_COMMUNITY): Payer: Self-pay

## 2023-05-14 ENCOUNTER — Inpatient Hospital Stay: Payer: Medicare Other

## 2023-05-14 ENCOUNTER — Ambulatory Visit: Payer: Medicare Other | Admitting: Hematology and Oncology

## 2023-05-14 ENCOUNTER — Other Ambulatory Visit: Payer: Medicare Other

## 2023-05-14 ENCOUNTER — Ambulatory Visit: Payer: Medicare Other

## 2023-05-14 ENCOUNTER — Encounter: Payer: Self-pay | Admitting: Hematology and Oncology

## 2023-05-14 VITALS — BP 139/60 | HR 63 | Temp 98.0°F | Resp 18

## 2023-05-14 DIAGNOSIS — Z79899 Other long term (current) drug therapy: Secondary | ICD-10-CM | POA: Diagnosis not present

## 2023-05-14 DIAGNOSIS — D472 Monoclonal gammopathy: Secondary | ICD-10-CM | POA: Diagnosis not present

## 2023-05-14 DIAGNOSIS — C9000 Multiple myeloma not having achieved remission: Secondary | ICD-10-CM | POA: Diagnosis not present

## 2023-05-14 DIAGNOSIS — Z87891 Personal history of nicotine dependence: Secondary | ICD-10-CM | POA: Diagnosis not present

## 2023-05-14 DIAGNOSIS — Z5112 Encounter for antineoplastic immunotherapy: Secondary | ICD-10-CM | POA: Diagnosis not present

## 2023-05-14 MED ORDER — OXYCODONE HCL 5 MG PO TABS
5.0000 mg | ORAL_TABLET | ORAL | 0 refills | Status: DC | PRN
  Filled 2023-05-14 (×2): qty 60, 10d supply, fill #0

## 2023-05-14 MED ORDER — DARATUMUMAB-HYALURONIDASE-FIHJ 1800-30000 MG-UT/15ML ~~LOC~~ SOLN
1800.0000 mg | Freq: Once | SUBCUTANEOUS | Status: AC
Start: 2023-05-14 — End: 2023-05-14
  Administered 2023-05-14: 1800 mg via SUBCUTANEOUS
  Filled 2023-05-14: qty 15

## 2023-05-14 MED ORDER — DIPHENHYDRAMINE HCL 25 MG PO CAPS
25.0000 mg | ORAL_CAPSULE | Freq: Once | ORAL | Status: AC
  Administered 2023-05-14: 25 mg via ORAL
  Filled 2023-05-14: qty 1

## 2023-05-14 MED ORDER — BORTEZOMIB CHEMO SQ INJECTION 3.5 MG (2.5MG/ML)
1.0400 mg/m2 | Freq: Once | INTRAMUSCULAR | Status: AC
  Administered 2023-05-14: 2.5 mg via SUBCUTANEOUS
  Filled 2023-05-14: qty 1

## 2023-05-14 MED ORDER — ACETAMINOPHEN 325 MG PO TABS
650.0000 mg | ORAL_TABLET | Freq: Once | ORAL | Status: DC
  Filled 2023-05-14: qty 2

## 2023-05-14 MED ORDER — ASPIRIN 81 MG PO TBEC: 81.0000 mg | DELAYED_RELEASE_TABLET | Freq: Every day | ORAL | 3 refills | Status: DC

## 2023-05-14 MED ORDER — MONTELUKAST SODIUM 10 MG PO TABS
10.0000 mg | ORAL_TABLET | Freq: Once | ORAL | Status: AC
  Administered 2023-05-14: 10 mg via ORAL
  Filled 2023-05-14: qty 1

## 2023-05-14 NOTE — Patient Instructions (Signed)
[facility] - A DEPT OF [facility]  Discharge Instructions: Thank you for choosing [facility] to provide your oncology and hematology care.   If you have a lab appointment with the Cancer Center, please go directly to the Cancer Center and check in at the registration area.   Wear comfortable clothing and clothing appropriate for easy access to any Portacath or PICC line.   We strive to give you quality time with your provider. You may need to reschedule your appointment if you arrive late (15 or more minutes).  Arriving late affects you and other patients whose appointments are after yours.  Also, if you miss three or more appointments without notifying the office, you may be dismissed from the clinic at the provider's discretion.      For prescription refill requests, have your pharmacy contact our office and allow 72 hours for refills to be completed.    Today you received the following chemotherapy and/or immunotherapy agents Velcade, Faspro      To help prevent nausea and vomiting after your treatment, we encourage you to take your nausea medication as directed.  BELOW ARE SYMPTOMS THAT SHOULD BE REPORTED IMMEDIATELY: *FEVER GREATER THAN 100.4 F (38 C) OR HIGHER *CHILLS OR SWEATING *NAUSEA AND VOMITING THAT IS NOT CONTROLLED WITH YOUR NAUSEA MEDICATION *UNUSUAL SHORTNESS OF BREATH *UNUSUAL BRUISING OR BLEEDING *URINARY PROBLEMS (pain or burning when urinating, or frequent urination) *BOWEL PROBLEMS (unusual diarrhea, constipation, pain near the anus) TENDERNESS IN MOUTH AND THROAT WITH OR WITHOUT PRESENCE OF ULCERS (sore throat, sores in mouth, or a toothache) UNUSUAL RASH, SWELLING OR PAIN  UNUSUAL VAGINAL DISCHARGE OR ITCHING   Items with * indicate a potential emergency and should be followed up as soon as possible or go to the Emergency Department if any problems should occur.  Please show the CHEMOTHERAPY ALERT CARD or  IMMUNOTHERAPY ALERT CARD at check-in to the Emergency Department and triage nurse.  Should you have questions after your visit or need to cancel or reschedule your appointment, please contact [facility] - A DEPT OF [facility]  Dept: [phone]  and follow the prompts.  Office hours are 8:00 a.m. to 4:30 p.m. Monday - Friday. Please note that voicemails left after 4:00 p.m. may not be returned until the following business day.  We are closed weekends and major holidays. You have access to a nurse at all times for urgent questions. Please call the main number to the clinic Dept: [phone] and follow the prompts.   For any non-urgent questions, you may also contact your provider using MyChart. We now offer e-Visits for anyone 48 and older to request care online for non-urgent symptoms. For details visit [email or web address].   Also download the MyChart app! Go to the app store, search "MyChart", open the app, select [place], and log in with your MyChart username and password.  Bortezomib Injection What is this medication? BORTEZOMIB (bor TEZ oh mib) treats lymphoma. It may also be used to treat multiple myeloma, a type of bone marrow cancer. It works by blocking a protein that causes cancer cells to grow and multiply. This helps to slow or stop the spread of cancer cells. This medicine may be used for other purposes; ask your health care provider or pharmacist if you have questions. COMMON BRAND NAME(S): Velcade What should I tell my care team before I take this medication? They need to  know if you have any of these conditions: Dehydration Diabetes Heart disease Liver disease Tingling of the fingers or toes or other nerve disorder An unusual or allergic reaction to bortezomib, other medications, foods, dyes, or preservatives If you or your partner are pregnant or trying to get pregnant Breastfeeding How should I use this medication? This medication  is injected into a vein or under the skin. It is given by your care team in a hospital or clinic setting. Talk to your care team about the use of this medication in children. Special care may be needed. Overdosage: If you think you have taken too much of this medicine contact a poison control center or emergency room at once. NOTE: This medicine is only for you. Do not share this medicine with others. What if I miss a dose? Keep appointments for follow-up doses. It is important not to miss your dose. Call your care team if you are unable to keep an appointment. What may interact with this medication? Ketoconazole Rifampin This list may not describe all possible interactions. Give your health care provider a list of all the medicines, herbs, non-prescription drugs, or dietary supplements you use. Also tell them if you smoke, drink alcohol, or use illegal drugs. Some items may interact with your medicine. What should I watch for while using this medication? Your condition will be monitored carefully while you are receiving this medication. You may need blood work while taking this medication. This medication may affect your coordination, reaction time, or judgment. Do not drive or operate machinery until you know how this medication affects you. Sit up or stand slowly to reduce the risk of dizzy or fainting spells. Drinking alcohol with this medication can increase the risk of these side effects. This medication may increase your risk of getting an infection. Call your care team for advice if you get a fever, chills, sore throat, or other symptoms of a cold or flu. Do not treat yourself. Try to avoid being around people who are sick. Check with your care team if you have severe diarrhea, nausea, and vomiting, or if you sweat a lot. The loss of too much body fluid may make it dangerous for you to take this medication. Talk to your care team if you may be pregnant. Serious birth defects can occur if you  take this medication during pregnancy and for 7 months after the last dose. You will need a negative pregnancy test before starting this medication. Contraception is recommended while taking this medication and for 7 months after the last dose. Your care team can help you find the option that works for you. If your partner can get pregnant, use a condom during sex while taking this medication and for 4 months after the last dose. Do not breastfeed while taking this medication and for 2 months after the last dose. This medication may cause infertility. Talk to your care team if you are concerned about your fertility. What side effects may I notice from receiving this medication? Side effects that you should report to your care team as soon as possible: Allergic reactions--skin rash, itching, hives, swelling of the face, lips, tongue, or throat Bleeding--bloody or black, tar-like stools, vomiting blood or brown material that looks like coffee grounds, red or dark brown urine, small red or purple spots on skin, unusual bruising or bleeding Bleeding in the brain--severe headache, stiff neck, confusion, dizziness, change in vision, numbness or weakness of the face, arm, or leg, trouble speaking, trouble walking,  vomiting Bowel blockage--stomach cramping, unable to have a bowel movement or pass gas, loss of appetite, vomiting Heart failure--shortness of breath, swelling of the ankles, feet, or hands, sudden weight gain, unusual weakness or fatigue Infection--fever, chills, cough, sore throat, wounds that don't heal, pain or trouble when passing urine, general feeling of discomfort or being unwell Liver injury--right upper belly pain, loss of appetite, nausea, light-colored stool, dark yellow or brown urine, yellowing skin or eyes, unusual weakness or fatigue Low blood pressure--dizziness, feeling faint or lightheaded, blurry vision Lung injury--shortness of breath or trouble breathing, cough, spitting up  blood, chest pain, fever Pain, tingling, or numbness in the hands or feet Severe or prolonged diarrhea Stomach pain, bloody diarrhea, pale skin, unusual weakness or fatigue, decrease in the amount of urine, which may be signs of hemolytic uremic syndrome Sudden and severe headache, confusion, change in vision, seizures, which may be signs of posterior reversible encephalopathy syndrome (PRES) TTP--purple spots on the skin or inside the mouth, pale skin, yellowing skin or eyes, unusual weakness or fatigue, fever, fast or irregular heartbeat, confusion, change in vision, trouble speaking, trouble walking Tumor lysis syndrome (TLS)--nausea, vomiting, diarrhea, decrease in the amount of urine, dark urine, unusual weakness or fatigue, confusion, muscle pain or cramps, fast or irregular heartbeat, joint pain Side effects that usually do not require medical attention (report to your care team if they continue or are bothersome): Constipation Diarrhea Fatigue Loss of appetite Nausea This list may not describe all possible side effects. Call your doctor for medical advice about side effects. You may report side effects to FDA at 1-800-FDA-1088. Where should I keep my medication? This medication is given in a hospital or clinic. It will not be stored at home. NOTE: This sheet is a summary. It may not cover all possible information. If you have questions about this medicine, talk to your doctor, pharmacist, or health care provider.  2024 Elsevier/Gold Standard (2021-10-21 00:00:00) Daratumumab; Hyaluronidase Injection What is this medication? DARATUMUMAB; HYALURONIDASE (dar a toom ue mab; hye al ur ON i dase) treats multiple myeloma, a type of bone marrow cancer. Daratumumab works by blocking a protein that causes cancer cells to grow and multiply. This helps to slow or stop the spread of cancer cells. Hyaluronidase works by increasing the absorption of other medications in the body to help them work  better. This medication may also be used treat amyloidosis, a condition that causes the buildup of a protein (amyloid) in your body. It works by reducing the buildup of this protein, which decreases symptoms. It is a combination medication that contains a monoclonal antibody. This medicine may be used for other purposes; ask your health care provider or pharmacist if you have questions. COMMON BRAND NAME(S): DARZALEX FASPRO What should I tell my care team before I take this medication? They need to know if you have any of these conditions: Heart disease Infection, such as chickenpox, cold sores, herpes, hepatitis B Lung or breathing disease An unusual or allergic reaction to daratumumab, hyaluronidase, other medications, foods, dyes, or preservatives Pregnant or trying to get pregnant Breast-feeding How should I use this medication? This medication is injected under the skin. It is given by your care team in a hospital or clinic setting. Talk to your care team about the use of this medication in children. Special care may be needed. Overdosage: If you think you have taken too much of this medicine contact a poison control center or emergency room at once. NOTE: This medicine  is only for you. Do not share this medicine with others. What if I miss a dose? Keep appointments for follow-up doses. It is important not to miss your dose. Call your care team if you are unable to keep an appointment. What may interact with this medication? Interactions have not been studied. This list may not describe all possible interactions. Give your health care provider a list of all the medicines, herbs, non-prescription drugs, or dietary supplements you use. Also tell them if you smoke, drink alcohol, or use illegal drugs. Some items may interact with your medicine. What should I watch for while using this medication? Your condition will be monitored carefully while you are receiving this medication. This  medication can cause serious allergic reactions. To reduce your risk, your care team may give you other medication to take before receiving this one. Be sure to follow the directions from your care team. This medication can affect the results of blood tests to match your blood type. These changes can last for up to 6 months after the final dose. Your care team will do blood tests to match your blood type before you start treatment. Tell all of your care team that you are being treated with this medication before receiving a blood transfusion. This medication can affect the results of some tests used to determine treatment response; extra tests may be needed to evaluate response. Talk to your care team if you wish to become pregnant or think you are pregnant. This medication can cause serious birth defects if taken during pregnancy and for 3 months after the last dose. A reliable form of contraception is recommended while taking this medication and for 3 months after the last dose. Talk to your care team about effective forms of contraception. Do not breast-feed while taking this medication. What side effects may I notice from receiving this medication? Side effects that you should report to your care team as soon as possible: Allergic reactions--skin rash, itching, hives, swelling of the face, lips, tongue, or throat Heart rhythm changes--fast or irregular heartbeat, dizziness, feeling faint or lightheaded, chest pain, trouble breathing Infection--fever, chills, cough, sore throat, wounds that don't heal, pain or trouble when passing urine, general feeling of discomfort or being unwell Infusion reactions--chest pain, shortness of breath or trouble breathing, feeling faint or lightheaded Sudden eye pain or change in vision such as blurry vision, seeing halos around lights, vision loss Unusual bruising or bleeding Side effects that usually do not require medical attention (report to your care team if they  continue or are bothersome): Constipation Diarrhea Fatigue Nausea Pain, tingling, or numbness in the hands or feet Swelling of the ankles, hands, or feet This list may not describe all possible side effects. Call your doctor for medical advice about side effects. You may report side effects to FDA at 1-800-FDA-1088. Where should I keep my medication? This medication is given in a hospital or clinic. It will not be stored at home. NOTE: This sheet is a summary. It may not cover all possible information. If you have questions about this medicine, talk to your doctor, pharmacist, or health care provider.  2024 Elsevier/Gold Standard (2021-09-23 00:00:00)

## 2023-05-14 NOTE — Progress Notes (Signed)
Patient took 1000mg  of Tylenol at 8am and his steroid at home at 7am today.

## 2023-05-17 ENCOUNTER — Telehealth: Payer: Self-pay

## 2023-05-17 DIAGNOSIS — S2243XD Multiple fractures of ribs, bilateral, subsequent encounter for fracture with routine healing: Secondary | ICD-10-CM | POA: Diagnosis not present

## 2023-05-17 DIAGNOSIS — I2510 Atherosclerotic heart disease of native coronary artery without angina pectoris: Secondary | ICD-10-CM | POA: Diagnosis not present

## 2023-05-17 DIAGNOSIS — J9811 Atelectasis: Secondary | ICD-10-CM | POA: Diagnosis not present

## 2023-05-17 DIAGNOSIS — N179 Acute kidney failure, unspecified: Secondary | ICD-10-CM | POA: Diagnosis not present

## 2023-05-17 DIAGNOSIS — I119 Hypertensive heart disease without heart failure: Secondary | ICD-10-CM | POA: Diagnosis not present

## 2023-05-17 DIAGNOSIS — R0902 Hypoxemia: Secondary | ICD-10-CM | POA: Diagnosis not present

## 2023-05-17 DIAGNOSIS — D649 Anemia, unspecified: Secondary | ICD-10-CM | POA: Diagnosis not present

## 2023-05-17 DIAGNOSIS — C9000 Multiple myeloma not having achieved remission: Secondary | ICD-10-CM | POA: Diagnosis not present

## 2023-05-17 NOTE — Telephone Encounter (Signed)
-----   Message from Nurse Kasandra Knudsen sent at 05/14/2023 12:26 PM EST ----- Regarding: First time Faspro/Velcade-Gorsuch Patient tolerated treatment well.

## 2023-05-17 NOTE — Telephone Encounter (Signed)
Daniel Whitaker states that he is doing fine. He is eating, drinking, and urinating well. He knows to call the office at [phone] if he has any questions or concerns.

## 2023-05-18 DIAGNOSIS — R0902 Hypoxemia: Secondary | ICD-10-CM | POA: Diagnosis not present

## 2023-05-18 DIAGNOSIS — I119 Hypertensive heart disease without heart failure: Secondary | ICD-10-CM | POA: Diagnosis not present

## 2023-05-18 DIAGNOSIS — D649 Anemia, unspecified: Secondary | ICD-10-CM | POA: Diagnosis not present

## 2023-05-18 DIAGNOSIS — N179 Acute kidney failure, unspecified: Secondary | ICD-10-CM | POA: Diagnosis not present

## 2023-05-18 DIAGNOSIS — C9000 Multiple myeloma not having achieved remission: Secondary | ICD-10-CM | POA: Diagnosis not present

## 2023-05-18 DIAGNOSIS — J9811 Atelectasis: Secondary | ICD-10-CM | POA: Diagnosis not present

## 2023-05-18 DIAGNOSIS — S2243XD Multiple fractures of ribs, bilateral, subsequent encounter for fracture with routine healing: Secondary | ICD-10-CM | POA: Diagnosis not present

## 2023-05-18 DIAGNOSIS — I2510 Atherosclerotic heart disease of native coronary artery without angina pectoris: Secondary | ICD-10-CM | POA: Diagnosis not present

## 2023-05-20 ENCOUNTER — Telehealth: Payer: Self-pay

## 2023-05-20 LAB — MULTIPLE MYELOMA PANEL, SERUM
Albumin SerPl Elph-Mcnc: 2.9 g/dL (ref 2.9–4.4)
Albumin/Glob SerPl: 0.6 — ABNORMAL LOW (ref 0.7–1.7)
Alpha 1: 0.4 g/dL (ref 0.0–0.4)
Alpha2 Glob SerPl Elph-Mcnc: 0.9 g/dL (ref 0.4–1.0)
B-Globulin SerPl Elph-Mcnc: 0.8 g/dL (ref 0.7–1.3)
Gamma Glob SerPl Elph-Mcnc: 3.5 g/dL — ABNORMAL HIGH (ref 0.4–1.8)
Globulin, Total: 5.6 g/dL — ABNORMAL HIGH (ref 2.2–3.9)
IgA: 3141 mg/dL — ABNORMAL HIGH (ref 61–437)
IgG (Immunoglobin G), Serum: 308 mg/dL — ABNORMAL LOW (ref 603–1613)
IgM (Immunoglobulin M), Srm: 27 mg/dL (ref 15–143)
M Protein SerPl Elph-Mcnc: 3 g/dL — ABNORMAL HIGH
Total Protein ELP: 8.5 g/dL (ref 6.0–8.5)

## 2023-05-20 NOTE — Telephone Encounter (Signed)
Returned his call. No bm since Monday. He is starting to feel constipated. He is taking miralax daily and milk of magnesia daily. He is drinking lots of fluids and eating with no problems. Instructed to take Miralax BID and start Senokot-S 2 tabs TID as needed. He verbalized understanding and is aware of appts tomorrow.  FYI

## 2023-05-21 ENCOUNTER — Inpatient Hospital Stay: Payer: Medicare Other | Admitting: Hematology and Oncology

## 2023-05-21 ENCOUNTER — Inpatient Hospital Stay: Payer: Medicare Other

## 2023-05-21 ENCOUNTER — Encounter: Payer: Self-pay | Admitting: Hematology and Oncology

## 2023-05-21 ENCOUNTER — Other Ambulatory Visit (HOSPITAL_COMMUNITY): Payer: Self-pay

## 2023-05-21 VITALS — BP 131/60 | HR 68 | Resp 17

## 2023-05-21 VITALS — BP 146/61 | HR 79 | Temp 98.5°F | Resp 18 | Ht 73.0 in | Wt 245.6 lb

## 2023-05-21 DIAGNOSIS — D638 Anemia in other chronic diseases classified elsewhere: Secondary | ICD-10-CM | POA: Diagnosis not present

## 2023-05-21 DIAGNOSIS — K5909 Other constipation: Secondary | ICD-10-CM | POA: Diagnosis not present

## 2023-05-21 DIAGNOSIS — C9000 Multiple myeloma not having achieved remission: Secondary | ICD-10-CM

## 2023-05-21 DIAGNOSIS — G629 Polyneuropathy, unspecified: Secondary | ICD-10-CM | POA: Diagnosis not present

## 2023-05-21 DIAGNOSIS — Z79899 Other long term (current) drug therapy: Secondary | ICD-10-CM | POA: Diagnosis not present

## 2023-05-21 DIAGNOSIS — Z87891 Personal history of nicotine dependence: Secondary | ICD-10-CM | POA: Diagnosis not present

## 2023-05-21 DIAGNOSIS — Z5112 Encounter for antineoplastic immunotherapy: Secondary | ICD-10-CM | POA: Diagnosis not present

## 2023-05-21 DIAGNOSIS — D472 Monoclonal gammopathy: Secondary | ICD-10-CM | POA: Diagnosis not present

## 2023-05-21 DIAGNOSIS — G893 Neoplasm related pain (acute) (chronic): Secondary | ICD-10-CM | POA: Diagnosis not present

## 2023-05-21 LAB — CMP (CANCER CENTER ONLY)
ALT: 18 U/L (ref 0–44)
AST: 11 U/L — ABNORMAL LOW (ref 15–41)
Albumin: 3.2 g/dL — ABNORMAL LOW (ref 3.5–5.0)
Alkaline Phosphatase: 101 U/L (ref 38–126)
Anion gap: 9 (ref 5–15)
BUN: 37 mg/dL — ABNORMAL HIGH (ref 8–23)
CO2: 26 mmol/L (ref 22–32)
Calcium: 10 mg/dL (ref 8.9–10.3)
Chloride: 100 mmol/L (ref 98–111)
Creatinine: 1.04 mg/dL (ref 0.61–1.24)
GFR, Estimated: >60 mL/min (ref >60)
Glucose, Bld: 144 mg/dL — ABNORMAL HIGH (ref 70–99)
Potassium: 4.5 mmol/L (ref 3.5–5.1)
Sodium: 135 mmol/L (ref 135–145)
Total Bilirubin: 0.3 mg/dL (ref <1.2)
Total Protein: 8.4 g/dL — ABNORMAL HIGH (ref 6.5–8.1)

## 2023-05-21 LAB — CBC WITH DIFFERENTIAL (CANCER CENTER ONLY)
Abs Immature Granulocytes: 0.02 10*3/uL (ref 0.00–0.07)
Basophils Absolute: 0 10*3/uL (ref 0.0–0.1)
Basophils Relative: 0 %
Eosinophils Absolute: 0.1 10*3/uL (ref 0.0–0.5)
Eosinophils Relative: 2 %
HCT: 26.2 % — ABNORMAL LOW (ref 39.0–52.0)
Hemoglobin: 8.5 g/dL — ABNORMAL LOW (ref 13.0–17.0)
Immature Granulocytes: 0 %
Lymphocytes Relative: 6 %
Lymphs Abs: 0.5 10*3/uL — ABNORMAL LOW (ref 0.7–4.0)
MCH: 28.5 pg (ref 26.0–34.0)
MCHC: 32.4 g/dL (ref 30.0–36.0)
MCV: 87.9 fL (ref 80.0–100.0)
Monocytes Absolute: 0.4 10*3/uL (ref 0.1–1.0)
Monocytes Relative: 4 %
Neutro Abs: 8.1 10*3/uL — ABNORMAL HIGH (ref 1.7–7.7)
Neutrophils Relative %: 88 %
Platelet Count: 203 10*3/uL (ref 150–400)
RBC: 2.98 MIL/uL — ABNORMAL LOW (ref 4.22–5.81)
RDW: 15.9 % — ABNORMAL HIGH (ref 11.5–15.5)
WBC Count: 9.1 10*3/uL (ref 4.0–10.5)
nRBC: 0 % (ref 0.0–0.2)

## 2023-05-21 MED ORDER — FUROSEMIDE 20 MG PO TABS: ORAL_TABLET | ORAL | Status: DC

## 2023-05-21 MED ORDER — DARATUMUMAB-HYALURONIDASE-FIHJ 1800-30000 MG-UT/15ML ~~LOC~~ SOLN
1800.0000 mg | Freq: Once | SUBCUTANEOUS | Status: AC
Start: 2023-05-21 — End: 2023-05-21
  Administered 2023-05-21: 1800 mg via SUBCUTANEOUS
  Filled 2023-05-21: qty 15

## 2023-05-21 MED ORDER — DEXAMETHASONE 4 MG PO TABS: ORAL_TABLET | ORAL | Status: DC

## 2023-05-21 MED ORDER — ACETAMINOPHEN 325 MG PO TABS
650.0000 mg | ORAL_TABLET | Freq: Once | ORAL | Status: AC
  Administered 2023-05-21: 650 mg via ORAL
  Filled 2023-05-21: qty 2

## 2023-05-21 MED ORDER — DIPHENHYDRAMINE HCL 25 MG PO CAPS
25.0000 mg | ORAL_CAPSULE | Freq: Once | ORAL | Status: AC
  Administered 2023-05-21: 25 mg via ORAL
  Filled 2023-05-21: qty 1

## 2023-05-21 MED ORDER — OXYCODONE HCL 10 MG PO TABS
10.0000 mg | ORAL_TABLET | ORAL | 0 refills | Status: DC | PRN
  Filled 2023-05-21: qty 90, 15d supply, fill #0

## 2023-05-21 MED ORDER — MONTELUKAST SODIUM 10 MG PO TABS
10.0000 mg | ORAL_TABLET | Freq: Once | ORAL | Status: AC
  Administered 2023-05-21: 10 mg via ORAL
  Filled 2023-05-21: qty 1

## 2023-05-21 MED ORDER — BORTEZOMIB CHEMO SQ INJECTION 3.5 MG (2.5MG/ML)
1.0400 mg/m2 | Freq: Once | INTRAMUSCULAR | Status: AC
Start: 2023-05-21 — End: 2023-05-21
  Administered 2023-05-21: 2.5 mg via SUBCUTANEOUS
  Filled 2023-05-21: qty 1

## 2023-05-21 NOTE — Assessment & Plan Note (Signed)
This is likely anemia of chronic disease and from recent renal failure and chemotherapy. The patient denies recent history of bleeding such as epistaxis, hematuria or hematochezia. He is asymptomatic from the anemia. We will observe for now.

## 2023-05-21 NOTE — Assessment & Plan Note (Signed)
We discussed importance of laxatives 

## 2023-05-21 NOTE — Assessment & Plan Note (Signed)
He denies worsening neuropathy with bortezomib Monitor closely He will continue pain medication and Lyrica

## 2023-05-21 NOTE — Patient Instructions (Signed)
 [facility] - A DEPT OF [facility]  Discharge Instructions: Thank you for choosing [facility] to provide your oncology and hematology care.   If you have a lab appointment with the Cancer Center, please go directly to the Cancer Center and check in at the registration area.   Wear comfortable clothing and clothing appropriate for easy access to any Portacath or PICC line.   We strive to give you quality time with your provider. You may need to reschedule your appointment if you arrive late (15 or more minutes).  Arriving late affects you and other patients whose appointments are after yours.  Also, if you miss three or more appointments without notifying the office, you may be dismissed from the clinic at the provider's discretion.      For prescription refill requests, have your pharmacy contact our office and allow 72 hours for refills to be completed.    Today you received the following chemotherapy and/or immunotherapy agents Velcade, Faspro      To help prevent nausea and vomiting after your treatment, we encourage you to take your nausea medication as directed.  BELOW ARE SYMPTOMS THAT SHOULD BE REPORTED IMMEDIATELY: *FEVER GREATER THAN 100.4 F (38 C) OR HIGHER *CHILLS OR SWEATING *NAUSEA AND VOMITING THAT IS NOT CONTROLLED WITH YOUR NAUSEA MEDICATION *UNUSUAL SHORTNESS OF BREATH *UNUSUAL BRUISING OR BLEEDING *URINARY PROBLEMS (pain or burning when urinating, or frequent urination) *BOWEL PROBLEMS (unusual diarrhea, constipation, pain near the anus) TENDERNESS IN MOUTH AND THROAT WITH OR WITHOUT PRESENCE OF ULCERS (sore throat, sores in mouth, or a toothache) UNUSUAL RASH, SWELLING OR PAIN  UNUSUAL VAGINAL DISCHARGE OR ITCHING   Items with * indicate a potential emergency and should be followed up as soon as possible or go to the Emergency Department if any problems should occur.  Please show the CHEMOTHERAPY ALERT CARD or  IMMUNOTHERAPY ALERT CARD at check-in to the Emergency Department and triage nurse.  Should you have questions after your visit or need to cancel or reschedule your appointment, please contact [facility] - A DEPT OF [facility]  Dept: [phone]  and follow the prompts.  Office hours are 8:00 a.m. to 4:30 p.m. Monday - Friday. Please note that voicemails left after 4:00 p.m. may not be returned until the following business day.  We are closed weekends and major holidays. You have access to a nurse at all times for urgent questions. Please call the main number to the clinic Dept: [phone] and follow the prompts.   For any non-urgent questions, you may also contact your provider using MyChart. We now offer e-Visits for anyone 48 and older to request care online for non-urgent symptoms. For details visit [email or web address].   Also download the MyChart app! Go to the app store, search "MyChart", open the app, select [place], and log in with your MyChart username and password.  Bortezomib Injection What is this medication? BORTEZOMIB (bor TEZ oh mib) treats lymphoma. It may also be used to treat multiple myeloma, a type of bone marrow cancer. It works by blocking a protein that causes cancer cells to grow and multiply. This helps to slow or stop the spread of cancer cells. This medicine may be used for other purposes; ask your health care provider or pharmacist if you have questions. COMMON BRAND NAME(S): Velcade What should I tell my care team before I take this medication? They need to  know if you have any of these conditions: Dehydration Diabetes Heart disease Liver disease Tingling of the fingers or toes or other nerve disorder An unusual or allergic reaction to bortezomib, other medications, foods, dyes, or preservatives If you or your partner are pregnant or trying to get pregnant Breastfeeding How should I use this medication? This medication  is injected into a vein or under the skin. It is given by your care team in a hospital or clinic setting. Talk to your care team about the use of this medication in children. Special care may be needed. Overdosage: If you think you have taken too much of this medicine contact a poison control center or emergency room at once. NOTE: This medicine is only for you. Do not share this medicine with others. What if I miss a dose? Keep appointments for follow-up doses. It is important not to miss your dose. Call your care team if you are unable to keep an appointment. What may interact with this medication? Ketoconazole Rifampin This list may not describe all possible interactions. Give your health care provider a list of all the medicines, herbs, non-prescription drugs, or dietary supplements you use. Also tell them if you smoke, drink alcohol, or use illegal drugs. Some items may interact with your medicine. What should I watch for while using this medication? Your condition will be monitored carefully while you are receiving this medication. You may need blood work while taking this medication. This medication may affect your coordination, reaction time, or judgment. Do not drive or operate machinery until you know how this medication affects you. Sit up or stand slowly to reduce the risk of dizzy or fainting spells. Drinking alcohol with this medication can increase the risk of these side effects. This medication may increase your risk of getting an infection. Call your care team for advice if you get a fever, chills, sore throat, or other symptoms of a cold or flu. Do not treat yourself. Try to avoid being around people who are sick. Check with your care team if you have severe diarrhea, nausea, and vomiting, or if you sweat a lot. The loss of too much body fluid may make it dangerous for you to take this medication. Talk to your care team if you may be pregnant. Serious birth defects can occur if you  take this medication during pregnancy and for 7 months after the last dose. You will need a negative pregnancy test before starting this medication. Contraception is recommended while taking this medication and for 7 months after the last dose. Your care team can help you find the option that works for you. If your partner can get pregnant, use a condom during sex while taking this medication and for 4 months after the last dose. Do not breastfeed while taking this medication and for 2 months after the last dose. This medication may cause infertility. Talk to your care team if you are concerned about your fertility. What side effects may I notice from receiving this medication? Side effects that you should report to your care team as soon as possible: Allergic reactions--skin rash, itching, hives, swelling of the face, lips, tongue, or throat Bleeding--bloody or black, tar-like stools, vomiting blood or brown material that looks like coffee grounds, red or dark brown urine, small red or purple spots on skin, unusual bruising or bleeding Bleeding in the brain--severe headache, stiff neck, confusion, dizziness, change in vision, numbness or weakness of the face, arm, or leg, trouble speaking, trouble walking,  vomiting Bowel blockage--stomach cramping, unable to have a bowel movement or pass gas, loss of appetite, vomiting Heart failure--shortness of breath, swelling of the ankles, feet, or hands, sudden weight gain, unusual weakness or fatigue Infection--fever, chills, cough, sore throat, wounds that don't heal, pain or trouble when passing urine, general feeling of discomfort or being unwell Liver injury--right upper belly pain, loss of appetite, nausea, light-colored stool, dark yellow or brown urine, yellowing skin or eyes, unusual weakness or fatigue Low blood pressure--dizziness, feeling faint or lightheaded, blurry vision Lung injury--shortness of breath or trouble breathing, cough, spitting up  blood, chest pain, fever Pain, tingling, or numbness in the hands or feet Severe or prolonged diarrhea Stomach pain, bloody diarrhea, pale skin, unusual weakness or fatigue, decrease in the amount of urine, which may be signs of hemolytic uremic syndrome Sudden and severe headache, confusion, change in vision, seizures, which may be signs of posterior reversible encephalopathy syndrome (PRES) TTP--purple spots on the skin or inside the mouth, pale skin, yellowing skin or eyes, unusual weakness or fatigue, fever, fast or irregular heartbeat, confusion, change in vision, trouble speaking, trouble walking Tumor lysis syndrome (TLS)--nausea, vomiting, diarrhea, decrease in the amount of urine, dark urine, unusual weakness or fatigue, confusion, muscle pain or cramps, fast or irregular heartbeat, joint pain Side effects that usually do not require medical attention (report to your care team if they continue or are bothersome): Constipation Diarrhea Fatigue Loss of appetite Nausea This list may not describe all possible side effects. Call your doctor for medical advice about side effects. You may report side effects to FDA at 1-800-FDA-1088. Where should I keep my medication? This medication is given in a hospital or clinic. It will not be stored at home. NOTE: This sheet is a summary. It may not cover all possible information. If you have questions about this medicine, talk to your doctor, pharmacist, or health care provider.  2024 Elsevier/Gold Standard (2021-10-21 00:00:00) Daratumumab; Hyaluronidase Injection What is this medication? DARATUMUMAB; HYALURONIDASE (dar a toom ue mab; hye al ur ON i dase) treats multiple myeloma, a type of bone marrow cancer. Daratumumab works by blocking a protein that causes cancer cells to grow and multiply. This helps to slow or stop the spread of cancer cells. Hyaluronidase works by increasing the absorption of other medications in the body to help them work  better. This medication may also be used treat amyloidosis, a condition that causes the buildup of a protein (amyloid) in your body. It works by reducing the buildup of this protein, which decreases symptoms. It is a combination medication that contains a monoclonal antibody. This medicine may be used for other purposes; ask your health care provider or pharmacist if you have questions. COMMON BRAND NAME(S): DARZALEX FASPRO What should I tell my care team before I take this medication? They need to know if you have any of these conditions: Heart disease Infection, such as chickenpox, cold sores, herpes, hepatitis B Lung or breathing disease An unusual or allergic reaction to daratumumab, hyaluronidase, other medications, foods, dyes, or preservatives Pregnant or trying to get pregnant Breast-feeding How should I use this medication? This medication is injected under the skin. It is given by your care team in a hospital or clinic setting. Talk to your care team about the use of this medication in children. Special care may be needed. Overdosage: If you think you have taken too much of this medicine contact a poison control center or emergency room at once. NOTE: This medicine  is only for you. Do not share this medicine with others. What if I miss a dose? Keep appointments for follow-up doses. It is important not to miss your dose. Call your care team if you are unable to keep an appointment. What may interact with this medication? Interactions have not been studied. This list may not describe all possible interactions. Give your health care provider a list of all the medicines, herbs, non-prescription drugs, or dietary supplements you use. Also tell them if you smoke, drink alcohol, or use illegal drugs. Some items may interact with your medicine. What should I watch for while using this medication? Your condition will be monitored carefully while you are receiving this medication. This  medication can cause serious allergic reactions. To reduce your risk, your care team may give you other medication to take before receiving this one. Be sure to follow the directions from your care team. This medication can affect the results of blood tests to match your blood type. These changes can last for up to 6 months after the final dose. Your care team will do blood tests to match your blood type before you start treatment. Tell all of your care team that you are being treated with this medication before receiving a blood transfusion. This medication can affect the results of some tests used to determine treatment response; extra tests may be needed to evaluate response. Talk to your care team if you wish to become pregnant or think you are pregnant. This medication can cause serious birth defects if taken during pregnancy and for 3 months after the last dose. A reliable form of contraception is recommended while taking this medication and for 3 months after the last dose. Talk to your care team about effective forms of contraception. Do not breast-feed while taking this medication. What side effects may I notice from receiving this medication? Side effects that you should report to your care team as soon as possible: Allergic reactions--skin rash, itching, hives, swelling of the face, lips, tongue, or throat Heart rhythm changes--fast or irregular heartbeat, dizziness, feeling faint or lightheaded, chest pain, trouble breathing Infection--fever, chills, cough, sore throat, wounds that don't heal, pain or trouble when passing urine, general feeling of discomfort or being unwell Infusion reactions--chest pain, shortness of breath or trouble breathing, feeling faint or lightheaded Sudden eye pain or change in vision such as blurry vision, seeing halos around lights, vision loss Unusual bruising or bleeding Side effects that usually do not require medical attention (report to your care team if they  continue or are bothersome): Constipation Diarrhea Fatigue Nausea Pain, tingling, or numbness in the hands or feet Swelling of the ankles, hands, or feet This list may not describe all possible side effects. Call your doctor for medical advice about side effects. You may report side effects to FDA at 1-800-FDA-1088. Where should I keep my medication? This medication is given in a hospital or clinic. It will not be stored at home. NOTE: This sheet is a summary. It may not cover all possible information. If you have questions about this medicine, talk to your doctor, pharmacist, or health care provider.  2024 Elsevier/Gold Standard (2021-09-23 00:00:00)

## 2023-05-21 NOTE — Assessment & Plan Note (Signed)
He has amazing improvement since his last visit with normalization of calcium level, renal function and stability of hemoglobin I recommend dexamethasone taper after today.  He will take 2 mg on Mondays and Wednesdays and 20 mg every Friday. He will continue lenalidomide as directed, to be taken for 14 days on 7 days off He will take aspirin for DVT prophylaxis He will continue taking acyclovir for antimicrobial prophylaxis He will continue vitamin D supplement He had received pamidronate in the hospital and I plan to give him Zometa next month after dental clearance

## 2023-05-21 NOTE — Progress Notes (Signed)
Patient observed for 1 hour post Darzalex Faspro.  VS WNL, DC'd home in stable condition.

## 2023-05-21 NOTE — Assessment & Plan Note (Signed)
He has multifactorial pain.  He has recent exacerbation of his pain on the left rib cage area could be related to strain is activity related to recent physical therapy As the patient has been taking pain medicine every 4 hours with minimal relief, I recommend increasing the dose of oxycodone to 10 mg as needed along with acetaminophen I will reassess pain control in 2 weeks

## 2023-05-21 NOTE — Progress Notes (Signed)
Patient took dexamethasone today prior to arrival for his appointments.

## 2023-05-21 NOTE — Progress Notes (Signed)
[facility] OFFICE PROGRESS NOTE  Patient Care Team: [name], MD as PCP - General (Family Medicine) [name], MD as PCP - Cardiology (Cardiology)  ASSESSMENT & PLAN:  Multiple myeloma without remission [facility]) He has amazing improvement since his last visit with normalization of calcium level, renal function and stability of hemoglobin I recommend dexamethasone taper after today.  He will take 2 mg on Mondays and Wednesdays and 20 mg every Friday. He will continue lenalidomide as directed, to be taken for 14 days on 7 days off He will take aspirin for DVT prophylaxis He will continue taking acyclovir for antimicrobial prophylaxis He will continue vitamin D supplement He had received pamidronate in the hospital and I plan to give him Zometa next month after dental clearance   Neuropathy He denies worsening neuropathy with bortezomib Monitor closely He will continue pain medication and Lyrica  Anemia, chronic disease This is likely anemia of chronic disease and from recent renal failure and chemotherapy. The patient denies recent history of bleeding such as epistaxis, hematuria or hematochezia. He is asymptomatic from the anemia. We will observe for now.   Other constipation We discussed importance of laxatives  Cancer associated pain He has multifactorial pain.  He has recent exacerbation of his pain on the left rib cage area could be related to strain is activity related to recent physical therapy As the patient has been taking pain medicine every 4 hours with minimal relief, I recommend increasing the dose of oxycodone to 10 mg as needed along with acetaminophen I will reassess pain control in 2 weeks  No orders of the defined types were placed in this encounter.   All questions were answered. The patient knows to call the clinic with any problems, questions or concerns. The total time spent in the appointment was 40 minutes encounter with patients  including review of chart and various tests results, discussions about plan of care and coordination of care plan   [name], MD 05/21/2023 1:37 PM  INTERVAL HISTORY: Please see below for problem oriented charting. he returns for chemotherapy follow-up with his family members Starting yesterday, he noticed worsening pain on the left rib cage area He has been taking oxycodone every 4 hours as needed for pain He has minimum pain relief today He is struggled with constipation recently but after taking additional laxative, he had bowel movement this morning No worsening neuropathy His leg swelling has resolved.  He has lost almost 15 pounds of fluid weight since the last time I saw him. We spent a lot of time reviewing test results and discussed medication taper  REVIEW OF SYSTEMS:   Constitutional: Denies fevers, chills  Eyes: Denies blurriness of vision Ears, nose, mouth, throat, and face: Denies mucositis or sore throat Respiratory: Denies cough, dyspnea or wheezes Cardiovascular: Denies palpitation, chest discomfort or lower extremity swelling Skin: Denies abnormal skin rashes Lymphatics: Denies new lymphadenopathy or easy bruising Neurological:Denies numbness, tingling or new weaknesses Behavioral/Psych: Mood is stable, no new changes  All other systems were reviewed with the patient and are negative.  I have reviewed the past medical history, past surgical history, social history and family history with the patient and they are unchanged from previous note.  ALLERGIES:  is allergic to atorvastatin, cymbalta [duloxetine hcl], and penicillins.  MEDICATIONS:  Current Outpatient Medications  Medication Sig Dispense Refill   acyclovir (ZOVIRAX) 400 MG tablet Take 1 tablet (400 mg total) by mouth daily. 30 tablet 6   albuterol (VENTOLIN  HFA) 108 (90 Base) MCG/ACT inhaler Inhale 1-2 puffs into the lungs every 6 (six) hours as needed for wheezing or shortness of breath.      allopurinol (ZYLOPRIM) 300 MG tablet Take 1 tablet (300 mg total) by mouth daily. 30 tablet 0   aspirin EC 81 MG tablet Take 1 tablet (81 mg total) by mouth daily. 100 tablet 3   brimonidine (ALPHAGAN) 0.2 % ophthalmic solution Place 1 drop into the left eye in the morning and at bedtime.     carvedilol (COREG) 3.125 MG tablet Take 1 tablet (3.125 mg total) by mouth 2 (two) times daily. 180 tablet 3   cetirizine (ZYRTEC) 10 MG tablet Take 10 mg by mouth every morning.     Cholecalciferol (VITAMIN D3) 50 MCG (2000 UT) TABS Take 2,000 Units by mouth daily.     cyclobenzaprine (FLEXERIL) 10 MG tablet Take 10 mg by mouth 3 (three) times daily as needed for muscle spasms.     dexamethasone (DECADRON) 4 MG tablet Take 5 pills once a week on Fridays. On 12/23, 12/25, 12/30 and 1/1, take 1/2 tab     docusate sodium (COLACE) 100 MG capsule Take 100-200 mg by mouth 2 (two) times daily as needed for mild constipation.     dorzolamide-timolol (COSOPT) 22.3-6.8 MG/ML ophthalmic solution Place 1 drop into both eyes 2 (two) times daily.     furosemide (LASIX) 20 MG tablet Take 1 tab on Mondays, Wednesdays and Fridays     ketoconazole (NIZORAL) 2 % cream Apply 1 application  topically daily as needed for irritation.     latanoprost (XALATAN) 0.005 % ophthalmic solution Place 1 drop into both eyes at bedtime.     lenalidomide (REVLIMID) 10 MG capsule Take 1 capsule (10 mg total) by mouth daily for 14 days. Take for 14 days on, 7 days off, repeat every 21 days x 6. [employer] Auth #   [id number],  Date Obtained 05/06/23 14 capsule 0   lidocaine (XYLOCAINE) 5 % ointment Apply 1 Application topically 2 (two) times daily as needed. (Patient taking differently: Apply 1 Application topically 2 (two) times daily as needed (for foot pain).) 35.44 g 1   magnesium oxide (MAG-OX) 400 (240 Mg) MG tablet Take 400 mg by mouth daily.     Multiple Vitamins-Minerals (CENTRUM SILVER 50+MEN) TABS Take 1 tablet by mouth daily with breakfast.      nitroGLYCERIN (NITROSTAT) 0.4 MG SL tablet Place 1 tablet (0.4 mg total) under the tongue every 5 (five) minutes as needed. (Patient taking differently: Place 0.4 mg under the tongue every 5 (five) minutes as needed for chest pain.) 25 tablet 2   Omega-3 Fatty Acids (FISH OIL) 1200 MG CAPS Take 3,600 mg by mouth daily. 3     ondansetron (ZOFRAN) 8 MG tablet Take 1 tablet (8 mg total) by mouth every 8 (eight) hours as needed for nausea or vomiting. 30 tablet 1   Oxycodone HCl 10 MG TABS Take 1 tablet (10 mg total) by mouth every 4 (four) hours as needed for severe pain (pain score 7-10). 90 tablet 0   pantoprazole (PROTONIX) 40 MG tablet Take 40 mg by mouth daily before breakfast.     polyethylene glycol (MIRALAX / GLYCOLAX) 17 g packet Take 17 g by mouth daily.     pregabalin (LYRICA) 100 MG capsule TAKE 1 CAPSULE BY MOUTH IN THE MORNING, AND AT NOON, AND 2 CAPSULES AT NIGHT (Patient taking differently: Take 100 mg by mouth in the morning  and at bedtime.) 120 capsule 5   prochlorperazine (COMPAZINE) 10 MG tablet Take 1 tablet (10 mg total) by mouth every 6 (six) hours as needed for nausea or vomiting. 30 tablet 1   rosuvastatin (CRESTOR) 40 MG tablet TAKE 1 TABLET BY MOUTH DAILY 90 tablet 0   senna-docusate (SENOKOT-S) 8.6-50 MG tablet Take 1 tablet by mouth 2 (two) times daily.     TYLENOL 500 MG tablet Take 500-1,000 mg by mouth every 6 (six) hours as needed for mild pain (pain score 1-3) or headache.     No current facility-administered medications for this visit.    SUMMARY OF ONCOLOGIC HISTORY: Oncology History Overview Note  Myeloma FISH was neg Normal cytogenetics   Multiple myeloma without remission (HCC)  07/11/2013 Initial Diagnosis   Multiple myeloma without remission (HCC)   04/22/2023 PET scan   NM PET Image Initial (PI) Whole Body (F-18 FDG)  Result Date: 05/03/2023 CLINICAL DATA:  Subsequent treatment strategy for multiple myeloma. EXAM: NUCLEAR MEDICINE PET WHOLE BODY  TECHNIQUE: 13 mCi F-18 FDG was injected intravenously. Full-ring PET imaging was performed from the head to foot after the radiotracer. CT data was obtained and used for attenuation correction and anatomic localization. Fasting blood glucose: 114 mg/dl COMPARISON:  None Available. FINDINGS: Mediastinal blood pool activity: SUV max 2.0 HEAD/NECK: No hypermetabolic activity in the scalp. No hypermetabolic cervical lymph nodes. Incidental CT findings: Is CHEST: No hypermetabolic mediastinal or hilar nodes. No suspicious pulmonary nodules on the CT scan. Incidental CT findings: none ABDOMEN/PELVIS: No abnormal hypermetabolic activity within the liver, pancreas, adrenal glands, or spleen. No hypermetabolic lymph nodes in the abdomen or pelvis. Incidental CT findings: none SKELETON: There multiple foci focal radiotracer activity within the skeleton. Lesions are accompaniedlytic change on CT portion exam. There approximately 50 lesions. For example lesion the posterior RIGHT fifth rib with SUV max equal 3.6 on image 120. There is a lucent lesion and pathologic fracture associated with hypermetabolic activity the posterior LEFT sixth rib with SUV max equal 6.4 on image 129. Multiple lesions throughout the spine. Most intense lesion spinal lesion has associated soft tissue expansion at the the T2 vertebral body measuring 2.5 cm (107) with SUV max equal 7.3 Lesion in the inferior LEFT pubic rami subtle lucency on image 299. Suspicious lesions in LEFT and RIGHT femoral necks. Incidental CT findings: none EXTREMITIES: Hypermetabolic lesions RIGHT humeral shaft. Lesion the proximal LEFT and RIGHT femurs. Incidental CT findings: none IMPRESSION: 1. Multiple hypermetabolic lytic lesions throughout the skeleton consistent with multiple myeloma. Approximately 50 lesions within the axillary and appendicular skeleton. Many lesions accompanied lytic/lucent lesions on CT. 2. No evidence of soft tissue plasmacytoma. Electronically Signed    By: Genevive Bi M.D.   On: 05/03/2023 15:16   CT Chest W Contrast  Result Date: 05/03/2023 CLINICAL DATA:  Pneumonia with complication suspected. X-ray done. Cough with rib pain after coughing. Shortness of breath and right-sided pain. History of multiple myeloma. EXAM: CT CHEST WITH CONTRAST TECHNIQUE: Multidetector CT imaging of the chest was performed during intravenous contrast administration. RADIATION DOSE REDUCTION: This exam was performed according to the departmental dose-optimization program which includes automated exposure control, adjustment of the mA and/or kV according to patient size and/or use of iterative reconstruction technique. CONTRAST:  60mL OMNIPAQUE IOHEXOL 300 MG/ML  SOLN COMPARISON:  Chest radiograph 05/02/2023.  PET-CT 04/22/2023 FINDINGS: Cardiovascular: Mild cardiac enlargement. No pericardial effusions. Normal caliber thoracic aorta. Calcification of the aorta and coronary arteries. Mediastinum/Nodes: Thyroid gland is unremarkable.  Esophagus is decompressed. Scattered mediastinal lymph nodes are not pathologically enlarged. Lungs/Pleura: Motion artifact limits examination. There is evidence of atelectasis or consolidation in both lung bases. Small bilateral pleural effusions. Changes could represent pneumonia. No pneumothorax. Upper Abdomen: No acute abnormalities. Musculoskeletal: Degenerative changes in the spine and shoulders. Multiple lucent lesions demonstrated in the ribs, spine, and shoulders consistent with history of myeloma. Fractures of the right lateral fifth, sixth, and seventh ribs as well as the right tenth rib. Fractures of the left third, fourth, and sixth ribs. No vertebral compression deformities are demonstrated although large lucent lesions are demonstrated in the T1 and T2 vertebrae. IMPRESSION: 1. Multiple lucent lesions demonstrated in the ribs, spine, and shoulders corresponding to the patient's history of multiple myeloma. 2. Multiple bilateral  acute rib fractures. 3. Small bilateral pleural effusions with basilar atelectasis or consolidation, possibly pneumonia. Electronically Signed   By: Burman Nieves M.D.   On: 05/03/2023 01:21   DG Chest Port 1 View  Result Date: 05/02/2023 CLINICAL DATA:  Shortness of breath and cough. History of multiple myeloma. Right-sided pain. EXAM: PORTABLE CHEST 1 VIEW COMPARISON:  04/20/2023 FINDINGS: Shallow inspiration. Cardiac enlargement. No vascular congestion or edema. Linear atelectasis in the lung bases. No pleural effusions. No pneumothorax. Calcification of the aorta. A nondisplaced right lateral ninth rib fracture is suggested. IMPRESSION: Shallow inspiration with atelectasis in the lung bases. Cardiac enlargement. Probable right ninth rib fracture. Electronically Signed   By: Burman Nieves M.D.   On: 05/02/2023 23:45   CT BONE MARROW BIOPSY & ASPIRATION  Result Date: 04/27/2023 INDICATION: myeloma staging EXAM: CT GUIDED BONE MARROW ASPIRATION AND CORE BIOPSY MEDICATIONS: None. ANESTHESIA/SEDATION: Moderate (conscious) sedation was employed during this procedure. A total of Versed 2 mg and Fentanyl 100 mcg was administered intravenously. Moderate Sedation Time: 13 minutes. The patient's level of consciousness and vital signs were monitored continuously by radiology nursing throughout the procedure under my direct supervision. FLUOROSCOPY TIME:  CT dose; 122 mGycm COMPLICATIONS: None immediate. Estimated blood loss: <5 mL PROCEDURE: RADIATION DOSE REDUCTION: This exam was performed according to the departmental dose-optimization program which includes automated exposure control, adjustment of the mA and/or kV according to patient size and/or use of iterative reconstruction technique. Informed written consent was obtained from the patient after a thorough discussion of the procedural risks, benefits and alternatives. All questions were addressed. Maximal Sterile Barrier Technique was utilized  including caps, mask, sterile gowns, sterile gloves, sterile drape, hand hygiene and skin antiseptic. A timeout was performed prior to the initiation of the procedure. The patient was positioned prone and non-contrast localization CT was performed of the pelvis to demonstrate the iliac marrow spaces. Maximal barrier sterile technique utilized including caps, mask, sterile gowns, sterile gloves, large sterile drape, hand hygiene, and chlorhexidine prep. Under sterile conditions and local anesthesia, an 11 gauge coaxial bone biopsy needle was advanced into the RIGHT iliac marrow space. Needle position was confirmed with CT imaging. Initially, bone marrow aspiration was performed. Next, the 11 gauge outer cannula was utilized to obtain a 1 iliac bone marrow core biopsy. Needle was removed. Hemostasis was obtained with compression. The patient tolerated the procedure well. Samples were prepared with the cytotechnologist. IMPRESSION: Successful CT-guided bone marrow aspiration and biopsy. Roanna Banning, MD [facility] Radiology Electronically Signed   By: Roanna Banning M.D.   On: 04/27/2023 12:32   DG Chest 2 View  Result Date: 04/23/2023 CLINICAL DATA:  Cough for 2 months Pain between  shoulder blades EXAM: CHEST - 2 VIEW COMPARISON:  08/24/2019 FINDINGS: Cardiomediastinal silhouette and pulmonary vasculature are within normal limits. Lungs are hyperexpanded but otherwise clear. Unchanged dextroconvex curvature of the thoracolumbar spine. IMPRESSION: 1. No acute cardiopulmonary process. 2. Hyperexpanded lungs, suspicious for emphysema. Electronically Signed   By: Acquanetta Belling M.D.   On: 04/23/2023 16:44      04/27/2023 Pathology Results   Surgical Pathology CASE: [id number] PATIENT: Stephanie Coup Bone Marrow Report  Clinical History: Myeloma  DIAGNOSIS:  BONE MARROW, ASPIRATE, CLOT, CORE: -Hypercellular bone marrow with plasma cell neoplasm -See  comment  PERIPHERAL BLOOD: -Normocytic-normochromic anemia -Leukocytosis with neutrophilic left shift  COMMENT:  The bone marrow is hypercellular for age with increased number of plasma cells averaging 15% of all cells in the aspirate.  The clot and biopsy sections show interstitial infiltrates, numerous variably sized clusters in addition to an area of diffuse sheet of atypical plasma cells associated with kappa light chain restriction consistent with plasma cell neoplasm.  Correlation with cytogenetic and FISH studies is recommended.  MICROSCOPIC DESCRIPTION:  PERIPHERAL BLOOD SMEAR: The red blood cells display mild anisopoikilocytosis with mild to moderate polychromasia.  The white blood cells are slightly increased in number, mostly with neutrophils some of which display toxic granulation.  An occasional myelocyte is seen on scan.  The platelets are normal in number.  BONE MARROW ASPIRATE: Bone marrow particles present Erythroid precursors: Orderly and progressive maturation Granulocytic precursors: Orderly and progressive maturation for the most part Megakaryocytes: Abundant with scattered small and/or hypolobated forms or large cells. Lymphocytes/plasma cells: The plasma cells are increased in number representing 15% to all cells associated with atypical cytologic features characterized by cytomegaly and/or small nucleoli.  Large lymphoid aggregates are not seen.  TOUCH PREPARATIONS: A mixture of cell types present but with increased number of plasma cells  CLOT AND BIOPSY: The sections show variable cellularity ranging from 40 to 90% focally.  There are numerous variably sized clusters of atypical plasma cells in addition to a diffuse large sheet particularly in the area of most cellularity.  The background shows trilineage hematopoiesis.  Immunohistochemical stains for CD138 in addition to in situ hybridization for kappa and lambda were performed with appropriate controls.  CD138  highlights the increased plasma cell component consisting of interstitial infiltrates, numerous variably sized clusters, and an area of diffuse sheet particularly in the core biopsy. The plasma cells display kappa light chain restriction.  IRON STAIN: Iron stains are performed on a bone marrow aspirate or touch imprint smear and section of clot. The controls stained appropriately.       Storage Iron: Decreased      Ring Sideroblasts: Absent  ADDITIONAL DATA/TESTING: The specimen was sent for cytogenetic analysis and FISH for multiple myeloma and a separate report will follow.    05/04/2023 Cancer Staging   Staging form: Plasma Cell Myeloma and Plasma Cell Disorders, AJCC 8th Edition - Clinical stage from 05/04/2023: RISS Stage II (Beta-2-microglobulin (mg/L): 5.6, Albumin (g/dL): 2.5, ISS: Stage III, High-risk cytogenetics: Absent, LDH: Normal) - Signed by [name], MD on 05/11/2023 Stage prefix: Initial diagnosis Beta 2 microglobulin range (mg/L): Greater than or equal to 5.5 Albumin range (g/dL): Less than 3.5 Cytogenetics: No abnormalities   05/14/2023 -  Chemotherapy   Patient is on Treatment Plan : MYELOMA NEWLY DIAGNOSED TRANSPLANT CANDIDATE DaraVRd (Daratumumab SQ) q21d x 6 Cycles (Induction/Consolidation)       PHYSICAL EXAMINATION: ECOG PERFORMANCE STATUS: 1 - Symptomatic but completely ambulatory  Vitals:  05/21/23 1218  BP: (!) 146/61  Pulse: 79  Resp: 18  Temp: 98.5 F (36.9 C)  SpO2: 93%   Filed Weights   05/21/23 1218  Weight: 245 lb 9.6 oz (111.4 kg)    GENERAL:alert, no distress and comfortable NEURO: alert & oriented x 3 with fluent speech, no focal motor/sensory deficits  LABORATORY DATA:  I have reviewed the data as listed    Component Value Date/Time   NA 135 05/21/2023 1158   NA 139 01/12/2017 1011   K 4.5 05/21/2023 1158   K 4.3 01/12/2017 1011   CL 100 05/21/2023 1158   CO2 26 05/21/2023 1158   CO2 23 01/12/2017 1011   GLUCOSE 144 (H)  05/21/2023 1158   GLUCOSE 98 01/12/2017 1011   BUN 37 (H) 05/21/2023 1158   BUN 21.8 01/12/2017 1011   CREATININE 1.04 05/21/2023 1158   CREATININE 0.9 01/12/2017 1011   CALCIUM 10.0 05/21/2023 1158   CALCIUM 9.0 01/12/2017 1011   PROT 8.4 (H) 05/21/2023 1158   PROT 6.4 01/12/2017 1011   PROT 6.1 01/12/2017 1011   ALBUMIN 3.2 (L) 05/21/2023 1158   ALBUMIN 3.6 01/12/2017 1011   AST 11 (L) 05/21/2023 1158   AST 20 01/12/2017 1011   ALT 18 05/21/2023 1158   ALT 18 01/12/2017 1011   ALKPHOS 101 05/21/2023 1158   ALKPHOS 81 01/12/2017 1011   BILITOT 0.3 05/21/2023 1158   BILITOT 0.58 01/12/2017 1011   GFRNONAA >60 05/21/2023 1158   GFRAA >60 02/14/2020 1115    No results found for: "SPEP", "UPEP"  Lab Results  Component Value Date   WBC 9.1 05/21/2023   NEUTROABS 8.1 (H) 05/21/2023   HGB 8.5 (L) 05/21/2023   HCT 26.2 (L) 05/21/2023   MCV 87.9 05/21/2023   PLT 203 05/21/2023      Chemistry      Component Value Date/Time   NA 135 05/21/2023 1158   NA 139 01/12/2017 1011   K 4.5 05/21/2023 1158   K 4.3 01/12/2017 1011   CL 100 05/21/2023 1158   CO2 26 05/21/2023 1158   CO2 23 01/12/2017 1011   BUN 37 (H) 05/21/2023 1158   BUN 21.8 01/12/2017 1011   CREATININE 1.04 05/21/2023 1158   CREATININE 0.9 01/12/2017 1011      Component Value Date/Time   CALCIUM 10.0 05/21/2023 1158   CALCIUM 9.0 01/12/2017 1011   ALKPHOS 101 05/21/2023 1158   ALKPHOS 81 01/12/2017 1011   AST 11 (L) 05/21/2023 1158   AST 20 01/12/2017 1011   ALT 18 05/21/2023 1158   ALT 18 01/12/2017 1011   BILITOT 0.3 05/21/2023 1158   BILITOT 0.58 01/12/2017 1011

## 2023-05-24 ENCOUNTER — Other Ambulatory Visit: Payer: Self-pay | Admitting: Internal Medicine

## 2023-05-24 ENCOUNTER — Other Ambulatory Visit: Payer: Self-pay | Admitting: Cardiology

## 2023-05-24 ENCOUNTER — Telehealth: Payer: Self-pay

## 2023-05-24 ENCOUNTER — Ambulatory Visit
Admission: RE | Admit: 2023-05-24 | Discharge: 2023-05-24 | Disposition: A | Discharge status: Home / Self Care | Payer: Medicare Other | Source: Ambulatory Visit | Attending: Internal Medicine | Admitting: Internal Medicine

## 2023-05-24 DIAGNOSIS — J90 Pleural effusion, not elsewhere classified: Secondary | ICD-10-CM | POA: Diagnosis not present

## 2023-05-24 DIAGNOSIS — R051 Acute cough: Secondary | ICD-10-CM | POA: Diagnosis not present

## 2023-05-24 DIAGNOSIS — R0602 Shortness of breath: Secondary | ICD-10-CM | POA: Diagnosis not present

## 2023-05-24 DIAGNOSIS — J9601 Acute respiratory failure with hypoxia: Secondary | ICD-10-CM | POA: Diagnosis not present

## 2023-05-24 DIAGNOSIS — S2242XS Multiple fractures of ribs, left side, sequela: Secondary | ICD-10-CM | POA: Diagnosis not present

## 2023-05-24 NOTE — Telephone Encounter (Signed)
Returned his and granddaughters call. Daniel Whitaker yesterday started having a cough and productive green mucus from lungs. He denies fever. He sounds congested on the phone and feels like he may have up respiratory issues also. When he coughs he has severe pain in his left rib cage area. History of rib fractures and currently taking prescribed pain medication. His wife was recently sick with a bacterial infection and currently taking prescription medication.  Instructed to go to urgent care now to be evaluated. He and granddaughter verbalized understanding.  FYI

## 2023-05-25 ENCOUNTER — Telehealth: Payer: Self-pay | Admitting: *Deleted

## 2023-05-25 ENCOUNTER — Encounter: Payer: Self-pay | Admitting: Hematology and Oncology

## 2023-05-25 ENCOUNTER — Telehealth: Payer: Self-pay

## 2023-05-25 NOTE — Telephone Encounter (Signed)
Spoke with pt's family member:, Ival Bible who stated that pt went to his medical MD & had CXR & was given an ATB, Z-Pack.  He is tapering his steroid per Dr Bertis Ruddy & was concerned if anything else needed to be done.  Should they continue taper?  Does he need something besides z-Pack?  While we were on the phone PCP messaged that he did have an infection per CXR results.  He states that pt is lethargic but not confused.  O2 sats 93-95 on Oxygen.  Hgb was 8.5 at last OV.  Encouraged to observe & continue ATB & steroid taper for now & to go to ED if symptoms are worse.  Message routed to Dr Candise Che & Dr Bertis Ruddy.

## 2023-05-25 NOTE — Telephone Encounter (Signed)
Ival Bible called in regarding pt. Daniel Whitaker had some questions about the tapered down medication and the Zpac that he just started. Contact number was giving to [facility] to triage patients call.

## 2023-05-25 NOTE — Telephone Encounter (Signed)
Received vm call from son , Ival Bible reporting that pt saw internal Dr. Burgess Estelle & CXR was done & diagnosed with pneumonia & started z-pack.  He is on tapered dose of steroid.  He is wondering if z-pack is enough.  Reports that pt is lethargic but not confused.  Called [name] & he just got report from internal med office that CXR is concerning for infection.  O2 sats 93-95 on oxygen.  Hgb was 8.5 at last visit.  Encouraged to go to ED if worsening of symptoms:  SOB, worsening cough, swelling of hands/feet, changes in alertness, etc.  Informed that message would be sent to Dr Bertis Ruddy but not sure she would get today.  Will also forward to Dr Candise Che who is on-call to see if he recommends anything else.

## 2023-05-25 NOTE — Telephone Encounter (Signed)
Called & left message for grandson, Toni Arthurs to have pt evaluated in ED per Dr Candise Che since pt is immunocompromised & z-pack may not be proper drug for pneumonia.

## 2023-05-27 ENCOUNTER — Telehealth: Payer: Self-pay | Admitting: *Deleted

## 2023-05-27 NOTE — Telephone Encounter (Signed)
This RN returned call to the pt's daughter per her call wanting to verify if pt should proceed with treatment tomorrow since he has been diagnosed with pneumonia and is on antibiotics.  His current status is mildly improved with continued congestion that " is now breaking up more but he is so tired ".  Per covering provider - best to hold treatment tomorrow and follow up next week.  This RN informed daughter of above. Reviewed labs from last week with noted heme of 8.5.  Daughter understands to call if further concerns.  Appointments canceled for tomorrow.

## 2023-05-28 ENCOUNTER — Inpatient Hospital Stay: Payer: Medicare Other

## 2023-05-28 ENCOUNTER — Inpatient Hospital Stay (HOSPITAL_BASED_OUTPATIENT_CLINIC_OR_DEPARTMENT_OTHER): Payer: Medicare Other | Admitting: Physician Assistant

## 2023-05-28 ENCOUNTER — Other Ambulatory Visit: Payer: Self-pay | Admitting: *Deleted

## 2023-05-28 VITALS — BP 140/52 | HR 79 | Temp 98.4°F | Resp 20 | Ht 73.0 in | Wt 240.7 lb

## 2023-05-28 DIAGNOSIS — K5909 Other constipation: Secondary | ICD-10-CM

## 2023-05-28 DIAGNOSIS — J189 Pneumonia, unspecified organism: Secondary | ICD-10-CM | POA: Diagnosis not present

## 2023-05-28 DIAGNOSIS — C9000 Multiple myeloma not having achieved remission: Secondary | ICD-10-CM

## 2023-05-28 DIAGNOSIS — D539 Nutritional anemia, unspecified: Secondary | ICD-10-CM

## 2023-05-28 DIAGNOSIS — Z87891 Personal history of nicotine dependence: Secondary | ICD-10-CM | POA: Diagnosis not present

## 2023-05-28 DIAGNOSIS — D472 Monoclonal gammopathy: Secondary | ICD-10-CM | POA: Diagnosis not present

## 2023-05-28 DIAGNOSIS — Z79899 Other long term (current) drug therapy: Secondary | ICD-10-CM | POA: Diagnosis not present

## 2023-05-28 DIAGNOSIS — D638 Anemia in other chronic diseases classified elsewhere: Secondary | ICD-10-CM

## 2023-05-28 DIAGNOSIS — Z5112 Encounter for antineoplastic immunotherapy: Secondary | ICD-10-CM | POA: Diagnosis not present

## 2023-05-28 LAB — CMP (CANCER CENTER ONLY)
ALT: 32 U/L (ref 0–44)
AST: 15 U/L (ref 15–41)
Albumin: 3.1 g/dL — ABNORMAL LOW (ref 3.5–5.0)
Alkaline Phosphatase: 90 U/L (ref 38–126)
Anion gap: 9 (ref 5–15)
BUN: 36 mg/dL — ABNORMAL HIGH (ref 8–23)
CO2: 29 mmol/L (ref 22–32)
Calcium: 10.5 mg/dL — ABNORMAL HIGH (ref 8.9–10.3)
Chloride: 98 mmol/L (ref 98–111)
Creatinine: 1.14 mg/dL (ref 0.61–1.24)
GFR, Estimated: >60 mL/min (ref >60)
Glucose, Bld: 99 mg/dL (ref 70–99)
Potassium: 3.8 mmol/L (ref 3.5–5.1)
Sodium: 136 mmol/L (ref 135–145)
Total Bilirubin: 0.4 mg/dL (ref <1.2)
Total Protein: 7.7 g/dL (ref 6.5–8.1)

## 2023-05-28 LAB — CBC WITH DIFFERENTIAL (CANCER CENTER ONLY)
Abs Immature Granulocytes: 0.02 10*3/uL (ref 0.00–0.07)
Basophils Absolute: 0 10*3/uL (ref 0.0–0.1)
Basophils Relative: 1 %
Eosinophils Absolute: 0.3 10*3/uL (ref 0.0–0.5)
Eosinophils Relative: 8 %
HCT: 25.3 % — ABNORMAL LOW (ref 39.0–52.0)
Hemoglobin: 8.3 g/dL — ABNORMAL LOW (ref 13.0–17.0)
Immature Granulocytes: 1 %
Lymphocytes Relative: 19 %
Lymphs Abs: 0.7 10*3/uL (ref 0.7–4.0)
MCH: 28.5 pg (ref 26.0–34.0)
MCHC: 32.8 g/dL (ref 30.0–36.0)
MCV: 86.9 fL (ref 80.0–100.0)
Monocytes Absolute: 0.4 10*3/uL (ref 0.1–1.0)
Monocytes Relative: 11 %
Neutro Abs: 2.3 10*3/uL (ref 1.7–7.7)
Neutrophils Relative %: 60 %
Platelet Count: 167 10*3/uL (ref 150–400)
RBC: 2.91 MIL/uL — ABNORMAL LOW (ref 4.22–5.81)
RDW: 15.8 % — ABNORMAL HIGH (ref 11.5–15.5)
WBC Count: 3.8 10*3/uL — ABNORMAL LOW (ref 4.0–10.5)
nRBC: 0 % (ref 0.0–0.2)

## 2023-05-28 LAB — SAMPLE TO BLOOD BANK

## 2023-05-28 LAB — MAGNESIUM: Magnesium: 2 mg/dL (ref 1.7–2.4)

## 2023-05-28 NOTE — Progress Notes (Signed)
Symptom Management Consult Note [facility]    Patient Care Team: [name], MD as PCP - General (Family Medicine) [name], MD as PCP - Cardiology (Cardiology)    Name / MRN / DOB: Mantas Donia  [id number]  September 26, 1945   Date of visit: 05/28/2023   Chief Complaint/Reason for visit: cough   Current Therapy: Velcade, darzalex faspro  Last treatment:  Day 8   Cycle 1 on 05/21/23   ASSESSMENT & PLAN: Patient is a 77 y.o. adult with oncologic history of multiple myeloma not in remission followed by Dr. Bertis Ruddy.  I have viewed most recent oncology note and lab work.    #Multiple myeloma not in remission - Treatment held this week while patient is being treated for pneumonia - Next appointment with oncologist is 06/11/23   #Pneumonia -Chart reviewed from PCP office visit on 05/24/23 when he was diagnosed with pneumonia. -Currently taking Levaquin. -Exam with rales, no respiratory distress, no hypoxia on 2L.CBC showing leukopenia 3.8 and hemoglobin stable at 8.3. Neutropenic precautions discussed. Patient has been afebrile since starting Levaquin. -Patient given incentive spirometer.  -Engaged in shared decision making with patient and grandson. With reassuring vitals, labs, and exam they are comfortable with continuing outpatient management.  -Recommended lidocaine patch for rib pain in addition to the oxycodone.   #Constipation - No BM in x 2 days. Abdominal exam is benign.  -Discussed OTC management in detail.     Strict ED precautions discussed should symptoms worsen.   Heme/Onc History: Oncology History Overview Note  Myeloma FISH was neg Normal cytogenetics   Multiple myeloma without remission (HCC)  07/11/2013 Initial Diagnosis   Multiple myeloma without remission (HCC)   04/22/2023 PET scan   NM PET Image Initial (PI) Whole Body (F-18 FDG)  Result Date: 05/03/2023 CLINICAL DATA:  Subsequent treatment strategy for multiple  myeloma. EXAM: NUCLEAR MEDICINE PET WHOLE BODY TECHNIQUE: 13 mCi F-18 FDG was injected intravenously. Full-ring PET imaging was performed from the head to foot after the radiotracer. CT data was obtained and used for attenuation correction and anatomic localization. Fasting blood glucose: 114 mg/dl COMPARISON:  None Available. FINDINGS: Mediastinal blood pool activity: SUV max 2.0 HEAD/NECK: No hypermetabolic activity in the scalp. No hypermetabolic cervical lymph nodes. Incidental CT findings: Is CHEST: No hypermetabolic mediastinal or hilar nodes. No suspicious pulmonary nodules on the CT scan. Incidental CT findings: none ABDOMEN/PELVIS: No abnormal hypermetabolic activity within the liver, pancreas, adrenal glands, or spleen. No hypermetabolic lymph nodes in the abdomen or pelvis. Incidental CT findings: none SKELETON: There multiple foci focal radiotracer activity within the skeleton. Lesions are accompaniedlytic change on CT portion exam. There approximately 50 lesions. For example lesion the posterior RIGHT fifth rib with SUV max equal 3.6 on image 120. There is a lucent lesion and pathologic fracture associated with hypermetabolic activity the posterior LEFT sixth rib with SUV max equal 6.4 on image 129. Multiple lesions throughout the spine. Most intense lesion spinal lesion has associated soft tissue expansion at the the T2 vertebral body measuring 2.5 cm (107) with SUV max equal 7.3 Lesion in the inferior LEFT pubic rami subtle lucency on image 299. Suspicious lesions in LEFT and RIGHT femoral necks. Incidental CT findings: none EXTREMITIES: Hypermetabolic lesions RIGHT humeral shaft. Lesion the proximal LEFT and RIGHT femurs. Incidental CT findings: none IMPRESSION: 1. Multiple hypermetabolic lytic lesions throughout the skeleton consistent with multiple myeloma. Approximately 50 lesions within the axillary and appendicular skeleton. Many lesions accompanied lytic/lucent  lesions on CT. 2. No evidence of  soft tissue plasmacytoma. Electronically Signed   By: Genevive Bi M.D.   On: 05/03/2023 15:16   CT Chest W Contrast  Result Date: 05/03/2023 CLINICAL DATA:  Pneumonia with complication suspected. X-ray done. Cough with rib pain after coughing. Shortness of breath and right-sided pain. History of multiple myeloma. EXAM: CT CHEST WITH CONTRAST TECHNIQUE: Multidetector CT imaging of the chest was performed during intravenous contrast administration. RADIATION DOSE REDUCTION: This exam was performed according to the departmental dose-optimization program which includes automated exposure control, adjustment of the mA and/or kV according to patient size and/or use of iterative reconstruction technique. CONTRAST:  60mL OMNIPAQUE IOHEXOL 300 MG/ML  SOLN COMPARISON:  Chest radiograph 05/02/2023.  PET-CT 04/22/2023 FINDINGS: Cardiovascular: Mild cardiac enlargement. No pericardial effusions. Normal caliber thoracic aorta. Calcification of the aorta and coronary arteries. Mediastinum/Nodes: Thyroid gland is unremarkable. Esophagus is decompressed. Scattered mediastinal lymph nodes are not pathologically enlarged. Lungs/Pleura: Motion artifact limits examination. There is evidence of atelectasis or consolidation in both lung bases. Small bilateral pleural effusions. Changes could represent pneumonia. No pneumothorax. Upper Abdomen: No acute abnormalities. Musculoskeletal: Degenerative changes in the spine and shoulders. Multiple lucent lesions demonstrated in the ribs, spine, and shoulders consistent with history of myeloma. Fractures of the right lateral fifth, sixth, and seventh ribs as well as the right tenth rib. Fractures of the left third, fourth, and sixth ribs. No vertebral compression deformities are demonstrated although large lucent lesions are demonstrated in the T1 and T2 vertebrae. IMPRESSION: 1. Multiple lucent lesions demonstrated in the ribs, spine, and shoulders corresponding to the patient's history  of multiple myeloma. 2. Multiple bilateral acute rib fractures. 3. Small bilateral pleural effusions with basilar atelectasis or consolidation, possibly pneumonia. Electronically Signed   By: Burman Nieves M.D.   On: 05/03/2023 01:21   DG Chest Port 1 View  Result Date: 05/02/2023 CLINICAL DATA:  Shortness of breath and cough. History of multiple myeloma. Right-sided pain. EXAM: PORTABLE CHEST 1 VIEW COMPARISON:  04/20/2023 FINDINGS: Shallow inspiration. Cardiac enlargement. No vascular congestion or edema. Linear atelectasis in the lung bases. No pleural effusions. No pneumothorax. Calcification of the aorta. A nondisplaced right lateral ninth rib fracture is suggested. IMPRESSION: Shallow inspiration with atelectasis in the lung bases. Cardiac enlargement. Probable right ninth rib fracture. Electronically Signed   By: Burman Nieves M.D.   On: 05/02/2023 23:45   CT BONE MARROW BIOPSY & ASPIRATION  Result Date: 04/27/2023 INDICATION: myeloma staging EXAM: CT GUIDED BONE MARROW ASPIRATION AND CORE BIOPSY MEDICATIONS: None. ANESTHESIA/SEDATION: Moderate (conscious) sedation was employed during this procedure. A total of Versed 2 mg and Fentanyl 100 mcg was administered intravenously. Moderate Sedation Time: 13 minutes. The patient's level of consciousness and vital signs were monitored continuously by radiology nursing throughout the procedure under my direct supervision. FLUOROSCOPY TIME:  CT dose; 122 mGycm COMPLICATIONS: None immediate. Estimated blood loss: <5 mL PROCEDURE: RADIATION DOSE REDUCTION: This exam was performed according to the departmental dose-optimization program which includes automated exposure control, adjustment of the mA and/or kV according to patient size and/or use of iterative reconstruction technique. Informed written consent was obtained from the patient after a thorough discussion of the procedural risks, benefits and alternatives. All questions were addressed. Maximal  Sterile Barrier Technique was utilized including caps, mask, sterile gowns, sterile gloves, sterile drape, hand hygiene and skin antiseptic. A timeout was performed prior to the initiation of the procedure. The patient was positioned prone and non-contrast localization CT  was performed of the pelvis to demonstrate the iliac marrow spaces. Maximal barrier sterile technique utilized including caps, mask, sterile gowns, sterile gloves, large sterile drape, hand hygiene, and chlorhexidine prep. Under sterile conditions and local anesthesia, an 11 gauge coaxial bone biopsy needle was advanced into the RIGHT iliac marrow space. Needle position was confirmed with CT imaging. Initially, bone marrow aspiration was performed. Next, the 11 gauge outer cannula was utilized to obtain a 1 iliac bone marrow core biopsy. Needle was removed. Hemostasis was obtained with compression. The patient tolerated the procedure well. Samples were prepared with the cytotechnologist. IMPRESSION: Successful CT-guided bone marrow aspiration and biopsy. Roanna Banning, MD [facility] Electronically Signed   By: Roanna Banning M.D.   On: 04/27/2023 12:32   DG Chest 2 View  Result Date: 04/23/2023 CLINICAL DATA:  Cough for 2 months Pain between shoulder blades EXAM: CHEST - 2 VIEW COMPARISON:  08/24/2019 FINDINGS: Cardiomediastinal silhouette and pulmonary vasculature are within normal limits. Lungs are hyperexpanded but otherwise clear. Unchanged dextroconvex curvature of the thoracolumbar spine. IMPRESSION: 1. No acute cardiopulmonary process. 2. Hyperexpanded lungs, suspicious for emphysema. Electronically Signed   By: Acquanetta Belling M.D.   On: 04/23/2023 16:44      04/27/2023 Pathology Results   Surgical Pathology CASE: [id number] PATIENT: Stephanie Coup Bone Marrow Report  Clinical History: Myeloma  DIAGNOSIS:  BONE MARROW, ASPIRATE, CLOT, CORE: -Hypercellular bone marrow  with plasma cell neoplasm -See comment  PERIPHERAL BLOOD: -Normocytic-normochromic anemia -Leukocytosis with neutrophilic left shift  COMMENT:  The bone marrow is hypercellular for age with increased number of plasma cells averaging 15% of all cells in the aspirate.  The clot and biopsy sections show interstitial infiltrates, numerous variably sized clusters in addition to an area of diffuse sheet of atypical plasma cells associated with kappa light chain restriction consistent with plasma cell neoplasm.  Correlation with cytogenetic and FISH studies is recommended.  MICROSCOPIC DESCRIPTION:  PERIPHERAL BLOOD SMEAR: The red blood cells display mild anisopoikilocytosis with mild to moderate polychromasia.  The white blood cells are slightly increased in number, mostly with neutrophils some of which display toxic granulation.  An occasional myelocyte is seen on scan.  The platelets are normal in number.  BONE MARROW ASPIRATE: Bone marrow particles present Erythroid precursors: Orderly and progressive maturation Granulocytic precursors: Orderly and progressive maturation for the most part Megakaryocytes: Abundant with scattered small and/or hypolobated forms or large cells. Lymphocytes/plasma cells: The plasma cells are increased in number representing 15% to all cells associated with atypical cytologic features characterized by cytomegaly and/or small nucleoli.  Large lymphoid aggregates are not seen.  TOUCH PREPARATIONS: A mixture of cell types present but with increased number of plasma cells  CLOT AND BIOPSY: The sections show variable cellularity ranging from 40 to 90% focally.  There are numerous variably sized clusters of atypical plasma cells in addition to a diffuse large sheet particularly in the area of most cellularity.  The background shows trilineage hematopoiesis.  Immunohistochemical stains for CD138 in addition to in situ hybridization for kappa and lambda were performed with  appropriate controls.  CD138 highlights the increased plasma cell component consisting of interstitial infiltrates, numerous variably sized clusters, and an area of diffuse sheet particularly in the core biopsy. The plasma cells display kappa light chain restriction.  IRON STAIN: Iron stains are performed on a bone marrow aspirate or touch imprint smear and section of clot. The controls stained appropriately.  Storage Iron: Decreased      Ring Sideroblasts: Absent  ADDITIONAL DATA/TESTING: The specimen was sent for cytogenetic analysis and FISH for multiple myeloma and a separate report will follow.    05/04/2023 Cancer Staging   Staging form: Plasma Cell Myeloma and Plasma Cell Disorders, AJCC 8th Edition - Clinical stage from 05/04/2023: RISS Stage II (Beta-2-microglobulin (mg/L): 5.6, Albumin (g/dL): 2.5, ISS: Stage III, High-risk cytogenetics: Absent, LDH: Normal) - Signed by [name], MD on 05/11/2023 Stage prefix: Initial diagnosis Beta 2 microglobulin range (mg/L): Greater than or equal to 5.5 Albumin range (g/dL): Less than 3.5 Cytogenetics: No abnormalities   05/14/2023 -  Chemotherapy   Patient is on Treatment Plan : MYELOMA NEWLY DIAGNOSED TRANSPLANT CANDIDATE DaraVRd (Daratumumab SQ) q21d x 6 Cycles (Induction/Consolidation)         Interval history-: Discussed the use of AI scribe software for clinical note transcription with the patient, who gave verbal consent to proceed.   CAFFREY HERDMAN is a 77 y.o. adult with oncologic history as above presenting to [facility] today with chief complaint of cough. Patient is accompanied by family member who provides additional history.  Patient reports his cough has been bothersome since being diagnosed with pneumonia earlier this week. The patient reported difficulty in expectorating phlegm from the chest and experienced sharp pain in the back, particularly during coughing episodes.  The patient also reported broken ribs from a  previous coughing incident earlier this month and that pain has been exacerbated from his frequent coughing. The pain can be severe and has caused him to sleep in a recliner chair. Patient is wearing home oxygen and is 96% on 2L.  The patient also reported fatigue. The patient's fatigue was attributed to the combined effects of chemotherapy and pneumonia. He has been increasing his fluid intake, trying his best to stay well hydrated.  His appetite has been decreased secondary to not feeling well.  The patient was first prescribed a Z-Pak for suspected pneumonia, which was later switched to Levaquin due to concerns about being immunocompromised. He has been taking Levaquin x 2 days now. The patient feels pain is mostly controlled on the 10 mg oxycodone he is currently taking.  The patient also reported constipation, which is being managed with Miralax and Milk of Magnesia.   ROS  All other systems are reviewed and are negative for acute change except as noted in the HPI.    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   Nocturnal leg cramps 03/11/2021   Nontoxic uninodular goiter    Obesity    Peripheral neuropathy    RLS (restless legs syndrome)    Spinal stenosis    Unspecified deficiency anemia 07/11/2013     Past Surgical History:  Procedure Laterality Date   CARDIAC CATHETERIZATION     COLON RESECTION N/A 04/21/2013   Procedure: DIAGNOSTIC LAPAROSCOPY, lysis of adhesions for partial small  bowel obstruction;  Surgeon: Adolph Pollack, MD;  Location: [facility] ORS;  Service: General;  Laterality: N/A;   COLON SURGERY     partial SBO    COLONOSCOPY     CORONARY STENT INTERVENTION N/A 10/22/2021   Procedure: CORONARY STENT INTERVENTION;  Surgeon: Marykay Lex, MD;  Location: [facility] INVASIVE CV LAB;  Service: Cardiovascular;  Laterality: N/A;   KNEE SURGERY     Arthroscopic   LEFT HEART CATH AND CORONARY ANGIOGRAPHY N/A 10/22/2021   Procedure: LEFT HEART CATH AND CORONARY ANGIOGRAPHY;  Surgeon: Marykay Lex, MD;  Location: [facility] INVASIVE CV LAB;  Service: Cardiovascular;  Laterality: N/A;   SMALL INTESTINE SURGERY     Blockage    Social History   Socioeconomic History   Marital status: Married    Spouse name: Bonita Quin   Number of children: 2   Years of education: GED   Highest education level: Not on file  Occupational History   Occupation: still working  Tobacco Use   Smoking status: Former    Current packs/day: 0.00    Types: Cigarettes    Quit date: 06/01/1984    Years since quitting: 39.0   Smokeless tobacco: Never  Vaping Use   Vaping status: Never Used  Substance and Sexual Activity   Alcohol use: Not Currently    Alcohol/week: 10.0 standard drinks of alcohol    Types: 5 Glasses of wine, 5 Cans of beer per week   Drug use: No   Sexual activity: Not on file  Other Topics Concern   Not on file  Social History Narrative   Patient lives at home with his wife Bonita Quin)   Retired.    Patient has two children.   Patient is right-handed.   Patient has his GED.   Patient does not drink caffeine.   Social Drivers of Corporate investment banker Strain: Not on file  Food Insecurity: No Food Insecurity (05/03/2023)   Hunger Vital Sign    Worried About Running Out of Food in the Last Year: Never true    Ran Out of Food in the Last Year: Never true  Transportation Needs: No Transportation Needs (05/03/2023)   PRAPARE - Administrator, Civil Service (Medical):  No    Lack of Transportation (Non-Medical): No  Physical Activity: Not on file  Stress: Not on file  Social Connections: Unknown (10/10/2021)   Received from [facility]   Social Network    Social Network: Not on file  Intimate Partner Violence: Not At Risk (05/03/2023)   Humiliation, Afraid, Rape, and Kick questionnaire    Fear of Current or Ex-Partner: No    Emotionally Abused: No    Physically Abused: No    Sexually Abused: No    Family History  Problem Relation Age of Onset   Brain cancer Mother    Lung cancer Father    Heart disease Brother        No details   Dementia Brother    Neuropathy Neg Hx    Colon cancer Neg Hx    Colon polyps Neg Hx    Esophageal cancer Neg Hx    Rectal cancer Neg Hx    Stomach cancer Neg Hx    Sleep apnea Neg Hx      Current Outpatient Medications:    acyclovir (ZOVIRAX) 400 MG tablet, Take 1 tablet (400 mg total) by mouth daily., Disp: 30 tablet, Rfl: 6   albuterol (VENTOLIN HFA) 108 (90 Base) MCG/ACT inhaler,  Inhale 1-2 puffs into the lungs every 6 (six) hours as needed for wheezing or shortness of breath., Disp: , Rfl:    allopurinol (ZYLOPRIM) 300 MG tablet, Take 1 tablet (300 mg total) by mouth daily., Disp: 30 tablet, Rfl: 0   aspirin EC 81 MG tablet, Take 1 tablet (81 mg total) by mouth daily., Disp: 100 tablet, Rfl: 3   brimonidine (ALPHAGAN) 0.2 % ophthalmic solution, Place 1 drop into the left eye in the morning and at bedtime., Disp: , Rfl:    carvedilol (COREG) 3.125 MG tablet, TAKE 1 TABLET BY MOUTH TWICE DAILY, Disp: 180 tablet, Rfl: 3   cetirizine (ZYRTEC) 10 MG tablet, Take 10 mg by mouth every morning., Disp: , Rfl:    Cholecalciferol (VITAMIN D3) 50 MCG (2000 UT) TABS, Take 2,000 Units by mouth daily., Disp: , Rfl:    cyclobenzaprine (FLEXERIL) 10 MG tablet, Take 10 mg by mouth 3 (three) times daily as needed for muscle spasms., Disp: , Rfl:    dexamethasone (DECADRON) 4 MG tablet, Take 5 pills once a week on  Fridays. On 12/23, 12/25, 12/30 and 1/1, take 1/2 tab, Disp: , Rfl:    docusate sodium (COLACE) 100 MG capsule, Take 100-200 mg by mouth 2 (two) times daily as needed for mild constipation., Disp: , Rfl:    dorzolamide-timolol (COSOPT) 22.3-6.8 MG/ML ophthalmic solution, Place 1 drop into both eyes 2 (two) times daily., Disp: , Rfl:    furosemide (LASIX) 20 MG tablet, Take 1 tab on Mondays, Wednesdays and Fridays, Disp: , Rfl:    ketoconazole (NIZORAL) 2 % cream, Apply 1 application  topically daily as needed for irritation., Disp: , Rfl:    latanoprost (XALATAN) 0.005 % ophthalmic solution, Place 1 drop into both eyes at bedtime., Disp: , Rfl:    lidocaine (XYLOCAINE) 5 % ointment, Apply 1 Application topically 2 (two) times daily as needed. (Patient taking differently: Apply 1 Application topically 2 (two) times daily as needed (for foot pain).), Disp: 35.44 g, Rfl: 1   magnesium oxide (MAG-OX) 400 (240 Mg) MG tablet, Take 400 mg by mouth daily., Disp: , Rfl:    Multiple Vitamins-Minerals (CENTRUM SILVER 50+MEN) TABS, Take 1 tablet by mouth daily with breakfast., Disp: , Rfl:    nitroGLYCERIN (NITROSTAT) 0.4 MG SL tablet, Place 1 tablet (0.4 mg total) under the tongue every 5 (five) minutes as needed. (Patient taking differently: Place 0.4 mg under the tongue every 5 (five) minutes as needed for chest pain.), Disp: 25 tablet, Rfl: 2   Omega-3 Fatty Acids (FISH OIL) 1200 MG CAPS, Take 3,600 mg by mouth daily. 3, Disp: , Rfl:    ondansetron (ZOFRAN) 8 MG tablet, Take 1 tablet (8 mg total) by mouth every 8 (eight) hours as needed for nausea or vomiting., Disp: 30 tablet, Rfl: 1   Oxycodone HCl 10 MG TABS, Take 1 tablet (10 mg total) by mouth every 4 (four) hours as needed for severe pain (pain score 7-10)., Disp: 90 tablet, Rfl: 0   pantoprazole (PROTONIX) 40 MG tablet, Take 40 mg by mouth daily before breakfast., Disp: , Rfl:    polyethylene glycol (MIRALAX / GLYCOLAX) 17 g packet, Take 17 g by mouth  daily., Disp: , Rfl:    pregabalin (LYRICA) 100 MG capsule, TAKE 1 CAPSULE BY MOUTH IN THE MORNING, AND AT NOON, AND 2 CAPSULES AT NIGHT (Patient taking differently: Take 100 mg by mouth in the morning and at bedtime.), Disp: 120 capsule, Rfl: 5   prochlorperazine (  COMPAZINE) 10 MG tablet, Take 1 tablet (10 mg total) by mouth every 6 (six) hours as needed for nausea or vomiting., Disp: 30 tablet, Rfl: 1   rosuvastatin (CRESTOR) 40 MG tablet, TAKE 1 TABLET BY MOUTH DAILY, Disp: 90 tablet, Rfl: 0   senna-docusate (SENOKOT-S) 8.6-50 MG tablet, Take 1 tablet by mouth 2 (two) times daily., Disp: , Rfl:    TYLENOL 500 MG tablet, Take 500-1,000 mg by mouth every 6 (six) hours as needed for mild pain (pain score 1-3) or headache., Disp: , Rfl:   PHYSICAL EXAM: ECOG FS:2 - Symptomatic, <50% confined to bed    Vitals:   05/28/23 1304  BP: (!) 140/52  Pulse: 79  Resp: 20  Temp: 98.4 F (36.9 C)  TempSrc: Temporal  SpO2: 96%  Weight: 240 lb 11.2 oz (109.2 kg)  Height: 6\' 1"  (1.854 m)   Physical Exam Vitals and nursing note reviewed.  Constitutional:      General: He is not in acute distress.    Appearance: He is ill-appearing (chronic). He is not toxic-appearing.  HENT:     Head: Normocephalic.  Eyes:     Conjunctiva/sclera: Conjunctivae normal.  Cardiovascular:     Rate and Rhythm: Normal rate and regular rhythm.     Pulses: Normal pulses.     Heart sounds: Normal heart sounds.  Pulmonary:     Effort: Pulmonary effort is normal.     Breath sounds: Normal breath sounds. No wheezing.     Comments: Rales in bilateral upper lobes Abdominal:     General: There is no distension.     Tenderness: There is no abdominal tenderness.  Musculoskeletal:     Cervical back: Normal range of motion.  Skin:    General: Skin is warm and dry.  Neurological:     Mental Status: He is alert.        LABORATORY DATA: I have reviewed the data as listed    Latest Ref Rng & Units 05/28/2023   12:29  PM 05/21/2023   11:58 AM 05/11/2023    2:40 PM  CBC  WBC 4.0 - 10.5 K/uL 3.8  9.1  10.9   Hemoglobin 13.0 - 17.0 g/dL 8.3  8.5  8.9   Hematocrit 39.0 - 52.0 % 25.3  26.2  27.3   Platelets 150 - 400 K/uL 167  203  215         Latest Ref Rng & Units 05/28/2023   12:29 PM 05/21/2023   11:58 AM 05/11/2023    2:40 PM  CMP  Glucose 70 - 99 mg/dL 99  308  657   BUN 8 - 23 mg/dL 36  37  48   Creatinine 0.61 - 1.24 mg/dL 8.46  9.62  9.52   Sodium 135 - 145 mmol/L 136  135  138   Potassium 3.5 - 5.1 mmol/L 3.8  4.5  4.4   Chloride 98 - 111 mmol/L 98  100  104   CO2 22 - 32 mmol/L 29  26  26    Calcium 8.9 - 10.3 mg/dL 84.1  32.4  8.8   Total Protein 6.5 - 8.1 g/dL 7.7  8.4  8.7   Total Bilirubin <1.2 mg/dL 0.4  0.3  0.3   Alkaline Phos 38 - 126 U/L 90  101  89   AST 15 - 41 U/L 15  11  22    ALT 0 - 44 U/L 32  18  24  RADIOGRAPHIC STUDIES (from last 24 hours if applicable) I have personally reviewed the radiological images as listed and agreed with the findings in the report. No results found.      Visit Diagnosis: 1. Multiple myeloma without remission (HCC)   2. Community acquired pneumonia of left upper lobe of lung   3. Other constipation      No orders of the defined types were placed in this encounter.   All questions were answered. The patient knows to call the clinic with any problems, questions or concerns. No barriers to learning was detected.  A total of more than 30 minutes were spent on this encounter with face-to-face time and non-face-to-face time, including preparing to see the patient, ordering tests and/or medications, counseling the patient and coordination of care as outlined above.    Thank you for allowing me to participate in the care of this patient.    Shanon Ace, PA-C Department of Hematology/Oncology [facility] at [facility] Phone: [phone]  [phone]    05/28/2023 3:14 PM

## 2023-05-28 NOTE — Progress Notes (Signed)
Pt grandson called with concerns of fatigue and drop in hgb. He stated that pt is on ABT for pneumonia but is more fatigued than before and has not been able to hydrate well. Advised that pt can be brought to [facility] to be evaluated by PA and receive IVF. Advised if transfusion is needed pt may be added for next day transfusion if there are appts available. [facility] notified and appt made. Pt grandson notified of times for appts today and verbalized understanding.

## 2023-05-31 ENCOUNTER — Other Ambulatory Visit: Payer: Self-pay | Admitting: *Deleted

## 2023-05-31 ENCOUNTER — Other Ambulatory Visit: Payer: Self-pay | Admitting: Hematology and Oncology

## 2023-05-31 DIAGNOSIS — C9000 Multiple myeloma not having achieved remission: Secondary | ICD-10-CM

## 2023-05-31 MED ORDER — LENALIDOMIDE 10 MG PO CAPS: 10.0000 mg | ORAL_CAPSULE | Freq: Every day | ORAL | 0 refills | Status: DC

## 2023-06-01 ENCOUNTER — Telehealth: Payer: Self-pay | Admitting: Neurology

## 2023-06-01 NOTE — Telephone Encounter (Signed)
 Lab result from [facility] on April 20, 2023  A1c 5.9, CMP showed normal creatinine 0.94, mild elevation of calcium 10.4, normal TSH 0.87, vitamin D 49.4, lipid panel LDL 18

## 2023-06-03 ENCOUNTER — Inpatient Hospital Stay: Payer: Medicare Other | Attending: Hematology and Oncology | Admitting: Hematology and Oncology

## 2023-06-03 ENCOUNTER — Telehealth: Payer: Self-pay

## 2023-06-03 ENCOUNTER — Encounter: Payer: Self-pay | Admitting: Hematology and Oncology

## 2023-06-03 DIAGNOSIS — G893 Neoplasm related pain (acute) (chronic): Secondary | ICD-10-CM | POA: Insufficient documentation

## 2023-06-03 DIAGNOSIS — C9000 Multiple myeloma not having achieved remission: Secondary | ICD-10-CM | POA: Diagnosis not present

## 2023-06-03 DIAGNOSIS — R0982 Postnasal drip: Secondary | ICD-10-CM | POA: Insufficient documentation

## 2023-06-03 DIAGNOSIS — Z79899 Other long term (current) drug therapy: Secondary | ICD-10-CM | POA: Insufficient documentation

## 2023-06-03 DIAGNOSIS — D539 Nutritional anemia, unspecified: Secondary | ICD-10-CM | POA: Diagnosis not present

## 2023-06-03 DIAGNOSIS — D801 Nonfamilial hypogammaglobulinemia: Secondary | ICD-10-CM | POA: Insufficient documentation

## 2023-06-03 DIAGNOSIS — Z5112 Encounter for antineoplastic immunotherapy: Secondary | ICD-10-CM | POA: Insufficient documentation

## 2023-06-03 DIAGNOSIS — R0601 Orthopnea: Secondary | ICD-10-CM | POA: Insufficient documentation

## 2023-06-03 DIAGNOSIS — Z7961 Long term (current) use of immunomodulator: Secondary | ICD-10-CM | POA: Insufficient documentation

## 2023-06-03 DIAGNOSIS — D638 Anemia in other chronic diseases classified elsewhere: Secondary | ICD-10-CM

## 2023-06-03 DIAGNOSIS — M79604 Pain in right leg: Secondary | ICD-10-CM | POA: Insufficient documentation

## 2023-06-03 MED ORDER — TRIAMCINOLONE ACETONIDE 55 MCG/ACT NA AERO: 2.0000 | INHALATION_SPRAY | Freq: Every day | NASAL | 12 refills | Status: DC

## 2023-06-03 NOTE — Assessment & Plan Note (Signed)
 The cause of his anemia is multifactorial He is not symptomatic Observe closely for now He may need blood transfusion if he remained anemic with hemoglobin less than 8 tomorrow

## 2023-06-03 NOTE — Progress Notes (Signed)
 HEMATOLOGY-ONCOLOGY ELECTRONIC VISIT PROGRESS NOTE  Patient Care Team: Loreli Kins, MD as PCP - General (Family Medicine) Lavona Agent, MD as PCP - Cardiology (Cardiology)  I connected with the patient via telephone conference and verified that I am speaking with the correct person using two identifiers. The patient's location is at home and I am providing care from the [facility] I discussed the limitations, risks, security and privacy concerns of performing an evaluation and management service by e-visits and the availability of in person appointments.  I also discussed with the patient that there may be a patient responsible charge related to this service. The patient expressed understanding and agreed to proceed.   ASSESSMENT & PLAN:  Multiple myeloma without remission (HCC) His treatment was delayed last week due to pneumonia He felt better and is ready to resume chemotherapy tomorrow as scheduled From my perspective, there is no contraindication for him to proceed I plan to see him next week as scheduled  Deficiency anemia The cause of his anemia is multifactorial He is not symptomatic Observe closely for now He may need blood transfusion if he remained anemic with hemoglobin less than 8 tomorrow  Post-nasal drip Recommend trial of Nasacort  to help with his symptoms  Orders Placed This Encounter  Procedures   Sample to Blood Bank    Standing Status:   Standing    Number of Occurrences:   33    Expiration Date:   06/02/2024    INTERVAL HISTORY: Please see below for problem oriented charting. The purpose of today's discussion is for further follow-up The patient was seen last week and treatment was delayed due to pneumonia His cough has resolved but he still have significant nasal drip No fever or chills His oxygen saturation is good He has some rib pain due to chronic coughing but he has oxycodone  to take as needed.  He is drinking lots of water  and denies  recent problem with constipation  SUMMARY OF ONCOLOGIC HISTORY: Oncology History Overview Note  Myeloma FISH was neg Normal cytogenetics   Multiple myeloma without remission (HCC)  07/11/2013 Initial Diagnosis   Multiple myeloma without remission (HCC)   04/22/2023 PET scan   NM PET Image Initial (PI) Whole Body (F-18 FDG)  Result Date: 05/03/2023 CLINICAL DATA:  Subsequent treatment strategy for multiple myeloma. EXAM: NUCLEAR MEDICINE PET WHOLE BODY TECHNIQUE: 13 mCi F-18 FDG was injected intravenously. Full-ring PET imaging was performed from the head to foot after the radiotracer. CT data was obtained and used for attenuation correction and anatomic localization. Fasting blood glucose: 114 mg/dl COMPARISON:  None Available. FINDINGS: Mediastinal blood pool activity: SUV max 2.0 HEAD/NECK: No hypermetabolic activity in the scalp. No hypermetabolic cervical lymph nodes. Incidental CT findings: Is CHEST: No hypermetabolic mediastinal or hilar nodes. No suspicious pulmonary nodules on the CT scan. Incidental CT findings: none ABDOMEN/PELVIS: No abnormal hypermetabolic activity within the liver, pancreas, adrenal glands, or spleen. No hypermetabolic lymph nodes in the abdomen or pelvis. Incidental CT findings: none SKELETON: There multiple foci focal radiotracer activity within the skeleton. Lesions are accompaniedlytic change on CT portion exam. There approximately 50 lesions. For example lesion the posterior RIGHT fifth rib with SUV max equal 3.6 on image 120. There is a lucent lesion and pathologic fracture associated with hypermetabolic activity the posterior LEFT sixth rib with SUV max equal 6.4 on image 129. Multiple lesions throughout the spine. Most intense lesion spinal lesion has associated soft tissue expansion at the the T2 vertebral body  measuring 2.5 cm (107) with SUV max equal 7.3 Lesion in the inferior LEFT pubic rami subtle lucency on image 299. Suspicious lesions in LEFT and RIGHT  femoral necks. Incidental CT findings: none EXTREMITIES: Hypermetabolic lesions RIGHT humeral shaft. Lesion the proximal LEFT and RIGHT femurs. Incidental CT findings: none IMPRESSION: 1. Multiple hypermetabolic lytic lesions throughout the skeleton consistent with multiple myeloma. Approximately 50 lesions within the axillary and appendicular skeleton. Many lesions accompanied lytic/lucent lesions on CT. 2. No evidence of soft tissue plasmacytoma. Electronically Signed   By: Jackquline Boxer M.D.   On: 05/03/2023 15:16   CT Chest W Contrast  Result Date: 05/03/2023 CLINICAL DATA:  Pneumonia with complication suspected. X-ray done. Cough with rib pain after coughing. Shortness of breath and right-sided pain. History of multiple myeloma. EXAM: CT CHEST WITH CONTRAST TECHNIQUE: Multidetector CT imaging of the chest was performed during intravenous contrast administration. RADIATION DOSE REDUCTION: This exam was performed according to the departmental dose-optimization program which includes automated exposure control, adjustment of the mA and/or kV according to patient size and/or use of iterative reconstruction technique. CONTRAST:  60mL OMNIPAQUE  IOHEXOL  300 MG/ML  SOLN COMPARISON:  Chest radiograph 05/02/2023.  PET-CT 04/22/2023 FINDINGS: Cardiovascular: Mild cardiac enlargement. No pericardial effusions. Normal caliber thoracic aorta. Calcification of the aorta and coronary arteries. Mediastinum/Nodes: Thyroid  gland is unremarkable. Esophagus is decompressed. Scattered mediastinal lymph nodes are not pathologically enlarged. Lungs/Pleura: Motion artifact limits examination. There is evidence of atelectasis or consolidation in both lung bases. Small bilateral pleural effusions. Changes could represent pneumonia. No pneumothorax. Upper Abdomen: No acute abnormalities. Musculoskeletal: Degenerative changes in the spine and shoulders. Multiple lucent lesions demonstrated in the ribs, spine, and shoulders  consistent with history of myeloma. Fractures of the right lateral fifth, sixth, and seventh ribs as well as the right tenth rib. Fractures of the left third, fourth, and sixth ribs. No vertebral compression deformities are demonstrated although large lucent lesions are demonstrated in the T1 and T2 vertebrae. IMPRESSION: 1. Multiple lucent lesions demonstrated in the ribs, spine, and shoulders corresponding to the patient's history of multiple myeloma. 2. Multiple bilateral acute rib fractures. 3. Small bilateral pleural effusions with basilar atelectasis or consolidation, possibly pneumonia. Electronically Signed   By: Elsie Gravely M.D.   On: 05/03/2023 01:21   DG Chest Port 1 View  Result Date: 05/02/2023 CLINICAL DATA:  Shortness of breath and cough. History of multiple myeloma. Right-sided pain. EXAM: PORTABLE CHEST 1 VIEW COMPARISON:  04/20/2023 FINDINGS: Shallow inspiration. Cardiac enlargement. No vascular congestion or edema. Linear atelectasis in the lung bases. No pleural effusions. No pneumothorax. Calcification of the aorta. A nondisplaced right lateral ninth rib fracture is suggested. IMPRESSION: Shallow inspiration with atelectasis in the lung bases. Cardiac enlargement. Probable right ninth rib fracture. Electronically Signed   By: Elsie Gravely M.D.   On: 05/02/2023 23:45   CT BONE MARROW BIOPSY & ASPIRATION  Result Date: 04/27/2023 INDICATION: myeloma staging EXAM: CT GUIDED BONE MARROW ASPIRATION AND CORE BIOPSY MEDICATIONS: None. ANESTHESIA/SEDATION: Moderate (conscious) sedation was employed during this procedure. A total of Versed  2 mg and Fentanyl  100 mcg was administered intravenously. Moderate Sedation Time: 13 minutes. The patient's level of consciousness and vital signs were monitored continuously by radiology nursing throughout the procedure under my direct supervision. FLUOROSCOPY TIME:  CT dose; 122 mGycm COMPLICATIONS: None immediate. Estimated blood loss: <5 mL  PROCEDURE: RADIATION DOSE REDUCTION: This exam was performed according to the departmental dose-optimization program which includes automated exposure control, adjustment of the  mA and/or kV according to patient size and/or use of iterative reconstruction technique. Informed written consent was obtained from the patient after a thorough discussion of the procedural risks, benefits and alternatives. All questions were addressed. Maximal Sterile Barrier Technique was utilized including caps, mask, sterile gowns, sterile gloves, sterile drape, hand hygiene and skin antiseptic. A timeout was performed prior to the initiation of the procedure. The patient was positioned prone and non-contrast localization CT was performed of the pelvis to demonstrate the iliac marrow spaces. Maximal barrier sterile technique utilized including caps, mask, sterile gowns, sterile gloves, large sterile drape, hand hygiene, and chlorhexidine  prep. Under sterile conditions and local anesthesia, an 11 gauge coaxial bone biopsy needle was advanced into the RIGHT iliac marrow space. Needle position was confirmed with CT imaging. Initially, bone marrow aspiration was performed. Next, the 11 gauge outer cannula was utilized to obtain a 1 iliac bone marrow core biopsy. Needle was removed. Hemostasis was obtained with compression. The patient tolerated the procedure well. Samples were prepared with the cytotechnologist. IMPRESSION: Successful CT-guided bone marrow aspiration and biopsy. Thom Hall, MD [facility] Radiology Electronically Signed   By: Thom Hall M.D.   On: 04/27/2023 12:32   DG Chest 2 View  Result Date: 04/23/2023 CLINICAL DATA:  Cough for 2 months Pain between shoulder blades EXAM: CHEST - 2 VIEW COMPARISON:  08/24/2019 FINDINGS: Cardiomediastinal silhouette and pulmonary vasculature are within normal limits. Lungs are hyperexpanded but otherwise clear. Unchanged dextroconvex  curvature of the thoracolumbar spine. IMPRESSION: 1. No acute cardiopulmonary process. 2. Hyperexpanded lungs, suspicious for emphysema. Electronically Signed   By: Aliene Lloyd M.D.   On: 04/23/2023 16:44      04/27/2023 Pathology Results   Surgical Pathology CASE: [id number] PATIENT: ELSIE CODDING Bone Marrow Report  Clinical History: Myeloma  DIAGNOSIS:  BONE MARROW, ASPIRATE, CLOT, CORE: -Hypercellular bone marrow with plasma cell neoplasm -See comment  PERIPHERAL BLOOD: -Normocytic-normochromic anemia -Leukocytosis with neutrophilic left shift  COMMENT:  The bone marrow is hypercellular for age with increased number of plasma cells averaging 15% of all cells in the aspirate.  The clot and biopsy sections show interstitial infiltrates, numerous variably sized clusters in addition to an area of diffuse sheet of atypical plasma cells associated with kappa light chain restriction consistent with plasma cell neoplasm.  Correlation with cytogenetic and FISH studies is recommended.  MICROSCOPIC DESCRIPTION:  PERIPHERAL BLOOD SMEAR: The red blood cells display mild anisopoikilocytosis with mild to moderate polychromasia.  The white blood cells are slightly increased in number, mostly with neutrophils some of which display toxic granulation.  An occasional myelocyte is seen on scan.  The platelets are normal in number.  BONE MARROW ASPIRATE: Bone marrow particles present Erythroid precursors: Orderly and progressive maturation Granulocytic precursors: Orderly and progressive maturation for the most part Megakaryocytes: Abundant with scattered small and/or hypolobated forms or large cells. Lymphocytes/plasma cells: The plasma cells are increased in number representing 15% to all cells associated with atypical cytologic features characterized by cytomegaly and/or small nucleoli.  Large lymphoid aggregates are not seen.  TOUCH PREPARATIONS: A mixture of cell types present but with  increased number of plasma cells  CLOT AND BIOPSY: The sections show variable cellularity ranging from 40 to 90% focally.  There are numerous variably sized clusters of atypical plasma cells in addition to a diffuse large sheet particularly in the area of most cellularity.  The background shows trilineage hematopoiesis.  Immunohistochemical stains for CD138 in addition to  in situ hybridization for kappa and lambda were performed with appropriate controls.  CD138 highlights the increased plasma cell component consisting of interstitial infiltrates, numerous variably sized clusters, and an area of diffuse sheet particularly in the core biopsy. The plasma cells display kappa light chain restriction.  IRON STAIN: Iron stains are performed on a bone marrow aspirate or touch imprint smear and section of clot. The controls stained appropriately.       Storage Iron: Decreased      Ring Sideroblasts: Absent  ADDITIONAL DATA/TESTING: The specimen was sent for cytogenetic analysis and FISH for multiple myeloma and a separate report will follow.    05/04/2023 Cancer Staging   Staging form: Plasma Cell Myeloma and Plasma Cell Disorders, AJCC 8th Edition - Clinical stage from 05/04/2023: RISS Stage II (Beta-2 -microglobulin (mg/L): 5.6, Albumin  (g/dL): 2.5, ISS: Stage III, High-risk cytogenetics: Absent, LDH: Normal) - Signed by Lonn Hicks, MD on 05/11/2023 Stage prefix: Initial diagnosis Beta 2 microglobulin range (mg/L): Greater than or equal to 5.5 Albumin  range (g/dL): Less than 3.5 Cytogenetics: No abnormalities   05/14/2023 -  Chemotherapy   Patient is on Treatment Plan : MYELOMA NEWLY DIAGNOSED TRANSPLANT CANDIDATE DaraVRd (Daratumumab  SQ) q21d x 6 Cycles (Induction/Consolidation)       REVIEW OF SYSTEMS:   Constitutional: Denies fevers, chills or abnormal weight loss Eyes: Denies blurriness of vision Ears, nose, mouth, throat, and face: Denies mucositis or sore throat Cardiovascular: Denies  palpitation, chest discomfort Gastrointestinal:  Denies nausea, heartburn or change in bowel habits Skin: Denies abnormal skin rashes Lymphatics: Denies new lymphadenopathy or easy bruising Neurological:Denies numbness, tingling or new weaknesses Behavioral/Psych: Mood is stable, no new changes  Extremities: No lower extremity edema All other systems were reviewed with the patient and are negative.  I have reviewed the past medical history, past surgical history, social history and family history with the patient and they are unchanged from previous note.  ALLERGIES:  is allergic to atorvastatin, cymbalta  [duloxetine  hcl], and penicillins.  MEDICATIONS:  Current Outpatient Medications  Medication Sig Dispense Refill   triamcinolone  (NASACORT ) 55 MCG/ACT AERO nasal inhaler Place 2 sprays into the nose daily. 1 each 12   acyclovir  (ZOVIRAX ) 400 MG tablet Take 1 tablet (400 mg total) by mouth daily. 30 tablet 6   albuterol  (VENTOLIN  HFA) 108 (90 Base) MCG/ACT inhaler Inhale 1-2 puffs into the lungs every 6 (six) hours as needed for wheezing or shortness of breath.     allopurinol  (ZYLOPRIM ) 300 MG tablet Take 1 tablet (300 mg total) by mouth daily. 30 tablet 0   aspirin  EC 81 MG tablet Take 1 tablet (81 mg total) by mouth daily. 100 tablet 3   brimonidine  (ALPHAGAN ) 0.2 % ophthalmic solution Place 1 drop into the left eye in the morning and at bedtime.     carvedilol  (COREG ) 3.125 MG tablet TAKE 1 TABLET BY MOUTH TWICE DAILY 180 tablet 3   cetirizine (ZYRTEC) 10 MG tablet Take 10 mg by mouth every morning.     Cholecalciferol  (VITAMIN D3) 50 MCG (2000 UT) TABS Take 2,000 Units by mouth daily.     cyclobenzaprine  (FLEXERIL ) 10 MG tablet Take 10 mg by mouth 3 (three) times daily as needed for muscle spasms.     dexamethasone  (DECADRON ) 4 MG tablet Take 5 pills once a week on Fridays. On 12/23, 12/25, 12/30 and 1/1, take 1/2 tab     docusate sodium  (COLACE) 100 MG capsule Take 100-200 mg by  mouth 2 (two) times daily as  needed for mild constipation.     dorzolamide -timolol  (COSOPT ) 22.3-6.8 MG/ML ophthalmic solution Place 1 drop into both eyes 2 (two) times daily.     furosemide  (LASIX ) 20 MG tablet Take 1 tab on Mondays, Wednesdays and Fridays     ketoconazole (NIZORAL) 2 % cream Apply 1 application  topically daily as needed for irritation.     latanoprost  (XALATAN ) 0.005 % ophthalmic solution Place 1 drop into both eyes at bedtime.     lenalidomide  (REVLIMID ) 10 MG capsule Take 1 capsule (10 mg total) by mouth daily. [employer] Auth # [id number]     Date Obtained 05/31/2023 Take 1 capsule daily for 14 days on and 7 days off repeat every 21 days 14 capsule 0   lidocaine  (XYLOCAINE ) 5 % ointment Apply 1 Application topically 2 (two) times daily as needed. (Patient taking differently: Apply 1 Application topically 2 (two) times daily as needed (for foot pain).) 35.44 g 1   magnesium  oxide (MAG-OX) 400 (240 Mg) MG tablet Take 400 mg by mouth daily.     Multiple Vitamins-Minerals (CENTRUM SILVER 50+MEN) TABS Take 1 tablet by mouth daily with breakfast.     nitroGLYCERIN  (NITROSTAT ) 0.4 MG SL tablet Place 1 tablet (0.4 mg total) under the tongue every 5 (five) minutes as needed. (Patient taking differently: Place 0.4 mg under the tongue every 5 (five) minutes as needed for chest pain.) 25 tablet 2   Omega-3 Fatty Acids (FISH OIL) 1200 MG CAPS Take 3,600 mg by mouth daily. 3     ondansetron  (ZOFRAN ) 8 MG tablet Take 1 tablet (8 mg total) by mouth every 8 (eight) hours as needed for nausea or vomiting. 30 tablet 1   Oxycodone  HCl 10 MG TABS Take 1 tablet (10 mg total) by mouth every 4 (four) hours as needed for severe pain (pain score 7-10). 90 tablet 0   pantoprazole  (PROTONIX ) 40 MG tablet Take 40 mg by mouth daily before breakfast.     polyethylene glycol (MIRALAX  / GLYCOLAX ) 17 g packet Take 17 g by mouth daily.     pregabalin  (LYRICA ) 100 MG capsule TAKE 1 CAPSULE BY MOUTH IN THE MORNING, AND  AT NOON, AND 2 CAPSULES AT NIGHT (Patient taking differently: Take 100 mg by mouth in the morning and at bedtime.) 120 capsule 5   prochlorperazine  (COMPAZINE ) 10 MG tablet Take 1 tablet (10 mg total) by mouth every 6 (six) hours as needed for nausea or vomiting. 30 tablet 1   rosuvastatin  (CRESTOR ) 40 MG tablet TAKE 1 TABLET BY MOUTH DAILY 90 tablet 0   senna-docusate (SENOKOT-S) 8.6-50 MG tablet Take 1 tablet by mouth 2 (two) times daily.     TYLENOL  500 MG tablet Take 500-1,000 mg by mouth every 6 (six) hours as needed for mild pain (pain score 1-3) or headache.     No current facility-administered medications for this visit.    PHYSICAL EXAMINATION: ECOG PERFORMANCE STATUS: 1 - Symptomatic but completely ambulatory  LABORATORY DATA:  I have reviewed the data as listed    Latest Ref Rng & Units 05/28/2023   12:29 PM 05/21/2023   11:58 AM 05/11/2023    2:40 PM  CMP  Glucose 70 - 99 mg/dL 99  855  854   BUN 8 - 23 mg/dL 36  37  48   Creatinine 0.61 - 1.24 mg/dL 8.85  8.95  8.64   Sodium 135 - 145 mmol/L 136  135  138   Potassium 3.5 - 5.1 mmol/L 3.8  4.5  4.4   Chloride 98 - 111 mmol/L 98  100  104   CO2 22 - 32 mmol/L 29  26  26    Calcium  8.9 - 10.3 mg/dL 89.4  89.9  8.8   Total Protein 6.5 - 8.1 g/dL 7.7  8.4  8.7   Total Bilirubin <1.2 mg/dL 0.4  0.3  0.3   Alkaline Phos 38 - 126 U/L 90  101  97   AST 15 - 41 U/L 15  11  22    ALT 0 - 44 U/L 32  18  24     Lab Results  Component Value Date   WBC 3.8 (L) 05/28/2023   HGB 8.3 (L) 05/28/2023   HCT 25.3 (L) 05/28/2023   MCV 86.9 05/28/2023   PLT 167 05/28/2023   NEUTROABS 2.3 05/28/2023     RADIOGRAPHIC STUDIES: I have personally reviewed the radiological images as listed and agreed with the findings in the report. DG Chest 2 View Result Date: 05/25/2023 CLINICAL DATA:  Acute cough.  Shortness of breath. EXAM: CHEST - 2 VIEW COMPARISON:  Most recent radiograph 05/02/2023, CT 05/03/2023 FINDINGS: Vague opacity in the  periphery of the left upper lobe is new from prior exam and may represent acute infection. Small left pleural effusion and basilar opacity is unchanged from prior exam. Previous right pleural effusion is not definitively seen by radiograph. Known bilateral rib fractures, some of which demonstrate interval callus formation from earlier this month. Stable heart size and mediastinal contours. No pneumothorax. IMPRESSION: 1. Vague opacity in the periphery of the left upper lobe is new from prior exam and may represent acute infection. 2. Small left pleural effusion and basilar opacity is unchanged from prior exam. 3. Known bilateral rib fractures, some of which demonstrate interval callus formation from earlier this month. Electronically Signed   By: Andrea Gasman M.D.   On: 05/25/2023 07:15   US  RENAL Result Date: 05/04/2023 CLINICAL DATA:  Acute kidney injury EXAM: RENAL / URINARY TRACT ULTRASOUND COMPLETE COMPARISON:  PET-CT 04/22/2023 FINDINGS: Right Kidney: Renal measurements: 14.1 x 6 x 6.7 cm = volume: 301 mL. Echogenicity within normal limits. No mass or hydronephrosis visualized. Left Kidney: Renal measurements: 14.1 x 6 x 6.6 cm = volume: 288 mL. Echogenicity within normal limits. No mass or hydronephrosis visualized. 4 mm echogenic focus demonstrated in the lower pole consistent with a stone. Bladder: Appears normal for degree of bladder distention. Other: None. IMPRESSION: 1. 4 mm nonobstructing stone in the lower pole left kidney. 2. No hydronephrosis or solid mass in either kidney. Electronically Signed   By: Elsie Gravely M.D.   On: 05/04/2023 23:49    I discussed the assessment and treatment plan with the patient. The patient was provided an opportunity to ask questions and all were answered. The patient agreed with the plan and demonstrated an understanding of the instructions. The patient was advised to call back or seek an in-person evaluation if the symptoms worsen or if the condition  fails to improve as anticipated.    I spent 25 minutes for the appointment reviewing test results, discuss management and coordination of care.  [name], MD 06/03/2023 3:14 PM

## 2023-06-03 NOTE — Assessment & Plan Note (Signed)
 His treatment was delayed last week due to pneumonia He felt better and is ready to resume chemotherapy tomorrow as scheduled From my perspective, there is no contraindication for him to proceed I plan to see him next week as scheduled

## 2023-06-03 NOTE — Telephone Encounter (Signed)
 Called to see how he is doing. He is feeling better. He would like a phone visit today at 3 pm. Appt scheduled.

## 2023-06-03 NOTE — Telephone Encounter (Signed)
 Called and left a message asking him to call the office back with update on how he is feeling.

## 2023-06-03 NOTE — Assessment & Plan Note (Signed)
 Recommend trial of Nasacort to help with his symptoms

## 2023-06-04 ENCOUNTER — Other Ambulatory Visit: Payer: Self-pay

## 2023-06-04 ENCOUNTER — Inpatient Hospital Stay: Payer: Medicare Other

## 2023-06-04 ENCOUNTER — Telehealth: Payer: Self-pay

## 2023-06-04 VITALS — BP 106/54 | HR 77 | Temp 98.1°F | Resp 18 | Wt 224.0 lb

## 2023-06-04 DIAGNOSIS — C9000 Multiple myeloma not having achieved remission: Secondary | ICD-10-CM

## 2023-06-04 DIAGNOSIS — D638 Anemia in other chronic diseases classified elsewhere: Secondary | ICD-10-CM

## 2023-06-04 DIAGNOSIS — Z79899 Other long term (current) drug therapy: Secondary | ICD-10-CM | POA: Diagnosis not present

## 2023-06-04 DIAGNOSIS — D801 Nonfamilial hypogammaglobulinemia: Secondary | ICD-10-CM | POA: Diagnosis not present

## 2023-06-04 DIAGNOSIS — M79604 Pain in right leg: Secondary | ICD-10-CM | POA: Diagnosis not present

## 2023-06-04 DIAGNOSIS — N179 Acute kidney failure, unspecified: Secondary | ICD-10-CM

## 2023-06-04 DIAGNOSIS — Z7961 Long term (current) use of immunomodulator: Secondary | ICD-10-CM | POA: Diagnosis not present

## 2023-06-04 DIAGNOSIS — Z5112 Encounter for antineoplastic immunotherapy: Secondary | ICD-10-CM | POA: Diagnosis not present

## 2023-06-04 DIAGNOSIS — G893 Neoplasm related pain (acute) (chronic): Secondary | ICD-10-CM | POA: Diagnosis not present

## 2023-06-04 DIAGNOSIS — R0601 Orthopnea: Secondary | ICD-10-CM | POA: Diagnosis not present

## 2023-06-04 DIAGNOSIS — D539 Nutritional anemia, unspecified: Secondary | ICD-10-CM | POA: Diagnosis not present

## 2023-06-04 LAB — CBC WITH DIFFERENTIAL (CANCER CENTER ONLY)
Abs Immature Granulocytes: 0.02 10*3/uL (ref 0.00–0.07)
Basophils Absolute: 0.1 10*3/uL (ref 0.0–0.1)
Basophils Relative: 2 %
Eosinophils Absolute: 0.2 10*3/uL (ref 0.0–0.5)
Eosinophils Relative: 3 %
HCT: 24.7 % — ABNORMAL LOW (ref 39.0–52.0)
Hemoglobin: 8.1 g/dL — ABNORMAL LOW (ref 13.0–17.0)
Immature Granulocytes: 0 %
Lymphocytes Relative: 16 %
Lymphs Abs: 0.8 10*3/uL (ref 0.7–4.0)
MCH: 28.5 pg (ref 26.0–34.0)
MCHC: 32.8 g/dL (ref 30.0–36.0)
MCV: 87 fL (ref 80.0–100.0)
Monocytes Absolute: 0.5 10*3/uL (ref 0.1–1.0)
Monocytes Relative: 10 %
Neutro Abs: 3.6 10*3/uL (ref 1.7–7.7)
Neutrophils Relative %: 69 %
Platelet Count: 318 10*3/uL (ref 150–400)
RBC: 2.84 MIL/uL — ABNORMAL LOW (ref 4.22–5.81)
RDW: 16.1 % — ABNORMAL HIGH (ref 11.5–15.5)
WBC Count: 5.2 10*3/uL (ref 4.0–10.5)
nRBC: 0 % (ref 0.0–0.2)

## 2023-06-04 LAB — BASIC METABOLIC PANEL - CANCER CENTER ONLY
Anion gap: 8 (ref 5–15)
BUN: 36 mg/dL — ABNORMAL HIGH (ref 8–23)
CO2: 28 mmol/L (ref 22–32)
Calcium: 11 mg/dL — ABNORMAL HIGH (ref 8.9–10.3)
Chloride: 104 mmol/L (ref 98–111)
Creatinine: 1.07 mg/dL (ref 0.61–1.24)
GFR, Estimated: >60 mL/min (ref >60)
Glucose, Bld: 129 mg/dL — ABNORMAL HIGH (ref 70–99)
Potassium: 4.1 mmol/L (ref 3.5–5.1)
Sodium: 140 mmol/L (ref 135–145)

## 2023-06-04 LAB — SAMPLE TO BLOOD BANK

## 2023-06-04 MED ORDER — ACETAMINOPHEN 325 MG PO TABS
650.0000 mg | ORAL_TABLET | Freq: Once | ORAL | Status: AC
Start: 2023-06-04 — End: 2023-06-04
  Administered 2023-06-04: 650 mg via ORAL
  Filled 2023-06-04: qty 2

## 2023-06-04 MED ORDER — MONTELUKAST SODIUM 10 MG PO TABS
10.0000 mg | ORAL_TABLET | Freq: Once | ORAL | Status: AC
  Administered 2023-06-04: 10 mg via ORAL
  Filled 2023-06-04: qty 1

## 2023-06-04 MED ORDER — DIPHENHYDRAMINE HCL 25 MG PO CAPS
25.0000 mg | ORAL_CAPSULE | Freq: Once | ORAL | Status: AC
Start: 2023-06-04 — End: 2023-06-04
  Administered 2023-06-04: 25 mg via ORAL
  Filled 2023-06-04: qty 1

## 2023-06-04 MED ORDER — DARATUMUMAB-HYALURONIDASE-FIHJ 1800-30000 MG-UT/15ML ~~LOC~~ SOLN
1800.0000 mg | Freq: Once | SUBCUTANEOUS | Status: AC
  Administered 2023-06-04: 1800 mg via SUBCUTANEOUS
  Filled 2023-06-04: qty 15

## 2023-06-04 NOTE — Telephone Encounter (Signed)
 Called and spoke with Daniel Whitaker and granddaughter on the phone. Per Dr. Lonn, given calcium  results today. Instructed to resume 20 mg lasix  daily and resume nasal calcitonin alternate nose daily.  Instructed to increase water  intake. They both verbalized understanding.

## 2023-06-04 NOTE — Progress Notes (Signed)
 Patient took his steroid about 0900 today.

## 2023-06-04 NOTE — Patient Instructions (Signed)
 [facility] - A DEPT OF [facility]  Discharge Instructions: Thank you for choosing [facility] to provide your oncology and hematology care.   If you have a lab appointment with the Cancer Center, please go directly to the Cancer Center and check in at the registration area.   Wear comfortable clothing and clothing appropriate for easy access to any Portacath or PICC line.   We strive to give you quality time with your provider. You may need to reschedule your appointment if you arrive late (15 or more minutes).  Arriving late affects you and other patients whose appointments are after yours.  Also, if you miss three or more appointments without notifying the office, you may be dismissed from the clinic at the provider's discretion.      For prescription refill requests, have your pharmacy contact our office and allow 72 hours for refills to be completed.    Today you received the following chemotherapy and/or immunotherapy agents Velcade, Faspro      To help prevent nausea and vomiting after your treatment, we encourage you to take your nausea medication as directed.  BELOW ARE SYMPTOMS THAT SHOULD BE REPORTED IMMEDIATELY: *FEVER GREATER THAN 100.4 F (38 C) OR HIGHER *CHILLS OR SWEATING *NAUSEA AND VOMITING THAT IS NOT CONTROLLED WITH YOUR NAUSEA MEDICATION *UNUSUAL SHORTNESS OF BREATH *UNUSUAL BRUISING OR BLEEDING *URINARY PROBLEMS (pain or burning when urinating, or frequent urination) *BOWEL PROBLEMS (unusual diarrhea, constipation, pain near the anus) TENDERNESS IN MOUTH AND THROAT WITH OR WITHOUT PRESENCE OF ULCERS (sore throat, sores in mouth, or a toothache) UNUSUAL RASH, SWELLING OR PAIN  UNUSUAL VAGINAL DISCHARGE OR ITCHING   Items with * indicate a potential emergency and should be followed up as soon as possible or go to the Emergency Department if any problems should occur.  Please show the CHEMOTHERAPY ALERT CARD or  IMMUNOTHERAPY ALERT CARD at check-in to the Emergency Department and triage nurse.  Should you have questions after your visit or need to cancel or reschedule your appointment, please contact [facility] - A DEPT OF [facility]  Dept: [phone]  and follow the prompts.  Office hours are 8:00 a.m. to 4:30 p.m. Monday - Friday. Please note that voicemails left after 4:00 p.m. may not be returned until the following business day.  We are closed weekends and major holidays. You have access to a nurse at all times for urgent questions. Please call the main number to the clinic Dept: [phone] and follow the prompts.   For any non-urgent questions, you may also contact your provider using MyChart. We now offer e-Visits for anyone 48 and older to request care online for non-urgent symptoms. For details visit [email or web address].   Also download the MyChart app! Go to the app store, search "MyChart", open the app, select [place], and log in with your MyChart username and password.  Bortezomib Injection What is this medication? BORTEZOMIB (bor TEZ oh mib) treats lymphoma. It may also be used to treat multiple myeloma, a type of bone marrow cancer. It works by blocking a protein that causes cancer cells to grow and multiply. This helps to slow or stop the spread of cancer cells. This medicine may be used for other purposes; ask your health care provider or pharmacist if you have questions. COMMON BRAND NAME(S): Velcade What should I tell my care team before I take this medication? They need to  know if you have any of these conditions: Dehydration Diabetes Heart disease Liver disease Tingling of the fingers or toes or other nerve disorder An unusual or allergic reaction to bortezomib, other medications, foods, dyes, or preservatives If you or your partner are pregnant or trying to get pregnant Breastfeeding How should I use this medication? This medication  is injected into a vein or under the skin. It is given by your care team in a hospital or clinic setting. Talk to your care team about the use of this medication in children. Special care may be needed. Overdosage: If you think you have taken too much of this medicine contact a poison control center or emergency room at once. NOTE: This medicine is only for you. Do not share this medicine with others. What if I miss a dose? Keep appointments for follow-up doses. It is important not to miss your dose. Call your care team if you are unable to keep an appointment. What may interact with this medication? Ketoconazole Rifampin This list may not describe all possible interactions. Give your health care provider a list of all the medicines, herbs, non-prescription drugs, or dietary supplements you use. Also tell them if you smoke, drink alcohol, or use illegal drugs. Some items may interact with your medicine. What should I watch for while using this medication? Your condition will be monitored carefully while you are receiving this medication. You may need blood work while taking this medication. This medication may affect your coordination, reaction time, or judgment. Do not drive or operate machinery until you know how this medication affects you. Sit up or stand slowly to reduce the risk of dizzy or fainting spells. Drinking alcohol with this medication can increase the risk of these side effects. This medication may increase your risk of getting an infection. Call your care team for advice if you get a fever, chills, sore throat, or other symptoms of a cold or flu. Do not treat yourself. Try to avoid being around people who are sick. Check with your care team if you have severe diarrhea, nausea, and vomiting, or if you sweat a lot. The loss of too much body fluid may make it dangerous for you to take this medication. Talk to your care team if you may be pregnant. Serious birth defects can occur if you  take this medication during pregnancy and for 7 months after the last dose. You will need a negative pregnancy test before starting this medication. Contraception is recommended while taking this medication and for 7 months after the last dose. Your care team can help you find the option that works for you. If your partner can get pregnant, use a condom during sex while taking this medication and for 4 months after the last dose. Do not breastfeed while taking this medication and for 2 months after the last dose. This medication may cause infertility. Talk to your care team if you are concerned about your fertility. What side effects may I notice from receiving this medication? Side effects that you should report to your care team as soon as possible: Allergic reactions--skin rash, itching, hives, swelling of the face, lips, tongue, or throat Bleeding--bloody or black, tar-like stools, vomiting blood or brown material that looks like coffee grounds, red or dark brown urine, small red or purple spots on skin, unusual bruising or bleeding Bleeding in the brain--severe headache, stiff neck, confusion, dizziness, change in vision, numbness or weakness of the face, arm, or leg, trouble speaking, trouble walking,  vomiting Bowel blockage--stomach cramping, unable to have a bowel movement or pass gas, loss of appetite, vomiting Heart failure--shortness of breath, swelling of the ankles, feet, or hands, sudden weight gain, unusual weakness or fatigue Infection--fever, chills, cough, sore throat, wounds that don't heal, pain or trouble when passing urine, general feeling of discomfort or being unwell Liver injury--right upper belly pain, loss of appetite, nausea, light-colored stool, dark yellow or brown urine, yellowing skin or eyes, unusual weakness or fatigue Low blood pressure--dizziness, feeling faint or lightheaded, blurry vision Lung injury--shortness of breath or trouble breathing, cough, spitting up  blood, chest pain, fever Pain, tingling, or numbness in the hands or feet Severe or prolonged diarrhea Stomach pain, bloody diarrhea, pale skin, unusual weakness or fatigue, decrease in the amount of urine, which may be signs of hemolytic uremic syndrome Sudden and severe headache, confusion, change in vision, seizures, which may be signs of posterior reversible encephalopathy syndrome (PRES) TTP--purple spots on the skin or inside the mouth, pale skin, yellowing skin or eyes, unusual weakness or fatigue, fever, fast or irregular heartbeat, confusion, change in vision, trouble speaking, trouble walking Tumor lysis syndrome (TLS)--nausea, vomiting, diarrhea, decrease in the amount of urine, dark urine, unusual weakness or fatigue, confusion, muscle pain or cramps, fast or irregular heartbeat, joint pain Side effects that usually do not require medical attention (report to your care team if they continue or are bothersome): Constipation Diarrhea Fatigue Loss of appetite Nausea This list may not describe all possible side effects. Call your doctor for medical advice about side effects. You may report side effects to FDA at 1-800-FDA-1088. Where should I keep my medication? This medication is given in a hospital or clinic. It will not be stored at home. NOTE: This sheet is a summary. It may not cover all possible information. If you have questions about this medicine, talk to your doctor, pharmacist, or health care provider.  2024 Elsevier/Gold Standard (2021-10-21 00:00:00) Daratumumab; Hyaluronidase Injection What is this medication? DARATUMUMAB; HYALURONIDASE (dar a toom ue mab; hye al ur ON i dase) treats multiple myeloma, a type of bone marrow cancer. Daratumumab works by blocking a protein that causes cancer cells to grow and multiply. This helps to slow or stop the spread of cancer cells. Hyaluronidase works by increasing the absorption of other medications in the body to help them work  better. This medication may also be used treat amyloidosis, a condition that causes the buildup of a protein (amyloid) in your body. It works by reducing the buildup of this protein, which decreases symptoms. It is a combination medication that contains a monoclonal antibody. This medicine may be used for other purposes; ask your health care provider or pharmacist if you have questions. COMMON BRAND NAME(S): DARZALEX FASPRO What should I tell my care team before I take this medication? They need to know if you have any of these conditions: Heart disease Infection, such as chickenpox, cold sores, herpes, hepatitis B Lung or breathing disease An unusual or allergic reaction to daratumumab, hyaluronidase, other medications, foods, dyes, or preservatives Pregnant or trying to get pregnant Breast-feeding How should I use this medication? This medication is injected under the skin. It is given by your care team in a hospital or clinic setting. Talk to your care team about the use of this medication in children. Special care may be needed. Overdosage: If you think you have taken too much of this medicine contact a poison control center or emergency room at once. NOTE: This medicine  is only for you. Do not share this medicine with others. What if I miss a dose? Keep appointments for follow-up doses. It is important not to miss your dose. Call your care team if you are unable to keep an appointment. What may interact with this medication? Interactions have not been studied. This list may not describe all possible interactions. Give your health care provider a list of all the medicines, herbs, non-prescription drugs, or dietary supplements you use. Also tell them if you smoke, drink alcohol, or use illegal drugs. Some items may interact with your medicine. What should I watch for while using this medication? Your condition will be monitored carefully while you are receiving this medication. This  medication can cause serious allergic reactions. To reduce your risk, your care team may give you other medication to take before receiving this one. Be sure to follow the directions from your care team. This medication can affect the results of blood tests to match your blood type. These changes can last for up to 6 months after the final dose. Your care team will do blood tests to match your blood type before you start treatment. Tell all of your care team that you are being treated with this medication before receiving a blood transfusion. This medication can affect the results of some tests used to determine treatment response; extra tests may be needed to evaluate response. Talk to your care team if you wish to become pregnant or think you are pregnant. This medication can cause serious birth defects if taken during pregnancy and for 3 months after the last dose. A reliable form of contraception is recommended while taking this medication and for 3 months after the last dose. Talk to your care team about effective forms of contraception. Do not breast-feed while taking this medication. What side effects may I notice from receiving this medication? Side effects that you should report to your care team as soon as possible: Allergic reactions--skin rash, itching, hives, swelling of the face, lips, tongue, or throat Heart rhythm changes--fast or irregular heartbeat, dizziness, feeling faint or lightheaded, chest pain, trouble breathing Infection--fever, chills, cough, sore throat, wounds that don't heal, pain or trouble when passing urine, general feeling of discomfort or being unwell Infusion reactions--chest pain, shortness of breath or trouble breathing, feeling faint or lightheaded Sudden eye pain or change in vision such as blurry vision, seeing halos around lights, vision loss Unusual bruising or bleeding Side effects that usually do not require medical attention (report to your care team if they  continue or are bothersome): Constipation Diarrhea Fatigue Nausea Pain, tingling, or numbness in the hands or feet Swelling of the ankles, hands, or feet This list may not describe all possible side effects. Call your doctor for medical advice about side effects. You may report side effects to FDA at 1-800-FDA-1088. Where should I keep my medication? This medication is given in a hospital or clinic. It will not be stored at home. NOTE: This sheet is a summary. It may not cover all possible information. If you have questions about this medicine, talk to your doctor, pharmacist, or health care provider.  2024 Elsevier/Gold Standard (2021-09-23 00:00:00)

## 2023-06-07 LAB — KAPPA/LAMBDA LIGHT CHAINS
Kappa free light chain: 631.8 mg/L — ABNORMAL HIGH (ref 3.3–19.4)
Kappa, lambda light chain ratio: 180.51 — ABNORMAL HIGH (ref 0.26–1.65)
Lambda free light chains: 3.5 mg/L — ABNORMAL LOW (ref 5.7–26.3)

## 2023-06-09 ENCOUNTER — Other Ambulatory Visit: Payer: Self-pay | Admitting: Hematology and Oncology

## 2023-06-09 ENCOUNTER — Telehealth: Payer: Self-pay | Admitting: Hematology and Oncology

## 2023-06-09 MED ORDER — FUROSEMIDE 20 MG PO TABS: 20.0000 mg | ORAL_TABLET | Freq: Every day | ORAL | 1 refills | Status: DC

## 2023-06-09 MED ORDER — ALLOPURINOL 300 MG PO TABS: 300.0000 mg | ORAL_TABLET | Freq: Every day | ORAL | 0 refills | Status: DC

## 2023-06-09 MED ORDER — ACYCLOVIR 400 MG PO TABS: 400.0000 mg | ORAL_TABLET | Freq: Every day | ORAL | 6 refills | Status: DC

## 2023-06-09 MED ORDER — OXYCODONE HCL 10 MG PO TABS: 10.0000 mg | ORAL_TABLET | ORAL | 0 refills | Status: DC | PRN

## 2023-06-09 NOTE — Telephone Encounter (Signed)
-----   Message from Nurse Rocky HERO sent at 06/09/2023  9:54 AM EST ----- Regarding: FW: Questions on Medications  ----- Message ----- From: Hennie Rocky SAILOR, RN Sent: 06/09/2023   9:35 AM EST To: [name], MD Subject: RE: Questions on Medications                   I spoke with [facility] and they can fill the prescription if you send it in. She said as long as he's an established patient and the quantities aren't exceeding 100+ they can typically fill it.  Did you want me to tell the family to continue the lasix  until you see them in the office on Friday? ----- Message ----- From: Bedford Almarie, MD Sent: 06/09/2023   8:58 AM EST To: Rocky SAILOR Hennie, RN Subject: RE: Questions on Medications                   For oxycodone  I can refill it but dont know if his local pharmacy would have it Can you call before I send it? Let me know which pharmacy and I will refill everything else ----- Message ----- From: Hennie Rocky SAILOR, RN Sent: 06/09/2023   8:56 AM EST To: [name], MD Subject: Questions on Medications                       Hello, This patient's daughter called with questions regarding his different medications. Patient was told to restart lasix  last Friday and they wanted to check to see how long he should continue on the lasix . They will need a refill by Friday if they continue it as prescribed currently. Also, they asked if they needed to continue with the allopurinol  and acyclovir  as they had finished the bottles on Thursday and had not yet refilled it.  In addition, they are requesting a refill on the patient's oxycodone  5 mg as they are almost out.  I'm assuming they will definitely need to refill the allopurinol  and acyclovir , but let me know if they need to continue with the lasix  or to wait until their appointment with you on Friday to determine if they are to continue based on his updated labs.  Thank you, Rocky

## 2023-06-09 NOTE — Telephone Encounter (Signed)
 TC to Pt's daughter to inform her that Pt should continue to take the lasix  and acyclovir  and a refill was sent to his pharmacy for the oxy 5 mg. Confirmed Pt will see Dr Lonn on Friday and will discuss the plan for the continuation of these medications. Daughter verbalized understanding.

## 2023-06-11 ENCOUNTER — Inpatient Hospital Stay: Payer: Medicare Other

## 2023-06-11 ENCOUNTER — Inpatient Hospital Stay (HOSPITAL_BASED_OUTPATIENT_CLINIC_OR_DEPARTMENT_OTHER): Payer: Medicare Other | Admitting: Hematology and Oncology

## 2023-06-11 ENCOUNTER — Other Ambulatory Visit: Payer: Self-pay

## 2023-06-11 ENCOUNTER — Telehealth: Payer: Self-pay

## 2023-06-11 VITALS — BP 133/56 | HR 77 | Temp 100.9°F | Resp 18 | Ht 73.0 in | Wt 233.0 lb

## 2023-06-11 DIAGNOSIS — M79604 Pain in right leg: Secondary | ICD-10-CM | POA: Diagnosis not present

## 2023-06-11 DIAGNOSIS — G893 Neoplasm related pain (acute) (chronic): Secondary | ICD-10-CM | POA: Diagnosis not present

## 2023-06-11 DIAGNOSIS — Z7961 Long term (current) use of immunomodulator: Secondary | ICD-10-CM | POA: Diagnosis not present

## 2023-06-11 DIAGNOSIS — C9000 Multiple myeloma not having achieved remission: Secondary | ICD-10-CM

## 2023-06-11 DIAGNOSIS — Z79899 Other long term (current) drug therapy: Secondary | ICD-10-CM | POA: Diagnosis not present

## 2023-06-11 DIAGNOSIS — Z5112 Encounter for antineoplastic immunotherapy: Secondary | ICD-10-CM | POA: Diagnosis not present

## 2023-06-11 DIAGNOSIS — R0601 Orthopnea: Secondary | ICD-10-CM | POA: Diagnosis not present

## 2023-06-11 DIAGNOSIS — D539 Nutritional anemia, unspecified: Secondary | ICD-10-CM | POA: Diagnosis not present

## 2023-06-11 DIAGNOSIS — D638 Anemia in other chronic diseases classified elsewhere: Secondary | ICD-10-CM

## 2023-06-11 DIAGNOSIS — D801 Nonfamilial hypogammaglobulinemia: Secondary | ICD-10-CM

## 2023-06-11 LAB — CMP (CANCER CENTER ONLY)
ALT: 16 U/L (ref 0–44)
AST: 12 U/L — ABNORMAL LOW (ref 15–41)
Albumin: 3.1 g/dL — ABNORMAL LOW (ref 3.5–5.0)
Alkaline Phosphatase: 96 U/L (ref 38–126)
Anion gap: 8 (ref 5–15)
BUN: 32 mg/dL — ABNORMAL HIGH (ref 8–23)
CO2: 29 mmol/L (ref 22–32)
Calcium: 11.4 mg/dL — ABNORMAL HIGH (ref 8.9–10.3)
Chloride: 101 mmol/L (ref 98–111)
Creatinine: 1.26 mg/dL — ABNORMAL HIGH (ref 0.61–1.24)
GFR, Estimated: 59 mL/min — ABNORMAL LOW (ref >60)
Glucose, Bld: 128 mg/dL — ABNORMAL HIGH (ref 70–99)
Potassium: 3.2 mmol/L — ABNORMAL LOW (ref 3.5–5.1)
Sodium: 138 mmol/L (ref 135–145)
Total Bilirubin: 0.4 mg/dL (ref 0.0–1.2)
Total Protein: 7.6 g/dL (ref 6.5–8.1)

## 2023-06-11 LAB — SAMPLE TO BLOOD BANK

## 2023-06-11 LAB — CBC WITH DIFFERENTIAL (CANCER CENTER ONLY)
Abs Immature Granulocytes: 0.04 10*3/uL (ref 0.00–0.07)
Basophils Absolute: 0.2 10*3/uL — ABNORMAL HIGH (ref 0.0–0.1)
Basophils Relative: 3 %
Eosinophils Absolute: 0.5 10*3/uL (ref 0.0–0.5)
Eosinophils Relative: 6 %
HCT: 25 % — ABNORMAL LOW (ref 39.0–52.0)
Hemoglobin: 8.1 g/dL — ABNORMAL LOW (ref 13.0–17.0)
Immature Granulocytes: 1 %
Lymphocytes Relative: 8 %
Lymphs Abs: 0.7 10*3/uL (ref 0.7–4.0)
MCH: 27.5 pg (ref 26.0–34.0)
MCHC: 32.4 g/dL (ref 30.0–36.0)
MCV: 84.7 fL (ref 80.0–100.0)
Monocytes Absolute: 0.5 10*3/uL (ref 0.1–1.0)
Monocytes Relative: 6 %
Neutro Abs: 6.5 10*3/uL (ref 1.7–7.7)
Neutrophils Relative %: 76 %
Platelet Count: 337 10*3/uL (ref 150–400)
RBC: 2.95 MIL/uL — ABNORMAL LOW (ref 4.22–5.81)
RDW: 15.8 % — ABNORMAL HIGH (ref 11.5–15.5)
WBC Count: 8.4 10*3/uL (ref 4.0–10.5)
nRBC: 0 % (ref 0.0–0.2)

## 2023-06-11 MED ORDER — DARATUMUMAB-HYALURONIDASE-FIHJ 1800-30000 MG-UT/15ML ~~LOC~~ SOLN
1800.0000 mg | Freq: Once | SUBCUTANEOUS | Status: AC
Start: 2023-06-11 — End: 2023-06-11
  Administered 2023-06-11: 1800 mg via SUBCUTANEOUS
  Filled 2023-06-11: qty 15

## 2023-06-11 MED ORDER — DEXAMETHASONE 4 MG PO TABS: 8.0000 mg | ORAL_TABLET | Freq: Every day | ORAL | Status: DC

## 2023-06-11 MED ORDER — ACETAMINOPHEN 325 MG PO TABS
650.0000 mg | ORAL_TABLET | Freq: Once | ORAL | Status: AC
  Administered 2023-06-11: 650 mg via ORAL
  Filled 2023-06-11: qty 2

## 2023-06-11 MED ORDER — DEXAMETHASONE 4 MG PO TABS: 8.0000 mg | ORAL_TABLET | Freq: Every day | ORAL | 1 refills | Status: DC

## 2023-06-11 MED ORDER — FUROSEMIDE 20 MG PO TABS: 40.0000 mg | ORAL_TABLET | Freq: Every day | ORAL | Status: DC

## 2023-06-11 MED ORDER — BORTEZOMIB CHEMO SQ INJECTION 3.5 MG (2.5MG/ML)
1.0400 mg/m2 | Freq: Once | INTRAMUSCULAR | Status: AC
Start: 2023-06-11 — End: 2023-06-11
  Administered 2023-06-11: 2.5 mg via SUBCUTANEOUS
  Filled 2023-06-11: qty 1

## 2023-06-11 MED ORDER — DIPHENHYDRAMINE HCL 25 MG PO CAPS
25.0000 mg | ORAL_CAPSULE | Freq: Once | ORAL | Status: AC
  Administered 2023-06-11: 25 mg via ORAL
  Filled 2023-06-11: qty 1

## 2023-06-11 NOTE — Telephone Encounter (Signed)
 Called and given appt for IVIG on 1/16 at 9 am at [facility]. Grandson verbalized understanding.

## 2023-06-11 NOTE — Patient Instructions (Signed)
 [facility] - A DEPT OF [facility]  Discharge Instructions: Thank you for choosing [facility] to provide your oncology and hematology care.   If you have a lab appointment with the Cancer Center, please go directly to the Cancer Center and check in at the registration area.   Wear comfortable clothing and clothing appropriate for easy access to any Portacath or PICC line.   We strive to give you quality time with your provider. You may need to reschedule your appointment if you arrive late (15 or more minutes).  Arriving late affects you and other patients whose appointments are after yours.  Also, if you miss three or more appointments without notifying the office, you may be dismissed from the clinic at the provider's discretion.      For prescription refill requests, have your pharmacy contact our office and allow 72 hours for refills to be completed.    Today you received the following chemotherapy and/or immunotherapy agents: Velcade/Darzalex Faspro      To help prevent nausea and vomiting after your treatment, we encourage you to take your nausea medication as directed.  BELOW ARE SYMPTOMS THAT SHOULD BE REPORTED IMMEDIATELY: *FEVER GREATER THAN 100.4 F (38 C) OR HIGHER *CHILLS OR SWEATING *NAUSEA AND VOMITING THAT IS NOT CONTROLLED WITH YOUR NAUSEA MEDICATION *UNUSUAL SHORTNESS OF BREATH *UNUSUAL BRUISING OR BLEEDING *URINARY PROBLEMS (pain or burning when urinating, or frequent urination) *BOWEL PROBLEMS (unusual diarrhea, constipation, pain near the anus) TENDERNESS IN MOUTH AND THROAT WITH OR WITHOUT PRESENCE OF ULCERS (sore throat, sores in mouth, or a toothache) UNUSUAL RASH, SWELLING OR PAIN  UNUSUAL VAGINAL DISCHARGE OR ITCHING   Items with * indicate a potential emergency and should be followed up as soon as possible or go to the Emergency Department if any problems should occur.  Please show the CHEMOTHERAPY ALERT CARD or  IMMUNOTHERAPY ALERT CARD at check-in to the Emergency Department and triage nurse.  Should you have questions after your visit or need to cancel or reschedule your appointment, please contact [facility] - A DEPT OF [facility]  Dept: [phone]  and follow the prompts.  Office hours are 8:00 a.m. to 4:30 p.m. Monday - Friday. Please note that voicemails left after 4:00 p.m. may not be returned until the following business day.  We are closed weekends and major holidays. You have access to a nurse at all times for urgent questions. Please call the main number to the clinic Dept: [phone] and follow the prompts.   For any non-urgent questions, you may also contact your provider using MyChart. We now offer e-Visits for anyone 25 and older to request care online for non-urgent symptoms. For details visit [email or web address].   Also download the MyChart app! Go to the app store, search "MyChart", open the app, select [place], and log in with your MyChart username and password.

## 2023-06-13 ENCOUNTER — Encounter: Payer: Self-pay | Admitting: Hematology and Oncology

## 2023-06-13 DIAGNOSIS — D801 Nonfamilial hypogammaglobulinemia: Secondary | ICD-10-CM | POA: Insufficient documentation

## 2023-06-13 LAB — MULTIPLE MYELOMA PANEL, SERUM
Albumin SerPl Elph-Mcnc: 2.8 g/dL — ABNORMAL LOW (ref 2.9–4.4)
Albumin/Glob SerPl: 0.7 (ref 0.7–1.7)
Alpha 1: 0.3 g/dL (ref 0.0–0.4)
Alpha2 Glob SerPl Elph-Mcnc: 0.9 g/dL (ref 0.4–1.0)
B-Globulin SerPl Elph-Mcnc: 0.8 g/dL (ref 0.7–1.3)
Gamma Glob SerPl Elph-Mcnc: 2.3 g/dL — ABNORMAL HIGH (ref 0.4–1.8)
Globulin, Total: 4.3 g/dL — ABNORMAL HIGH (ref 2.2–3.9)
IgA: 1939 mg/dL — ABNORMAL HIGH (ref 61–437)
IgG (Immunoglobin G), Serum: 331 mg/dL — ABNORMAL LOW (ref 603–1613)
IgM (Immunoglobulin M), Srm: 23 mg/dL (ref 15–143)
M Protein SerPl Elph-Mcnc: 1.9 g/dL — ABNORMAL HIGH
Total Protein ELP: 7.1 g/dL (ref 6.0–8.5)

## 2023-06-13 NOTE — Progress Notes (Signed)
 [facility] OFFICE PROGRESS NOTE  Patient Care Team: Daniel Kins, MD as PCP - General (Family Medicine) Daniel Agent, MD as PCP - Cardiology (Cardiology)  ASSESSMENT & PLAN:  Multiple myeloma without remission (HCC) His recent treatment was delayed due to recent infection Overall, he tolerated treatment well but he did not tolerate recent steroid taper He will continue chemotherapy as scheduled with additional treatment as outlined below He is not receiving Zometa  yet due to delay in dental clearance  Hypercalcemia He has recurrent hypercalcemia Currently, he is not symptomatic except for mild exacerbation of his rib pain We discussed the role of increasing dexamethasone , fluid hydration, furosemide  as well as continue on nasal calcitonin With recent findings from dental visit, I am not comfortable prescribing Zometa  yet but will consider this next week if needed The patient and family are educated to watch out for signs and symptoms of worsening hypercalcemia such as altered mental status and reduce urinary output  Cancer associated pain He has slight worsening rib pain due to recent coughing and hypercalcemia He will continue current prescribed oxycodone   Acquired hypogammaglobulinemia (HCC) Due to recent treatment and myeloma We discussed the role of IVIG treatment and he agreed to proceed Due to limited space, he will get his IVIG treatment at [facility]  No orders of the defined types were placed in this encounter.   All questions were answered. The patient knows to call the clinic with any problems, questions or concerns. The total time spent in the appointment was 40 minutes encounter with patients including review of chart and various tests results, discussions about plan of care and coordination of care plan   Daniel Bedford, MD 06/13/2023 1:23 PM  INTERVAL HISTORY: Please see below for problem oriented charting. he returns for chemotherapy  and follow-up He has not been feeling well He started coughing again over the past few days and with low-grade fever but nothing higher than 101 He denies recent constipation He has slight worsening rib pain, this time on the right side of his chest wall I reviewed recent test results and have very long discussion with his family The patient was seen by dentist recently.  He had changes to his gum as well as roots and would need dental extraction in the near future  Review of systems Eyes: Denies blurriness of vision Ears, nose, mouth, throat, and face: Denies mucositis or sore throat Cardiovascular: Denies palpitation, chest discomfort or lower extremity swelling Gastrointestinal:  Denies nausea, heartburn or change in bowel habits Skin: Denies abnormal skin rashes Lymphatics: Denies new lymphadenopathy or easy bruising Neurological:Denies numbness, tingling or new weaknesses Behavioral/Psych: Mood is stable, no new changes  All other systems were reviewed with the patient and are negative.  I have reviewed the past medical history, past surgical history, social history and family history with the patient and they are unchanged from previous note.  ALLERGIES:  is allergic to atorvastatin, cymbalta  [duloxetine  hcl], and penicillins.  MEDICATIONS:  Current Outpatient Medications  Medication Sig Dispense Refill   acyclovir  (ZOVIRAX ) 400 MG tablet Take 1 tablet (400 mg total) by mouth daily. 30 tablet 6   albuterol  (VENTOLIN  HFA) 108 (90 Base) MCG/ACT inhaler Inhale 1-2 puffs into the lungs every 6 (six) hours as needed for wheezing or shortness of breath.     allopurinol  (ZYLOPRIM ) 300 MG tablet Take 1 tablet (300 mg total) by mouth daily. 30 tablet 0   aspirin  EC 81 MG tablet Take 1 tablet (81 mg total)  by mouth daily. 100 tablet 3   brimonidine  (ALPHAGAN ) 0.2 % ophthalmic solution Place 1 drop into the left eye in the morning and at bedtime.     carvedilol  (COREG ) 3.125 MG tablet TAKE  1 TABLET BY MOUTH TWICE DAILY 180 tablet 3   cetirizine (ZYRTEC) 10 MG tablet Take 10 mg by mouth every morning.     Cholecalciferol  (VITAMIN D3) 50 MCG (2000 UT) TABS Take 2,000 Units by mouth daily.     cyclobenzaprine  (FLEXERIL ) 10 MG tablet Take 10 mg by mouth 3 (three) times daily as needed for muscle spasms.     dexamethasone  (DECADRON ) 4 MG tablet Take 2 tablets (8 mg total) by mouth daily. Take 5 pills once a week on Fridays. 60 tablet 1   docusate sodium  (COLACE) 100 MG capsule Take 100-200 mg by mouth 2 (two) times daily as needed for mild constipation.     dorzolamide -timolol  (COSOPT ) 22.3-6.8 MG/ML ophthalmic solution Place 1 drop into both eyes 2 (two) times daily.     furosemide  (LASIX ) 20 MG tablet Take 2 tablets (40 mg total) by mouth daily.     ketoconazole (NIZORAL) 2 % cream Apply 1 application  topically daily as needed for irritation.     latanoprost  (XALATAN ) 0.005 % ophthalmic solution Place 1 drop into both eyes at bedtime.     lenalidomide  (REVLIMID ) 10 MG capsule Take 1 capsule (10 mg total) by mouth daily. [employer] Auth # [id number]     Date Obtained 05/31/2023 Take 1 capsule daily for 14 days on and 7 days off repeat every 21 days 14 capsule 0   lidocaine  (XYLOCAINE ) 5 % ointment Apply 1 Application topically 2 (two) times daily as needed. (Patient taking differently: Apply 1 Application topically 2 (two) times daily as needed (for foot pain).) 35.44 g 1   magnesium  oxide (MAG-OX) 400 (240 Mg) MG tablet Take 400 mg by mouth daily.     Multiple Vitamins-Minerals (CENTRUM SILVER 50+MEN) TABS Take 1 tablet by mouth daily with breakfast.     nitroGLYCERIN  (NITROSTAT ) 0.4 MG SL tablet Place 1 tablet (0.4 mg total) under the tongue every 5 (five) minutes as needed. (Patient taking differently: Place 0.4 mg under the tongue every 5 (five) minutes as needed for chest pain.) 25 tablet 2   Omega-3 Fatty Acids (FISH OIL) 1200 MG CAPS Take 3,600 mg by mouth daily. 3     ondansetron   (ZOFRAN ) 8 MG tablet Take 1 tablet (8 mg total) by mouth every 8 (eight) hours as needed for nausea or vomiting. 30 tablet 1   Oxycodone  HCl 10 MG TABS Take 1 tablet (10 mg total) by mouth every 4 (four) hours as needed. 90 tablet 0   pantoprazole  (PROTONIX ) 40 MG tablet Take 40 mg by mouth daily before breakfast.     polyethylene glycol (MIRALAX  / GLYCOLAX ) 17 g packet Take 17 g by mouth daily.     pregabalin  (LYRICA ) 100 MG capsule TAKE 1 CAPSULE BY MOUTH IN THE MORNING, AND AT NOON, AND 2 CAPSULES AT NIGHT (Patient taking differently: Take 100 mg by mouth in the morning and at bedtime.) 120 capsule 5   prochlorperazine  (COMPAZINE ) 10 MG tablet Take 1 tablet (10 mg total) by mouth every 6 (six) hours as needed for nausea or vomiting. 30 tablet 1   rosuvastatin  (CRESTOR ) 40 MG tablet TAKE 1 TABLET BY MOUTH DAILY 90 tablet 0   senna-docusate (SENOKOT-S) 8.6-50 MG tablet Take 1 tablet by mouth 2 (two) times daily.  triamcinolone  (NASACORT ) 55 MCG/ACT AERO nasal inhaler Place 2 sprays into the nose daily. 1 each 12   TYLENOL  500 MG tablet Take 500-1,000 mg by mouth every 6 (six) hours as needed for mild pain (pain score 1-3) or headache.     No current facility-administered medications for this visit.    SUMMARY OF ONCOLOGIC HISTORY: Oncology History Overview Note  Myeloma FISH was neg Normal cytogenetics   Multiple myeloma without remission (HCC)  07/11/2013 Initial Diagnosis   Multiple myeloma without remission (HCC)   04/22/2023 PET scan   NM PET Image Initial (PI) Whole Body (F-18 FDG)  Result Date: 05/03/2023 CLINICAL DATA:  Subsequent treatment strategy for multiple myeloma. EXAM: NUCLEAR MEDICINE PET WHOLE BODY TECHNIQUE: 13 mCi F-18 FDG was injected intravenously. Full-ring PET imaging was performed from the head to foot after the radiotracer. CT data was obtained and used for attenuation correction and anatomic localization. Fasting blood glucose: 114 mg/dl COMPARISON:  None  Available. FINDINGS: Mediastinal blood pool activity: SUV max 2.0 HEAD/NECK: No hypermetabolic activity in the scalp. No hypermetabolic cervical lymph nodes. Incidental CT findings: Is CHEST: No hypermetabolic mediastinal or hilar nodes. No suspicious pulmonary nodules on the CT scan. Incidental CT findings: none ABDOMEN/PELVIS: No abnormal hypermetabolic activity within the liver, pancreas, adrenal glands, or spleen. No hypermetabolic lymph nodes in the abdomen or pelvis. Incidental CT findings: none SKELETON: There multiple foci focal radiotracer activity within the skeleton. Lesions are accompaniedlytic change on CT portion exam. There approximately 50 lesions. For example lesion the posterior RIGHT fifth rib with SUV max equal 3.6 on image 120. There is a lucent lesion and pathologic fracture associated with hypermetabolic activity the posterior LEFT sixth rib with SUV max equal 6.4 on image 129. Multiple lesions throughout the spine. Most intense lesion spinal lesion has associated soft tissue expansion at the the T2 vertebral body measuring 2.5 cm (107) with SUV max equal 7.3 Lesion in the inferior LEFT pubic rami subtle lucency on image 299. Suspicious lesions in LEFT and RIGHT femoral necks. Incidental CT findings: none EXTREMITIES: Hypermetabolic lesions RIGHT humeral shaft. Lesion the proximal LEFT and RIGHT femurs. Incidental CT findings: none IMPRESSION: 1. Multiple hypermetabolic lytic lesions throughout the skeleton consistent with multiple myeloma. Approximately 50 lesions within the axillary and appendicular skeleton. Many lesions accompanied lytic/lucent lesions on CT. 2. No evidence of soft tissue plasmacytoma. Electronically Signed   By: Jackquline Boxer M.D.   On: 05/03/2023 15:16   CT Chest W Contrast  Result Date: 05/03/2023 CLINICAL DATA:  Pneumonia with complication suspected. X-ray done. Cough with rib pain after coughing. Shortness of breath and right-sided pain. History of multiple  myeloma. EXAM: CT CHEST WITH CONTRAST TECHNIQUE: Multidetector CT imaging of the chest was performed during intravenous contrast administration. RADIATION DOSE REDUCTION: This exam was performed according to the departmental dose-optimization program which includes automated exposure control, adjustment of the mA and/or kV according to patient size and/or use of iterative reconstruction technique. CONTRAST:  60mL OMNIPAQUE  IOHEXOL  300 MG/ML  SOLN COMPARISON:  Chest radiograph 05/02/2023.  PET-CT 04/22/2023 FINDINGS: Cardiovascular: Mild cardiac enlargement. No pericardial effusions. Normal caliber thoracic aorta. Calcification of the aorta and coronary arteries. Mediastinum/Nodes: Thyroid  gland is unremarkable. Esophagus is decompressed. Scattered mediastinal lymph nodes are not pathologically enlarged. Lungs/Pleura: Motion artifact limits examination. There is evidence of atelectasis or consolidation in both lung bases. Small bilateral pleural effusions. Changes could represent pneumonia. No pneumothorax. Upper Abdomen: No acute abnormalities. Musculoskeletal: Degenerative changes in the spine and shoulders. Multiple  lucent lesions demonstrated in the ribs, spine, and shoulders consistent with history of myeloma. Fractures of the right lateral fifth, sixth, and seventh ribs as well as the right tenth rib. Fractures of the left third, fourth, and sixth ribs. No vertebral compression deformities are demonstrated although large lucent lesions are demonstrated in the T1 and T2 vertebrae. IMPRESSION: 1. Multiple lucent lesions demonstrated in the ribs, spine, and shoulders corresponding to the patient's history of multiple myeloma. 2. Multiple bilateral acute rib fractures. 3. Small bilateral pleural effusions with basilar atelectasis or consolidation, possibly pneumonia. Electronically Signed   By: Elsie Gravely M.D.   On: 05/03/2023 01:21   DG Chest Port 1 View  Result Date: 05/02/2023 CLINICAL DATA:  Shortness  of breath and cough. History of multiple myeloma. Right-sided pain. EXAM: PORTABLE CHEST 1 VIEW COMPARISON:  04/20/2023 FINDINGS: Shallow inspiration. Cardiac enlargement. No vascular congestion or edema. Linear atelectasis in the lung bases. No pleural effusions. No pneumothorax. Calcification of the aorta. A nondisplaced right lateral ninth rib fracture is suggested. IMPRESSION: Shallow inspiration with atelectasis in the lung bases. Cardiac enlargement. Probable right ninth rib fracture. Electronically Signed   By: Elsie Gravely M.D.   On: 05/02/2023 23:45   CT BONE MARROW BIOPSY & ASPIRATION  Result Date: 04/27/2023 INDICATION: myeloma staging EXAM: CT GUIDED BONE MARROW ASPIRATION AND CORE BIOPSY MEDICATIONS: None. ANESTHESIA/SEDATION: Moderate (conscious) sedation was employed during this procedure. A total of Versed  2 mg and Fentanyl  100 mcg was administered intravenously. Moderate Sedation Time: 13 minutes. The patient's level of consciousness and vital signs were monitored continuously by radiology nursing throughout the procedure under my direct supervision. FLUOROSCOPY TIME:  CT dose; 122 mGycm COMPLICATIONS: None immediate. Estimated blood loss: <5 mL PROCEDURE: RADIATION DOSE REDUCTION: This exam was performed according to the departmental dose-optimization program which includes automated exposure control, adjustment of the mA and/or kV according to patient size and/or use of iterative reconstruction technique. Informed written consent was obtained from the patient after a thorough discussion of the procedural risks, benefits and alternatives. All questions were addressed. Maximal Sterile Barrier Technique was utilized including caps, mask, sterile gowns, sterile gloves, sterile drape, hand hygiene and skin antiseptic. A timeout was performed prior to the initiation of the procedure. The patient was positioned prone and non-contrast localization CT was performed of the pelvis to demonstrate the  iliac marrow spaces. Maximal barrier sterile technique utilized including caps, mask, sterile gowns, sterile gloves, large sterile drape, hand hygiene, and chlorhexidine  prep. Under sterile conditions and local anesthesia, an 11 gauge coaxial bone biopsy needle was advanced into the RIGHT iliac marrow space. Needle position was confirmed with CT imaging. Initially, bone marrow aspiration was performed. Next, the 11 gauge outer cannula was utilized to obtain a 1 iliac bone marrow core biopsy. Needle was removed. Hemostasis was obtained with compression. The patient tolerated the procedure well. Samples were prepared with the cytotechnologist. IMPRESSION: Successful CT-guided bone marrow aspiration and biopsy. Thom Hall, MD [facility] Electronically Signed   By: Thom Hall M.D.   On: 04/27/2023 12:32   DG Chest 2 View  Result Date: 04/23/2023 CLINICAL DATA:  Cough for 2 months Pain between shoulder blades EXAM: CHEST - 2 VIEW COMPARISON:  08/24/2019 FINDINGS: Cardiomediastinal silhouette and pulmonary vasculature are within normal limits. Lungs are hyperexpanded but otherwise clear. Unchanged dextroconvex curvature of the thoracolumbar spine. IMPRESSION: 1. No acute cardiopulmonary process. 2. Hyperexpanded lungs, suspicious for emphysema. Electronically Signed   By: Farhaan  Mir M.D.   On: 04/23/2023 16:44      04/27/2023 Pathology Results   Surgical Pathology CASE: [id number] PATIENT: ELSIE CODDING Bone Marrow Report  Clinical History: Myeloma  DIAGNOSIS:  BONE MARROW, ASPIRATE, CLOT, CORE: -Hypercellular bone marrow with plasma cell neoplasm -See comment  PERIPHERAL BLOOD: -Normocytic-normochromic anemia -Leukocytosis with neutrophilic left shift  COMMENT:  The bone marrow is hypercellular for age with increased number of plasma cells averaging 15% of all cells in the aspirate.  The clot and biopsy sections show  interstitial infiltrates, numerous variably sized clusters in addition to an area of diffuse sheet of atypical plasma cells associated with kappa light chain restriction consistent with plasma cell neoplasm.  Correlation with cytogenetic and FISH studies is recommended.  MICROSCOPIC DESCRIPTION:  PERIPHERAL BLOOD SMEAR: The red blood cells display mild anisopoikilocytosis with mild to moderate polychromasia.  The white blood cells are slightly increased in number, mostly with neutrophils some of which display toxic granulation.  An occasional myelocyte is seen on scan.  The platelets are normal in number.  BONE MARROW ASPIRATE: Bone marrow particles present Erythroid precursors: Orderly and progressive maturation Granulocytic precursors: Orderly and progressive maturation for the most part Megakaryocytes: Abundant with scattered small and/or hypolobated forms or large cells. Lymphocytes/plasma cells: The plasma cells are increased in number representing 15% to all cells associated with atypical cytologic features characterized by cytomegaly and/or small nucleoli.  Large lymphoid aggregates are not seen.  TOUCH PREPARATIONS: A mixture of cell types present but with increased number of plasma cells  CLOT AND BIOPSY: The sections show variable cellularity ranging from 40 to 90% focally.  There are numerous variably sized clusters of atypical plasma cells in addition to a diffuse large sheet particularly in the area of most cellularity.  The background shows trilineage hematopoiesis.  Immunohistochemical stains for CD138 in addition to in situ hybridization for kappa and lambda were performed with appropriate controls.  CD138 highlights the increased plasma cell component consisting of interstitial infiltrates, numerous variably sized clusters, and an area of diffuse sheet particularly in the core biopsy. The plasma cells display kappa light chain restriction.  IRON STAIN: Iron stains are performed on a  bone marrow aspirate or touch imprint smear and section of clot. The controls stained appropriately.       Storage Iron: Decreased      Ring Sideroblasts: Absent  ADDITIONAL DATA/TESTING: The specimen was sent for cytogenetic analysis and FISH for multiple myeloma and a separate report will follow.    05/04/2023 Cancer Staging   Staging form: Plasma Cell Myeloma and Plasma Cell Disorders, AJCC 8th Edition - Clinical stage from 05/04/2023: RISS Stage II (Beta-2 -microglobulin (mg/L): 5.6, Albumin  (g/dL): 2.5, ISS: Stage III, High-risk cytogenetics: Absent, LDH: Normal) - Signed by Lonn Hicks, MD on 05/11/2023 Stage prefix: Initial diagnosis Beta 2 microglobulin range (mg/L): Greater than or equal to 5.5 Albumin  range (g/dL): Less than 3.5 Cytogenetics: No abnormalities   05/14/2023 -  Chemotherapy   Patient is on Treatment Plan : MYELOMA NEWLY DIAGNOSED TRANSPLANT CANDIDATE DaraVRd (Daratumumab  SQ) q21d x 6 Cycles (Induction/Consolidation)       PHYSICAL EXAMINATION: ECOG PERFORMANCE STATUS: 2 - Symptomatic, <50% confined to bed  Vitals:   06/11/23 1207  BP: (!) 133/56  Pulse: 77  Resp: 18  Temp: (!) 100.9 F (38.3 C)  SpO2: 95%   Filed Weights   06/11/23 1207  Weight: 233 lb (105.7 kg)    GENERAL:alert, no distress and comfortable SKIN: skin color, texture, turgor  are normal, no rashes or significant lesions EYES: normal, Conjunctiva are pink and non-injected, sclera clear OROPHARYNX:no exudate, no erythema and lips, buccal mucosa, and tongue normal  NECK: supple, thyroid  normal size, non-tender, without nodularity LYMPH:  no palpable lymphadenopathy in the cervical, axillary or inguinal LUNGS: clear to auscultation and percussion with normal breathing effort HEART: regular rate & rhythm and no murmurs and no lower extremity edema ABDOMEN:abdomen soft, non-tender and normal bowel sounds Musculoskeletal:no cyanosis of digits and no clubbing  NEURO: alert & oriented x 3  with fluent speech, no focal motor/sensory deficits  LABORATORY DATA:  I have reviewed the data as listed    Component Value Date/Time   NA 138 06/11/2023 1136   NA 139 01/12/2017 1011   K 3.2 (L) 06/11/2023 1136   K 4.3 01/12/2017 1011   CL 101 06/11/2023 1136   CO2 29 06/11/2023 1136   CO2 23 01/12/2017 1011   GLUCOSE 128 (H) 06/11/2023 1136   GLUCOSE 98 01/12/2017 1011   BUN 32 (H) 06/11/2023 1136   BUN 21.8 01/12/2017 1011   CREATININE 1.26 (H) 06/11/2023 1136   CREATININE 0.9 01/12/2017 1011   CALCIUM  11.4 (H) 06/11/2023 1136   CALCIUM  9.0 01/12/2017 1011   PROT 7.6 06/11/2023 1136   PROT 6.4 01/12/2017 1011   PROT 6.1 01/12/2017 1011   ALBUMIN  3.1 (L) 06/11/2023 1136   ALBUMIN  3.6 01/12/2017 1011   AST 12 (L) 06/11/2023 1136   AST 20 01/12/2017 1011   ALT 16 06/11/2023 1136   ALT 18 01/12/2017 1011   ALKPHOS 96 06/11/2023 1136   ALKPHOS 81 01/12/2017 1011   BILITOT 0.4 06/11/2023 1136   BILITOT 0.58 01/12/2017 1011   GFRNONAA 59 (L) 06/11/2023 1136   GFRAA >60 02/14/2020 1115    No results found for: SPEP, UPEP  Lab Results  Component Value Date   WBC 8.4 06/11/2023   NEUTROABS 6.5 06/11/2023   HGB 8.1 (L) 06/11/2023   HCT 25.0 (L) 06/11/2023   MCV 84.7 06/11/2023   PLT 337 06/11/2023      Chemistry      Component Value Date/Time   NA 138 06/11/2023 1136   NA 139 01/12/2017 1011   K 3.2 (L) 06/11/2023 1136   K 4.3 01/12/2017 1011   CL 101 06/11/2023 1136   CO2 29 06/11/2023 1136   CO2 23 01/12/2017 1011   BUN 32 (H) 06/11/2023 1136   BUN 21.8 01/12/2017 1011   CREATININE 1.26 (H) 06/11/2023 1136   CREATININE 0.9 01/12/2017 1011      Component Value Date/Time   CALCIUM  11.4 (H) 06/11/2023 1136   CALCIUM  9.0 01/12/2017 1011   ALKPHOS 96 06/11/2023 1136   ALKPHOS 81 01/12/2017 1011   AST 12 (L) 06/11/2023 1136   AST 20 01/12/2017 1011   ALT 16 06/11/2023 1136   ALT 18 01/12/2017 1011   BILITOT 0.4 06/11/2023 1136   BILITOT 0.58  01/12/2017 1011       RADIOGRAPHIC STUDIES: I have personally reviewed the radiological images as listed and agreed with the findings in the report. DG Chest 2 View Result Date: 05/25/2023 CLINICAL DATA:  Acute cough.  Shortness of breath. EXAM: CHEST - 2 VIEW COMPARISON:  Most recent radiograph 05/02/2023, CT 05/03/2023 FINDINGS: Vague opacity in the periphery of the left upper lobe is new from prior exam and may represent acute infection. Small left pleural effusion and basilar opacity is unchanged from prior exam. Previous right pleural effusion is not definitively  seen by radiograph. Known bilateral rib fractures, some of which demonstrate interval callus formation from earlier this month. Stable heart size and mediastinal contours. No pneumothorax. IMPRESSION: 1. Vague opacity in the periphery of the left upper lobe is new from prior exam and may represent acute infection. 2. Small left pleural effusion and basilar opacity is unchanged from prior exam. 3. Known bilateral rib fractures, some of which demonstrate interval callus formation from earlier this month. Electronically Signed   By: Andrea Gasman M.D.   On: 05/25/2023 07:15

## 2023-06-13 NOTE — Assessment & Plan Note (Addendum)
 His recent treatment was delayed due to recent infection Overall, he tolerated treatment well but he did not tolerate recent steroid taper He will continue chemotherapy as scheduled with additional treatment as outlined below He is not receiving Zometa  yet due to delay in dental clearance

## 2023-06-13 NOTE — Assessment & Plan Note (Signed)
 He has recurrent hypercalcemia Currently, he is not symptomatic except for mild exacerbation of his rib pain We discussed the role of increasing dexamethasone , fluid hydration, furosemide  as well as continue on nasal calcitonin With recent findings from dental visit, I am not comfortable prescribing Zometa  yet but will consider this next week if needed The patient and family are educated to watch out for signs and symptoms of worsening hypercalcemia such as altered mental status and reduce urinary output

## 2023-06-13 NOTE — Assessment & Plan Note (Signed)
 He has slight worsening rib pain due to recent coughing and hypercalcemia He will continue current prescribed oxycodone

## 2023-06-13 NOTE — Assessment & Plan Note (Signed)
 Due to recent treatment and myeloma We discussed the role of IVIG treatment and he agreed to proceed Due to limited space, he will get his IVIG treatment at [facility] location

## 2023-06-16 ENCOUNTER — Telehealth: Payer: Self-pay | Admitting: *Deleted

## 2023-06-16 ENCOUNTER — Other Ambulatory Visit: Payer: Self-pay | Admitting: *Deleted

## 2023-06-16 DIAGNOSIS — C9000 Multiple myeloma not having achieved remission: Secondary | ICD-10-CM

## 2023-06-16 MED ORDER — LENALIDOMIDE 10 MG PO CAPS: 10.0000 mg | ORAL_CAPSULE | Freq: Every day | ORAL | 0 refills | Status: DC

## 2023-06-16 NOTE — Telephone Encounter (Signed)
 Patient called - Reported pain in right calf - it hurts whenever he has been sitting a while and gets up and walks. After walking about 20 feet, the pain subsides. It started several days ago. Getting worse.  He denies swelling or redness, but states he can't really look closely at it due to position. He's scheduled for IVIG tomorrow at 9a - at [facility].   Contacted Dr. Maryalice Smaller (on call MD) in Dr. Camilla Cedar absence with all information from patient .  Per Dr. Maryalice Smaller: She recommended patient have US  at [facility] -- prior to IVIG if possible. She also said to advise him to go to ED tonight if:  pain changes, gets worse, or he gets short of breath or has chest pain. Informed him to go to [facility] for 9 am infusion appt  unless office calls him in AM with different plans. He said he will be there.Advised him he may have US  tomorrow while at [facility].   He was informed of all information and said he understood the instructions regarding symptoms and when to go to ED.

## 2023-06-16 NOTE — Telephone Encounter (Signed)
 Refill requested of Revlimid  10 mg, 14 days on and 7 days off, repeat every 21 days. Refilled per treatment plan noted in MD OV note 06/11/23

## 2023-06-17 ENCOUNTER — Other Ambulatory Visit: Payer: Self-pay | Admitting: Hematology and Oncology

## 2023-06-17 ENCOUNTER — Inpatient Hospital Stay: Payer: Medicare Other

## 2023-06-17 ENCOUNTER — Telehealth: Payer: Self-pay

## 2023-06-17 DIAGNOSIS — M79604 Pain in right leg: Secondary | ICD-10-CM | POA: Diagnosis not present

## 2023-06-17 DIAGNOSIS — C9000 Multiple myeloma not having achieved remission: Secondary | ICD-10-CM | POA: Diagnosis not present

## 2023-06-17 DIAGNOSIS — G893 Neoplasm related pain (acute) (chronic): Secondary | ICD-10-CM | POA: Diagnosis not present

## 2023-06-17 DIAGNOSIS — R0601 Orthopnea: Secondary | ICD-10-CM | POA: Diagnosis not present

## 2023-06-17 DIAGNOSIS — D539 Nutritional anemia, unspecified: Secondary | ICD-10-CM | POA: Diagnosis not present

## 2023-06-17 DIAGNOSIS — Z7961 Long term (current) use of immunomodulator: Secondary | ICD-10-CM | POA: Diagnosis not present

## 2023-06-17 DIAGNOSIS — Z5112 Encounter for antineoplastic immunotherapy: Secondary | ICD-10-CM | POA: Diagnosis not present

## 2023-06-17 DIAGNOSIS — Z79899 Other long term (current) drug therapy: Secondary | ICD-10-CM | POA: Diagnosis not present

## 2023-06-17 DIAGNOSIS — D801 Nonfamilial hypogammaglobulinemia: Secondary | ICD-10-CM | POA: Diagnosis not present

## 2023-06-17 MED ORDER — DIPHENHYDRAMINE HCL 25 MG PO CAPS
25.0000 mg | ORAL_CAPSULE | Freq: Once | ORAL | Status: AC
  Administered 2023-06-17: 25 mg via ORAL
  Filled 2023-06-17: qty 1

## 2023-06-17 MED ORDER — DEXTROSE 5 % IV SOLN: INTRAVENOUS | Status: DC

## 2023-06-17 MED ORDER — IMMUNE GLOBULIN (HUMAN) 10 GM/100ML IV SOLN
400.0000 mg/kg | Freq: Once | INTRAVENOUS | Status: AC
  Administered 2023-06-17: 40 g via INTRAVENOUS
  Filled 2023-06-17: qty 400

## 2023-06-17 MED ORDER — ACETAMINOPHEN 325 MG PO TABS
650.0000 mg | ORAL_TABLET | Freq: Once | ORAL | Status: AC
Start: 2023-06-17 — End: 2023-06-17
  Administered 2023-06-17: 650 mg via ORAL
  Filled 2023-06-17: qty 2

## 2023-06-17 NOTE — Progress Notes (Signed)
Received message from Dr. Maxine Glenn nurse stating information below:   "Patient of Dr. Bertis Ruddy - Mr. Bruss is scheduled at 9 am for IVIG at [facility] on 1/16. This afternoon he called w/report pain Rt calf - hurts whenever gets up and walks after sitting. Walks about 20-30 feet and pain subsides. Began several days ago. Getting worse.Denies swelling or redness, but states he can't get down that far to look at it. Contacted Dr. Mosetta Putt (on call). She recommended patient go to ED if CB, SOB or pain worse overnight. If ok overnight, he should get U/S at [facility] 1/16 - before infusion if possible. Patient instructed when to go to ED, if needed."  Follow-up message was sent to Dr. Ila Mcgill nurse and [place] scheduled for 06/18/23 (see progress note entered by Jarome Lamas, RN).  Patient was assessed upon arrival to [facility] infusion room.  VSS, patient reported R lower leg pain constant, aching, burning 3/10 when sitting/resting, 10/10 when walking. Patient reported SOB with ambulation but denied SOB when seated/resting. Skin assess on R leg performed, no discoloration but minimal swelling visualized. Patient stated leg pain has been present for 3 days.  Dr. [name] made aware of patient's assessment.  Per Dr. [name], ok to proceed with IVIG treatment today, patient to take a total of 325mg  Aspirin today. Patient and wife made aware of Dr. Maxine Glenn response and both verbalized understanding.    Patient monitored for 30 minutes post infusion. VSS upon leaving infusion room.

## 2023-06-17 NOTE — Telephone Encounter (Signed)
Patient aware of updates and additions to his schedule. Patient scheduled for labs at 10:15 tomorrow followed by doppler of R calf at 11:00 at [facility] hospital. Patient aware and agreeable to the changes. Following Doppler, patient will be seen by Dr. Bertis Ruddy at 12:20.  Patient verbalized an understanding and agreeable to the plan.

## 2023-06-17 NOTE — Patient Instructions (Signed)

## 2023-06-18 ENCOUNTER — Inpatient Hospital Stay: Payer: Medicare Other | Admitting: Hematology and Oncology

## 2023-06-18 ENCOUNTER — Encounter: Payer: Self-pay | Admitting: Hematology and Oncology

## 2023-06-18 ENCOUNTER — Inpatient Hospital Stay: Payer: Medicare Other

## 2023-06-18 ENCOUNTER — Ambulatory Visit (HOSPITAL_BASED_OUTPATIENT_CLINIC_OR_DEPARTMENT_OTHER)
Admission: RE | Admit: 2023-06-18 | Discharge: 2023-06-18 | Disposition: A | Discharge status: Home / Self Care | Payer: Medicare Other | Source: Ambulatory Visit | Attending: Hematology and Oncology | Admitting: Hematology and Oncology

## 2023-06-18 VITALS — BP 123/51 | HR 69 | Resp 18 | Ht 73.0 in | Wt 236.4 lb

## 2023-06-18 DIAGNOSIS — D801 Nonfamilial hypogammaglobulinemia: Secondary | ICD-10-CM

## 2023-06-18 DIAGNOSIS — M79604 Pain in right leg: Secondary | ICD-10-CM | POA: Insufficient documentation

## 2023-06-18 DIAGNOSIS — D638 Anemia in other chronic diseases classified elsewhere: Secondary | ICD-10-CM

## 2023-06-18 DIAGNOSIS — C9000 Multiple myeloma not having achieved remission: Secondary | ICD-10-CM

## 2023-06-18 DIAGNOSIS — D539 Nutritional anemia, unspecified: Secondary | ICD-10-CM | POA: Diagnosis not present

## 2023-06-18 DIAGNOSIS — Z5112 Encounter for antineoplastic immunotherapy: Secondary | ICD-10-CM | POA: Diagnosis not present

## 2023-06-18 DIAGNOSIS — G893 Neoplasm related pain (acute) (chronic): Secondary | ICD-10-CM

## 2023-06-18 DIAGNOSIS — R0601 Orthopnea: Secondary | ICD-10-CM | POA: Diagnosis not present

## 2023-06-18 DIAGNOSIS — Z7961 Long term (current) use of immunomodulator: Secondary | ICD-10-CM | POA: Diagnosis not present

## 2023-06-18 DIAGNOSIS — Z79899 Other long term (current) drug therapy: Secondary | ICD-10-CM | POA: Diagnosis not present

## 2023-06-18 LAB — CBC WITH DIFFERENTIAL (CANCER CENTER ONLY)
Abs Immature Granulocytes: 0.05 10*3/uL (ref 0.00–0.07)
Basophils Absolute: 0.1 10*3/uL (ref 0.0–0.1)
Basophils Relative: 1 %
Eosinophils Absolute: 0.7 10*3/uL — ABNORMAL HIGH (ref 0.0–0.5)
Eosinophils Relative: 7 %
HCT: 25.1 % — ABNORMAL LOW (ref 39.0–52.0)
Hemoglobin: 8.3 g/dL — ABNORMAL LOW (ref 13.0–17.0)
Immature Granulocytes: 1 %
Lymphocytes Relative: 7 %
Lymphs Abs: 0.6 10*3/uL — ABNORMAL LOW (ref 0.7–4.0)
MCH: 28.1 pg (ref 26.0–34.0)
MCHC: 33.1 g/dL (ref 30.0–36.0)
MCV: 85.1 fL (ref 80.0–100.0)
Monocytes Absolute: 0.9 10*3/uL (ref 0.1–1.0)
Monocytes Relative: 10 %
Neutro Abs: 6.9 10*3/uL (ref 1.7–7.7)
Neutrophils Relative %: 74 %
Platelet Count: 275 10*3/uL (ref 150–400)
RBC: 2.95 MIL/uL — ABNORMAL LOW (ref 4.22–5.81)
RDW: 17 % — ABNORMAL HIGH (ref 11.5–15.5)
WBC Count: 9.2 10*3/uL (ref 4.0–10.5)
nRBC: 0 % (ref 0.0–0.2)

## 2023-06-18 LAB — CMP (CANCER CENTER ONLY)
ALT: 16 U/L (ref 0–44)
AST: 12 U/L — ABNORMAL LOW (ref 15–41)
Albumin: 3.3 g/dL — ABNORMAL LOW (ref 3.5–5.0)
Alkaline Phosphatase: 117 U/L (ref 38–126)
Anion gap: 6 (ref 5–15)
BUN: 29 mg/dL — ABNORMAL HIGH (ref 8–23)
CO2: 28 mmol/L (ref 22–32)
Calcium: 9.5 mg/dL (ref 8.9–10.3)
Chloride: 102 mmol/L (ref 98–111)
Creatinine: 0.9 mg/dL (ref 0.61–1.24)
GFR, Estimated: >60 mL/min (ref >60)
Glucose, Bld: 122 mg/dL — ABNORMAL HIGH (ref 70–99)
Potassium: 3.8 mmol/L (ref 3.5–5.1)
Sodium: 136 mmol/L (ref 135–145)
Total Bilirubin: 0.3 mg/dL (ref 0.0–1.2)
Total Protein: 7.3 g/dL (ref 6.5–8.1)

## 2023-06-18 LAB — SAMPLE TO BLOOD BANK

## 2023-06-18 MED ORDER — BORTEZOMIB CHEMO SQ INJECTION 3.5 MG (2.5MG/ML)
1.0400 mg/m2 | Freq: Once | INTRAMUSCULAR | Status: AC
Start: 2023-06-18 — End: 2023-06-18
  Administered 2023-06-18: 2.5 mg via SUBCUTANEOUS
  Filled 2023-06-18: qty 1

## 2023-06-18 MED ORDER — DARATUMUMAB-HYALURONIDASE-FIHJ 1800-30000 MG-UT/15ML ~~LOC~~ SOLN
1800.0000 mg | Freq: Once | SUBCUTANEOUS | Status: AC
Start: 2023-06-18 — End: 2023-06-18
  Administered 2023-06-18: 1800 mg via SUBCUTANEOUS
  Filled 2023-06-18: qty 15

## 2023-06-18 MED ORDER — DEXAMETHASONE 4 MG PO TABS: ORAL_TABLET | ORAL | 1 refills | Status: DC

## 2023-06-18 MED ORDER — DIPHENHYDRAMINE HCL 25 MG PO CAPS
25.0000 mg | ORAL_CAPSULE | Freq: Once | ORAL | Status: AC
Start: 2023-06-18 — End: 2023-06-18
  Administered 2023-06-18: 25 mg via ORAL
  Filled 2023-06-18: qty 1

## 2023-06-18 MED ORDER — ASPIRIN 325 MG PO TBEC: 325.0000 mg | DELAYED_RELEASE_TABLET | Freq: Every day | ORAL | Status: DC

## 2023-06-18 MED ORDER — ACETAMINOPHEN 325 MG PO TABS
650.0000 mg | ORAL_TABLET | Freq: Once | ORAL | Status: AC
Start: 2023-06-18 — End: 2023-06-18
  Administered 2023-06-18: 650 mg via ORAL
  Filled 2023-06-18: qty 2

## 2023-06-18 MED ORDER — DEXAMETHASONE 4 MG PO TABS: 4.0000 mg | ORAL_TABLET | Freq: Every day | ORAL | Status: DC

## 2023-06-18 NOTE — Assessment & Plan Note (Signed)
He tolerated IVIG well He will continue IVIG infusion monthly

## 2023-06-18 NOTE — Patient Instructions (Signed)
 [facility] - A DEPT OF [facility]  Discharge Instructions: Thank you for choosing [facility] to provide your oncology and hematology care.   If you have a lab appointment with the Cancer Center, please go directly to the Cancer Center and check in at the registration area.   Wear comfortable clothing and clothing appropriate for easy access to any Portacath or PICC line.   We strive to give you quality time with your provider. You may need to reschedule your appointment if you arrive late (15 or more minutes).  Arriving late affects you and other patients whose appointments are after yours.  Also, if you miss three or more appointments without notifying the office, you may be dismissed from the clinic at the provider's discretion.      For prescription refill requests, have your pharmacy contact our office and allow 72 hours for refills to be completed.    Today you received the following chemotherapy and/or immunotherapy agents: Velcade/Darzalex Faspro      To help prevent nausea and vomiting after your treatment, we encourage you to take your nausea medication as directed.  BELOW ARE SYMPTOMS THAT SHOULD BE REPORTED IMMEDIATELY: *FEVER GREATER THAN 100.4 F (38 C) OR HIGHER *CHILLS OR SWEATING *NAUSEA AND VOMITING THAT IS NOT CONTROLLED WITH YOUR NAUSEA MEDICATION *UNUSUAL SHORTNESS OF BREATH *UNUSUAL BRUISING OR BLEEDING *URINARY PROBLEMS (pain or burning when urinating, or frequent urination) *BOWEL PROBLEMS (unusual diarrhea, constipation, pain near the anus) TENDERNESS IN MOUTH AND THROAT WITH OR WITHOUT PRESENCE OF ULCERS (sore throat, sores in mouth, or a toothache) UNUSUAL RASH, SWELLING OR PAIN  UNUSUAL VAGINAL DISCHARGE OR ITCHING   Items with * indicate a potential emergency and should be followed up as soon as possible or go to the Emergency Department if any problems should occur.  Please show the CHEMOTHERAPY ALERT CARD or  IMMUNOTHERAPY ALERT CARD at check-in to the Emergency Department and triage nurse.  Should you have questions after your visit or need to cancel or reschedule your appointment, please contact [facility] - A DEPT OF [facility]  Dept: [phone]  and follow the prompts.  Office hours are 8:00 a.m. to 4:30 p.m. Monday - Friday. Please note that voicemails left after 4:00 p.m. may not be returned until the following business day.  We are closed weekends and major holidays. You have access to a nurse at all times for urgent questions. Please call the main number to the clinic Dept: [phone] and follow the prompts.   For any non-urgent questions, you may also contact your provider using MyChart. We now offer e-Visits for anyone 25 and older to request care online for non-urgent symptoms. For details visit [email or web address].   Also download the MyChart app! Go to the app store, search "MyChart", open the app, select [place], and log in with your MyChart username and password.

## 2023-06-18 NOTE — Assessment & Plan Note (Signed)
Venous Doppler ultrasound is negative for DVT His physical examination is most consistent with possible muscular strain I recommend conservative approach with over-the-counter remedies such as heat pad, topical lidocaine and he has pain medicine to take as needed

## 2023-06-18 NOTE — Progress Notes (Signed)
[facility] OFFICE PROGRESS NOTE  Patient Care Team: [name], MD as PCP - General (Family Medicine) [name], MD as PCP - Cardiology (Cardiology)  ASSESSMENT & PLAN:  Multiple myeloma without remission (HCC) I reviewed recent myeloma panel with the patient and family He has excellent partial response to treatment after only 2 doses of chemotherapy We will proceed with treatment as scheduled and continue myeloma monitoring once a month I recommend we delay dental clearance for Zometa due to high-dose dexamethasone with concern for wound healing and persistent anemia He will continue with vitamin D supplement and acyclovir for antimicrobial prophylaxis He will continue on aspirin for DVT prophylaxis and I recommend higher dose of aspirin for now  Anemia, chronic disease This is likely anemia of chronic disease and from recent renal failure and chemotherapy. The patient denies recent history of bleeding such as epistaxis, hematuria or hematochezia. He is asymptomatic from the anemia. We will observe for now.   Acquired hypogammaglobulinemia (HCC) He tolerated IVIG well He will continue IVIG infusion monthly  Hypercalcemia This has resolved.  I gave the patient verbal and written instruction to reduce dexamethasone and to discontinue nasal calcitonin We would defer Zometa until he is able to get dental clearance  Cancer associated pain His rib pain has improved He will continue to take oxycodone as needed  Right leg pain Venous Doppler ultrasound is negative for DVT His physical examination is most consistent with possible muscular strain I recommend conservative approach with over-the-counter remedies such as heat pad, topical lidocaine and he has pain medicine to take as needed  No orders of the defined types were placed in this encounter.   All questions were answered. The patient knows to call the clinic with any problems, questions or  concerns. The total time spent in the appointment was 40 minutes encounter with patients including review of chart and various tests results, discussions about plan of care and coordination of care plan   [name], MD 06/18/2023 12:43 PM  INTERVAL HISTORY: Please see below for problem oriented charting. he returns for chemo follow-up with his family His grandson is available on the phone to collaborate his history He completed IVIG without difficulties yesterday He still have pain on the right lower extremity in the right calf area Venous Doppler ultrasound is negative for DVT His pain in the ribs has improved.  He is taking oxycodone for the leg pain.  His cough has resolved.  No further fevers We discussed test results and future follow-up  REVIEW OF SYSTEMS:   Constitutional: Denies fevers, chills or abnormal weight loss Eyes: Denies blurriness of vision Ears, nose, mouth, throat, and face: Denies mucositis or sore throat Respiratory: Denies cough, dyspnea or wheezes Cardiovascular: Denies palpitation, chest discomfort or lower extremity swelling Gastrointestinal:  Denies nausea, heartburn or change in bowel habits Skin: Denies abnormal skin rashes Lymphatics: Denies new lymphadenopathy or easy bruising Neurological:Denies numbness, tingling or new weaknesses Behavioral/Psych: Mood is stable, no new changes  All other systems were reviewed with the patient and are negative.  I have reviewed the past medical history, past surgical history, social history and family history with the patient and they are unchanged from previous note.  ALLERGIES:  is allergic to atorvastatin, cymbalta [duloxetine hcl], and penicillins.  MEDICATIONS:  Current Outpatient Medications  Medication Sig Dispense Refill   acyclovir (ZOVIRAX) 400 MG tablet Take 1 tablet (400 mg total) by mouth daily. 30 tablet 6   albuterol (VENTOLIN HFA) 108 (  90 Base) MCG/ACT inhaler Inhale 1-2 puffs into the lungs every  6 (six) hours as needed for wheezing or shortness of breath.     allopurinol (ZYLOPRIM) 300 MG tablet Take 1 tablet (300 mg total) by mouth daily. 30 tablet 0   aspirin EC 325 MG tablet Take 1 tablet (325 mg total) by mouth daily.     brimonidine (ALPHAGAN) 0.2 % ophthalmic solution Place 1 drop into the left eye in the morning and at bedtime.     carvedilol (COREG) 3.125 MG tablet TAKE 1 TABLET BY MOUTH TWICE DAILY 180 tablet 3   cetirizine (ZYRTEC) 10 MG tablet Take 10 mg by mouth every morning.     Cholecalciferol (VITAMIN D3) 50 MCG (2000 UT) TABS Take 2,000 Units by mouth daily.     cyclobenzaprine (FLEXERIL) 10 MG tablet Take 10 mg by mouth 3 (three) times daily as needed for muscle spasms.     dexamethasone (DECADRON) 4 MG tablet Take 1 pill daily except take 5 pills once a week on Fridays. 60 tablet 1   docusate sodium (COLACE) 100 MG capsule Take 100-200 mg by mouth 2 (two) times daily as needed for mild constipation.     dorzolamide-timolol (COSOPT) 22.3-6.8 MG/ML ophthalmic solution Place 1 drop into both eyes 2 (two) times daily.     furosemide (LASIX) 20 MG tablet Take 2 tablets (40 mg total) by mouth daily.     ketoconazole (NIZORAL) 2 % cream Apply 1 application  topically daily as needed for irritation.     latanoprost (XALATAN) 0.005 % ophthalmic solution Place 1 drop into both eyes at bedtime.     lenalidomide (REVLIMID) 10 MG capsule Take 1 capsule (10 mg total) by mouth daily. Take 1 capsule daily for 14 days on and 7 days off, repeat every 21 days. [employer] Auth # [id number]   Date Obtained 06/16/2023 14 capsule 0   lidocaine (XYLOCAINE) 5 % ointment Apply 1 Application topically 2 (two) times daily as needed. (Patient taking differently: Apply 1 Application topically 2 (two) times daily as needed (for foot pain).) 35.44 g 1   magnesium oxide (MAG-OX) 400 (240 Mg) MG tablet Take 400 mg by mouth daily.     Multiple Vitamins-Minerals (CENTRUM SILVER 50+MEN) TABS Take 1 tablet by mouth  daily with breakfast.     nitroGLYCERIN (NITROSTAT) 0.4 MG SL tablet Place 1 tablet (0.4 mg total) under the tongue every 5 (five) minutes as needed. (Patient taking differently: Place 0.4 mg under the tongue every 5 (five) minutes as needed for chest pain.) 25 tablet 2   Omega-3 Fatty Acids (FISH OIL) 1200 MG CAPS Take 3,600 mg by mouth daily. 3     ondansetron (ZOFRAN) 8 MG tablet Take 1 tablet (8 mg total) by mouth every 8 (eight) hours as needed for nausea or vomiting. 30 tablet 1   Oxycodone HCl 10 MG TABS Take 1 tablet (10 mg total) by mouth every 4 (four) hours as needed. 90 tablet 0   pantoprazole (PROTONIX) 40 MG tablet Take 40 mg by mouth daily before breakfast.     polyethylene glycol (MIRALAX / GLYCOLAX) 17 g packet Take 17 g by mouth daily.     pregabalin (LYRICA) 100 MG capsule TAKE 1 CAPSULE BY MOUTH IN THE MORNING, AND AT NOON, AND 2 CAPSULES AT NIGHT (Patient taking differently: Take 100 mg by mouth in the morning and at bedtime.) 120 capsule 5   prochlorperazine (COMPAZINE) 10 MG tablet Take 1 tablet (10 mg  total) by mouth every 6 (six) hours as needed for nausea or vomiting. 30 tablet 1   rosuvastatin (CRESTOR) 40 MG tablet TAKE 1 TABLET BY MOUTH DAILY 90 tablet 0   senna-docusate (SENOKOT-S) 8.6-50 MG tablet Take 1 tablet by mouth 2 (two) times daily.     triamcinolone (NASACORT) 55 MCG/ACT AERO nasal inhaler Place 2 sprays into the nose daily. 1 each 12   TYLENOL 500 MG tablet Take 500-1,000 mg by mouth every 6 (six) hours as needed for mild pain (pain score 1-3) or headache.     No current facility-administered medications for this visit.   Facility-Administered Medications Ordered in Other Visits  Medication Dose Route Frequency Provider Last Rate Last Admin   bortezomib SQ (VELCADE) chemo injection (2.5mg /mL concentration) 2.5 mg  1.04 mg/m2 (Treatment Plan Recorded) Subcutaneous Once Bertis Ruddy, Epic Tribbett, MD       daratumumab-hyaluronidase-fihj (DARZALEX FASPRO) 1800-30000  MG-UT/15ML chemo SQ injection 1,800 mg  1,800 mg Subcutaneous Once [name], MD        SUMMARY OF ONCOLOGIC HISTORY: Oncology History Overview Note  Myeloma FISH was neg Normal cytogenetics   Multiple myeloma without remission (HCC)  07/11/2013 Initial Diagnosis   Multiple myeloma without remission (HCC)   04/22/2023 PET scan   NM PET Image Initial (PI) Whole Body (F-18 FDG)  Result Date: 05/03/2023 CLINICAL DATA:  Subsequent treatment strategy for multiple myeloma. EXAM: NUCLEAR MEDICINE PET WHOLE BODY TECHNIQUE: 13 mCi F-18 FDG was injected intravenously. Full-ring PET imaging was performed from the head to foot after the radiotracer. CT data was obtained and used for attenuation correction and anatomic localization. Fasting blood glucose: 114 mg/dl COMPARISON:  None Available. FINDINGS: Mediastinal blood pool activity: SUV max 2.0 HEAD/NECK: No hypermetabolic activity in the scalp. No hypermetabolic cervical lymph nodes. Incidental CT findings: Is CHEST: No hypermetabolic mediastinal or hilar nodes. No suspicious pulmonary nodules on the CT scan. Incidental CT findings: none ABDOMEN/PELVIS: No abnormal hypermetabolic activity within the liver, pancreas, adrenal glands, or spleen. No hypermetabolic lymph nodes in the abdomen or pelvis. Incidental CT findings: none SKELETON: There multiple foci focal radiotracer activity within the skeleton. Lesions are accompaniedlytic change on CT portion exam. There approximately 50 lesions. For example lesion the posterior RIGHT fifth rib with SUV max equal 3.6 on image 120. There is a lucent lesion and pathologic fracture associated with hypermetabolic activity the posterior LEFT sixth rib with SUV max equal 6.4 on image 129. Multiple lesions throughout the spine. Most intense lesion spinal lesion has associated soft tissue expansion at the the T2 vertebral body measuring 2.5 cm (107) with SUV max equal 7.3 Lesion in the inferior LEFT pubic rami subtle lucency  on image 299. Suspicious lesions in LEFT and RIGHT femoral necks. Incidental CT findings: none EXTREMITIES: Hypermetabolic lesions RIGHT humeral shaft. Lesion the proximal LEFT and RIGHT femurs. Incidental CT findings: none IMPRESSION: 1. Multiple hypermetabolic lytic lesions throughout the skeleton consistent with multiple myeloma. Approximately 50 lesions within the axillary and appendicular skeleton. Many lesions accompanied lytic/lucent lesions on CT. 2. No evidence of soft tissue plasmacytoma. Electronically Signed   By: Genevive Bi M.D.   On: 05/03/2023 15:16   CT Chest W Contrast  Result Date: 05/03/2023 CLINICAL DATA:  Pneumonia with complication suspected. X-ray done. Cough with rib pain after coughing. Shortness of breath and right-sided pain. History of multiple myeloma. EXAM: CT CHEST WITH CONTRAST TECHNIQUE: Multidetector CT imaging of the chest was performed during intravenous contrast administration. RADIATION DOSE REDUCTION: This exam was  performed according to the departmental dose-optimization program which includes automated exposure control, adjustment of the mA and/or kV according to patient size and/or use of iterative reconstruction technique. CONTRAST:  60mL OMNIPAQUE IOHEXOL 300 MG/ML  SOLN COMPARISON:  Chest radiograph 05/02/2023.  PET-CT 04/22/2023 FINDINGS: Cardiovascular: Mild cardiac enlargement. No pericardial effusions. Normal caliber thoracic aorta. Calcification of the aorta and coronary arteries. Mediastinum/Nodes: Thyroid gland is unremarkable. Esophagus is decompressed. Scattered mediastinal lymph nodes are not pathologically enlarged. Lungs/Pleura: Motion artifact limits examination. There is evidence of atelectasis or consolidation in both lung bases. Small bilateral pleural effusions. Changes could represent pneumonia. No pneumothorax. Upper Abdomen: No acute abnormalities. Musculoskeletal: Degenerative changes in the spine and shoulders. Multiple lucent lesions  demonstrated in the ribs, spine, and shoulders consistent with history of myeloma. Fractures of the right lateral fifth, sixth, and seventh ribs as well as the right tenth rib. Fractures of the left third, fourth, and sixth ribs. No vertebral compression deformities are demonstrated although large lucent lesions are demonstrated in the T1 and T2 vertebrae. IMPRESSION: 1. Multiple lucent lesions demonstrated in the ribs, spine, and shoulders corresponding to the patient's history of multiple myeloma. 2. Multiple bilateral acute rib fractures. 3. Small bilateral pleural effusions with basilar atelectasis or consolidation, possibly pneumonia. Electronically Signed   By: Burman Nieves M.D.   On: 05/03/2023 01:21   DG Chest Port 1 View  Result Date: 05/02/2023 CLINICAL DATA:  Shortness of breath and cough. History of multiple myeloma. Right-sided pain. EXAM: PORTABLE CHEST 1 VIEW COMPARISON:  04/20/2023 FINDINGS: Shallow inspiration. Cardiac enlargement. No vascular congestion or edema. Linear atelectasis in the lung bases. No pleural effusions. No pneumothorax. Calcification of the aorta. A nondisplaced right lateral ninth rib fracture is suggested. IMPRESSION: Shallow inspiration with atelectasis in the lung bases. Cardiac enlargement. Probable right ninth rib fracture. Electronically Signed   By: Burman Nieves M.D.   On: 05/02/2023 23:45   CT BONE MARROW BIOPSY & ASPIRATION  Result Date: 04/27/2023 INDICATION: myeloma staging EXAM: CT GUIDED BONE MARROW ASPIRATION AND CORE BIOPSY MEDICATIONS: None. ANESTHESIA/SEDATION: Moderate (conscious) sedation was employed during this procedure. A total of Versed 2 mg and Fentanyl 100 mcg was administered intravenously. Moderate Sedation Time: 13 minutes. The patient's level of consciousness and vital signs were monitored continuously by radiology nursing throughout the procedure under my direct supervision. FLUOROSCOPY TIME:  CT dose; 122 mGycm COMPLICATIONS: None  immediate. Estimated blood loss: <5 mL PROCEDURE: RADIATION DOSE REDUCTION: This exam was performed according to the departmental dose-optimization program which includes automated exposure control, adjustment of the mA and/or kV according to patient size and/or use of iterative reconstruction technique. Informed written consent was obtained from the patient after a thorough discussion of the procedural risks, benefits and alternatives. All questions were addressed. Maximal Sterile Barrier Technique was utilized including caps, mask, sterile gowns, sterile gloves, sterile drape, hand hygiene and skin antiseptic. A timeout was performed prior to the initiation of the procedure. The patient was positioned prone and non-contrast localization CT was performed of the pelvis to demonstrate the iliac marrow spaces. Maximal barrier sterile technique utilized including caps, mask, sterile gowns, sterile gloves, large sterile drape, hand hygiene, and chlorhexidine prep. Under sterile conditions and local anesthesia, an 11 gauge coaxial bone biopsy needle was advanced into the RIGHT iliac marrow space. Needle position was confirmed with CT imaging. Initially, bone marrow aspiration was performed. Next, the 11 gauge outer cannula was utilized to obtain a 1 iliac bone marrow core biopsy. Needle was  removed. Hemostasis was obtained with compression. The patient tolerated the procedure well. Samples were prepared with the cytotechnologist. IMPRESSION: Successful CT-guided bone marrow aspiration and biopsy. Roanna Banning, MD [facility] Electronically Signed   By: Roanna Banning M.D.   On: 04/27/2023 12:32   DG Chest 2 View  Result Date: 04/23/2023 CLINICAL DATA:  Cough for 2 months Pain between shoulder blades EXAM: CHEST - 2 VIEW COMPARISON:  08/24/2019 FINDINGS: Cardiomediastinal silhouette and pulmonary vasculature are within normal limits. Lungs are hyperexpanded but  otherwise clear. Unchanged dextroconvex curvature of the thoracolumbar spine. IMPRESSION: 1. No acute cardiopulmonary process. 2. Hyperexpanded lungs, suspicious for emphysema. Electronically Signed   By: Acquanetta Belling M.D.   On: 04/23/2023 16:44      04/27/2023 Pathology Results   Surgical Pathology CASE: [id number] PATIENT: Stephanie Coup Bone Marrow Report  Clinical History: Myeloma  DIAGNOSIS:  BONE MARROW, ASPIRATE, CLOT, CORE: -Hypercellular bone marrow with plasma cell neoplasm -See comment  PERIPHERAL BLOOD: -Normocytic-normochromic anemia -Leukocytosis with neutrophilic left shift  COMMENT:  The bone marrow is hypercellular for age with increased number of plasma cells averaging 15% of all cells in the aspirate.  The clot and biopsy sections show interstitial infiltrates, numerous variably sized clusters in addition to an area of diffuse sheet of atypical plasma cells associated with kappa light chain restriction consistent with plasma cell neoplasm.  Correlation with cytogenetic and FISH studies is recommended.  MICROSCOPIC DESCRIPTION:  PERIPHERAL BLOOD SMEAR: The red blood cells display mild anisopoikilocytosis with mild to moderate polychromasia.  The white blood cells are slightly increased in number, mostly with neutrophils some of which display toxic granulation.  An occasional myelocyte is seen on scan.  The platelets are normal in number.  BONE MARROW ASPIRATE: Bone marrow particles present Erythroid precursors: Orderly and progressive maturation Granulocytic precursors: Orderly and progressive maturation for the most part Megakaryocytes: Abundant with scattered small and/or hypolobated forms or large cells. Lymphocytes/plasma cells: The plasma cells are increased in number representing 15% to all cells associated with atypical cytologic features characterized by cytomegaly and/or small nucleoli.  Large lymphoid aggregates are not seen.  TOUCH PREPARATIONS: A  mixture of cell types present but with increased number of plasma cells  CLOT AND BIOPSY: The sections show variable cellularity ranging from 40 to 90% focally.  There are numerous variably sized clusters of atypical plasma cells in addition to a diffuse large sheet particularly in the area of most cellularity.  The background shows trilineage hematopoiesis.  Immunohistochemical stains for CD138 in addition to in situ hybridization for kappa and lambda were performed with appropriate controls.  CD138 highlights the increased plasma cell component consisting of interstitial infiltrates, numerous variably sized clusters, and an area of diffuse sheet particularly in the core biopsy. The plasma cells display kappa light chain restriction.  IRON STAIN: Iron stains are performed on a bone marrow aspirate or touch imprint smear and section of clot. The controls stained appropriately.       Storage Iron: Decreased      Ring Sideroblasts: Absent  ADDITIONAL DATA/TESTING: The specimen was sent for cytogenetic analysis and FISH for multiple myeloma and a separate report will follow.    05/04/2023 Cancer Staging   Staging form: Plasma Cell Myeloma and Plasma Cell Disorders, AJCC 8th Edition - Clinical stage from 05/04/2023: RISS Stage II (Beta-2-microglobulin (mg/L): 5.6, Albumin (g/dL): 2.5, ISS: Stage III, High-risk cytogenetics: Absent, LDH: Normal) - Signed by [name], MD on 05/11/2023 Stage  prefix: Initial diagnosis Beta 2 microglobulin range (mg/L): Greater than or equal to 5.5 Albumin range (g/dL): Less than 3.5 Cytogenetics: No abnormalities   05/14/2023 -  Chemotherapy   Patient is on Treatment Plan : MYELOMA NEWLY DIAGNOSED TRANSPLANT CANDIDATE DaraVRd (Daratumumab SQ) q21d x 6 Cycles (Induction/Consolidation)     06/18/2023 Imaging   RIGHT:         - There is no evidence of deep vein thrombosis in the lower extremity.   - No cystic structure found in the popliteal fossa.   LEFT: -  No evidence of common femoral vein obstruction.         PHYSICAL EXAMINATION: ECOG PERFORMANCE STATUS: 1 - Symptomatic but completely ambulatory  Vitals:   06/18/23 1136  BP: (!) 123/51  Pulse: 69  Resp: 18  SpO2: 96%   Filed Weights   06/18/23 1136  Weight: 236 lb 6.4 oz (107.2 kg)    GENERAL:alert, no distress and comfortable SKIN: skin color, texture, turgor are normal, no rashes or significant lesions EYES: normal, Conjunctiva are pink and non-injected, sclera clear OROPHARYNX:no exudate, no erythema and lips, buccal mucosa, and tongue normal  NECK: supple, thyroid normal size, non-tender, without nodularity LYMPH:  no palpable lymphadenopathy in the cervical, axillary or inguinal LUNGS: clear to auscultation and percussion with normal breathing effort HEART: regular rate & rhythm and no murmurs moderate bilateral lower extremity edema ABDOMEN:abdomen soft, non-tender and normal bowel sounds Musculoskeletal:no cyanosis of digits and no clubbing  NEURO: alert & oriented x 3 with fluent speech, no focal motor/sensory deficits  LABORATORY DATA:  I have reviewed the data as listed    Component Value Date/Time   NA 136 06/18/2023 1049   NA 139 01/12/2017 1011   K 3.8 06/18/2023 1049   K 4.3 01/12/2017 1011   CL 102 06/18/2023 1049   CO2 28 06/18/2023 1049   CO2 23 01/12/2017 1011   GLUCOSE 122 (H) 06/18/2023 1049   GLUCOSE 98 01/12/2017 1011   BUN 29 (H) 06/18/2023 1049   BUN 21.8 01/12/2017 1011   CREATININE 0.90 06/18/2023 1049   CREATININE 0.9 01/12/2017 1011   CALCIUM 9.5 06/18/2023 1049   CALCIUM 9.0 01/12/2017 1011   PROT 7.3 06/18/2023 1049   PROT 6.4 01/12/2017 1011   PROT 6.1 01/12/2017 1011   ALBUMIN 3.3 (L) 06/18/2023 1049   ALBUMIN 3.6 01/12/2017 1011   AST 12 (L) 06/18/2023 1049   AST 20 01/12/2017 1011   ALT 16 06/18/2023 1049   ALT 18 01/12/2017 1011   ALKPHOS 117 06/18/2023 1049   ALKPHOS 81 01/12/2017 1011   BILITOT 0.3 06/18/2023 1049    BILITOT 0.58 01/12/2017 1011   GFRNONAA >60 06/18/2023 1049   GFRAA >60 02/14/2020 1115    No results found for: "SPEP", "UPEP"  Lab Results  Component Value Date   WBC 9.2 06/18/2023   NEUTROABS 6.9 06/18/2023   HGB 8.3 (L) 06/18/2023   HCT 25.1 (L) 06/18/2023   MCV 85.1 06/18/2023   PLT 275 06/18/2023      Chemistry      Component Value Date/Time   NA 136 06/18/2023 1049   NA 139 01/12/2017 1011   K 3.8 06/18/2023 1049   K 4.3 01/12/2017 1011   CL 102 06/18/2023 1049   CO2 28 06/18/2023 1049   CO2 23 01/12/2017 1011   BUN 29 (H) 06/18/2023 1049   BUN 21.8 01/12/2017 1011   CREATININE 0.90 06/18/2023 1049   CREATININE 0.9 01/12/2017 1011  Component Value Date/Time   CALCIUM 9.5 06/18/2023 1049   CALCIUM 9.0 01/12/2017 1011   ALKPHOS 117 06/18/2023 1049   ALKPHOS 81 01/12/2017 1011   AST 12 (L) 06/18/2023 1049   AST 20 01/12/2017 1011   ALT 16 06/18/2023 1049   ALT 18 01/12/2017 1011   BILITOT 0.3 06/18/2023 1049   BILITOT 0.58 01/12/2017 1011       RADIOGRAPHIC STUDIES: I have personally reviewed the radiological images as listed and agreed with the findings in the report. VAS [place] LOWER EXTREMITY VENOUS (DVT) Result Date: 06/18/2023  Lower Venous DVT Study Patient Name:  Daniel Whitaker  Date of Exam:   06/18/2023 Medical Rec #: [id number]         Accession #:    [id number] Date of Birth: 02-Sep-1945        Patient Gender: M Patient Age:   42 years Exam Location:  [facility] Procedure:      VAS [place] LOWER EXTREMITY VENOUS (DVT) Referring Phys: Layci Stenglein --------------------------------------------------------------------------------  Indications: Pain.  Risk Factors: Multiple myeloma, chemotherapy. Comparison Study: No previous exams Performing Technologist: Jody Hill RVT, RDMS  Examination Guidelines: A complete evaluation includes B-mode imaging, spectral Doppler, color Doppler, and power Doppler as needed of all accessible portions of each vessel.  Bilateral testing is considered an integral part of a complete examination. Limited examinations for reoccurring indications may be performed as noted. The reflux portion of the exam is performed with the patient in reverse Trendelenburg.  +---------+---------------+---------+-----------+----------+--------------+ RIGHT    CompressibilityPhasicitySpontaneityPropertiesThrombus Aging +---------+---------------+---------+-----------+----------+--------------+ CFV      Full           Yes      Yes                                 +---------+---------------+---------+-----------+----------+--------------+ SFJ      Full                                                        +---------+---------------+---------+-----------+----------+--------------+ FV Prox  Full           Yes      Yes                                 +---------+---------------+---------+-----------+----------+--------------+ FV Mid   Full           Yes      Yes                                 +---------+---------------+---------+-----------+----------+--------------+ FV DistalFull           Yes      Yes                                 +---------+---------------+---------+-----------+----------+--------------+ PFV      Full                                                        +---------+---------------+---------+-----------+----------+--------------+  POP      Full           Yes      Yes                                 +---------+---------------+---------+-----------+----------+--------------+ PTV      Full                                                        +---------+---------------+---------+-----------+----------+--------------+ PERO     Full                                                        +---------+---------------+---------+-----------+----------+--------------+   +----+---------------+---------+-----------+----------+--------------+  LEFTCompressibilityPhasicitySpontaneityPropertiesThrombus Aging +----+---------------+---------+-----------+----------+--------------+ CFV Full           Yes      Yes                                 +----+---------------+---------+-----------+----------+--------------+     Summary: RIGHT: - There is no evidence of deep vein thrombosis in the lower extremity.  - No cystic structure found in the popliteal fossa.  LEFT: - No evidence of common femoral vein obstruction.   *See table(s) above for measurements and observations.    Preliminary    DG Chest 2 View Result Date: 05/25/2023 CLINICAL DATA:  Acute cough.  Shortness of breath. EXAM: CHEST - 2 VIEW COMPARISON:  Most recent radiograph 05/02/2023, CT 05/03/2023 FINDINGS: Vague opacity in the periphery of the left upper lobe is new from prior exam and may represent acute infection. Small left pleural effusion and basilar opacity is unchanged from prior exam. Previous right pleural effusion is not definitively seen by radiograph. Known bilateral rib fractures, some of which demonstrate interval callus formation from earlier this month. Stable heart size and mediastinal contours. No pneumothorax. IMPRESSION: 1. Vague opacity in the periphery of the left upper lobe is new from prior exam and may represent acute infection. 2. Small left pleural effusion and basilar opacity is unchanged from prior exam. 3. Known bilateral rib fractures, some of which demonstrate interval callus formation from earlier this month. Electronically Signed   By: Narda Rutherford M.D.   On: 05/25/2023 07:15

## 2023-06-18 NOTE — Assessment & Plan Note (Signed)
This has resolved.  I gave the patient verbal and written instruction to reduce dexamethasone and to discontinue nasal calcitonin We would defer Zometa until he is able to get dental clearance

## 2023-06-18 NOTE — Assessment & Plan Note (Signed)
I reviewed recent myeloma panel with the patient and family He has excellent partial response to treatment after only 2 doses of chemotherapy We will proceed with treatment as scheduled and continue myeloma monitoring once a month I recommend we delay dental clearance for Zometa due to high-dose dexamethasone with concern for wound healing and persistent anemia He will continue with vitamin D supplement and acyclovir for antimicrobial prophylaxis He will continue on aspirin for DVT prophylaxis and I recommend higher dose of aspirin for now

## 2023-06-18 NOTE — Assessment & Plan Note (Signed)
His rib pain has improved He will continue to take oxycodone as needed

## 2023-06-18 NOTE — Assessment & Plan Note (Signed)
 This is likely anemia of chronic disease and from recent renal failure and chemotherapy. The patient denies recent history of bleeding such as epistaxis, hematuria or hematochezia. He is asymptomatic from the anemia. We will observe for now.

## 2023-06-22 ENCOUNTER — Telehealth: Payer: Self-pay

## 2023-06-22 DIAGNOSIS — C9000 Multiple myeloma not having achieved remission: Secondary | ICD-10-CM | POA: Diagnosis not present

## 2023-06-22 DIAGNOSIS — R0902 Hypoxemia: Secondary | ICD-10-CM | POA: Diagnosis not present

## 2023-06-22 DIAGNOSIS — I119 Hypertensive heart disease without heart failure: Secondary | ICD-10-CM | POA: Diagnosis not present

## 2023-06-22 DIAGNOSIS — I2510 Atherosclerotic heart disease of native coronary artery without angina pectoris: Secondary | ICD-10-CM | POA: Diagnosis not present

## 2023-06-22 DIAGNOSIS — D649 Anemia, unspecified: Secondary | ICD-10-CM | POA: Diagnosis not present

## 2023-06-22 DIAGNOSIS — J9811 Atelectasis: Secondary | ICD-10-CM | POA: Diagnosis not present

## 2023-06-22 DIAGNOSIS — S2243XD Multiple fractures of ribs, bilateral, subsequent encounter for fracture with routine healing: Secondary | ICD-10-CM | POA: Diagnosis not present

## 2023-06-22 DIAGNOSIS — N179 Acute kidney failure, unspecified: Secondary | ICD-10-CM | POA: Diagnosis not present

## 2023-06-22 NOTE — Telephone Encounter (Signed)
Returned his call. He is still complaining of right leg pain. He is taking oxycodone as ordered, applying heat and tried muscle rub. He if Dr. Bertis Ruddy thinks referral to PT would help. He has seen PT in the past for his shoulder and knee. He went to [facility] in the past.  He will start taking tylenol prn and try lidocaine ointment.

## 2023-06-23 ENCOUNTER — Other Ambulatory Visit: Payer: Self-pay | Admitting: Hematology and Oncology

## 2023-06-23 ENCOUNTER — Telehealth: Payer: Self-pay | Admitting: *Deleted

## 2023-06-23 DIAGNOSIS — C9000 Multiple myeloma not having achieved remission: Secondary | ICD-10-CM

## 2023-06-23 NOTE — Telephone Encounter (Signed)
Hi Karen/Porsche,  I can send referral for PT

## 2023-06-23 NOTE — Telephone Encounter (Signed)
Pt called tp make office aware that he does not want to do the PT referral. He stated that he think the pain was coming from his chair and he decided to sleep in the bed and felt 10 times better. He stated he will call the office if the pain increases and request the PT referral.

## 2023-06-24 ENCOUNTER — Other Ambulatory Visit: Payer: Self-pay

## 2023-06-24 ENCOUNTER — Telehealth: Payer: Self-pay

## 2023-06-24 DIAGNOSIS — C9000 Multiple myeloma not having achieved remission: Secondary | ICD-10-CM

## 2023-06-24 NOTE — Telephone Encounter (Signed)
Called and offered a earlier appt tomorrow. He is agreeable and aware of new appt times. He is asking for a referral to [facility] for PT. He had a bad night sleeping and is having right leg pain. Faxed referral for PT to [phone], received fax confirmation.  Forwarded above with Dr. Bertis Ruddy.

## 2023-06-25 ENCOUNTER — Inpatient Hospital Stay: Payer: Medicare Other

## 2023-06-25 ENCOUNTER — Inpatient Hospital Stay: Payer: Medicare Other | Admitting: Hematology and Oncology

## 2023-06-25 ENCOUNTER — Other Ambulatory Visit: Payer: Self-pay | Admitting: *Deleted

## 2023-06-25 VITALS — BP 125/44 | HR 67 | Resp 18 | Ht 73.0 in | Wt 238.4 lb

## 2023-06-25 DIAGNOSIS — M79604 Pain in right leg: Secondary | ICD-10-CM

## 2023-06-25 DIAGNOSIS — D638 Anemia in other chronic diseases classified elsewhere: Secondary | ICD-10-CM

## 2023-06-25 DIAGNOSIS — Z5112 Encounter for antineoplastic immunotherapy: Secondary | ICD-10-CM | POA: Diagnosis not present

## 2023-06-25 DIAGNOSIS — Z7961 Long term (current) use of immunomodulator: Secondary | ICD-10-CM | POA: Diagnosis not present

## 2023-06-25 DIAGNOSIS — D539 Nutritional anemia, unspecified: Secondary | ICD-10-CM | POA: Diagnosis not present

## 2023-06-25 DIAGNOSIS — R0601 Orthopnea: Secondary | ICD-10-CM | POA: Diagnosis not present

## 2023-06-25 DIAGNOSIS — Z79899 Other long term (current) drug therapy: Secondary | ICD-10-CM | POA: Diagnosis not present

## 2023-06-25 DIAGNOSIS — C9000 Multiple myeloma not having achieved remission: Secondary | ICD-10-CM

## 2023-06-25 DIAGNOSIS — D801 Nonfamilial hypogammaglobulinemia: Secondary | ICD-10-CM

## 2023-06-25 DIAGNOSIS — G893 Neoplasm related pain (acute) (chronic): Secondary | ICD-10-CM | POA: Diagnosis not present

## 2023-06-25 LAB — CMP (CANCER CENTER ONLY)
ALT: 18 U/L (ref 0–44)
AST: 16 U/L (ref 15–41)
Albumin: 3.3 g/dL — ABNORMAL LOW (ref 3.5–5.0)
Alkaline Phosphatase: 136 U/L — ABNORMAL HIGH (ref 38–126)
Anion gap: 7 (ref 5–15)
BUN: 18 mg/dL (ref 8–23)
CO2: 28 mmol/L (ref 22–32)
Calcium: 9.5 mg/dL (ref 8.9–10.3)
Chloride: 103 mmol/L (ref 98–111)
Creatinine: 0.86 mg/dL (ref 0.61–1.24)
GFR, Estimated: >60 mL/min (ref >60)
Glucose, Bld: 130 mg/dL — ABNORMAL HIGH (ref 70–99)
Potassium: 3.6 mmol/L (ref 3.5–5.1)
Sodium: 138 mmol/L (ref 135–145)
Total Bilirubin: 0.3 mg/dL (ref 0.0–1.2)
Total Protein: 7.3 g/dL (ref 6.5–8.1)

## 2023-06-25 LAB — CBC WITH DIFFERENTIAL (CANCER CENTER ONLY)
Abs Immature Granulocytes: 0.03 10*3/uL (ref 0.00–0.07)
Basophils Absolute: 0.1 10*3/uL (ref 0.0–0.1)
Basophils Relative: 1 %
Eosinophils Absolute: 0.1 10*3/uL (ref 0.0–0.5)
Eosinophils Relative: 1 %
HCT: 27.4 % — ABNORMAL LOW (ref 39.0–52.0)
Hemoglobin: 8.6 g/dL — ABNORMAL LOW (ref 13.0–17.0)
Immature Granulocytes: 0 %
Lymphocytes Relative: 9 %
Lymphs Abs: 0.7 10*3/uL (ref 0.7–4.0)
MCH: 28.3 pg (ref 26.0–34.0)
MCHC: 31.4 g/dL (ref 30.0–36.0)
MCV: 90.1 fL (ref 80.0–100.0)
Monocytes Absolute: 0.5 10*3/uL (ref 0.1–1.0)
Monocytes Relative: 6 %
Neutro Abs: 6.6 10*3/uL (ref 1.7–7.7)
Neutrophils Relative %: 83 %
Platelet Count: 199 10*3/uL (ref 150–400)
RBC: 3.04 MIL/uL — ABNORMAL LOW (ref 4.22–5.81)
RDW: 18.8 % — ABNORMAL HIGH (ref 11.5–15.5)
WBC Count: 7.9 10*3/uL (ref 4.0–10.5)
nRBC: 0 % (ref 0.0–0.2)

## 2023-06-25 LAB — SAMPLE TO BLOOD BANK

## 2023-06-25 MED ORDER — ACETAMINOPHEN 325 MG PO TABS: 650.0000 mg | ORAL_TABLET | Freq: Once | ORAL | Status: DC

## 2023-06-25 MED ORDER — DARATUMUMAB-HYALURONIDASE-FIHJ 1800-30000 MG-UT/15ML ~~LOC~~ SOLN
1800.0000 mg | Freq: Once | SUBCUTANEOUS | Status: AC
  Administered 2023-06-25: 1800 mg via SUBCUTANEOUS
  Filled 2023-06-25: qty 15

## 2023-06-25 MED ORDER — DIPHENHYDRAMINE HCL 25 MG PO CAPS
25.0000 mg | ORAL_CAPSULE | Freq: Once | ORAL | Status: AC
  Administered 2023-06-25: 25 mg via ORAL
  Filled 2023-06-25: qty 1

## 2023-06-25 NOTE — Patient Instructions (Signed)
[facility] - A DEPT OF [facility]  Discharge Instructions: Thank you for choosing [facility] to provide your oncology and hematology care.   If you have a lab appointment with the Cancer Center, please go directly to the Cancer Center and check in at the registration area.   Wear comfortable clothing and clothing appropriate for easy access to any Portacath or PICC line.   We strive to give you quality time with your provider. You may need to reschedule your appointment if you arrive late (15 or more minutes).  Arriving late affects you and other patients whose appointments are after yours.  Also, if you miss three or more appointments without notifying the office, you may be dismissed from the clinic at the provider's discretion.      For prescription refill requests, have your pharmacy contact our office and allow 72 hours for refills to be completed.    Today you received the following chemotherapy and/or immunotherapy agents: Darzalex Faspro.       To help prevent nausea and vomiting after your treatment, we encourage you to take your nausea medication as directed.  BELOW ARE SYMPTOMS THAT SHOULD BE REPORTED IMMEDIATELY: *FEVER GREATER THAN 100.4 F (38 C) OR HIGHER *CHILLS OR SWEATING *NAUSEA AND VOMITING THAT IS NOT CONTROLLED WITH YOUR NAUSEA MEDICATION *UNUSUAL SHORTNESS OF BREATH *UNUSUAL BRUISING OR BLEEDING *URINARY PROBLEMS (pain or burning when urinating, or frequent urination) *BOWEL PROBLEMS (unusual diarrhea, constipation, pain near the anus) TENDERNESS IN MOUTH AND THROAT WITH OR WITHOUT PRESENCE OF ULCERS (sore throat, sores in mouth, or a toothache) UNUSUAL RASH, SWELLING OR PAIN  UNUSUAL VAGINAL DISCHARGE OR ITCHING   Items with * indicate a potential emergency and should be followed up as soon as possible or go to the Emergency Department if any problems should occur.  Please show the CHEMOTHERAPY ALERT CARD or  IMMUNOTHERAPY ALERT CARD at check-in to the Emergency Department and triage nurse.  Should you have questions after your visit or need to cancel or reschedule your appointment, please contact [facility] - A DEPT OF [facility]  Dept: [phone]  and follow the prompts.  Office hours are 8:00 a.m. to 4:30 p.m. Monday - Friday. Please note that voicemails left after 4:00 p.m. may not be returned until the following business day.  We are closed weekends and major holidays. You have access to a nurse at all times for urgent questions. Please call the main number to the clinic Dept: [phone] and follow the prompts.   For any non-urgent questions, you may also contact your provider using MyChart. We now offer e-Visits for anyone 58 and older to request care online for non-urgent symptoms. For details visit [email or web address].   Also download the MyChart app! Go to the app store, search "MyChart", open the app, select , and log in with your MyChart username and password.

## 2023-06-28 ENCOUNTER — Encounter: Payer: Self-pay | Admitting: Hematology and Oncology

## 2023-06-28 LAB — KAPPA/LAMBDA LIGHT CHAINS
Kappa free light chain: 357 mg/L — ABNORMAL HIGH (ref 3.3–19.4)
Kappa, lambda light chain ratio: 170 — ABNORMAL HIGH (ref 0.26–1.65)
Lambda free light chains: 2.1 mg/L — ABNORMAL LOW (ref 5.7–26.3)

## 2023-06-28 MED ORDER — DEXAMETHASONE 4 MG PO TABS: ORAL_TABLET | ORAL | Status: DC

## 2023-06-28 MED ORDER — FUROSEMIDE 20 MG PO TABS: 20.0000 mg | ORAL_TABLET | Freq: Every day | ORAL | Status: DC

## 2023-06-28 NOTE — Progress Notes (Signed)
[name] OFFICE PROGRESS NOTE  Patient Care Team: [name], MD as PCP - General (Family Medicine) [name], MD as PCP - Cardiology (Cardiology)  ASSESSMENT & PLAN:  Multiple myeloma without remission [facility]) Recent myeloma panel showed excellent partial response to treatment after only 2 doses of chemotherapy We will proceed with treatment as scheduled and continue myeloma monitoring once a month I recommend we delay dental clearance for Zometa due to high-dose dexamethasone with concern for wound healing and persistent anemia He will continue with vitamin D supplement and acyclovir for antimicrobial prophylaxis He will continue on aspirin for DVT prophylaxis and I recommend higher dose of aspirin for now  Acquired hypogammaglobulinemia (HCC) He tolerated IVIG well He will continue IVIG infusion monthly  Hypercalcemia This has resolved.  I gave the patient verbal and written instruction to reduce dexamethasone and lasix We would defer Zometa until he is able to get dental clearance  Right leg pain Venous Doppler ultrasound is negative for DVT His physical examination is most consistent with possible muscular strain I recommend conservative approach with over-the-counter remedies such as heat pad, topical lidocaine and he has pain medicine to take as needed  Orders Placed This Encounter  Procedures   CMP (Cancer Center only)    Standing Status:   Future    Number of Occurrences:   1    Expiration Date:   06/24/2024    All questions were answered. The patient knows to call the clinic with any problems, questions or concerns. The total time spent in the appointment was 30 minutes encounter with patients including review of chart and various tests results, discussions about plan of care and coordination of care plan   [name], MD 06/28/2023 8:12 AM  INTERVAL HISTORY: Please see below for problem oriented charting. he returns for chemo  follow-up He continues to have right leg pain and mild rib pain No worsening neuropathy We discussed test results and medication adjustment  REVIEW OF SYSTEMS:   Constitutional: Denies fevers, chills or abnormal weight loss Eyes: Denies blurriness of vision Ears, nose, mouth, throat, and face: Denies mucositis or sore throat Respiratory: Denies cough, dyspnea or wheezes Cardiovascular: Denies palpitation, chest discomfort or lower extremity swelling Gastrointestinal:  Denies nausea, heartburn or change in bowel habits Skin: Denies abnormal skin rashes Lymphatics: Denies new lymphadenopathy or easy bruising Neurological:Denies numbness, tingling or new weaknesses Behavioral/Psych: Mood is stable, no new changes  All other systems were reviewed with the patient and are negative.  I have reviewed the past medical history, past surgical history, social history and family history with the patient and they are unchanged from previous note.  ALLERGIES:  is allergic to atorvastatin, cymbalta [duloxetine hcl], and penicillins.  MEDICATIONS:  Current Outpatient Medications  Medication Sig Dispense Refill   acyclovir (ZOVIRAX) 400 MG tablet Take 1 tablet (400 mg total) by mouth daily. 30 tablet 6   albuterol (VENTOLIN HFA) 108 (90 Base) MCG/ACT inhaler Inhale 1-2 puffs into the lungs every 6 (six) hours as needed for wheezing or shortness of breath.     allopurinol (ZYLOPRIM) 300 MG tablet Take 1 tablet (300 mg total) by mouth daily. 30 tablet 0   aspirin EC 325 MG tablet Take 1 tablet (325 mg total) by mouth daily.     brimonidine (ALPHAGAN) 0.2 % ophthalmic solution Place 1 drop into the left eye in the morning and at bedtime.     carvedilol (COREG) 3.125 MG tablet TAKE 1 TABLET BY MOUTH  TWICE DAILY 180 tablet 3   cetirizine (ZYRTEC) 10 MG tablet Take 10 mg by mouth every morning.     Cholecalciferol (VITAMIN D3) 50 MCG (2000 UT) TABS Take 2,000 Units by mouth daily.     cyclobenzaprine  (FLEXERIL) 10 MG tablet Take 10 mg by mouth 3 (three) times daily as needed for muscle spasms.     dexamethasone (DECADRON) 4 MG tablet Take 1/2 pill daily except take 5 pills once a week on Fridays.     docusate sodium (COLACE) 100 MG capsule Take 100-200 mg by mouth 2 (two) times daily as needed for mild constipation.     dorzolamide-timolol (COSOPT) 22.3-6.8 MG/ML ophthalmic solution Place 1 drop into both eyes 2 (two) times daily.     furosemide (LASIX) 20 MG tablet Take 1 tablet (20 mg total) by mouth daily.     ketoconazole (NIZORAL) 2 % cream Apply 1 application  topically daily as needed for irritation.     latanoprost (XALATAN) 0.005 % ophthalmic solution Place 1 drop into both eyes at bedtime.     lenalidomide (REVLIMID) 10 MG capsule Take 1 capsule (10 mg total) by mouth daily. Take 1 capsule daily for 14 days on and 7 days off, repeat every 21 days. [employer] Auth # [id number]   Date Obtained 06/16/2023 14 capsule 0   lidocaine (XYLOCAINE) 5 % ointment Apply 1 Application topically 2 (two) times daily as needed. (Patient taking differently: Apply 1 Application topically 2 (two) times daily as needed (for foot pain).) 35.44 g 1   magnesium oxide (MAG-OX) 400 (240 Mg) MG tablet Take 400 mg by mouth daily.     Multiple Vitamins-Minerals (CENTRUM SILVER 50+MEN) TABS Take 1 tablet by mouth daily with breakfast.     nitroGLYCERIN (NITROSTAT) 0.4 MG SL tablet Place 1 tablet (0.4 mg total) under the tongue every 5 (five) minutes as needed. (Patient taking differently: Place 0.4 mg under the tongue every 5 (five) minutes as needed for chest pain.) 25 tablet 2   Omega-3 Fatty Acids (FISH OIL) 1200 MG CAPS Take 3,600 mg by mouth daily. 3     ondansetron (ZOFRAN) 8 MG tablet Take 1 tablet (8 mg total) by mouth every 8 (eight) hours as needed for nausea or vomiting. 30 tablet 1   Oxycodone HCl 10 MG TABS Take 1 tablet (10 mg total) by mouth every 4 (four) hours as needed. 90 tablet 0   pantoprazole  (PROTONIX) 40 MG tablet Take 40 mg by mouth daily before breakfast.     polyethylene glycol (MIRALAX / GLYCOLAX) 17 g packet Take 17 g by mouth daily.     pregabalin (LYRICA) 100 MG capsule TAKE 1 CAPSULE BY MOUTH IN THE MORNING, AND AT NOON, AND 2 CAPSULES AT NIGHT (Patient taking differently: Take 100 mg by mouth in the morning and at bedtime.) 120 capsule 5   prochlorperazine (COMPAZINE) 10 MG tablet Take 1 tablet (10 mg total) by mouth every 6 (six) hours as needed for nausea or vomiting. 30 tablet 1   rosuvastatin (CRESTOR) 40 MG tablet TAKE 1 TABLET BY MOUTH DAILY 90 tablet 0   senna-docusate (SENOKOT-S) 8.6-50 MG tablet Take 1 tablet by mouth 2 (two) times daily.     triamcinolone (NASACORT) 55 MCG/ACT AERO nasal inhaler Place 2 sprays into the nose daily. 1 each 12   TYLENOL 500 MG tablet Take 500-1,000 mg by mouth every 6 (six) hours as needed for mild pain (pain score 1-3) or headache.  No current facility-administered medications for this visit.    SUMMARY OF ONCOLOGIC HISTORY: Oncology History Overview Note  Myeloma FISH was neg Normal cytogenetics   Multiple myeloma without remission (HCC)  07/11/2013 Initial Diagnosis   Multiple myeloma without remission (HCC)   04/22/2023 PET scan   NM PET Image Initial (PI) Whole Body (F-18 FDG)  Result Date: 05/03/2023 CLINICAL DATA:  Subsequent treatment strategy for multiple myeloma. EXAM: NUCLEAR MEDICINE PET WHOLE BODY TECHNIQUE: 13 mCi F-18 FDG was injected intravenously. Full-ring PET imaging was performed from the head to foot after the radiotracer. CT data was obtained and used for attenuation correction and anatomic localization. Fasting blood glucose: 114 mg/dl COMPARISON:  None Available. FINDINGS: Mediastinal blood pool activity: SUV max 2.0 HEAD/NECK: No hypermetabolic activity in the scalp. No hypermetabolic cervical lymph nodes. Incidental CT findings: Is CHEST: No hypermetabolic mediastinal or hilar nodes. No suspicious  pulmonary nodules on the CT scan. Incidental CT findings: none ABDOMEN/PELVIS: No abnormal hypermetabolic activity within the liver, pancreas, adrenal glands, or spleen. No hypermetabolic lymph nodes in the abdomen or pelvis. Incidental CT findings: none SKELETON: There multiple foci focal radiotracer activity within the skeleton. Lesions are accompaniedlytic change on CT portion exam. There approximately 50 lesions. For example lesion the posterior RIGHT fifth rib with SUV max equal 3.6 on image 120. There is a lucent lesion and pathologic fracture associated with hypermetabolic activity the posterior LEFT sixth rib with SUV max equal 6.4 on image 129. Multiple lesions throughout the spine. Most intense lesion spinal lesion has associated soft tissue expansion at the the T2 vertebral body measuring 2.5 cm (107) with SUV max equal 7.3 Lesion in the inferior LEFT pubic rami subtle lucency on image 299. Suspicious lesions in LEFT and RIGHT femoral necks. Incidental CT findings: none EXTREMITIES: Hypermetabolic lesions RIGHT humeral shaft. Lesion the proximal LEFT and RIGHT femurs. Incidental CT findings: none IMPRESSION: 1. Multiple hypermetabolic lytic lesions throughout the skeleton consistent with multiple myeloma. Approximately 50 lesions within the axillary and appendicular skeleton. Many lesions accompanied lytic/lucent lesions on CT. 2. No evidence of soft tissue plasmacytoma. Electronically Signed   By: Genevive Bi M.D.   On: 05/03/2023 15:16   CT Chest W Contrast  Result Date: 05/03/2023 CLINICAL DATA:  Pneumonia with complication suspected. X-ray done. Cough with rib pain after coughing. Shortness of breath and right-sided pain. History of multiple myeloma. EXAM: CT CHEST WITH CONTRAST TECHNIQUE: Multidetector CT imaging of the chest was performed during intravenous contrast administration. RADIATION DOSE REDUCTION: This exam was performed according to the departmental dose-optimization program  which includes automated exposure control, adjustment of the mA and/or kV according to patient size and/or use of iterative reconstruction technique. CONTRAST:  60mL OMNIPAQUE IOHEXOL 300 MG/ML  SOLN COMPARISON:  Chest radiograph 05/02/2023.  PET-CT 04/22/2023 FINDINGS: Cardiovascular: Mild cardiac enlargement. No pericardial effusions. Normal caliber thoracic aorta. Calcification of the aorta and coronary arteries. Mediastinum/Nodes: Thyroid gland is unremarkable. Esophagus is decompressed. Scattered mediastinal lymph nodes are not pathologically enlarged. Lungs/Pleura: Motion artifact limits examination. There is evidence of atelectasis or consolidation in both lung bases. Small bilateral pleural effusions. Changes could represent pneumonia. No pneumothorax. Upper Abdomen: No acute abnormalities. Musculoskeletal: Degenerative changes in the spine and shoulders. Multiple lucent lesions demonstrated in the ribs, spine, and shoulders consistent with history of myeloma. Fractures of the right lateral fifth, sixth, and seventh ribs as well as the right tenth rib. Fractures of the left third, fourth, and sixth ribs. No vertebral compression deformities are demonstrated  although large lucent lesions are demonstrated in the T1 and T2 vertebrae. IMPRESSION: 1. Multiple lucent lesions demonstrated in the ribs, spine, and shoulders corresponding to the patient's history of multiple myeloma. 2. Multiple bilateral acute rib fractures. 3. Small bilateral pleural effusions with basilar atelectasis or consolidation, possibly pneumonia. Electronically Signed   By: Burman Nieves M.D.   On: 05/03/2023 01:21   DG Chest Port 1 View  Result Date: 05/02/2023 CLINICAL DATA:  Shortness of breath and cough. History of multiple myeloma. Right-sided pain. EXAM: PORTABLE CHEST 1 VIEW COMPARISON:  04/20/2023 FINDINGS: Shallow inspiration. Cardiac enlargement. No vascular congestion or edema. Linear atelectasis in the lung bases. No  pleural effusions. No pneumothorax. Calcification of the aorta. A nondisplaced right lateral ninth rib fracture is suggested. IMPRESSION: Shallow inspiration with atelectasis in the lung bases. Cardiac enlargement. Probable right ninth rib fracture. Electronically Signed   By: Burman Nieves M.D.   On: 05/02/2023 23:45   CT BONE MARROW BIOPSY & ASPIRATION  Result Date: 04/27/2023 INDICATION: myeloma staging EXAM: CT GUIDED BONE MARROW ASPIRATION AND CORE BIOPSY MEDICATIONS: None. ANESTHESIA/SEDATION: Moderate (conscious) sedation was employed during this procedure. A total of Versed 2 mg and Fentanyl 100 mcg was administered intravenously. Moderate Sedation Time: 13 minutes. The patient's level of consciousness and vital signs were monitored continuously by radiology nursing throughout the procedure under my direct supervision. FLUOROSCOPY TIME:  CT dose; 122 mGycm COMPLICATIONS: None immediate. Estimated blood loss: <5 mL PROCEDURE: RADIATION DOSE REDUCTION: This exam was performed according to the departmental dose-optimization program which includes automated exposure control, adjustment of the mA and/or kV according to patient size and/or use of iterative reconstruction technique. Informed written consent was obtained from the patient after a thorough discussion of the procedural risks, benefits and alternatives. All questions were addressed. Maximal Sterile Barrier Technique was utilized including caps, mask, sterile gowns, sterile gloves, sterile drape, hand hygiene and skin antiseptic. A timeout was performed prior to the initiation of the procedure. The patient was positioned prone and non-contrast localization CT was performed of the pelvis to demonstrate the iliac marrow spaces. Maximal barrier sterile technique utilized including caps, mask, sterile gowns, sterile gloves, large sterile drape, hand hygiene, and chlorhexidine prep. Under sterile conditions and local anesthesia, an 11 gauge coaxial  bone biopsy needle was advanced into the RIGHT iliac marrow space. Needle position was confirmed with CT imaging. Initially, bone marrow aspiration was performed. Next, the 11 gauge outer cannula was utilized to obtain a 1 iliac bone marrow core biopsy. Needle was removed. Hemostasis was obtained with compression. The patient tolerated the procedure well. Samples were prepared with the cytotechnologist. IMPRESSION: Successful CT-guided bone marrow aspiration and biopsy. Roanna Banning, MD [facility] Electronically Signed   By: Roanna Banning M.D.   On: 04/27/2023 12:32   DG Chest 2 View  Result Date: 04/23/2023 CLINICAL DATA:  Cough for 2 months Pain between shoulder blades EXAM: CHEST - 2 VIEW COMPARISON:  08/24/2019 FINDINGS: Cardiomediastinal silhouette and pulmonary vasculature are within normal limits. Lungs are hyperexpanded but otherwise clear. Unchanged dextroconvex curvature of the thoracolumbar spine. IMPRESSION: 1. No acute cardiopulmonary process. 2. Hyperexpanded lungs, suspicious for emphysema. Electronically Signed   By: Acquanetta Belling M.D.   On: 04/23/2023 16:44      04/27/2023 Pathology Results   Surgical Pathology CASE: [phone] [phone] PATIENT: Daniel Whitaker Bone Marrow Report  Clinical History: Myeloma  DIAGNOSIS:  BONE MARROW, ASPIRATE, CLOT, CORE: -Hypercellular bone marrow with plasma cell  neoplasm -See comment  PERIPHERAL BLOOD: -Normocytic-normochromic anemia -Leukocytosis with neutrophilic left shift  COMMENT:  The bone marrow is hypercellular for age with increased number of plasma cells averaging 15% of all cells in the aspirate.  The clot and biopsy sections show interstitial infiltrates, numerous variably sized clusters in addition to an area of diffuse sheet of atypical plasma cells associated with kappa light chain restriction consistent with plasma cell neoplasm.  Correlation with cytogenetic and FISH  studies is recommended.  MICROSCOPIC DESCRIPTION:  PERIPHERAL BLOOD SMEAR: The red blood cells display mild anisopoikilocytosis with mild to moderate polychromasia.  The white blood cells are slightly increased in number, mostly with neutrophils some of which display toxic granulation.  An occasional myelocyte is seen on scan.  The platelets are normal in number.  BONE MARROW ASPIRATE: Bone marrow particles present Erythroid precursors: Orderly and progressive maturation Granulocytic precursors: Orderly and progressive maturation for the most part Megakaryocytes: Abundant with scattered small and/or hypolobated forms or large cells. Lymphocytes/plasma cells: The plasma cells are increased in number representing 15% to all cells associated with atypical cytologic features characterized by cytomegaly and/or small nucleoli.  Large lymphoid aggregates are not seen.  TOUCH PREPARATIONS: A mixture of cell types present but with increased number of plasma cells  CLOT AND BIOPSY: The sections show variable cellularity ranging from 40 to 90% focally.  There are numerous variably sized clusters of atypical plasma cells in addition to a diffuse large sheet particularly in the area of most cellularity.  The background shows trilineage hematopoiesis.  Immunohistochemical stains for CD138 in addition to in situ hybridization for kappa and lambda were performed with appropriate controls.  CD138 highlights the increased plasma cell component consisting of interstitial infiltrates, numerous variably sized clusters, and an area of diffuse sheet particularly in the core biopsy. The plasma cells display kappa light chain restriction.  IRON STAIN: Iron stains are performed on a bone marrow aspirate or touch imprint smear and section of clot. The controls stained appropriately.       Storage Iron: Decreased      Ring Sideroblasts: Absent  ADDITIONAL DATA/TESTING: The specimen was sent for cytogenetic analysis and FISH  for multiple myeloma and a separate report will follow.    05/04/2023 Cancer Staging   Staging form: Plasma Cell Myeloma and Plasma Cell Disorders, AJCC 8th Edition - Clinical stage from 05/04/2023: RISS Stage II (Beta-2-microglobulin (mg/L): 5.6, Albumin (g/dL): 2.5, ISS: Stage III, High-risk cytogenetics: Absent, LDH: Normal) - Signed by [name], MD on 05/11/2023 Stage prefix: Initial diagnosis Beta 2 microglobulin range (mg/L): Greater than or equal to 5.5 Albumin range (g/dL): Less than 3.5 Cytogenetics: No abnormalities   05/14/2023 -  Chemotherapy   Patient is on Treatment Plan : MYELOMA NEWLY DIAGNOSED TRANSPLANT CANDIDATE DaraVRd (Daratumumab SQ) q21d x 6 Cycles (Induction/Consolidation)     06/18/2023 Imaging   RIGHT:         - There is no evidence of deep vein thrombosis in the lower extremity.   - No cystic structure found in the popliteal fossa.   LEFT: - No evidence of common femoral vein obstruction.         PHYSICAL EXAMINATION: ECOG PERFORMANCE STATUS: 2 - Symptomatic, <50% confined to bed  Vitals:   06/25/23 1325  BP: (!) 125/44  Pulse: 67  Resp: 18  SpO2: 100%   Filed Weights   06/25/23 1325  Weight: 238 lb 6.4 oz (108.1 kg)    GENERAL:alert, no distress and comfortable LABORATORY  DATA:  I have reviewed the data as listed    Component Value Date/Time   NA 138 06/25/2023 1252   NA 139 01/12/2017 1011   K 3.6 06/25/2023 1252   K 4.3 01/12/2017 1011   CL 103 06/25/2023 1252   CO2 28 06/25/2023 1252   CO2 23 01/12/2017 1011   GLUCOSE 130 (H) 06/25/2023 1252   GLUCOSE 98 01/12/2017 1011   BUN 18 06/25/2023 1252   BUN 21.8 01/12/2017 1011   CREATININE 0.86 06/25/2023 1252   CREATININE 0.9 01/12/2017 1011   CALCIUM 9.5 06/25/2023 1252   CALCIUM 9.0 01/12/2017 1011   PROT 7.3 06/25/2023 1252   PROT 6.4 01/12/2017 1011   PROT 6.1 01/12/2017 1011   ALBUMIN 3.3 (L) 06/25/2023 1252   ALBUMIN 3.6 01/12/2017 1011   AST 16 06/25/2023 1252    AST 20 01/12/2017 1011   ALT 18 06/25/2023 1252   ALT 18 01/12/2017 1011   ALKPHOS 136 (H) 06/25/2023 1252   ALKPHOS 81 01/12/2017 1011   BILITOT 0.3 06/25/2023 1252   BILITOT 0.58 01/12/2017 1011   GFRNONAA >60 06/25/2023 1252   GFRAA >60 02/14/2020 1115    No results found for: "SPEP", "UPEP"  Lab Results  Component Value Date   WBC 7.9 06/25/2023   NEUTROABS 6.6 06/25/2023   HGB 8.6 (L) 06/25/2023   HCT 27.4 (L) 06/25/2023   MCV 90.1 06/25/2023   PLT 199 06/25/2023      Chemistry      Component Value Date/Time   NA 138 06/25/2023 1252   NA 139 01/12/2017 1011   K 3.6 06/25/2023 1252   K 4.3 01/12/2017 1011   CL 103 06/25/2023 1252   CO2 28 06/25/2023 1252   CO2 23 01/12/2017 1011   BUN 18 06/25/2023 1252   BUN 21.8 01/12/2017 1011   CREATININE 0.86 06/25/2023 1252   CREATININE 0.9 01/12/2017 1011      Component Value Date/Time   CALCIUM 9.5 06/25/2023 1252   CALCIUM 9.0 01/12/2017 1011   ALKPHOS 136 (H) 06/25/2023 1252   ALKPHOS 81 01/12/2017 1011   AST 16 06/25/2023 1252   AST 20 01/12/2017 1011   ALT 18 06/25/2023 1252   ALT 18 01/12/2017 1011   BILITOT 0.3 06/25/2023 1252   BILITOT 0.58 01/12/2017 1011       RADIOGRAPHIC STUDIES: I have personally reviewed the radiological images as listed and agreed with the findings in the report. VAS [place] LOWER EXTREMITY VENOUS (DVT) Result Date: 06/18/2023  Lower Venous DVT Study Patient Name:  MATTISON STUCKEY  Date of Exam:   06/18/2023 Medical Rec #: [id number]         Accession #:    [id number] Date of Birth: [birth year]        Patient Gender: M Patient Age:   78 years Exam Location:  [facility] Procedure:      VAS [place] LOWER EXTREMITY VENOUS (DVT) Referring Phys: Quinterrius Errington --------------------------------------------------------------------------------  Indications: Pain.  Risk Factors: Multiple myeloma, chemotherapy. Comparison Study: No previous exams Performing Technologist: Jody Hill RVT, RDMS   Examination Guidelines: A complete evaluation includes B-mode imaging, spectral Doppler, color Doppler, and power Doppler as needed of all accessible portions of each vessel. Bilateral testing is considered an integral part of a complete examination. Limited examinations for reoccurring indications may be performed as noted. The reflux portion of the exam is performed with the patient in reverse Trendelenburg.  +---------+---------------+---------+-----------+----------+--------------+ RIGHT    CompressibilityPhasicitySpontaneityPropertiesThrombus Aging +---------+---------------+---------+-----------+----------+--------------+ CFV  Full           Yes      Yes                                 +---------+---------------+---------+-----------+----------+--------------+ SFJ      Full                                                        +---------+---------------+---------+-----------+----------+--------------+ FV Prox  Full           Yes      Yes                                 +---------+---------------+---------+-----------+----------+--------------+ FV Mid   Full           Yes      Yes                                 +---------+---------------+---------+-----------+----------+--------------+ FV DistalFull           Yes      Yes                                 +---------+---------------+---------+-----------+----------+--------------+ PFV      Full                                                        +---------+---------------+---------+-----------+----------+--------------+ POP      Full           Yes      Yes                                 +---------+---------------+---------+-----------+----------+--------------+ PTV      Full                                                        +---------+---------------+---------+-----------+----------+--------------+ PERO     Full                                                         +---------+---------------+---------+-----------+----------+--------------+   +----+---------------+---------+-----------+----------+--------------+ LEFTCompressibilityPhasicitySpontaneityPropertiesThrombus Aging +----+---------------+---------+-----------+----------+--------------+ CFV Full           Yes      Yes                                 +----+---------------+---------+-----------+----------+--------------+     Summary: RIGHT: - There is no evidence of deep vein thrombosis in the lower extremity.  -  No cystic structure found in the popliteal fossa.  LEFT: - No evidence of common femoral vein obstruction.   *See table(s) above for measurements and observations. Electronically signed by Sherald Hess MD on 06/18/2023 at 1:49:06 PM.    Final

## 2023-06-28 NOTE — Assessment & Plan Note (Signed)
Venous Doppler ultrasound is negative for DVT His physical examination is most consistent with possible muscular strain I recommend conservative approach with over-the-counter remedies such as heat pad, topical lidocaine and he has pain medicine to take as needed

## 2023-06-28 NOTE — Assessment & Plan Note (Signed)
This has resolved.  I gave the patient verbal and written instruction to reduce dexamethasone and lasix We would defer Zometa until he is able to get dental clearance

## 2023-06-28 NOTE — Assessment & Plan Note (Signed)
He tolerated IVIG well He will continue IVIG infusion monthly

## 2023-06-28 NOTE — Assessment & Plan Note (Signed)
Recent myeloma panel showed excellent partial response to treatment after only 2 doses of chemotherapy We will proceed with treatment as scheduled and continue myeloma monitoring once a month I recommend we delay dental clearance for Zometa due to high-dose dexamethasone with concern for wound healing and persistent anemia He will continue with vitamin D supplement and acyclovir for antimicrobial prophylaxis He will continue on aspirin for DVT prophylaxis and I recommend higher dose of aspirin for now

## 2023-06-29 DIAGNOSIS — M79604 Pain in right leg: Secondary | ICD-10-CM | POA: Diagnosis not present

## 2023-06-30 LAB — MULTIPLE MYELOMA PANEL, SERUM
Albumin SerPl Elph-Mcnc: 3 g/dL (ref 2.9–4.4)
Albumin/Glob SerPl: 0.8 (ref 0.7–1.7)
Alpha 1: 0.3 g/dL (ref 0.0–0.4)
Alpha2 Glob SerPl Elph-Mcnc: 0.8 g/dL (ref 0.4–1.0)
B-Globulin SerPl Elph-Mcnc: 0.8 g/dL (ref 0.7–1.3)
Gamma Glob SerPl Elph-Mcnc: 2.2 g/dL — ABNORMAL HIGH (ref 0.4–1.8)
Globulin, Total: 4.2 g/dL — ABNORMAL HIGH (ref 2.2–3.9)
IgA: 1743 mg/dL — ABNORMAL HIGH (ref 61–437)
IgG (Immunoglobin G), Serum: 691 mg/dL (ref 603–1613)
IgM (Immunoglobulin M), Srm: 20 mg/dL (ref 15–143)
M Protein SerPl Elph-Mcnc: 1.5 g/dL — ABNORMAL HIGH
Total Protein ELP: 7.2 g/dL (ref 6.0–8.5)

## 2023-07-01 ENCOUNTER — Telehealth: Payer: Self-pay

## 2023-07-01 NOTE — Telephone Encounter (Signed)
Chest pain is a serious condition EKG may not rule out MI; I recommend [facility]

## 2023-07-01 NOTE — Telephone Encounter (Signed)
Returned call to grandson, [name]. Rosie complained of shortness of breath and severe chest pain earlier today. EMS came out and did EKG that was negative for MI. [name] decided that Jaquise should to not go the ER due to concern for flu, etc and everyone sick in the ER. 93% sat on 2 l n/c and temperature 100.3.  He thinks he may have had a panic attack. Denies injury/ fall. He seems to be doing better now per grandson. Instructed to go to the ER to be evaluated now and maybe go to [facility] ER. Leighton Ruff does not want to take Vi to the ER.  He is asking if he could be seen in [facility] clinic? Or what does Dr. Bertis Ruddy recommend.

## 2023-07-01 NOTE — Telephone Encounter (Signed)
Called grandson back. He verbalized understanding to Dr. Bertis Ruddy response.  [name] said that he will continue to monitor the situation and if needed they will go to the ER. He thinks that Chrissie Noa had panic attack and takes back the earlier message that it was severe chest pain. Instructed to take tylenol prn for fever. [name] verbalized understanding.

## 2023-07-02 ENCOUNTER — Inpatient Hospital Stay: Payer: Medicare Other

## 2023-07-02 ENCOUNTER — Inpatient Hospital Stay (HOSPITAL_BASED_OUTPATIENT_CLINIC_OR_DEPARTMENT_OTHER): Payer: Medicare Other | Admitting: Hematology and Oncology

## 2023-07-02 ENCOUNTER — Encounter: Payer: Self-pay | Admitting: Hematology and Oncology

## 2023-07-02 ENCOUNTER — Other Ambulatory Visit (HOSPITAL_COMMUNITY): Payer: Self-pay

## 2023-07-02 ENCOUNTER — Telehealth: Payer: Self-pay

## 2023-07-02 VITALS — BP 132/54 | HR 78 | Temp 98.3°F | Resp 18 | Ht 73.0 in | Wt 236.6 lb

## 2023-07-02 DIAGNOSIS — G893 Neoplasm related pain (acute) (chronic): Secondary | ICD-10-CM

## 2023-07-02 DIAGNOSIS — C9000 Multiple myeloma not having achieved remission: Secondary | ICD-10-CM | POA: Diagnosis not present

## 2023-07-02 DIAGNOSIS — R0601 Orthopnea: Secondary | ICD-10-CM | POA: Diagnosis not present

## 2023-07-02 DIAGNOSIS — Z7961 Long term (current) use of immunomodulator: Secondary | ICD-10-CM | POA: Diagnosis not present

## 2023-07-02 DIAGNOSIS — I2510 Atherosclerotic heart disease of native coronary artery without angina pectoris: Secondary | ICD-10-CM | POA: Diagnosis not present

## 2023-07-02 DIAGNOSIS — Z5112 Encounter for antineoplastic immunotherapy: Secondary | ICD-10-CM | POA: Diagnosis not present

## 2023-07-02 DIAGNOSIS — D638 Anemia in other chronic diseases classified elsewhere: Secondary | ICD-10-CM

## 2023-07-02 DIAGNOSIS — D801 Nonfamilial hypogammaglobulinemia: Secondary | ICD-10-CM | POA: Diagnosis not present

## 2023-07-02 DIAGNOSIS — Z79899 Other long term (current) drug therapy: Secondary | ICD-10-CM | POA: Diagnosis not present

## 2023-07-02 DIAGNOSIS — M79604 Pain in right leg: Secondary | ICD-10-CM | POA: Diagnosis not present

## 2023-07-02 DIAGNOSIS — D539 Nutritional anemia, unspecified: Secondary | ICD-10-CM | POA: Diagnosis not present

## 2023-07-02 LAB — CMP (CANCER CENTER ONLY)
ALT: 16 U/L (ref 0–44)
AST: 11 U/L — ABNORMAL LOW (ref 15–41)
Albumin: 3.2 g/dL — ABNORMAL LOW (ref 3.5–5.0)
Alkaline Phosphatase: 129 U/L — ABNORMAL HIGH (ref 38–126)
Anion gap: 7 (ref 5–15)
BUN: 30 mg/dL — ABNORMAL HIGH (ref 8–23)
CO2: 26 mmol/L (ref 22–32)
Calcium: 9.7 mg/dL (ref 8.9–10.3)
Chloride: 104 mmol/L (ref 98–111)
Creatinine: 0.75 mg/dL (ref 0.61–1.24)
GFR, Estimated: >60 mL/min (ref >60)
Glucose, Bld: 174 mg/dL — ABNORMAL HIGH (ref 70–99)
Potassium: 3.9 mmol/L (ref 3.5–5.1)
Sodium: 137 mmol/L (ref 135–145)
Total Bilirubin: 0.5 mg/dL (ref 0.0–1.2)
Total Protein: 7.4 g/dL (ref 6.5–8.1)

## 2023-07-02 LAB — CBC WITH DIFFERENTIAL (CANCER CENTER ONLY)
Abs Immature Granulocytes: 0.03 10*3/uL (ref 0.00–0.07)
Basophils Absolute: 0 10*3/uL (ref 0.0–0.1)
Basophils Relative: 1 %
Eosinophils Absolute: 0.1 10*3/uL (ref 0.0–0.5)
Eosinophils Relative: 1 %
HCT: 27 % — ABNORMAL LOW (ref 39.0–52.0)
Hemoglobin: 8.5 g/dL — ABNORMAL LOW (ref 13.0–17.0)
Immature Granulocytes: 1 %
Lymphocytes Relative: 6 %
Lymphs Abs: 0.4 10*3/uL — ABNORMAL LOW (ref 0.7–4.0)
MCH: 28.5 pg (ref 26.0–34.0)
MCHC: 31.5 g/dL (ref 30.0–36.0)
MCV: 90.6 fL (ref 80.0–100.0)
Monocytes Absolute: 0.2 10*3/uL (ref 0.1–1.0)
Monocytes Relative: 3 %
Neutro Abs: 5.7 10*3/uL (ref 1.7–7.7)
Neutrophils Relative %: 88 %
Platelet Count: 198 10*3/uL (ref 150–400)
RBC: 2.98 MIL/uL — ABNORMAL LOW (ref 4.22–5.81)
RDW: 18.6 % — ABNORMAL HIGH (ref 11.5–15.5)
WBC Count: 6.4 10*3/uL (ref 4.0–10.5)
nRBC: 0 % (ref 0.0–0.2)

## 2023-07-02 LAB — BRAIN NATRIURETIC PEPTIDE: B Natriuretic Peptide: 171.9 pg/mL — ABNORMAL HIGH (ref 0.0–100.0)

## 2023-07-02 MED ORDER — DIPHENHYDRAMINE HCL 25 MG PO CAPS
25.0000 mg | ORAL_CAPSULE | Freq: Once | ORAL | Status: AC
  Administered 2023-07-02: 25 mg via ORAL
  Filled 2023-07-02: qty 1

## 2023-07-02 MED ORDER — ACETAMINOPHEN 325 MG PO TABS
650.0000 mg | ORAL_TABLET | Freq: Once | ORAL | Status: AC
  Administered 2023-07-02: 650 mg via ORAL
  Filled 2023-07-02: qty 2

## 2023-07-02 MED ORDER — OXYCODONE HCL 10 MG PO TABS
10.0000 mg | ORAL_TABLET | ORAL | 0 refills | Status: DC | PRN
  Filled 2023-07-02: qty 90, 15d supply, fill #0

## 2023-07-02 MED ORDER — DEXAMETHASONE 4 MG PO TABS: ORAL_TABLET | ORAL | Status: DC

## 2023-07-02 MED ORDER — FUROSEMIDE 40 MG PO TABS
40.0000 mg | ORAL_TABLET | Freq: Every day | ORAL | 1 refills | Status: DC
  Filled 2023-07-02: qty 60, 60d supply, fill #0

## 2023-07-02 MED ORDER — FUROSEMIDE 40 MG PO TABS: 40.0000 mg | ORAL_TABLET | Freq: Every day | ORAL | Status: DC

## 2023-07-02 MED ORDER — DARATUMUMAB-HYALURONIDASE-FIHJ 1800-30000 MG-UT/15ML ~~LOC~~ SOLN
1800.0000 mg | Freq: Once | SUBCUTANEOUS | Status: AC
  Administered 2023-07-02: 1800 mg via SUBCUTANEOUS
  Filled 2023-07-02: qty 15

## 2023-07-02 MED ORDER — BORTEZOMIB CHEMO SQ INJECTION 3.5 MG (2.5MG/ML)
1.0400 mg/m2 | Freq: Once | INTRAMUSCULAR | Status: AC
  Administered 2023-07-02: 2.5 mg via SUBCUTANEOUS
  Filled 2023-07-02: qty 1

## 2023-07-02 MED ORDER — FUROSEMIDE 20 MG PO TABS
40.0000 mg | ORAL_TABLET | Freq: Once | ORAL | Status: AC
  Administered 2023-07-02: 40 mg via ORAL
  Filled 2023-07-02: qty 2

## 2023-07-02 NOTE — Assessment & Plan Note (Signed)
I have reviewed recent myeloma panel with the patient and family He continues to have positive response to therapy We will continue treatment as prescribed I plan to prescribe further dexamethasone taper He will only take 2 mg on Mondays and Wednesday and then 20 mg every Friday He will continue acyclovir for antimicrobial prophylaxis He will continue aspirin for DVT prophylaxis We would discontinue allopurinol He will not get Zometa yet until he get dental clearance

## 2023-07-02 NOTE — Progress Notes (Signed)
Patient took his steroid at 9am.

## 2023-07-02 NOTE — Patient Instructions (Signed)
 [facility] - A DEPT OF [facility]  Discharge Instructions: Thank you for choosing [facility] to provide your oncology and hematology care.   If you have a lab appointment with the Cancer Center, please go directly to the Cancer Center and check in at the registration area.   Wear comfortable clothing and clothing appropriate for easy access to any Portacath or PICC line.   We strive to give you quality time with your provider. You may need to reschedule your appointment if you arrive late (15 or more minutes).  Arriving late affects you and other patients whose appointments are after yours.  Also, if you miss three or more appointments without notifying the office, you may be dismissed from the clinic at the provider's discretion.      For prescription refill requests, have your pharmacy contact our office and allow 72 hours for refills to be completed.    Today you received the following chemotherapy and/or immunotherapy agents: Velcade/Darzalex Faspro      To help prevent nausea and vomiting after your treatment, we encourage you to take your nausea medication as directed.  BELOW ARE SYMPTOMS THAT SHOULD BE REPORTED IMMEDIATELY: *FEVER GREATER THAN 100.4 F (38 C) OR HIGHER *CHILLS OR SWEATING *NAUSEA AND VOMITING THAT IS NOT CONTROLLED WITH YOUR NAUSEA MEDICATION *UNUSUAL SHORTNESS OF BREATH *UNUSUAL BRUISING OR BLEEDING *URINARY PROBLEMS (pain or burning when urinating, or frequent urination) *BOWEL PROBLEMS (unusual diarrhea, constipation, pain near the anus) TENDERNESS IN MOUTH AND THROAT WITH OR WITHOUT PRESENCE OF ULCERS (sore throat, sores in mouth, or a toothache) UNUSUAL RASH, SWELLING OR PAIN  UNUSUAL VAGINAL DISCHARGE OR ITCHING   Items with * indicate a potential emergency and should be followed up as soon as possible or go to the Emergency Department if any problems should occur.  Please show the CHEMOTHERAPY ALERT CARD or  IMMUNOTHERAPY ALERT CARD at check-in to the Emergency Department and triage nurse.  Should you have questions after your visit or need to cancel or reschedule your appointment, please contact [facility] - A DEPT OF [facility]  Dept: [phone]  and follow the prompts.  Office hours are 8:00 a.m. to 4:30 p.m. Monday - Friday. Please note that voicemails left after 4:00 p.m. may not be returned until the following business day.  We are closed weekends and major holidays. You have access to a nurse at all times for urgent questions. Please call the main number to the clinic Dept: [phone] and follow the prompts.   For any non-urgent questions, you may also contact your provider using MyChart. We now offer e-Visits for anyone 25 and older to request care online for non-urgent symptoms. For details visit [email or web address].   Also download the MyChart app! Go to the app store, search "MyChart", open the app, select [place], and log in with your MyChart username and password.

## 2023-07-02 NOTE — Progress Notes (Signed)
[facility] OFFICE PROGRESS NOTE  Patient Care Team: [name], Daniel as PCP - General (Family Medicine) [name], Daniel as PCP - Cardiology (Cardiology)  ASSESSMENT & PLAN:  Multiple myeloma without remission [facility]) I have reviewed recent myeloma panel with the patient and family He continues to have positive response to therapy We will continue treatment as prescribed I plan to prescribe further dexamethasone taper He will only take 2 mg on Mondays and Wednesday and then 20 mg every Friday He will continue acyclovir for antimicrobial prophylaxis He will continue aspirin for DVT prophylaxis We would discontinue allopurinol He will not get Zometa yet until he get dental clearance  Anemia, chronic disease This is likely anemia of chronic disease and from recent renal failure and chemotherapy. The patient denies recent history of bleeding such as epistaxis, hematuria or hematochezia. He is asymptomatic from the anemia. We will observe for now.   Cancer associated pain His rib pain has improved His right leg pain is stable He will continue to take oxycodone as needed  Orthopnea He has significant orthopnea and recent hypoxemia Examination revealed bilateral crackles I suspect the patient had fluid overload I draw BNP level that came back elevated His symptoms improved with oxygen supplementation as well as sleeping on an recliner I reviewed his prior cardiac catheterization report and recent cardiology visit I suspect recent high doses of dexamethasone might have caused fluid retention and recent furosemide taper might have increase his chances of fluid overload I recommend additional doses of furosemide today and increase his furosemide over the weekend I will reach out to his cardiologist to see if we can get him an appointment for further follow-up  Orders Placed This Encounter  Procedures   BNP (Brain natriuretic peptide)    Standing Status:   Future     Number of Occurrences:   1    Expected Date:   07/02/2023    Expiration Date:   07/01/2024    All questions were answered. The patient knows to call the clinic with any problems, questions or concerns. The total time spent in the appointment was 40 minutes encounter with patients including review of chart and various tests results, discussions about plan of care and coordination of care plan   [name], Daniel 07/02/2023 3:07 PM  INTERVAL HISTORY: Please see below for problem oriented charting. he returns for chemo follow-up He is here accompanied by his grandson and granddaughter Burgess Whitaker, his grandson was concerned The patient has been having difficulties with breathing He has orthopnea and has been sleeping on recliner He was having such difficulties with breathing and they called EMS EKG was normal and the patient declined to be brought into ER for further evaluation He denies chest pain today He took nitroglycerin yesterday but it did not help With supplemental oxygen, he felt much better His leg pain is stable.  He has not been moving around as much because of this No recent fever or chills  REVIEW OF SYSTEMS:   Constitutional: Denies fevers, chills or abnormal weight loss Eyes: Denies blurriness of vision Ears, nose, mouth, throat, and face: Denies mucositis or sore throat Gastrointestinal:  Denies nausea, heartburn or change in bowel habits Skin: Denies abnormal skin rashes Lymphatics: Denies new lymphadenopathy or easy bruising Behavioral/Psych: Mood is stable, no new changes  All other systems were reviewed with the patient and are negative.  I have reviewed the past medical history, past surgical history, social history and family history with the  patient and they are unchanged from previous note.  ALLERGIES:  is allergic to atorvastatin, cymbalta [duloxetine hcl], and penicillins.  MEDICATIONS:  Current Outpatient Medications  Medication Sig Dispense Refill    acyclovir (ZOVIRAX) 400 MG tablet Take 1 tablet (400 mg total) by mouth daily. 30 tablet 6   albuterol (VENTOLIN HFA) 108 (90 Base) MCG/ACT inhaler Inhale 1-2 puffs into the lungs every 6 (six) hours as needed for wheezing or shortness of breath.     aspirin EC 325 MG tablet Take 1 tablet (325 mg total) by mouth daily.     brimonidine (ALPHAGAN) 0.2 % ophthalmic solution Place 1 drop into the left eye in the morning and at bedtime.     carvedilol (COREG) 3.125 MG tablet TAKE 1 TABLET BY MOUTH TWICE DAILY 180 tablet 3   cetirizine (ZYRTEC) 10 MG tablet Take 10 mg by mouth every morning.     Cholecalciferol (VITAMIN D3) 50 MCG (2000 UT) TABS Take 2,000 Units by mouth daily.     cyclobenzaprine (FLEXERIL) 10 MG tablet Take 10 mg by mouth 3 (three) times daily as needed for muscle spasms.     dexamethasone (DECADRON) 4 MG tablet Take 1/2 pill daily on Mondays and Wednesdays; take 5 pills once a week on Fridays.     docusate sodium (COLACE) 100 MG capsule Take 100-200 mg by mouth 2 (two) times daily as needed for mild constipation.     dorzolamide-timolol (COSOPT) 22.3-6.8 MG/ML ophthalmic solution Place 1 drop into both eyes 2 (two) times daily.     furosemide (LASIX) 40 MG tablet Take 1 tablet (40 mg total) by mouth daily. 60 tablet 1   ketoconazole (NIZORAL) 2 % cream Apply 1 application  topically daily as needed for irritation.     latanoprost (XALATAN) 0.005 % ophthalmic solution Place 1 drop into both eyes at bedtime.     lenalidomide (REVLIMID) 10 MG capsule Take 1 capsule (10 mg total) by mouth daily. Take 1 capsule daily for 14 days on and 7 days off, repeat every 21 days. [employer] Auth # [id number]   Date Obtained 06/16/2023 14 capsule 0   lidocaine (XYLOCAINE) 5 % ointment Apply 1 Application topically 2 (two) times daily as needed. (Patient taking differently: Apply 1 Application topically 2 (two) times daily as needed (for foot pain).) 35.44 g 1   magnesium oxide (MAG-OX) 400 (240 Mg) MG tablet  Take 400 mg by mouth daily.     Multiple Vitamins-Minerals (CENTRUM SILVER 50+MEN) TABS Take 1 tablet by mouth daily with breakfast.     nitroGLYCERIN (NITROSTAT) 0.4 MG SL tablet Place 1 tablet (0.4 mg total) under the tongue every 5 (five) minutes as needed. (Patient taking differently: Place 0.4 mg under the tongue every 5 (five) minutes as needed for chest pain.) 25 tablet 2   Omega-3 Fatty Acids (FISH OIL) 1200 MG CAPS Take 3,600 mg by mouth daily. 3     ondansetron (ZOFRAN) 8 MG tablet Take 1 tablet (8 mg total) by mouth every 8 (eight) hours as needed for nausea or vomiting. 30 tablet 1   Oxycodone HCl 10 MG TABS Take 1 tablet (10 mg total) by mouth every 4 (four) hours as needed. 90 tablet 0   pantoprazole (PROTONIX) 40 MG tablet Take 40 mg by mouth daily before breakfast.     polyethylene glycol (MIRALAX / GLYCOLAX) 17 g packet Take 17 g by mouth daily.     pregabalin (LYRICA) 100 MG capsule TAKE 1 CAPSULE BY MOUTH  IN THE MORNING, AND AT NOON, AND 2 CAPSULES AT NIGHT (Patient taking differently: Take 100 mg by mouth in the morning and at bedtime.) 120 capsule 5   prochlorperazine (COMPAZINE) 10 MG tablet Take 1 tablet (10 mg total) by mouth every 6 (six) hours as needed for nausea or vomiting. 30 tablet 1   rosuvastatin (CRESTOR) 40 MG tablet TAKE 1 TABLET BY MOUTH DAILY 90 tablet 0   senna-docusate (SENOKOT-S) 8.6-50 MG tablet Take 1 tablet by mouth 2 (two) times daily.     triamcinolone (NASACORT) 55 MCG/ACT AERO nasal inhaler Place 2 sprays into the nose daily. 1 each 12   TYLENOL 500 MG tablet Take 500-1,000 mg by mouth every 6 (six) hours as needed for mild pain (pain score 1-3) or headache.     No current facility-administered medications for this visit.    SUMMARY OF ONCOLOGIC HISTORY: Oncology History Overview Note  Myeloma FISH was neg Normal cytogenetics   Multiple myeloma without remission (HCC)  07/11/2013 Initial Diagnosis   Multiple myeloma without remission (HCC)    04/22/2023 PET scan   NM PET Image Initial (PI) Whole Body (F-18 FDG)  Result Date: 05/03/2023 CLINICAL DATA:  Subsequent treatment strategy for multiple myeloma. EXAM: NUCLEAR MEDICINE PET WHOLE BODY TECHNIQUE: 13 mCi F-18 FDG was injected intravenously. Full-ring PET imaging was performed from the head to foot after the radiotracer. CT data was obtained and used for attenuation correction and anatomic localization. Fasting blood glucose: 114 mg/dl COMPARISON:  None Available. FINDINGS: Mediastinal blood pool activity: SUV max 2.0 HEAD/NECK: No hypermetabolic activity in the scalp. No hypermetabolic cervical lymph nodes. Incidental CT findings: Is CHEST: No hypermetabolic mediastinal or hilar nodes. No suspicious pulmonary nodules on the CT scan. Incidental CT findings: none ABDOMEN/PELVIS: No abnormal hypermetabolic activity within the liver, pancreas, adrenal glands, or spleen. No hypermetabolic lymph nodes in the abdomen or pelvis. Incidental CT findings: none SKELETON: There multiple foci focal radiotracer activity within the skeleton. Lesions are accompaniedlytic change on CT portion exam. There approximately 50 lesions. For example lesion the posterior RIGHT fifth rib with SUV max equal 3.6 on image 120. There is a lucent lesion and pathologic fracture associated with hypermetabolic activity the posterior LEFT sixth rib with SUV max equal 6.4 on image 129. Multiple lesions throughout the spine. Most intense lesion spinal lesion has associated soft tissue expansion at the the T2 vertebral body measuring 2.5 cm (107) with SUV max equal 7.3 Lesion in the inferior LEFT pubic rami subtle lucency on image 299. Suspicious lesions in LEFT and RIGHT femoral necks. Incidental CT findings: none EXTREMITIES: Hypermetabolic lesions RIGHT humeral shaft. Lesion the proximal LEFT and RIGHT femurs. Incidental CT findings: none IMPRESSION: 1. Multiple hypermetabolic lytic lesions throughout the skeleton consistent with  multiple myeloma. Approximately 50 lesions within the axillary and appendicular skeleton. Many lesions accompanied lytic/lucent lesions on CT. 2. No evidence of soft tissue plasmacytoma. Electronically Signed   By: Genevive Bi M.D.   On: 05/03/2023 15:16   CT Chest W Contrast  Result Date: 05/03/2023 CLINICAL DATA:  Pneumonia with complication suspected. X-ray done. Cough with rib pain after coughing. Shortness of breath and right-sided pain. History of multiple myeloma. EXAM: CT CHEST WITH CONTRAST TECHNIQUE: Multidetector CT imaging of the chest was performed during intravenous contrast administration. RADIATION DOSE REDUCTION: This exam was performed according to the departmental dose-optimization program which includes automated exposure control, adjustment of the mA and/or kV according to patient size and/or use of iterative reconstruction technique.  CONTRAST:  60mL OMNIPAQUE IOHEXOL 300 MG/ML  SOLN COMPARISON:  Chest radiograph 05/02/2023.  PET-CT 04/22/2023 FINDINGS: Cardiovascular: Mild cardiac enlargement. No pericardial effusions. Normal caliber thoracic aorta. Calcification of the aorta and coronary arteries. Mediastinum/Nodes: Thyroid gland is unremarkable. Esophagus is decompressed. Scattered mediastinal lymph nodes are not pathologically enlarged. Lungs/Pleura: Motion artifact limits examination. There is evidence of atelectasis or consolidation in both lung bases. Small bilateral pleural effusions. Changes could represent pneumonia. No pneumothorax. Upper Abdomen: No acute abnormalities. Musculoskeletal: Degenerative changes in the spine and shoulders. Multiple lucent lesions demonstrated in the ribs, spine, and shoulders consistent with history of myeloma. Fractures of the right lateral fifth, sixth, and seventh ribs as well as the right tenth rib. Fractures of the left third, fourth, and sixth ribs. No vertebral compression deformities are demonstrated although large lucent lesions are  demonstrated in the T1 and T2 vertebrae. IMPRESSION: 1. Multiple lucent lesions demonstrated in the ribs, spine, and shoulders corresponding to the patient's history of multiple myeloma. 2. Multiple bilateral acute rib fractures. 3. Small bilateral pleural effusions with basilar atelectasis or consolidation, possibly pneumonia. Electronically Signed   By: Burman Nieves M.D.   On: 05/03/2023 01:21   DG Chest Port 1 View  Result Date: 05/02/2023 CLINICAL DATA:  Shortness of breath and cough. History of multiple myeloma. Right-sided pain. EXAM: PORTABLE CHEST 1 VIEW COMPARISON:  04/20/2023 FINDINGS: Shallow inspiration. Cardiac enlargement. No vascular congestion or edema. Linear atelectasis in the lung bases. No pleural effusions. No pneumothorax. Calcification of the aorta. A nondisplaced right lateral ninth rib fracture is suggested. IMPRESSION: Shallow inspiration with atelectasis in the lung bases. Cardiac enlargement. Probable right ninth rib fracture. Electronically Signed   By: Burman Nieves M.D.   On: 05/02/2023 23:45   CT BONE MARROW BIOPSY & ASPIRATION  Result Date: 04/27/2023 INDICATION: myeloma staging EXAM: CT GUIDED BONE MARROW ASPIRATION AND CORE BIOPSY MEDICATIONS: None. ANESTHESIA/SEDATION: Moderate (conscious) sedation was employed during this procedure. A total of Versed 2 mg and Fentanyl 100 mcg was administered intravenously. Moderate Sedation Time: 13 minutes. The patient's level of consciousness and vital signs were monitored continuously by radiology nursing throughout the procedure under my direct supervision. FLUOROSCOPY TIME:  CT dose; 122 mGycm COMPLICATIONS: None immediate. Estimated blood loss: <5 mL PROCEDURE: RADIATION DOSE REDUCTION: This exam was performed according to the departmental dose-optimization program which includes automated exposure control, adjustment of the mA and/or kV according to patient size and/or use of iterative reconstruction technique. Informed  written consent was obtained from the patient after a thorough discussion of the procedural risks, benefits and alternatives. All questions were addressed. Maximal Sterile Barrier Technique was utilized including caps, mask, sterile gowns, sterile gloves, sterile drape, hand hygiene and skin antiseptic. A timeout was performed prior to the initiation of the procedure. The patient was positioned prone and non-contrast localization CT was performed of the pelvis to demonstrate the iliac marrow spaces. Maximal barrier sterile technique utilized including caps, mask, sterile gowns, sterile gloves, large sterile drape, hand hygiene, and chlorhexidine prep. Under sterile conditions and local anesthesia, an 11 gauge coaxial bone biopsy needle was advanced into the RIGHT iliac marrow space. Needle position was confirmed with CT imaging. Initially, bone marrow aspiration was performed. Next, the 11 gauge outer cannula was utilized to obtain a 1 iliac bone marrow core biopsy. Needle was removed. Hemostasis was obtained with compression. The patient tolerated the procedure well. Samples were prepared with the cytotechnologist. IMPRESSION: Successful CT-guided bone marrow aspiration and biopsy. Roanna Banning,  [facility] Radiology Electronically Signed   By: Roanna Banning M.D.   On: 04/27/2023 12:32   DG Chest 2 View  Result Date: 04/23/2023 CLINICAL DATA:  Cough for 2 months Pain between shoulder blades EXAM: CHEST - 2 VIEW COMPARISON:  08/24/2019 FINDINGS: Cardiomediastinal silhouette and pulmonary vasculature are within normal limits. Lungs are hyperexpanded but otherwise clear. Unchanged dextroconvex curvature of the thoracolumbar spine. IMPRESSION: 1. No acute cardiopulmonary process. 2. Hyperexpanded lungs, suspicious for emphysema. Electronically Signed   By: Acquanetta Belling M.D.   On: 04/23/2023 16:44      04/27/2023 Pathology Results   Surgical Pathology CASE:  [id number] PATIENT: Stephanie Coup Bone Marrow Report  Clinical History: Myeloma  DIAGNOSIS:  BONE MARROW, ASPIRATE, CLOT, CORE: -Hypercellular bone marrow with plasma cell neoplasm -See comment  PERIPHERAL BLOOD: -Normocytic-normochromic anemia -Leukocytosis with neutrophilic left shift  COMMENT:  The bone marrow is hypercellular for age with increased number of plasma cells averaging 15% of all cells in the aspirate.  The clot and biopsy sections show interstitial infiltrates, numerous variably sized clusters in addition to an area of diffuse sheet of atypical plasma cells associated with kappa light chain restriction consistent with plasma cell neoplasm.  Correlation with cytogenetic and FISH studies is recommended.  MICROSCOPIC DESCRIPTION:  PERIPHERAL BLOOD SMEAR: The red blood cells display mild anisopoikilocytosis with mild to moderate polychromasia.  The white blood cells are slightly increased in number, mostly with neutrophils some of which display toxic granulation.  An occasional myelocyte is seen on scan.  The platelets are normal in number.  BONE MARROW ASPIRATE: Bone marrow particles present Erythroid precursors: Orderly and progressive maturation Granulocytic precursors: Orderly and progressive maturation for the most part Megakaryocytes: Abundant with scattered small and/or hypolobated forms or large cells. Lymphocytes/plasma cells: The plasma cells are increased in number representing 15% to all cells associated with atypical cytologic features characterized by cytomegaly and/or small nucleoli.  Large lymphoid aggregates are not seen.  TOUCH PREPARATIONS: A mixture of cell types present but with increased number of plasma cells  CLOT AND BIOPSY: The sections show variable cellularity ranging from 40 to 90% focally.  There are numerous variably sized clusters of atypical plasma cells in addition to a diffuse large sheet particularly in the area of most cellularity.   The background shows trilineage hematopoiesis.  Immunohistochemical stains for CD138 in addition to in situ hybridization for kappa and lambda were performed with appropriate controls.  CD138 highlights the increased plasma cell component consisting of interstitial infiltrates, numerous variably sized clusters, and an area of diffuse sheet particularly in the core biopsy. The plasma cells display kappa light chain restriction.  IRON STAIN: Iron stains are performed on a bone marrow aspirate or touch imprint smear and section of clot. The controls stained appropriately.       Storage Iron: Decreased      Ring Sideroblasts: Absent  ADDITIONAL DATA/TESTING: The specimen was sent for cytogenetic analysis and FISH for multiple myeloma and a separate report will follow.    05/04/2023 Cancer Staging   Staging form: Plasma Cell Myeloma and Plasma Cell Disorders, AJCC 8th Edition - Clinical stage from 05/04/2023: RISS Stage II (Beta-2-microglobulin (mg/L): 5.6, Albumin (g/dL): 2.5, ISS: Stage III, High-risk cytogenetics: Absent, LDH: Normal) - Signed by [name], Daniel on 05/11/2023 Stage prefix: Initial diagnosis Beta 2 microglobulin range (mg/L): Greater than or equal to 5.5 Albumin range (g/dL): Less than 3.5 Cytogenetics: No abnormalities   05/14/2023 -  Chemotherapy   Patient is on Treatment Plan : MYELOMA NEWLY DIAGNOSED TRANSPLANT CANDIDATE DaraVRd (Daratumumab SQ) q21d x 6 Cycles (Induction/Consolidation)     06/18/2023 Imaging   RIGHT:         - There is no evidence of deep vein thrombosis in the lower extremity.   - No cystic structure found in the popliteal fossa.   LEFT: - No evidence of common femoral vein obstruction.         PHYSICAL EXAMINATION: ECOG PERFORMANCE STATUS: 2 - Symptomatic, <50% confined to bed  Vitals:   07/02/23 1312  BP: (!) 132/54  Pulse: 78  Resp: 18  Temp: 98.3 F (36.8 C)  SpO2: 95%   Filed Weights   07/02/23 1312  Weight: 236 lb 9.6 oz  (107.3 kg)    GENERAL:alert, no distress and comfortable SKIN: skin color, texture, turgor are normal, no rashes or significant lesions EYES: normal, Conjunctiva are pink and non-injected, sclera clear OROPHARYNX:no exudate, no erythema and lips, buccal mucosa, and tongue normal  NECK: supple, thyroid normal size, non-tender, without nodularity LYMPH:  no palpable lymphadenopathy in the cervical, axillary or inguinal LUNGS: He has oxygen delivery via nasal cannula.  He has increased breathing effort.  Bilateral bibasilar crackles are noted  HEART: regular rate & rhythm and no murmurs with stable bilateral lower extremity edema ABDOMEN:abdomen soft, non-tender and normal bowel sounds Musculoskeletal:no cyanosis of digits and no clubbing  NEURO: alert & oriented x 3 with fluent speech, no focal motor/sensory deficits  LABORATORY DATA:  I have reviewed the data as listed    Component Value Date/Time   NA 137 07/02/2023 1252   NA 139 01/12/2017 1011   K 3.9 07/02/2023 1252   K 4.3 01/12/2017 1011   CL 104 07/02/2023 1252   CO2 26 07/02/2023 1252   CO2 23 01/12/2017 1011   GLUCOSE 174 (H) 07/02/2023 1252   GLUCOSE 98 01/12/2017 1011   BUN 30 (H) 07/02/2023 1252   BUN 21.8 01/12/2017 1011   CREATININE 0.75 07/02/2023 1252   CREATININE 0.9 01/12/2017 1011   CALCIUM 9.7 07/02/2023 1252   CALCIUM 9.0 01/12/2017 1011   PROT 7.4 07/02/2023 1252   PROT 6.4 01/12/2017 1011   PROT 6.1 01/12/2017 1011   ALBUMIN 3.2 (L) 07/02/2023 1252   ALBUMIN 3.6 01/12/2017 1011   AST 11 (L) 07/02/2023 1252   AST 20 01/12/2017 1011   ALT 16 07/02/2023 1252   ALT 18 01/12/2017 1011   ALKPHOS 129 (H) 07/02/2023 1252   ALKPHOS 81 01/12/2017 1011   BILITOT 0.5 07/02/2023 1252   BILITOT 0.58 01/12/2017 1011   GFRNONAA >60 07/02/2023 1252   GFRAA >60 02/14/2020 1115    No results found for: "SPEP", "UPEP"  Lab Results  Component Value Date   WBC 6.4 07/02/2023   NEUTROABS 5.7 07/02/2023   HGB  8.5 (L) 07/02/2023   HCT 27.0 (L) 07/02/2023   MCV 90.6 07/02/2023   PLT 198 07/02/2023      Chemistry      Component Value Date/Time   NA 137 07/02/2023 1252   NA 139 01/12/2017 1011   K 3.9 07/02/2023 1252   K 4.3 01/12/2017 1011   CL 104 07/02/2023 1252   CO2 26 07/02/2023 1252   CO2 23 01/12/2017 1011   BUN 30 (H) 07/02/2023 1252   BUN 21.8 01/12/2017 1011   CREATININE 0.75 07/02/2023 1252   CREATININE 0.9 01/12/2017 1011      Component Value Date/Time  CALCIUM 9.7 07/02/2023 1252   CALCIUM 9.0 01/12/2017 1011   ALKPHOS 129 (H) 07/02/2023 1252   ALKPHOS 81 01/12/2017 1011   AST 11 (L) 07/02/2023 1252   AST 20 01/12/2017 1011   ALT 16 07/02/2023 1252   ALT 18 01/12/2017 1011   BILITOT 0.5 07/02/2023 1252   BILITOT 0.58 01/12/2017 1011       RADIOGRAPHIC STUDIES: I have personally reviewed the radiological images as listed and agreed with the findings in the report. VAS [place] LOWER EXTREMITY VENOUS (DVT) Result Date: 06/18/2023  Lower Venous DVT Study Patient Name:  Daniel Whitaker  Date of Exam:   06/18/2023 Medical Rec #: [id number]         Accession #:    [id number] Date of Birth: [birth year]        Patient Gender: M Patient Age:   19 years Exam Location:  [facility] Procedure:      VAS [place] LOWER EXTREMITY VENOUS (DVT) Referring Phys: Zoriana Oats --------------------------------------------------------------------------------  Indications: Pain.  Risk Factors: Multiple myeloma, chemotherapy. Comparison Study: No previous exams Performing Technologist: Jody Hill RVT, RDMS  Examination Guidelines: A complete evaluation includes B-mode imaging, spectral Doppler, color Doppler, and power Doppler as needed of all accessible portions of each vessel. Bilateral testing is considered an integral part of a complete examination. Limited examinations for reoccurring indications may be performed as noted. The reflux portion of the exam is performed with the patient in reverse  Trendelenburg.  +---------+---------------+---------+-----------+----------+--------------+ RIGHT    CompressibilityPhasicitySpontaneityPropertiesThrombus Aging +---------+---------------+---------+-----------+----------+--------------+ CFV      Full           Yes      Yes                                 +---------+---------------+---------+-----------+----------+--------------+ SFJ      Full                                                        +---------+---------------+---------+-----------+----------+--------------+ FV Prox  Full           Yes      Yes                                 +---------+---------------+---------+-----------+----------+--------------+ FV Mid   Full           Yes      Yes                                 +---------+---------------+---------+-----------+----------+--------------+ FV DistalFull           Yes      Yes                                 +---------+---------------+---------+-----------+----------+--------------+ PFV      Full                                                        +---------+---------------+---------+-----------+----------+--------------+  POP      Full           Yes      Yes                                 +---------+---------------+---------+-----------+----------+--------------+ PTV      Full                                                        +---------+---------------+---------+-----------+----------+--------------+ PERO     Full                                                        +---------+---------------+---------+-----------+----------+--------------+   +----+---------------+---------+-----------+----------+--------------+ LEFTCompressibilityPhasicitySpontaneityPropertiesThrombus Aging +----+---------------+---------+-----------+----------+--------------+ CFV Full           Yes      Yes                                  +----+---------------+---------+-----------+----------+--------------+     Summary: RIGHT: - There is no evidence of deep vein thrombosis in the lower extremity.  - No cystic structure found in the popliteal fossa.  LEFT: - No evidence of common femoral vein obstruction.   *See table(s) above for measurements and observations. Electronically signed by Sherald Hess Daniel on 06/18/2023 at 1:49:06 PM.    Final

## 2023-07-02 NOTE — Assessment & Plan Note (Signed)
His rib pain has improved His right leg pain is stable He will continue to take oxycodone as needed

## 2023-07-02 NOTE — Assessment & Plan Note (Signed)
He has significant orthopnea and recent hypoxemia Examination revealed bilateral crackles I suspect the patient had fluid overload I draw BNP level that came back elevated His symptoms improved with oxygen supplementation as well as sleeping on an recliner I reviewed his prior cardiac catheterization report and recent cardiology visit I suspect recent high doses of dexamethasone might have caused fluid retention and recent furosemide taper might have increase his chances of fluid overload I recommend additional doses of furosemide today and increase his furosemide over the weekend I will reach out to his cardiologist to see if we can get him an appointment for further follow-up

## 2023-07-02 NOTE — Assessment & Plan Note (Signed)
 This is likely anemia of chronic disease and from recent renal failure and chemotherapy. The patient denies recent history of bleeding such as epistaxis, hematuria or hematochezia. He is asymptomatic from the anemia. We will observe for now.

## 2023-07-02 NOTE — Telephone Encounter (Signed)
Called and given grandson BNP results per Dr. Bertis Ruddy. He verbalized understanding.

## 2023-07-05 ENCOUNTER — Telehealth: Payer: Self-pay | Admitting: Cardiology

## 2023-07-05 NOTE — Telephone Encounter (Signed)
Dr Bertis Ruddy told patient to call and talk to nurse about his cancer treatment may be causing fluid around his heart.

## 2023-07-05 NOTE — Telephone Encounter (Signed)
Patient identification verified by 2 forms. Shade Flood, RN     Called and spoke to patient  Patient states:  - oncologist sent message regarding last appointment. Per Dr. Bertis Ruddy last OV note "I suspect recent high doses of dexamethasone might have caused fluid retention and recent furosemide taper might have increase his chances of fluid overload "   - concerned treatment may be causing fluid retention   - patient experiencing SOB, swelling around ankles, but no where else.   Patient denies: - chest pain, syncope  Interventions/Plan: - patient scheduled for office visit with APP on Monday.    Reviewed ED warning signs/precautions  Patient agrees with plan, no questions at this time

## 2023-07-06 DIAGNOSIS — M79604 Pain in right leg: Secondary | ICD-10-CM | POA: Diagnosis not present

## 2023-07-06 NOTE — Telephone Encounter (Signed)
Called patient and spoke with him about seeing him in clinic on this Friday instead but he states that Friday is his chemo day and he wouldn't be able to come to the clinic on that day. He will keep his appointment with the APP on Monday.

## 2023-07-08 DIAGNOSIS — H43391 Other vitreous opacities, right eye: Secondary | ICD-10-CM | POA: Diagnosis not present

## 2023-07-08 DIAGNOSIS — H26493 Other secondary cataract, bilateral: Secondary | ICD-10-CM | POA: Diagnosis not present

## 2023-07-08 DIAGNOSIS — H401123 Primary open-angle glaucoma, left eye, severe stage: Secondary | ICD-10-CM | POA: Diagnosis not present

## 2023-07-08 DIAGNOSIS — Z961 Presence of intraocular lens: Secondary | ICD-10-CM | POA: Diagnosis not present

## 2023-07-08 DIAGNOSIS — H1045 Other chronic allergic conjunctivitis: Secondary | ICD-10-CM | POA: Diagnosis not present

## 2023-07-08 DIAGNOSIS — H04123 Dry eye syndrome of bilateral lacrimal glands: Secondary | ICD-10-CM | POA: Diagnosis not present

## 2023-07-09 ENCOUNTER — Inpatient Hospital Stay (HOSPITAL_BASED_OUTPATIENT_CLINIC_OR_DEPARTMENT_OTHER): Payer: Medicare Other | Admitting: Hematology and Oncology

## 2023-07-09 ENCOUNTER — Inpatient Hospital Stay: Payer: Medicare Other

## 2023-07-09 ENCOUNTER — Other Ambulatory Visit: Payer: Self-pay

## 2023-07-09 ENCOUNTER — Ambulatory Visit: Payer: Medicare Other

## 2023-07-09 ENCOUNTER — Encounter: Payer: Self-pay | Admitting: Hematology and Oncology

## 2023-07-09 ENCOUNTER — Inpatient Hospital Stay: Payer: Medicare Other | Attending: Hematology and Oncology

## 2023-07-09 ENCOUNTER — Ambulatory Visit: Payer: Medicare Other | Admitting: Hematology and Oncology

## 2023-07-09 ENCOUNTER — Other Ambulatory Visit: Payer: Medicare Other

## 2023-07-09 VITALS — BP 147/65 | HR 80 | Temp 97.2°F | Resp 18 | Ht 73.0 in | Wt 226.7 lb

## 2023-07-09 DIAGNOSIS — Z79899 Other long term (current) drug therapy: Secondary | ICD-10-CM | POA: Diagnosis not present

## 2023-07-09 DIAGNOSIS — L89321 Pressure ulcer of left buttock, stage 1: Secondary | ICD-10-CM | POA: Diagnosis not present

## 2023-07-09 DIAGNOSIS — D539 Nutritional anemia, unspecified: Secondary | ICD-10-CM | POA: Insufficient documentation

## 2023-07-09 DIAGNOSIS — L89301 Pressure ulcer of unspecified buttock, stage 1: Secondary | ICD-10-CM | POA: Insufficient documentation

## 2023-07-09 DIAGNOSIS — D801 Nonfamilial hypogammaglobulinemia: Secondary | ICD-10-CM | POA: Diagnosis not present

## 2023-07-09 DIAGNOSIS — E8770 Fluid overload, unspecified: Secondary | ICD-10-CM | POA: Insufficient documentation

## 2023-07-09 DIAGNOSIS — G893 Neoplasm related pain (acute) (chronic): Secondary | ICD-10-CM | POA: Insufficient documentation

## 2023-07-09 DIAGNOSIS — C9000 Multiple myeloma not having achieved remission: Secondary | ICD-10-CM | POA: Diagnosis not present

## 2023-07-09 DIAGNOSIS — R0601 Orthopnea: Secondary | ICD-10-CM | POA: Diagnosis not present

## 2023-07-09 DIAGNOSIS — D61818 Other pancytopenia: Secondary | ICD-10-CM | POA: Insufficient documentation

## 2023-07-09 DIAGNOSIS — R0600 Dyspnea, unspecified: Secondary | ICD-10-CM | POA: Diagnosis not present

## 2023-07-09 DIAGNOSIS — Z5112 Encounter for antineoplastic immunotherapy: Secondary | ICD-10-CM | POA: Insufficient documentation

## 2023-07-09 DIAGNOSIS — D638 Anemia in other chronic diseases classified elsewhere: Secondary | ICD-10-CM

## 2023-07-09 LAB — CBC WITH DIFFERENTIAL (CANCER CENTER ONLY)
Abs Immature Granulocytes: 0.01 10*3/uL (ref 0.00–0.07)
Basophils Absolute: 0 10*3/uL (ref 0.0–0.1)
Basophils Relative: 1 %
Eosinophils Absolute: 0.1 10*3/uL (ref 0.0–0.5)
Eosinophils Relative: 4 %
HCT: 28 % — ABNORMAL LOW (ref 39.0–52.0)
Hemoglobin: 9 g/dL — ABNORMAL LOW (ref 13.0–17.0)
Immature Granulocytes: 1 %
Lymphocytes Relative: 12 %
Lymphs Abs: 0.3 10*3/uL — ABNORMAL LOW (ref 0.7–4.0)
MCH: 28.4 pg (ref 26.0–34.0)
MCHC: 32.1 g/dL (ref 30.0–36.0)
MCV: 88.3 fL (ref 80.0–100.0)
Monocytes Absolute: 0.1 10*3/uL (ref 0.1–1.0)
Monocytes Relative: 6 %
Neutro Abs: 1.6 10*3/uL — ABNORMAL LOW (ref 1.7–7.7)
Neutrophils Relative %: 76 %
Platelet Count: 238 10*3/uL (ref 150–400)
RBC: 3.17 MIL/uL — ABNORMAL LOW (ref 4.22–5.81)
RDW: 17.5 % — ABNORMAL HIGH (ref 11.5–15.5)
WBC Count: 2 10*3/uL — ABNORMAL LOW (ref 4.0–10.5)
nRBC: 0 % (ref 0.0–0.2)

## 2023-07-09 LAB — CMP (CANCER CENTER ONLY)
ALT: 14 U/L (ref 0–44)
AST: 12 U/L — ABNORMAL LOW (ref 15–41)
Albumin: 3.4 g/dL — ABNORMAL LOW (ref 3.5–5.0)
Alkaline Phosphatase: 123 U/L (ref 38–126)
Anion gap: 10 (ref 5–15)
BUN: 28 mg/dL — ABNORMAL HIGH (ref 8–23)
CO2: 27 mmol/L (ref 22–32)
Calcium: 9.1 mg/dL (ref 8.9–10.3)
Chloride: 100 mmol/L (ref 98–111)
Creatinine: 0.88 mg/dL (ref 0.61–1.24)
GFR, Estimated: >60 mL/min (ref >60)
Glucose, Bld: 157 mg/dL — ABNORMAL HIGH (ref 70–99)
Potassium: 3.4 mmol/L — ABNORMAL LOW (ref 3.5–5.1)
Sodium: 137 mmol/L (ref 135–145)
Total Bilirubin: 0.4 mg/dL (ref 0.0–1.2)
Total Protein: 7.3 g/dL (ref 6.5–8.1)

## 2023-07-09 LAB — SAMPLE TO BLOOD BANK

## 2023-07-09 MED ORDER — DARATUMUMAB-HYALURONIDASE-FIHJ 1800-30000 MG-UT/15ML ~~LOC~~ SOLN
1800.0000 mg | Freq: Once | SUBCUTANEOUS | Status: AC
  Administered 2023-07-09: 1800 mg via SUBCUTANEOUS
  Filled 2023-07-09: qty 15

## 2023-07-09 MED ORDER — DEXAMETHASONE 4 MG PO TABS: ORAL_TABLET | ORAL | Status: DC

## 2023-07-09 MED ORDER — LENALIDOMIDE 10 MG PO CAPS: 10.0000 mg | ORAL_CAPSULE | Freq: Every day | ORAL | 0 refills | Status: DC

## 2023-07-09 MED ORDER — DIPHENHYDRAMINE HCL 25 MG PO CAPS
25.0000 mg | ORAL_CAPSULE | Freq: Once | ORAL | Status: AC
  Administered 2023-07-09: 25 mg via ORAL
  Filled 2023-07-09: qty 1

## 2023-07-09 MED ORDER — BORTEZOMIB CHEMO SQ INJECTION 3.5 MG (2.5MG/ML)
1.0400 mg/m2 | Freq: Once | INTRAMUSCULAR | Status: AC
  Administered 2023-07-09: 2.5 mg via SUBCUTANEOUS
  Filled 2023-07-09: qty 1

## 2023-07-09 MED ORDER — ACETAMINOPHEN 325 MG PO TABS
650.0000 mg | ORAL_TABLET | Freq: Once | ORAL | Status: AC
  Administered 2023-07-09: 650 mg via ORAL
  Filled 2023-07-09: qty 2

## 2023-07-09 NOTE — Assessment & Plan Note (Signed)
 Recent myeloma panel showed positive response to therapy We will continue treatment as prescribed I plan to prescribe further dexamethasone  taper He will only take dexamethasone  once a week, 20 mg every Friday He will continue acyclovir  for antimicrobial prophylaxis He will continue aspirin  for DVT prophylaxis We would discontinue allopurinol  He will not get Zometa  yet until he get dental clearance

## 2023-07-09 NOTE — Patient Instructions (Signed)
 [facility] - A DEPT OF [facility]  Discharge Instructions: Thank you for choosing [facility] to provide your oncology and hematology care.   If you have a lab appointment with the Cancer Center, please go directly to the Cancer Center and check in at the registration area.   Wear comfortable clothing and clothing appropriate for easy access to any Portacath or PICC line.   We strive to give you quality time with your provider. You may need to reschedule your appointment if you arrive late (15 or more minutes).  Arriving late affects you and other patients whose appointments are after yours.  Also, if you miss three or more appointments without notifying the office, you may be dismissed from the clinic at the provider's discretion.      For prescription refill requests, have your pharmacy contact our office and allow 72 hours for refills to be completed.    Today you received the following chemotherapy and/or immunotherapy agents: Velcade/Darzalex Faspro      To help prevent nausea and vomiting after your treatment, we encourage you to take your nausea medication as directed.  BELOW ARE SYMPTOMS THAT SHOULD BE REPORTED IMMEDIATELY: *FEVER GREATER THAN 100.4 F (38 C) OR HIGHER *CHILLS OR SWEATING *NAUSEA AND VOMITING THAT IS NOT CONTROLLED WITH YOUR NAUSEA MEDICATION *UNUSUAL SHORTNESS OF BREATH *UNUSUAL BRUISING OR BLEEDING *URINARY PROBLEMS (pain or burning when urinating, or frequent urination) *BOWEL PROBLEMS (unusual diarrhea, constipation, pain near the anus) TENDERNESS IN MOUTH AND THROAT WITH OR WITHOUT PRESENCE OF ULCERS (sore throat, sores in mouth, or a toothache) UNUSUAL RASH, SWELLING OR PAIN  UNUSUAL VAGINAL DISCHARGE OR ITCHING   Items with * indicate a potential emergency and should be followed up as soon as possible or go to the Emergency Department if any problems should occur.  Please show the CHEMOTHERAPY ALERT CARD or  IMMUNOTHERAPY ALERT CARD at check-in to the Emergency Department and triage nurse.  Should you have questions after your visit or need to cancel or reschedule your appointment, please contact [facility] - A DEPT OF [facility]  Dept: [phone]  and follow the prompts.  Office hours are 8:00 a.m. to 4:30 p.m. Monday - Friday. Please note that voicemails left after 4:00 p.m. may not be returned until the following business day.  We are closed weekends and major holidays. You have access to a nurse at all times for urgent questions. Please call the main number to the clinic Dept: [phone] and follow the prompts.   For any non-urgent questions, you may also contact your provider using MyChart. We now offer e-Visits for anyone 25 and older to request care online for non-urgent symptoms. For details visit [email or web address].   Also download the MyChart app! Go to the app store, search "MyChart", open the app, select [place], and log in with your MyChart username and password.

## 2023-07-09 NOTE — Assessment & Plan Note (Signed)
 Due to recent treatment, he is not symptomatic Observe only

## 2023-07-09 NOTE — Assessment & Plan Note (Signed)
 Recommend conservative approach with close monitoring

## 2023-07-09 NOTE — Progress Notes (Signed)
 Cardiology Office Note:  .   Date:  07/12/2023  ID:  Daniel Whitaker, DOB Apr 03, 1946, MRN [id number] PCP: Glena Landau, MD  [facility] Providers Cardiologist:  Eilleen Grates, MD }   History of Present Illness: .   Daniel Whitaker is a 78 y.o. adult with hx of CAD Left heart cath in May 2023 showed 85% prox LAD disease - DES was placed with 0% residual stenosis,  hyperlipidemia, multiple myeloma, and obesity. When last seen by Dr, Lavonne Prairie on 05/10/2023 Plavix  was discontinued as it has been over one year.   He comes today due to complaints of swelling and dyspnea.  The patient's medical history has become more complicated he is now being treated for multiple myeloma since December 2024 he is been on tapering dose doses of steroids.  He began to have some chest fullness and orthopnea.  He did have some lower extremity edema.  On July 01, 2023 he had significant dyspnea and chest pain and was taken to the hospital by EMS.  He also has history of panic attacks.  Primary care had increased his Lasix  dose to 40 mg daily and his weight decreased 10 to 12 pounds.  He continues to have issues with dyspnea he is not very active that he had been prior to his diagnosis of multiple myeloma.  He is getting lab work done every Friday through oncology prior to infusions.  I have reviewed his most recent lab work dated 07/09/2023 creatinine 0.88 potassium was down to 3.4 GFR 22.  BNP was 171.9 on 07/02/2023.  We are uncertain about his dry weight as he has lost a good bit of weight from diuresis as well as from multiple myeloma.  He is currently at 226 pounds.  He is very deconditioned and frail uses a walker for ambulation.  Denies chest pain now, has dyspnea on exertion, and generalized fatigue and weakness.  ROS: As above otherwise negative  Studies Reviewed: Aaron Aas   EKG Interpretation Date/Time:  Monday July 12 2023 15:10:53 EST Ventricular Rate:  80 PR Interval:  168 QRS Duration:  106 QT  Interval:  392 QTC Calculation: 452 R Axis:   -11  Text Interpretation: Normal sinus rhythm Moderate voltage criteria for LVH, may be normal variant ( R in aVL , Cornell product ) When compared with ECG of 03-May-2023 00:12, PREVIOUS ECG IS PRESENT Confirmed by Friddie Jetty [phone]) on 07/12/2023 5:17:02 PM    Echocardiogram: 6, 7, 2022 1. Left ventricular ejection fraction, by estimation, is 60 to 65%. Left  ventricular ejection fraction by 3D volume is 63 %. The left ventricle has  normal function. The left ventricle has no regional wall motion  abnormalities. There is mild concentric  left ventricular hypertrophy. Left ventricular diastolic parameters are  consistent with Grade I diastolic dysfunction (impaired relaxation).   2. Right ventricular systolic function is normal. The right ventricular  size is normal. There is normal pulmonary artery systolic pressure. The  estimated right ventricular systolic pressure is 30.9 mmHg.   3. Left atrial size was mildly dilated.   4. The mitral valve is normal in structure. Trivial mitral valve  regurgitation. No evidence of mitral stenosis.   5. The aortic valve is tricuspid. Aortic valve regurgitation is not  visualized. No aortic stenosis is present.   6. Aortic dilatation noted. There is mild dilatation of the ascending  aorta, measuring 40 mm.   7. The inferior vena cava is normal in size with greater than  50%  respiratory variability, suggesting right atrial pressure of 3 mmHg.   Physical Exam:   VS:  BP 130/60 (BP Location: Left Arm, Patient Position: Sitting, Cuff Size: Normal)   Pulse 80   Ht 6' (1.829 m)   Wt 226 lb 3.2 oz (102.6 kg)   SpO2 95%   BMI 30.68 kg/m    Wt Readings from Last 3 Encounters:  07/12/23 226 lb 3.2 oz (102.6 kg)  07/09/23 226 lb 11.2 oz (102.8 kg)  07/02/23 236 lb 9.6 oz (107.3 kg)    GEN: Well nourished, well developed in no acute distress, pale, frail, poor memory NECK: No JVD; No carotid  bruits CARDIAC: RRR, murmurs, rubs, gallops RESPIRATORY:  Clear to auscultation without rales, wheezing or rhonchi  ABDOMEN: Soft, non-tender, non-distended EXTREMITIES:  No bilateral dependent ankle edema; No deformity   ASSESSMENT AND PLAN: .    Chronic fluid retention: I am repeating echocardiogram for changes in LV function since 1 has not been completed since 2022 and he is undergoing treatment for multiple myeloma.  He is on steroids which are being tapered which may be adding to volume overload.  Last echocardiogram revealed normal LV systolic function with an EF of 60 to 65%, and grade 1 diastolic dysfunction.  The patient is very frail.  Blood pressure is stable.  Uncertain what his dry weight is now, due to his weight loss from diuretic and multiple myeloma.              I have asked him to weigh daily and write down his weight.  He will continue Lasix  40 mg daily for the rest of the week and a BMET will be ordered with his usual labs on Fridays.  If he gains 2 to 3 pounds in 24 hours or 3 to 5 pounds in 1 week he will need to go back up on Lasix  to 40 mg for 2 days and then return back to 20 mg.  If he is having to continually go back up on the Lasix  to 40 mg he may need to stay on this consistently.  Will be checking kidney function and potassium status.  I am adding potassium 20 mill equivalents to his medication regimen as he has been on Lasix  with the last potassium of 3.4.    2.   Coronary artery disease: History of drug-eluting stent to the proximal LAD.  He is on aspirin  only.  He continues on statin therapy.  He is very sedentary.  He offers no Cardiac complaints.  Continue the carvedilol  3.125 mg twice daily.  3.  Hypercholesterolemia: Remains on rosuvastatin  40 mg daily.  Goal of LDL less than 70.   Signed, Conard Decent. Vallery Gavel, ANP, AACC

## 2023-07-09 NOTE — Progress Notes (Signed)
 [facility] OFF[facility] PROGRESS NOTE  Patient Care Team: Loreli Kins, MD as PCP - General (Family Medicine) Lavona Agent, MD as PCP - Cardiology (Cardiology)  ASSESSMENT & PLAN:  Multiple myeloma without remission [facility]) Recent myeloma panel showed positive response to therapy We will continue treatment as prescribed I plan to prescribe further dexamethasone  taper He will only take dexamethasone  once a week, 20 mg every Friday He will continue acyclovir  for antimicrobial prophylaxis He will continue aspirin  for DVT prophylaxis We would discontinue allopurinol  He will not get Zometa  yet until he get dental clearance  Pancytopenia, acquired (HCC) Due to recent treatment, he is not symptomatic Observe only  Acquired hypogammaglobulinemia (HCC) He tolerated IVIG well He will continue IVIG infusion monthly  Cancer associated pain His rib pain has improved His right leg pain is stable He will continue to take oxycodone  as needed  Orthopnea He has lost 10 pounds of fluid weight His lungs are clear He will continue 40 mg of furosemide  for now  Decubitus ulcer of buttock, stage 1 Recommend conservative approach with close monitoring  No orders of the defined types were placed in this encounter.   All questions were answered. The patient knows to call the clinic with any problems, questions or concerns. The total time spent in the appointment was 30 minutes encounter with patients including review of chart and various tests results, discussions about plan of care and coordination of care plan   Daniel Bedford, MD 07/09/2023 2:41 PM  INTERVAL HISTORY: Please see below for problem oriented charting. he returns for chemo follow-up Since last time I saw him, he was prescribed high-dose furosemide  with reduced dose dexamethasone  His shortness of breath have improved a little bit Leg swelling has improved He has no rib pain He is still taking intermittent pain  medicine as needed for musculoskeletal pain He has a pressure sore on his buttock that is not healing He is wondering about returning back to work We discussed test results  REVIEW OF SYSTEMS:   Constitutional: Denies fevers, chills or abnormal weight loss Eyes: Denies blurriness of vision Ears, nose, mouth, throat, and face: Denies mucositis or sore throat Cardiovascular: Denies palpitation, chest discomfort Gastrointestinal:  Denies nausea, heartburn or change in bowel habits Lymphatics: Denies new lymphadenopathy or easy bruising Behavioral/Psych: Mood is stable, no new changes  All other systems were reviewed with the patient and are negative.  I have reviewed the past medical history, past surgical history, social history and family history with the patient and they are unchanged from previous note.  ALLERGIES:  is allergic to atorvastatin, cymbalta  [duloxetine  hcl], and penicillins.  MEDICATIONS:  Current Outpatient Medications  Medication Sig Dispense Refill   acyclovir  (ZOVIRAX ) 400 MG tablet Take 1 tablet (400 mg total) by mouth daily. 30 tablet 6   albuterol  (VENTOLIN  HFA) 108 (90 Base) MCG/ACT inhaler Inhale 1-2 puffs into the lungs every 6 (six) hours as needed for wheezing or shortness of breath.     aspirin  EC 325 MG tablet Take 1 tablet (325 mg total) by mouth daily.     brimonidine  (ALPHAGAN ) 0.2 % ophthalmic solution Place 1 drop into the left eye in the morning and at bedtime.     carvedilol  (COREG ) 3.125 MG tablet TAKE 1 TABLET BY MOUTH TWICE DAILY 180 tablet 3   cetirizine (ZYRTEC) 10 MG tablet Take 10 mg by mouth every morning.     Cholecalciferol  (VITAMIN D3) 50 MCG (2000 UT) TABS Take 2,000 Units by mouth  daily.     cyclobenzaprine  (FLEXERIL ) 10 MG tablet Take 10 mg by mouth 3 (three) times daily as needed for muscle spasms.     dexamethasone  (DECADRON ) 4 MG tablet Take 5 pills once a week on Fridays.     docusate sodium  (COLACE) 100 MG capsule Take 100-200 mg by  mouth 2 (two) times daily as needed for mild constipation.     dorzolamide -timolol  (COSOPT ) 22.3-6.8 MG/ML ophthalmic solution Place 1 drop into both eyes 2 (two) times daily.     furosemide  (LASIX ) 40 MG tablet Take 1 tablet (40 mg total) by mouth daily. 60 tablet 1   ketoconazole (NIZORAL) 2 % cream Apply 1 application  topically daily as needed for irritation.     latanoprost  (XALATAN ) 0.005 % ophthalmic solution Place 1 drop into both eyes at bedtime.     lenalidomide  (REVLIMID ) 10 MG capsule Take 1 capsule (10 mg total) by mouth daily. Take 1 capsule daily for 14 days on and 7 days off, repeat every 21 days. [employer] Auth #  [id number] Date Obtained 07/09/23 14 capsule 0   lidocaine  (XYLOCAINE ) 5 % ointment Apply 1 Application topically 2 (two) times daily as needed. (Patient taking differently: Apply 1 Application topically 2 (two) times daily as needed (for foot pain).) 35.44 g 1   magnesium  oxide (MAG-OX) 400 (240 Mg) MG tablet Take 400 mg by mouth daily.     Multiple Vitamins-Minerals (CENTRUM SILVER 50+MEN) TABS Take 1 tablet by mouth daily with breakfast.     nitroGLYCERIN  (NITROSTAT ) 0.4 MG SL tablet Place 1 tablet (0.4 mg total) under the tongue every 5 (five) minutes as needed. (Patient taking differently: Place 0.4 mg under the tongue every 5 (five) minutes as needed for chest pain.) 25 tablet 2   Omega-3 Fatty Acids (FISH OIL) 1200 MG CAPS Take 3,600 mg by mouth daily. 3     ondansetron  (ZOFRAN ) 8 MG tablet Take 1 tablet (8 mg total) by mouth every 8 (eight) hours as needed for nausea or vomiting. 30 tablet 1   Oxycodone  HCl 10 MG TABS Take 1 tablet (10 mg total) by mouth every 4 (four) hours as needed. 90 tablet 0   pantoprazole  (PROTONIX ) 40 MG tablet Take 40 mg by mouth daily before breakfast.     polyethylene glycol (MIRALAX  / GLYCOLAX ) 17 g packet Take 17 g by mouth daily.     pregabalin  (LYRICA ) 100 MG capsule TAKE 1 CAPSULE BY MOUTH IN THE MORNING, AND AT NOON, AND 2 CAPSULES AT  NIGHT (Patient taking differently: Take 100 mg by mouth in the morning and at bedtime.) 120 capsule 5   prochlorperazine  (COMPAZINE ) 10 MG tablet Take 1 tablet (10 mg total) by mouth every 6 (six) hours as needed for nausea or vomiting. 30 tablet 1   rosuvastatin  (CRESTOR ) 40 MG tablet TAKE 1 TABLET BY MOUTH DAILY 90 tablet 0   senna-docusate (SENOKOT-S) 8.6-50 MG tablet Take 1 tablet by mouth 2 (two) times daily.     triamcinolone  (NASACORT ) 55 MCG/ACT AERO nasal inhaler Place 2 sprays into the nose daily. 1 each 12   TYLENOL  500 MG tablet Take 500-1,000 mg by mouth every 6 (six) hours as needed for mild pain (pain score 1-3) or headache.     No current facility-administered medications for this visit.   Facility-Administered Medications Ordered in Other Visits  Medication Dose Route Frequency Provider Last Rate Last Admin   bortezomib  SQ (VELCADE ) chemo injection (2.5mg /mL concentration) 2.5 mg  1.04 mg/m2 (Treatment  Plan Recorded) Subcutaneous Once Lonn Hicks, MD       daratumumab -hyaluronidase -fihj (DARZALEX  FASPRO) 1800-30000 MG-UT/15ML chemo SQ injection 1,800 mg  1,800 mg Subcutaneous Once Lonn Hicks, MD        SUMMARY OF ONCOLOGIC HISTORY: Oncology History Overview Note  Myeloma FISH was neg Normal cytogenetics   Multiple myeloma without remission (HCC)  07/11/2013 Initial Diagnosis   Multiple myeloma without remission (HCC)   04/22/2023 PET scan   NM PET Image Initial (PI) Whole Body (F-18 FDG)  Result Date: 05/03/2023 CLINICAL DATA:  Subsequent treatment strategy for multiple myeloma. EXAM: NUCLEAR MEDICINE PET WHOLE BODY TECHNIQUE: 13 mCi F-18 FDG was injected intravenously. Full-ring PET imaging was performed from the head to foot after the radiotracer. CT data was obtained and used for attenuation correction and anatomic localization. Fasting blood glucose: 114 mg/dl COMPARISON:  None Available. FINDINGS: Mediastinal blood pool activity: SUV max 2.0 HEAD/NECK: No  hypermetabolic activity in the scalp. No hypermetabolic cervical lymph nodes. Incidental CT findings: Is CHEST: No hypermetabolic mediastinal or hilar nodes. No suspicious pulmonary nodules on the CT scan. Incidental CT findings: none ABDOMEN/PELVIS: No abnormal hypermetabolic activity within the liver, pancreas, adrenal glands, or spleen. No hypermetabolic lymph nodes in the abdomen or pelvis. Incidental CT findings: none SKELETON: There multiple foci focal radiotracer activity within the skeleton. Lesions are accompaniedlytic change on CT portion exam. There approximately 50 lesions. For example lesion the posterior RIGHT fifth rib with SUV max equal 3.6 on image 120. There is a lucent lesion and pathologic fracture associated with hypermetabolic activity the posterior LEFT sixth rib with SUV max equal 6.4 on image 129. Multiple lesions throughout the spine. Most intense lesion spinal lesion has associated soft tissue expansion at the the T2 vertebral body measuring 2.5 cm (107) with SUV max equal 7.3 Lesion in the inferior LEFT pubic rami subtle lucency on image 299. Suspicious lesions in LEFT and RIGHT femoral necks. Incidental CT findings: none EXTREMITIES: Hypermetabolic lesions RIGHT humeral shaft. Lesion the proximal LEFT and RIGHT femurs. Incidental CT findings: none IMPRESSION: 1. Multiple hypermetabolic lytic lesions throughout the skeleton consistent with multiple myeloma. Approximately 50 lesions within the axillary and appendicular skeleton. Many lesions accompanied lytic/lucent lesions on CT. 2. No evidence of soft tissue plasmacytoma. Electronically Signed   By: Jackquline Boxer M.D.   On: 05/03/2023 15:16   CT Chest W Contrast  Result Date: 05/03/2023 CLINICAL DATA:  Pneumonia with complication suspected. X-ray done. Cough with rib pain after coughing. Shortness of breath and right-sided pain. History of multiple myeloma. EXAM: CT CHEST WITH CONTRAST TECHNIQUE: Multidetector CT imaging of the  chest was performed during intravenous contrast administration. RADIATION DOSE REDUCTION: This exam was performed according to the departmental dose-optimization program which includes automated exposure control, adjustment of the mA and/or kV according to patient size and/or use of iterative reconstruction technique. CONTRAST:  60mL OMNIPAQUE  IOHEXOL  300 MG/ML  SOLN COMPARISON:  Chest radiograph 05/02/2023.  PET-CT 04/22/2023 FINDINGS: Cardiovascular: Mild cardiac enlargement. No pericardial effusions. Normal caliber thoracic aorta. Calcification of the aorta and coronary arteries. Mediastinum/Nodes: Thyroid  gland is unremarkable. Esophagus is decompressed. Scattered mediastinal lymph nodes are not pathologically enlarged. Lungs/Pleura: Motion artifact limits examination. There is evidence of atelectasis or consolidation in both lung bases. Small bilateral pleural effusions. Changes could represent pneumonia. No pneumothorax. Upper Abdomen: No acute abnormalities. Musculoskeletal: Degenerative changes in the spine and shoulders. Multiple lucent lesions demonstrated in the ribs, spine, and shoulders consistent with history of myeloma. Fractures of the right  lateral fifth, sixth, and seventh ribs as well as the right tenth rib. Fractures of the left third, fourth, and sixth ribs. No vertebral compression deformities are demonstrated although large lucent lesions are demonstrated in the T1 and T2 vertebrae. IMPRESSION: 1. Multiple lucent lesions demonstrated in the ribs, spine, and shoulders corresponding to the patient's history of multiple myeloma. 2. Multiple bilateral acute rib fractures. 3. Small bilateral pleural effusions with basilar atelectasis or consolidation, possibly pneumonia. Electronically Signed   By: Daniel Gravely M.D.   On: 05/03/2023 01:21   DG Chest Port 1 View  Result Date: 05/02/2023 CLINICAL DATA:  Shortness of breath and cough. History of multiple myeloma. Right-sided pain. EXAM:  PORTABLE CHEST 1 VIEW COMPARISON:  04/20/2023 FINDINGS: Shallow inspiration. Cardiac enlargement. No vascular congestion or edema. Linear atelectasis in the lung bases. No pleural effusions. No pneumothorax. Calcification of the aorta. A nondisplaced right lateral ninth rib fracture is suggested. IMPRESSION: Shallow inspiration with atelectasis in the lung bases. Cardiac enlargement. Probable right ninth rib fracture. Electronically Signed   By: Daniel Gravely M.D.   On: 05/02/2023 23:45   CT BONE MARROW BIOPSY & ASPIRATION  Result Date: 04/27/2023 INDICATION: myeloma staging EXAM: CT GUIDED BONE MARROW ASPIRATION AND CORE BIOPSY MEDICATIONS: None. ANESTHESIA/SEDATION: Moderate (conscious) sedation was employed during this procedure. A total of Versed  2 mg and Fentanyl  100 mcg was administered intravenously. Moderate Sedation Time: 13 minutes. The patient's level of consciousness and vital signs were monitored continuously by radiology nursing throughout the procedure under my direct supervision. FLUOROSCOPY TIME:  CT dose; 122 mGycm COMPLICATIONS: None immediate. Estimated blood loss: <5 mL PROCEDURE: RADIATION DOSE REDUCTION: This exam was performed according to the departmental dose-optimization program which includes automated exposure control, adjustment of the mA and/or kV according to patient size and/or use of iterative reconstruction technique. Informed written consent was obtained from the patient after a thorough discussion of the procedural risks, benefits and alternatives. All questions were addressed. Maximal Sterile Barrier Technique was utilized including caps, mask, sterile gowns, sterile gloves, sterile drape, hand hygiene and skin antiseptic. A timeout was performed prior to the initiation of the procedure. The patient was positioned prone and non-contrast localization CT was performed of the pelvis to demonstrate the iliac marrow spaces. Maximal barrier sterile technique utilized including  caps, mask, sterile gowns, sterile gloves, large sterile drape, hand hygiene, and chlorhexidine  prep. Under sterile conditions and local anesthesia, an 11 gauge coaxial bone biopsy needle was advanced into the RIGHT iliac marrow space. Needle position was confirmed with CT imaging. Initially, bone marrow aspiration was performed. Next, the 11 gauge outer cannula was utilized to obtain a 1 iliac bone marrow core biopsy. Needle was removed. Hemostasis was obtained with compression. The patient tolerated the procedure well. Samples were prepared with the cytotechnologist. IMPRESSION: Successful CT-guided bone marrow aspiration and biopsy. Thom Hall, MD [facility] Electronically Signed   By: Thom Hall M.D.   On: 04/27/2023 12:32   DG Chest 2 View  Result Date: 04/23/2023 CLINICAL DATA:  Cough for 2 months Pain between shoulder blades EXAM: CHEST - 2 VIEW COMPARISON:  08/24/2019 FINDINGS: Cardiomediastinal silhouette and pulmonary vasculature are within normal limits. Lungs are hyperexpanded but otherwise clear. Unchanged dextroconvex curvature of the thoracolumbar spine. IMPRESSION: 1. No acute cardiopulmonary process. 2. Hyperexpanded lungs, suspicious for emphysema. Electronically Signed   By: Aliene Lloyd M.D.   On: 04/23/2023 16:44      04/27/2023 Pathology Results  Surgical Pathology CASE: [id number] PATIENT: Daniel [facility] Bone Marrow Report  Clinical History: Myeloma  DIAGNOSIS:  BONE MARROW, ASPIRATE, CLOT, CORE: -Hypercellular bone marrow with plasma cell neoplasm -See comment  PERIPHERAL BLOOD: -Normocytic-normochromic anemia -Leukocytosis with neutrophilic left shift  COMMENT:  The bone marrow is hypercellular for age with increased number of plasma cells averaging 15% of all cells in the aspirate.  The clot and biopsy sections show interstitial infiltrates, numerous variably sized clusters in addition to an area  of diffuse sheet of atypical plasma cells associated with kappa light chain restriction consistent with plasma cell neoplasm.  Correlation with cytogenetic and FISH studies is recommended.  MICROSCOPIC DESCRIPTION:  PERIPHERAL BLOOD SMEAR: The red blood cells display mild anisopoikilocytosis with mild to moderate polychromasia.  The white blood cells are slightly increased in number, mostly with neutrophils some of which display toxic granulation.  An occasional myelocyte is seen on scan.  The platelets are normal in number.  BONE MARROW ASPIRATE: Bone marrow particles present Erythroid precursors: Orderly and progressive maturation Granulocytic precursors: Orderly and progressive maturation for the most part Megakaryocytes: Abundant with scattered small and/or hypolobated forms or large cells. Lymphocytes/plasma cells: The plasma cells are increased in number representing 15% to all cells associated with atypical cytologic features characterized by cytomegaly and/or small nucleoli.  Large lymphoid aggregates are not seen.  TOUCH PREPARATIONS: A mixture of cell types present but with increased number of plasma cells  CLOT AND BIOPSY: The sections show variable cellularity ranging from 40 to 90% focally.  There are numerous variably sized clusters of atypical plasma cells in addition to a diffuse large sheet particularly in the area of most cellularity.  The background shows trilineage hematopoiesis.  Immunohistochemical stains for CD138 in addition to in situ hybridization for kappa and lambda were performed with appropriate controls.  CD138 highlights the increased plasma cell component consisting of interstitial infiltrates, numerous variably sized clusters, and an area of diffuse sheet particularly in the core biopsy. The plasma cells display kappa light chain restriction.  IRON STAIN: Iron stains are performed on a bone marrow aspirate or touch imprint smear and section of clot. The controls  stained appropriately.       Storage Iron: Decreased      Ring Sideroblasts: Absent  ADDITIONAL DATA/TESTING: The specimen was sent for cytogenetic analysis and FISH for multiple myeloma and a separate report will follow.    05/04/2023 Cancer Staging   Staging form: Plasma Cell Myeloma and Plasma Cell Disorders, AJCC 8th Edition - Clinical stage from 05/04/2023: RISS Stage II (Beta-2 -microglobulin (mg/L): 5.6, Albumin  (g/dL): 2.5, ISS: Stage III, High-risk cytogenetics: Absent, LDH: Normal) - Signed by Lonn Hicks, MD on 05/11/2023 Stage prefix: Initial diagnosis Beta 2 microglobulin range (mg/L): Greater than or equal to 5.5 Albumin  range (g/dL): Less than 3.5 Cytogenetics: No abnormalities   05/14/2023 -  Chemotherapy   Patient is on Treatment Plan : MYELOMA NEWLY DIAGNOSED TRANSPLANT CANDIDATE DaraVRd (Daratumumab  SQ) q21d x 6 Cycles (Induction/Consolidation)     06/18/2023 Imaging   RIGHT:         - There is no evidence of deep vein thrombosis in the lower extremity.   - No cystic structure found in the popliteal fossa.   LEFT: - No evidence of common femoral vein obstruction.         PHYSICAL EXAMINATION: ECOG PERFORMANCE STATUS: 2 - Symptomatic, <50% confined to bed  Vitals:   07/09/23 1354  BP: (!) 147/65  Pulse: 80  Resp: 18  Temp: (!) 97.2 F (36.2 C)  SpO2: 95%   Filed Weights   07/09/23 1354  Weight: 226 lb 11.2 oz (102.8 kg)    GENERAL:alert, no distress and comfortable SKIN: He has persistent sore on his buttock at stages of healing. EYES: normal, Conjunctiva are pink and non-injected, sclera clear OROPHARYNX:no exudate, no erythema and lips, buccal mucosa, and tongue normal  NECK: supple, thyroid  normal size, non-tender, without nodularity LYMPH:  no palpable lymphadenopathy in the cervical, axillary or inguinal LUNGS: clear to auscultation and percussion with normal breathing effort HEART: regular rate & rhythm and no murmurs with mild bilateral  lower extremity edema ABDOMEN:abdomen soft, non-tender and normal bowel sounds Musculoskeletal:no cyanosis of digits and no clubbing  NEURO: alert & oriented x 3 with fluent speech, no focal motor/sensory deficits  LABORATORY DATA:  I have reviewed the data as listed    Component Value Date/Time   NA 137 07/09/2023 1325   NA 139 01/12/2017 1011   K 3.4 (L) 07/09/2023 1325   K 4.3 01/12/2017 1011   CL 100 07/09/2023 1325   CO2 27 07/09/2023 1325   CO2 23 01/12/2017 1011   GLUCOSE 157 (H) 07/09/2023 1325   GLUCOSE 98 01/12/2017 1011   BUN 28 (H) 07/09/2023 1325   BUN 21.8 01/12/2017 1011   CREATININE 0.88 07/09/2023 1325   CREATININE 0.9 01/12/2017 1011   CALCIUM  9.1 07/09/2023 1325   CALCIUM  9.0 01/12/2017 1011   PROT 7.3 07/09/2023 1325   PROT 6.4 01/12/2017 1011   PROT 6.1 01/12/2017 1011   ALBUMIN  3.4 (L) 07/09/2023 1325   ALBUMIN  3.6 01/12/2017 1011   AST 12 (L) 07/09/2023 1325   AST 20 01/12/2017 1011   ALT 14 07/09/2023 1325   ALT 18 01/12/2017 1011   ALKPHOS 123 07/09/2023 1325   ALKPHOS 81 01/12/2017 1011   BILITOT 0.4 07/09/2023 1325   BILITOT 0.58 01/12/2017 1011   GFRNONAA >60 07/09/2023 1325   GFRAA >60 02/14/2020 1115    No results found for: SPEP, UPEP  Lab Results  Component Value Date   WBC 2.0 (L) 07/09/2023   NEUTROABS 1.6 (L) 07/09/2023   HGB 9.0 (L) 07/09/2023   HCT 28.0 (L) 07/09/2023   MCV 88.3 07/09/2023   PLT 238 07/09/2023      Chemistry      Component Value Date/Time   NA 137 07/09/2023 1325   NA 139 01/12/2017 1011   K 3.4 (L) 07/09/2023 1325   K 4.3 01/12/2017 1011   CL 100 07/09/2023 1325   CO2 27 07/09/2023 1325   CO2 23 01/12/2017 1011   BUN 28 (H) 07/09/2023 1325   BUN 21.8 01/12/2017 1011   CREATININE 0.88 07/09/2023 1325   CREATININE 0.9 01/12/2017 1011      Component Value Date/Time   CALCIUM  9.1 07/09/2023 1325   CALCIUM  9.0 01/12/2017 1011   ALKPHOS 123 07/09/2023 1325   ALKPHOS 81 01/12/2017 1011   AST  12 (L) 07/09/2023 1325   AST 20 01/12/2017 1011   ALT 14 07/09/2023 1325   ALT 18 01/12/2017 1011   BILITOT 0.4 07/09/2023 1325   BILITOT 0.58 01/12/2017 1011

## 2023-07-09 NOTE — Assessment & Plan Note (Signed)
 He tolerated IVIG well He will continue IVIG infusion monthly

## 2023-07-09 NOTE — Assessment & Plan Note (Signed)
 His rib pain has improved His right leg pain is stable He will continue to take oxycodone as needed

## 2023-07-09 NOTE — Progress Notes (Signed)
 Patient took his steroid at 0800 today.

## 2023-07-09 NOTE — Assessment & Plan Note (Signed)
 He has lost 10 pounds of fluid weight His lungs are clear He will continue 40 mg of furosemide  for now

## 2023-07-12 ENCOUNTER — Encounter: Payer: Self-pay | Admitting: Adult Health

## 2023-07-12 ENCOUNTER — Other Ambulatory Visit (HOSPITAL_COMMUNITY): Payer: Self-pay

## 2023-07-12 ENCOUNTER — Ambulatory Visit: Payer: Medicare Other | Attending: Adult Health | Admitting: Adult Health

## 2023-07-12 VITALS — BP 130/60 | HR 80 | Ht 72.0 in | Wt 226.2 lb

## 2023-07-12 DIAGNOSIS — C9000 Multiple myeloma not having achieved remission: Secondary | ICD-10-CM

## 2023-07-12 DIAGNOSIS — I2510 Atherosclerotic heart disease of native coronary artery without angina pectoris: Secondary | ICD-10-CM | POA: Diagnosis not present

## 2023-07-12 DIAGNOSIS — I517 Cardiomegaly: Secondary | ICD-10-CM

## 2023-07-12 DIAGNOSIS — R601 Generalized edema: Secondary | ICD-10-CM | POA: Diagnosis not present

## 2023-07-12 DIAGNOSIS — E7800 Pure hypercholesterolemia, unspecified: Secondary | ICD-10-CM | POA: Diagnosis not present

## 2023-07-12 LAB — KAPPA/LAMBDA LIGHT CHAINS
Kappa free light chain: 194.5 mg/L — ABNORMAL HIGH (ref 3.3–19.4)
Kappa, lambda light chain ratio: 60.78 — ABNORMAL HIGH (ref 0.26–1.65)
Lambda free light chains: 3.2 mg/L — ABNORMAL LOW (ref 5.7–26.3)

## 2023-07-12 MED ORDER — POTASSIUM CHLORIDE CRYS ER 20 MEQ PO TBCR
20.0000 meq | EXTENDED_RELEASE_TABLET | Freq: Every day | ORAL | 3 refills | Status: DC
  Filled 2023-07-12: qty 90, 90d supply, fill #0

## 2023-07-12 NOTE — Patient Instructions (Signed)
 Medication Instructions:  Start Potassium 20 meq ( Take 1 Tablet Daily). *If you need a refill on your cardiac medications before your next appointment, please call your pharmacy*   Lab Work: BMET If you have labs (blood work) drawn today and your tests are completely normal, you will receive your results only by: MyChart Message (if you have MyChart) OR A paper copy in the mail If you have any lab test that is abnormal or we need to change your treatment, we will call you to review the results.   Testing/Procedures: [address], [address]. Your physician has requested that you have an echocardiogram. Echocardiography is a painless test that uses sound waves to create images of your heart. It provides your doctor with information about the size and shape of your heart and how well your heart's chambers and valves are working. This procedure takes approximately one hour. There are no restrictions for this procedure. Please do NOT wear cologne, perfume, aftershave, or lotions (deodorant is allowed). Please arrive 15 minutes prior to your appointment time.  Please note: We ask at that you not bring children with you during ultrasound (echo/ vascular) testing. Due to room size and safety concerns, children are not allowed in the ultrasound rooms during exams. Our front office staff cannot provide observation of children in our lobby area while testing is being conducted. An adult accompanying a patient to their appointment will only be allowed in the ultrasound room at the discretion of the ultrasound technician under special circumstances. We apologize for any inconvenience.    Follow-Up: At [facility], you and your health needs are our priority.  As part of our continuing mission to provide you with exceptional heart care, we have created designated Provider Care Teams.  These Care Teams include your primary Cardiologist (physician) and Advanced Practice Providers (APPs -   Physician Assistants and Nurse Practitioners) who all work together to provide you with the care you need, when you need it.  We recommend signing up for the patient portal called "MyChart".  Sign up information is provided on this After Visit Summary.  MyChart is used to connect with patients for Virtual Visits (Telemedicine).  Patients are able to view lab/test results, encounter notes, upcoming appointments, etc.  Non-urgent messages can be sent to your provider as well.   To learn more about what you can do with MyChart, go to [email or web address].    Your next appointment:   1 month(s)  Provider:   Eilleen Grates, MD

## 2023-07-13 LAB — MULTIPLE MYELOMA PANEL, SERUM
Albumin SerPl Elph-Mcnc: 2.9 g/dL (ref 2.9–4.4)
Albumin/Glob SerPl: 0.8 (ref 0.7–1.7)
Alpha 1: 0.4 g/dL (ref 0.0–0.4)
Alpha2 Glob SerPl Elph-Mcnc: 0.9 g/dL (ref 0.4–1.0)
B-Globulin SerPl Elph-Mcnc: 0.8 g/dL (ref 0.7–1.3)
Gamma Glob SerPl Elph-Mcnc: 1.7 g/dL (ref 0.4–1.8)
Globulin, Total: 3.7 g/dL (ref 2.2–3.9)
IgA: 1200 mg/dL — ABNORMAL HIGH (ref 61–437)
IgG (Immunoglobin G), Serum: 501 mg/dL — ABNORMAL LOW (ref 603–1613)
IgM (Immunoglobulin M), Srm: 24 mg/dL (ref 15–143)
M Protein SerPl Elph-Mcnc: 1.2 g/dL — ABNORMAL HIGH
Total Protein ELP: 6.6 g/dL (ref 6.0–8.5)

## 2023-07-15 ENCOUNTER — Inpatient Hospital Stay: Payer: Medicare Other

## 2023-07-15 ENCOUNTER — Other Ambulatory Visit: Payer: Self-pay | Admitting: Hematology and Oncology

## 2023-07-15 DIAGNOSIS — C9000 Multiple myeloma not having achieved remission: Secondary | ICD-10-CM | POA: Diagnosis not present

## 2023-07-15 DIAGNOSIS — E8770 Fluid overload, unspecified: Secondary | ICD-10-CM | POA: Diagnosis not present

## 2023-07-15 DIAGNOSIS — Z5112 Encounter for antineoplastic immunotherapy: Secondary | ICD-10-CM | POA: Diagnosis not present

## 2023-07-15 DIAGNOSIS — R0600 Dyspnea, unspecified: Secondary | ICD-10-CM | POA: Diagnosis not present

## 2023-07-15 DIAGNOSIS — Z79899 Other long term (current) drug therapy: Secondary | ICD-10-CM | POA: Diagnosis not present

## 2023-07-15 DIAGNOSIS — G893 Neoplasm related pain (acute) (chronic): Secondary | ICD-10-CM | POA: Diagnosis not present

## 2023-07-15 DIAGNOSIS — D539 Nutritional anemia, unspecified: Secondary | ICD-10-CM | POA: Diagnosis not present

## 2023-07-15 LAB — CMP (CANCER CENTER ONLY)
ALT: 15 U/L (ref 0–44)
AST: 11 U/L — ABNORMAL LOW (ref 15–41)
Albumin: 3.1 g/dL — ABNORMAL LOW (ref 3.5–5.0)
Alkaline Phosphatase: 114 U/L (ref 38–126)
Anion gap: 3 — ABNORMAL LOW (ref 5–15)
BUN: 29 mg/dL — ABNORMAL HIGH (ref 8–23)
CO2: 30 mmol/L (ref 22–32)
Calcium: 8.8 mg/dL — ABNORMAL LOW (ref 8.9–10.3)
Chloride: 101 mmol/L (ref 98–111)
Creatinine: 0.84 mg/dL (ref 0.61–1.24)
GFR, Estimated: >60 mL/min (ref >60)
Glucose, Bld: 98 mg/dL (ref 70–99)
Potassium: 4.2 mmol/L (ref 3.5–5.1)
Sodium: 134 mmol/L — ABNORMAL LOW (ref 135–145)
Total Bilirubin: 0.4 mg/dL (ref 0.0–1.2)
Total Protein: 7.1 g/dL (ref 6.5–8.1)

## 2023-07-15 LAB — CBC WITH DIFFERENTIAL (CANCER CENTER ONLY)
Abs Immature Granulocytes: 0.03 10*3/uL (ref 0.00–0.07)
Basophils Absolute: 0.1 10*3/uL (ref 0.0–0.1)
Basophils Relative: 2 %
Eosinophils Absolute: 0.3 10*3/uL (ref 0.0–0.5)
Eosinophils Relative: 4 %
HCT: 26.1 % — ABNORMAL LOW (ref 39.0–52.0)
Hemoglobin: 8.1 g/dL — ABNORMAL LOW (ref 13.0–17.0)
Immature Granulocytes: 1 %
Lymphocytes Relative: 27 %
Lymphs Abs: 1.5 10*3/uL (ref 0.7–4.0)
MCH: 27.6 pg (ref 26.0–34.0)
MCHC: 31 g/dL (ref 30.0–36.0)
MCV: 88.8 fL (ref 80.0–100.0)
Monocytes Absolute: 1 10*3/uL (ref 0.1–1.0)
Monocytes Relative: 17 %
Neutro Abs: 2.8 10*3/uL (ref 1.7–7.7)
Neutrophils Relative %: 49 %
Platelet Count: 316 10*3/uL (ref 150–400)
RBC: 2.94 MIL/uL — ABNORMAL LOW (ref 4.22–5.81)
RDW: 17.4 % — ABNORMAL HIGH (ref 11.5–15.5)
WBC Count: 5.7 10*3/uL (ref 4.0–10.5)
nRBC: 0 % (ref 0.0–0.2)

## 2023-07-15 MED ORDER — DIPHENHYDRAMINE HCL 25 MG PO CAPS
25.0000 mg | ORAL_CAPSULE | Freq: Once | ORAL | Status: AC
  Administered 2023-07-15: 25 mg via ORAL
  Filled 2023-07-15: qty 1

## 2023-07-15 MED ORDER — ACETAMINOPHEN 325 MG PO TABS
650.0000 mg | ORAL_TABLET | Freq: Once | ORAL | Status: AC
  Administered 2023-07-15: 650 mg via ORAL
  Filled 2023-07-15: qty 2

## 2023-07-15 MED ORDER — IMMUNE GLOBULIN (HUMAN) 10 GM/100ML IV SOLN
400.0000 mg/kg | Freq: Once | INTRAVENOUS | Status: AC
  Administered 2023-07-15: 40 g via INTRAVENOUS
  Filled 2023-07-15: qty 400

## 2023-07-15 MED ORDER — DEXTROSE 5 % IV SOLN: INTRAVENOUS | Status: DC

## 2023-07-15 NOTE — Progress Notes (Signed)
Patient declined post IVIG observation period.  Tolerated treatment well without incident.  VSS at discharge.  Ambulated to lobby with walker.

## 2023-07-15 NOTE — Patient Instructions (Signed)
Immune Globulin Injection What is this medication? IMMUNE GLOBULIN (im MUNE GLOB yoo lin) treats many immune system conditions. It works by Designer, multimedia extra antibodies. Antibodies are proteins made by the immune system that help protect the body. This medicine may be used for other purposes; ask your health care provider or pharmacist if you have questions. COMMON BRAND NAME(S): ASCENIV, Baygam, BIVIGAM, Carimune, Carimune NF, cutaquig, Cuvitru, Flebogamma, Flebogamma DIF, GamaSTAN, GamaSTAN S/D, Gamimune N, Gammagard, Gammagard S/D, Gammaked, Gammaplex, Gammar-P IV, Gamunex, Gamunex-C, Hizentra, Iveegam, Iveegam EN, Octagam, Panglobulin, Panglobulin NF, panzyga, Polygam S/D, Privigen, Sandoglobulin, Venoglobulin-S, Vigam, Vivaglobulin, Xembify What should I tell my care team before I take this medication? They need to know if you have any of these conditions: Blood clotting disorder Condition where you have excess fluid in your body, such as heart failure or edema Dehydration Diabetes Have had blood clots Heart disease Immune system conditions Kidney disease Low levels of IgA Recent or upcoming vaccine An unusual or allergic reaction to immune globulin, other medications, foods, dyes, or preservatives Pregnant or trying to get pregnant Breastfeeding How should I use this medication? This medication is infused into a vein or under the skin. It is usually given by your care team in a hospital or clinic setting. It may also be given at home. If you get this medication at home, you will be taught how to prepare and give it. Use exactly as directed. Take it as directed on the prescription label at the same time every day. Keep taking it unless your care team tells you to stop. It is important that you put your used needles and syringes in a special sharps container. Do not put them in a trash can. If you do not have a sharps container, call your pharmacist or care team to get one. Talk to  your care team about the use of this medication in children. While it may be given to children for selected conditions, precautions do apply. Overdosage: If you think you have taken too much of this medicine contact a poison control center or emergency room at once. NOTE: This medicine is only for you. Do not share this medicine with others. What if I miss a dose? If you get this medication at the hospital or clinic: It is important not to miss your dose. Call your care team if you are unable to keep an appointment. If you give yourself this medication at home: If you miss a dose, take it as soon as you can. Then continue your normal schedule. If it is almost time for your next dose, take only that dose. Do not take double or extra doses. Call your care team with questions. What may interact with this medication? Live virus vaccines This list may not describe all possible interactions. Give your health care provider a list of all the medicines, herbs, non-prescription drugs, or dietary supplements you use. Also tell them if you smoke, drink alcohol, or use illegal drugs. Some items may interact with your medicine. What should I watch for while using this medication? Your condition will be monitored carefully while you are receiving this medication. Tell your care team if your symptoms do not start to get better or if they get worse. You may need blood work done while you are taking this medication. This medication increases the risk of blood clots. People with heart, blood vessel, or blood clotting conditions are more likely to develop a blood clot. Other risk factors include advanced age, estrogen  use, tobacco use, lack of movement, and being overweight. This medication can decrease the response to a vaccine. If you need to get vaccinated, tell your care team if you have received this medication within the last year. Extra booster doses may be needed. Talk to your care team to see if a different  vaccination schedule is needed. If you have diabetes, you may get a falsely elevated blood sugar reading. Talk to your care team about how to check your blood sugar while taking this medication. What side effects may I notice from receiving this medication? Side effects that you should report to your care team as soon as possible: Allergic reactions--skin rash, itching, hives, swelling of the face, lips, tongue, or throat Blood clot--pain, swelling, or warmth in the leg, shortness of breath, chest pain Fever, neck pain or stiffness, sensitivity to light, headache, nausea, vomiting, confusion, which may be signs of meningitis Hemolytic anemia--unusual weakness or fatigue, dizziness, headache, trouble breathing, dark urine, yellowing skin or eyes Kidney injury--decrease in the amount of urine, swelling of the ankles, hands, or feet Low sodium level--muscle weakness, fatigue, dizziness, headache, confusion Shortness of breath or trouble breathing, cough, unusual weakness or fatigue, blue skin or lips Side effects that usually do not require medical attention (report these to your care team if they continue or are bothersome): Chills Diarrhea Fever Headache Nausea This list may not describe all possible side effects. Call your doctor for medical advice about side effects. You may report side effects to FDA at 1-800-FDA-1088. Where should I keep my medication? Keep out of the reach of children and pets. You will be instructed on how to store this medication. Get rid of any unused medication after the expiration date. To get rid of medications that are no longer needed or have expired: Take the medication to a medication take-back program. Check with your pharmacy or law enforcement to find a location. If you cannot return the medication, ask your pharmacist or care team how to get rid of this medication safely. NOTE: This sheet is a summary. It may not cover all possible information. If you have  questions about this medicine, talk to your doctor, pharmacist, or health care provider.  2024 [employer]/Gold Standard (2023-04-30 00:00:00)

## 2023-07-16 ENCOUNTER — Inpatient Hospital Stay: Payer: Medicare Other | Admitting: Hematology and Oncology

## 2023-07-16 ENCOUNTER — Inpatient Hospital Stay: Payer: Medicare Other

## 2023-07-16 ENCOUNTER — Encounter: Payer: Self-pay | Admitting: Hematology and Oncology

## 2023-07-16 VITALS — BP 122/44 | HR 75 | Resp 18 | Ht 72.0 in | Wt 227.6 lb

## 2023-07-16 DIAGNOSIS — G893 Neoplasm related pain (acute) (chronic): Secondary | ICD-10-CM | POA: Diagnosis not present

## 2023-07-16 DIAGNOSIS — C9000 Multiple myeloma not having achieved remission: Secondary | ICD-10-CM

## 2023-07-16 DIAGNOSIS — R0601 Orthopnea: Secondary | ICD-10-CM

## 2023-07-16 DIAGNOSIS — R0600 Dyspnea, unspecified: Secondary | ICD-10-CM | POA: Diagnosis not present

## 2023-07-16 DIAGNOSIS — D539 Nutritional anemia, unspecified: Secondary | ICD-10-CM | POA: Diagnosis not present

## 2023-07-16 DIAGNOSIS — E8770 Fluid overload, unspecified: Secondary | ICD-10-CM | POA: Diagnosis not present

## 2023-07-16 DIAGNOSIS — Z79899 Other long term (current) drug therapy: Secondary | ICD-10-CM | POA: Diagnosis not present

## 2023-07-16 DIAGNOSIS — D801 Nonfamilial hypogammaglobulinemia: Secondary | ICD-10-CM

## 2023-07-16 DIAGNOSIS — Z5112 Encounter for antineoplastic immunotherapy: Secondary | ICD-10-CM | POA: Diagnosis not present

## 2023-07-16 MED ORDER — FUROSEMIDE 40 MG PO TABS: ORAL_TABLET | ORAL | 1 refills | Status: DC

## 2023-07-16 MED ORDER — ACETAMINOPHEN 325 MG PO TABS
650.0000 mg | ORAL_TABLET | Freq: Once | ORAL | Status: AC
  Administered 2023-07-16: 650 mg via ORAL
  Filled 2023-07-16: qty 2

## 2023-07-16 MED ORDER — DARATUMUMAB-HYALURONIDASE-FIHJ 1800-30000 MG-UT/15ML ~~LOC~~ SOLN
1800.0000 mg | Freq: Once | SUBCUTANEOUS | Status: AC
  Administered 2023-07-16: 1800 mg via SUBCUTANEOUS
  Filled 2023-07-16: qty 15

## 2023-07-16 MED ORDER — DIPHENHYDRAMINE HCL 25 MG PO CAPS
25.0000 mg | ORAL_CAPSULE | Freq: Once | ORAL | Status: AC
  Administered 2023-07-16: 25 mg via ORAL
  Filled 2023-07-16: qty 1

## 2023-07-16 NOTE — Progress Notes (Signed)
Pt informed this RN he took PO dexamethasone at home prior to appts today.

## 2023-07-16 NOTE — Assessment & Plan Note (Signed)
He tolerated IVIG well He will continue IVIG infusion monthly

## 2023-07-16 NOTE — Patient Instructions (Signed)

## 2023-07-16 NOTE — Assessment & Plan Note (Signed)
He has diffuse musculoskeletal pain I wonder if it could be due to side effects of statins He will continue to take oxycodone as needed

## 2023-07-16 NOTE — Assessment & Plan Note (Signed)
Multifactorial He is symptomatic I recommend holding Revlimid for 1 week and reassess

## 2023-07-16 NOTE — Progress Notes (Signed)
[facility] OFFICE PROGRESS NOTE  Patient Care Team: [name], MD as PCP - General (Family Medicine) [name], MD as PCP - Cardiology (Cardiology)  ASSESSMENT & PLAN:  Multiple myeloma without remission [facility]) Recent myeloma panel showed positive response to therapy We will continue treatment as prescribed He will only take dexamethasone once a week, 20 mg every Friday He will continue acyclovir for antimicrobial prophylaxis He will continue aspirin for DVT prophylaxis He will not get Zometa yet until he get dental clearance  Orthopnea The patient continues to have significant signs of fluid overload I have reviewed cardiology consult note I recommend his echocardiogram to be moved and done sooner I recommend increasing the dosage of his furosemide He will take 40 mg in the morning and 20 mg in the afternoon with goal to get his weight to about 220 pounds  Acquired hypogammaglobulinemia (HCC) He tolerated IVIG well He will continue IVIG infusion monthly  Cancer associated pain He has diffuse musculoskeletal pain I wonder if it could be due to side effects of statins He will continue to take oxycodone as needed  Deficiency anemia Multifactorial He is symptomatic I recommend holding Revlimid for 1 week and reassess  No orders of the defined types were placed in this encounter.   All questions were answered. The patient knows to call the clinic with any problems, questions or concerns. The total time spent in the appointment was 30 minutes encounter with patients including review of chart and various tests results, discussions about plan of care and coordination of care plan   [name], MD 07/16/2023 10:45 AM  INTERVAL HISTORY: Please see below for problem oriented charting. he returns for chemo follow-up He is accompanied by his family He has persistent symptoms of SOB, fatigue and leg swelling He has pain in his ribs, shoulders and  legs The patient denies any recent signs or symptoms of bleeding such as spontaneous epistaxis, hematuria or hematochezia.   REVIEW OF SYSTEMS:   Constitutional: Denies fevers, chills or abnormal weight loss Eyes: Denies blurriness of vision Ears, nose, mouth, throat, and face: Denies mucositis or sore throat Gastrointestinal:  Denies nausea, heartburn or change in bowel habits Skin: Denies abnormal skin rashes Lymphatics: Denies new lymphadenopathy or easy bruising Neurological:Denies numbness, tingling or new weaknesses Behavioral/Psych: Mood is stable, no new changes  All other systems were reviewed with the patient and are negative.  I have reviewed the past medical history, past surgical history, social history and family history with the patient and they are unchanged from previous note.  ALLERGIES:  is allergic to atorvastatin, cymbalta [duloxetine hcl], and penicillins.  MEDICATIONS:  Current Outpatient Medications  Medication Sig Dispense Refill   acyclovir (ZOVIRAX) 400 MG tablet Take 1 tablet (400 mg total) by mouth daily. 30 tablet 6   albuterol (VENTOLIN HFA) 108 (90 Base) MCG/ACT inhaler Inhale 1-2 puffs into the lungs every 6 (six) hours as needed for wheezing or shortness of breath.     aspirin EC 325 MG tablet Take 1 tablet (325 mg total) by mouth daily.     brimonidine (ALPHAGAN) 0.2 % ophthalmic solution Place 1 drop into the left eye in the morning and at bedtime.     carvedilol (COREG) 3.125 MG tablet TAKE 1 TABLET BY MOUTH TWICE DAILY 180 tablet 3   cetirizine (ZYRTEC) 10 MG tablet Take 10 mg by mouth every morning.     Cholecalciferol (VITAMIN D3) 50 MCG (2000 UT) TABS Take 2,000 Units by  mouth daily.     cyclobenzaprine (FLEXERIL) 10 MG tablet Take 10 mg by mouth 3 (three) times daily as needed for muscle spasms.     dexamethasone (DECADRON) 4 MG tablet Take 5 pills once a week on Fridays.     docusate sodium (COLACE) 100 MG capsule Take 100-200 mg by mouth 2  (two) times daily as needed for mild constipation.     dorzolamide-timolol (COSOPT) 22.3-6.8 MG/ML ophthalmic solution Place 1 drop into both eyes 2 (two) times daily.     furosemide (LASIX) 40 MG tablet Take 40 mg in the morning and 20 mg in the afternoon 60 tablet 1   ketoconazole (NIZORAL) 2 % cream Apply 1 application  topically daily as needed for irritation.     latanoprost (XALATAN) 0.005 % ophthalmic solution Place 1 drop into both eyes at bedtime.     lenalidomide (REVLIMID) 10 MG capsule Take 1 capsule (10 mg total) by mouth daily. Take 1 capsule daily for 14 days on and 7 days off, repeat every 21 days. [employer] Auth #  [id number] Date Obtained 07/09/23 14 capsule 0   lidocaine (XYLOCAINE) 5 % ointment Apply 1 Application topically 2 (two) times daily as needed. (Patient taking differently: Apply 1 Application topically 2 (two) times daily as needed (for foot pain).) 35.44 g 1   magnesium oxide (MAG-OX) 400 (240 Mg) MG tablet Take 400 mg by mouth daily.     Multiple Vitamins-Minerals (CENTRUM SILVER 50+MEN) TABS Take 1 tablet by mouth daily with breakfast.     nitroGLYCERIN (NITROSTAT) 0.4 MG SL tablet Place 1 tablet (0.4 mg total) under the tongue every 5 (five) minutes as needed. (Patient taking differently: Place 0.4 mg under the tongue every 5 (five) minutes as needed for chest pain.) 25 tablet 2   Omega-3 Fatty Acids (FISH OIL) 1200 MG CAPS Take 3,600 mg by mouth daily. 3     ondansetron (ZOFRAN) 8 MG tablet Take 1 tablet (8 mg total) by mouth every 8 (eight) hours as needed for nausea or vomiting. 30 tablet 1   Oxycodone HCl 10 MG TABS Take 1 tablet (10 mg total) by mouth every 4 (four) hours as needed. 90 tablet 0   pantoprazole (PROTONIX) 40 MG tablet Take 40 mg by mouth daily before breakfast.     polyethylene glycol (MIRALAX / GLYCOLAX) 17 g packet Take 17 g by mouth daily.     potassium chloride SA (KLOR-CON M20) 20 MEQ tablet Take 1 tablet (20 mEq total) by mouth daily. 90 tablet 3    pregabalin (LYRICA) 100 MG capsule TAKE 1 CAPSULE BY MOUTH IN THE MORNING, AND AT NOON, AND 2 CAPSULES AT NIGHT (Patient taking differently: Take 100 mg by mouth in the morning and at bedtime.) 120 capsule 5   prochlorperazine (COMPAZINE) 10 MG tablet Take 1 tablet (10 mg total) by mouth every 6 (six) hours as needed for nausea or vomiting. 30 tablet 1   rosuvastatin (CRESTOR) 40 MG tablet TAKE 1 TABLET BY MOUTH DAILY 90 tablet 0   senna-docusate (SENOKOT-S) 8.6-50 MG tablet Take 1 tablet by mouth 2 (two) times daily.     silver sulfADIAZINE (SILVADENE) 1 % cream Apply 1 Application topically as needed.     triamcinolone (NASACORT) 55 MCG/ACT AERO nasal inhaler Place 2 sprays into the nose daily. 1 each 12   TYLENOL 500 MG tablet Take 500-1,000 mg by mouth every 6 (six) hours as needed for mild pain (pain score 1-3) or headache.  No current facility-administered medications for this visit.    SUMMARY OF ONCOLOGIC HISTORY: Oncology History Overview Note  Myeloma FISH was neg Normal cytogenetics   Multiple myeloma without remission (HCC)  07/11/2013 Initial Diagnosis   Multiple myeloma without remission (HCC)   04/22/2023 PET scan   NM PET Image Initial (PI) Whole Body (F-18 FDG)  Result Date: 05/03/2023 CLINICAL DATA:  Subsequent treatment strategy for multiple myeloma. EXAM: NUCLEAR MEDICINE PET WHOLE BODY TECHNIQUE: 13 mCi F-18 FDG was injected intravenously. Full-ring PET imaging was performed from the head to foot after the radiotracer. CT data was obtained and used for attenuation correction and anatomic localization. Fasting blood glucose: 114 mg/dl COMPARISON:  None Available. FINDINGS: Mediastinal blood pool activity: SUV max 2.0 HEAD/NECK: No hypermetabolic activity in the scalp. No hypermetabolic cervical lymph nodes. Incidental CT findings: Is CHEST: No hypermetabolic mediastinal or hilar nodes. No suspicious pulmonary nodules on the CT scan. Incidental CT findings: none  ABDOMEN/PELVIS: No abnormal hypermetabolic activity within the liver, pancreas, adrenal glands, or spleen. No hypermetabolic lymph nodes in the abdomen or pelvis. Incidental CT findings: none SKELETON: There multiple foci focal radiotracer activity within the skeleton. Lesions are accompaniedlytic change on CT portion exam. There approximately 50 lesions. For example lesion the posterior RIGHT fifth rib with SUV max equal 3.6 on image 120. There is a lucent lesion and pathologic fracture associated with hypermetabolic activity the posterior LEFT sixth rib with SUV max equal 6.4 on image 129. Multiple lesions throughout the spine. Most intense lesion spinal lesion has associated soft tissue expansion at the the T2 vertebral body measuring 2.5 cm (107) with SUV max equal 7.3 Lesion in the inferior LEFT pubic rami subtle lucency on image 299. Suspicious lesions in LEFT and RIGHT femoral necks. Incidental CT findings: none EXTREMITIES: Hypermetabolic lesions RIGHT humeral shaft. Lesion the proximal LEFT and RIGHT femurs. Incidental CT findings: none IMPRESSION: 1. Multiple hypermetabolic lytic lesions throughout the skeleton consistent with multiple myeloma. Approximately 50 lesions within the axillary and appendicular skeleton. Many lesions accompanied lytic/lucent lesions on CT. 2. No evidence of soft tissue plasmacytoma. Electronically Signed   By: Genevive Bi M.D.   On: 05/03/2023 15:16   CT Chest W Contrast  Result Date: 05/03/2023 CLINICAL DATA:  Pneumonia with complication suspected. X-ray done. Cough with rib pain after coughing. Shortness of breath and right-sided pain. History of multiple myeloma. EXAM: CT CHEST WITH CONTRAST TECHNIQUE: Multidetector CT imaging of the chest was performed during intravenous contrast administration. RADIATION DOSE REDUCTION: This exam was performed according to the departmental dose-optimization program which includes automated exposure control, adjustment of the mA  and/or kV according to patient size and/or use of iterative reconstruction technique. CONTRAST:  60mL OMNIPAQUE IOHEXOL 300 MG/ML  SOLN COMPARISON:  Chest radiograph 05/02/2023.  PET-CT 04/22/2023 FINDINGS: Cardiovascular: Mild cardiac enlargement. No pericardial effusions. Normal caliber thoracic aorta. Calcification of the aorta and coronary arteries. Mediastinum/Nodes: Thyroid gland is unremarkable. Esophagus is decompressed. Scattered mediastinal lymph nodes are not pathologically enlarged. Lungs/Pleura: Motion artifact limits examination. There is evidence of atelectasis or consolidation in both lung bases. Small bilateral pleural effusions. Changes could represent pneumonia. No pneumothorax. Upper Abdomen: No acute abnormalities. Musculoskeletal: Degenerative changes in the spine and shoulders. Multiple lucent lesions demonstrated in the ribs, spine, and shoulders consistent with history of myeloma. Fractures of the right lateral fifth, sixth, and seventh ribs as well as the right tenth rib. Fractures of the left third, fourth, and sixth ribs. No vertebral compression deformities are demonstrated  although large lucent lesions are demonstrated in the T1 and T2 vertebrae. IMPRESSION: 1. Multiple lucent lesions demonstrated in the ribs, spine, and shoulders corresponding to the patient's history of multiple myeloma. 2. Multiple bilateral acute rib fractures. 3. Small bilateral pleural effusions with basilar atelectasis or consolidation, possibly pneumonia. Electronically Signed   By: Burman Nieves M.D.   On: 05/03/2023 01:21   DG Chest Port 1 View  Result Date: 05/02/2023 CLINICAL DATA:  Shortness of breath and cough. History of multiple myeloma. Right-sided pain. EXAM: PORTABLE CHEST 1 VIEW COMPARISON:  04/20/2023 FINDINGS: Shallow inspiration. Cardiac enlargement. No vascular congestion or edema. Linear atelectasis in the lung bases. No pleural effusions. No pneumothorax. Calcification of the aorta. A  nondisplaced right lateral ninth rib fracture is suggested. IMPRESSION: Shallow inspiration with atelectasis in the lung bases. Cardiac enlargement. Probable right ninth rib fracture. Electronically Signed   By: Burman Nieves M.D.   On: 05/02/2023 23:45   CT BONE MARROW BIOPSY & ASPIRATION  Result Date: 04/27/2023 INDICATION: myeloma staging EXAM: CT GUIDED BONE MARROW ASPIRATION AND CORE BIOPSY MEDICATIONS: None. ANESTHESIA/SEDATION: Moderate (conscious) sedation was employed during this procedure. A total of Versed 2 mg and Fentanyl 100 mcg was administered intravenously. Moderate Sedation Time: 13 minutes. The patient's level of consciousness and vital signs were monitored continuously by radiology nursing throughout the procedure under my direct supervision. FLUOROSCOPY TIME:  CT dose; 122 mGycm COMPLICATIONS: None immediate. Estimated blood loss: <5 mL PROCEDURE: RADIATION DOSE REDUCTION: This exam was performed according to the departmental dose-optimization program which includes automated exposure control, adjustment of the mA and/or kV according to patient size and/or use of iterative reconstruction technique. Informed written consent was obtained from the patient after a thorough discussion of the procedural risks, benefits and alternatives. All questions were addressed. Maximal Sterile Barrier Technique was utilized including caps, mask, sterile gowns, sterile gloves, sterile drape, hand hygiene and skin antiseptic. A timeout was performed prior to the initiation of the procedure. The patient was positioned prone and non-contrast localization CT was performed of the pelvis to demonstrate the iliac marrow spaces. Maximal barrier sterile technique utilized including caps, mask, sterile gowns, sterile gloves, large sterile drape, hand hygiene, and chlorhexidine prep. Under sterile conditions and local anesthesia, an 11 gauge coaxial bone biopsy needle was advanced into the RIGHT iliac marrow space.  Needle position was confirmed with CT imaging. Initially, bone marrow aspiration was performed. Next, the 11 gauge outer cannula was utilized to obtain a 1 iliac bone marrow core biopsy. Needle was removed. Hemostasis was obtained with compression. The patient tolerated the procedure well. Samples were prepared with the cytotechnologist. IMPRESSION: Successful CT-guided bone marrow aspiration and biopsy. Roanna Banning, MD [facility] Radiology Electronically Signed   By: Roanna Banning M.D.   On: 04/27/2023 12:32   DG Chest 2 View  Result Date: 04/23/2023 CLINICAL DATA:  Cough for 2 months Pain between shoulder blades EXAM: CHEST - 2 VIEW COMPARISON:  08/24/2019 FINDINGS: Cardiomediastinal silhouette and pulmonary vasculature are within normal limits. Lungs are hyperexpanded but otherwise clear. Unchanged dextroconvex curvature of the thoracolumbar spine. IMPRESSION: 1. No acute cardiopulmonary process. 2. Hyperexpanded lungs, suspicious for emphysema. Electronically Signed   By: Acquanetta Belling M.D.   On: 04/23/2023 16:44      04/27/2023 Pathology Results   Surgical Pathology CASE: [id number] PATIENT: Stephanie Coup Bone Marrow Report  Clinical History: Myeloma  DIAGNOSIS:  BONE MARROW, ASPIRATE, CLOT, CORE: -Hypercellular bone marrow with plasma cell  neoplasm -See comment  PERIPHERAL BLOOD: -Normocytic-normochromic anemia -Leukocytosis with neutrophilic left shift  COMMENT:  The bone marrow is hypercellular for age with increased number of plasma cells averaging 15% of all cells in the aspirate.  The clot and biopsy sections show interstitial infiltrates, numerous variably sized clusters in addition to an area of diffuse sheet of atypical plasma cells associated with kappa light chain restriction consistent with plasma cell neoplasm.  Correlation with cytogenetic and FISH studies is recommended.  MICROSCOPIC DESCRIPTION:  PERIPHERAL BLOOD  SMEAR: The red blood cells display mild anisopoikilocytosis with mild to moderate polychromasia.  The white blood cells are slightly increased in number, mostly with neutrophils some of which display toxic granulation.  An occasional myelocyte is seen on scan.  The platelets are normal in number.  BONE MARROW ASPIRATE: Bone marrow particles present Erythroid precursors: Orderly and progressive maturation Granulocytic precursors: Orderly and progressive maturation for the most part Megakaryocytes: Abundant with scattered small and/or hypolobated forms or large cells. Lymphocytes/plasma cells: The plasma cells are increased in number representing 15% to all cells associated with atypical cytologic features characterized by cytomegaly and/or small nucleoli.  Large lymphoid aggregates are not seen.  TOUCH PREPARATIONS: A mixture of cell types present but with increased number of plasma cells  CLOT AND BIOPSY: The sections show variable cellularity ranging from 40 to 90% focally.  There are numerous variably sized clusters of atypical plasma cells in addition to a diffuse large sheet particularly in the area of most cellularity.  The background shows trilineage hematopoiesis.  Immunohistochemical stains for CD138 in addition to in situ hybridization for kappa and lambda were performed with appropriate controls.  CD138 highlights the increased plasma cell component consisting of interstitial infiltrates, numerous variably sized clusters, and an area of diffuse sheet particularly in the core biopsy. The plasma cells display kappa light chain restriction.  IRON STAIN: Iron stains are performed on a bone marrow aspirate or touch imprint smear and section of clot. The controls stained appropriately.       Storage Iron: Decreased      Ring Sideroblasts: Absent  ADDITIONAL DATA/TESTING: The specimen was sent for cytogenetic analysis and FISH for multiple myeloma and a separate report will follow.    05/04/2023  Cancer Staging   Staging form: Plasma Cell Myeloma and Plasma Cell Disorders, AJCC 8th Edition - Clinical stage from 05/04/2023: RISS Stage II (Beta-2-microglobulin (mg/L): 5.6, Albumin (g/dL): 2.5, ISS: Stage III, High-risk cytogenetics: Absent, LDH: Normal) - Signed by [name], MD on 05/11/2023 Stage prefix: Initial diagnosis Beta 2 microglobulin range (mg/L): Greater than or equal to 5.5 Albumin range (g/dL): Less than 3.5 Cytogenetics: No abnormalities   05/14/2023 -  Chemotherapy   Patient is on Treatment Plan : MYELOMA NEWLY DIAGNOSED TRANSPLANT CANDIDATE DaraVRd (Daratumumab SQ) q21d x 6 Cycles (Induction/Consolidation)     06/18/2023 Imaging   RIGHT:         - There is no evidence of deep vein thrombosis in the lower extremity.   - No cystic structure found in the popliteal fossa.   LEFT: - No evidence of common femoral vein obstruction.         PHYSICAL EXAMINATION: ECOG PERFORMANCE STATUS: 1 - Symptomatic but completely ambulatory  Vitals:   07/16/23 0859  BP: (!) 122/44  Pulse: 75  Resp: 18  SpO2: 95%   Filed Weights   07/16/23 0859  Weight: 227 lb 9.6 oz (103.2 kg)    GENERAL:alert, no distress and comfortable HEART: he  has moderate lower extremity edema ABDOMEN:abdomen soft, non-tender and normal bowel sounds Musculoskeletal:no cyanosis of digits and no clubbing  NEURO: alert & oriented x 3 with fluent speech, no focal motor/sensory deficits  LABORATORY DATA:  I have reviewed the data as listed    Component Value Date/Time   NA 134 (L) 07/15/2023 1110   NA 139 01/12/2017 1011   K 4.2 07/15/2023 1110   K 4.3 01/12/2017 1011   CL 101 07/15/2023 1110   CO2 30 07/15/2023 1110   CO2 23 01/12/2017 1011   GLUCOSE 98 07/15/2023 1110   GLUCOSE 98 01/12/2017 1011   BUN 29 (H) 07/15/2023 1110   BUN 21.8 01/12/2017 1011   CREATININE 0.84 07/15/2023 1110   CREATININE 0.9 01/12/2017 1011   CALCIUM 8.8 (L) 07/15/2023 1110   CALCIUM 9.0 01/12/2017  1011   PROT 7.1 07/15/2023 1110   PROT 6.4 01/12/2017 1011   PROT 6.1 01/12/2017 1011   ALBUMIN 3.1 (L) 07/15/2023 1110   ALBUMIN 3.6 01/12/2017 1011   AST 11 (L) 07/15/2023 1110   AST 20 01/12/2017 1011   ALT 15 07/15/2023 1110   ALT 18 01/12/2017 1011   ALKPHOS 114 07/15/2023 1110   ALKPHOS 81 01/12/2017 1011   BILITOT 0.4 07/15/2023 1110   BILITOT 0.58 01/12/2017 1011   GFRNONAA >60 07/15/2023 1110   GFRAA >60 02/14/2020 1115    No results found for: "SPEP", "UPEP"  Lab Results  Component Value Date   WBC 5.7 07/15/2023   NEUTROABS 2.8 07/15/2023   HGB 8.1 (L) 07/15/2023   HCT 26.1 (L) 07/15/2023   MCV 88.8 07/15/2023   PLT 316 07/15/2023      Chemistry      Component Value Date/Time   NA 134 (L) 07/15/2023 1110   NA 139 01/12/2017 1011   K 4.2 07/15/2023 1110   K 4.3 01/12/2017 1011   CL 101 07/15/2023 1110   CO2 30 07/15/2023 1110   CO2 23 01/12/2017 1011   BUN 29 (H) 07/15/2023 1110   BUN 21.8 01/12/2017 1011   CREATININE 0.84 07/15/2023 1110   CREATININE 0.9 01/12/2017 1011      Component Value Date/Time   CALCIUM 8.8 (L) 07/15/2023 1110   CALCIUM 9.0 01/12/2017 1011   ALKPHOS 114 07/15/2023 1110   ALKPHOS 81 01/12/2017 1011   AST 11 (L) 07/15/2023 1110   AST 20 01/12/2017 1011   ALT 15 07/15/2023 1110   ALT 18 01/12/2017 1011   BILITOT 0.4 07/15/2023 1110   BILITOT 0.58 01/12/2017 1011

## 2023-07-16 NOTE — Assessment & Plan Note (Signed)
Recent myeloma panel showed positive response to therapy We will continue treatment as prescribed He will only take dexamethasone once a week, 20 mg every Friday He will continue acyclovir for antimicrobial prophylaxis He will continue aspirin for DVT prophylaxis He will not get Zometa yet until he get dental clearance

## 2023-07-16 NOTE — Assessment & Plan Note (Signed)
The patient continues to have significant signs of fluid overload I have reviewed cardiology consult note I recommend his echocardiogram to be moved and done sooner I recommend increasing the dosage of his furosemide He will take 40 mg in the morning and 20 mg in the afternoon with goal to get his weight to about 220 pounds

## 2023-07-19 ENCOUNTER — Telehealth: Payer: Self-pay

## 2023-07-19 ENCOUNTER — Inpatient Hospital Stay: Payer: Medicare Other

## 2023-07-19 ENCOUNTER — Other Ambulatory Visit: Payer: Self-pay

## 2023-07-19 ENCOUNTER — Inpatient Hospital Stay (HOSPITAL_BASED_OUTPATIENT_CLINIC_OR_DEPARTMENT_OTHER): Payer: Medicare Other | Admitting: Physician Assistant

## 2023-07-19 ENCOUNTER — Ambulatory Visit (HOSPITAL_COMMUNITY)
Admission: RE | Admit: 2023-07-19 | Discharge: 2023-07-19 | Disposition: A | Discharge status: Home / Self Care | Payer: Medicare Other | Source: Ambulatory Visit | Attending: Physician Assistant | Admitting: Physician Assistant

## 2023-07-19 VITALS — BP 128/60 | HR 86 | Temp 98.3°F | Resp 18 | Wt 232.0 lb

## 2023-07-19 DIAGNOSIS — C9000 Multiple myeloma not having achieved remission: Secondary | ICD-10-CM | POA: Insufficient documentation

## 2023-07-19 DIAGNOSIS — K5909 Other constipation: Secondary | ICD-10-CM

## 2023-07-19 DIAGNOSIS — R0600 Dyspnea, unspecified: Secondary | ICD-10-CM

## 2023-07-19 DIAGNOSIS — R0602 Shortness of breath: Secondary | ICD-10-CM | POA: Diagnosis not present

## 2023-07-19 DIAGNOSIS — D539 Nutritional anemia, unspecified: Secondary | ICD-10-CM | POA: Diagnosis not present

## 2023-07-19 DIAGNOSIS — G893 Neoplasm related pain (acute) (chronic): Secondary | ICD-10-CM

## 2023-07-19 DIAGNOSIS — S2243XA Multiple fractures of ribs, bilateral, initial encounter for closed fracture: Secondary | ICD-10-CM | POA: Diagnosis not present

## 2023-07-19 DIAGNOSIS — J90 Pleural effusion, not elsewhere classified: Secondary | ICD-10-CM | POA: Diagnosis not present

## 2023-07-19 DIAGNOSIS — Z79899 Other long term (current) drug therapy: Secondary | ICD-10-CM | POA: Diagnosis not present

## 2023-07-19 DIAGNOSIS — Z5112 Encounter for antineoplastic immunotherapy: Secondary | ICD-10-CM | POA: Diagnosis not present

## 2023-07-19 DIAGNOSIS — E8770 Fluid overload, unspecified: Secondary | ICD-10-CM | POA: Diagnosis not present

## 2023-07-19 DIAGNOSIS — R0601 Orthopnea: Secondary | ICD-10-CM

## 2023-07-19 DIAGNOSIS — I517 Cardiomegaly: Secondary | ICD-10-CM | POA: Diagnosis not present

## 2023-07-19 LAB — CMP (CANCER CENTER ONLY)
ALT: 15 U/L (ref 0–44)
AST: 13 U/L — ABNORMAL LOW (ref 15–41)
Albumin: 3.2 g/dL — ABNORMAL LOW (ref 3.5–5.0)
Alkaline Phosphatase: 157 U/L — ABNORMAL HIGH (ref 38–126)
Anion gap: 8 (ref 5–15)
BUN: 23 mg/dL (ref 8–23)
CO2: 26 mmol/L (ref 22–32)
Calcium: 9.2 mg/dL (ref 8.9–10.3)
Chloride: 104 mmol/L (ref 98–111)
Creatinine: 0.78 mg/dL (ref 0.61–1.24)
GFR, Estimated: >60 mL/min (ref >60)
Glucose, Bld: 105 mg/dL — ABNORMAL HIGH (ref 70–99)
Potassium: 3.9 mmol/L (ref 3.5–5.1)
Sodium: 138 mmol/L (ref 135–145)
Total Bilirubin: 0.3 mg/dL (ref 0.0–1.2)
Total Protein: 7.3 g/dL (ref 6.5–8.1)

## 2023-07-19 LAB — CBC WITH DIFFERENTIAL (CANCER CENTER ONLY)
Abs Immature Granulocytes: 0.03 10*3/uL (ref 0.00–0.07)
Basophils Absolute: 0.2 10*3/uL — ABNORMAL HIGH (ref 0.0–0.1)
Basophils Relative: 2 %
Eosinophils Absolute: 0.2 10*3/uL (ref 0.0–0.5)
Eosinophils Relative: 3 %
HCT: 26.9 % — ABNORMAL LOW (ref 39.0–52.0)
Hemoglobin: 8.5 g/dL — ABNORMAL LOW (ref 13.0–17.0)
Immature Granulocytes: 0 %
Lymphocytes Relative: 12 %
Lymphs Abs: 1 10*3/uL (ref 0.7–4.0)
MCH: 27.2 pg (ref 26.0–34.0)
MCHC: 31.6 g/dL (ref 30.0–36.0)
MCV: 86.2 fL (ref 80.0–100.0)
Monocytes Absolute: 0.4 10*3/uL (ref 0.1–1.0)
Monocytes Relative: 5 %
Neutro Abs: 6.2 10*3/uL (ref 1.7–7.7)
Neutrophils Relative %: 78 %
Platelet Count: 377 10*3/uL (ref 150–400)
RBC: 3.12 MIL/uL — ABNORMAL LOW (ref 4.22–5.81)
RDW: 17.2 % — ABNORMAL HIGH (ref 11.5–15.5)
WBC Count: 8 10*3/uL (ref 4.0–10.5)
nRBC: 0.2 % (ref 0.0–0.2)

## 2023-07-19 LAB — SAMPLE TO BLOOD BANK

## 2023-07-19 LAB — BRAIN NATRIURETIC PEPTIDE: B Natriuretic Peptide: 160.9 pg/mL — ABNORMAL HIGH (ref 0.0–100.0)

## 2023-07-19 MED ORDER — MORPHINE SULFATE (PF) 2 MG/ML IV SOLN: 2.0000 mg | Freq: Once | INTRAVENOUS | Status: DC

## 2023-07-19 MED ORDER — SODIUM CHLORIDE 0.9 % IV SOLN: Freq: Once | INTRAVENOUS | Status: AC

## 2023-07-19 MED ORDER — MORPHINE SULFATE (PF) 2 MG/ML IV SOLN
2.0000 mg | Freq: Once | INTRAVENOUS | Status: AC
  Administered 2023-07-19: 2 mg via INTRAVENOUS
  Filled 2023-07-19: qty 1

## 2023-07-19 MED ORDER — FUROSEMIDE 10 MG/ML IJ SOLN
40.0000 mg | Freq: Once | INTRAMUSCULAR | Status: AC
  Administered 2023-07-19: 40 mg via INTRAVENOUS
  Filled 2023-07-19: qty 4

## 2023-07-19 MED ORDER — ONDANSETRON HCL 4 MG/2ML IJ SOLN
4.0000 mg | Freq: Once | INTRAMUSCULAR | Status: AC
  Administered 2023-07-19: 4 mg via INTRAVENOUS
  Filled 2023-07-19: qty 2

## 2023-07-19 NOTE — Progress Notes (Signed)
Symptom Management Consult Note [facility]    Patient Care Team: [name], MD as PCP - General (Family Medicine) [name], MD as PCP - Cardiology (Cardiology)    Name / MRN / DOB: Daniel Whitaker  [id number]  02/06/1946   Date of visit: 07/19/2023   Chief Complaint/Reason for visit: shortness of breath, back pain and leg swelling   Current Therapy: Velcade and darzalex faspro  Last treatment:  Day 15   Cycle 3 on 07/26/23   ASSESSMENT & PLAN: Patient is a 78 y.o. adult with oncologic history of multiple myeloma without remission followed by Dr. Bertis Whitaker.  I have viewed most recent oncology  and cardiology notes and lab work.    #Multiple myeloma without remission - Next appointment with oncologist is 07/23/23 - Reviewed MM labs x 10 days ago with patient that showed positive treatment response   #Symptom management: Cough, shortness of breath, leg swelling -Cough with acute onset and nasal congestion. Lungs with rales in lower lobes. Will get chest xray to r/o acute infectious process. He can try Tessalon Perles or Coricidin HBP. Also recommend he continue to use incentive spirometer. -Shortness of breath exacerbated by back pain and frequent cough. He has 2+ pitting edema to BLE. BNP elevated at 160 although this is an improvement from x 2 weeks ago when it was 175. Exam is consistent with fluid overload. Patient received dose of IV lasix 40 mg in clinic. Advised to continue 60 mg daily x 1 week per oncologist's recommendation. He was previously taking 40 mg daily. Daniel Whitaker has increased 5 pounds in 3 days. -Back pain is not new. Lower extremity neuro exam is normal. Patient received dose of 2 mg IV morphine in clinic. On reassessment pain has improved and shortness of breath resolved.He has morphine prescription at home that he has not been taking. Discussed the benefit from short and long acting pain medication. He can follow up with oncologist later  this week to recheck pain.   #Constipation -Abdomen is non tender. Passing flatus. This is an AE to treatment, doubt SBO. -Discussed OTC management with Miralax and Senakot. Patient will increase Miralax to 3x day until frequency returns to normal as adding PRN morphine could worsen constipation.   Strict ED precautions discussed should symptoms worsen.   Heme/Onc History: Oncology History Overview Note  Myeloma FISH was neg Normal cytogenetics   Multiple myeloma without remission (HCC)  07/11/2013 Initial Diagnosis   Multiple myeloma without remission (HCC)   04/22/2023 PET scan   NM PET Image Initial (PI) Whole Body (F-18 FDG)  Result Date: 05/03/2023 CLINICAL DATA:  Subsequent treatment strategy for multiple myeloma. EXAM: NUCLEAR MEDICINE PET WHOLE BODY TECHNIQUE: 13 mCi F-18 FDG was injected intravenously. Full-ring PET imaging was performed from the head to foot after the radiotracer. CT data was obtained and used for attenuation correction and anatomic localization. Fasting blood glucose: 114 mg/dl COMPARISON:  None Available. FINDINGS: Mediastinal blood pool activity: SUV max 2.0 HEAD/NECK: No hypermetabolic activity in the scalp. No hypermetabolic cervical lymph nodes. Incidental CT findings: Is CHEST: No hypermetabolic mediastinal or hilar nodes. No suspicious pulmonary nodules on the CT scan. Incidental CT findings: none ABDOMEN/PELVIS: No abnormal hypermetabolic activity within the liver, pancreas, adrenal glands, or spleen. No hypermetabolic lymph nodes in the abdomen or pelvis. Incidental CT findings: none SKELETON: There multiple foci focal radiotracer activity within the skeleton. Lesions are accompaniedlytic change on CT portion exam. There approximately 50 lesions. For example  lesion the posterior RIGHT fifth rib with SUV max equal 3.6 on image 120. There is a lucent lesion and pathologic fracture associated with hypermetabolic activity the posterior LEFT sixth rib with SUV  max equal 6.4 on image 129. Multiple lesions throughout the spine. Most intense lesion spinal lesion has associated soft tissue expansion at the the T2 vertebral body measuring 2.5 cm (107) with SUV max equal 7.3 Lesion in the inferior LEFT pubic rami subtle lucency on image 299. Suspicious lesions in LEFT and RIGHT femoral necks. Incidental CT findings: none EXTREMITIES: Hypermetabolic lesions RIGHT humeral shaft. Lesion the proximal LEFT and RIGHT femurs. Incidental CT findings: none IMPRESSION: 1. Multiple hypermetabolic lytic lesions throughout the skeleton consistent with multiple myeloma. Approximately 50 lesions within the axillary and appendicular skeleton. Many lesions accompanied lytic/lucent lesions on CT. 2. No evidence of soft tissue plasmacytoma. Electronically Signed   By: Genevive Bi M.D.   On: 05/03/2023 15:16   CT Chest W Contrast  Result Date: 05/03/2023 CLINICAL DATA:  Pneumonia with complication suspected. X-ray done. Cough with rib pain after coughing. Shortness of breath and right-sided pain. History of multiple myeloma. EXAM: CT CHEST WITH CONTRAST TECHNIQUE: Multidetector CT imaging of the chest was performed during intravenous contrast administration. RADIATION DOSE REDUCTION: This exam was performed according to the departmental dose-optimization program which includes automated exposure control, adjustment of the mA and/or kV according to patient size and/or use of iterative reconstruction technique. CONTRAST:  60mL OMNIPAQUE IOHEXOL 300 MG/ML  SOLN COMPARISON:  Chest radiograph 05/02/2023.  PET-CT 04/22/2023 FINDINGS: Cardiovascular: Mild cardiac enlargement. No pericardial effusions. Normal caliber thoracic aorta. Calcification of the aorta and coronary arteries. Mediastinum/Nodes: Thyroid gland is unremarkable. Esophagus is decompressed. Scattered mediastinal lymph nodes are not pathologically enlarged. Lungs/Pleura: Motion artifact limits examination. There is evidence of  atelectasis or consolidation in both lung bases. Small bilateral pleural effusions. Changes could represent pneumonia. No pneumothorax. Upper Abdomen: No acute abnormalities. Musculoskeletal: Degenerative changes in the spine and shoulders. Multiple lucent lesions demonstrated in the ribs, spine, and shoulders consistent with history of myeloma. Fractures of the right lateral fifth, sixth, and seventh ribs as well as the right tenth rib. Fractures of the left third, fourth, and sixth ribs. No vertebral compression deformities are demonstrated although large lucent lesions are demonstrated in the T1 and T2 vertebrae. IMPRESSION: 1. Multiple lucent lesions demonstrated in the ribs, spine, and shoulders corresponding to the patient's history of multiple myeloma. 2. Multiple bilateral acute rib fractures. 3. Small bilateral pleural effusions with basilar atelectasis or consolidation, possibly pneumonia. Electronically Signed   By: Burman Nieves M.D.   On: 05/03/2023 01:21   DG Chest Port 1 View  Result Date: 05/02/2023 CLINICAL DATA:  Shortness of breath and cough. History of multiple myeloma. Right-sided pain. EXAM: PORTABLE CHEST 1 VIEW COMPARISON:  04/20/2023 FINDINGS: Shallow inspiration. Cardiac enlargement. No vascular congestion or edema. Linear atelectasis in the lung bases. No pleural effusions. No pneumothorax. Calcification of the aorta. A nondisplaced right lateral ninth rib fracture is suggested. IMPRESSION: Shallow inspiration with atelectasis in the lung bases. Cardiac enlargement. Probable right ninth rib fracture. Electronically Signed   By: Burman Nieves M.D.   On: 05/02/2023 23:45   CT BONE MARROW BIOPSY & ASPIRATION  Result Date: 04/27/2023 INDICATION: myeloma staging EXAM: CT GUIDED BONE MARROW ASPIRATION AND CORE BIOPSY MEDICATIONS: None. ANESTHESIA/SEDATION: Moderate (conscious) sedation was employed during this procedure. A total of Versed 2 mg and Fentanyl 100 mcg was administered  intravenously. Moderate Sedation Time:  13 minutes. The patient's level of consciousness and vital signs were monitored continuously by radiology nursing throughout the procedure under my direct supervision. FLUOROSCOPY TIME:  CT dose; 122 mGycm COMPLICATIONS: None immediate. Estimated blood loss: <5 mL PROCEDURE: RADIATION DOSE REDUCTION: This exam was performed according to the departmental dose-optimization program which includes automated exposure control, adjustment of the mA and/or kV according to patient size and/or use of iterative reconstruction technique. Informed written consent was obtained from the patient after a thorough discussion of the procedural risks, benefits and alternatives. All questions were addressed. Maximal Sterile Barrier Technique was utilized including caps, mask, sterile gowns, sterile gloves, sterile drape, hand hygiene and skin antiseptic. A timeout was performed prior to the initiation of the procedure. The patient was positioned prone and non-contrast localization CT was performed of the pelvis to demonstrate the iliac marrow spaces. Maximal barrier sterile technique utilized including caps, mask, sterile gowns, sterile gloves, large sterile drape, hand hygiene, and chlorhexidine prep. Under sterile conditions and local anesthesia, an 11 gauge coaxial bone biopsy needle was advanced into the RIGHT iliac marrow space. Needle position was confirmed with CT imaging. Initially, bone marrow aspiration was performed. Next, the 11 gauge outer cannula was utilized to obtain a 1 iliac bone marrow core biopsy. Needle was removed. Hemostasis was obtained with compression. The patient tolerated the procedure well. Samples were prepared with the cytotechnologist. IMPRESSION: Successful CT-guided bone marrow aspiration and biopsy. Roanna Banning, MD [facility] Electronically Signed   By: Roanna Banning M.D.   On: 04/27/2023 12:32   DG  Chest 2 View  Result Date: 04/23/2023 CLINICAL DATA:  Cough for 2 months Pain between shoulder blades EXAM: CHEST - 2 VIEW COMPARISON:  08/24/2019 FINDINGS: Cardiomediastinal silhouette and pulmonary vasculature are within normal limits. Lungs are hyperexpanded but otherwise clear. Unchanged dextroconvex curvature of the thoracolumbar spine. IMPRESSION: 1. No acute cardiopulmonary process. 2. Hyperexpanded lungs, suspicious for emphysema. Electronically Signed   By: Acquanetta Belling M.D.   On: 04/23/2023 16:44      04/27/2023 Pathology Results   Surgical Pathology CASE: [id number] PATIENT: Stephanie Coup Bone Marrow Report  Clinical History: Myeloma  DIAGNOSIS:  BONE MARROW, ASPIRATE, CLOT, CORE: -Hypercellular bone marrow with plasma cell neoplasm -See comment  PERIPHERAL BLOOD: -Normocytic-normochromic anemia -Leukocytosis with neutrophilic left shift  COMMENT:  The bone marrow is hypercellular for age with increased number of plasma cells averaging 15% of all cells in the aspirate.  The clot and biopsy sections show interstitial infiltrates, numerous variably sized clusters in addition to an area of diffuse sheet of atypical plasma cells associated with kappa light chain restriction consistent with plasma cell neoplasm.  Correlation with cytogenetic and FISH studies is recommended.  MICROSCOPIC DESCRIPTION:  PERIPHERAL BLOOD SMEAR: The red blood cells display mild anisopoikilocytosis with mild to moderate polychromasia.  The white blood cells are slightly increased in number, mostly with neutrophils some of which display toxic granulation.  An occasional myelocyte is seen on scan.  The platelets are normal in number.  BONE MARROW ASPIRATE: Bone marrow particles present Erythroid precursors: Orderly and progressive maturation Granulocytic precursors: Orderly and progressive maturation for the most part Megakaryocytes: Abundant with scattered small and/or hypolobated forms or large  cells. Lymphocytes/plasma cells: The plasma cells are increased in number representing 15% to all cells associated with atypical cytologic features characterized by cytomegaly and/or small nucleoli.  Large lymphoid aggregates are not seen.  TOUCH PREPARATIONS: A mixture of cell types present but  with increased number of plasma cells  CLOT AND BIOPSY: The sections show variable cellularity ranging from 40 to 90% focally.  There are numerous variably sized clusters of atypical plasma cells in addition to a diffuse large sheet particularly in the area of most cellularity.  The background shows trilineage hematopoiesis.  Immunohistochemical stains for CD138 in addition to in situ hybridization for kappa and lambda were performed with appropriate controls.  CD138 highlights the increased plasma cell component consisting of interstitial infiltrates, numerous variably sized clusters, and an area of diffuse sheet particularly in the core biopsy. The plasma cells display kappa light chain restriction.  IRON STAIN: Iron stains are performed on a bone marrow aspirate or touch imprint smear and section of clot. The controls stained appropriately.       Storage Iron: Decreased      Ring Sideroblasts: Absent  ADDITIONAL DATA/TESTING: The specimen was sent for cytogenetic analysis and FISH for multiple myeloma and a separate report will follow.    05/04/2023 Cancer Staging   Staging form: Plasma Cell Myeloma and Plasma Cell Disorders, AJCC 8th Edition - Clinical stage from 05/04/2023: RISS Stage II (Beta-2-microglobulin (mg/L): 5.6, Albumin (g/dL): 2.5, ISS: Stage III, High-risk cytogenetics: Absent, LDH: Normal) - Signed by [name], MD on 05/11/2023 Stage prefix: Initial diagnosis Beta 2 microglobulin range (mg/L): Greater than or equal to 5.5 Albumin range (g/dL): Less than 3.5 Cytogenetics: No abnormalities   05/14/2023 -  Chemotherapy   Patient is on Treatment Plan : MYELOMA NEWLY DIAGNOSED TRANSPLANT  CANDIDATE DaraVRd (Daratumumab SQ) q21d x 6 Cycles (Induction/Consolidation)     06/18/2023 Imaging   RIGHT:         - There is no evidence of deep vein thrombosis in the lower extremity.   - No cystic structure found in the popliteal fossa.   LEFT: - No evidence of common femoral vein obstruction.           Interval history-: Discussed the use of AI scribe software for clinical note transcription with the patient, who gave verbal consent to proceed.   Daniel Whitaker is a 78 y.o. adult with oncologic history as above presenting to [facility] today with chief complaint of back pain, shortness of breath, and leg swelling. Patient is accompanied by family member who provides additional history.  He experiences severe back pain that has been progressively worsening. The pain is described as jabbing and sharp, primarily located in the lower left back, and sometimes radiates across his back. It is exacerbated by certain movements and has become so severe that it prevents him from sleeping, allowing only one hour of sleep last night. He has been sleeping in a recliner due to discomfort when lying flat, which he believes may be contributing to his pain as the cushions are worn. He rates his pain as off the charts, particularly since yesterday, although it was previously manageable with a pain level of four to five out of ten. He does admit to similar back pain a few weeks ago that resolved once his weight was closer to 220.He has a history of sciatica, previously managed with physical therapy and dry needling, and is currently on a regimen of oxycodone every four hours and Tylenol every six hours for pain management, with Tylenol providing more relief than oxycodone.  He has a persistent cough that started on Saturday, producing green sputum, and is worse at night. He has not been taking over-the-counter medications for the cough due to concerns about interactions with  his current medications. He has  previously used Lawyer for cough management, which he found ineffective this time. No recent fevers, although he experienced a subjective fever following a recent IVIG treatment, which was noted to be a normal response per oncologist. He has a history of pneumonia x 2 months ago. No wheezing but experiences shortness of breath, which he attributes to the pain in his back. He denies any recent fall or injury.  He has a history of fluid retention and was previously treated with Lasix, with doses adjusted between 40 mg and 60 mg. His weight has increased 5 pounds over the last 3 days. He has been compliant with lasix, recently take 40 mg daily.  He reports swelling in his legs, which he describes as the worst it has been, and mentions that he does not urinate as frequently as expected given his fluid intake of approximately 100 ounces of water daily.  He has been experiencing difficulty with mobility, requiring a walker for assistance. He attempts to walk daily, aiming for 20 to 45 minutes, but notes that his activity level has been reduced due to pain. No recent falls or injuries and does not report numbness or tingling in his legs.   ROS  All other systems are reviewed and are negative for acute change except as noted in the HPI.    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   Nocturnal leg cramps 03/11/2021   Nontoxic uninodular goiter    Obesity    Peripheral neuropathy    RLS (restless legs  syndrome)    Spinal stenosis    Unspecified deficiency anemia 07/11/2013     Past Surgical History:  Procedure Laterality Date   CARDIAC CATHETERIZATION     COLON RESECTION N/A 04/21/2013   Procedure: DIAGNOSTIC LAPAROSCOPY, lysis of adhesions for partial small bowel obstruction;  Surgeon: Adolph Pollack, MD;  Location: [facility];  Service: General;  Laterality: N/A;   COLON SURGERY     partial SBO    COLONOSCOPY     CORONARY STENT INTERVENTION N/A 10/22/2021   Procedure: CORONARY STENT INTERVENTION;  Surgeon: Marykay Lex, MD;  Location: [facility] INVASIVE CV LAB;  Service: Cardiovascular;  Laterality: N/A;   KNEE SURGERY     Arthroscopic   LEFT HEART CATH AND CORONARY ANGIOGRAPHY N/A 10/22/2021   Procedure: LEFT HEART CATH AND CORONARY ANGIOGRAPHY;  Surgeon: Marykay Lex, MD;  Location: [facility] INVASIVE CV LAB;  Service: Cardiovascular;  Laterality: N/A;   SMALL INTESTINE SURGERY     Blockage    Social History   Socioeconomic History   Marital status: Married    Spouse name: Bonita Quin   Number of children: 2   Years of education: GED   Highest education level: Not on file  Occupational History   Occupation: still working  Tobacco Use   Smoking status: Former    Current packs/day: 0.00  Types: Cigarettes    Quit date: 06/01/1984    Years since quitting: 39.1   Smokeless tobacco: Never  Vaping Use   Vaping status: Never Used  Substance and Sexual Activity   Alcohol use: Not Currently    Alcohol/week: 10.0 standard drinks of alcohol    Types: 5 Glasses of wine, 5 Cans of beer per week   Drug use: No   Sexual activity: Not on file  Other Topics Concern   Not on file  Social History Narrative   Patient lives at home with his wife Bonita Quin)   Retired.    Patient has two children.   Patient is right-handed.   Patient has his GED.   Patient does not drink caffeine.   Social Drivers of Corporate investment banker Strain: Not on file  Food Insecurity: No Food Insecurity  (05/03/2023)   Hunger Vital Sign    Worried About Running Out of Food in the Last Year: Never true    Ran Out of Food in the Last Year: Never true  Transportation Needs: No Transportation Needs (05/03/2023)   PRAPARE - Administrator, Civil Service (Medical): No    Lack of Transportation (Non-Medical): No  Physical Activity: Not on file  Stress: Not on file  Social Connections: Unknown (10/10/2021)   Received from [facility]   Social Network    Social Network: Not on file  Intimate Partner Violence: Not At Risk (05/03/2023)   Humiliation, Afraid, Rape, and Kick questionnaire    Fear of Current or Ex-Partner: No    Emotionally Abused: No    Physically Abused: No    Sexually Abused: No    Family History  Problem Relation Age of Onset   Brain cancer Mother    Lung cancer Father    Heart disease Brother        No details   Dementia Brother    Neuropathy Neg Hx    Colon cancer Neg Hx    Colon polyps Neg Hx    Esophageal cancer Neg Hx    Rectal cancer Neg Hx    Stomach cancer Neg Hx    Sleep apnea Neg Hx      Current Outpatient Medications:    acyclovir (ZOVIRAX) 400 MG tablet, Take 1 tablet (400 mg total) by mouth daily., Disp: 30 tablet, Rfl: 6   albuterol (VENTOLIN HFA) 108 (90 Base) MCG/ACT inhaler, Inhale 1-2 puffs into the lungs every 6 (six) hours as needed for wheezing or shortness of breath., Disp: , Rfl:    aspirin EC 325 MG tablet, Take 1 tablet (325 mg total) by mouth daily., Disp: , Rfl:    brimonidine (ALPHAGAN) 0.2 % ophthalmic solution, Place 1 drop into the left eye in the morning and at bedtime., Disp: , Rfl:    carvedilol (COREG) 3.125 MG tablet, TAKE 1 TABLET BY MOUTH TWICE DAILY, Disp: 180 tablet, Rfl: 3   cetirizine (ZYRTEC) 10 MG tablet, Take 10 mg by mouth every morning., Disp: , Rfl:    Cholecalciferol (VITAMIN D3) 50 MCG (2000 UT) TABS, Take 2,000 Units by mouth daily., Disp: , Rfl:    cyclobenzaprine (FLEXERIL) 10 MG tablet,  Take 10 mg by mouth 3 (three) times daily as needed for muscle spasms., Disp: , Rfl:    dexamethasone (DECADRON) 4 MG tablet, Take 5 pills once a week on Fridays., Disp: , Rfl:    docusate sodium (COLACE) 100 MG capsule, Take 100-200 mg by mouth 2 (  two) times daily as needed for mild constipation., Disp: , Rfl:    dorzolamide-timolol (COSOPT) 22.3-6.8 MG/ML ophthalmic solution, Place 1 drop into both eyes 2 (two) times daily., Disp: , Rfl:    furosemide (LASIX) 40 MG tablet, Take 40 mg in the morning and 20 mg in the afternoon, Disp: 60 tablet, Rfl: 1   ketoconazole (NIZORAL) 2 % cream, Apply 1 application  topically daily as needed for irritation., Disp: , Rfl:    latanoprost (XALATAN) 0.005 % ophthalmic solution, Place 1 drop into both eyes at bedtime., Disp: , Rfl:    lenalidomide (REVLIMID) 10 MG capsule, Take 1 capsule (10 mg total) by mouth daily. Take 1 capsule daily for 14 days on and 7 days off, repeat every 21 days. [employer] Auth #  [id number] Date Obtained 07/09/23, Disp: 14 capsule, Rfl: 0   lidocaine (XYLOCAINE) 5 % ointment, Apply 1 Application topically 2 (two) times daily as needed. (Patient taking differently: Apply 1 Application topically 2 (two) times daily as needed (for foot pain).), Disp: 35.44 g, Rfl: 1   magnesium oxide (MAG-OX) 400 (240 Mg) MG tablet, Take 400 mg by mouth daily., Disp: , Rfl:    Multiple Vitamins-Minerals (CENTRUM SILVER 50+MEN) TABS, Take 1 tablet by mouth daily with breakfast., Disp: , Rfl:    nitroGLYCERIN (NITROSTAT) 0.4 MG SL tablet, Place 1 tablet (0.4 mg total) under the tongue every 5 (five) minutes as needed. (Patient taking differently: Place 0.4 mg under the tongue every 5 (five) minutes as needed for chest pain.), Disp: 25 tablet, Rfl: 2   Omega-3 Fatty Acids (FISH OIL) 1200 MG CAPS, Take 3,600 mg by mouth daily. 3, Disp: , Rfl:    ondansetron (ZOFRAN) 8 MG tablet, Take 1 tablet (8 mg total) by mouth every 8 (eight) hours as needed for nausea or  vomiting., Disp: 30 tablet, Rfl: 1   Oxycodone HCl 10 MG TABS, Take 1 tablet (10 mg total) by mouth every 4 (four) hours as needed., Disp: 90 tablet, Rfl: 0   pantoprazole (PROTONIX) 40 MG tablet, Take 40 mg by mouth daily before breakfast., Disp: , Rfl:    polyethylene glycol (MIRALAX / GLYCOLAX) 17 g packet, Take 17 g by mouth daily., Disp: , Rfl:    potassium chloride SA (KLOR-CON M20) 20 MEQ tablet, Take 1 tablet (20 mEq total) by mouth daily., Disp: 90 tablet, Rfl: 3   pregabalin (LYRICA) 100 MG capsule, TAKE 1 CAPSULE BY MOUTH IN THE MORNING, AND AT NOON, AND 2 CAPSULES AT NIGHT (Patient taking differently: Take 100 mg by mouth in the morning and at bedtime.), Disp: 120 capsule, Rfl: 5   prochlorperazine (COMPAZINE) 10 MG tablet, Take 1 tablet (10 mg total) by mouth every 6 (six) hours as needed for nausea or vomiting., Disp: 30 tablet, Rfl: 1   rosuvastatin (CRESTOR) 40 MG tablet, TAKE 1 TABLET BY MOUTH DAILY, Disp: 90 tablet, Rfl: 0   senna-docusate (SENOKOT-S) 8.6-50 MG tablet, Take 1 tablet by mouth 2 (two) times daily., Disp: , Rfl:    silver sulfADIAZINE (SILVADENE) 1 % cream, Apply 1 Application topically as needed., Disp: , Rfl:    triamcinolone (NASACORT) 55 MCG/ACT AERO nasal inhaler, Place 2 sprays into the nose daily., Disp: 1 each, Rfl: 12   TYLENOL 500 MG tablet, Take 500-1,000 mg by mouth every 6 (six) hours as needed for mild pain (pain score 1-3) or headache., Disp: , Rfl:   PHYSICAL EXAM: ECOG FS:2 - Symptomatic, <50% confined to bed  Vitals:   07/19/23 1205 07/19/23 1446  BP: 126/61 128/60  Pulse: 84 86  Resp: 18 18  Temp: 98.3 F (36.8 C)   TempSrc: Oral   SpO2: 94% 95%  Weight: 232 lb (105.2 kg)    Physical Exam Vitals and nursing note reviewed.  Constitutional:      Appearance: He is ill-appearing (chronic). He is not toxic-appearing.  HENT:     Head: Normocephalic.  Eyes:     Conjunctiva/sclera: Conjunctivae normal.  Cardiovascular:     Rate and  Rhythm: Normal rate and regular rhythm.     Pulses: Normal pulses.     Heart sounds: Normal heart sounds.  Pulmonary:     Effort: Pulmonary effort is normal. No respiratory distress.     Comments: Rales in left lower lung and mid and lower right lung Abdominal:     General: There is no distension.  Musculoskeletal:     Cervical back: Normal range of motion.     Right lower leg: 2+ Pitting Edema present.     Left lower leg: 2+ Pitting Edema present.  Skin:    General: Skin is warm and dry.  Neurological:     Mental Status: He is alert.        LABORATORY DATA: I have reviewed the data as listed    Latest Ref Rng & Units 07/19/2023   12:26 PM 07/15/2023   11:10 AM 07/09/2023    1:25 PM  CBC  WBC 4.0 - 10.5 K/uL 8.0  5.7  2.0   Hemoglobin 13.0 - 17.0 g/dL 8.5  8.1  9.0   Hematocrit 39.0 - 52.0 % 26.9  26.1  28.0   Platelets 150 - 400 K/uL 377  316  238         Latest Ref Rng & Units 07/19/2023   12:26 PM 07/15/2023   11:10 AM 07/09/2023    1:25 PM  CMP  Glucose 70 - 99 mg/dL 992  98  426   BUN 8 - 23 mg/dL 23  29  28    Creatinine 0.61 - 1.24 mg/dL 8.34  1.96  2.22   Sodium 135 - 145 mmol/L 138  134  137   Potassium 3.5 - 5.1 mmol/L 3.9  4.2  3.4   Chloride 98 - 111 mmol/L 104  101  100   CO2 22 - 32 mmol/L 26  30  27    Calcium 8.9 - 10.3 mg/dL 9.2  8.8  9.1   Total Protein 6.5 - 8.1 g/dL 7.3  7.1  7.3   Total Bilirubin 0.0 - 1.2 mg/dL 0.3  0.4  0.4   Alkaline Phos 38 - 126 U/L 157  114  123   AST 15 - 41 U/L 13  11  12    ALT 0 - 44 U/L 15  15  14         RADIOGRAPHIC STUDIES (from last 24 hours if applicable) I have personally reviewed the radiological images as listed and agreed with the findings in the report. No results found.      Visit Diagnosis: 1. Dyspnea, unspecified type   2. Multiple myeloma without remission (HCC)   3. Other constipation   4. Cancer related pain      Orders Placed This Encounter  Procedures   DG Chest 2 View    Standing  Status:   Future    Number of Occurrences:   1    Expected Date:   07/19/2023    Expiration  Date:   07/18/2024    Reason for Exam (SYMPTOM  OR DIAGNOSIS REQUIRED):   sob, peripheral edema    Preferred imaging location?:   [facility]   BNP (Brain natriuretic peptide)    Standing Status:   Future    Number of Occurrences:   1    Expected Date:   07/19/2023    Expiration Date:   07/18/2024    All questions were answered. The patient knows to call the clinic with any problems, questions or concerns. No barriers to learning was detected.  A total of more than 30 minutes were spent on this encounter with face-to-face time and non-face-to-face time, including preparing to see the patient, ordering tests and/or medications, counseling the patient and coordination of care as outlined above.    Thank you for allowing me to participate in the care of this patient.    Shanon Ace, PA-C Department of Hematology/Oncology [facility] Phone: [phone]  [phone]    07/19/2023 3:28 PM

## 2023-07-19 NOTE — Telephone Encounter (Signed)
Returned call to grandson with daughter and Mr. Rueter on the line. He is complaining of a lot of back pain, muscle pain and just miserable. He is taking oxycodone and tylenol as prescribed. RA sat 94-95%. He is taking lasix as ordered for fluid overload and has gained 3 lbs since Friday. No bm since Saturday and taking laxatives.   Instructed to go to the ER. They are requesting appt with [facility]. Told them I will call them back and speaking with [facility].  Called back and offered appt at 1200 with [facility] per K. Liston Alba, PA. They are aware of appt.  FYI

## 2023-07-20 ENCOUNTER — Ambulatory Visit (HOSPITAL_COMMUNITY): Payer: Medicare Other | Attending: Adult Health

## 2023-07-20 ENCOUNTER — Telehealth: Payer: Self-pay | Admitting: Physician Assistant

## 2023-07-20 DIAGNOSIS — I517 Cardiomegaly: Secondary | ICD-10-CM

## 2023-07-20 DIAGNOSIS — I2510 Atherosclerotic heart disease of native coronary artery without angina pectoris: Secondary | ICD-10-CM

## 2023-07-20 LAB — ECHOCARDIOGRAM LIMITED
Area-P 1/2: 4.39 cm2
S' Lateral: 3 cm

## 2023-07-20 NOTE — Telephone Encounter (Signed)
I notified Daniel Whitaker's daughter Tammy by phone regarding chest x-ray results. X-ray does not show acute infectious process however there is possible acute left posterolateral 8th rib fracture. This correlates with his pain and started after an intense coughing episode per patient. Tammy reports patient had a good evening last night and they plan to continue the oxycodone and morphine for pain control. Also recommended using incentive spirometer frequently during throughout day. Patient is scheduled to follow up with Dr. Bertis Ruddy on 07/23/23. All of her questions were answered and she expressed understanding of the plan provided.

## 2023-07-21 ENCOUNTER — Encounter: Payer: Self-pay | Admitting: Hematology and Oncology

## 2023-07-21 ENCOUNTER — Telehealth: Payer: Self-pay

## 2023-07-21 MED ORDER — SPIRONOLACTONE 25 MG PO TABS: 25.0000 mg | ORAL_TABLET | Freq: Every morning | ORAL | 0 refills | Status: DC

## 2023-07-21 NOTE — Telephone Encounter (Signed)
-----   Message from [name] sent at 07/21/2023  9:54 AM EST ----- Can you call him? Is he any better with the higher dose lasix? Goal is to get him to 220 lb If not, e-scribe spironolactone 25 mg daily to his local pharmacy, take in the morning, 30 tabs no refills

## 2023-07-21 NOTE — Telephone Encounter (Signed)
S/w pt per MD. He states he is feeling better, but BLE are still extremely swollen and he is not urinating as often as he thought he would be. Denies difficulty urinating. He reports his weight is 227 lbs today. Medication Spironolactone 25 mg daily sent in per MD. Pt educated on medication and how to take it. He verbalized agreement and understanding

## 2023-07-23 ENCOUNTER — Other Ambulatory Visit: Payer: Self-pay

## 2023-07-23 ENCOUNTER — Encounter (HOSPITAL_COMMUNITY): Payer: Self-pay

## 2023-07-23 ENCOUNTER — Telehealth: Payer: Self-pay

## 2023-07-23 ENCOUNTER — Inpatient Hospital Stay: Payer: Medicare Other

## 2023-07-23 ENCOUNTER — Inpatient Hospital Stay: Payer: Medicare Other | Admitting: Hematology and Oncology

## 2023-07-23 ENCOUNTER — Emergency Department (HOSPITAL_COMMUNITY): Payer: Medicare Other

## 2023-07-23 ENCOUNTER — Encounter: Payer: Self-pay | Admitting: Hematology and Oncology

## 2023-07-23 ENCOUNTER — Other Ambulatory Visit: Payer: Self-pay | Admitting: Hematology and Oncology

## 2023-07-23 ENCOUNTER — Observation Stay (HOSPITAL_COMMUNITY)
Admission: EM | Admit: 2023-07-23 | Discharge: 2023-07-27 | Disposition: A | Discharge status: Home / Self Care | Payer: Medicare Other | Attending: Internal Medicine | Admitting: Internal Medicine

## 2023-07-23 VITALS — BP 131/52 | HR 81 | Temp 97.5°F | Resp 18 | Ht 72.0 in | Wt 229.8 lb

## 2023-07-23 DIAGNOSIS — N19 Unspecified kidney failure: Secondary | ICD-10-CM | POA: Insufficient documentation

## 2023-07-23 DIAGNOSIS — J9621 Acute and chronic respiratory failure with hypoxia: Secondary | ICD-10-CM | POA: Diagnosis present

## 2023-07-23 DIAGNOSIS — Z87891 Personal history of nicotine dependence: Secondary | ICD-10-CM | POA: Diagnosis not present

## 2023-07-23 DIAGNOSIS — J069 Acute upper respiratory infection, unspecified: Secondary | ICD-10-CM | POA: Diagnosis present

## 2023-07-23 DIAGNOSIS — Z1152 Encounter for screening for COVID-19: Secondary | ICD-10-CM | POA: Insufficient documentation

## 2023-07-23 DIAGNOSIS — Z7982 Long term (current) use of aspirin: Secondary | ICD-10-CM | POA: Diagnosis not present

## 2023-07-23 DIAGNOSIS — Z79899 Other long term (current) drug therapy: Secondary | ICD-10-CM | POA: Diagnosis not present

## 2023-07-23 DIAGNOSIS — E876 Hypokalemia: Secondary | ICD-10-CM | POA: Diagnosis not present

## 2023-07-23 DIAGNOSIS — C9000 Multiple myeloma not having achieved remission: Secondary | ICD-10-CM

## 2023-07-23 DIAGNOSIS — R0602 Shortness of breath: Principal | ICD-10-CM

## 2023-07-23 DIAGNOSIS — Z955 Presence of coronary angioplasty implant and graft: Secondary | ICD-10-CM | POA: Diagnosis not present

## 2023-07-23 DIAGNOSIS — I2510 Atherosclerotic heart disease of native coronary artery without angina pectoris: Secondary | ICD-10-CM | POA: Diagnosis present

## 2023-07-23 DIAGNOSIS — J45909 Unspecified asthma, uncomplicated: Secondary | ICD-10-CM | POA: Diagnosis not present

## 2023-07-23 DIAGNOSIS — L8990 Pressure ulcer of unspecified site, unspecified stage: Secondary | ICD-10-CM | POA: Diagnosis present

## 2023-07-23 DIAGNOSIS — Z6831 Body mass index (BMI) 31.0-31.9, adult: Secondary | ICD-10-CM | POA: Diagnosis not present

## 2023-07-23 DIAGNOSIS — E8770 Fluid overload, unspecified: Secondary | ICD-10-CM | POA: Diagnosis not present

## 2023-07-23 DIAGNOSIS — R079 Chest pain, unspecified: Secondary | ICD-10-CM | POA: Insufficient documentation

## 2023-07-23 DIAGNOSIS — E785 Hyperlipidemia, unspecified: Secondary | ICD-10-CM | POA: Insufficient documentation

## 2023-07-23 DIAGNOSIS — E66811 Obesity, class 1: Secondary | ICD-10-CM | POA: Diagnosis not present

## 2023-07-23 DIAGNOSIS — D638 Anemia in other chronic diseases classified elsewhere: Secondary | ICD-10-CM | POA: Diagnosis not present

## 2023-07-23 DIAGNOSIS — I509 Heart failure, unspecified: Secondary | ICD-10-CM

## 2023-07-23 DIAGNOSIS — I10 Essential (primary) hypertension: Secondary | ICD-10-CM | POA: Diagnosis present

## 2023-07-23 DIAGNOSIS — R0902 Hypoxemia: Secondary | ICD-10-CM | POA: Diagnosis not present

## 2023-07-23 DIAGNOSIS — R918 Other nonspecific abnormal finding of lung field: Secondary | ICD-10-CM | POA: Diagnosis not present

## 2023-07-23 DIAGNOSIS — L89302 Pressure ulcer of unspecified buttock, stage 2: Secondary | ICD-10-CM | POA: Diagnosis not present

## 2023-07-23 DIAGNOSIS — H409 Unspecified glaucoma: Secondary | ICD-10-CM | POA: Diagnosis not present

## 2023-07-23 DIAGNOSIS — G893 Neoplasm related pain (acute) (chronic): Secondary | ICD-10-CM

## 2023-07-23 DIAGNOSIS — E6609 Other obesity due to excess calories: Secondary | ICD-10-CM | POA: Diagnosis present

## 2023-07-23 DIAGNOSIS — J9601 Acute respiratory failure with hypoxia: Secondary | ICD-10-CM | POA: Diagnosis not present

## 2023-07-23 DIAGNOSIS — I110 Hypertensive heart disease with heart failure: Secondary | ICD-10-CM | POA: Diagnosis not present

## 2023-07-23 LAB — CBC WITH DIFFERENTIAL (CANCER CENTER ONLY)
Abs Immature Granulocytes: 0.02 10*3/uL (ref 0.00–0.07)
Basophils Absolute: 0.2 10*3/uL — ABNORMAL HIGH (ref 0.0–0.1)
Basophils Relative: 3 %
Eosinophils Absolute: 0.2 10*3/uL (ref 0.0–0.5)
Eosinophils Relative: 3 %
HCT: 28.2 % — ABNORMAL LOW (ref 39.0–52.0)
Hemoglobin: 8.6 g/dL — ABNORMAL LOW (ref 13.0–17.0)
Immature Granulocytes: 0 %
Lymphocytes Relative: 30 %
Lymphs Abs: 2.2 10*3/uL (ref 0.7–4.0)
MCH: 27.3 pg (ref 26.0–34.0)
MCHC: 30.5 g/dL (ref 30.0–36.0)
MCV: 89.5 fL (ref 80.0–100.0)
Monocytes Absolute: 0.5 10*3/uL (ref 0.1–1.0)
Monocytes Relative: 6 %
Neutro Abs: 4.2 10*3/uL (ref 1.7–7.7)
Neutrophils Relative %: 58 %
Platelet Count: 391 10*3/uL (ref 150–400)
RBC: 3.15 MIL/uL — ABNORMAL LOW (ref 4.22–5.81)
RDW: 17.4 % — ABNORMAL HIGH (ref 11.5–15.5)
WBC Count: 7.2 10*3/uL (ref 4.0–10.5)
nRBC: 0 % (ref 0.0–0.2)

## 2023-07-23 LAB — BRAIN NATRIURETIC PEPTIDE: B Natriuretic Peptide: 116.3 pg/mL — ABNORMAL HIGH (ref 0.0–100.0)

## 2023-07-23 LAB — CMP (CANCER CENTER ONLY)
ALT: 10 U/L (ref 0–44)
AST: 12 U/L — ABNORMAL LOW (ref 15–41)
Albumin: 3.2 g/dL — ABNORMAL LOW (ref 3.5–5.0)
Alkaline Phosphatase: 147 U/L — ABNORMAL HIGH (ref 38–126)
Anion gap: 5 (ref 5–15)
BUN: 24 mg/dL — ABNORMAL HIGH (ref 8–23)
CO2: 29 mmol/L (ref 22–32)
Calcium: 9.5 mg/dL (ref 8.9–10.3)
Chloride: 104 mmol/L (ref 98–111)
Creatinine: 0.84 mg/dL (ref 0.61–1.24)
GFR, Estimated: >60 mL/min (ref >60)
Glucose, Bld: 94 mg/dL (ref 70–99)
Potassium: 4.3 mmol/L (ref 3.5–5.1)
Sodium: 138 mmol/L (ref 135–145)
Total Bilirubin: 0.2 mg/dL (ref 0.0–1.2)
Total Protein: 7.4 g/dL (ref 6.5–8.1)

## 2023-07-23 LAB — MAGNESIUM: Magnesium: 2 mg/dL (ref 1.7–2.4)

## 2023-07-23 LAB — RESP PANEL BY RT-PCR (RSV, FLU A&B, COVID)  RVPGX2
Influenza A by PCR: NEGATIVE
Influenza B by PCR: NEGATIVE
Resp Syncytial Virus by PCR: NEGATIVE
SARS Coronavirus 2 by RT PCR: NEGATIVE

## 2023-07-23 LAB — PHOSPHORUS: Phosphorus: 3.1 mg/dL (ref 2.5–4.6)

## 2023-07-23 LAB — TROPONIN I (HIGH SENSITIVITY): Troponin I (High Sensitivity): 7 ng/L (ref <18)

## 2023-07-23 MED ORDER — NITROGLYCERIN 0.4 MG SL SUBL: 0.4000 mg | SUBLINGUAL_TABLET | SUBLINGUAL | Status: DC | PRN

## 2023-07-23 MED ORDER — IPRATROPIUM-ALBUTEROL 0.5-2.5 (3) MG/3ML IN SOLN
3.0000 mL | Freq: Three times a day (TID) | RESPIRATORY_TRACT | Status: DC
  Administered 2023-07-24: 3 mL via RESPIRATORY_TRACT
  Filled 2023-07-23 (×2): qty 3

## 2023-07-23 MED ORDER — BRIMONIDINE TARTRATE 0.2 % OP SOLN
1.0000 [drp] | Freq: Two times a day (BID) | OPHTHALMIC | Status: DC
  Administered 2023-07-23 – 2023-07-27 (×8): 1 [drp] via OPHTHALMIC
  Filled 2023-07-23: qty 5

## 2023-07-23 MED ORDER — PANTOPRAZOLE SODIUM 40 MG PO TBEC
40.0000 mg | DELAYED_RELEASE_TABLET | Freq: Every day | ORAL | Status: DC
Start: 2023-07-24 — End: 2023-07-27
  Administered 2023-07-24 – 2023-07-27 (×4): 40 mg via ORAL
  Filled 2023-07-23 (×4): qty 1

## 2023-07-23 MED ORDER — SPIRONOLACTONE 25 MG PO TABS
25.0000 mg | ORAL_TABLET | Freq: Every day | ORAL | Status: DC
  Administered 2023-07-24 – 2023-07-27 (×4): 25 mg via ORAL
  Filled 2023-07-23 (×4): qty 1

## 2023-07-23 MED ORDER — ASPIRIN 325 MG PO TBEC
325.0000 mg | DELAYED_RELEASE_TABLET | Freq: Every day | ORAL | Status: DC
  Administered 2023-07-24 – 2023-07-27 (×4): 325 mg via ORAL
  Filled 2023-07-23 (×4): qty 1

## 2023-07-23 MED ORDER — CARVEDILOL 3.125 MG PO TABS
3.1250 mg | ORAL_TABLET | Freq: Two times a day (BID) | ORAL | Status: DC
  Administered 2023-07-23 – 2023-07-27 (×8): 3.125 mg via ORAL
  Filled 2023-07-23 (×8): qty 1

## 2023-07-23 MED ORDER — ACYCLOVIR 400 MG PO TABS
400.0000 mg | ORAL_TABLET | Freq: Every day | ORAL | Status: DC
  Administered 2023-07-24 – 2023-07-27 (×4): 400 mg via ORAL
  Filled 2023-07-23 (×4): qty 1

## 2023-07-23 MED ORDER — OXYCODONE HCL 5 MG PO TABS
10.0000 mg | ORAL_TABLET | ORAL | Status: DC | PRN
  Administered 2023-07-23 – 2023-07-26 (×7): 10 mg via ORAL
  Filled 2023-07-23 (×8): qty 2

## 2023-07-23 MED ORDER — ACETAMINOPHEN 325 MG PO TABS
650.0000 mg | ORAL_TABLET | Freq: Four times a day (QID) | ORAL | Status: DC | PRN
  Administered 2023-07-26: 650 mg via ORAL
  Filled 2023-07-23: qty 2

## 2023-07-23 MED ORDER — ONDANSETRON HCL 4 MG/2ML IJ SOLN: 4.0000 mg | Freq: Four times a day (QID) | INTRAMUSCULAR | Status: DC | PRN

## 2023-07-23 MED ORDER — METHYLPREDNISOLONE SODIUM SUCC 40 MG IJ SOLR
40.0000 mg | Freq: Once | INTRAMUSCULAR | Status: AC
  Administered 2023-07-23: 40 mg via INTRAVENOUS
  Filled 2023-07-23: qty 1

## 2023-07-23 MED ORDER — DOCUSATE SODIUM 100 MG PO CAPS
100.0000 mg | ORAL_CAPSULE | Freq: Two times a day (BID) | ORAL | Status: DC
  Administered 2023-07-24 – 2023-07-27 (×6): 100 mg via ORAL
  Filled 2023-07-23 (×8): qty 1

## 2023-07-23 MED ORDER — DORZOLAMIDE HCL-TIMOLOL MAL 2-0.5 % OP SOLN
1.0000 [drp] | Freq: Two times a day (BID) | OPHTHALMIC | Status: DC
  Administered 2023-07-23 – 2023-07-27 (×8): 1 [drp] via OPHTHALMIC
  Filled 2023-07-23: qty 10

## 2023-07-23 MED ORDER — POTASSIUM CHLORIDE CRYS ER 20 MEQ PO TBCR
20.0000 meq | EXTENDED_RELEASE_TABLET | Freq: Every day | ORAL | Status: DC
  Administered 2023-07-24 – 2023-07-27 (×4): 20 meq via ORAL
  Filled 2023-07-23 (×4): qty 1

## 2023-07-23 MED ORDER — LATANOPROST 0.005 % OP SOLN
1.0000 [drp] | Freq: Every day | OPHTHALMIC | Status: DC
  Administered 2023-07-23 – 2023-07-26 (×4): 1 [drp] via OPHTHALMIC
  Filled 2023-07-23: qty 2.5

## 2023-07-23 MED ORDER — POLYETHYLENE GLYCOL 3350 17 G PO PACK
17.0000 g | PACK | Freq: Two times a day (BID) | ORAL | Status: DC | PRN
  Administered 2023-07-24: 17 g via ORAL
  Filled 2023-07-23: qty 1

## 2023-07-23 MED ORDER — SENNOSIDES-DOCUSATE SODIUM 8.6-50 MG PO TABS
1.0000 | ORAL_TABLET | Freq: Two times a day (BID) | ORAL | Status: DC
  Administered 2023-07-24 – 2023-07-27 (×6): 1 via ORAL
  Filled 2023-07-23 (×8): qty 1

## 2023-07-23 MED ORDER — ONDANSETRON HCL 4 MG PO TABS: 4.0000 mg | ORAL_TABLET | Freq: Four times a day (QID) | ORAL | Status: DC | PRN

## 2023-07-23 MED ORDER — DEXAMETHASONE 4 MG PO TABS: ORAL_TABLET | ORAL | Status: DC

## 2023-07-23 MED ORDER — ACETAMINOPHEN 650 MG RE SUPP: 650.0000 mg | Freq: Four times a day (QID) | RECTAL | Status: DC | PRN

## 2023-07-23 MED ORDER — PREGABALIN 50 MG PO CAPS
100.0000 mg | ORAL_CAPSULE | Freq: Two times a day (BID) | ORAL | Status: DC
  Administered 2023-07-23 – 2023-07-27 (×8): 100 mg via ORAL
  Filled 2023-07-23 (×8): qty 2

## 2023-07-23 MED ORDER — FUROSEMIDE 10 MG/ML IJ SOLN
60.0000 mg | Freq: Once | INTRAMUSCULAR | Status: AC
  Administered 2023-07-23: 60 mg via INTRAVENOUS
  Filled 2023-07-23: qty 8

## 2023-07-23 NOTE — Assessment & Plan Note (Signed)
This is likely anemia of chronic disease and from recent renal failure and chemotherapy. The patient denies recent history of bleeding such as epistaxis, hematuria or hematochezia. He is asymptomatic from the anemia. We will observe for now.  I will hold his lenalidomide for now given his hypoxia

## 2023-07-23 NOTE — Assessment & Plan Note (Signed)
He has clinical signs and symptoms of fluid overload His recent repeat BNP is high Recent limited echocardiogram show preserved left ventricular ejection fraction but with limited views In comparison with his previous echocardiogram in 2022, it is not clear whether he has persistent grade 1 diastolic dysfunction or not His clinical exam showed left lung crackles with bilateral lower extremity edema He is not responding to higher dose of furosemide and I just added spironolactone 2 days ago I recommend ER evaluation The patient might need intravenous diuretic therapy and further evaluation by cardiologist

## 2023-07-23 NOTE — Telephone Encounter (Addendum)
Results viewed by patient via Mychart.----- Message from Joni Reining sent at 07/21/2023  1:20 PM EST ----- Echocardiogram shows normal heart pumping function.  There are no valvular abnormalities that are of concern.  The swelling in his legs may be from deconditioning and dependent edema along with multiple myeloma.  We have him on diuretics.  He should have a follow up to see how he is doing on the medications.

## 2023-07-23 NOTE — Plan of Care (Signed)
  Problem: Pain Managment: Goal: General experience of comfort will improve and/or be controlled Outcome: Progressing   Problem: Safety: Goal: Ability to remain free from injury will improve Outcome: Progressing   Problem: Skin Integrity: Goal: Risk for impaired skin integrity will decrease Outcome: Progressing   Problem: Activity: Goal: Risk for activity intolerance will decrease Outcome: Progressing

## 2023-07-23 NOTE — H&P (Signed)
History and Physical    Patient: Daniel Whitaker EAV:[id number] DOB: [birth year] DOA: 07/23/2023 DOS: the patient was seen and examined on 07/23/2023 PCP: [name], MD  Patient coming from: Home  Chief Complaint:  Chief Complaint  Patient presents with   Shortness of Breath   Hypoxia   HPI: Daniel Whitaker is a 78 y.o. adult with medical history significant of osteoarthritis, cataracts, CAD, rectal dysfunction, GERD, glaucoma, hypertriglyceridemia, impaired fasting glucose, MGUS, MGUS associated neuropathy, nocturnal leg cramps, nontoxic uninodular goiter, obesity, restless leg syndrome, spinal stenosis, spasm, unspecified deficiency anemia who presented to the emergency department from the cancer center with hypoxia and dyspnea.  He has been having progressively worsening dyspnea for the past 2 to 3 weeks associated with wheezing, productive cough, mild pleuritic CP, fatigue, lower extremity edema and orthopnea.  He has been sleeping on the recliner despite being on furosemide and spironolactone.  No indiscretions with sodium or fluid intake.  He has been on home oxygen as needed, but has not been needing the oxygen continuously recently.  He stated that if he takes the oxygen off his O2 saturation decreases to the 80s.  His symptoms seem to have worsened after he developed URI symptoms last week.  No sick contacts or travel history.  He stated he does not use bronchodilators at home, but there is an albuterol inhaler and his medication profile.   Lab work: CBC showed a white count of 7.2, hemoglobin 8.6 g/dL platelets 782.  Hemoglobin level is consistent with recent measurements.  CMP showed a BUN of 24 mg/dL, albumin of 3.2 g/dL, AST of 12 and alkaline phosphatase of 147 units/L.  Creatinine, the rest of the hepatic functions and electrolytes are normal.  Imaging: Portable 1 view chest radiograph showing mild bilateral interstitial opacities and bibasilar patchy and linear opacities,  which may represent pulmonary edema or atypical infection.  There is blunting of the bilateral costophrenic angles, which may represent small pleural effusions.  Similar cardiomegaly.  ED course: Initial vital signs were temperature 98 F, pulse 77, respiration 20, BP 126/66 mmHg O2 sat 93% on nasal cannula oxygen at 4 LPM.  The patient received furosemide 60 mg IVP.   Review of Systems: As mentioned in the history of present illness. All other systems reviewed and are negative. 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   Nocturnal leg cramps 03/11/2021   Nontoxic uninodular goiter    Obesity    Peripheral neuropathy    RLS (restless legs syndrome)    Spinal stenosis    Unspecified deficiency anemia 07/11/2013   Past Surgical History:  Procedure Laterality Date   CARDIAC CATHETERIZATION     COLON RESECTION N/A 04/21/2013   Procedure: DIAGNOSTIC LAPAROSCOPY, lysis of adhesions for partial small bowel obstruction;  Surgeon: Adolph Pollack, MD;  Location: [facility];  Service: General;  Laterality: N/A;   COLON SURGERY     partial SBO    COLONOSCOPY     CORONARY STENT INTERVENTION N/A 10/22/2021   Procedure: CORONARY STENT INTERVENTION;  Surgeon: Marykay Lex, MD;  Location: [facility] INVASIVE CV LAB;  Service: Cardiovascular;  Laterality: N/A;  KNEE SURGERY     Arthroscopic   LEFT HEART CATH AND CORONARY ANGIOGRAPHY N/A 10/22/2021   Procedure: LEFT HEART CATH AND CORONARY ANGIOGRAPHY;  Surgeon: Marykay Lex, MD;  Location: [facility] INVASIVE CV LAB;  Service: Cardiovascular;  Laterality: N/A;   SMALL INTESTINE SURGERY     Blockage   Social History:  reports that he quit smoking about 39 years ago.  His smoking use included cigarettes. He has never used smokeless tobacco. He reports that he does not currently use alcohol after a past usage of about 10.0 standard drinks of alcohol per week. He reports that he does not use drugs.  Allergies  Allergen Reactions   Atorvastatin Other (See Comments)    Made the hands ACHE   Cymbalta [Duloxetine Hcl] Nausea Only and Other (See Comments)    Drowsiness, also   Penicillins Hives    Family History  Problem Relation Age of Onset   Brain cancer Mother    Lung cancer Father    Heart disease Brother        No details   Dementia Brother    Neuropathy Neg Hx    Colon cancer Neg Hx    Colon polyps Neg Hx    Esophageal cancer Neg Hx    Rectal cancer Neg Hx    Stomach cancer Neg Hx    Sleep apnea Neg Hx     Prior to Admission medications   Medication Sig Start Date End Date Taking? Authorizing Provider  DENTA 5000 PLUS 1.1 % CREA dental cream Take by mouth at bedtime. 06/08/23  Yes [provider]  acyclovir (ZOVIRAX) 400 MG tablet Take 1 tablet (400 mg total) by mouth daily. 06/09/23  Yes Gorsuch, Ni, MD  albuterol (VENTOLIN HFA) 108 (90 Base) MCG/ACT inhaler Inhale 1-2 puffs into the lungs every 6 (six) hours as needed for wheezing or shortness of breath.   Yes [provider]  aspirin EC 325 MG tablet Take 1 tablet (325 mg total) by mouth daily. 06/18/23   [name], MD  brimonidine (ALPHAGAN) 0.2 % ophthalmic solution Place 1 drop into the left eye in the morning and at bedtime. 01/08/20   [provider]  carvedilol (COREG) 3.125 MG tablet TAKE 1 TABLET BY MOUTH TWICE DAILY 05/24/23   [name], MD  cetirizine (ZYRTEC) 10 MG tablet Take 10 mg by mouth every morning.    [provider]  Cholecalciferol (VITAMIN D3) 50 MCG (2000 UT) TABS Take 2,000 Units by mouth daily.    [provider]  cyclobenzaprine (FLEXERIL) 10 MG tablet Take 10 mg by mouth 3 (three) times daily as needed for muscle  spasms.    [provider]  dexamethasone (DECADRON) 4 MG tablet Take 4 pills once a week on Fridays. 07/23/23   [name], MD  docusate sodium (COLACE) 100 MG capsule Take 100-200 mg by mouth 2 (two) times daily as needed for mild constipation.    [provider]  dorzolamide-timolol (COSOPT) 22.3-6.8 MG/ML ophthalmic solution Place 1 drop into both eyes 2 (two) times daily. 03/09/18   [provider]  furosemide (LASIX) 40 MG tablet Take 40 mg in the morning and 20 mg in the afternoon 07/16/23   [name], MD  ketoconazole (NIZORAL) 2 % cream Apply 1 application  topically daily as needed for irritation. 11/28/14   [provider]  latanoprost (XALATAN) 0.005 % ophthalmic solution Place 1 drop into both eyes at bedtime. 03/01/13   [provider]  lenalidomide (REVLIMID) 10 MG capsule Take 1 capsule (10 mg total) by mouth daily. Take 1 capsule daily for 14 days on and 7 days off, repeat every 21 days. [employer] Auth #  [id number] Date Obtained 07/09/23 07/09/23   [name], MD  lidocaine (XYLOCAINE) 5 % ointment Apply 1 Application topically 2 (two) times daily as needed. Patient taking differently: Apply 1 Application topically 2 (two) times daily as needed (for foot pain). 09/29/22   Butch Penny, NP  magnesium oxide (MAG-OX) 400 (240 Mg) MG tablet Take 400 mg by mouth daily.    [provider]  Multiple Vitamins-Minerals (CENTRUM SILVER 50+MEN) TABS Take 1 tablet by mouth daily with breakfast.    [provider]  nitroGLYCERIN (NITROSTAT) 0.4 MG SL tablet Place 1 tablet (0.4 mg total) under the tongue every 5 (five) minutes as needed. Patient taking differently: Place 0.4 mg under the tongue every 5 (five) minutes as needed for chest pain. 10/22/21   Arty Baumgartner, NP  Omega-3 Fatty Acids (FISH OIL) 1200 MG CAPS Take 3,600 mg by mouth daily. 3    [provider]  ondansetron (ZOFRAN) 8 MG tablet Take 1 tablet (8 mg total) by  mouth every 8 (eight) hours as needed for nausea or vomiting. 05/11/23   [name], MD  Oxycodone HCl 10 MG TABS Take 1 tablet (10 mg total) by mouth every 4 (four) hours as needed. 07/02/23   [name], MD  pantoprazole (PROTONIX) 40 MG tablet Take 40 mg by mouth daily before breakfast. 03/14/13   [provider]  polyethylene glycol (MIRALAX / GLYCOLAX) 17 g packet Take 17 g by mouth daily. 05/08/23   Lewie Chamber, MD  potassium chloride SA (KLOR-CON M20) 20 MEQ tablet Take 1 tablet (20 mEq total) by mouth daily. 07/12/23   Jodelle Gross, NP  pregabalin (LYRICA) 100 MG capsule TAKE 1 CAPSULE BY MOUTH IN THE MORNING, AND AT NOON, AND 2 CAPSULES AT NIGHT Patient taking differently: Take 100 mg by mouth in the morning and at bedtime. 02/17/23   Ocie Doyne, MD  prochlorperazine (COMPAZINE) 10 MG tablet Take 1 tablet (10 mg total) by mouth every 6 (six) hours as needed for nausea or vomiting. 05/11/23   [name], MD  rosuvastatin (CRESTOR) 40 MG tablet TAKE 1 TABLET BY MOUTH DAILY 04/19/23   [name], MD  senna-docusate (SENOKOT-S) 8.6-50 MG tablet Take 1 tablet by mouth 2 (two) times daily. 05/07/23   Lewie Chamber, MD  silver sulfADIAZINE (SILVADENE) 1 % cream Apply 1 Application topically as needed. 07/06/23   [provider]  spironolactone (ALDACTONE) 25 MG tablet Take 1 tablet (25 mg total) by mouth in the morning. 07/21/23   [name], MD  triamcinolone (NASACORT) 55 MCG/ACT AERO nasal inhaler Place 2 sprays into the nose daily. 06/03/23   [name], MD  TYLENOL 500 MG tablet Take 500-1,000 mg by mouth every 6 (six) hours as needed for mild pain (pain score 1-3) or headache.    [provider]    Physical Exam: Vitals:   07/23/23 0912 07/23/23 0913 07/23/23 1030  BP: 126/66  114/65  Pulse: 77  67  Resp: 20  11  Temp: 98 F (36.7 C)    SpO2: 93% 93% 93%   Physical Exam Vitals and nursing note reviewed.  Constitutional:      General: He  is awake. He is not in acute distress.    Appearance: He is well-developed. He is obese. He  is ill-appearing.     Interventions: Nasal cannula in place.  HENT:     Head: Normocephalic.     Nose: No rhinorrhea.     Mouth/Throat:     Mouth: Mucous membranes are moist.  Eyes:     General: No scleral icterus.    Pupils: Pupils are equal, round, and reactive to light.  Neck:     Vascular: No JVD.  Cardiovascular:     Rate and Rhythm: Normal rate and regular rhythm.     Heart sounds: S1 normal and S2 normal.  Pulmonary:     Effort: Tachypnea present.     Breath sounds: Wheezing and rhonchi present. No rales.  Abdominal:     General: Abdomen is protuberant. Bowel sounds are normal. There is no distension.     Palpations: Abdomen is soft.     Tenderness: There is no abdominal tenderness. There is no right CVA tenderness or left CVA tenderness.  Musculoskeletal:     Cervical back: Neck supple.     Right lower leg: 2+ Edema present.     Left lower leg: 2+ Edema present.  Skin:    General: Skin is warm and dry.     Coloration: Skin is not jaundiced.  Neurological:     General: No focal deficit present.     Mental Status: He is alert and oriented to person, place, and time.  Psychiatric:        Mood and Affect: Mood normal.        Behavior: Behavior normal. Behavior is cooperative.     Data Reviewed:  Results are pending, will review when available. 07/20/2023 transthoracic echocardiogram. IMPRESSIONS:   1. Limited study for LV function; not all views obtained; MR not well  assessed.   2. Left ventricular ejection fraction, by estimation, is 60 to 65%. The  left ventricle has normal function. The left ventricle has no regional  wall motion abnormalities. Left ventricular diastolic parameters were  normal.   3. Right ventricular systolic function is normal. The right ventricular  size is normal.   4. The mitral valve is normal in structure. Mild to moderate mitral valve   regurgitation. No evidence of mitral stenosis.   5. The aortic valve is tricuspid. Aortic valve regurgitation is not  visualized. Aortic valve sclerosis is present, with no evidence of aortic  valve stenosis.   6. Aortic dilatation noted. There is mild dilatation of the ascending  aorta, measuring 43 mm.   EKG: Vent. rate 73 BPM PR interval 177 ms QRS duration 99 ms QT/QTcB 395/436 ms P-R-T axes 46 -6 18 Sinus rhythm No significant change since last tracing  Assessment and Plan: Principal Problem:   Acute respiratory failure with hypoxia (HCC) In the setting of:   URI (upper respiratory infection) With resulting:   Reactive airway disease with wheezing Superimposed on existing:   Fluid overload Observation/telemetry Continue supplemental oxygen. Methylprednisolone 125 mg IVP x1. Followed by prednisone 40 mg p.o. daily in a.m. Scheduled and as needed bronchodilators. Follow-up CBC and chemistry in the morning.   Active Problems:   Multiple myeloma without remission [facility] Following with oncology.    Class 1 obesity due to excess calories in adult Current BMI 31.17 kg/m. Has lost weight due to above. Follow-up with primary care provider.    CAD (coronary artery disease) Continue aspirin and carvedilol. He is allergic to statin. Sublingual nitroglycerin as needed. Follow-up with cardiology as an outpatient.    Hyperlipidemia Intolerant to statins.  Essential hypertension Continue diuretic and beta-blocker.    Pressure injury of skin Continue local care.    Glaucoma  Continue regular home drops. Follow-up with ophthalmology as an outpatient.    Advance Care Planning:   Code Status: Full Code   Consults:   Family Communication:   Severity of Illness: The appropriate patient status for this patient is OBSERVATION. Observation status is judged to be reasonable and necessary in order to provide the required intensity of service to ensure the patient's  safety. The patient's presenting symptoms, physical exam findings, and initial radiographic and laboratory data in the context of their medical condition is felt to place them at decreased risk for further clinical deterioration. Furthermore, it is anticipated that the patient will be medically stable for discharge from the hospital within 2 midnights of admission.   Author: Bobette Mo, MD 07/23/2023 12:22 PM  For on call review [email or web address].   This document was prepared using Dragon voice recognition software and may contain some unintended transcription errors.

## 2023-07-23 NOTE — Assessment & Plan Note (Signed)
Recent myeloma panel showed positive response to therapy We will continue treatment as prescribed He will only take dexamethasone once a week, due to fluid retention, he will reduce it to 60 mg once a week He will continue acyclovir for antimicrobial prophylaxis He will continue aspirin for DVT prophylaxis He will not get Zometa yet until he get dental clearance We will hold lenalidomide this week due to persistent anemia

## 2023-07-23 NOTE — Progress Notes (Signed)
[facility] OFFICE PROGRESS NOTE  Patient Care Team: [name], MD as PCP - General (Family Medicine) [name], MD as PCP - Cardiology (Cardiology)  ASSESSMENT & PLAN:  Multiple myeloma without remission [facility]) Recent myeloma panel showed positive response to therapy We will continue treatment as prescribed He will only take dexamethasone once a week, due to fluid retention, he will reduce it to 60 mg once a week He will continue acyclovir for antimicrobial prophylaxis He will continue aspirin for DVT prophylaxis He will not get Zometa yet until he get dental clearance We will hold lenalidomide this week due to persistent anemia  Fluid overload He has clinical signs and symptoms of fluid overload His recent repeat BNP is high Recent limited echocardiogram show preserved left ventricular ejection fraction but with limited views In comparison with his previous echocardiogram in 2022, it is not clear whether he has persistent grade 1 diastolic dysfunction or not His clinical exam showed left lung crackles with bilateral lower extremity edema He is not responding to higher dose of furosemide and I just added spironolactone 2 days ago I recommend ER evaluation The patient might need intravenous diuretic therapy and further evaluation by cardiologist  Anemia, chronic disease This is likely anemia of chronic disease and from recent renal failure and chemotherapy. The patient denies recent history of bleeding such as epistaxis, hematuria or hematochezia. He is asymptomatic from the anemia. We will observe for now.  I will hold his lenalidomide for now given his hypoxia  No orders of the defined types were placed in this encounter.   All questions were answered. The patient knows to call the clinic with any problems, questions or concerns. The total time spent in the appointment was 40 minutes encounter with patients including review of chart and various tests  results, discussions about plan of care and coordination of care plan   [name], MD 07/23/2023 8:59 AM  INTERVAL HISTORY: Please see below for problem oriented charting. he returns for chemo follow-up He is here accompanied by his family He is getting worse He was evaluated at symptom management clinic 3 days ago for signs and symptoms of fluid overload I just added spironolactone 2 days ago He continues to have progressive weight gain, bilateral lower extremity edema, shortness of breath, chest tightness and worsening difficulties of breathing No recent fever or chills  REVIEW OF SYSTEMS:   Constitutional: Denies fevers, chills or abnormal weight loss Eyes: Denies blurriness of vision Ears, nose, mouth, throat, and face: Denies mucositis or sore throat Gastrointestinal:  Denies nausea, heartburn or change in bowel habits Skin: Denies abnormal skin rashes Lymphatics: Denies new lymphadenopathy or easy bruising Neurological:Denies numbness, tingling or new weaknesses Behavioral/Psych: Mood is stable, no new changes  All other systems were reviewed with the patient and are negative.  I have reviewed the past medical history, past surgical history, social history and family history with the patient and they are unchanged from previous note.  ALLERGIES:  is allergic to atorvastatin, cymbalta [duloxetine hcl], and penicillins.  MEDICATIONS:  Current Outpatient Medications  Medication Sig Dispense Refill   acyclovir (ZOVIRAX) 400 MG tablet Take 1 tablet (400 mg total) by mouth daily. 30 tablet 6   albuterol (VENTOLIN HFA) 108 (90 Base) MCG/ACT inhaler Inhale 1-2 puffs into the lungs every 6 (six) hours as needed for wheezing or shortness of breath.     aspirin EC 325 MG tablet Take 1 tablet (325 mg total) by mouth daily.  brimonidine (ALPHAGAN) 0.2 % ophthalmic solution Place 1 drop into the left eye in the morning and at bedtime.     carvedilol (COREG) 3.125 MG tablet TAKE 1  TABLET BY MOUTH TWICE DAILY 180 tablet 3   cetirizine (ZYRTEC) 10 MG tablet Take 10 mg by mouth every morning.     Cholecalciferol (VITAMIN D3) 50 MCG (2000 UT) TABS Take 2,000 Units by mouth daily.     cyclobenzaprine (FLEXERIL) 10 MG tablet Take 10 mg by mouth 3 (three) times daily as needed for muscle spasms.     dexamethasone (DECADRON) 4 MG tablet Take 4 pills once a week on Fridays.     docusate sodium (COLACE) 100 MG capsule Take 100-200 mg by mouth 2 (two) times daily as needed for mild constipation.     dorzolamide-timolol (COSOPT) 22.3-6.8 MG/ML ophthalmic solution Place 1 drop into both eyes 2 (two) times daily.     furosemide (LASIX) 40 MG tablet Take 40 mg in the morning and 20 mg in the afternoon 60 tablet 1   ketoconazole (NIZORAL) 2 % cream Apply 1 application  topically daily as needed for irritation.     latanoprost (XALATAN) 0.005 % ophthalmic solution Place 1 drop into both eyes at bedtime.     lenalidomide (REVLIMID) 10 MG capsule Take 1 capsule (10 mg total) by mouth daily. Take 1 capsule daily for 14 days on and 7 days off, repeat every 21 days. [employer] Auth #  [id number] Date Obtained 07/09/23 14 capsule 0   lidocaine (XYLOCAINE) 5 % ointment Apply 1 Application topically 2 (two) times daily as needed. (Patient taking differently: Apply 1 Application topically 2 (two) times daily as needed (for foot pain).) 35.44 g 1   magnesium oxide (MAG-OX) 400 (240 Mg) MG tablet Take 400 mg by mouth daily.     Multiple Vitamins-Minerals (CENTRUM SILVER 50+MEN) TABS Take 1 tablet by mouth daily with breakfast.     nitroGLYCERIN (NITROSTAT) 0.4 MG SL tablet Place 1 tablet (0.4 mg total) under the tongue every 5 (five) minutes as needed. (Patient taking differently: Place 0.4 mg under the tongue every 5 (five) minutes as needed for chest pain.) 25 tablet 2   Omega-3 Fatty Acids (FISH OIL) 1200 MG CAPS Take 3,600 mg by mouth daily. 3     ondansetron (ZOFRAN) 8 MG tablet Take 1 tablet (8 mg total)  by mouth every 8 (eight) hours as needed for nausea or vomiting. 30 tablet 1   Oxycodone HCl 10 MG TABS Take 1 tablet (10 mg total) by mouth every 4 (four) hours as needed. 90 tablet 0   pantoprazole (PROTONIX) 40 MG tablet Take 40 mg by mouth daily before breakfast.     polyethylene glycol (MIRALAX / GLYCOLAX) 17 g packet Take 17 g by mouth daily.     potassium chloride SA (KLOR-CON M20) 20 MEQ tablet Take 1 tablet (20 mEq total) by mouth daily. 90 tablet 3   pregabalin (LYRICA) 100 MG capsule TAKE 1 CAPSULE BY MOUTH IN THE MORNING, AND AT NOON, AND 2 CAPSULES AT NIGHT (Patient taking differently: Take 100 mg by mouth in the morning and at bedtime.) 120 capsule 5   prochlorperazine (COMPAZINE) 10 MG tablet Take 1 tablet (10 mg total) by mouth every 6 (six) hours as needed for nausea or vomiting. 30 tablet 1   rosuvastatin (CRESTOR) 40 MG tablet TAKE 1 TABLET BY MOUTH DAILY 90 tablet 0   senna-docusate (SENOKOT-S) 8.6-50 MG tablet Take 1 tablet by mouth  2 (two) times daily.     silver sulfADIAZINE (SILVADENE) 1 % cream Apply 1 Application topically as needed.     spironolactone (ALDACTONE) 25 MG tablet Take 1 tablet (25 mg total) by mouth in the morning. 30 tablet 0   triamcinolone (NASACORT) 55 MCG/ACT AERO nasal inhaler Place 2 sprays into the nose daily. 1 each 12   TYLENOL 500 MG tablet Take 500-1,000 mg by mouth every 6 (six) hours as needed for mild pain (pain score 1-3) or headache.     No current facility-administered medications for this visit.    SUMMARY OF ONCOLOGIC HISTORY: Oncology History Overview Note  Myeloma FISH was neg Normal cytogenetics   Multiple myeloma without remission (HCC)  07/11/2013 Initial Diagnosis   Multiple myeloma without remission (HCC)   04/22/2023 PET scan   NM PET Image Initial (PI) Whole Body (F-18 FDG)  Result Date: 05/03/2023 CLINICAL DATA:  Subsequent treatment strategy for multiple myeloma. EXAM: NUCLEAR MEDICINE PET WHOLE BODY TECHNIQUE: 13 mCi  F-18 FDG was injected intravenously. Full-ring PET imaging was performed from the head to foot after the radiotracer. CT data was obtained and used for attenuation correction and anatomic localization. Fasting blood glucose: 114 mg/dl COMPARISON:  None Available. FINDINGS: Mediastinal blood pool activity: SUV max 2.0 HEAD/NECK: No hypermetabolic activity in the scalp. No hypermetabolic cervical lymph nodes. Incidental CT findings: Is CHEST: No hypermetabolic mediastinal or hilar nodes. No suspicious pulmonary nodules on the CT scan. Incidental CT findings: none ABDOMEN/PELVIS: No abnormal hypermetabolic activity within the liver, pancreas, adrenal glands, or spleen. No hypermetabolic lymph nodes in the abdomen or pelvis. Incidental CT findings: none SKELETON: There multiple foci focal radiotracer activity within the skeleton. Lesions are accompaniedlytic change on CT portion exam. There approximately 50 lesions. For example lesion the posterior RIGHT fifth rib with SUV max equal 3.6 on image 120. There is a lucent lesion and pathologic fracture associated with hypermetabolic activity the posterior LEFT sixth rib with SUV max equal 6.4 on image 129. Multiple lesions throughout the spine. Most intense lesion spinal lesion has associated soft tissue expansion at the the T2 vertebral body measuring 2.5 cm (107) with SUV max equal 7.3 Lesion in the inferior LEFT pubic rami subtle lucency on image 299. Suspicious lesions in LEFT and RIGHT femoral necks. Incidental CT findings: none EXTREMITIES: Hypermetabolic lesions RIGHT humeral shaft. Lesion the proximal LEFT and RIGHT femurs. Incidental CT findings: none IMPRESSION: 1. Multiple hypermetabolic lytic lesions throughout the skeleton consistent with multiple myeloma. Approximately 50 lesions within the axillary and appendicular skeleton. Many lesions accompanied lytic/lucent lesions on CT. 2. No evidence of soft tissue plasmacytoma. Electronically Signed   By: Genevive Bi M.D.   On: 05/03/2023 15:16   CT Chest W Contrast  Result Date: 05/03/2023 CLINICAL DATA:  Pneumonia with complication suspected. X-ray done. Cough with rib pain after coughing. Shortness of breath and right-sided pain. History of multiple myeloma. EXAM: CT CHEST WITH CONTRAST TECHNIQUE: Multidetector CT imaging of the chest was performed during intravenous contrast administration. RADIATION DOSE REDUCTION: This exam was performed according to the departmental dose-optimization program which includes automated exposure control, adjustment of the mA and/or kV according to patient size and/or use of iterative reconstruction technique. CONTRAST:  60mL OMNIPAQUE IOHEXOL 300 MG/ML  SOLN COMPARISON:  Chest radiograph 05/02/2023.  PET-CT 04/22/2023 FINDINGS: Cardiovascular: Mild cardiac enlargement. No pericardial effusions. Normal caliber thoracic aorta. Calcification of the aorta and coronary arteries. Mediastinum/Nodes: Thyroid gland is unremarkable. Esophagus is decompressed. Scattered mediastinal lymph  nodes are not pathologically enlarged. Lungs/Pleura: Motion artifact limits examination. There is evidence of atelectasis or consolidation in both lung bases. Small bilateral pleural effusions. Changes could represent pneumonia. No pneumothorax. Upper Abdomen: No acute abnormalities. Musculoskeletal: Degenerative changes in the spine and shoulders. Multiple lucent lesions demonstrated in the ribs, spine, and shoulders consistent with history of myeloma. Fractures of the right lateral fifth, sixth, and seventh ribs as well as the right tenth rib. Fractures of the left third, fourth, and sixth ribs. No vertebral compression deformities are demonstrated although large lucent lesions are demonstrated in the T1 and T2 vertebrae. IMPRESSION: 1. Multiple lucent lesions demonstrated in the ribs, spine, and shoulders corresponding to the patient's history of multiple myeloma. 2. Multiple bilateral acute rib  fractures. 3. Small bilateral pleural effusions with basilar atelectasis or consolidation, possibly pneumonia. Electronically Signed   By: Burman Nieves M.D.   On: 05/03/2023 01:21   DG Chest Port 1 View  Result Date: 05/02/2023 CLINICAL DATA:  Shortness of breath and cough. History of multiple myeloma. Right-sided pain. EXAM: PORTABLE CHEST 1 VIEW COMPARISON:  04/20/2023 FINDINGS: Shallow inspiration. Cardiac enlargement. No vascular congestion or edema. Linear atelectasis in the lung bases. No pleural effusions. No pneumothorax. Calcification of the aorta. A nondisplaced right lateral ninth rib fracture is suggested. IMPRESSION: Shallow inspiration with atelectasis in the lung bases. Cardiac enlargement. Probable right ninth rib fracture. Electronically Signed   By: Burman Nieves M.D.   On: 05/02/2023 23:45   CT BONE MARROW BIOPSY & ASPIRATION  Result Date: 04/27/2023 INDICATION: myeloma staging EXAM: CT GUIDED BONE MARROW ASPIRATION AND CORE BIOPSY MEDICATIONS: None. ANESTHESIA/SEDATION: Moderate (conscious) sedation was employed during this procedure. A total of Versed 2 mg and Fentanyl 100 mcg was administered intravenously. Moderate Sedation Time: 13 minutes. The patient's level of consciousness and vital signs were monitored continuously by radiology nursing throughout the procedure under my direct supervision. FLUOROSCOPY TIME:  CT dose; 122 mGycm COMPLICATIONS: None immediate. Estimated blood loss: <5 mL PROCEDURE: RADIATION DOSE REDUCTION: This exam was performed according to the departmental dose-optimization program which includes automated exposure control, adjustment of the mA and/or kV according to patient size and/or use of iterative reconstruction technique. Informed written consent was obtained from the patient after a thorough discussion of the procedural risks, benefits and alternatives. All questions were addressed. Maximal Sterile Barrier Technique was utilized including caps,  mask, sterile gowns, sterile gloves, sterile drape, hand hygiene and skin antiseptic. A timeout was performed prior to the initiation of the procedure. The patient was positioned prone and non-contrast localization CT was performed of the pelvis to demonstrate the iliac marrow spaces. Maximal barrier sterile technique utilized including caps, mask, sterile gowns, sterile gloves, large sterile drape, hand hygiene, and chlorhexidine prep. Under sterile conditions and local anesthesia, an 11 gauge coaxial bone biopsy needle was advanced into the RIGHT iliac marrow space. Needle position was confirmed with CT imaging. Initially, bone marrow aspiration was performed. Next, the 11 gauge outer cannula was utilized to obtain a 1 iliac bone marrow core biopsy. Needle was removed. Hemostasis was obtained with compression. The patient tolerated the procedure well. Samples were prepared with the cytotechnologist. IMPRESSION: Successful CT-guided bone marrow aspiration and biopsy. Roanna Banning, MD [facility] Radiology Electronically Signed   By: Roanna Banning M.D.   On: 04/27/2023 12:32   DG Chest 2 View  Result Date: 04/23/2023 CLINICAL DATA:  Cough for 2 months Pain between shoulder blades EXAM: CHEST - 2  VIEW COMPARISON:  08/24/2019 FINDINGS: Cardiomediastinal silhouette and pulmonary vasculature are within normal limits. Lungs are hyperexpanded but otherwise clear. Unchanged dextroconvex curvature of the thoracolumbar spine. IMPRESSION: 1. No acute cardiopulmonary process. 2. Hyperexpanded lungs, suspicious for emphysema. Electronically Signed   By: Acquanetta Belling M.D.   On: 04/23/2023 16:44      04/27/2023 Pathology Results   Surgical Pathology CASE: [id number] PATIENT: Stephanie Coup Bone Marrow Report  Clinical History: Myeloma  DIAGNOSIS:  BONE MARROW, ASPIRATE, CLOT, CORE: -Hypercellular bone marrow with plasma cell neoplasm -See comment  PERIPHERAL  BLOOD: -Normocytic-normochromic anemia -Leukocytosis with neutrophilic left shift  COMMENT:  The bone marrow is hypercellular for age with increased number of plasma cells averaging 15% of all cells in the aspirate.  The clot and biopsy sections show interstitial infiltrates, numerous variably sized clusters in addition to an area of diffuse sheet of atypical plasma cells associated with kappa light chain restriction consistent with plasma cell neoplasm.  Correlation with cytogenetic and FISH studies is recommended.  MICROSCOPIC DESCRIPTION:  PERIPHERAL BLOOD SMEAR: The red blood cells display mild anisopoikilocytosis with mild to moderate polychromasia.  The white blood cells are slightly increased in number, mostly with neutrophils some of which display toxic granulation.  An occasional myelocyte is seen on scan.  The platelets are normal in number.  BONE MARROW ASPIRATE: Bone marrow particles present Erythroid precursors: Orderly and progressive maturation Granulocytic precursors: Orderly and progressive maturation for the most part Megakaryocytes: Abundant with scattered small and/or hypolobated forms or large cells. Lymphocytes/plasma cells: The plasma cells are increased in number representing 15% to all cells associated with atypical cytologic features characterized by cytomegaly and/or small nucleoli.  Large lymphoid aggregates are not seen.  TOUCH PREPARATIONS: A mixture of cell types present but with increased number of plasma cells  CLOT AND BIOPSY: The sections show variable cellularity ranging from 40 to 90% focally.  There are numerous variably sized clusters of atypical plasma cells in addition to a diffuse large sheet particularly in the area of most cellularity.  The background shows trilineage hematopoiesis.  Immunohistochemical stains for CD138 in addition to in situ hybridization for kappa and lambda were performed with appropriate controls.  CD138 highlights the increased  plasma cell component consisting of interstitial infiltrates, numerous variably sized clusters, and an area of diffuse sheet particularly in the core biopsy. The plasma cells display kappa light chain restriction.  IRON STAIN: Iron stains are performed on a bone marrow aspirate or touch imprint smear and section of clot. The controls stained appropriately.       Storage Iron: Decreased      Ring Sideroblasts: Absent  ADDITIONAL DATA/TESTING: The specimen was sent for cytogenetic analysis and FISH for multiple myeloma and a separate report will follow.    05/04/2023 Cancer Staging   Staging form: Plasma Cell Myeloma and Plasma Cell Disorders, AJCC 8th Edition - Clinical stage from 05/04/2023: RISS Stage II (Beta-2-microglobulin (mg/L): 5.6, Albumin (g/dL): 2.5, ISS: Stage III, High-risk cytogenetics: Absent, LDH: Normal) - Signed by [name], MD on 05/11/2023 Stage prefix: Initial diagnosis Beta 2 microglobulin range (mg/L): Greater than or equal to 5.5 Albumin range (g/dL): Less than 3.5 Cytogenetics: No abnormalities   05/14/2023 -  Chemotherapy   Patient is on Treatment Plan : MYELOMA NEWLY DIAGNOSED TRANSPLANT CANDIDATE DaraVRd (Daratumumab SQ) q21d x 6 Cycles (Induction/Consolidation)     06/18/2023 Imaging   RIGHT:         - There is no evidence of deep vein  thrombosis in the lower extremity.   - No cystic structure found in the popliteal fossa.   LEFT: - No evidence of common femoral vein obstruction.         PHYSICAL EXAMINATION: ECOG PERFORMANCE STATUS: 2 - Symptomatic, <50% confined to bed  Vitals:   07/23/23 0842  BP: (!) 131/52  Pulse: 81  Resp: 18  Temp: (!) 97.5 F (36.4 C)  SpO2: 97%   Filed Weights   07/23/23 0842  Weight: 229 lb 12.8 oz (104.2 kg)    GENERAL:alert, no distress and comfortable SKIN: skin color, texture, turgor are normal, no rashes or significant lesions EYES: normal, Conjunctiva are pink and non-injected, sclera  clear OROPHARYNX:no exudate, no erythema and lips, buccal mucosa, and tongue normal  NECK: supple, thyroid normal size, non-tender, without nodularity LYMPH:  no palpable lymphadenopathy in the cervical, axillary or inguinal LUNGS: He has oxygen delivery via nasal cannula.  He has bilateral crackles more so on the left lung base with increased breathing effort HEART: regular rate & rhythm and no murmurs with moderate bilateral lower extremity edema ABDOMEN:abdomen soft, non-tender and normal bowel sounds Musculoskeletal:no cyanosis of digits and no clubbing  NEURO: alert & oriented x 3 with fluent speech, no focal motor/sensory deficits  LABORATORY DATA:  I have reviewed the data as listed    Component Value Date/Time   NA 138 07/23/2023 0820   NA 139 01/12/2017 1011   K 4.3 07/23/2023 0820   K 4.3 01/12/2017 1011   CL 104 07/23/2023 0820   CO2 29 07/23/2023 0820   CO2 23 01/12/2017 1011   GLUCOSE 94 07/23/2023 0820   GLUCOSE 98 01/12/2017 1011   BUN 24 (H) 07/23/2023 0820   BUN 21.8 01/12/2017 1011   CREATININE 0.84 07/23/2023 0820   CREATININE 0.9 01/12/2017 1011   CALCIUM 9.5 07/23/2023 0820   CALCIUM 9.0 01/12/2017 1011   PROT 7.4 07/23/2023 0820   PROT 6.4 01/12/2017 1011   PROT 6.1 01/12/2017 1011   ALBUMIN 3.2 (L) 07/23/2023 0820   ALBUMIN 3.6 01/12/2017 1011   AST 12 (L) 07/23/2023 0820   AST 20 01/12/2017 1011   ALT 10 07/23/2023 0820   ALT 18 01/12/2017 1011   ALKPHOS 147 (H) 07/23/2023 0820   ALKPHOS 81 01/12/2017 1011   BILITOT 0.2 07/23/2023 0820   BILITOT 0.58 01/12/2017 1011   GFRNONAA >60 07/23/2023 0820   GFRAA >60 02/14/2020 1115    No results found for: "SPEP", "UPEP"  Lab Results  Component Value Date   WBC 7.2 07/23/2023   NEUTROABS 4.2 07/23/2023   HGB 8.6 (L) 07/23/2023   HCT 28.2 (L) 07/23/2023   MCV 89.5 07/23/2023   PLT 391 07/23/2023      Chemistry      Component Value Date/Time   NA 138 07/23/2023 0820   NA 139 01/12/2017 1011    K 4.3 07/23/2023 0820   K 4.3 01/12/2017 1011   CL 104 07/23/2023 0820   CO2 29 07/23/2023 0820   CO2 23 01/12/2017 1011   BUN 24 (H) 07/23/2023 0820   BUN 21.8 01/12/2017 1011   CREATININE 0.84 07/23/2023 0820   CREATININE 0.9 01/12/2017 1011      Component Value Date/Time   CALCIUM 9.5 07/23/2023 0820   CALCIUM 9.0 01/12/2017 1011   ALKPHOS 147 (H) 07/23/2023 0820   ALKPHOS 81 01/12/2017 1011   AST 12 (L) 07/23/2023 0820   AST 20 01/12/2017 1011   ALT 10 07/23/2023 0820  ALT 18 01/12/2017 1011   BILITOT 0.2 07/23/2023 0820   BILITOT 0.58 01/12/2017 1011       RADIOGRAPHIC STUDIES: I have personally reviewed the radiological images as listed and agreed with the findings in the report. ECHOCARDIOGRAM LIMITED Result Date: 07/20/2023    ECHOCARDIOGRAM LIMITED REPORT   Patient Name:   Daniel Whitaker Date of Exam: 07/20/2023 Medical Rec #:  [id number]        Height:       72.0 in Accession #:    [id number]       Weight:       232.0 lb Date of Birth:  02-07-1946       BSA:          2.269 m Patient Age:    77 years         BP:           160/68 mmHg Patient Gender: M                HR:           78 bpm. Exam Location:  [address] Procedure: Limited Echo, Cardiac Doppler, Limited Color Doppler and 3D Echo Indications:    I50.9 CHF  History:        Patient has prior history of Echocardiogram examinations, most                 recent 11/05/2020. CAD, Signs/Symptoms:Dyspnea and Fatigue; Risk                 Factors:HLD.  Sonographer:    Clearence Ped RCS Referring Phys: [id number] Bettey Mare [place] IMPRESSIONS  1. Limited study for LV function; not all views obtained; MR not well assessed.  2. Left ventricular ejection fraction, by estimation, is 60 to 65%. The left ventricle has normal function. The left ventricle has no regional wall motion abnormalities. Left ventricular diastolic parameters were normal.  3. Right ventricular systolic function is normal. The right ventricular size is normal.  4.  The mitral valve is normal in structure. Mild to moderate mitral valve regurgitation. No evidence of mitral stenosis.  5. The aortic valve is tricuspid. Aortic valve regurgitation is not visualized. Aortic valve sclerosis is present, with no evidence of aortic valve stenosis.  6. Aortic dilatation noted. There is mild dilatation of the ascending aorta, measuring 43 mm. FINDINGS  Left Ventricle: Left ventricular ejection fraction, by estimation, is 60 to 65%. The left ventricle has normal function. The left ventricle has no regional wall motion abnormalities. The left ventricular internal cavity size was normal in size. There is  no left ventricular hypertrophy. Left ventricular diastolic parameters were normal. Right Ventricle: The right ventricular size is normal. Right ventricular systolic function is normal. Left Atrium: Left atrial size was normal in size. Right Atrium: Right atrial size was normal in size. Pericardium: There is no evidence of pericardial effusion. Mitral Valve: The mitral valve is normal in structure. Mild to moderate mitral valve regurgitation. No evidence of mitral valve stenosis. Tricuspid Valve: The tricuspid valve is normal in structure. Tricuspid valve regurgitation is mild . No evidence of tricuspid stenosis. Aortic Valve: The aortic valve is tricuspid. Aortic valve regurgitation is not visualized. Aortic valve sclerosis is present, with no evidence of aortic valve stenosis. Pulmonic Valve: The pulmonic valve was normal in structure. Aorta: Aortic dilatation noted. There is mild dilatation of the ascending aorta, measuring 43 mm. Additional Comments: Limited study for LV function; not all views obtained;  MR not well assessed.  LEFT VENTRICLE PLAX 2D LVIDd:         4.30 cm   Diastology LVIDs:         3.00 cm   LV e' medial:    9.90 cm/s LV PW:         1.20 cm   LV E/e' medial:  10.3 LV IVS:        1.00 cm   LV e' lateral:   14.30 cm/s LVOT diam:     2.10 cm   LV E/e' lateral: 7.1 LVOT  Area:     3.46 cm                           3D Volume EF:                          3D EF:        61 %                          LV EDV:       126 ml                          LV ESV:       49 ml                          LV SV:        77 ml RIGHT VENTRICLE RVSP:           35.0 mmHg LEFT ATRIUM         Index       RIGHT ATRIUM LA diam:    4.00 cm 1.76 cm/m  RA Pressure: 3.00 mmHg   AORTA Ao Root diam: 3.40 cm Ao Asc diam:  4.30 cm MITRAL VALVE                TRICUSPID VALVE MV Area (PHT):              TR Peak grad:   32.0 mmHg MV Decel Time:              TR Vmax:        283.00 cm/s MV E velocity: 102.00 cm/s  Estimated RAP:  3.00 mmHg MV A velocity: 88.60 cm/s   RVSP:           35.0 mmHg MV E/A ratio:  1.15                             SHUNTS                             Systemic Diam: 2.10 cm Olga Millers MD Electronically signed by Olga Millers MD Signature Date/Time: 07/20/2023/1:05:58 PM    Final    DG Chest 2 View Result Date: 07/19/2023 CLINICAL DATA:  Shortness of breath with peripheral edema EXAM: CHEST - 2 VIEW COMPARISON:  05/24/2023, PET CT 04/22/2023 FINDINGS: Cardiomegaly with small bilateral effusions, similar on the left slightly increased on the right. Mild sclerosis at left sixth rib, likely due to previously noted fracture. Possible acute left eighth posterolateral rib fracture compared to prior. Numerous additional remote fractures are noted. IMPRESSION: 1. Cardiomegaly with small  bilateral effusions, similar on the left slightly increased on the right. 2. Possible acute left eighth posterolateral rib fracture compared to prior. Multiple remote bilateral rib fractures Electronically Signed   By: Jasmine Pang M.D.   On: 07/19/2023 17:39

## 2023-07-23 NOTE — ED Provider Notes (Signed)
[name] EMERGENCY DEPARTMENT AT [facility] Provider Note   CSN: [id number] Arrival date & time: 07/23/23  4098     History  Chief Complaint  Patient presents with   Shortness of Breath   Hypoxia    Daniel Whitaker is a 78 y.o. adult.  Patient is a 78 year old male with a history of multiple myeloma currently undergoing therapy, CAD, who had an echo done not long ago which showed a preserved EF and relatively normal ventricle size with mild dilation of the aorta who is presenting today with worsening shortness of breath and lower extremity edema.  Patient has been checking his weights and reports that he still about 10 pounds heavier with fluid and in the last few days he has started to feel more short of breath.  He has oxygen at home that he can use but he was not needing it at all until this week.  For 2 to 3 weeks he started to have orthopnea but in the last week he has had dyspnea on exertion.  He does report at the beginning of this week he started developing congestion with a productive cough.  He did not have fever he did use a mucus reducing medication which has helped some but he continues to feel short of breath.  Now he is wearing his oxygen almost all the time and reports if he takes it off and walks his oxygen drops to 80% and he is significantly winded which takes a few minutes to resolve.  He has a tightness in his chest which she describes is not feeling like he can get enough air in.  He does not use inhalers at home.  He has had ongoing swelling in his bilateral lower legs.  He has been on 60 mg of Lasix for the last 2 weeks and then was started on spironolactone 2 days ago but reports it is not helping.  His family member reports he did get a dose of IV Lasix when he was in the office and he did urinate more that day but then it went back to his normal amount.  The history is provided by the patient, a relative and medical records.  Shortness of Breath       Home Medications Prior to Admission medications   Medication Sig Start Date End Date Taking? Authorizing Provider  acyclovir (ZOVIRAX) 400 MG tablet Take 1 tablet (400 mg total) by mouth daily. 06/09/23   [name], MD  albuterol (VENTOLIN HFA) 108 (90 Base) MCG/ACT inhaler Inhale 1-2 puffs into the lungs every 6 (six) hours as needed for wheezing or shortness of breath.    [provider]  aspirin EC 325 MG tablet Take 1 tablet (325 mg total) by mouth daily. 06/18/23   [name], MD  brimonidine (ALPHAGAN) 0.2 % ophthalmic solution Place 1 drop into the left eye in the morning and at bedtime. 01/08/20   [provider]  carvedilol (COREG) 3.125 MG tablet TAKE 1 TABLET BY MOUTH TWICE DAILY 05/24/23   [name], MD  cetirizine (ZYRTEC) 10 MG tablet Take 10 mg by mouth every morning.    [provider]  Cholecalciferol (VITAMIN D3) 50 MCG (2000 UT) TABS Take 2,000 Units by mouth daily.    [provider]  cyclobenzaprine (FLEXERIL) 10 MG tablet Take 10 mg by mouth 3 (three) times daily as needed for muscle spasms.    [provider]  dexamethasone (DECADRON) 4 MG tablet Take 4 pills  once a week on Fridays. 07/23/23   [name], MD  docusate sodium (COLACE) 100 MG capsule Take 100-200 mg by mouth 2 (two) times daily as needed for mild constipation.    [provider]  dorzolamide-timolol (COSOPT) 22.3-6.8 MG/ML ophthalmic solution Place 1 drop into both eyes 2 (two) times daily. 03/09/18   [provider]  furosemide (LASIX) 40 MG tablet Take 40 mg in the morning and 20 mg in the afternoon 07/16/23   [name], MD  ketoconazole (NIZORAL) 2 % cream Apply 1 application  topically daily as needed for irritation. 11/28/14   [provider]  latanoprost (XALATAN) 0.005 % ophthalmic solution Place 1 drop into both eyes at bedtime. 03/01/13   [provider]  lenalidomide (REVLIMID) 10 MG capsule Take 1 capsule (10 mg  total) by mouth daily. Take 1 capsule daily for 14 days on and 7 days off, repeat every 21 days. [employer] Auth #  [id number] Date Obtained 07/09/23 07/09/23   [name], MD  lidocaine (XYLOCAINE) 5 % ointment Apply 1 Application topically 2 (two) times daily as needed. Patient taking differently: Apply 1 Application topically 2 (two) times daily as needed (for foot pain). 09/29/22   Butch Penny, NP  magnesium oxide (MAG-OX) 400 (240 Mg) MG tablet Take 400 mg by mouth daily.    [provider]  Multiple Vitamins-Minerals (CENTRUM SILVER 50+MEN) TABS Take 1 tablet by mouth daily with breakfast.    [provider]  nitroGLYCERIN (NITROSTAT) 0.4 MG SL tablet Place 1 tablet (0.4 mg total) under the tongue every 5 (five) minutes as needed. Patient taking differently: Place 0.4 mg under the tongue every 5 (five) minutes as needed for chest pain. 10/22/21   Arty Baumgartner, NP  Omega-3 Fatty Acids (FISH OIL) 1200 MG CAPS Take 3,600 mg by mouth daily. 3    [provider]  ondansetron (ZOFRAN) 8 MG tablet Take 1 tablet (8 mg total) by mouth every 8 (eight) hours as needed for nausea or vomiting. 05/11/23   [name], MD  Oxycodone HCl 10 MG TABS Take 1 tablet (10 mg total) by mouth every 4 (four) hours as needed. 07/02/23   [name], MD  pantoprazole (PROTONIX) 40 MG tablet Take 40 mg by mouth daily before breakfast. 03/14/13   [provider]  polyethylene glycol (MIRALAX / GLYCOLAX) 17 g packet Take 17 g by mouth daily. 05/08/23   Lewie Chamber, MD  potassium chloride SA (KLOR-CON M20) 20 MEQ tablet Take 1 tablet (20 mEq total) by mouth daily. 07/12/23   Jodelle Gross, NP  pregabalin (LYRICA) 100 MG capsule TAKE 1 CAPSULE BY MOUTH IN THE MORNING, AND AT NOON, AND 2 CAPSULES AT NIGHT Patient taking differently: Take 100 mg by mouth in the morning and at bedtime. 02/17/23   Ocie Doyne, MD  prochlorperazine (COMPAZINE) 10 MG tablet Take 1 tablet (10 mg total) by  mouth every 6 (six) hours as needed for nausea or vomiting. 05/11/23   [name], MD  rosuvastatin (CRESTOR) 40 MG tablet TAKE 1 TABLET BY MOUTH DAILY 04/19/23   [name], MD  senna-docusate (SENOKOT-S) 8.6-50 MG tablet Take 1 tablet by mouth 2 (two) times daily. 05/07/23   Lewie Chamber, MD  silver sulfADIAZINE (SILVADENE) 1 % cream Apply 1 Application topically as needed. 07/06/23   [provider]  spironolactone (ALDACTONE) 25 MG tablet Take 1 tablet (25 mg total) by mouth in the morning. 07/21/23   [name], MD  triamcinolone (NASACORT) 55 MCG/ACT AERO nasal inhaler Place 2 sprays into the nose daily. 06/03/23   [name], MD  TYLENOL 500 MG tablet Take 500-1,000 mg by mouth every 6 (six) hours as needed for mild pain (pain score 1-3) or headache.    [provider]      Allergies    Atorvastatin, Cymbalta [duloxetine hcl], and Penicillins    Review of Systems   Review of Systems  Respiratory:  Positive for shortness of breath.     Physical Exam Updated Vital Signs BP 114/65   Pulse 67   Temp 98 F (36.7 C)   Resp 11   SpO2 93%  Physical Exam Vitals and nursing note reviewed.  Constitutional:      General: He is not in acute distress.    Appearance: He is well-developed.  HENT:     Head: Normocephalic and atraumatic.  Eyes:     Pupils: Pupils are equal, round, and reactive to light.  Cardiovascular:     Rate and Rhythm: Normal rate and regular rhythm.     Heart sounds: Normal heart sounds. No murmur heard.    No friction rub.  Pulmonary:     Effort: Pulmonary effort is normal. Tachypnea present.     Breath sounds: Examination of the left-middle field reveals decreased breath sounds. Examination of the left-lower field reveals decreased breath sounds. Decreased breath sounds and rales present. No wheezing.  Abdominal:     General: Bowel sounds are normal. There is no distension.     Palpations: Abdomen is soft.     Tenderness: There is no  abdominal tenderness. There is no guarding or rebound.  Musculoskeletal:        General: No tenderness. Normal range of motion.     Right lower leg: Edema present.     Left lower leg: Edema present.     Comments: Pitting edema noted in bilateral lower extremities to the mid shin  Skin:    General: Skin is warm and dry.     Findings: No rash.  Neurological:     Mental Status: He is alert and oriented to person, place, and time.     Cranial Nerves: No cranial nerve deficit.  Psychiatric:        Behavior: Behavior normal.     ED Results / Procedures / Treatments   Labs (all labs ordered are listed, but only abnormal results are displayed) Labs Reviewed  RESP PANEL BY RT-PCR (RSV, FLU A&B, COVID)  [id number]  BRAIN NATRIURETIC PEPTIDE  TROPONIN I (HIGH SENSITIVITY)    EKG EKG Interpretation Date/Time:  Friday July 23 2023 09:15:35 EST Ventricular Rate:  73 PR Interval:  177 QRS Duration:  99 QT Interval:  395 QTC Calculation: 436 R Axis:   -6  Text Interpretation: Sinus rhythm No significant change since last tracing Confirmed by Gwyneth Sprout ([id number]) on 07/23/2023 10:05:26 AM  Radiology DG Chest Port 1 View Result Date: 07/23/2023 CLINICAL DATA:  Shortness of breath and hypoxia EXAM: PORTABLE CHEST 1 VIEW COMPARISON:  Chest radiograph dated 07/19/2023 FINDINGS: Normal lung volumes. Mild bilateral interstitial opacities and bibasilar patchy and linear opacities. Blunting of the bilateral costophrenic angles. No pneumothorax. Similar enlarged cardiomediastinal silhouette. Old bilateral rib fractures. IMPRESSION: 1. Mild bilateral interstitial opacities and bibasilar patchy and linear opacities, which may represent pulmonary edema or atypical infection. 2. Blunting of the bilateral costophrenic angles, which may represent small pleural effusions. 3. Similar cardiomegaly. Electronically Signed   By: Agustin Cree  M.D.   On: 07/23/2023 10:45    Procedures Procedures     Medications Ordered in ED Medications  furosemide (LASIX) injection 60 mg (has no administration in time range)    ED Course/ Medical Decision Making/ A&P                                 Medical Decision Making Amount and/or Complexity of Data Reviewed External Data Reviewed: notes. Labs: ordered. Decision-making details documented in ED Course. Radiology: ordered and independent interpretation performed. Decision-making details documented in ED Course. ECG/medicine tests: ordered and independent interpretation performed. Decision-making details documented in ED Course.  Risk Prescription drug management. Decision regarding hospitalization.   Pt with multiple medical problems and comorbidities and presenting today with a complaint that caries a high risk for morbidity and mortality.  Here today with complaints of shortness of breath.  Also patient is having orthopnea and dyspnea on exertion.  This has been worsening despite increasing diuretics at home.  He had an echo which did have some poor windows but showed a preserved EF last week.  He did have a CBC and BMP done today in the office which showed stable hemoglobin of 8.5 and normal creatinine.  LFTs were normal.  Normal white count.  Concern for possible CHF versus infectious etiology as he has had more mucus production recently so possible viral etiology such as flu and COVID or pneumonia versus symptomatic pleural effusion versus PE.  Patient does appear fluid overloaded on exam.  Currently on continuous monitoring he is in a sinus rhythm with a normal rate and on 2 L of oxygen sats are 97% but he appears to get slightly winded even with speaking. I independently interpreted patient's labs and EKG.  EKG shows a normal sinus rhythm without acute findings.  COVID flu and RSV are all negative, troponin is normal.  11:59 AM I have independently visualized and interpreted pt's images today.  Chest x-ray with bilateral pleural  effusions but also findings concerning for pulmonary edema.  Radiology reports mild bilateral interstitial opacities and bibasilar patchy and linear opacities which could be edema or atypical infection.  Suspect more fluid than infection but patient has had some productive cough.  Will start with IV Lasix and admit for further care due to his failure at home even with increased diuretics.  Discussed this with the patient and his family member and they are comfortable with this plan.  Consulted hospitalist for admission. CRITICAL CARE Performed by: Ovid Witman Total critical care time: 30 minutes Critical care time was exclusive of separately billable procedures and treating other patients. Critical care was necessary to treat or prevent imminent or life-threatening deterioration. Critical care was time spent personally by me on the following activities: development of treatment plan with patient and/or surrogate as well as nursing, discussions with consultants, evaluation of patient's response to treatment, examination of patient, obtaining history from patient or surrogate, ordering and performing treatments and interventions, ordering and review of laboratory studies, ordering and review of radiographic studies, pulse oximetry and re-evaluation of patient's condition.            Final Clinical Impression(s) / ED Diagnoses Final diagnoses:  SOB (shortness of breath)  Acute congestive heart failure, unspecified heart failure type [facility])    Rx / DC Orders ED Discharge Orders     None         Gwyneth Sprout, MD 07/23/23 1159

## 2023-07-23 NOTE — ED Triage Notes (Signed)
From [facility] for treatment, [facility] center report states fluid overload, exertional SOB and Hypoxia, uses 4ltrs Nasal Cannula 24-7 currently. Seen Monday to symptom management clinic and was given Lasix. Took 40mg  Lasix this morning.

## 2023-07-24 DIAGNOSIS — J9601 Acute respiratory failure with hypoxia: Secondary | ICD-10-CM | POA: Diagnosis not present

## 2023-07-24 LAB — COMPREHENSIVE METABOLIC PANEL
ALT: 15 U/L (ref 0–44)
AST: 16 U/L (ref 15–41)
Albumin: 2.8 g/dL — ABNORMAL LOW (ref 3.5–5.0)
Alkaline Phosphatase: 126 U/L (ref 38–126)
Anion gap: 11 (ref 5–15)
BUN: 31 mg/dL — ABNORMAL HIGH (ref 8–23)
CO2: 25 mmol/L (ref 22–32)
Calcium: 9.5 mg/dL (ref 8.9–10.3)
Chloride: 104 mmol/L (ref 98–111)
Creatinine, Ser: 0.82 mg/dL (ref 0.61–1.24)
GFR, Estimated: >60 mL/min (ref >60)
Glucose, Bld: 143 mg/dL — ABNORMAL HIGH (ref 70–99)
Potassium: 4.3 mmol/L (ref 3.5–5.1)
Sodium: 140 mmol/L (ref 135–145)
Total Bilirubin: 0.2 mg/dL (ref 0.0–1.2)
Total Protein: 7.8 g/dL (ref 6.5–8.1)

## 2023-07-24 LAB — CBC
HCT: 27.4 % — ABNORMAL LOW (ref 39.0–52.0)
Hemoglobin: 8.3 g/dL — ABNORMAL LOW (ref 13.0–17.0)
MCH: 27.5 pg (ref 26.0–34.0)
MCHC: 30.3 g/dL (ref 30.0–36.0)
MCV: 90.7 fL (ref 80.0–100.0)
Platelets: 391 10*3/uL (ref 150–400)
RBC: 3.02 MIL/uL — ABNORMAL LOW (ref 4.22–5.81)
RDW: 17.4 % — ABNORMAL HIGH (ref 11.5–15.5)
WBC: 6.7 10*3/uL (ref 4.0–10.5)
nRBC: 0 % (ref 0.0–0.2)

## 2023-07-24 MED ORDER — ENSURE MAX PROTEIN PO LIQD
11.0000 [oz_av] | Freq: Every day | ORAL | Status: DC
  Administered 2023-07-24 – 2023-07-27 (×4): 11 [oz_av] via ORAL
  Filled 2023-07-24 (×4): qty 330

## 2023-07-24 MED ORDER — IPRATROPIUM-ALBUTEROL 0.5-2.5 (3) MG/3ML IN SOLN
3.0000 mL | Freq: Two times a day (BID) | RESPIRATORY_TRACT | Status: DC
  Administered 2023-07-24 – 2023-07-26 (×4): 3 mL via RESPIRATORY_TRACT
  Filled 2023-07-24 (×6): qty 3

## 2023-07-24 MED ORDER — FUROSEMIDE 40 MG PO TABS
40.0000 mg | ORAL_TABLET | Freq: Every day | ORAL | Status: DC
  Administered 2023-07-25 – 2023-07-27 (×3): 40 mg via ORAL
  Filled 2023-07-24 (×3): qty 1

## 2023-07-24 MED ORDER — IPRATROPIUM-ALBUTEROL 0.5-2.5 (3) MG/3ML IN SOLN
3.0000 mL | RESPIRATORY_TRACT | Status: DC | PRN
  Administered 2023-07-24: 3 mL via RESPIRATORY_TRACT

## 2023-07-24 MED ORDER — BUDESONIDE 0.25 MG/2ML IN SUSP
0.2500 mg | Freq: Two times a day (BID) | RESPIRATORY_TRACT | Status: DC
  Administered 2023-07-24 – 2023-07-26 (×5): 0.25 mg via RESPIRATORY_TRACT
  Filled 2023-07-24 (×7): qty 2

## 2023-07-24 MED ORDER — CYCLOBENZAPRINE HCL 10 MG PO TABS: 10.0000 mg | ORAL_TABLET | Freq: Every day | ORAL | Status: DC | PRN

## 2023-07-24 MED ORDER — FUROSEMIDE 40 MG PO TABS: 40.0000 mg | ORAL_TABLET | Freq: Two times a day (BID) | ORAL | Status: DC

## 2023-07-24 MED ORDER — PREDNISONE 20 MG PO TABS
40.0000 mg | ORAL_TABLET | Freq: Every day | ORAL | Status: DC
  Administered 2023-07-24 – 2023-07-26 (×3): 40 mg via ORAL
  Filled 2023-07-24 (×2): qty 2
  Filled 2023-07-24: qty 4
  Filled 2023-07-24: qty 2

## 2023-07-24 MED ORDER — FUROSEMIDE 20 MG PO TABS
20.0000 mg | ORAL_TABLET | Freq: Every day | ORAL | Status: DC
  Administered 2023-07-24 – 2023-07-26 (×3): 20 mg via ORAL
  Filled 2023-07-24 (×3): qty 1

## 2023-07-24 NOTE — Hospital Course (Addendum)
 78 year old male with multiple myeloma 127 and chronic anemia, history of CAD, GERD, hypertriglyceridemia impaired fasting glucose, neuropathy nontoxic nodular goiter RLS obesity sent from cancer center to ED due to hypoxia and dyspnea and concern for fluid overload.  Patient having progressively worsening dyspnea 2 to 3 weeks also with wheezing productive cough mild pleuritic chest pain fatigue lower leg edema and orthopnea, has been sleeping on the recliner.  Recently his  furosemide dose was increased and placed on Aldactone without improvement. In the ED vitals stable afebrile SpO2 90% on [place] at 4 L.  Labs showed fairly stable CMP, CBC with chronic anemia 8.6 g RSV COVID influenza negative, low albumin at 3.2. CXR>>Mild bilateral interstitial opacities and bibasilar patchy and linear opacities, which may represent pulmonary edema or atypical infection.  Small pleural effusion and cardiomegaly. Patient was given iv Lasix 60 mg x 1, Solu-Medrol 40 x 1 and admission requested for further management Patient clinically improved, managed steroidx few doses PO along with home diuretic regimen. PT OT was consulted, overall he is ambulatory, off oxygen.  He had episode of chest pain mostly pleuritic but it is on bilateral lateral chest walls-tender to palpation, troponin x 2 negative EKG normal sinus rhythm no ST or T wave changes no indication of ACS.D-dimer elevated> CTA showed progression of myeloma and new/old fractures as below likely cause of patient's presentation including dyspnea on exertion chest pain shortness of breath: CTA>> Exam is limited by excessive motion artifact which diminishes exam detail beyond the level of the lobar pulmonary arteries. Within these limitations, no central obstructing pulmonary embolus identified.  Extensive lucent bone lesions identified throughout the bony thorax compatible with history of multiple myeloma. Compared with the previous exam there has been marked progression  of disease with increase multiplicity and size of previously noted lucent bone lesions. Numerous bilateral rib fractures of varying ages, some of which are likely pathologic in nature given the extent multiple myeloma. Compared with the previous exam some of these fractures are new none. Seen by oncology team and myself W/ oncology team in the room with patient, his wife and granddaughter on the phone> WAS informed of finding on the CT scan the extensive lucent bone lesions multiple fractures 1 unit PRBC ordered by oncology team and monitor overnight.  Zometa was ordered by Dr Myna Hidalgo but patient and patient's grandson refused and they want to discuss with Dr Bertis Ruddy in 1-2 day about that. His leg edema is improved no more chest pains or dyspnea, he is doing well on room air. He is requesting for discharge this morning.

## 2023-07-24 NOTE — Progress Notes (Signed)
   07/24/23 0933  TOC Brief Assessment  Insurance and Status Reviewed  Patient has primary care physician Yes  Home environment has been reviewed home w/ spouse  Prior level of function: independent  Prior/Current Home Services No current home services  Social Drivers of Health Review SDOH reviewed no interventions necessary  Readmission risk has been reviewed Yes  Transition of care needs no transition of care needs at this time   Utilities resources added to AVS

## 2023-07-24 NOTE — Progress Notes (Signed)
 Mobility Specialist - Progress Note  (RA) Pre-mobility: 71 bpm HR, 95% SpO2 During mobility: 95 bpm HR, 91% SpO2 Post-mobility: 83 bpm HR, 92% SPO2   07/24/23 1210  Mobility  Activity Ambulated with assistance in hallway  Level of Assistance Standby assist, set-up cues, supervision of patient - no hands on  Assistive Device Four wheel walker  Distance Ambulated (ft) 200 ft  Range of Motion/Exercises Active  Activity Response Tolerated well  Mobility Referral Yes  Mobility visit 1 Mobility  Mobility Specialist Start Time (ACUTE ONLY) 1155  Mobility Specialist Stop Time (ACUTE ONLY) 1206  Mobility Specialist Time Calculation (min) (ACUTE ONLY) 11 min   Pt was found on recliner chair and agreeable to ambulate. No complaints with session. At EOS returned to recliner chair with all needs met. Family in room.  Billey Chang Mobility Specialist

## 2023-07-24 NOTE — Care Management Obs Status (Signed)
 MEDICARE OBSERVATION STATUS NOTIFICATION   Patient Details  Name: TRACKER MANCE MRN: [id number] Date of Birth: 12-26-1945   Medicare Observation Status Notification Given:  Yes    Diona Browner, LCSW 07/24/2023, 3:36 PM

## 2023-07-24 NOTE — Progress Notes (Signed)
 PROGRESS NOTE Daniel Whitaker  UEA:[id number] DOB: [birth year] DOA: 07/23/2023 PCP: [name], MD  Brief Narrative/Hospital Course: 78 year old male with multiple myeloma 127 and chronic anemia, history of CAD, GERD, hypertriglyceridemia impaired fasting glucose, neuropathy nontoxic nodular goiter RLS obesity sent from cancer center to ED due to hypoxia and dyspnea and concern for fluid overload.  Patient having progressively worsening dyspnea 2 to 3 weeks also with wheezing productive cough mild pleuritic chest pain fatigue lower leg edema and orthopnea, has been sleeping on the recliner.  Recently his  furosemide dose was increased and placed on Aldactone without improvement. In the ED vitals stable afebrile SpO2 90% on [place] at 4 L.  Labs showed fairly stable CMP, CBC with chronic anemia 8.6 g RSV COVID influenza negative, low albumin at 3.2. CXR>>Mild bilateral interstitial opacities and bibasilar patchy and linear opacities, which may represent pulmonary edema or atypical infection.  Small pleural effusion and cardiomegaly.  Patient was given iv Lasix 60 mg x 1, Solu-Medrol 40 x 1 and admission requested for further management       Subjective: Seen and examined Overnight afebrile On 3 liter [place] - bp Labs STABLE W/ hemoglobin 8.3 mg stable CMP with low albumin 2.8  He is resting comfortably on the bedside chair  Assessment and Plan: Principal Problem:   Acute respiratory failure with hypoxia (HCC) Active Problems:   Multiple myeloma without remission (HCC)   Class 1 obesity due to excess calories in adult   CAD (coronary artery disease)   Hyperlipidemia   Essential hypertension   Fluid overload   Pressure injury of skin   URI (upper respiratory infection)   Reactive airway disease with wheezing   Glaucoma   Acute hypoxic respiratory failure- at home on PRN/nightly [place] Upper respiratory tract infection Reactive airway disease Concern for fluid overload: Patient having  progressive symptoms 2 to 3 weeks wheezing cough orthopnea leg edema no. BNP 116 but was high 160 -170 last 5days-3wk, imaging finding concerning for small pleural effusion possibly  pulmonary edema or atypical infection.  Received Solu-Medrol in the ED, continue prednisone as ordered, bronchodilators add Pulmicort , patient echo 2/18 with normal EF no valvular abnormalities-he does have leg edema likely deconditioning and dependent edema and also could be myeloma related.  On Lasix and Aldactone at home we will resume Lasix Aldactone.    Multiple myeloma under remission: Followed by Dr. Bertis Ruddy, on dexamethasone once a week only due to fluid retention, on acyclovir for antimicrobial prophylaxis, aspirin for DVT prophylaxis and lenalidomide on hold this week due to persistent anemia, Zometa on hold until dental clearance.  He will follow-up with oncology  Anemia of chronic disease due to renal failure and chemotherapy: monitor hemoglobin, followed by hematology.  Lenalidomide on hold  HTN CAD HLD: No complaint of chest pain.  Troponin negative in ED.Continue his Coreg, he is intolerant to statin.  Continue diuretics as above.  Glaucoma: Continue eyedrops.  Pressure injury:  Continue wound care  Pressure injury stage II POA's dressing change as below Pressure Injury 07/23/23 Buttocks Medial Stage 2 -  Partial thickness loss of dermis presenting as a shallow open injury with a red, pink wound bed without slough. (Active)  07/23/23 1512  Location: Buttocks  Location Orientation: Medial  Staging: Stage 2 -  Partial thickness loss of dermis presenting as a shallow open injury with a red, pink wound bed without slough.  Wound Description (Comments):   Present on Admission: Yes  Dressing Type Foam -  Lift dressing to assess site every shift 07/23/23 2001    DVT prophylaxis: SCDs Start: 07/23/23 1446 Code Status:   Code Status: Full Code Family Communication: plan of care discussed with  patient at bedside. Patient status is: Remains hospitalized because of severity of illness Level of care: Progressive   Dispo: The patient is from: Home            Anticipated disposition: TBD Objective: Vitals last 24 hrs: Vitals:   07/24/23 0034 07/24/23 0423 07/24/23 0859 07/24/23 0923  BP:  (!) 127/56 133/65   Pulse:  77 74 68  Resp:  16  18  Temp:  (!) 97.5 F (36.4 C)    TempSrc:  Oral    SpO2: 95% 95%  97%   Weight change:   Physical Examination: General exam: alert awake,at baseline, older than stated age HEENT:Oral mucosa moist, Ear/Nose WNL grossly Respiratory system: Bilaterally clear BS,no use of accessory muscle Cardiovascular system: S1 & S2 +, No JVD. Gastrointestinal system: Abdomen soft,NT,ND, BS+ Nervous System: Alert, awake, moving all extremities,and following commands. Extremities: LE edema neg,distal peripheral pulses palpable and warm.  Skin: No rashes,no icterus. MSK: Normal muscle bulk,tone, power   Medications reviewed:  Scheduled Meds:  acyclovir  400 mg Oral Daily   aspirin EC  325 mg Oral Daily   brimonidine  1 drop Left Eye BID   budesonide (PULMICORT) nebulizer solution  0.25 mg Nebulization BID   carvedilol  3.125 mg Oral BID WC   docusate sodium  100 mg Oral BID   dorzolamide-timolol  1 drop Both Eyes BID   ipratropium-albuterol  3 mL Nebulization TID   latanoprost  1 drop Both Eyes QHS   pantoprazole  40 mg Oral QAC breakfast   potassium chloride SA  20 mEq Oral Daily   predniSONE  40 mg Oral Q breakfast   pregabalin  100 mg Oral BID   senna-docusate  1 tablet Oral BID   spironolactone  25 mg Oral Daily   Continuous Infusions: Diet Order             Diet Heart Room service appropriate? Yes; Fluid consistency: Thin; Fluid restriction: 1200 mL Fluid  Diet effective now                  Intake/Output Summary (Last 24 hours) at 07/24/2023 1118 Last data filed at 07/24/2023 1058 Gross per 24 hour  Intake 180 ml  Output 1215  ml  Net -1035 ml   Net IO Since Admission: -1,035 mL [07/24/23 1118]  Wt Readings from Last 3 Encounters:  07/23/23 104.2 kg  07/19/23 105.2 kg  07/16/23 103.2 kg     Unresulted Labs (From admission, onward)    None     Data Reviewed: I have personally reviewed following labs and imaging studies CBC: Recent Labs  Lab 07/19/23 1226 07/23/23 0820 07/24/23 0427  WBC 8.0 7.2 6.7  NEUTROABS 6.2 4.2  --   HGB 8.5* 8.6* 8.3*  HCT 26.9* 28.2* 27.4*  MCV 86.2 89.5 90.7  PLT 377 391 391   Basic Metabolic Panel:  Recent Labs  Lab 07/19/23 1226 07/23/23 0820 07/23/23 1531 07/24/23 0427  NA 138 138  --  140  K 3.9 4.3  --  4.3  CL 104 104  --  104  CO2 26 29  --  25  GLUCOSE 105* 94  --  143*  BUN 23 24*  --  31*  CREATININE 0.78 0.84  --  0.82  CALCIUM 9.2 9.5  --  9.5  MG  --   --  2.0  --   PHOS  --   --  3.1  --    Recent Labs  Lab 07/19/23 1226 07/23/23 0820 07/24/23 0427  AST 13* 12* 16  ALT 15 10 15   ALKPHOS 157* 147* 126  BILITOT 0.3 0.2 0.2  PROT 7.3 7.4 7.8  ALBUMIN 3.2* 3.2* 2.8*  Sepsis Labs: No results for input(s): "PROCALCITON", "LATICACIDVEN" in the last 168 hours. Recent Results (from the past 240 hours)  Resp panel by RT-PCR (RSV, Flu A&B, Covid) Anterior Nasal Swab     Status: None   Collection Time: 07/23/23 10:16 AM   Specimen: Anterior Nasal Swab  Result Value Ref Range Status   SARS Coronavirus 2 by RT PCR NEGATIVE NEGATIVE Final    Comment: (NOTE) SARS-CoV-2 target nucleic acids are NOT DETECTED.  The SARS-CoV-2 RNA is generally detectable in upper respiratory specimens during the acute phase of infection. The lowest concentration of SARS-CoV-2 viral copies this assay can detect is 138 copies/mL. A negative result does not preclude SARS-Cov-2 infection and should not be used as the sole basis for treatment or other patient management decisions. A negative result may occur with  improper specimen collection/handling, submission of  specimen other than nasopharyngeal swab, presence of viral mutation(s) within the areas targeted by this assay, and inadequate number of viral copies(<138 copies/mL). A negative result must be combined with clinical observations, patient history, and epidemiological information. The expected result is Negative.  Fact Sheet for Patients:  BloggerCourse.com  Fact Sheet for Healthcare Providers:  SeriousBroker.it  This test is no t yet approved or cleared by the [place] FDA and  has been authorized for detection and/or diagnosis of SARS-CoV-2 by FDA under an Emergency Use Authorization (EUA). This EUA will remain  in effect (meaning this test can be used) for the duration of the COVID-19 declaration under Section 564(b)(1) of the Act, 21 U.S.C.section 360bbb-3(b)(1), unless the authorization is terminated  or revoked sooner.       Influenza A by PCR NEGATIVE NEGATIVE Final   Influenza B by PCR NEGATIVE NEGATIVE Final    Comment: (NOTE) The Xpert Xpress SARS-CoV-2/FLU/RSV plus assay is intended as an aid in the diagnosis of influenza from Nasopharyngeal swab specimens and should not be used as a sole basis for treatment. Nasal washings and aspirates are unacceptable for Xpert Xpress SARS-CoV-2/FLU/RSV testing.  Fact Sheet for Patients: BloggerCourse.com  Fact Sheet for Healthcare Providers: SeriousBroker.it  This test is not yet approved or cleared by the [place] FDA and has been authorized for detection and/or diagnosis of SARS-CoV-2 by FDA under an Emergency Use Authorization (EUA). This EUA will remain in effect (meaning this test can be used) for the duration of the COVID-19 declaration under Section 564(b)(1) of the Act, 21 U.S.C. section 360bbb-3(b)(1), unless the authorization is terminated or revoked.     Resp Syncytial Virus by PCR NEGATIVE NEGATIVE Final     Comment: (NOTE) Fact Sheet for Patients: BloggerCourse.com  Fact Sheet for Healthcare Providers: SeriousBroker.it  This test is not yet approved or cleared by the [place] FDA and has been authorized for detection and/or diagnosis of SARS-CoV-2 by FDA under an Emergency Use Authorization (EUA). This EUA will remain in effect (meaning this test can be used) for the duration of the COVID-19 declaration under Section 564(b)(1) of the Act, 21 U.S.C. section 360bbb-3(b)(1), unless the authorization is terminated or revoked.  Performed at [facility], [address] [place], [place] [address]     Antimicrobials/Microbiology: Anti-infectives (From admission, onward)    Start     Dose/Rate Route Frequency Ordered Stop   07/24/23 1000  acyclovir (ZOVIRAX) tablet 400 mg        400 mg Oral Daily 07/23/23 1640           Component Value Date/Time   SDES  05/03/2023 2841    BLOOD RIGHT ANTECUBITAL Performed at [facility], [address], [place], [place] [address]    [facility]  05/03/2023 1027    BOTTLES DRAWN AEROBIC AND ANAEROBIC Blood Culture adequate volume Performed at [facility], [address], [place], [place] [address]    CULT  05/03/2023 0137    NO GROWTH 5 DAYS Performed at [facility], [address], [place], [place] [address]    REPTSTATUS 05/08/2023 FINAL 05/03/2023 7425     Radiology Studies: [facility] Chest Port 1 View Result Date: 07/23/2023 CLINICAL DATA:  Shortness of breath and hypoxia EXAM: PORTABLE CHEST 1 VIEW COMPARISON:  Chest radiograph dated 07/19/2023 FINDINGS: Normal lung volumes. Mild bilateral interstitial opacities and bibasilar patchy and linear opacities. Blunting of the bilateral costophrenic angles. No pneumothorax. Similar enlarged cardiomediastinal silhouette. Old bilateral rib fractures. IMPRESSION: 1. Mild bilateral  interstitial opacities and bibasilar patchy and linear opacities, which may represent pulmonary edema or atypical infection. 2. Blunting of the bilateral costophrenic angles, which may represent small pleural effusions. 3. Similar cardiomegaly. Electronically Signed   By: Agustin Cree M.D.   On: 07/23/2023 10:45     LOS: 0 days   Total time spent in review of labs and imaging, patient evaluation, formulation of plan, documentation and communication with family: 35 minutes  Lanae Boast, MD  [facility]  07/24/2023, 11:18 AM

## 2023-07-25 ENCOUNTER — Observation Stay (HOSPITAL_COMMUNITY): Payer: Medicare Other

## 2023-07-25 DIAGNOSIS — J069 Acute upper respiratory infection, unspecified: Secondary | ICD-10-CM | POA: Diagnosis not present

## 2023-07-25 DIAGNOSIS — N19 Unspecified kidney failure: Secondary | ICD-10-CM | POA: Diagnosis not present

## 2023-07-25 DIAGNOSIS — E785 Hyperlipidemia, unspecified: Secondary | ICD-10-CM | POA: Diagnosis not present

## 2023-07-25 DIAGNOSIS — Z1152 Encounter for screening for COVID-19: Secondary | ICD-10-CM | POA: Diagnosis not present

## 2023-07-25 DIAGNOSIS — Z955 Presence of coronary angioplasty implant and graft: Secondary | ICD-10-CM | POA: Diagnosis not present

## 2023-07-25 DIAGNOSIS — J9601 Acute respiratory failure with hypoxia: Secondary | ICD-10-CM | POA: Diagnosis not present

## 2023-07-25 DIAGNOSIS — Z7982 Long term (current) use of aspirin: Secondary | ICD-10-CM | POA: Diagnosis not present

## 2023-07-25 DIAGNOSIS — E876 Hypokalemia: Secondary | ICD-10-CM | POA: Diagnosis not present

## 2023-07-25 DIAGNOSIS — R059 Cough, unspecified: Secondary | ICD-10-CM | POA: Diagnosis not present

## 2023-07-25 DIAGNOSIS — H409 Unspecified glaucoma: Secondary | ICD-10-CM | POA: Diagnosis not present

## 2023-07-25 DIAGNOSIS — Z79899 Other long term (current) drug therapy: Secondary | ICD-10-CM | POA: Diagnosis not present

## 2023-07-25 DIAGNOSIS — R0602 Shortness of breath: Secondary | ICD-10-CM | POA: Diagnosis not present

## 2023-07-25 DIAGNOSIS — E8770 Fluid overload, unspecified: Secondary | ICD-10-CM | POA: Diagnosis not present

## 2023-07-25 DIAGNOSIS — R918 Other nonspecific abnormal finding of lung field: Secondary | ICD-10-CM | POA: Diagnosis not present

## 2023-07-25 DIAGNOSIS — D638 Anemia in other chronic diseases classified elsewhere: Secondary | ICD-10-CM | POA: Diagnosis not present

## 2023-07-25 DIAGNOSIS — R079 Chest pain, unspecified: Secondary | ICD-10-CM | POA: Diagnosis not present

## 2023-07-25 DIAGNOSIS — L89302 Pressure ulcer of unspecified buttock, stage 2: Secondary | ICD-10-CM | POA: Diagnosis not present

## 2023-07-25 DIAGNOSIS — C9000 Multiple myeloma not having achieved remission: Secondary | ICD-10-CM | POA: Diagnosis not present

## 2023-07-25 DIAGNOSIS — Z87891 Personal history of nicotine dependence: Secondary | ICD-10-CM | POA: Diagnosis not present

## 2023-07-25 DIAGNOSIS — J45909 Unspecified asthma, uncomplicated: Secondary | ICD-10-CM | POA: Diagnosis not present

## 2023-07-25 DIAGNOSIS — I2510 Atherosclerotic heart disease of native coronary artery without angina pectoris: Secondary | ICD-10-CM | POA: Diagnosis not present

## 2023-07-25 LAB — COMPREHENSIVE METABOLIC PANEL
ALT: 19 U/L (ref 0–44)
AST: 20 U/L (ref 15–41)
Albumin: 2.8 g/dL — ABNORMAL LOW (ref 3.5–5.0)
Alkaline Phosphatase: 126 U/L (ref 38–126)
Anion gap: 7 (ref 5–15)
BUN: 31 mg/dL — ABNORMAL HIGH (ref 8–23)
CO2: 28 mmol/L (ref 22–32)
Calcium: 9.5 mg/dL (ref 8.9–10.3)
Chloride: 107 mmol/L (ref 98–111)
Creatinine, Ser: 0.92 mg/dL (ref 0.61–1.24)
GFR, Estimated: >60 mL/min (ref >60)
Glucose, Bld: 116 mg/dL — ABNORMAL HIGH (ref 70–99)
Potassium: 4.1 mmol/L (ref 3.5–5.1)
Sodium: 142 mmol/L (ref 135–145)
Total Bilirubin: 0.4 mg/dL (ref 0.0–1.2)
Total Protein: 7.8 g/dL (ref 6.5–8.1)

## 2023-07-25 LAB — BRAIN NATRIURETIC PEPTIDE: B Natriuretic Peptide: 210.9 pg/mL — ABNORMAL HIGH (ref 0.0–100.0)

## 2023-07-25 LAB — CBC
HCT: 28.5 % — ABNORMAL LOW (ref 39.0–52.0)
Hemoglobin: 8.4 g/dL — ABNORMAL LOW (ref 13.0–17.0)
MCH: 27.4 pg (ref 26.0–34.0)
MCHC: 29.5 g/dL — ABNORMAL LOW (ref 30.0–36.0)
MCV: 92.8 fL (ref 80.0–100.0)
Platelets: 368 10*3/uL (ref 150–400)
RBC: 3.07 MIL/uL — ABNORMAL LOW (ref 4.22–5.81)
RDW: 17.8 % — ABNORMAL HIGH (ref 11.5–15.5)
WBC: 9.4 10*3/uL (ref 4.0–10.5)
nRBC: 0 % (ref 0.0–0.2)

## 2023-07-25 LAB — TROPONIN I (HIGH SENSITIVITY)
Troponin I (High Sensitivity): 5 ng/L (ref <18)
Troponin I (High Sensitivity): 5 ng/L (ref <18)

## 2023-07-25 LAB — D-DIMER, QUANTITATIVE: D-Dimer, Quant: 2.56 ug{FEU}/mL — ABNORMAL HIGH (ref 0.00–0.50)

## 2023-07-25 MED ORDER — KETOROLAC TROMETHAMINE 30 MG/ML IJ SOLN
30.0000 mg | Freq: Once | INTRAMUSCULAR | Status: AC
  Administered 2023-07-25: 30 mg via INTRAVENOUS
  Filled 2023-07-25: qty 1

## 2023-07-25 MED ORDER — LORAZEPAM 0.5 MG PO TABS
0.5000 mg | ORAL_TABLET | Freq: Once | ORAL | Status: AC | PRN
  Administered 2023-07-26: 0.5 mg via ORAL
  Filled 2023-07-25: qty 1

## 2023-07-25 MED ORDER — KETOROLAC TROMETHAMINE 15 MG/ML IJ SOLN
15.0000 mg | Freq: Four times a day (QID) | INTRAMUSCULAR | Status: DC | PRN
  Administered 2023-07-25 – 2023-07-26 (×2): 15 mg via INTRAVENOUS
  Filled 2023-07-25 (×2): qty 1

## 2023-07-25 NOTE — Progress Notes (Signed)
 PT Cancellation Note  Patient Details Name: Daniel Whitaker MRN: [id number] DOB: [birth year]   Cancelled Treatment:    Reason Eval/Treat Not Completed:  Order received. Chart reviewed. CT angio ordered today by MD. Will hold PT for now. Will check back another day.    Faye Ramsay, PT Acute Rehabilitation  Office: [phone]

## 2023-07-25 NOTE — Progress Notes (Signed)
 PROGRESS NOTE PILOT PRINDLE  WUJ:[id number] DOB: [birth year] DOA: 07/23/2023 PCP: [name], MD  Brief Narrative/Hospital Course: 78 year old male with multiple myeloma 127 and chronic anemia, history of CAD, GERD, hypertriglyceridemia impaired fasting glucose, neuropathy nontoxic nodular goiter RLS obesity sent from cancer center to ED due to hypoxia and dyspnea and concern for fluid overload.  Patient having progressively worsening dyspnea 2 to 3 weeks also with wheezing productive cough mild pleuritic chest pain fatigue lower leg edema and orthopnea, has been sleeping on the recliner.  Recently his  furosemide dose was increased and placed on Aldactone without improvement. In the ED vitals stable afebrile SpO2 90% on [place] at 4 L.  Labs showed fairly stable CMP, CBC with chronic anemia 8.6 g RSV COVID influenza negative, low albumin at 3.2. CXR>>Mild bilateral interstitial opacities and bibasilar patchy and linear opacities, which may represent pulmonary edema or atypical infection.  Small pleural effusion and cardiomegaly.  Patient was given iv Lasix 60 mg x 1, Solu-Medrol 40 x 1 and admission requested for further management Patient clinically improved, managed with a steroid along with home diuretic regimen.  PT OT was consulted, overall he is ambulatory, at this time he is stable for discharge home.       Subjective: Patient seen and examined C/o chest pain in bilateral lower chest laterally worse with deep breath and tender on palpation Has been having similar pains with associated shortness of breath for some time and was seen at oncology office and sent to the ED for same-the area is tender to palpation On RA now, Grandson on the phone, family  members at the bedside extensively discussed  Assessment and Plan: Principal Problem:   Acute respiratory failure with hypoxia (HCC) Active Problems:   Multiple myeloma without remission (HCC)   Class 1 obesity due to excess calories in  adult   CAD (coronary artery disease)   Hyperlipidemia   Essential hypertension   Fluid overload   Pressure injury of skin   URI (upper respiratory infection)   Reactive airway disease with wheezing   Glaucoma    Acute hypoxic respiratory failure- at home on PRN/nightly [place] /Reactive airway disease Concern for fluid overload: Patient having progressive symptoms 2 to 3 weeks wheezing cough orthopnea leg edema no. BNP 116 but was high 160 -170 last 5days-3wk, imaging finding concerning for small pleural effusion possibly  pulmonary edema or atypical infection.  Received Solu-Medrol in the ED, and given prednisone  bronchodilators Pulmicort , patient echo 07/20/23- with some limitation- but with normal EF no valvular abnormalities. ? Edema/ mild pleural effusions could be myeloma /hypoalbuminemia related. He will cont on home lasix and Aldactone, steroid taper . This morning having worsening chest pain-see below  Bilateral lateral chest wall pain X-ray with small bibasilar atelectasis or infiltrate with probable small bilateral pleural effusion. Pain Worse with deep breath obtained troponin and D-dimer this morning D-dimer elevated, will obtain CT angio chest.  Tender to palpation question related to myeloma affecting the bone -needs PET scan due to fifth and sixth ribs involvement, does have multiple lesions on the bone.  Briefly discussed with on-call hematology, in agreement with CT angio chest given high risk for PE with myeloma and treatment.  Added Toradol x 1, continue pain management   Multiple myeloma under remission: Followed by Dr. Bertis Ruddy, on dexamethasone once a week only due to fluid retention, on acyclovir for antimicrobial prophylaxis, aspirin for DVT prophylaxis and lenalidomide on hold this week due to persistent anemia,  Zometa on hold until dental clearance.  He will follow-up with oncology   Anemia of chronic disease due to renal failure and chemotherapy: Hb stable f/b  hematology.  Lenalidomide on hold Recent Labs  Lab 07/19/23 1226 07/23/23 0820 07/24/23 0427 07/25/23 1133  HGB 8.5* 8.6* 8.3* 8.4*  HCT 26.9* 28.2* 27.4* 28.5*     HTN CAD hx of stent 09/2021 HLD: Troponin negative in ED anf again today. Continue his Coreg, he is intolerant to statin.  Continue diuretics as above. His last cath as below CARDIAC CATH 09/2021 CATH>Severe single-vessel disease with 85% proximal LAD ->  Successful DES PCI using Synergy DES 2.5 mm x 12 mm postdilated/deployed to 2.8 mm.   TIMI-3 flow pre and post.  Reduced to 0%. Otherwise angiographically normal coronary arteries. Normal LVEF with normal EDP   Glaucoma: Continue eyedrops.   Pressure injury:  Continue wound care as below Pressure injury stage II POA's dressing change as below Pressure Injury 07/23/23 Buttocks Medial Stage 2 -  Partial thickness loss of dermis presenting as a shallow open injury with a red, pink wound bed without slough. (Active)  07/23/23 1512  Location: Buttocks  Location Orientation: Medial  Staging: Stage 2 -  Partial thickness loss of dermis presenting as a shallow open injury with a red, pink wound bed without slough.  Wound Description (Comments):   Present on Admission: Yes  Dressing Type Foam - Lift dressing to assess site every shift 07/23/23 2001    DVT prophylaxis: SCDs Start: 07/23/23 1446 Code Status:   Code Status: Full Code Family Communication: plan of care discussed with patient at bedside. Patient status is: Remains hospitalized because of severity of illness Level of care: Progressive   Dispo: The patient is from: Home            Anticipated disposition: TBD Objective: Vitals last 24 hrs: Vitals:   07/24/23 1524 07/24/23 2028 07/25/23 0448 07/25/23 0833  BP:  (!) 125/57 134/68 (!) 142/74  Pulse:  78 71 65  Resp:  20 20 16   Temp:  98.5 F (36.9 C) 98.2 F (36.8 C) (!) 97.5 F (36.4 C)  TempSrc:  Oral Oral Axillary  SpO2: 93% 94% 98% 98%    Weight change:   Physical Examination: General exam: alert awake, appears uncomfortable due to chest wall.   HEENT:Oral mucosa moist, Ear/Nose WNL grossly Respiratory system: Bilaterally clear BS,no use of accessory muscle Cardiovascular system: S1 & S2 +, No JVD. Gastrointestinal system: Abdomen soft,NT,ND, BS+ Nervous System: Alert, awake, moving all extremities,and following commands. Extremities: LE edema neg,distal peripheral pulses palpable and warm.  Skin: No rashes,no icterus. MSK: Normal muscle bulk,tone, power   Medications reviewed:  Scheduled Meds:  acyclovir  400 mg Oral Daily   aspirin EC  325 mg Oral Daily   brimonidine  1 drop Left Eye BID   budesonide (PULMICORT) nebulizer solution  0.25 mg Nebulization BID   carvedilol  3.125 mg Oral BID WC   docusate sodium  100 mg Oral BID   dorzolamide-timolol  1 drop Both Eyes BID   furosemide  20 mg Oral q1600   furosemide  40 mg Oral Daily   ipratropium-albuterol  3 mL Nebulization BID   ketorolac  30 mg Intravenous Once   latanoprost  1 drop Both Eyes QHS   pantoprazole  40 mg Oral QAC breakfast   potassium chloride SA  20 mEq Oral Daily   predniSONE  40 mg Oral Q breakfast   pregabalin  100 mg Oral BID   Ensure Max Protein  11 oz Oral Daily   senna-docusate  1 tablet Oral BID   spironolactone  25 mg Oral Daily   Continuous Infusions: Diet Order             Diet Heart Room service appropriate? Yes; Fluid consistency: Thin; Fluid restriction: 1200 mL Fluid  Diet effective now                  Intake/Output Summary (Last 24 hours) at 07/25/2023 0950 Last data filed at 07/25/2023 0925 Gross per 24 hour  Intake 540 ml  Output 2015 ml  Net -1475 ml   Net IO Since Admission: -1,800 mL [07/25/23 0950]  Wt Readings from Last 3 Encounters:  07/23/23 104.2 kg  07/19/23 105.2 kg  07/16/23 103.2 kg     Unresulted Labs (From admission, onward)     Start     Ordered   07/25/23 0941  Comprehensive metabolic  panel  Add-on,   AD       Question:  Specimen collection method  Answer:  Lab=Lab collect   07/25/23 0941   07/25/23 0940  CBC  Add-on,   AD       Question:  Specimen collection method  Answer:  Lab=Lab collect   07/25/23 0941   07/25/23 0911  D-dimer, quantitative  ONCE - STAT,   STAT        07/25/23 0911          Data Reviewed: I have personally reviewed following labs and imaging studies CBC: Recent Labs  Lab 07/19/23 1226 07/23/23 0820 07/24/23 0427  WBC 8.0 7.2 6.7  NEUTROABS 6.2 4.2  --   HGB 8.5* 8.6* 8.3*  HCT 26.9* 28.2* 27.4*  MCV 86.2 89.5 90.7  PLT 377 391 391   Basic Metabolic Panel:  Recent Labs  Lab 07/19/23 1226 07/23/23 0820 07/23/23 1531 07/24/23 0427  NA 138 138  --  140  K 3.9 4.3  --  4.3  CL 104 104  --  104  CO2 26 29  --  25  GLUCOSE 105* 94  --  143*  BUN 23 24*  --  31*  CREATININE 0.78 0.84  --  0.82  CALCIUM 9.2 9.5  --  9.5  MG  --   --  2.0  --   PHOS  --   --  3.1  --    Recent Labs  Lab 07/19/23 1226 07/23/23 0820 07/24/23 0427  AST 13* 12* 16  ALT 15 10 15   ALKPHOS 157* 147* 126  BILITOT 0.3 0.2 0.2  PROT 7.3 7.4 7.8  ALBUMIN 3.2* 3.2* 2.8*  Sepsis Labs: No results for input(s): "PROCALCITON", "LATICACIDVEN" in the last 168 hours. Recent Results (from the past 240 hours)  Resp panel by RT-PCR (RSV, Flu A&B, Covid) Anterior Nasal Swab     Status: None   Collection Time: 07/23/23 10:16 AM   Specimen: Anterior Nasal Swab  Result Value Ref Range Status   SARS Coronavirus 2 by RT PCR NEGATIVE NEGATIVE Final    Comment: (NOTE) SARS-CoV-2 target nucleic acids are NOT DETECTED.  The SARS-CoV-2 RNA is generally detectable in upper respiratory specimens during the acute phase of infection. The lowest concentration of SARS-CoV-2 viral copies this assay can detect is 138 copies/mL. A negative result does not preclude SARS-Cov-2 infection and should not be used as the sole basis for treatment or other patient management  decisions. A negative result  may occur with  improper specimen collection/handling, submission of specimen other than nasopharyngeal swab, presence of viral mutation(s) within the areas targeted by this assay, and inadequate number of viral copies(<138 copies/mL). A negative result must be combined with clinical observations, patient history, and epidemiological information. The expected result is Negative.  Fact Sheet for Patients:  BloggerCourse.com  Fact Sheet for Healthcare Providers:  SeriousBroker.it  This test is no t yet approved or cleared by the [place] FDA and  has been authorized for detection and/or diagnosis of SARS-CoV-2 by FDA under an Emergency Use Authorization (EUA). This EUA will remain  in effect (meaning this test can be used) for the duration of the COVID-19 declaration under Section 564(b)(1) of the Act, 21 U.S.C.section 360bbb-3(b)(1), unless the authorization is terminated  or revoked sooner.       Influenza A by PCR NEGATIVE NEGATIVE Final   Influenza B by PCR NEGATIVE NEGATIVE Final    Comment: (NOTE) The Xpert Xpress SARS-CoV-2/FLU/RSV plus assay is intended as an aid in the diagnosis of influenza from Nasopharyngeal swab specimens and should not be used as a sole basis for treatment. Nasal washings and aspirates are unacceptable for Xpert Xpress SARS-CoV-2/FLU/RSV testing.  Fact Sheet for Patients: BloggerCourse.com  Fact Sheet for Healthcare Providers: SeriousBroker.it  This test is not yet approved or cleared by the [place] FDA and has been authorized for detection and/or diagnosis of SARS-CoV-2 by FDA under an Emergency Use Authorization (EUA). This EUA will remain in effect (meaning this test can be used) for the duration of the COVID-19 declaration under Section 564(b)(1) of the Act, 21 U.S.C. section 360bbb-3(b)(1), unless the  authorization is terminated or revoked.     Resp Syncytial Virus by PCR NEGATIVE NEGATIVE Final    Comment: (NOTE) Fact Sheet for Patients: BloggerCourse.com  Fact Sheet for Healthcare Providers: SeriousBroker.it  This test is not yet approved or cleared by the [place] FDA and has been authorized for detection and/or diagnosis of SARS-CoV-2 by FDA under an Emergency Use Authorization (EUA). This EUA will remain in effect (meaning this test can be used) for the duration of the COVID-19 declaration under Section 564(b)(1) of the Act, 21 U.S.C. section 360bbb-3(b)(1), unless the authorization is terminated or revoked.  Performed at [facility], [address], [place], [place] [address]     Antimicrobials/Microbiology: Anti-infectives (From admission, onward)    Start     Dose/Rate Route Frequency Ordered Stop   07/24/23 1000  acyclovir (ZOVIRAX) tablet 400 mg        400 mg Oral Daily 07/23/23 1640           Component Value Date/Time   SDES  05/03/2023 6578    BLOOD RIGHT ANTECUBITAL Performed at [facility], [address] [place], [place] [address]    [facility]  05/03/2023 9528    BOTTLES DRAWN AEROBIC AND ANAEROBIC Blood Culture adequate volume Performed at [facility], [address], [place], [place] [address]    CULT  05/03/2023 0137    NO GROWTH 5 DAYS Performed at [facility], [address] [place], [place] [address]    REPTSTATUS 05/08/2023 FINAL 05/03/2023 7253     Radiology Studies: [facility] Chest Port 1 View Result Date: 07/25/2023 CLINICAL DATA:  Chest pain with shortness of breath and cough. EXAM: PORTABLE CHEST 1 VIEW COMPARISON:  07/23/2023 FINDINGS: The cardio pericardial silhouette is enlarged. Similar bibasilar atelectasis or infiltrate with probable small bilateral pleural effusions. No  overt pulmonary edema. Old left-sided rib  fractures noted. Telemetry leads overlie the chest. IMPRESSION: Similar bibasilar atelectasis or infiltrate with probable small bilateral pleural effusions. Electronically Signed   By: Kennith Center M.D.   On: 07/25/2023 09:36   DG Chest Port 1 View Result Date: 07/23/2023 CLINICAL DATA:  Shortness of breath and hypoxia EXAM: PORTABLE CHEST 1 VIEW COMPARISON:  Chest radiograph dated 07/19/2023 FINDINGS: Normal lung volumes. Mild bilateral interstitial opacities and bibasilar patchy and linear opacities. Blunting of the bilateral costophrenic angles. No pneumothorax. Similar enlarged cardiomediastinal silhouette. Old bilateral rib fractures. IMPRESSION: 1. Mild bilateral interstitial opacities and bibasilar patchy and linear opacities, which may represent pulmonary edema or atypical infection. 2. Blunting of the bilateral costophrenic angles, which may represent small pleural effusions. 3. Similar cardiomegaly. Electronically Signed   By: Agustin Cree M.D.   On: 07/23/2023 10:45     LOS: 0 days   Total time spent in review of labs and imaging, patient evaluation, formulation of plan, documentation and communication with family: 5o minutes  Lanae Boast, MD  [facility]  07/25/2023, 9:50 AM

## 2023-07-25 NOTE — Progress Notes (Signed)
   07/25/23 0833  Vitals  Temp (!) 97.5 F (36.4 C)  Temp Source Axillary  BP (!) 142/74  MAP (mmHg) 94  BP Location Left Arm  BP Method Automatic  Patient Position (if appropriate) Lying  Pulse Rate 65  Pulse Rate Source Dinamap  ECG Heart Rate 74  Resp 16  MEWS COLOR  MEWS Score Color Green  Oxygen Therapy  SpO2 98 %  O2 Device Nasal Cannula  O2 Flow Rate (L/min) 3 L/min  Pain Assessment  Pain Scale 0-10  Pain Score 10  Pain Type Acute pain  Pain Location Chest  Pain Orientation Left;Lower  Pain Radiating Towards right chest  Pain Descriptors / Indicators Tightness  Pain Onset Sudden  Pain Intervention(s) Medication (See eMAR)  MEWS Score  MEWS Temp 0  MEWS Systolic 0  MEWS Pulse 0  MEWS RR 0  MEWS LOC 0  MEWS Score 0   Patient complain of 10/10 chest pain. Dyspnea. EKG performed. MD notified. Will continue to monitor.

## 2023-07-25 NOTE — Progress Notes (Signed)
 OT Cancellation Note  Patient Details Name: MELCHOR KIRCHGESSNER MRN: [id number] DOB: 12/02/1945   Cancelled Treatment:    Reason Eval/Treat Not Completed: Confirmed with RN, Patient not medically ready (pending CT angiogram). OT will continue to follow for evaluation.   [employer] Kaimana Lurz 07/25/2023, 3:07 PM  Nyoka Cowden OTR/L Acute Rehabilitation Services Office: [phone]

## 2023-07-26 ENCOUNTER — Observation Stay (HOSPITAL_COMMUNITY): Payer: Medicare Other

## 2023-07-26 DIAGNOSIS — R079 Chest pain, unspecified: Secondary | ICD-10-CM | POA: Diagnosis not present

## 2023-07-26 DIAGNOSIS — I700 Atherosclerosis of aorta: Secondary | ICD-10-CM | POA: Diagnosis not present

## 2023-07-26 DIAGNOSIS — E785 Hyperlipidemia, unspecified: Secondary | ICD-10-CM | POA: Diagnosis not present

## 2023-07-26 DIAGNOSIS — R059 Cough, unspecified: Secondary | ICD-10-CM | POA: Diagnosis not present

## 2023-07-26 DIAGNOSIS — Z79899 Other long term (current) drug therapy: Secondary | ICD-10-CM | POA: Diagnosis not present

## 2023-07-26 DIAGNOSIS — H409 Unspecified glaucoma: Secondary | ICD-10-CM | POA: Diagnosis not present

## 2023-07-26 DIAGNOSIS — Z87891 Personal history of nicotine dependence: Secondary | ICD-10-CM | POA: Diagnosis not present

## 2023-07-26 DIAGNOSIS — J9601 Acute respiratory failure with hypoxia: Secondary | ICD-10-CM | POA: Diagnosis not present

## 2023-07-26 DIAGNOSIS — D649 Anemia, unspecified: Secondary | ICD-10-CM | POA: Diagnosis not present

## 2023-07-26 DIAGNOSIS — C9000 Multiple myeloma not having achieved remission: Secondary | ICD-10-CM

## 2023-07-26 DIAGNOSIS — E876 Hypokalemia: Secondary | ICD-10-CM | POA: Diagnosis not present

## 2023-07-26 DIAGNOSIS — J069 Acute upper respiratory infection, unspecified: Secondary | ICD-10-CM | POA: Diagnosis not present

## 2023-07-26 DIAGNOSIS — Z7982 Long term (current) use of aspirin: Secondary | ICD-10-CM | POA: Diagnosis not present

## 2023-07-26 DIAGNOSIS — I2510 Atherosclerotic heart disease of native coronary artery without angina pectoris: Secondary | ICD-10-CM | POA: Diagnosis not present

## 2023-07-26 DIAGNOSIS — Z1152 Encounter for screening for COVID-19: Secondary | ICD-10-CM | POA: Diagnosis not present

## 2023-07-26 DIAGNOSIS — J45909 Unspecified asthma, uncomplicated: Secondary | ICD-10-CM | POA: Diagnosis not present

## 2023-07-26 DIAGNOSIS — M1711 Unilateral primary osteoarthritis, right knee: Secondary | ICD-10-CM | POA: Diagnosis not present

## 2023-07-26 DIAGNOSIS — N19 Unspecified kidney failure: Secondary | ICD-10-CM | POA: Diagnosis not present

## 2023-07-26 DIAGNOSIS — D638 Anemia in other chronic diseases classified elsewhere: Secondary | ICD-10-CM | POA: Diagnosis not present

## 2023-07-26 DIAGNOSIS — L89302 Pressure ulcer of unspecified buttock, stage 2: Secondary | ICD-10-CM | POA: Diagnosis not present

## 2023-07-26 DIAGNOSIS — M79661 Pain in right lower leg: Secondary | ICD-10-CM | POA: Diagnosis not present

## 2023-07-26 DIAGNOSIS — E8770 Fluid overload, unspecified: Secondary | ICD-10-CM | POA: Diagnosis not present

## 2023-07-26 DIAGNOSIS — Z955 Presence of coronary angioplasty implant and graft: Secondary | ICD-10-CM | POA: Diagnosis not present

## 2023-07-26 LAB — IRON AND TIBC
Iron: 39 ug/dL — ABNORMAL LOW (ref 45–182)
Saturation Ratios: 11 % — ABNORMAL LOW (ref 17.9–39.5)
TIBC: 349 ug/dL (ref 250–450)
UIBC: 310 ug/dL

## 2023-07-26 LAB — PREPARE RBC (CROSSMATCH)

## 2023-07-26 MED ORDER — IBUPROFEN 600 MG PO TABS: 600.0000 mg | ORAL_TABLET | Freq: Four times a day (QID) | ORAL | 0 refills | Status: DC | PRN

## 2023-07-26 MED ORDER — ZOLEDRONIC ACID 4 MG/100ML IV SOLN
4.0000 mg | Freq: Once | INTRAVENOUS | Status: DC
  Filled 2023-07-26: qty 100

## 2023-07-26 MED ORDER — ZOLEDRONIC ACID 4 MG/5ML IV CONC: 4.0000 mg | Freq: Once | INTRAVENOUS | Status: DC

## 2023-07-26 MED ORDER — KETOROLAC TROMETHAMINE 30 MG/ML IJ SOLN
30.0000 mg | Freq: Three times a day (TID) | INTRAMUSCULAR | Status: AC
  Administered 2023-07-26 – 2023-07-27 (×3): 30 mg via INTRAVENOUS
  Filled 2023-07-26 (×3): qty 1

## 2023-07-26 MED ORDER — SODIUM CHLORIDE 0.9% IV SOLUTION: Freq: Once | INTRAVENOUS | Status: DC

## 2023-07-26 MED ORDER — KETOROLAC TROMETHAMINE 30 MG/ML IJ SOLN
30.0000 mg | Freq: Four times a day (QID) | INTRAMUSCULAR | Status: DC | PRN
  Administered 2023-07-26: 30 mg via INTRAVENOUS
  Filled 2023-07-26: qty 1

## 2023-07-26 MED ORDER — LIDOCAINE 5 % EX PTCH
1.0000 | MEDICATED_PATCH | CUTANEOUS | Status: DC
  Administered 2023-07-26: 1 via TRANSDERMAL
  Filled 2023-07-26: qty 1

## 2023-07-26 MED ORDER — IOHEXOL 350 MG/ML SOLN
80.0000 mL | Freq: Once | INTRAVENOUS | Status: AC | PRN
Start: 2023-07-26 — End: 2023-07-26
  Administered 2023-07-26: 80 mL via INTRAVENOUS

## 2023-07-26 MED ORDER — FUROSEMIDE 10 MG/ML IJ SOLN: 30.0000 mg | Freq: Once | INTRAMUSCULAR | Status: DC

## 2023-07-26 MED ORDER — PREDNISONE 20 MG PO TABS: 20.0000 mg | ORAL_TABLET | Freq: Every day | ORAL | Status: DC

## 2023-07-26 MED ORDER — IBUPROFEN 200 MG PO TABS
600.0000 mg | ORAL_TABLET | Freq: Four times a day (QID) | ORAL | Status: DC | PRN
  Administered 2023-07-26: 600 mg via ORAL
  Filled 2023-07-26: qty 3

## 2023-07-26 NOTE — Progress Notes (Signed)
 PROGRESS NOTE Daniel Whitaker  WJX:[id number] DOB: Jul 24, 1945 DOA: 07/23/2023 PCP: [name], MD  Brief Narrative/Hospital Course: 78 year old male with multiple myeloma 127 and chronic anemia, history of CAD, GERD, hypertriglyceridemia impaired fasting glucose, neuropathy nontoxic nodular goiter RLS obesity sent from cancer center to ED due to hypoxia and dyspnea and concern for fluid overload.  Patient having progressively worsening dyspnea 2 to 3 weeks also with wheezing productive cough mild pleuritic chest pain fatigue lower leg edema and orthopnea, has been sleeping on the recliner.  Recently his  furosemide dose was increased and placed on Aldactone without improvement. In the ED vitals stable afebrile SpO2 90% on [place] at 4 L.  Labs showed fairly stable CMP, CBC with chronic anemia 8.6 g RSV COVID influenza negative, low albumin at 3.2. CXR>>Mild bilateral interstitial opacities and bibasilar patchy and linear opacities, which may represent pulmonary edema or atypical infection.  Small pleural effusion and cardiomegaly.  Patient was given iv Lasix 60 mg x 1, Solu-Medrol 40 x 1 and admission requested for further management Patient clinically improved, managed with a steroid along with home diuretic regimen.  PT OT was consulted, overall he is ambulatory, at this time he is stable for discharge home.       Subjective: Seen this am Some pain, he felt lot better after 30 mg toradol yesterday but 15 mg not helping much Overnight patient remained afebrile, BP stable oxygen saturation stable on room air  Labs reviewed hemoglobin stable 8.4 g. CT angio chest still pending -was just done.  Assessment and Plan: Principal Problem:   Acute respiratory failure with hypoxia (HCC) Active Problems:   Multiple myeloma without remission (HCC)   Class 1 obesity due to excess calories in adult   CAD (coronary artery disease)   Hyperlipidemia   Essential hypertension   Fluid overload   Pressure  injury of skin   URI (upper respiratory infection)   Reactive airway disease with wheezing   Glaucoma  Bilateral lateral chest wall pain: Extensive lytic lesions throughout bony thorax with numerous bilateral pathological fractures- some are new. CTA limited but no central PE.X-ray with small bibasilar atelectasis or infiltrate with probable small bilateral pleural effusion. Pain Worse with deep breath -D-dimer likely elevated but troponin again negative x 2, CT angio chest  done today.  Has extensive redness involving lesions throughout the bony thorax, indicating progression of the disease markedly and numerous bilateral rib fractures of varying ages likely pathologic-this is likely causing the patient's chest wall pain and dyspnea, reproducible with palpation. Patient responded fairly with Toradol 30 mg, increased to 10 to 30 mg Toradol with every 6 hours, added lidocaine patch  cont oxy.I have notified oncology to evaluate the patient   Acute hypoxic respiratory failure- at home on PRN/nightly [place]: On RA resolved now  ?Reactive airway disease ?Concern for fluid overload: Patient having progressive symptoms 2 to 3 weeks wheezing cough orthopnea leg edema no. BNP 116 but was high 160 -170 last 5days-3wk, imaging finding concerning for small pleural effusion possibly  pulmonary edema or atypical infection.  Received Solu-Medrol in the ED, and given prednisone  bronchodilators Pulmicort , patient echo 07/20/23- with some limitation- but with normal EF no valvular abnormalities. ? Edema/ mild pleural effusions could be myeloma /hypoalbuminemia related. Lasix Aldactone and steroid and taper off quickly CTA shows findings that explains chest pain dyspnea  Multiple myeloma previously under remission: Followed by Dr. Bertis Ruddy, on dexamethasone once a week on acyclovir for antimicrobial prophylaxis, aspirin for  DVT prophylaxis and lenalidomide on hold due to anemia.Zometa on hold until dental clearance.   CTA shows marked progression of disease-oncology to see    Anemia of chronic disease due to renal failure and chemotherapy: Hb stable above 8 g, f/b hematology.  Lenalidomide on hold Recent Labs  Lab 07/19/23 1226 07/23/23 0820 07/24/23 0427 07/25/23 1133  HGB 8.5* 8.6* 8.3* 8.4*  HCT 26.9* 28.2* 27.4* 28.5*     HTN CAD hx of stent 09/2021 HLD: Troponin negative x 4. . Continue his Coreg, asirin, he is intolerant to statin.Continue diuretics as above. His last cath as below CARDIAC CATH 09/2021 CATH>Severe single-vessel disease with 85% proximal LAD ->  Successful DES PCI using Synergy DES 2.5 mm x 12 mm postdilated/deployed to 2.8 mm.   TIMI-3 flow pre and post.  Reduced to 0%. Otherwise angiographically normal coronary arteries. Normal LVEF with normal EDP   Glaucoma: Continue eyedrops.   Pressure injury:  Continue wound care as below Pressure injury stage II POA's dressing change as below Pressure Injury 07/23/23 Buttocks Medial Stage 2 -  Partial thickness loss of dermis presenting as a shallow open injury with a red, pink wound bed without slough. (Active)  07/23/23 1512  Location: Buttocks  Location Orientation: Medial  Staging: Stage 2 -  Partial thickness loss of dermis presenting as a shallow open injury with a red, pink wound bed without slough.  Wound Description (Comments):   Present on Admission: Yes  Dressing Type Foam - Lift dressing to assess site every shift 07/23/23 2001    DVT prophylaxis: SCDs Start: 07/23/23 1446 Code Status:   Code Status: Full Code Family Communication: plan of care discussed with patient at bedside. Patient status is: Remains hospitalized because of severity of illness Level of care: Progressive   Dispo: The patient is from: Home            Anticipated disposition: TBD Objective: Vitals last 24 hrs: Vitals:   07/26/23 0438 07/26/23 0825 07/26/23 0827 07/26/23 1007  BP: 130/60 (!) 143/64 (!) 143/64 130/60  Pulse: 65 66 66  66  Resp: 20 15  20   Temp: (!) 97.5 F (36.4 C) 98.2 F (36.8 C)  97.7 F (36.5 C)  TempSrc: Oral Oral  Oral  SpO2: 95% 97%  97%  Weight: 105 kg      Weight change:   Physical Examination: General exam: alert awake, oriented HEENT:Oral mucosa moist, Ear/Nose WNL grossly Respiratory system: Bilaterally clear BS,no use of accessory muscle Cardiovascular system: S1 & S2 +, No JVD. Gastrointestinal system: Abdomen soft,NT,ND, BS+ Nervous System: Alert, awake, moving all extremities,and following commands. Extremities: LE edema neg,distal peripheral pulses palpable and warm.  Skin: No rashes,no icterus. MSK: Normal muscle bulk,tone, power   Medications reviewed:  Scheduled Meds:  acyclovir  400 mg Oral Daily   aspirin EC  325 mg Oral Daily   brimonidine  1 drop Left Eye BID   budesonide (PULMICORT) nebulizer solution  0.25 mg Nebulization BID   carvedilol  3.125 mg Oral BID WC   docusate sodium  100 mg Oral BID   dorzolamide-timolol  1 drop Both Eyes BID   furosemide  20 mg Oral q1600   furosemide  40 mg Oral Daily   ipratropium-albuterol  3 mL Nebulization BID   latanoprost  1 drop Both Eyes QHS   pantoprazole  40 mg Oral QAC breakfast   potassium chloride SA  20 mEq Oral Daily   predniSONE  40 mg Oral Q breakfast  pregabalin  100 mg Oral BID   Ensure Max Protein  11 oz Oral Daily   senna-docusate  1 tablet Oral BID   spironolactone  25 mg Oral Daily   Continuous Infusions: Diet Order             Diet Heart Room service appropriate? Yes; Fluid consistency: Thin; Fluid restriction: 1200 mL Fluid  Diet effective now                  Intake/Output Summary (Last 24 hours) at 07/26/2023 1017 Last data filed at 07/26/2023 0600 Gross per 24 hour  Intake 360 ml  Output 2150 ml  Net -1790 ml   Net IO Since Admission: -3,590 mL [07/26/23 1017]  Wt Readings from Last 3 Encounters:  07/26/23 105 kg  07/23/23 104.2 kg  07/19/23 105.2 kg     Unresulted Labs (From  admission, onward)    None     Data Reviewed: I have personally reviewed following labs and imaging studies CBC: Recent Labs  Lab 07/19/23 1226 07/23/23 0820 07/24/23 0427 07/25/23 1133  WBC 8.0 7.2 6.7 9.4  NEUTROABS 6.2 4.2  --   --   HGB 8.5* 8.6* 8.3* 8.4*  HCT 26.9* 28.2* 27.4* 28.5*  MCV 86.2 89.5 90.7 92.8  PLT 377 391 391 368   Basic Metabolic Panel:  Recent Labs  Lab 07/19/23 1226 07/23/23 0820 07/23/23 1531 07/24/23 0427 07/25/23 1133  NA 138 138  --  140 142  K 3.9 4.3  --  4.3 4.1  CL 104 104  --  104 107  CO2 26 29  --  25 28  GLUCOSE 105* 94  --  143* 116*  BUN 23 24*  --  31* 31*  CREATININE 0.78 0.84  --  0.82 0.92  CALCIUM 9.2 9.5  --  9.5 9.5  MG  --   --  2.0  --   --   PHOS  --   --  3.1  --   --    Recent Labs  Lab 07/19/23 1226 07/23/23 0820 07/24/23 0427 07/25/23 1133  AST 13* 12* 16 20  ALT 15 10 15 19   ALKPHOS 157* 147* 126 126  BILITOT 0.3 0.2 0.2 0.4  PROT 7.3 7.4 7.8 7.8  ALBUMIN 3.2* 3.2* 2.8* 2.8*  Sepsis Labs: No results for input(s): "PROCALCITON", "LATICACIDVEN" in the last 168 hours. Recent Results (from the past 240 hours)  Resp panel by RT-PCR (RSV, Flu A&B, Covid) Anterior Nasal Swab     Status: None   Collection Time: 07/23/23 10:16 AM   Specimen: Anterior Nasal Swab  Result Value Ref Range Status   SARS Coronavirus 2 by RT PCR NEGATIVE NEGATIVE Final    Comment: (NOTE) SARS-CoV-2 target nucleic acids are NOT DETECTED.  The SARS-CoV-2 RNA is generally detectable in upper respiratory specimens during the acute phase of infection. The lowest concentration of SARS-CoV-2 viral copies this assay can detect is 138 copies/mL. A negative result does not preclude SARS-Cov-2 infection and should not be used as the sole basis for treatment or other patient management decisions. A negative result may occur with  improper specimen collection/handling, submission of specimen other than nasopharyngeal swab, presence of viral  mutation(s) within the areas targeted by this assay, and inadequate number of viral copies(<138 copies/mL). A negative result must be combined with clinical observations, patient history, and epidemiological information. The expected result is Negative.  Fact Sheet for Patients:  BloggerCourse.com  Fact Sheet for  Healthcare Providers:  SeriousBroker.it  This test is no t yet approved or cleared by the [place] and  has been authorized for detection and/or diagnosis of SARS-CoV-2 by FDA under an Emergency Use Authorization (EUA). This EUA will remain  in effect (meaning this test can be used) for the duration of the COVID-19 declaration under Section 564(b)(1) of the Act, 21 U.S.C.section 360bbb-3(b)(1), unless the authorization is terminated  or revoked sooner.       Influenza A by PCR NEGATIVE NEGATIVE Final   Influenza B by PCR NEGATIVE NEGATIVE Final    Comment: (NOTE) The Xpert Xpress SARS-CoV-2/FLU/RSV plus assay is intended as an aid in the diagnosis of influenza from Nasopharyngeal swab specimens and should not be used as a sole basis for treatment. Nasal washings and aspirates are unacceptable for Xpert Xpress SARS-CoV-2/FLU/RSV testing.  Fact Sheet for Patients: BloggerCourse.com  Fact Sheet for Healthcare Providers: SeriousBroker.it  This test is not yet approved or cleared by the [place] FDA and has been authorized for detection and/or diagnosis of SARS-CoV-2 by FDA under an Emergency Use Authorization (EUA). This EUA will remain in effect (meaning this test can be used) for the duration of the COVID-19 declaration under Section 564(b)(1) of the Act, 21 U.S.C. section 360bbb-3(b)(1), unless the authorization is terminated or revoked.     Resp Syncytial Virus by PCR NEGATIVE NEGATIVE Final    Comment: (NOTE) Fact Sheet for  Patients: BloggerCourse.com  Fact Sheet for Healthcare Providers: SeriousBroker.it  This test is not yet approved or cleared by the [place] FDA and has been authorized for detection and/or diagnosis of SARS-CoV-2 by FDA under an Emergency Use Authorization (EUA). This EUA will remain in effect (meaning this test can be used) for the duration of the COVID-19 declaration under Section 564(b)(1) of the Act, 21 U.S.C. section 360bbb-3(b)(1), unless the authorization is terminated or revoked.  Performed at [facility], [address] [place], [place] [address]     Antimicrobials/Microbiology: Anti-infectives (From admission, onward)    Start     Dose/Rate Route Frequency Ordered Stop   07/24/23 1000  acyclovir (ZOVIRAX) tablet 400 mg        400 mg Oral Daily 07/23/23 1640           Component Value Date/Time   SDES  05/03/2023 6045    BLOOD RIGHT ANTECUBITAL Performed at [facility], [address], [place], [place] [address]    [facility]  05/03/2023 1914    BOTTLES DRAWN AEROBIC AND ANAEROBIC Blood Culture adequate volume Performed at [facility], [address], [place], [place] [address]    CULT  05/03/2023 0137    NO GROWTH 5 DAYS Performed at [facility], [address] [place], [place] [address]    REPTSTATUS 05/08/2023 FINAL 05/03/2023 8657     Radiology Studies: [facility] Chest Port 1 View Result Date: 07/25/2023 CLINICAL DATA:  Chest pain with shortness of breath and cough. EXAM: PORTABLE CHEST 1 VIEW COMPARISON:  07/23/2023 FINDINGS: The cardio pericardial silhouette is enlarged. Similar bibasilar atelectasis or infiltrate with probable small bilateral pleural effusions. No overt pulmonary edema. Old left-sided rib fractures noted. Telemetry leads overlie the chest. IMPRESSION: Similar bibasilar atelectasis or infiltrate with probable small  bilateral pleural effusions. Electronically Signed   By: Kennith Center M.D.   On: 07/25/2023 09:36     LOS: 0 days   Total time spent in review of labs and imaging, patient evaluation, formulation of  plan, documentation and communication with family: 5o minutes  Lanae Boast, MD  [facility]  07/26/2023, 10:17 AM

## 2023-07-26 NOTE — Progress Notes (Signed)
 PT Cancellation Note  Patient Details Name: Daniel Whitaker MRN: [id number] DOB: Jul 28, 1945   Cancelled Treatment:    Reason Eval/Treat Not Completed: Patient at procedure or test/unavailable    Faye Ramsay, PT Acute Rehabilitation  Office: [phone]

## 2023-07-26 NOTE — Progress Notes (Addendum)
 Daniel Whitaker   DOB:10/14/1945   ZO#:[id number]      ASSESSMENT & PLAN:  1.  Multiple myeloma without remission -Initially diagnosed 07/11/2013 - CT angio chest done today 07/26/23 shows extensive lucent bone lesions throughout the bony thorax compatible with history of multiple myeloma.  Also shows marked progression of disease. - Patient had a recent myeloma panel which showed positive response to therapy.  Currently on dexamethasone 20 mg once a week/every Friday and patient's granddaughter states this has been reduced weekly.  - Lenalidomide is on hold - Has been on viral and bacterial prophylaxis - Follows with /medical oncology Dr. Bertis Ruddy.  Patient was most recently seen in outpatient oncology office on 07/23/2023.  Dr. Bertis Ruddy will determine further treatment plans.  Patient and family agreeable. - Medical oncology/Dr. Myna Hidalgo will follow today.  2.  Anemia, normocytic -Chronic - Likely due to malignancy, chronic disease - Hemoglobin 8.4 on 07/25/2023.  Baseline hemoglobin seems to be in the 8 range. -Transfuse PRBC for Hgb <7.0.  No transfusional intervention warranted at this time. - Monitor CBC with differential  3.  Hypoxia/dyspnea Fluid overload - Patient has had progressively worsening dyspnea for about 3 weeks with progressive cough and mild pleuritic chest pain - Significant improvement of pleuritic chest pain and shortness of breath stated and noted today - Status post IV diuretics with good results - On O2 at home - Supportive care    Code Status Full  Subjective:  Patient seen awake and alert sitting up in chair at bedside.  Patient's wife also at bedside with granddaughter on the phone.  Reports improvement in breathing and pleuritic chest pain.  Also complains of pain right lower extremity since mobility has decreased.  Asking when he can go home however agreeable to lower extremity x-ray and waiting to see Dr. Myna Hidalgo.  No other acute complaints  offered.  Objective:  Vitals:   07/26/23 1007 07/26/23 1214  BP: 130/60 137/71  Pulse: 66 81  Resp: 20 20  Temp: 97.7 F (36.5 C) (!) 97.4 F (36.3 C)  SpO2: 97% 96%     Intake/Output Summary (Last 24 hours) at 07/26/2023 1247 Last data filed at 07/26/2023 0600 Gross per 24 hour  Intake 360 ml  Output 2150 ml  Net -1790 ml     REVIEW OF SYSTEMS:   Constitutional: +pain right lower extremity, denies fevers, chills or abnormal night sweats Eyes: Denies blurriness of vision, double vision or watery eyes Ears, nose, mouth, throat, and face: Denies mucositis or sore throat Respiratory: + Shortness of breath Cardiovascular: + Pleuritic chest pain  Gastrointestinal:  Denies nausea, heartburn or change in bowel habits Skin: Denies abnormal skin rashes Lymphatics: Denies new lymphadenopathy or easy bruising Neurological: Denies numbness, tingling or new weaknesses Behavioral/Psych: Mood is stable, no new changes  All other systems were reviewed with the patient and are negative.  PHYSICAL EXAMINATION: ECOG PERFORMANCE STATUS: 1 - Symptomatic but completely ambulatory  Vitals:   07/26/23 1007 07/26/23 1214  BP: 130/60 137/71  Pulse: 66 81  Resp: 20 20  Temp: 97.7 F (36.5 C) (!) 97.4 F (36.3 C)  SpO2: 97% 96%   Filed Weights   07/26/23 0438  Weight: 231 lb 7.7 oz (105 kg)    GENERAL: alert, no distress and comfortable SKIN: + Pale skin color, texture, turgor are normal, no rashes or significant lesions EYES: normal, conjunctiva are pink and non-injected, sclera clear OROPHARYNX: no exudate, no erythema and lips, buccal mucosa, and tongue normal  NECK: supple, thyroid normal size, non-tender, without nodularity LYMPH: no palpable lymphadenopathy in the cervical, axillary or inguinal LUNGS: + Diminished to auscultation with no Rales or wheezing noted HEART: regular rate & rhythm and no murmurs and no lower extremity edema ABDOMEN: abdomen soft, non-tender and normal  bowel sounds MUSCULOSKELETAL: no cyanosis of digits and no clubbing  PSYCH: alert & oriented x 3 with fluent speech NEURO: no focal motor/sensory deficits   All questions were answered. The patient knows to call the clinic with any problems, questions or concerns.   The total time spent in the appointment was 40 minutes encounter with patient including review of chart and various tests results, discussions about plan of care and coordination of care plan  Dawson Bills, NP 07/26/2023 12:47 PM    Labs Reviewed:  Lab Results  Component Value Date   WBC 9.4 07/25/2023   HGB 8.4 (L) 07/25/2023   HCT 28.5 (L) 07/25/2023   MCV 92.8 07/25/2023   PLT 368 07/25/2023   Recent Labs    07/23/23 0820 07/24/23 0427 07/25/23 1133  NA 138 140 142  K 4.3 4.3 4.1  CL 104 104 107  CO2 29 25 28   GLUCOSE 94 143* 116*  BUN 24* 31* 31*  CREATININE 0.84 0.82 0.92  CALCIUM 9.5 9.5 9.5  GFRNONAA >60 >60 >60  PROT 7.4 7.8 7.8  ALBUMIN 3.2* 2.8* 2.8*  AST 12* 16 20  ALT 10 15 19   ALKPHOS 147* 126 126  BILITOT 0.2 0.2 0.4    Studies Reviewed:  CT Angio Chest Pulmonary Embolism (PE) W or WO Contrast Result Date: 07/26/2023 CLINICAL DATA:  Evaluate for acute pulmonary embolism. High probability. Chest pain with shortness of breath and cough. History of multiple myeloma. EXAM: CT ANGIOGRAPHY CHEST WITH CONTRAST TECHNIQUE: Multidetector CT imaging of the chest was performed using the standard protocol during bolus administration of intravenous contrast. Multiplanar CT image reconstructions and MIPs were obtained to evaluate the vascular anatomy. RADIATION DOSE REDUCTION: This exam was performed according to the departmental dose-optimization program which includes automated exposure control, adjustment of the mA and/or kV according to patient size and/or use of iterative reconstruction technique. CONTRAST:  80mL OMNIPAQUE IOHEXOL 350 MG/ML SOLN COMPARISON:  05/03/2023 FINDINGS: Cardiovascular: There is  satisfactory opacification of the pulmonary arteries. However, there is excessive motion artifact which diminishes exam detail beyond the level of the lobar pulmonary arteries. Within these limitations, no central obstructing pulmonary embolus identified. Aortic atherosclerosis. Coronary artery calcifications. Mediastinum/Nodes: Thyroid gland, trachea, and esophagus are unremarkable. No enlarged mediastinal, hilar, or axillary adenopathy. Lungs/Pleura: Motion artifact diminishes exam detail. Subsegmental atelectasis is identified within both lower lobes with associated volume loss. No significant pleural effusion or airspace consolidation. No pneumothorax identified. Upper Abdomen: No acute abnormality. Musculoskeletal: There is extensive lucent bone lesions identified throughout the bony thorax compatible with history of multiple myeloma. Compared with the previous exam there is been marked progression of disease with increase multiplicity and size of previously noted lucent bone lesions. As noted previously there are numerous bilateral rib fractures of varying ages many of which are likely pathologic in nature given the extent multiple myeloma. Compared with the previous exam some of these fractures are new for example, there is a new fracture involving the posterolateral aspect of the left tenth rib, image 120/6. Review of the MIP images confirms the above findings. IMPRESSION: 1. Exam is limited by excessive motion artifact which diminishes exam detail beyond the level of the lobar pulmonary  arteries. Within these limitations, no central obstructing pulmonary embolus identified. 2. Extensive lucent bone lesions identified throughout the bony thorax compatible with history of multiple myeloma. Compared with the previous exam there has been marked progression of disease with increase multiplicity and size of previously noted lucent bone lesions. 3. Numerous bilateral rib fractures of varying ages, some of which  are likely pathologic in nature given the extent multiple myeloma. Compared with the previous exam some of these fractures are new none. 4. Coronary artery calcifications. 5.  Aortic Atherosclerosis (ICD10-I70.0). Electronically Signed   By: Signa Kell M.D.   On: 07/26/2023 10:37   DG Chest Port 1 View Result Date: 07/25/2023 CLINICAL DATA:  Chest pain with shortness of breath and cough. EXAM: PORTABLE CHEST 1 VIEW COMPARISON:  07/23/2023 FINDINGS: The cardio pericardial silhouette is enlarged. Similar bibasilar atelectasis or infiltrate with probable small bilateral pleural effusions. No overt pulmonary edema. Old left-sided rib fractures noted. Telemetry leads overlie the chest. IMPRESSION: Similar bibasilar atelectasis or infiltrate with probable small bilateral pleural effusions. Electronically Signed   By: Kennith Center M.D.   On: 07/25/2023 09:36   DG Chest Port 1 View Result Date: 07/23/2023 CLINICAL DATA:  Shortness of breath and hypoxia EXAM: PORTABLE CHEST 1 VIEW COMPARISON:  Chest radiograph dated 07/19/2023 FINDINGS: Normal lung volumes. Mild bilateral interstitial opacities and bibasilar patchy and linear opacities. Blunting of the bilateral costophrenic angles. No pneumothorax. Similar enlarged cardiomediastinal silhouette. Old bilateral rib fractures. IMPRESSION: 1. Mild bilateral interstitial opacities and bibasilar patchy and linear opacities, which may represent pulmonary edema or atypical infection. 2. Blunting of the bilateral costophrenic angles, which may represent small pleural effusions. 3. Similar cardiomegaly. Electronically Signed   By: Agustin Cree M.D.   On: 07/23/2023 10:45   ECHOCARDIOGRAM LIMITED Result Date: 07/20/2023    ECHOCARDIOGRAM LIMITED REPORT   Patient Name:   Daniel Whitaker Date of Exam: 07/20/2023 Medical Rec #:  [id number]        Height:       72.0 in Accession #:    [id number]       Weight:       232.0 lb Date of Birth:  Dec 16, 1945       BSA:          2.269 m  Patient Age:    77 years         BP:           160/68 mmHg Patient Gender: M                HR:           78 bpm. Exam Location:  [address] Procedure: Limited Echo, Cardiac Doppler, Limited Color Doppler and 3D Echo Indications:    I50.9 CHF  History:        Patient has prior history of Echocardiogram examinations, most                 recent 11/05/2020. CAD, Signs/Symptoms:Dyspnea and Fatigue; Risk                 Factors:HLD.  Sonographer:    Clearence Ped RCS Referring Phys: [id number] Bettey Mare LA[place]ENCE IMPRESSIONS  1. Limited study for LV function; not all views obtained; MR not well assessed.  2. Left ventricular ejection fraction, by estimation, is 60 to 65%. The left ventricle has normal function. The left ventricle has no regional wall motion abnormalities. Left ventricular diastolic parameters were normal.  3. Right ventricular systolic  function is normal. The right ventricular size is normal.  4. The mitral valve is normal in structure. Mild to moderate mitral valve regurgitation. No evidence of mitral stenosis.  5. The aortic valve is tricuspid. Aortic valve regurgitation is not visualized. Aortic valve sclerosis is present, with no evidence of aortic valve stenosis.  6. Aortic dilatation noted. There is mild dilatation of the ascending aorta, measuring 43 mm. FINDINGS  Left Ventricle: Left ventricular ejection fraction, by estimation, is 60 to 65%. The left ventricle has normal function. The left ventricle has no regional wall motion abnormalities. The left ventricular internal cavity size was normal in size. There is  no left ventricular hypertrophy. Left ventricular diastolic parameters were normal. Right Ventricle: The right ventricular size is normal. Right ventricular systolic function is normal. Left Atrium: Left atrial size was normal in size. Right Atrium: Right atrial size was normal in size. Pericardium: There is no evidence of pericardial effusion. Mitral Valve: The mitral valve is normal in  structure. Mild to moderate mitral valve regurgitation. No evidence of mitral valve stenosis. Tricuspid Valve: The tricuspid valve is normal in structure. Tricuspid valve regurgitation is mild . No evidence of tricuspid stenosis. Aortic Valve: The aortic valve is tricuspid. Aortic valve regurgitation is not visualized. Aortic valve sclerosis is present, with no evidence of aortic valve stenosis. Pulmonic Valve: The pulmonic valve was normal in structure. Aorta: Aortic dilatation noted. There is mild dilatation of the ascending aorta, measuring 43 mm. Additional Comments: Limited study for LV function; not all views obtained; MR not well assessed.  LEFT VENTRICLE PLAX 2D LVIDd:         4.30 cm   Diastology LVIDs:         3.00 cm   LV e' medial:    9.90 cm/s LV PW:         1.20 cm   LV E/e' medial:  10.3 LV IVS:        1.00 cm   LV e' lateral:   14.30 cm/s LVOT diam:     2.10 cm   LV E/e' lateral: 7.1 LVOT Area:     3.46 cm                           3D Volume EF:                          3D EF:        61 %                          LV EDV:       126 ml                          LV ESV:       49 ml                          LV SV:        77 ml RIGHT VENTRICLE RVSP:           35.0 mmHg LEFT ATRIUM         Index       RIGHT ATRIUM LA diam:    4.00 cm 1.76 cm/m  RA Pressure: 3.00 mmHg   AORTA Ao Root diam: 3.40 cm Ao Asc diam:  4.30 cm MITRAL VALVE                TRICUSPID VALVE MV Area (PHT):              TR Peak grad:   32.0 mmHg MV Decel Time:              TR Vmax:        283.00 cm/s MV E velocity: 102.00 cm/s  Estimated RAP:  3.00 mmHg MV A velocity: 88.60 cm/s   RVSP:           35.0 mmHg MV E/A ratio:  1.15                             SHUNTS                             Systemic Diam: 2.10 cm Olga Millers MD Electronically signed by Olga Millers MD Signature Date/Time: 07/20/2023/1:05:58 PM    Final    DG Chest 2 View Result Date: 07/19/2023 CLINICAL DATA:  Shortness of breath with peripheral edema EXAM: CHEST -  2 VIEW COMPARISON:  05/24/2023, PET CT 04/22/2023 FINDINGS: Cardiomegaly with small bilateral effusions, similar on the left slightly increased on the right. Mild sclerosis at left sixth rib, likely due to previously noted fracture. Possible acute left eighth posterolateral rib fracture compared to prior. Numerous additional remote fractures are noted. IMPRESSION: 1. Cardiomegaly with small bilateral effusions, similar on the left slightly increased on the right. 2. Possible acute left eighth posterolateral rib fracture compared to prior. Multiple remote bilateral rib fractures Electronically Signed   By: Jasmine Pang M.D.   On: 07/19/2023 17:39     ADDENDUM: I saw and examined Mr. Markos.  He certainly does not look like he is 78 years old.  He has IgA kappa myeloma.  He is on standard protocol with D-VRd.  He has had some dosage adjustments.  He is followed, incredibly expertly, by Dr. Bertis Ruddy.  His last myeloma studies look like he was responding.  His M spike was down to 1.2 g/dL.  His IgA level was down to 1200 mg/dL.  The Kappa light chain was down to 19.4 mg/dL.  He was admitted because of increasing pain and shortness of breath.  He had a CT angiogram of the chest which was negative for any pulmonary embolism.  The echocardiogram of his heart which showed a good ejection fraction of 60-65%.  There is no wall thickening.  There is no regional wall motion abnormality.  However, on the CT angiogram, there was evidence that his bones appear to be more brittle.  Look like he had some fractures.  Again the skin certainly could be from myeloma.  I think he probably would benefit from another PET scan.  Unfortunate this cannot be done in the hospital.  He is not on a bisphosphonate.  I know that Dr. Bertis Ruddy has been waiting for dental clearance.  I think that the benefits of Zometa outweigh the risks of him having dental issues.  I also believe that he would benefit from a transfusion.  I suspect  that he may have an element of high-output heart failure from being anemic.  I really do not think that a transfusion would have a risk for him.  I think there would be a clear benefit for transfusion.  I do not think it  be a bad idea to check iron studies on him.  I had a long talk with his family.  They are incredibly grateful for the wonderful care that he is getting from Dr. Bertis Ruddy.  I think he is post to see her on Friday.  Again, I really think that he would benefit from a transfusion.  I will give him 1 unit of packed red blood cells.  I talked to them about the benefits and risks.  I think the benefit is clearly seen in this situation.  Hopefully, we should be able to get him out on the hospital tomorrow.  The ultimate goal for his myeloma is to get him to stem cell transplant.  Again he does not look his age.  He certainly looks quite fit.  I just feel bad that he is having this issue.  Again, I think if he has high-output heart failure, a transfusion will help him.   Christin Bach, MD  Fayrene Fearing 1:5

## 2023-07-26 NOTE — Plan of Care (Signed)
 ?  Problem: Clinical Measurements: ?Goal: Will remain free from infection ?Outcome: Progressing ?  ?

## 2023-07-26 NOTE — Progress Notes (Signed)
 Pt, wife and daughter all request DC. Pt stated he does not want to see Dr Myna Hidalgo here. He prefers to DC home and see his primary oncologist Friday as scheduled. Dr Jonathon Bellows notified.

## 2023-07-26 NOTE — Evaluation (Signed)
 Occupational Therapy Evaluation Patient Details Name: Daniel Whitaker MRN: [id number] DOB: Dec 04, 1945 Today's Date: 07/26/2023   History of Present Illness   Patient is a 78 year old male who returned to [facility], sent from cancer center to ED on 07/23/23 due to hypoxia and dyspnea and concern for fluid overload.   Patient was previously admitted in December 2024  with hypercalcemia, and rib fractures. PMH: recent diagnosis of multiple myeloma, MGUS, cataracts, glaucoma, obesity, RLS, spinal stenosis, neuropathy.     Clinical Impressions Patient is currently requiring as high as minimal assistance with basic ADLs,  contact guard assist with functional transfers to bathroom with use of his rollator.   Current level of function is below patient's typical baseline.    During this evaluation, patient was limited by generalized weakness, impaired activity tolerance, and RT calf pain which had some relief with ambulation per pt report, all of which has the potential to impact patient's and/or caregivers' safety and independence during functional mobility, as well as performance for ADLs.    Patient lives with his spouse and daughter who able to provide 24/7 supervision and assistance.  Patient demonstrates good rehab potential, and should benefit from continued skilled occupational therapy services while in acute care to maximize safety, independence and quality of life at home.  Continued occupational therapy services are recommended in acute care. No post-acute OT indicated.   ?      If plan is discharge home, recommend the following:   A little help with walking and/or transfers;A little help with bathing/dressing/bathroom;Assistance with cooking/housework;Assist for transportation     Functional Status Assessment   Patient has had a recent decline in their functional status and demonstrates the ability to make significant improvements in function in a reasonable and predictable amount of  time.     Equipment Recommendations   None recommended by OT     Recommendations for Other Services         Precautions/Restrictions   Precautions Precautions: Fall Precaution/Restrictions Comments: Mon HR and O2 Restrictions Weight Bearing Restrictions Per Provider Order: No     Mobility Bed Mobility               General bed mobility comments: Pt received in recliner.    Transfers   Equipment used: Rollator (4 wheels)                      Balance Overall balance assessment: Mild deficits observed, not formally tested                                         ADL either performed or assessed with clinical judgement   ADL Overall ADL's : Needs assistance/impaired Eating/Feeding: Modified independent     Grooming Details (indicate cue type and reason): Pt reoprts at home he can stand for some grooming and sits in rollator to shave in order to conserve energy. Upper Body Bathing: Set up;Sitting   Lower Body Bathing: Minimal assistance;Sitting/lateral leans;Sit to/from stand Lower Body Bathing Details (indicate cue type and reason): Due to fatigue Upper Body Dressing : Set up;Sitting   Lower Body Dressing: Sitting/lateral leans;Set up Lower Body Dressing Details (indicate cue type and reason): able to demonstrate figure 4 with RT and LT LEs and doff/don shoes without assistance. Toilet Transfer: Rollator (4 wheels);Contact guard assist;Cueing for safety;Ambulation Toilet Transfer Details (indicate cue type and reason):  Cues to work Statistician for safety.   Toileting - Clothing Manipulation Details (indicate cue type and reason): Not observed as pt denied need to void. Anticipate no more than CGA.     Functional mobility during ADLs: Supervision/safety;Contact guard assist;Cueing for safety;Rollator (4 wheels)       Vision   Vision Assessment?: No apparent visual deficits     Perception         Praxis          Pertinent Vitals/Pain Pain Assessment Pain Assessment: 0-10 Pain Score: 10-Worst pain ever Pain Location: RT calf Pain Descriptors / Indicators: Penetrating, Aching Pain Intervention(s): Premedicated before session, Monitored during session, Limited activity within patient's tolerance     Extremity/Trunk Assessment Upper Extremity Assessment Upper Extremity Assessment: Overall WFL for tasks assessed   Lower Extremity Assessment Lower Extremity Assessment: Defer to PT evaluation       Communication Communication Communication: No apparent difficulties   Cognition Arousal: Alert Behavior During Therapy: WFL for tasks assessed/performed               OT - Cognition Comments: Ox4                 Following commands: Intact       Cueing  General Comments          Exercises     Shoulder Instructions      Home Living Family/patient expects to be discharged to:: Private residence Living Arrangements: Spouse/significant other Available Help at Discharge: Family;Available 24 hours/day Type of Home: House Home Access: Stairs to enter [employer] of Steps: 2 Entrance Stairs-Rails: None Home Layout: One level     Bathroom Shower/Tub: Producer, television/film/video: Handicapped height     Home Equipment: Grab bars - tub/shower;Grab bars - toilet;Shower seat - built in;Rollator (4 wheels)   Additional Comments: Staying at daughters's home due to being a 1 story home. Pt sleeps in a lift recliner at daughter's home      Prior Functioning/Environment Prior Level of Function : Independent/Modified Independent;Driving (below history is since pt returned home after prior admission in Dec 2024)             Mobility Comments: Uses Rollator at all times.  Had to stop outpatient OT ~2 weeks ago due to deteriorating health. supervised with ambulation. ADLs Comments: Supervised with showers and wife helps dry feet.    OT Problem List:  Impaired balance (sitting and/or standing);Decreased activity tolerance;Decreased safety awareness;Pain;Cardiopulmonary status limiting activity   OT Treatment/Interventions:        OT Goals(Current goals can be found in the care plan section)   Acute Rehab OT Goals Patient Stated Goal: Increase endurance and manage RLE pain OT Goal Formulation: With patient/family Time For Goal Achievement: 08/09/23 Potential to Achieve Goals: Good ADL Goals Pt Will Perform Grooming: with modified independence;standing (Tolerating at least 2 grooming tasks standing.) Pt Will Transfer to Toilet: with modified independence;ambulating Pt Will Perform Toileting - Clothing Manipulation and hygiene: with modified independence;sitting/lateral leans Additional ADL Goal #1: Patient will identify at least 3 energy conservation strategies to employ at home in order to maximize function and quality of life and decrease caregiver burden while preventing exacerbation of symptoms and rehospitalization. Additional ADL Goal #2: Pt will engage in 2 full ADLs with mix of sitting and standing functional activities without loss of balance, and with RPE no greater than 5/10 in order to demonstrate improved activity tolerance and balance needed to  perform ADLs safely at home. Additional ADL Goal #3: With VSS, Pt will score at least an 11 on the 30 Second Sit to Stand chair test, modified and documented as needed for use of UEs, in order to demonstrate improved activity tolerance and low fall risk for home.   OT Frequency:       Co-evaluation              AM-PAC OT "6 Clicks" Daily Activity     Outcome Measure Help from another person eating meals?: None Help from another person taking care of personal grooming?: A Little Help from another person toileting, which includes using toliet, bedpan, or urinal?: A Little Help from another person bathing (including washing, rinsing, drying)?: A Little Help from another person  to put on and taking off regular upper body clothing?: A Little Help from another person to put on and taking off regular lower body clothing?: A Little 6 Click Score: 19   End of Session Equipment Utilized During Treatment: Rollator (4 wheels) Nurse Communication: Other (comment) (Coordinated care with RN)  Activity Tolerance: Patient limited by fatigue Patient left: with chair alarm set;with call bell/phone within reach;with family/visitor present  OT Visit Diagnosis: Pain;Unsteadiness on feet (R26.81) (decreased adls) Pain - Right/Left: Right Pain - part of body: Leg                Time: 6578-4696 OT Time Calculation (min): 21 min Charges:  OT General Charges $OT Visit: 1 Visit OT Evaluation $OT Eval Low Complexity: 1 Low  Villa Burgin, OT Acute Rehab Services Office: [phone] 07/26/2023   Theodoro Clock 07/26/2023, 1:31 PM

## 2023-07-26 NOTE — Discharge Summary (Signed)
 Physician Discharge Summary  Daniel Whitaker ZOX:[id number] DOB: [birth year] DOA: 07/23/2023  PCP: [name], MD  Admit date: 07/23/2023 Discharge date: 07/27/2023 Recommendations for Outpatient Follow-up:  Follow up with PCP in 1 weeks-call for appointment Please obtain BMP/CBC in one week Fu w/ Dr Bertis Ruddy in 2 days W/ Cardio- fu arrange I n1 wk  Discharge Dispo: Home Discharge Condition: Stable Code Status:   Code Status: Full Code Diet recommendation:  Diet Order             Diet Heart Room service appropriate? Yes; Fluid consistency: Thin; Fluid restriction: 1200 mL Fluid  Diet effective now                    Brief/Interim Summary: 78 year old male with multiple myeloma 127 and chronic anemia, history of CAD, GERD, hypertriglyceridemia impaired fasting glucose, neuropathy nontoxic nodular goiter RLS obesity sent from cancer center to ED due to hypoxia and dyspnea and concern for fluid overload.  Patient having progressively worsening dyspnea 2 to 3 weeks also with wheezing productive cough mild pleuritic chest pain fatigue lower leg edema and orthopnea, has been sleeping on the recliner.  Recently his  furosemide dose was increased and placed on Aldactone without improvement. In the ED vitals stable afebrile SpO2 90% on [place] at 4 L.  Labs showed fairly stable CMP, CBC with chronic anemia 8.6 g RSV COVID influenza negative, low albumin at 3.2. CXR>>Mild bilateral interstitial opacities and bibasilar patchy and linear opacities, which may represent pulmonary edema or atypical infection.  Small pleural effusion and cardiomegaly. Patient was given iv Lasix 60 mg x 1, Solu-Medrol 40 x 1 and admission requested for further management Patient clinically improved, managed steroidx few doses PO along with home diuretic regimen. PT OT was consulted, overall he is ambulatory, off oxygen.  He had episode of chest pain mostly pleuritic but it is on bilateral lateral chest walls-tender to  palpation, troponin x 2 negative EKG normal sinus rhythm no ST or T wave changes no indication of ACS.D-dimer elevated> CTA showed progression of myeloma and new/old fractures as below likely cause of patient's presentation including dyspnea on exertion chest pain shortness of breath: CTA>> Exam is limited by excessive motion artifact which diminishes exam detail beyond the level of the lobar pulmonary arteries. Within these limitations, no central obstructing pulmonary embolus identified.  Extensive lucent bone lesions identified throughout the bony thorax compatible with history of multiple myeloma. Compared with the previous exam there has been marked progression of disease with increase multiplicity and size of previously noted lucent bone lesions. Numerous bilateral rib fractures of varying ages, some of which are likely pathologic in nature given the extent multiple myeloma. Compared with the previous exam some of these fractures are new none. Seen by oncology team and myself W/ oncology team in the room with patient, his wife and granddaughter on the phone> WAS informed of finding on the CT scan the extensive lucent bone lesions multiple fractures 1 unit PRBC ordered by oncology team and monitor overnight.  Zometa was ordered by Dr Myna Hidalgo but patient and patient's grandson refused and they want to discuss with Dr Bertis Ruddy in 1-2 day about that. His leg edema is improved no more chest pains or dyspnea, he is doing well on room air. He is requesting for discharge this morning.  Discharge Diagnoses:  Principal Problem:   Acute respiratory failure with hypoxia (HCC) Active Problems:   Multiple myeloma without remission (HCC)   Class 1 obesity  due to excess calories in adult   CAD (coronary artery disease)   Hyperlipidemia   Essential hypertension   Fluid overload   Pressure injury of skin   URI (upper respiratory infection)   Reactive airway disease with wheezing   Glaucoma  Bilateral  lateral chest wall pain: Extensive lytic lesions throughout bony thorax with numerous bilateral pathological fractures- some are new. CTA limited but no central PE.X-ray with small bibasilar atelectasis or infiltrate with probable small bilateral pleural effusion. Pain Worse with deep breath -D-dimer likely elevated but troponin again negative x 2, EKG NONISCHEMIC , no ST or T wave changes , di dimer elevated some>CT angio chest  done today>has extensive lucency in bones involving lesions throughout the bony thorax, indicating progression of the disease markedly and numerous bilateral rib fractures of varying ages likely pathologic-this is likely causing the patient's chest wall pain and dyspnea, reproducible with palpation.  His pain in fact improved with Toradol 30 mg and transitioned to NSAIDs for discharge  (explained the side effects of NSAIDs  and how to minimize ) Cont home diuretic regimen.PT OT was consulted, overall he is ambulatory, off oxygen. Oncology was consulted. But he does not want to be seen by Oncology and requesting for dc home asap today    Acute hypoxic respiratory failure- at home on PRN/nightly [place]: On RA resolved now.  No large PE on CTA, he did have recent duplex of right leg negative for DVT, does not want another duplex advised to fu with Dr Bertis Ruddy.Marland Kitchen   ?Reactive airway disease ?Concern for fluid overload: Patient having progressive symptoms 2 to 3 weeks wheezing cough orthopnea leg edema no. BNP 116 but was high 160 -170 last 5days-3wk, imaging finding concerning for small pleural effusion possibly  pulmonary edema or atypical infection.  Received Solu-Medrol in the ED, and given prednisone  bronchodilators Pulmicort , patient echo 07/20/23- with some limitation- but with normal EF no valvular abnormalities. ? Edema/ mild pleural effusions could be myeloma /hypoalbuminemia related. Cont home Lasix Aldacton. see CTA findings as above   Multiple myeloma not under  remission: Followed by Dr. Bertis Ruddy, on dexamethasone once a week on acyclovir for antimicrobial prophylaxis, aspirin for DVT prophylaxis and lenalidomide on hold due to anemia.Zometa on hold until dental clearance.  CTA shows marked progression of disease per oncology>advised zometa, which grandson refused. Given 1 unit prbc. They want to discuss further treatment option  w/ Dr Bertis Ruddy   Anemia of chronic disease due to renal failure and chemotherapy: Hb stable post 1 unit prbc, f/b hematology.  Lenalidomide on hold Recent Labs  Lab 07/23/23 0820 07/24/23 0427 07/25/23 1133 07/27/23 0345  HGB 8.6* 8.3* 8.4* 8.8*  HCT 28.2* 27.4* 28.5* 27.3*    HTN CAD hx of stent 09/2021 HLD: Troponin negative x 4. . EKG nonischemic.   Continue his Coreg, asirin, he is intolerant to statin.Continue diuretics as above. I reviewed patient's lab finding EKG with cardiology team- spoke w/ Hannen from cardio who will arrange outpatient follow-up next Friday His last cath as below CARDIAC CATH 09/2021 CATH>Severe single-vessel disease with 85% proximal LAD ->  Successful DES PCI using Synergy DES 2.5 mm x 12 mm postdilated/deployed to 2.8 mm.   TIMI-3 flow pre and post.  Reduced to 0%. Otherwise angiographically normal coronary arteries. Normal LVEF with normal EDP  Hypokalemia: replaced this morning: Continue potassium supplement at home advised potassium rich diet  Glaucoma: Continue eyedrops.   Pressure injury:  Continue wound care as below  Pressure injury stage II POA's dressing change as below     Pressure Injury 07/23/23 Buttocks Medial Stage 2 -  Partial thickness loss of dermis presenting as a shallow open injury with a red, pink wound bed without slough. (Active)  07/23/23 1512  Location: Buttocks  Location Orientation: Medial  Staging: Stage 2 -  Partial thickness loss of dermis presenting as a shallow open injury with a red, pink wound bed without slough.  Wound Description  (Comments):   Present on Admission: Yes  Dressing Type Foam - Lift dressing to assess site every shift     Consults: Oncology  Subjective: Aaox3 pain better. Doing well on room air.He is dressed up and requesting for discharge Discussed with his wife at bedside and grandson on phone- who is somewhat upset about CT scan findings being discussed w/ patient other day- I apologized for any misunderstanding. Of note CT scan findings were discussed by oncology team and myself at bedside on 2/24 with patient's wife at bedside and granddaughter on phone. I had unit manager Lillia Abed with me today   Discharge Exam: Vitals:   07/27/23 0424 07/27/23 0820  BP: (!) 143/68 (!) 152/71  Pulse: 65 64  Resp: 18   Temp: 97.9 F (36.6 C)   SpO2: 94%    General: Pt is alert, awake, not in acute distress Cardiovascular: RRR, S1/S2 +, no rubs, no gallops Respiratory: CTA bilaterally, no wheezing, no rhonchi Abdominal: Soft, NT, ND, bowel sounds + Extremities: no edema, no cyanosis  Discharge Instructions  Discharge Instructions     Discharge instructions   Complete by: As directed    Please call call MD or return to ER for similar or worsening recurring problem that brought you to hospital or if any fever,nausea/vomiting,abdominal pain, uncontrolled pain, chest pain,  shortness of breath or any other alarming symptoms.  Please follow-up Dr Bertis Ruddy on [facility]  Please avoid alcohol, smoking, or any other illicit substance and maintain healthy habits including taking your regular medications as prescribed.  You were cared for by a hospitalist during your hospital stay. If you have any questions about your discharge medications or the care you received while you were in the hospital after you are discharged, you can call the unit and ask to speak with the hospitalist on call if the hospitalist that took care of you is not available.  Once you are discharged, your primary care physician will handle  any further medical issues. Please note that NO REFILLS for any discharge medications will be authorized once you are discharged, as it is imperative that you return to your primary care physician (or establish a relationship with a primary care physician if you do not have one) for your aftercare needs so that they can reassess your need for medications and monitor your lab values   Increase activity slowly   Complete by: As directed    No wound care   Complete by: As directed       Allergies as of 07/27/2023       Reactions   Atorvastatin Other (See Comments)   Made the hands ACHE   Cymbalta [duloxetine Hcl] Nausea Only, Other (See Comments)   Drowsiness, also   Penicillins Hives        Medication List     TAKE these medications    acyclovir 400 MG tablet Commonly known as: ZOVIRAX Take 1 tablet (400 mg total) by mouth daily.   albuterol 108 (90 Base) MCG/ACT inhaler Commonly known as:  VENTOLIN HFA Inhale 1-2 puffs into the lungs every 6 (six) hours as needed for wheezing or shortness of breath.   allopurinol 300 MG tablet Commonly known as: ZYLOPRIM Take 300 mg by mouth daily.   aspirin EC 325 MG tablet Take 1 tablet (325 mg total) by mouth daily.   brimonidine 0.2 % ophthalmic solution Commonly known as: ALPHAGAN Place 1 drop into the left eye in the morning and at bedtime.   carvedilol 3.125 MG tablet Commonly known as: COREG TAKE 1 TABLET BY MOUTH TWICE DAILY   Centrum Silver 50+Men Tabs Take 1 tablet by mouth daily with breakfast.   cetirizine 10 MG tablet Commonly known as: ZYRTEC Take 10 mg by mouth every morning.   clopidogrel 75 MG tablet Commonly known as: PLAVIX Take 75 mg by mouth daily.   cyclobenzaprine 10 MG tablet Commonly known as: FLEXERIL Take 10 mg by mouth daily as needed for muscle spasms.   Denta 5000 Plus 1.1 % Crea dental cream Generic drug: sodium fluoride Place 1 Application onto teeth at bedtime.   dexamethasone 4 MG  tablet Commonly known as: DECADRON Take 4 pills once a week on Fridays. What changed:  how much to take how to take this when to take this additional instructions   docusate sodium 100 MG capsule Commonly known as: COLACE Take 100 mg by mouth 2 (two) times daily.   dorzolamide-timolol 2-0.5 % ophthalmic solution Commonly known as: COSOPT Place 1 drop into both eyes 2 (two) times daily.   furosemide 40 MG tablet Commonly known as: LASIX Take 40 mg in the morning and 20 mg in the afternoon   ibuprofen 600 MG tablet Commonly known as: ADVIL Take 1 tablet (600 mg total) by mouth every 6 (six) hours as needed for moderate pain (pain score 4-6).   ketoconazole 2 % cream Commonly known as: NIZORAL Apply 1 application  topically daily as needed for irritation.   latanoprost 0.005 % ophthalmic solution Commonly known as: XALATAN Place 1 drop into both eyes at bedtime.   lenalidomide 10 MG capsule Commonly known as: REVLIMID Take 1 capsule (10 mg total) by mouth daily. Take 1 capsule daily for 14 days on and 7 days off, repeat every 21 days. [employer] Auth #  [id number] Date Obtained 07/09/23   lidocaine 5 % ointment Commonly known as: XYLOCAINE Apply 1 Application topically 2 (two) times daily as needed. What changed:  when to take this reasons to take this   morphine 15 MG 12 hr tablet Commonly known as: MS CONTIN Take 15 mg by mouth daily as needed for pain.   nitroGLYCERIN 0.4 MG SL tablet Commonly known as: Nitrostat Place 1 tablet (0.4 mg total) under the tongue every 5 (five) minutes as needed. What changed: reasons to take this   ondansetron 8 MG tablet Commonly known as: Zofran Take 1 tablet (8 mg total) by mouth every 8 (eight) hours as needed for nausea or vomiting.   Oxycodone HCl 10 MG Tabs Take 1 tablet (10 mg total) by mouth every 4 (four) hours as needed. What changed: reasons to take this   pantoprazole 40 MG tablet Commonly known as: PROTONIX Take 40  mg by mouth daily before breakfast.   polyethylene glycol 17 g packet Commonly known as: MIRALAX / GLYCOLAX Take 17 g by mouth daily. What changed: when to take this   potassium chloride SA 20 MEQ tablet Commonly known as: Klor-Con M20 Take 1 tablet (20 mEq total) by mouth daily.  pregabalin 100 MG capsule Commonly known as: LYRICA TAKE 1 CAPSULE BY MOUTH IN THE MORNING, AND AT NOON, AND 2 CAPSULES AT NIGHT What changed:  how much to take how to take this when to take this additional instructions   prochlorperazine 10 MG tablet Commonly known as: COMPAZINE Take 1 tablet (10 mg total) by mouth every 6 (six) hours as needed for nausea or vomiting.   rosuvastatin 40 MG tablet Commonly known as: CRESTOR TAKE 1 TABLET BY MOUTH DAILY   senna-docusate 8.6-50 MG tablet Commonly known as: Senokot-S Take 1 tablet by mouth 2 (two) times daily.   spironolactone 25 MG tablet Commonly known as: Aldactone Take 1 tablet (25 mg total) by mouth in the morning.   triamcinolone 55 MCG/ACT Aero nasal inhaler Commonly known as: NASACORT Place 2 sprays into the nose daily. What changed: how much to take   TYLENOL 500 MG tablet Generic drug: acetaminophen Take 1,000 mg by mouth every 6 (six) hours as needed for mild pain (pain score 1-3) or headache.         Follow-up Information     [name], MD Follow up in 1 week(s).   Specialty: Family Medicine Contact information: [address] [place] [address] [phone]         [name], MD Follow up in 2 day(s).   Specialty: Hematology and Oncology Contact information: [address] [place] [address] [phone]                Allergies  Allergen Reactions   Atorvastatin Other (See Comments)    Made the hands ACHE   Cymbalta [Duloxetine Hcl] Nausea Only and Other (See Comments)    Drowsiness, also   Penicillins Hives    The results of significant diagnostics  from this hospitalization (including imaging, microbiology, ancillary and laboratory) are listed below for reference.    Microbiology: Recent Results (from the past 240 hours)  Resp panel by RT-PCR (RSV, Flu A&B, Covid) Anterior Nasal Swab     Status: None   Collection Time: 07/23/23 10:16 AM   Specimen: Anterior Nasal Swab  Result Value Ref Range Status   SARS Coronavirus 2 by RT PCR NEGATIVE NEGATIVE Final    Comment: (NOTE) SARS-CoV-2 target nucleic acids are NOT DETECTED.  The SARS-CoV-2 RNA is generally detectable in upper respiratory specimens during the acute phase of infection. The lowest concentration of SARS-CoV-2 viral copies this assay can detect is 138 copies/mL. A negative result does not preclude SARS-Cov-2 infection and should not be used as the sole basis for treatment or other patient management decisions. A negative result may occur with  improper specimen collection/handling, submission of specimen other than nasopharyngeal swab, presence of viral mutation(s) within the areas targeted by this assay, and inadequate number of viral copies(<138 copies/mL). A negative result must be combined with clinical observations, patient history, and epidemiological information. The expected result is Negative.  Fact Sheet for Patients:  BloggerCourse.com  Fact Sheet for Healthcare Providers:  SeriousBroker.it  This test is no t yet approved or cleared by the [place] FDA and  has been authorized for detection and/or diagnosis of SARS-CoV-2 by FDA under an Emergency Use Authorization (EUA). This EUA will remain  in effect (meaning this test can be used) for the duration of the COVID-19 declaration under Section 564(b)(1) of the Act, 21 U.S.C.section 360bbb-3(b)(1), unless the authorization is terminated  or revoked sooner.       Influenza A by  PCR NEGATIVE NEGATIVE Final   Influenza B by PCR NEGATIVE NEGATIVE Final     Comment: (NOTE) The Xpert Xpress SARS-CoV-2/FLU/RSV plus assay is intended as an aid in the diagnosis of influenza from Nasopharyngeal swab specimens and should not be used as a sole basis for treatment. Nasal washings and aspirates are unacceptable for Xpert Xpress SARS-CoV-2/FLU/RSV testing.  Fact Sheet for Patients: BloggerCourse.com  Fact Sheet for Healthcare Providers: SeriousBroker.it  This test is not yet approved or cleared by the [place] FDA and has been authorized for detection and/or diagnosis of SARS-CoV-2 by FDA under an Emergency Use Authorization (EUA). This EUA will remain in effect (meaning this test can be used) for the duration of the COVID-19 declaration under Section 564(b)(1) of the Act, 21 U.S.C. section 360bbb-3(b)(1), unless the authorization is terminated or revoked.     Resp Syncytial Virus by PCR NEGATIVE NEGATIVE Final    Comment: (NOTE) Fact Sheet for Patients: BloggerCourse.com  Fact Sheet for Healthcare Providers: SeriousBroker.it  This test is not yet approved or cleared by the [place] FDA and has been authorized for detection and/or diagnosis of SARS-CoV-2 by FDA under an Emergency Use Authorization (EUA). This EUA will remain in effect (meaning this test can be used) for the duration of the COVID-19 declaration under Section 564(b)(1) of the Act, 21 U.S.C. section 360bbb-3(b)(1), unless the authorization is terminated or revoked.  Performed at [facility], [address] [place], [place] [address]     Procedures/Studies: DG Tibia/Fibula Right Result Date: 07/26/2023 CLINICAL DATA:  Pain EXAM: RIGHT TIBIA AND FIBULA - 2 VIEW COMPARISON:  None Available. FINDINGS: Frontal and lateral views of the right tibia and fibula are obtained. There are no acute or destructive bony abnormalities. Alignment is anatomic.  Mild 3 compartmental osteoarthritis of the right knee, greatest in the lateral compartment. Soft tissues are unremarkable. IMPRESSION: 1. No acute bony abnormality. 2. Mild 3 compartmental right knee osteoarthritis. Electronically Signed   By: Sharlet Salina M.D.   On: 07/26/2023 15:42   CT Angio Chest Pulmonary Embolism (PE) W or WO Contrast Result Date: 07/26/2023 CLINICAL DATA:  Evaluate for acute pulmonary embolism. High probability. Chest pain with shortness of breath and cough. History of multiple myeloma. EXAM: CT ANGIOGRAPHY CHEST WITH CONTRAST TECHNIQUE: Multidetector CT imaging of the chest was performed using the standard protocol during bolus administration of intravenous contrast. Multiplanar CT image reconstructions and MIPs were obtained to evaluate the vascular anatomy. RADIATION DOSE REDUCTION: This exam was performed according to the departmental dose-optimization program which includes automated exposure control, adjustment of the mA and/or kV according to patient size and/or use of iterative reconstruction technique. CONTRAST:  80mL OMNIPAQUE IOHEXOL 350 MG/ML SOLN COMPARISON:  05/03/2023 FINDINGS: Cardiovascular: There is satisfactory opacification of the pulmonary arteries. However, there is excessive motion artifact which diminishes exam detail beyond the level of the lobar pulmonary arteries. Within these limitations, no central obstructing pulmonary embolus identified. Aortic atherosclerosis. Coronary artery calcifications. Mediastinum/Nodes: Thyroid gland, trachea, and esophagus are unremarkable. No enlarged mediastinal, hilar, or axillary adenopathy. Lungs/Pleura: Motion artifact diminishes exam detail. Subsegmental atelectasis is identified within both lower lobes with associated volume loss. No significant pleural effusion or airspace consolidation. No pneumothorax identified. Upper Abdomen: No acute abnormality. Musculoskeletal: There is extensive lucent bone lesions identified  throughout the bony thorax compatible with history of multiple myeloma. Compared with the previous exam there is been marked progression of disease with increase multiplicity and size of previously noted lucent bone  lesions. As noted previously there are numerous bilateral rib fractures of varying ages many of which are likely pathologic in nature given the extent multiple myeloma. Compared with the previous exam some of these fractures are new for example, there is a new fracture involving the posterolateral aspect of the left tenth rib, image 120/6. Review of the MIP images confirms the above findings. IMPRESSION: 1. Exam is limited by excessive motion artifact which diminishes exam detail beyond the level of the lobar pulmonary arteries. Within these limitations, no central obstructing pulmonary embolus identified. 2. Extensive lucent bone lesions identified throughout the bony thorax compatible with history of multiple myeloma. Compared with the previous exam there has been marked progression of disease with increase multiplicity and size of previously noted lucent bone lesions. 3. Numerous bilateral rib fractures of varying ages, some of which are likely pathologic in nature given the extent multiple myeloma. Compared with the previous exam some of these fractures are new none. 4. Coronary artery calcifications. 5.  Aortic Atherosclerosis (ICD10-I70.0). Electronically Signed   By: Signa Kell M.D.   On: 07/26/2023 10:37   DG Chest Port 1 View Result Date: 07/25/2023 CLINICAL DATA:  Chest pain with shortness of breath and cough. EXAM: PORTABLE CHEST 1 VIEW COMPARISON:  07/23/2023 FINDINGS: The cardio pericardial silhouette is enlarged. Similar bibasilar atelectasis or infiltrate with probable small bilateral pleural effusions. No overt pulmonary edema. Old left-sided rib fractures noted. Telemetry leads overlie the chest. IMPRESSION: Similar bibasilar atelectasis or infiltrate with probable small bilateral  pleural effusions. Electronically Signed   By: Kennith Center M.D.   On: 07/25/2023 09:36   DG Chest Port 1 View Result Date: 07/23/2023 CLINICAL DATA:  Shortness of breath and hypoxia EXAM: PORTABLE CHEST 1 VIEW COMPARISON:  Chest radiograph dated 07/19/2023 FINDINGS: Normal lung volumes. Mild bilateral interstitial opacities and bibasilar patchy and linear opacities. Blunting of the bilateral costophrenic angles. No pneumothorax. Similar enlarged cardiomediastinal silhouette. Old bilateral rib fractures. IMPRESSION: 1. Mild bilateral interstitial opacities and bibasilar patchy and linear opacities, which may represent pulmonary edema or atypical infection. 2. Blunting of the bilateral costophrenic angles, which may represent small pleural effusions. 3. Similar cardiomegaly. Electronically Signed   By: Agustin Cree M.D.   On: 07/23/2023 10:45   ECHOCARDIOGRAM LIMITED Result Date: 07/20/2023    ECHOCARDIOGRAM LIMITED REPORT   Patient Name:   TREVONN HALLUM Date of Exam: 07/20/2023 Medical Rec #:  [id number]        Height:       72.0 in Accession #:    [id number]       Weight:       232.0 lb Date of Birth:  1946-04-12       BSA:          2.269 m Patient Age:    77 years         BP:           160/68 mmHg Patient Gender: M                HR:           78 bpm. Exam Location:  [address] Procedure: Limited Echo, Cardiac Doppler, Limited Color Doppler and 3D Echo Indications:    I50.9 CHF  History:        Patient has prior history of Echocardiogram examinations, most                 recent 11/05/2020. CAD, Signs/Symptoms:Dyspnea and Fatigue; Risk  Factors:HLD.  Sonographer:    Clearence Ped RCS Referring Phys: [id number] Bettey Mare LAWRENCE IMPRESSIONS  1. Limited study for LV function; not all views obtained; MR not well assessed.  2. Left ventricular ejection fraction, by estimation, is 60 to 65%. The left ventricle has normal function. The left ventricle has no regional wall motion abnormalities. Left  ventricular diastolic parameters were normal.  3. Right ventricular systolic function is normal. The right ventricular size is normal.  4. The mitral valve is normal in structure. Mild to moderate mitral valve regurgitation. No evidence of mitral stenosis.  5. The aortic valve is tricuspid. Aortic valve regurgitation is not visualized. Aortic valve sclerosis is present, with no evidence of aortic valve stenosis.  6. Aortic dilatation noted. There is mild dilatation of the ascending aorta, measuring 43 mm. FINDINGS  Left Ventricle: Left ventricular ejection fraction, by estimation, is 60 to 65%. The left ventricle has normal function. The left ventricle has no regional wall motion abnormalities. The left ventricular internal cavity size was normal in size. There is  no left ventricular hypertrophy. Left ventricular diastolic parameters were normal. Right Ventricle: The right ventricular size is normal. Right ventricular systolic function is normal. Left Atrium: Left atrial size was normal in size. Right Atrium: Right atrial size was normal in size. Pericardium: There is no evidence of pericardial effusion. Mitral Valve: The mitral valve is normal in structure. Mild to moderate mitral valve regurgitation. No evidence of mitral valve stenosis. Tricuspid Valve: The tricuspid valve is normal in structure. Tricuspid valve regurgitation is mild . No evidence of tricuspid stenosis. Aortic Valve: The aortic valve is tricuspid. Aortic valve regurgitation is not visualized. Aortic valve sclerosis is present, with no evidence of aortic valve stenosis. Pulmonic Valve: The pulmonic valve was normal in structure. Aorta: Aortic dilatation noted. There is mild dilatation of the ascending aorta, measuring 43 mm. Additional Comments: Limited study for LV function; not all views obtained; MR not well assessed.  LEFT VENTRICLE PLAX 2D LVIDd:         4.30 cm   Diastology LVIDs:         3.00 cm   LV e' medial:    9.90 cm/s LV PW:          1.20 cm   LV E/e' medial:  10.3 LV IVS:        1.00 cm   LV e' lateral:   14.30 cm/s LVOT diam:     2.10 cm   LV E/e' lateral: 7.1 LVOT Area:     3.46 cm                           3D Volume EF:                          3D EF:        61 %                          LV EDV:       126 ml                          LV ESV:       49 ml                          LV  SV:        77 ml RIGHT VENTRICLE RVSP:           35.0 mmHg LEFT ATRIUM         Index       RIGHT ATRIUM LA diam:    4.00 cm 1.76 cm/m  RA Pressure: 3.00 mmHg   AORTA Ao Root diam: 3.40 cm Ao Asc diam:  4.30 cm MITRAL VALVE                TRICUSPID VALVE MV Area (PHT):              TR Peak grad:   32.0 mmHg MV Decel Time:              TR Vmax:        283.00 cm/s MV E velocity: 102.00 cm/s  Estimated RAP:  3.00 mmHg MV A velocity: 88.60 cm/s   RVSP:           35.0 mmHg MV E/A ratio:  1.15                             SHUNTS                             Systemic Diam: 2.10 cm Olga Millers MD Electronically signed by Olga Millers MD Signature Date/Time: 07/20/2023/1:05:58 PM    Final    DG Chest 2 View Result Date: 07/19/2023 CLINICAL DATA:  Shortness of breath with peripheral edema EXAM: CHEST - 2 VIEW COMPARISON:  05/24/2023, PET CT 04/22/2023 FINDINGS: Cardiomegaly with small bilateral effusions, similar on the left slightly increased on the right. Mild sclerosis at left sixth rib, likely due to previously noted fracture. Possible acute left eighth posterolateral rib fracture compared to prior. Numerous additional remote fractures are noted. IMPRESSION: 1. Cardiomegaly with small bilateral effusions, similar on the left slightly increased on the right. 2. Possible acute left eighth posterolateral rib fracture compared to prior. Multiple remote bilateral rib fractures Electronically Signed   By: Jasmine Pang M.D.   On: 07/19/2023 17:39    Labs: BNP (last 3 results) Recent Labs    07/19/23 1226 07/23/23 1016 07/25/23 1133  BNP 160.9* 116.3* 210.9*    Basic Metabolic Panel: Recent Labs  Lab 07/23/23 0820 07/23/23 1531 07/24/23 0427 07/25/23 1133 07/27/23 0345  NA 138  --  140 142 142  K 4.3  --  4.3 4.1 3.2*  CL 104  --  104 107 108  CO2 29  --  25 28 23   GLUCOSE 94  --  143* 116* 82  BUN 24*  --  31* 31* 38*  CREATININE 0.84  --  0.82 0.92 0.95  CALCIUM 9.5  --  9.5 9.5 8.9  MG  --  2.0  --   --   --   PHOS  --  3.1  --   --   --    Liver Function Tests: Recent Labs  Lab 07/23/23 0820 07/24/23 0427 07/25/23 1133 07/27/23 0345  AST 12* 16 20 14*  ALT 10 15 19 16   ALKPHOS 147* 126 126 119  BILITOT 0.2 0.2 0.4 0.5  PROT 7.4 7.8 7.8 7.6  ALBUMIN 3.2* 2.8* 2.8* 2.8*  No results for input(s): "LIPASE", "AMYLASE" in the last 168 hours. No results for input(s): "AMMONIA" in the last 168 hours. CBC: Recent Labs  Lab  07/23/23 0820 07/24/23 0427 07/25/23 1133 07/27/23 0345  WBC 7.2 6.7 9.4 7.5  NEUTROABS 4.2  --   --  4.4  HGB 8.6* 8.3* 8.4* 8.8*  HCT 28.2* 27.4* 28.5* 27.3*  MCV 89.5 90.7 92.8 88.6  PLT 391 391 368 334   Recent Labs    07/25/23 0934  DDIMER 2.56*   Recent Labs    07/26/23 1733  TIBC 349  IRON 39*   Urinalysis    Component Value Date/Time   COLORURINE STRAW (A) 05/03/2023 1823   APPEARANCEUR CLEAR 05/03/2023 1823   LABSPEC 1.011 05/03/2023 1823   PHURINE 5.0 05/03/2023 1823   GLUCOSEU NEGATIVE 05/03/2023 1823   HGBUR SMALL (A) 05/03/2023 1823   BILIRUBINUR NEGATIVE 05/03/2023 1823   KETONESUR NEGATIVE 05/03/2023 1823   PROTEINUR NEGATIVE 05/03/2023 1823   UROBILINOGEN 1.0 04/15/2013 2256   NITRITE NEGATIVE 05/03/2023 1823   LEUKOCYTESUR NEGATIVE 05/03/2023 1823   Sepsis Labs Recent Labs  Lab 07/23/23 0820 07/24/23 0427 07/25/23 1133 07/27/23 0345  WBC 7.2 6.7 9.4 7.5  Microbiology Recent Results (from the past 240 hours)  Resp panel by RT-PCR (RSV, Flu A&B, Covid) Anterior Nasal Swab     Status: None   Collection Time: 07/23/23 10:16 AM   Specimen: Anterior Nasal  Swab  Result Value Ref Range Status   SARS Coronavirus 2 by RT PCR NEGATIVE NEGATIVE Final    Comment: (NOTE) SARS-CoV-2 target nucleic acids are NOT DETECTED.  The SARS-CoV-2 RNA is generally detectable in upper respiratory specimens during the acute phase of infection. The lowest concentration of SARS-CoV-2 viral copies this assay can detect is 138 copies/mL. A negative result does not preclude SARS-Cov-2 infection and should not be used as the sole basis for treatment or other patient management decisions. A negative result may occur with  improper specimen collection/handling, submission of specimen other than nasopharyngeal swab, presence of viral mutation(s) within the areas targeted by this assay, and inadequate number of viral copies(<138 copies/mL). A negative result must be combined with clinical observations, patient history, and epidemiological information. The expected result is Negative.  Fact Sheet for Patients:  BloggerCourse.com  Fact Sheet for Healthcare Providers:  SeriousBroker.it  This test is no t yet approved or cleared by the [place] FDA and  has been authorized for detection and/or diagnosis of SARS-CoV-2 by FDA under an Emergency Use Authorization (EUA). This EUA will remain  in effect (meaning this test can be used) for the duration of the COVID-19 declaration under Section 564(b)(1) of the Act, 21 U.S.C.section 360bbb-3(b)(1), unless the authorization is terminated  or revoked sooner.       Influenza A by PCR NEGATIVE NEGATIVE Final   Influenza B by PCR NEGATIVE NEGATIVE Final    Comment: (NOTE) The Xpert Xpress SARS-CoV-2/FLU/RSV plus assay is intended as an aid in the diagnosis of influenza from Nasopharyngeal swab specimens and should not be used as a sole basis for treatment. Nasal washings and aspirates are unacceptable for Xpert Xpress SARS-CoV-2/FLU/RSV testing.  Fact Sheet for  Patients: BloggerCourse.com  Fact Sheet for Healthcare Providers: SeriousBroker.it  This test is not yet approved or cleared by the [place] FDA and has been authorized for detection and/or diagnosis of SARS-CoV-2 by FDA under an Emergency Use Authorization (EUA). This EUA will remain in effect (meaning this test can be used) for the duration of the COVID-19 declaration under Section 564(b)(1) of the Act, 21 U.S.C. section 360bbb-3(b)(1), unless the authorization is terminated or revoked.  Resp Syncytial Virus by PCR NEGATIVE NEGATIVE Final    Comment: (NOTE) Fact Sheet for Patients: BloggerCourse.com  Fact Sheet for Healthcare Providers: SeriousBroker.it  This test is not yet approved or cleared by the [place] FDA and has been authorized for detection and/or diagnosis of SARS-CoV-2 by FDA under an Emergency Use Authorization (EUA). This EUA will remain in effect (meaning this test can be used) for the duration of the COVID-19 declaration under Section 564(b)(1) of the Act, 21 U.S.C. section 360bbb-3(b)(1), unless the authorization is terminated or revoked.  Performed at [facility], [address], [place], [place] [address]    Time coordinating discharge: 25 minutes SIGNED: Lanae Boast, MD  [facility] 07/27/2023, 1:29 PM  If 7PM-7AM, please contact night-coverage [email or web address]

## 2023-07-26 NOTE — Plan of Care (Signed)
  Problem: Education: Goal: Knowledge of General Education information will improve Description: Including pain rating scale, medication(s)/side effects and non-pharmacologic comfort measures Outcome: Progressing   Problem: Health Behavior/Discharge Planning: Goal: Ability to manage health-related needs will improve Outcome: Progressing   Problem: Clinical Measurements: Goal: Ability to maintain clinical measurements within normal limits will improve Outcome: Progressing Goal: Will remain free from infection Outcome: Progressing Goal: Respiratory complications will improve Outcome: Progressing Goal: Cardiovascular complication will be avoided Outcome: Progressing   Problem: Activity: Goal: Risk for activity intolerance will decrease Outcome: Progressing   Problem: Nutrition: Goal: Adequate nutrition will be maintained Outcome: Progressing   Problem: Coping: Goal: Level of anxiety will decrease Outcome: Progressing   Problem: Elimination: Goal: Will not experience complications related to bowel motility Outcome: Progressing Goal: Will not experience complications related to urinary retention Outcome: Progressing   Problem: Pain Managment: Goal: General experience of comfort will improve and/or be controlled Outcome: Progressing   Problem: Safety: Goal: Ability to remain free from injury will improve Outcome: Progressing   Problem: Skin Integrity: Goal: Risk for impaired skin integrity will decrease Outcome: Progressing

## 2023-07-26 NOTE — Progress Notes (Signed)
 PT Cancellation Note  Patient Details Name: Daniel Whitaker MRN: [id number] DOB: 01-07-1946   Cancelled Treatment:    Reason Eval/Treat Not Completed:  Nurse in with patient and family. Will check back as schedule allows.    Faye Ramsay, PT Acute Rehabilitation  Office: [phone]

## 2023-07-27 ENCOUNTER — Telehealth: Payer: Self-pay | Admitting: *Deleted

## 2023-07-27 DIAGNOSIS — Z955 Presence of coronary angioplasty implant and graft: Secondary | ICD-10-CM | POA: Diagnosis not present

## 2023-07-27 DIAGNOSIS — Z1152 Encounter for screening for COVID-19: Secondary | ICD-10-CM | POA: Diagnosis not present

## 2023-07-27 DIAGNOSIS — I2510 Atherosclerotic heart disease of native coronary artery without angina pectoris: Secondary | ICD-10-CM | POA: Diagnosis not present

## 2023-07-27 DIAGNOSIS — N19 Unspecified kidney failure: Secondary | ICD-10-CM | POA: Diagnosis not present

## 2023-07-27 DIAGNOSIS — E785 Hyperlipidemia, unspecified: Secondary | ICD-10-CM | POA: Diagnosis not present

## 2023-07-27 DIAGNOSIS — H409 Unspecified glaucoma: Secondary | ICD-10-CM | POA: Diagnosis not present

## 2023-07-27 DIAGNOSIS — Z79899 Other long term (current) drug therapy: Secondary | ICD-10-CM | POA: Diagnosis not present

## 2023-07-27 DIAGNOSIS — Z87891 Personal history of nicotine dependence: Secondary | ICD-10-CM | POA: Diagnosis not present

## 2023-07-27 DIAGNOSIS — E8770 Fluid overload, unspecified: Secondary | ICD-10-CM | POA: Diagnosis not present

## 2023-07-27 DIAGNOSIS — R079 Chest pain, unspecified: Secondary | ICD-10-CM | POA: Diagnosis not present

## 2023-07-27 DIAGNOSIS — J9601 Acute respiratory failure with hypoxia: Secondary | ICD-10-CM | POA: Diagnosis not present

## 2023-07-27 DIAGNOSIS — D638 Anemia in other chronic diseases classified elsewhere: Secondary | ICD-10-CM | POA: Diagnosis not present

## 2023-07-27 DIAGNOSIS — E876 Hypokalemia: Secondary | ICD-10-CM | POA: Diagnosis not present

## 2023-07-27 DIAGNOSIS — J069 Acute upper respiratory infection, unspecified: Secondary | ICD-10-CM | POA: Diagnosis not present

## 2023-07-27 DIAGNOSIS — L89302 Pressure ulcer of unspecified buttock, stage 2: Secondary | ICD-10-CM | POA: Diagnosis not present

## 2023-07-27 DIAGNOSIS — Z7982 Long term (current) use of aspirin: Secondary | ICD-10-CM | POA: Diagnosis not present

## 2023-07-27 DIAGNOSIS — C9000 Multiple myeloma not having achieved remission: Secondary | ICD-10-CM | POA: Diagnosis not present

## 2023-07-27 DIAGNOSIS — J45909 Unspecified asthma, uncomplicated: Secondary | ICD-10-CM | POA: Diagnosis not present

## 2023-07-27 LAB — COMPREHENSIVE METABOLIC PANEL
ALT: 16 U/L (ref 0–44)
AST: 14 U/L — ABNORMAL LOW (ref 15–41)
Albumin: 2.8 g/dL — ABNORMAL LOW (ref 3.5–5.0)
Alkaline Phosphatase: 119 U/L (ref 38–126)
Anion gap: 11 (ref 5–15)
BUN: 38 mg/dL — ABNORMAL HIGH (ref 8–23)
CO2: 23 mmol/L (ref 22–32)
Calcium: 8.9 mg/dL (ref 8.9–10.3)
Chloride: 108 mmol/L (ref 98–111)
Creatinine, Ser: 0.95 mg/dL (ref 0.61–1.24)
GFR, Estimated: >60 mL/min (ref >60)
Glucose, Bld: 82 mg/dL (ref 70–99)
Potassium: 3.2 mmol/L — ABNORMAL LOW (ref 3.5–5.1)
Sodium: 142 mmol/L (ref 135–145)
Total Bilirubin: 0.5 mg/dL (ref 0.0–1.2)
Total Protein: 7.6 g/dL (ref 6.5–8.1)

## 2023-07-27 LAB — CBC WITH DIFFERENTIAL/PLATELET
Abs Immature Granulocytes: 0.07 10*3/uL (ref 0.00–0.07)
Basophils Absolute: 0.1 10*3/uL (ref 0.0–0.1)
Basophils Relative: 1 %
Eosinophils Absolute: 0 10*3/uL (ref 0.0–0.5)
Eosinophils Relative: 0 %
HCT: 27.3 % — ABNORMAL LOW (ref 39.0–52.0)
Hemoglobin: 8.8 g/dL — ABNORMAL LOW (ref 13.0–17.0)
Immature Granulocytes: 1 %
Lymphocytes Relative: 31 %
Lymphs Abs: 2.3 10*3/uL (ref 0.7–4.0)
MCH: 28.6 pg (ref 26.0–34.0)
MCHC: 32.2 g/dL (ref 30.0–36.0)
MCV: 88.6 fL (ref 80.0–100.0)
Monocytes Absolute: 0.7 10*3/uL (ref 0.1–1.0)
Monocytes Relative: 9 %
Neutro Abs: 4.4 10*3/uL (ref 1.7–7.7)
Neutrophils Relative %: 58 %
Platelets: 334 10*3/uL (ref 150–400)
RBC: 3.08 MIL/uL — ABNORMAL LOW (ref 4.22–5.81)
RDW: 17.3 % — ABNORMAL HIGH (ref 11.5–15.5)
WBC: 7.5 10*3/uL (ref 4.0–10.5)
nRBC: 0 % (ref 0.0–0.2)

## 2023-07-27 LAB — TYPE AND SCREEN
ABO/RH(D): O POS
Antibody Screen: POSITIVE
Unit division: 0

## 2023-07-27 LAB — BPAM RBC
Blood Product Expiration Date: 202503242359
ISSUE DATE / TIME: 202502242142
Unit Type and Rh: 5100

## 2023-07-27 LAB — LACTATE DEHYDROGENASE: LDH: 130 U/L (ref 98–192)

## 2023-07-27 LAB — ERYTHROPOIETIN: Erythropoietin: 32.3 m[IU]/mL — ABNORMAL HIGH (ref 2.6–18.5)

## 2023-07-27 MED ORDER — POTASSIUM CHLORIDE CRYS ER 20 MEQ PO TBCR
40.0000 meq | EXTENDED_RELEASE_TABLET | Freq: Once | ORAL | Status: AC
  Administered 2023-07-27: 40 meq via ORAL
  Filled 2023-07-27: qty 2

## 2023-07-27 NOTE — Progress Notes (Signed)
 PT Cancellation Note  Patient Details Name: Daniel Whitaker MRN: [id number] DOB: Aug 22, 1945   Cancelled Treatment:    Reason Eval/Treat Not Completed: PT screened, no needs identified, will sign off (Pt stated he does not need PT, he's been able to walk with his rollator here in the hospital, he stated he has a pulse oximeter and that his SpO2 was 97% on room air with mobility this morning.)   Tamala Ser PT 07/27/2023  Acute Rehabilitation Services  Office [phone]

## 2023-07-27 NOTE — Telephone Encounter (Signed)
 Received a call from pt grandson stating that he doesn't agree with Dr.Enerver wanting to give orders for Zometa administration. He declined for him to get it yesterday and he said he's declining it today. He states that he gets that his calcium is at risk as well as pt possibly obtaining a fracture but also he has a lot of dental problems as well.    He states that he's going to be discharged today and wants to know if he needs to come in sooner than Friday as scheduled  Lucila Maine stated that he would rather Dr. Bertis Ruddy "drive this bus" than have Dr. Cherie Ouch make the decision.   Advised thatDr.Gorsuch is out of office but I would make her aware of his concerns and if there were changes as in her wanting to see him sooner then the office would contact him. Pt grandson verbalized understanding

## 2023-07-27 NOTE — Progress Notes (Signed)
 Mr. Winnifred Friar is feeling okay this morning.  I am still not sure if he got his Zometa yesterday.  I do think he got the blood transfusion.  His labs this morning show white count 7.5.  Hemoglobin 8.8.  Platelet count 334,000.  His sodium 142.  Potassium 3.2.  BUN 38 creatinine 0.95.  Calcium 8.9 with an albumin of 2.8.  LDH is 130.  I would think that he should be able to go home today.  He will see Dr. Bertis Ruddy on Friday.  I really hope that he did get his Zometa.  I really think this is going to help with his bones.  I am glad that he got a unit of blood.  I am surprised his hemoglobin is not up a little bit higher.  It is possible that the hemoglobin may have been a little bit lower yesterday.  He has had no cough or shortness of breath.  He has had no fever.  His vital signs are temperature of 97.9.  Pulse 65.  Blood pressure 143/68.  His lungs sound clear bilaterally.  He has good air movement bilaterally.  Cardiac exam regular rate and rhythm.  He has no murmurs.  Abdomen soft.  He has good bowel sounds.  There is no fluid wave.  There is no guarding or rebound tenderness.  There is no hepatosplenomegaly.  Extremity shows no edema in the lower legs.  Hopefully, Mr. Joiner will do okay with further treatment of the myeloma.  Hopefully, his bones will heal up a little bit better.  I know that Dr. Bertis Ruddy will do an incredible job with him and will eventually get him to stem cell transplant.  It was very nice to meet him.  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   Christin Bach, MD  Jeri Modena 29:11

## 2023-07-28 ENCOUNTER — Other Ambulatory Visit: Payer: Self-pay | Admitting: Hematology and Oncology

## 2023-07-28 ENCOUNTER — Encounter: Payer: Self-pay | Admitting: Hematology and Oncology

## 2023-07-28 DIAGNOSIS — M79604 Pain in right leg: Secondary | ICD-10-CM | POA: Diagnosis not present

## 2023-07-28 MED ORDER — OXYCODONE HCL 10 MG PO TABS: 10.0000 mg | ORAL_TABLET | ORAL | 0 refills | Status: DC | PRN

## 2023-07-29 ENCOUNTER — Other Ambulatory Visit: Payer: Self-pay

## 2023-07-29 DIAGNOSIS — C9000 Multiple myeloma not having achieved remission: Secondary | ICD-10-CM

## 2023-07-29 MED ORDER — LENALIDOMIDE 10 MG PO CAPS
10.0000 mg | ORAL_CAPSULE | Freq: Every day | ORAL | 0 refills | Status: DC
Start: 2023-07-29 — End: 2023-07-29

## 2023-07-29 MED ORDER — LENALIDOMIDE 10 MG PO CAPS
10.0000 mg | ORAL_CAPSULE | Freq: Every day | ORAL | 0 refills | Status: DC
Start: 2023-07-29 — End: 2023-08-25

## 2023-07-30 ENCOUNTER — Encounter: Payer: Self-pay | Admitting: Hematology and Oncology

## 2023-07-30 ENCOUNTER — Inpatient Hospital Stay: Payer: Medicare Other

## 2023-07-30 ENCOUNTER — Inpatient Hospital Stay (HOSPITAL_BASED_OUTPATIENT_CLINIC_OR_DEPARTMENT_OTHER): Payer: Medicare Other | Admitting: Hematology and Oncology

## 2023-07-30 ENCOUNTER — Other Ambulatory Visit (HOSPITAL_COMMUNITY): Payer: Self-pay

## 2023-07-30 VITALS — BP 127/50 | HR 87 | Resp 18 | Ht 72.0 in | Wt 220.8 lb

## 2023-07-30 DIAGNOSIS — C9000 Multiple myeloma not having achieved remission: Secondary | ICD-10-CM | POA: Diagnosis not present

## 2023-07-30 DIAGNOSIS — E441 Mild protein-calorie malnutrition: Secondary | ICD-10-CM | POA: Diagnosis not present

## 2023-07-30 DIAGNOSIS — D539 Nutritional anemia, unspecified: Secondary | ICD-10-CM

## 2023-07-30 DIAGNOSIS — G893 Neoplasm related pain (acute) (chronic): Secondary | ICD-10-CM

## 2023-07-30 DIAGNOSIS — Z79899 Other long term (current) drug therapy: Secondary | ICD-10-CM | POA: Diagnosis not present

## 2023-07-30 DIAGNOSIS — E8770 Fluid overload, unspecified: Secondary | ICD-10-CM | POA: Diagnosis not present

## 2023-07-30 DIAGNOSIS — Z5112 Encounter for antineoplastic immunotherapy: Secondary | ICD-10-CM | POA: Diagnosis not present

## 2023-07-30 DIAGNOSIS — R0600 Dyspnea, unspecified: Secondary | ICD-10-CM | POA: Diagnosis not present

## 2023-07-30 LAB — CBC WITH DIFFERENTIAL (CANCER CENTER ONLY)
Abs Immature Granulocytes: 0.03 10*3/uL (ref 0.00–0.07)
Basophils Absolute: 0.1 10*3/uL (ref 0.0–0.1)
Basophils Relative: 2 %
Eosinophils Absolute: 0.4 10*3/uL (ref 0.0–0.5)
Eosinophils Relative: 4 %
HCT: 34.6 % — ABNORMAL LOW (ref 39.0–52.0)
Hemoglobin: 10.5 g/dL — ABNORMAL LOW (ref 13.0–17.0)
Immature Granulocytes: 0 %
Lymphocytes Relative: 25 %
Lymphs Abs: 2.1 10*3/uL (ref 0.7–4.0)
MCH: 27.2 pg (ref 26.0–34.0)
MCHC: 30.3 g/dL (ref 30.0–36.0)
MCV: 89.6 fL (ref 80.0–100.0)
Monocytes Absolute: 0.6 10*3/uL (ref 0.1–1.0)
Monocytes Relative: 7 %
Neutro Abs: 5.3 10*3/uL (ref 1.7–7.7)
Neutrophils Relative %: 62 %
Platelet Count: 366 10*3/uL (ref 150–400)
RBC: 3.86 MIL/uL — ABNORMAL LOW (ref 4.22–5.81)
RDW: 17.5 % — ABNORMAL HIGH (ref 11.5–15.5)
WBC Count: 8.5 10*3/uL (ref 4.0–10.5)
nRBC: 0 % (ref 0.0–0.2)

## 2023-07-30 LAB — CMP (CANCER CENTER ONLY)
ALT: 11 U/L (ref 0–44)
AST: 13 U/L — ABNORMAL LOW (ref 15–41)
Albumin: 3.2 g/dL — ABNORMAL LOW (ref 3.5–5.0)
Alkaline Phosphatase: 132 U/L — ABNORMAL HIGH (ref 38–126)
Anion gap: 6 (ref 5–15)
BUN: 24 mg/dL — ABNORMAL HIGH (ref 8–23)
CO2: 27 mmol/L (ref 22–32)
Calcium: 10.3 mg/dL (ref 8.9–10.3)
Chloride: 105 mmol/L (ref 98–111)
Creatinine: 0.87 mg/dL (ref 0.61–1.24)
GFR, Estimated: >60 mL/min (ref >60)
Glucose, Bld: 106 mg/dL — ABNORMAL HIGH (ref 70–99)
Potassium: 4.5 mmol/L (ref 3.5–5.1)
Sodium: 138 mmol/L (ref 135–145)
Total Bilirubin: 0.3 mg/dL (ref 0.0–1.2)
Total Protein: 8.5 g/dL — ABNORMAL HIGH (ref 6.5–8.1)

## 2023-07-30 MED ORDER — SENNOSIDES-DOCUSATE SODIUM 8.6-50 MG PO TABS: 2.0000 | ORAL_TABLET | Freq: Two times a day (BID) | ORAL | Status: DC

## 2023-07-30 MED ORDER — BORTEZOMIB CHEMO SQ INJECTION 3.5 MG (2.5MG/ML)
1.0400 mg/m2 | Freq: Once | INTRAMUSCULAR | Status: AC
  Administered 2023-07-30: 2.5 mg via SUBCUTANEOUS
  Filled 2023-07-30: qty 1

## 2023-07-30 MED ORDER — DARATUMUMAB-HYALURONIDASE-FIHJ 1800-30000 MG-UT/15ML ~~LOC~~ SOLN
1800.0000 mg | Freq: Once | SUBCUTANEOUS | Status: AC
  Administered 2023-07-30: 1800 mg via SUBCUTANEOUS
  Filled 2023-07-30: qty 15

## 2023-07-30 MED ORDER — ACETAMINOPHEN 325 MG PO TABS
650.0000 mg | ORAL_TABLET | Freq: Once | ORAL | Status: AC
  Administered 2023-07-30: 650 mg via ORAL
  Filled 2023-07-30: qty 2

## 2023-07-30 MED ORDER — DIPHENHYDRAMINE HCL 25 MG PO CAPS
25.0000 mg | ORAL_CAPSULE | Freq: Once | ORAL | Status: AC
  Administered 2023-07-30: 25 mg via ORAL
  Filled 2023-07-30: qty 1

## 2023-07-30 MED ORDER — POLYETHYLENE GLYCOL 3350 17 G PO PACK: 17.0000 g | PACK | Freq: Two times a day (BID) | ORAL | Status: DC

## 2023-07-30 MED ORDER — MORPHINE SULFATE ER 30 MG PO TBCR
30.0000 mg | EXTENDED_RELEASE_TABLET | Freq: Two times a day (BID) | ORAL | 0 refills | Status: DC
Start: 2023-07-30 — End: 2023-08-04
  Filled 2023-07-30: qty 60, 30d supply, fill #0

## 2023-07-30 NOTE — Assessment & Plan Note (Addendum)
 Multifactorial This has improved since we put Revlimid on hold

## 2023-07-30 NOTE — Patient Instructions (Signed)
 [facility] - A DEPT OF [facility]  Discharge Instructions: Thank you for choosing [facility] to provide your oncology and hematology care.   If you have a lab appointment with the Cancer Center, please go directly to the Cancer Center and check in at the registration area.   Wear comfortable clothing and clothing appropriate for easy access to any Portacath or PICC line.   We strive to give you quality time with your provider. You may need to reschedule your appointment if you arrive late (15 or more minutes).  Arriving late affects you and other patients whose appointments are after yours.  Also, if you miss three or more appointments without notifying the office, you may be dismissed from the clinic at the provider's discretion.      For prescription refill requests, have your pharmacy contact our office and allow 72 hours for refills to be completed.    Today you received the following chemotherapy and/or immunotherapy agents darzalex faspro, velcade      To help prevent nausea and vomiting after your treatment, we encourage you to take your nausea medication as directed.  BELOW ARE SYMPTOMS THAT SHOULD BE REPORTED IMMEDIATELY: *FEVER GREATER THAN 100.4 F (38 C) OR HIGHER *CHILLS OR SWEATING *NAUSEA AND VOMITING THAT IS NOT CONTROLLED WITH YOUR NAUSEA MEDICATION *UNUSUAL SHORTNESS OF BREATH *UNUSUAL BRUISING OR BLEEDING *URINARY PROBLEMS (pain or burning when urinating, or frequent urination) *BOWEL PROBLEMS (unusual diarrhea, constipation, pain near the anus) TENDERNESS IN MOUTH AND THROAT WITH OR WITHOUT PRESENCE OF ULCERS (sore throat, sores in mouth, or a toothache) UNUSUAL RASH, SWELLING OR PAIN  UNUSUAL VAGINAL DISCHARGE OR ITCHING   Items with * indicate a potential emergency and should be followed up as soon as possible or go to the Emergency Department if any problems should occur.  Please show the CHEMOTHERAPY ALERT CARD or  IMMUNOTHERAPY ALERT CARD at check-in to the Emergency Department and triage nurse.  Should you have questions after your visit or need to cancel or reschedule your appointment, please contact [facility] - A DE[facility]  Dept: [phone]  and follow the prompts.  Office hours are 8:00 a.m. to 4:30 p.m. Monday - Friday. Please note that voicemails left after 4:00 p.m. may not be returned until the following business day.  We are closed weekends and major holidays. You have access to a nurse at all times for urgent questions. Please call the main number to the clinic Dept: [phone] and follow the prompts.   For any non-urgent questions, you may also contact your provider using MyChart. We now offer e-Visits for anyone 25 and older to request care online for non-urgent symptoms. For details visit [email or web address].   Also download the MyChart app! Go to the app store, search "MyChart", open the app, select [facility], and log in with your MyChart username and password.

## 2023-07-30 NOTE — Progress Notes (Signed)
 [facility] OFFICE PROGRESS NOTE  Patient Care Team: [name], MD as PCP - General (Family Medicine) [name], MD as PCP - Cardiology (Cardiology)  Assessment & Plan Multiple myeloma without remission [facility]) Recent myeloma panel showed positive response to therapy We will continue treatment as prescribed He will only take dexamethasone once a week, due to fluid retention, he will reduce it to 16 mg once a week He will continue acyclovir for antimicrobial prophylaxis He will continue aspirin for DVT prophylaxis He will not get Zometa yet until he get dental clearance; with recent fractures, I recommend he proceed with dental clearance now that his blood count has improved He will resume lenalidomide today Deficiency anemia Multifactorial This has improved since we put Revlimid on hold Cancer associated pain He has multifactorial pain, likely exacerbated by recent rib fractures I recommend starting him on long-acting MS Contin 30 mg twice daily along with oxycodone as needed for breakthrough pain I warned him about risk of sedation and constipation I will reassess pain control next week Hypervolemia, unspecified hypervolemia type He is prone to get fluid overload With aggressive diuresis, we are able to get his weight down to under 220 by the time he was discharged but he is weight is now creeping up again I recommend the patient to resume aggressive diuretic therapy He will take furosemide 40 mg daily in the morning In addition, for the next 3 days, he will take 20 mg in the afternoon along with 1 dose of spironolactone Mild protein-calorie malnutrition (HCC) His nutritional status has improved since he went back home He is currently on high-protein diet I recommend the patient to have frequent small meals and focus on protein  No orders of the defined types were placed in this encounter.    Daniel Delay, MD  INTERVAL HISTORY: he returns for chemo  follow-up Complications related to previous cycle of chemotherapy included anemia,, weight loss,, cancer associated pain,, and recent hospitalization/ ER visit,  PHYSICAL EXAMINATION: ECOG PERFORMANCE STATUS: 1 - Symptomatic but completely ambulatory  Vitals:   07/30/23 0901  BP: (!) 127/50  Pulse: 87  Resp: 18  SpO2: 95%   Filed Weights   07/30/23 0901  Weight: 220 lb 12.8 oz (100.2 kg)    Relevant data for this visit included recent CBC and CMP  SUMMARY OF ONCOLOGIC HISTORY: Oncology History Overview Note  Myeloma FISH was neg Normal cytogenetics   Multiple myeloma without remission (HCC)  07/11/2013 Initial Diagnosis   Multiple myeloma without remission (HCC)   04/22/2023 PET scan   NM PET Image Initial (PI) Whole Body (F-18 FDG)  Result Date: 05/03/2023 CLINICAL DATA:  Subsequent treatment strategy for multiple myeloma. EXAM: NUCLEAR MEDICINE PET WHOLE BODY TECHNIQUE: 13 mCi F-18 FDG was injected intravenously. Full-ring PET imaging was performed from the head to foot after the radiotracer. CT data was obtained and used for attenuation correction and anatomic localization. Fasting blood glucose: 114 mg/dl COMPARISON:  None Available. FINDINGS: Mediastinal blood pool activity: SUV max 2.0 HEAD/NECK: No hypermetabolic activity in the scalp. No hypermetabolic cervical lymph nodes. Incidental CT findings: Is CHEST: No hypermetabolic mediastinal or hilar nodes. No suspicious pulmonary nodules on the CT scan. Incidental CT findings: none ABDOMEN/PELVIS: No abnormal hypermetabolic activity within the liver, pancreas, adrenal glands, or spleen. No hypermetabolic lymph nodes in the abdomen or pelvis. Incidental CT findings: none SKELETON: There multiple foci focal radiotracer activity within the skeleton. Lesions are accompaniedlytic change on CT portion exam. There approximately  50 lesions. For example lesion the posterior RIGHT fifth rib with SUV max equal 3.6 on image 120. There is a  lucent lesion and pathologic fracture associated with hypermetabolic activity the posterior LEFT sixth rib with SUV max equal 6.4 on image 129. Multiple lesions throughout the spine. Most intense lesion spinal lesion has associated soft tissue expansion at the the T2 vertebral body measuring 2.5 cm (107) with SUV max equal 7.3 Lesion in the inferior LEFT pubic rami subtle lucency on image 299. Suspicious lesions in LEFT and RIGHT femoral necks. Incidental CT findings: none EXTREMITIES: Hypermetabolic lesions RIGHT humeral shaft. Lesion the proximal LEFT and RIGHT femurs. Incidental CT findings: none IMPRESSION: 1. Multiple hypermetabolic lytic lesions throughout the skeleton consistent with multiple myeloma. Approximately 50 lesions within the axillary and appendicular skeleton. Many lesions accompanied lytic/lucent lesions on CT. 2. No evidence of soft tissue plasmacytoma. Electronically Signed   By: Daniel Whitaker M.D.   On: 05/03/2023 15:16   CT Chest W Contrast  Result Date: 05/03/2023 CLINICAL DATA:  Pneumonia with complication suspected. X-ray done. Cough with rib pain after coughing. Shortness of breath and right-sided pain. History of multiple myeloma. EXAM: CT CHEST WITH CONTRAST TECHNIQUE: Multidetector CT imaging of the chest was performed during intravenous contrast administration. RADIATION DOSE REDUCTION: This exam was performed according to the departmental dose-optimization program which includes automated exposure control, adjustment of the mA and/or kV according to patient size and/or use of iterative reconstruction technique. CONTRAST:  60mL OMNIPAQUE IOHEXOL 300 MG/ML  SOLN COMPARISON:  Chest radiograph 05/02/2023.  PET-CT 04/22/2023 FINDINGS: Cardiovascular: Mild cardiac enlargement. No pericardial effusions. Normal caliber thoracic aorta. Calcification of the aorta and coronary arteries. Mediastinum/Nodes: Thyroid gland is unremarkable. Esophagus is decompressed. Scattered mediastinal  lymph nodes are not pathologically enlarged. Lungs/Pleura: Motion artifact limits examination. There is evidence of atelectasis or consolidation in both lung bases. Small bilateral pleural effusions. Changes could represent pneumonia. No pneumothorax. Upper Abdomen: No acute abnormalities. Musculoskeletal: Degenerative changes in the spine and shoulders. Multiple lucent lesions demonstrated in the ribs, spine, and shoulders consistent with history of myeloma. Fractures of the right lateral fifth, sixth, and seventh ribs as well as the right tenth rib. Fractures of the left third, fourth, and sixth ribs. No vertebral compression deformities are demonstrated although large lucent lesions are demonstrated in the T1 and T2 vertebrae. IMPRESSION: 1. Multiple lucent lesions demonstrated in the ribs, spine, and shoulders corresponding to the patient's history of multiple myeloma. 2. Multiple bilateral acute rib fractures. 3. Small bilateral pleural effusions with basilar atelectasis or consolidation, possibly pneumonia. Electronically Signed   By: Burman Nieves M.D.   On: 05/03/2023 01:21   DG Chest Port 1 View  Result Date: 05/02/2023 CLINICAL DATA:  Shortness of breath and cough. History of multiple myeloma. Right-sided pain. EXAM: PORTABLE CHEST 1 VIEW COMPARISON:  04/20/2023 FINDINGS: Shallow inspiration. Cardiac enlargement. No vascular congestion or edema. Linear atelectasis in the lung bases. No pleural effusions. No pneumothorax. Calcification of the aorta. A nondisplaced right lateral ninth rib fracture is suggested. IMPRESSION: Shallow inspiration with atelectasis in the lung bases. Cardiac enlargement. Probable right ninth rib fracture. Electronically Signed   By: Burman Nieves M.D.   On: 05/02/2023 23:45   CT BONE MARROW BIOPSY & ASPIRATION  Result Date: 04/27/2023 INDICATION: myeloma staging EXAM: CT GUIDED BONE MARROW ASPIRATION AND CORE BIOPSY MEDICATIONS: None. ANESTHESIA/SEDATION: Moderate  (conscious) sedation was employed during this procedure. A total of Versed 2 mg and Fentanyl 100 mcg was administered  intravenously. Moderate Sedation Time: 13 minutes. The patient's level of consciousness and vital signs were monitored continuously by radiology nursing throughout the procedure under my direct supervision. FLUOROSCOPY TIME:  CT dose; 122 mGycm COMPLICATIONS: None immediate. Estimated blood loss: <5 mL PROCEDURE: RADIATION DOSE REDUCTION: This exam was performed according to the departmental dose-optimization program which includes automated exposure control, adjustment of the mA and/or kV according to patient size and/or use of iterative reconstruction technique. Informed written consent was obtained from the patient after a thorough discussion of the procedural risks, benefits and alternatives. All questions were addressed. Maximal Sterile Barrier Technique was utilized including caps, mask, sterile gowns, sterile gloves, sterile drape, hand hygiene and skin antiseptic. A timeout was performed prior to the initiation of the procedure. The patient was positioned prone and non-contrast localization CT was performed of the pelvis to demonstrate the iliac marrow spaces. Maximal barrier sterile technique utilized including caps, mask, sterile gowns, sterile gloves, large sterile drape, hand hygiene, and chlorhexidine prep. Under sterile conditions and local anesthesia, an 11 gauge coaxial bone biopsy needle was advanced into the RIGHT iliac marrow space. Needle position was confirmed with CT imaging. Initially, bone marrow aspiration was performed. Next, the 11 gauge outer cannula was utilized to obtain a 1 iliac bone marrow core biopsy. Needle was removed. Hemostasis was obtained with compression. The patient tolerated the procedure well. Samples were prepared with the cytotechnologist. IMPRESSION: Successful CT-guided bone marrow aspiration and biopsy. Daniel Banning, MD [facility] Radiology Electronically Signed   By: Daniel Whitaker M.D.   On: 04/27/2023 12:32   DG Chest 2 View  Result Date: 04/23/2023 CLINICAL DATA:  Cough for 2 months Pain between shoulder blades EXAM: CHEST - 2 VIEW COMPARISON:  08/24/2019 FINDINGS: Cardiomediastinal silhouette and pulmonary vasculature are within normal limits. Lungs are hyperexpanded but otherwise clear. Unchanged dextroconvex curvature of the thoracolumbar spine. IMPRESSION: 1. No acute cardiopulmonary process. 2. Hyperexpanded lungs, suspicious for emphysema. Electronically Signed   By: Acquanetta Belling M.D.   On: 04/23/2023 16:44      04/27/2023 Pathology Results   Surgical Pathology CASE: [id number] PATIENT: Stephanie Coup Bone Marrow Report  Clinical History: Myeloma  DIAGNOSIS:  BONE MARROW, ASPIRATE, CLOT, CORE: -Hypercellular bone marrow with plasma cell neoplasm -See comment  PERIPHERAL BLOOD: -Normocytic-normochromic anemia -Leukocytosis with neutrophilic left shift  COMMENT:  The bone marrow is hypercellular for age with increased number of plasma cells averaging 15% of all cells in the aspirate.  The clot and biopsy sections show interstitial infiltrates, numerous variably sized clusters in addition to an area of diffuse sheet of atypical plasma cells associated with kappa light chain restriction consistent with plasma cell neoplasm.  Correlation with cytogenetic and FISH studies is recommended.  MICROSCOPIC DESCRIPTION:  PERIPHERAL BLOOD SMEAR: The red blood cells display mild anisopoikilocytosis with mild to moderate polychromasia.  The white blood cells are slightly increased in number, mostly with neutrophils some of which display toxic granulation.  An occasional myelocyte is seen on scan.  The platelets are normal in number.  BONE MARROW ASPIRATE: Bone marrow particles present Erythroid precursors: Orderly and progressive maturation Granulocytic precursors: Orderly and  progressive maturation for the most part Megakaryocytes: Abundant with scattered small and/or hypolobated forms or large cells. Lymphocytes/plasma cells: The plasma cells are increased in number representing 15% to all cells associated with atypical cytologic features characterized by cytomegaly and/or small nucleoli.  Large lymphoid aggregates are not seen.  TOUCH PREPARATIONS: A mixture of  cell types present but with increased number of plasma cells  CLOT AND BIOPSY: The sections show variable cellularity ranging from 40 to 90% focally.  There are numerous variably sized clusters of atypical plasma cells in addition to a diffuse large sheet particularly in the area of most cellularity.  The background shows trilineage hematopoiesis.  Immunohistochemical stains for CD138 in addition to in situ hybridization for kappa and lambda were performed with appropriate controls.  CD138 highlights the increased plasma cell component consisting of interstitial infiltrates, numerous variably sized clusters, and an area of diffuse sheet particularly in the core biopsy. The plasma cells display kappa light chain restriction.  IRON STAIN: Iron stains are performed on a bone marrow aspirate or touch imprint smear and section of clot. The controls stained appropriately.       Storage Iron: Decreased      Ring Sideroblasts: Absent  ADDITIONAL DATA/TESTING: The specimen was sent for cytogenetic analysis and FISH for multiple myeloma and a separate report will follow.    05/04/2023 Cancer Staging   Staging form: Plasma Cell Myeloma and Plasma Cell Disorders, AJCC 8th Edition - Clinical stage from 05/04/2023: RISS Stage II (Beta-2-microglobulin (mg/L): 5.6, Albumin (g/dL): 2.5, ISS: Stage III, High-risk cytogenetics: Absent, LDH: Normal) - Signed by Daniel Delay, MD on 05/11/2023 Stage prefix: Initial diagnosis Beta 2 microglobulin range (mg/L): Greater than or equal to 5.5 Albumin range (g/dL): Less than  3.5 Cytogenetics: No abnormalities   05/14/2023 -  Chemotherapy   Patient is on Treatment Plan : MYELOMA NEWLY DIAGNOSED TRANSPLANT CANDIDATE DaraVRd (Daratumumab SQ) q21d x 6 Cycles (Induction/Consolidation)     06/18/2023 Imaging   RIGHT:         - There is no evidence of deep vein thrombosis in the lower extremity.   - No cystic structure found in the popliteal fossa.   LEFT: - No evidence of common femoral vein obstruction.

## 2023-07-30 NOTE — Progress Notes (Signed)
 Pt states he took steroids prior to arrival.

## 2023-07-30 NOTE — Assessment & Plan Note (Addendum)
 He is prone to get fluid overload With aggressive diuresis, we are able to get his weight down to under 220 by the time he was discharged but he is weight is now creeping up again I recommend the patient to resume aggressive diuretic therapy He will take furosemide 40 mg daily in the morning In addition, for the next 3 days, he will take 20 mg in the afternoon along with 1 dose of spironolactone

## 2023-07-30 NOTE — Assessment & Plan Note (Addendum)
 Recent myeloma panel showed positive response to therapy We will continue treatment as prescribed He will only take dexamethasone once a week, due to fluid retention, he will reduce it to 16 mg once a week He will continue acyclovir for antimicrobial prophylaxis He will continue aspirin for DVT prophylaxis He will not get Zometa yet until he get dental clearance; with recent fractures, I recommend he proceed with dental clearance now that his blood count has improved He will resume lenalidomide today

## 2023-07-30 NOTE — Assessment & Plan Note (Addendum)
 He has multifactorial pain, likely exacerbated by recent rib fractures I recommend starting him on long-acting MS Contin 30 mg twice daily along with oxycodone as needed for breakthrough pain I warned him about risk of sedation and constipation I will reassess pain control next week

## 2023-07-30 NOTE — Assessment & Plan Note (Addendum)
 His nutritional status has improved since he went back home He is currently on high-protein diet I recommend the patient to have frequent small meals and focus on protein

## 2023-08-03 ENCOUNTER — Other Ambulatory Visit (HOSPITAL_COMMUNITY): Payer: Medicare Other

## 2023-08-03 ENCOUNTER — Encounter: Payer: Self-pay | Admitting: Hematology and Oncology

## 2023-08-04 ENCOUNTER — Inpatient Hospital Stay: Attending: Hematology and Oncology | Admitting: Hematology and Oncology

## 2023-08-04 ENCOUNTER — Encounter: Payer: Self-pay | Admitting: Hematology and Oncology

## 2023-08-04 VITALS — BP 132/53 | HR 76 | Temp 98.0°F | Resp 18 | Ht 72.0 in | Wt 217.4 lb

## 2023-08-04 DIAGNOSIS — G893 Neoplasm related pain (acute) (chronic): Secondary | ICD-10-CM | POA: Insufficient documentation

## 2023-08-04 DIAGNOSIS — K089 Disorder of teeth and supporting structures, unspecified: Secondary | ICD-10-CM | POA: Insufficient documentation

## 2023-08-04 DIAGNOSIS — Z79899 Other long term (current) drug therapy: Secondary | ICD-10-CM | POA: Diagnosis not present

## 2023-08-04 DIAGNOSIS — R0601 Orthopnea: Secondary | ICD-10-CM

## 2023-08-04 DIAGNOSIS — C9000 Multiple myeloma not having achieved remission: Secondary | ICD-10-CM | POA: Insufficient documentation

## 2023-08-04 DIAGNOSIS — E8770 Fluid overload, unspecified: Secondary | ICD-10-CM | POA: Insufficient documentation

## 2023-08-04 DIAGNOSIS — Z5112 Encounter for antineoplastic immunotherapy: Secondary | ICD-10-CM | POA: Insufficient documentation

## 2023-08-04 MED ORDER — OXYCODONE HCL 10 MG PO TABS: 10.0000 mg | ORAL_TABLET | ORAL | Status: DC | PRN

## 2023-08-04 MED ORDER — DEXAMETHASONE 4 MG PO TABS: ORAL_TABLET | ORAL | Status: DC

## 2023-08-04 NOTE — Assessment & Plan Note (Addendum)
 He has IgA kappa multiple myeloma with multiple bone fractures at presentation He has good partial response with combination chemotherapy with daratumumab, lenalidomide, bortezomib and dexamethasone He is not receiving Zometa, pending dental clearance He has recent exacerbation of bone pain likely due to steroid withdrawal I recommend reintroduction of dexamethasone daily at 4 mg except on Fridays, he will take 16 mg dose I will see him again in 2 days for further follow-up He will continue aspirin for DVT prophylaxis He will continue vitamin D supplement and calcium He is on acyclovir for antimicrobial prophylaxis

## 2023-08-04 NOTE — Assessment & Plan Note (Addendum)
 He had recent sensation of orthopnea The patient has not been using his CPAP for some time I suspect the patient has difficulties with orthopnea due to nocturnal hypoxia I recommend reintroduction of CPAP after adequate pain control I recommend the patient and family members to check his oxygen saturation periodically His examination today is benign and I do not believe the patient has fluid overload He will continue furosemide at 40 mg daily

## 2023-08-04 NOTE — Progress Notes (Signed)
 [facility] OFFICE PROGRESS NOTE  Patient Care Team: [name], MD as PCP - General (Family Medicine) [name], MD as PCP - Cardiology (Cardiology)  Assessment & Plan Multiple myeloma without remission [facility]) He has IgA kappa multiple myeloma with multiple bone fractures at presentation He has good partial response with combination chemotherapy with daratumumab, lenalidomide, bortezomib and dexamethasone He is not receiving Zometa, pending dental clearance He has recent exacerbation of bone pain likely due to steroid withdrawal I recommend reintroduction of dexamethasone daily at 4 mg except on Fridays, he will take 16 mg dose I will see him again in 2 days for further follow-up He will continue aspirin for DVT prophylaxis He will continue vitamin D supplement and calcium He is on acyclovir for antimicrobial prophylaxis Cancer associated pain He has recent exacerbation of bone pain due to steroid withdrawal As above, we will introduce dexamethasone He has poor tolerance to MS Contin at 30 mg twice daily I plan to reduce it to 15 mg nightly He will continue oxycodone as needed along with acetaminophen I will reassess pain control in his next visit Orthopnea He had recent sensation of orthopnea The patient has not been using his CPAP for some time I suspect the patient has difficulties with orthopnea due to nocturnal hypoxia I recommend reintroduction of CPAP after adequate pain control I recommend the patient and family members to check his oxygen saturation periodically His examination today is benign and I do not believe the patient has fluid overload He will continue furosemide at 40 mg daily Poor dentition According to his dentist, he needs several procedures for complete extraction of that dentition On March 24, he is scheduled for incision of the gum with extraction of a broken tooth I will adjust his chemotherapy accordingly closer to the time of  his procedure  No orders of the defined types were placed in this encounter.    [name], MD  INTERVAL HISTORY: he returns for urgent treatment follow-up The patient was family contacted me He is having significant difficulties with pain/panic attack as well as orthopnea He had a good day on Saturday Starting Sunday morning, he experienced significant difficulties with breathing when he wakes up in the morning His family noticed some low oxygen saturation upon waking up He started using oxygen round-the-clock and sleep on the recliner His family members have been giving him morphine sulfate but noticed excessive sedation and occasional confusion On average, he needed to take at least 4 doses of breakthrough oxycodone if he does not take morphine sulfate His appetite has been poor His weight has been under 220 pounds and his leg swelling has improved since the last time I saw him On further review, it was noted that the patient has used CPAP for a long time but none recently since recent hospitalization PHYSICAL EXAMINATION: ECOG PERFORMANCE STATUS: 2 - Symptomatic, <50% confined to bed  Vitals:   08/04/23 1212  BP: (!) 132/53  Pulse: 76  Resp: 18  Temp: 98 F (36.7 C)  SpO2: 95%   Filed Weights   08/04/23 1212  Weight: 217 lb 6.4 oz (98.6 kg)  Examination of his chest reveal good breath sounds bilaterally His leg swelling is minimum bilaterally

## 2023-08-04 NOTE — Assessment & Plan Note (Addendum)
 According to his dentist, he needs several procedures for complete extraction of that dentition On March 24, he is scheduled for incision of the gum with extraction of a broken tooth I will adjust his chemotherapy accordingly closer to the time of his procedure

## 2023-08-04 NOTE — Assessment & Plan Note (Addendum)
 He has recent exacerbation of bone pain due to steroid withdrawal As above, we will introduce dexamethasone He has poor tolerance to MS Contin at 30 mg twice daily I plan to reduce it to 15 mg nightly He will continue oxycodone as needed along with acetaminophen I will reassess pain control in his next visit

## 2023-08-06 ENCOUNTER — Encounter: Payer: Self-pay | Admitting: Hematology and Oncology

## 2023-08-06 ENCOUNTER — Ambulatory Visit: Payer: Medicare Other | Admitting: General Practice

## 2023-08-06 ENCOUNTER — Inpatient Hospital Stay: Payer: Medicare Other | Admitting: Hematology and Oncology

## 2023-08-06 ENCOUNTER — Inpatient Hospital Stay (HOSPITAL_BASED_OUTPATIENT_CLINIC_OR_DEPARTMENT_OTHER): Payer: Medicare Other

## 2023-08-06 ENCOUNTER — Inpatient Hospital Stay: Payer: Medicare Other

## 2023-08-06 VITALS — BP 120/43 | HR 68 | Resp 18 | Ht 72.0 in | Wt 220.0 lb

## 2023-08-06 DIAGNOSIS — D539 Nutritional anemia, unspecified: Secondary | ICD-10-CM | POA: Diagnosis not present

## 2023-08-06 DIAGNOSIS — G893 Neoplasm related pain (acute) (chronic): Secondary | ICD-10-CM | POA: Diagnosis not present

## 2023-08-06 DIAGNOSIS — Z79899 Other long term (current) drug therapy: Secondary | ICD-10-CM | POA: Diagnosis not present

## 2023-08-06 DIAGNOSIS — C9000 Multiple myeloma not having achieved remission: Secondary | ICD-10-CM

## 2023-08-06 DIAGNOSIS — Z5112 Encounter for antineoplastic immunotherapy: Secondary | ICD-10-CM | POA: Diagnosis not present

## 2023-08-06 DIAGNOSIS — E8770 Fluid overload, unspecified: Secondary | ICD-10-CM | POA: Diagnosis not present

## 2023-08-06 LAB — CBC WITH DIFFERENTIAL (CANCER CENTER ONLY)
Abs Immature Granulocytes: 0.02 10*3/uL (ref 0.00–0.07)
Basophils Absolute: 0 10*3/uL (ref 0.0–0.1)
Basophils Relative: 0 %
Eosinophils Absolute: 0.5 10*3/uL (ref 0.0–0.5)
Eosinophils Relative: 6 %
HCT: 29.3 % — ABNORMAL LOW (ref 39.0–52.0)
Hemoglobin: 9.1 g/dL — ABNORMAL LOW (ref 13.0–17.0)
Immature Granulocytes: 0 %
Lymphocytes Relative: 7 %
Lymphs Abs: 0.6 10*3/uL — ABNORMAL LOW (ref 0.7–4.0)
MCH: 27.6 pg (ref 26.0–34.0)
MCHC: 31.1 g/dL (ref 30.0–36.0)
MCV: 88.8 fL (ref 80.0–100.0)
Monocytes Absolute: 0.4 10*3/uL (ref 0.1–1.0)
Monocytes Relative: 4 %
Neutro Abs: 7.6 10*3/uL (ref 1.7–7.7)
Neutrophils Relative %: 83 %
Platelet Count: 256 10*3/uL (ref 150–400)
RBC: 3.3 MIL/uL — ABNORMAL LOW (ref 4.22–5.81)
RDW: 17 % — ABNORMAL HIGH (ref 11.5–15.5)
WBC Count: 9.1 10*3/uL (ref 4.0–10.5)
nRBC: 0 % (ref 0.0–0.2)

## 2023-08-06 LAB — CMP (CANCER CENTER ONLY)
ALT: 11 U/L (ref 0–44)
AST: 9 U/L — ABNORMAL LOW (ref 15–41)
Albumin: 3.2 g/dL — ABNORMAL LOW (ref 3.5–5.0)
Alkaline Phosphatase: 92 U/L (ref 38–126)
Anion gap: 8 (ref 5–15)
BUN: 34 mg/dL — ABNORMAL HIGH (ref 8–23)
CO2: 27 mmol/L (ref 22–32)
Calcium: 9.8 mg/dL (ref 8.9–10.3)
Chloride: 104 mmol/L (ref 98–111)
Creatinine: 0.87 mg/dL (ref 0.61–1.24)
GFR, Estimated: >60 mL/min (ref >60)
Glucose, Bld: 102 mg/dL — ABNORMAL HIGH (ref 70–99)
Potassium: 3.5 mmol/L (ref 3.5–5.1)
Sodium: 139 mmol/L (ref 135–145)
Total Bilirubin: 0.3 mg/dL (ref 0.0–1.2)
Total Protein: 8 g/dL (ref 6.5–8.1)

## 2023-08-06 MED ORDER — ACETAMINOPHEN 325 MG PO TABS
650.0000 mg | ORAL_TABLET | Freq: Once | ORAL | Status: AC
Start: 2023-08-06 — End: 2023-08-06
  Administered 2023-08-06: 650 mg via ORAL
  Filled 2023-08-06: qty 2

## 2023-08-06 MED ORDER — DARATUMUMAB-HYALURONIDASE-FIHJ 1800-30000 MG-UT/15ML ~~LOC~~ SOLN
1800.0000 mg | Freq: Once | SUBCUTANEOUS | Status: AC
  Administered 2023-08-06: 1800 mg via SUBCUTANEOUS
  Filled 2023-08-06: qty 15

## 2023-08-06 MED ORDER — DIPHENHYDRAMINE HCL 25 MG PO CAPS
25.0000 mg | ORAL_CAPSULE | Freq: Once | ORAL | Status: AC
  Administered 2023-08-06: 25 mg via ORAL
  Filled 2023-08-06: qty 1

## 2023-08-06 MED ORDER — BORTEZOMIB CHEMO SQ INJECTION 3.5 MG (2.5MG/ML)
1.0400 mg/m2 | Freq: Once | INTRAMUSCULAR | Status: AC
  Administered 2023-08-06: 2.25 mg via SUBCUTANEOUS
  Filled 2023-08-06: qty 0.9

## 2023-08-06 NOTE — Progress Notes (Signed)
 [facility] OFFICE PROGRESS NOTE  Patient Care Team: [name], MD as PCP - General (Family Medicine) [name], MD as PCP - Cardiology (Cardiology)  Assessment & Plan Multiple myeloma without remission [facility]) He has IgA kappa multiple myeloma with multiple bone fractures at presentation He has good partial response with combination chemotherapy with daratumumab, lenalidomide, bortezomib and dexamethasone He is not receiving Zometa, pending dental clearance He has recent exacerbation of bone pain likely due to steroid withdrawal I recommend reintroduction of dexamethasone every other at 4 mg except on Fridays, he will take 16 mg dose I will see him again in 7 days for further follow-up He will continue aspirin for DVT prophylaxis He will continue vitamin D supplement and calcium He is on acyclovir for antimicrobial prophylaxis Deficiency anemia Multifactorial This is stable Monitor closely Hypervolemia, unspecified hypervolemia type He is prone to get fluid overload With aggressive diuresis, we are able to get his weight down to under 220 but with high-dose steroids, he is having fluid reaccumulation again I recommend the patient to resume aggressive diuretic therapy He will take furosemide 40 mg daily in the morning In addition, for the next 3 days, he will take 20 mg in the afternoon along with 1 dose of spironolactone Cancer associated pain He has recent exacerbation of bone pain due to steroid withdrawal Pain control is improved reintroduction of dexamethasone and MS Contin at night He will continue oxycodone as needed along with acetaminophen I will reassess pain control in his next visit  No orders of the defined types were placed in this encounter.    [name], MD  INTERVAL HISTORY: he returns for treatment follow-up Complications related to previous cycle of chemotherapy included anemia, and cancer associated pain, Since last time I saw  him, he is a bit better No more confusion episode He is using CPAP at night but still need to sleep in recliner Leg swelling is stable We reviewed test results and discussed changes in his medications and plan to move forward with chemotherapy I plan to hold off IVIG pending results of his IgG level  PHYSICAL EXAMINATION: ECOG PERFORMANCE STATUS: 1 - Symptomatic but completely ambulatory  Vitals:   08/06/23 1044  BP: (!) 120/43  Pulse: 68  Resp: 18  SpO2: 96%   Filed Weights   08/06/23 1044  Weight: 220 lb (99.8 kg)    Relevant data reviewed during this visit included CBC and CMP

## 2023-08-06 NOTE — Assessment & Plan Note (Addendum)
 He has IgA kappa multiple myeloma with multiple bone fractures at presentation He has good partial response with combination chemotherapy with daratumumab, lenalidomide, bortezomib and dexamethasone He is not receiving Zometa, pending dental clearance He has recent exacerbation of bone pain likely due to steroid withdrawal I recommend reintroduction of dexamethasone every other at 4 mg except on Fridays, he will take 16 mg dose I will see him again in 7 days for further follow-up He will continue aspirin for DVT prophylaxis He will continue vitamin D supplement and calcium He is on acyclovir for antimicrobial prophylaxis

## 2023-08-06 NOTE — Assessment & Plan Note (Addendum)
 He has recent exacerbation of bone pain due to steroid withdrawal Pain control is improved reintroduction of dexamethasone and MS Contin at night He will continue oxycodone as needed along with acetaminophen I will reassess pain control in his next visit

## 2023-08-06 NOTE — Assessment & Plan Note (Addendum)
 Multifactorial This is stable Monitor closely

## 2023-08-06 NOTE — Progress Notes (Signed)
 Pt reports he took decadron at home prior to arriving for infusion.

## 2023-08-06 NOTE — Assessment & Plan Note (Addendum)
 He is prone to get fluid overload With aggressive diuresis, we are able to get his weight down to under 220 but with high-dose steroids, he is having fluid reaccumulation again I recommend the patient to resume aggressive diuretic therapy He will take furosemide 40 mg daily in the morning In addition, for the next 3 days, he will take 20 mg in the afternoon along with 1 dose of spironolactone

## 2023-08-06 NOTE — Patient Instructions (Signed)
 [facility] A DEPT OF [facility]  Discharge Instructions: Thank you for choosing [facility] to provide your oncology and hematology care.   If you have a lab appointment with the Cancer Center, please go directly to the Cancer Center and check in at the registration area.   Wear comfortable clothing and clothing appropriate for easy access to any Portacath or PICC line.   We strive to give you quality time with your provider. You may need to reschedule your appointment if you arrive late (15 or more minutes).  Arriving late affects you and other patients whose appointments are after yours.  Also, if you miss three or more appointments without notifying the office, you may be dismissed from the clinic at the provider's discretion.      For prescription refill requests, have your pharmacy contact our office and allow 72 hours for refills to be completed.    Today you received the following chemotherapy and/or immunotherapy agents: Velcade, Dara Faspro.       To help prevent nausea and vomiting after your treatment, we encourage you to take your nausea medication as directed.  BELOW ARE SYMPTOMS THAT SHOULD BE REPORTED IMMEDIATELY: *FEVER GREATER THAN 100.4 F (38 C) OR HIGHER *CHILLS OR SWEATING *NAUSEA AND VOMITING THAT IS NOT CONTROLLED WITH YOUR NAUSEA MEDICATION *UNUSUAL SHORTNESS OF BREATH *UNUSUAL BRUISING OR BLEEDING *URINARY PROBLEMS (pain or burning when urinating, or frequent urination) *BOWEL PROBLEMS (unusual diarrhea, constipation, pain near the anus) TENDERNESS IN MOUTH AND THROAT WITH OR WITHOUT PRESENCE OF ULCERS (sore throat, sores in mouth, or a toothache) UNUSUAL RASH, SWELLING OR PAIN  UNUSUAL VAGINAL DISCHARGE OR ITCHING   Items with * indicate a potential emergency and should be followed up as soon as possible or go to the Emergency Department if any problems should occur.  Please show the CHEMOTHERAPY ALERT CARD or  IMMUNOTHERAPY ALERT CARD at check-in to the Emergency Department and triage nurse.  Should you have questions after your visit or need to cancel or reschedule your appointment, please contact [facility]  Dept: [phone]  and follow the prompts.  Office hours are 8:00 a.m. to 4:30 p.m. Monday - Friday. Please note that voicemails left after 4:00 p.m. may not be returned until the following business day.  We are closed weekends and major holidays. You have access to a nurse at all times for urgent questions. Please call the main number to the clinic Dept: [phone] and follow the prompts.   For any non-urgent questions, you may also contact your provider using MyChart. We now offer e-Visits for anyone 75 and older to request care online for non-urgent symptoms. For details visit [email or web address].   Also download the MyChart app! Go to the app store, search "MyChart", open the app, select , and log in with your MyChart username and password.

## 2023-08-09 ENCOUNTER — Other Ambulatory Visit: Payer: Self-pay | Admitting: Cardiology

## 2023-08-09 LAB — MULTIPLE MYELOMA PANEL, SERUM
Albumin SerPl Elph-Mcnc: 3 g/dL (ref 2.9–4.4)
Albumin/Glob SerPl: 0.7 (ref 0.7–1.7)
Alpha 1: 0.4 g/dL (ref 0.0–0.4)
Alpha2 Glob SerPl Elph-Mcnc: 0.9 g/dL (ref 0.4–1.0)
B-Globulin SerPl Elph-Mcnc: 0.9 g/dL (ref 0.7–1.3)
Gamma Glob SerPl Elph-Mcnc: 2.6 g/dL — ABNORMAL HIGH (ref 0.4–1.8)
Globulin, Total: 4.8 g/dL — ABNORMAL HIGH (ref 2.2–3.9)
IgA: 2422 mg/dL — ABNORMAL HIGH (ref 61–437)
IgG (Immunoglobin G), Serum: 516 mg/dL — ABNORMAL LOW (ref 603–1613)
IgM (Immunoglobulin M), Srm: 17 mg/dL (ref 15–143)
M Protein SerPl Elph-Mcnc: 2.2 g/dL — ABNORMAL HIGH
Total Protein ELP: 7.8 g/dL (ref 6.0–8.5)

## 2023-08-09 LAB — KAPPA/LAMBDA LIGHT CHAINS
Kappa free light chain: 278.4 mg/L — ABNORMAL HIGH (ref 3.3–19.4)
Kappa, lambda light chain ratio: 139.2 — ABNORMAL HIGH (ref 0.26–1.65)
Lambda free light chains: 2 mg/L — ABNORMAL LOW (ref 5.7–26.3)

## 2023-08-13 ENCOUNTER — Inpatient Hospital Stay (HOSPITAL_BASED_OUTPATIENT_CLINIC_OR_DEPARTMENT_OTHER): Payer: Medicare Other | Admitting: Hematology and Oncology

## 2023-08-13 ENCOUNTER — Inpatient Hospital Stay: Payer: Medicare Other

## 2023-08-13 ENCOUNTER — Other Ambulatory Visit: Payer: Self-pay

## 2023-08-13 ENCOUNTER — Telehealth: Payer: Self-pay

## 2023-08-13 ENCOUNTER — Encounter: Payer: Self-pay | Admitting: Hematology and Oncology

## 2023-08-13 ENCOUNTER — Other Ambulatory Visit: Payer: Self-pay | Admitting: Hematology and Oncology

## 2023-08-13 VITALS — BP 126/45 | HR 59 | Resp 18 | Ht 72.0 in | Wt 224.8 lb

## 2023-08-13 DIAGNOSIS — L89322 Pressure ulcer of left buttock, stage 2: Secondary | ICD-10-CM | POA: Diagnosis not present

## 2023-08-13 DIAGNOSIS — E8770 Fluid overload, unspecified: Secondary | ICD-10-CM

## 2023-08-13 DIAGNOSIS — R0601 Orthopnea: Secondary | ICD-10-CM | POA: Diagnosis not present

## 2023-08-13 DIAGNOSIS — J45909 Unspecified asthma, uncomplicated: Secondary | ICD-10-CM | POA: Diagnosis not present

## 2023-08-13 DIAGNOSIS — R509 Fever, unspecified: Secondary | ICD-10-CM | POA: Diagnosis not present

## 2023-08-13 DIAGNOSIS — I110 Hypertensive heart disease with heart failure: Secondary | ICD-10-CM | POA: Diagnosis not present

## 2023-08-13 DIAGNOSIS — A419 Sepsis, unspecified organism: Secondary | ICD-10-CM | POA: Diagnosis not present

## 2023-08-13 DIAGNOSIS — E041 Nontoxic single thyroid nodule: Secondary | ICD-10-CM | POA: Diagnosis not present

## 2023-08-13 DIAGNOSIS — E8581 Light chain (AL) amyloidosis: Secondary | ICD-10-CM | POA: Diagnosis not present

## 2023-08-13 DIAGNOSIS — C9000 Multiple myeloma not having achieved remission: Secondary | ICD-10-CM

## 2023-08-13 DIAGNOSIS — J9621 Acute and chronic respiratory failure with hypoxia: Secondary | ICD-10-CM | POA: Diagnosis not present

## 2023-08-13 DIAGNOSIS — G893 Neoplasm related pain (acute) (chronic): Secondary | ICD-10-CM | POA: Diagnosis not present

## 2023-08-13 DIAGNOSIS — R6520 Severe sepsis without septic shock: Secondary | ICD-10-CM | POA: Diagnosis not present

## 2023-08-13 DIAGNOSIS — D849 Immunodeficiency, unspecified: Secondary | ICD-10-CM | POA: Diagnosis not present

## 2023-08-13 DIAGNOSIS — J90 Pleural effusion, not elsewhere classified: Secondary | ICD-10-CM | POA: Diagnosis not present

## 2023-08-13 DIAGNOSIS — D472 Monoclonal gammopathy: Secondary | ICD-10-CM | POA: Diagnosis not present

## 2023-08-13 DIAGNOSIS — K219 Gastro-esophageal reflux disease without esophagitis: Secondary | ICD-10-CM | POA: Diagnosis not present

## 2023-08-13 DIAGNOSIS — M8458XA Pathological fracture in neoplastic disease, other specified site, initial encounter for fracture: Secondary | ICD-10-CM | POA: Diagnosis not present

## 2023-08-13 DIAGNOSIS — C9001 Multiple myeloma in remission: Secondary | ICD-10-CM | POA: Diagnosis not present

## 2023-08-13 DIAGNOSIS — E781 Pure hyperglyceridemia: Secondary | ICD-10-CM | POA: Diagnosis not present

## 2023-08-13 DIAGNOSIS — Z9981 Dependence on supplemental oxygen: Secondary | ICD-10-CM | POA: Diagnosis not present

## 2023-08-13 DIAGNOSIS — J984 Other disorders of lung: Secondary | ICD-10-CM | POA: Diagnosis not present

## 2023-08-13 DIAGNOSIS — G2581 Restless legs syndrome: Secondary | ICD-10-CM | POA: Diagnosis not present

## 2023-08-13 DIAGNOSIS — L89312 Pressure ulcer of right buttock, stage 2: Secondary | ICD-10-CM | POA: Diagnosis not present

## 2023-08-13 DIAGNOSIS — R918 Other nonspecific abnormal finding of lung field: Secondary | ICD-10-CM | POA: Diagnosis not present

## 2023-08-13 DIAGNOSIS — I77819 Aortic ectasia, unspecified site: Secondary | ICD-10-CM | POA: Diagnosis not present

## 2023-08-13 DIAGNOSIS — R0602 Shortness of breath: Secondary | ICD-10-CM | POA: Diagnosis not present

## 2023-08-13 DIAGNOSIS — I21A1 Myocardial infarction type 2: Secondary | ICD-10-CM | POA: Diagnosis not present

## 2023-08-13 DIAGNOSIS — E876 Hypokalemia: Secondary | ICD-10-CM | POA: Diagnosis not present

## 2023-08-13 DIAGNOSIS — I5033 Acute on chronic diastolic (congestive) heart failure: Secondary | ICD-10-CM | POA: Diagnosis not present

## 2023-08-13 DIAGNOSIS — I4891 Unspecified atrial fibrillation: Secondary | ICD-10-CM | POA: Diagnosis not present

## 2023-08-13 DIAGNOSIS — D638 Anemia in other chronic diseases classified elsewhere: Secondary | ICD-10-CM | POA: Diagnosis not present

## 2023-08-13 LAB — COMPREHENSIVE METABOLIC PANEL
ALT: 16 U/L (ref 0–44)
AST: 10 U/L — ABNORMAL LOW (ref 15–41)
Albumin: 3.3 g/dL — ABNORMAL LOW (ref 3.5–5.0)
Alkaline Phosphatase: 107 U/L (ref 38–126)
Anion gap: 6 (ref 5–15)
BUN: 30 mg/dL — ABNORMAL HIGH (ref 8–23)
CO2: 28 mmol/L (ref 22–32)
Calcium: 9.5 mg/dL (ref 8.9–10.3)
Chloride: 105 mmol/L (ref 98–111)
Creatinine, Ser: 0.74 mg/dL (ref 0.61–1.24)
GFR, Estimated: >60 mL/min (ref >60)
Glucose, Bld: 99 mg/dL (ref 70–99)
Potassium: 4.4 mmol/L (ref 3.5–5.1)
Sodium: 139 mmol/L (ref 135–145)
Total Bilirubin: 0.3 mg/dL (ref 0.0–1.2)
Total Protein: 7.6 g/dL (ref 6.5–8.1)

## 2023-08-13 LAB — CBC WITH DIFFERENTIAL (CANCER CENTER ONLY)
Abs Immature Granulocytes: 0.02 10*3/uL (ref 0.00–0.07)
Basophils Absolute: 0.1 10*3/uL (ref 0.0–0.1)
Basophils Relative: 1 %
Eosinophils Absolute: 0.2 10*3/uL (ref 0.0–0.5)
Eosinophils Relative: 3 %
HCT: 29.7 % — ABNORMAL LOW (ref 39.0–52.0)
Hemoglobin: 9.2 g/dL — ABNORMAL LOW (ref 13.0–17.0)
Immature Granulocytes: 0 %
Lymphocytes Relative: 19 %
Lymphs Abs: 1.5 10*3/uL (ref 0.7–4.0)
MCH: 27.6 pg (ref 26.0–34.0)
MCHC: 31 g/dL (ref 30.0–36.0)
MCV: 89.2 fL (ref 80.0–100.0)
Monocytes Absolute: 1 10*3/uL (ref 0.1–1.0)
Monocytes Relative: 12 %
Neutro Abs: 5.1 10*3/uL (ref 1.7–7.7)
Neutrophils Relative %: 65 %
Platelet Count: 254 10*3/uL (ref 150–400)
RBC: 3.33 MIL/uL — ABNORMAL LOW (ref 4.22–5.81)
RDW: 17.1 % — ABNORMAL HIGH (ref 11.5–15.5)
WBC Count: 7.9 10*3/uL (ref 4.0–10.5)
nRBC: 0 % (ref 0.0–0.2)

## 2023-08-13 MED ORDER — FUROSEMIDE 40 MG PO TABS: ORAL_TABLET | ORAL | 1 refills | Status: DC

## 2023-08-13 MED ORDER — FUROSEMIDE 10 MG/ML IJ SOLN
40.0000 mg | Freq: Once | INTRAMUSCULAR | Status: AC
  Administered 2023-08-13: 40 mg via INTRAVENOUS
  Filled 2023-08-13: qty 4

## 2023-08-13 MED ORDER — DARATUMUMAB-HYALURONIDASE-FIHJ 1800-30000 MG-UT/15ML ~~LOC~~ SOLN
1800.0000 mg | Freq: Once | SUBCUTANEOUS | Status: AC
  Administered 2023-08-13: 1800 mg via SUBCUTANEOUS
  Filled 2023-08-13: qty 15

## 2023-08-13 MED ORDER — DIPHENHYDRAMINE HCL 25 MG PO CAPS
25.0000 mg | ORAL_CAPSULE | Freq: Once | ORAL | Status: AC
  Administered 2023-08-13: 25 mg via ORAL
  Filled 2023-08-13: qty 1

## 2023-08-13 MED ORDER — ACETAMINOPHEN 325 MG PO TABS
650.0000 mg | ORAL_TABLET | Freq: Once | ORAL | Status: AC
Start: 2023-08-13 — End: 2023-08-13
  Administered 2023-08-13: 650 mg via ORAL
  Filled 2023-08-13: qty 2

## 2023-08-13 NOTE — Progress Notes (Signed)
 [name]  Patient Care Team: [name], MD as PCP - General (Family Medicine) [name], MD as PCP - Cardiology (Cardiology)  Assessment & Plan Multiple myeloma without remission [name]) He has IgA kappa multiple myeloma with multiple bone fractures at presentation in 2024.  Bone marrow biopsy showed 15% involvement, standard risk myeloma FISH and cytogenetics  He has good partial response with combination chemotherapy with daratumumab, lenalidomide, bortezomib and dexamethasone His treatment was interrupted in February due to fluid overload and anemia  I shared recent myeloma panel test results with the patient and family I was very surprised to see significant change with worsening M protein, IgA and light chain studies It is possible that his disease got worse due to recent interruption of treatment but I do not expect this degree of abnormalities For now, I plan to continue treatment but I also recommend referral to [facility] for second opinion  He is not receiving Zometa, pending dental clearance He is able to proceed with dental extraction next week as he is on his week off of lenalidomide He will continue dexamethasone every other day except on Fridays, he will take 16 mg dose I will see him again in 7 days for further follow-up He will continue aspirin for DVT prophylaxis He will continue vitamin D supplement and calcium He is on acyclovir for antimicrobial prophylaxis Hypervolemia, unspecified hypervolemia type He has significant fluid retention due to reintroduction of dexamethasone He will continue furosemide 40 mg in the morning, 20 mg in the afternoon and spironolactone daily until he can get his weight down to 220 pounds Cancer associated pain He has recent exacerbation of bone pain due to steroid withdrawal Pain control is improved reintroduction of dexamethasone and MS Contin at night He will  continue oxycodone as needed along with acetaminophen I will reassess pain control in his next visit Orthopnea He continues to sleep on recliner He will continue to use his CPAP machine at night  No orders of the defined types were placed in this encounter.    [name], MD  INTERVAL HISTORY: he returns for treatment follow-up Complications related to previous cycle of chemotherapy included anemia,, cancer associated pain,, and fluid retention We discussed recent myeloma panel and the role for second opinion We discussed dental clearance next week so that we can proceed with Zometa in the future His pain control is stable  PHYSICAL EXAMINATION: ECOG PERFORMANCE STATUS: 1 - Symptomatic but completely ambulatory  Vitals:   08/13/23 0857  BP: (!) 126/45  Pulse: (!) 59  Resp: 18  SpO2: 96%   Filed Weights   08/13/23 0857  Weight: 224 lb 12.8 oz (102 kg)    Relevant data reviewed during this visit included CBC, CMP, myeloma panel and free light chain levels

## 2023-08-13 NOTE — Assessment & Plan Note (Addendum)
 He has recent exacerbation of bone pain due to steroid withdrawal Pain control is improved reintroduction of dexamethasone and MS Contin at night He will continue oxycodone as needed along with acetaminophen I will reassess pain control in his next visit

## 2023-08-13 NOTE — Patient Instructions (Signed)
 [facility]  Discharge Instructions: Thank you for choosing [facility] to provide your oncology and hematology care.   If you have a lab appointment with the Cancer Center, please go directly to the Cancer Center and check in at the registration area.   Wear comfortable clothing and clothing appropriate for easy access to any Portacath or PICC line.   We strive to give you quality time with your provider. You may need to reschedule your appointment if you arrive late (15 or more minutes).  Arriving late affects you and other patients whose appointments are after yours.  Also, if you miss three or more appointments without notifying the office, you may be dismissed from the clinic at the provider's discretion.      For prescription refill requests, have your pharmacy contact our office and allow 72 hours for refills to be completed.    Today you received the following chemotherapy and/or immunotherapy agents: Darzalex faspro   To help prevent nausea and vomiting after your treatment, we encourage you to take your nausea medication as directed.  BELOW ARE SYMPTOMS THAT SHOULD BE REPORTED IMMEDIATELY: *FEVER GREATER THAN 100.4 F (38 C) OR HIGHER *CHILLS OR SWEATING *NAUSEA AND VOMITING THAT IS NOT CONTROLLED WITH YOUR NAUSEA MEDICATION *UNUSUAL SHORTNESS OF BREATH *UNUSUAL BRUISING OR BLEEDING *URINARY PROBLEMS (pain or burning when urinating, or frequent urination) *BOWEL PROBLEMS (unusual diarrhea, constipation, pain near the anus) TENDERNESS IN MOUTH AND THROAT WITH OR WITHOUT PRESENCE OF ULCERS (sore throat, sores in mouth, or a toothache) UNUSUAL RASH, SWELLING OR PAIN  UNUSUAL VAGINAL DISCHARGE OR ITCHING   Items with * indicate a potential emergency and should be followed up as soon as possible or go to the Emergency Department if any problems should occur.  Please show the CHEMOTHERAPY ALERT CARD or  IMMUNOTHERAPY ALERT CARD at check-in to the Emergency Department and triage nurse.  Should you have questions after your visit or need to cancel or reschedule your appointment, please contact [facility]  Dept: [phone]  and follow the prompts.  Office hours are 8:00 a.m. to 4:30 p.m. Monday - Friday. Please note that voicemails left after 4:00 p.m. may not be returned until the following business day.  We are closed weekends and major holidays. You have access to a nurse at all times for urgent questions. Please call the main number to the clinic Dept: [phone] and follow the prompts.   For any non-urgent questions, you may also contact your provider using MyChart. We now offer e-Visits for anyone 7 and older to request care online for non-urgent symptoms. For details visit [email or web address].   Also download the MyChart app! Go to the app store, search "MyChart", open the app, select [facility], and log in with your MyChart username and password.  Furosemide Injection What is this medication? FUROSEMIDE (fyoor [place] se mide) reduces swelling related to heart, kidney, liver, or lung disease. It helps your kidneys remove more fluid and salt from your blood through the urine. It belongs to a group of medications called diuretics. This medicine may be used for other purposes; ask your health care provider or pharmacist if you have questions. What should I tell my care team before I take this medication? They need to know if you have any of these conditions: Diarrhea or vomiting Gout Heart disease High  or low levels of electrolytes, such as magnesium, potassium, or sodium in your blood Kidney disease, small amounts of urine, or difficulty passing urine Liver disease Premature newborn Thyroid disease An unusual or allergic reaction to furosemide, sulfa medications, other medications, foods, dyes, or preservatives Pregnant or  trying to get pregnant Breast-feeding How should I use this medication? This medication is injected into a vein or muscle. It is given in a hospital or clinic setting. Talk to your care team about the use of this medication in children. While it may be prescribed for children as young as newborns for selected conditions, precautions do apply. Overdosage: If you think you have taken too much of this medicine contact a poison control center or emergency room at once. NOTE: This medicine is only for you. Do not share this medicine with others. What if I miss a dose? This does not apply. This medication is not for regular use. What may interact with this medication? Aspirin and aspirin-like medications Certain antibiotics Chloral hydrate Cisplatin Cyclosporine Digoxin Diuretics Laxatives Lithium Medications for blood pressure Medications that relax muscles for surgery Methotrexate NSAIDS, medications for pain and inflammation, such as ibuprofen, naproxen, or indomethacin Phenytoin Steroid medications, such as prednisone or cortisone Thyroid hormones This list may not describe all possible interactions. Give your health care provider a list of all the medicines, herbs, non-prescription drugs, or dietary supplements you use. Also tell them if you smoke, drink alcohol, or use illegal drugs. Some items may interact with your medicine. What should I watch for while using this medication? You will be monitored closely while you are receiving this medication. Visit your care team for regular checks on your progress. Check your blood pressure as directed. Know what your blood pressure should be and when to contact your care team. This medication may increase the amount of sugar in blood or urine. The risk may be higher in patients who already have diabetes. Ask your care team what you can do to lower your risk of diabetes while taking this medication. You may need to be on a special diet while  taking this medication. Check with your care team. Also, ask how many glasses of fluid you need to drink a day. You must not get dehydrated. This medication may affect your coordination, reaction time, or judgment. Do not drive or operate machinery until you know how this medication affects you. Sit up or stand slowly to reduce the risk of dizzy or fainting spells. Drinking alcohol with this medication can increase the risk of these side effects. This medication can make you more sensitive to the sun. Keep out of the sun. If you cannot avoid being in the sun, wear protective clothing and use sunscreen. Do not use sun lamps or tanning beds/booths. Check with your care team if you have severe diarrhea, nausea, and vomiting, or if you sweat a lot. The loss of too much body fluid may make it dangerous for you to take this medication. What side effects may I notice from receiving this medication? Side effects that you should report to your care team as soon as possible: Allergic reactions--skin rash, itching, hives, swelling of the face, lips, tongue, or throat Dehydration--increased thirst, dry mouth, feeling faint or lightheaded, headache, dark yellow or brown urine Hearing loss, ringing in ears High blood sugar (hyperglycemia)--increased thirst or amount of urine, unusual weakness or fatigue, blurry vision Low blood pressure--dizziness, feeling faint or lightheaded, blurry vision Low potassium level--muscle pain or cramps, unusual weakness or  fatigue, fast or irregular heartbeat, constipation Side effects that usually do not require medical attention (report to your care team if they continue or are bothersome): Burning or tingling sensation in hands or feet Constipation Diarrhea Dizziness Headache This list may not describe all possible side effects. Call your doctor for medical advice about side effects. You may report side effects to FDA at 1-800-FDA-1088. Where should I keep my medication? This  medication is given in a hospital or clinic. It will not be stored at home. NOTE: This sheet is a summary. It may not cover all possible information. If you have questions about this medicine, talk to your doctor, pharmacist, or health care provider.  2024 Elsevier/Gold Standard (2021-04-07 00:00:00)

## 2023-08-13 NOTE — Telephone Encounter (Signed)
 Faxed referral to [facility] to [phone] to the attention of Dr. Rosaria Ferries for second opinion. Received fax confirmation.

## 2023-08-13 NOTE — Assessment & Plan Note (Addendum)
 He has IgA kappa multiple myeloma with multiple bone fractures at presentation in 2024.  Bone marrow biopsy showed 15% involvement, standard risk myeloma FISH and cytogenetics  He has good partial response with combination chemotherapy with daratumumab, lenalidomide, bortezomib and dexamethasone His treatment was interrupted in February due to fluid overload and anemia  I shared recent myeloma panel test results with the patient and family I was very surprised to see significant change with worsening M protein, IgA and light chain studies It is possible that his disease got worse due to recent interruption of treatment but I do not expect this degree of abnormalities For now, I plan to continue treatment but I also recommend referral to [facility] for second opinion  He is not receiving Zometa, pending dental clearance He is able to proceed with dental extraction next week as he is on his week off of lenalidomide He will continue dexamethasone every other day except on Fridays, he will take 16 mg dose I will see him again in 7 days for further follow-up He will continue aspirin for DVT prophylaxis He will continue vitamin D supplement and calcium He is on acyclovir for antimicrobial prophylaxis

## 2023-08-13 NOTE — Assessment & Plan Note (Addendum)
 He has significant fluid retention due to reintroduction of dexamethasone He will continue furosemide 40 mg in the morning, 20 mg in the afternoon and spironolactone daily until he can get his weight down to 220 pounds

## 2023-08-13 NOTE — Progress Notes (Signed)
 Patient took his steroid at home this morning. Patient given lasix for his WOB- potassium is 4.4.

## 2023-08-13 NOTE — Assessment & Plan Note (Addendum)
 He continues to sleep on recliner He will continue to use his CPAP machine at night

## 2023-08-16 ENCOUNTER — Other Ambulatory Visit: Payer: Self-pay

## 2023-08-16 ENCOUNTER — Telehealth: Payer: Self-pay

## 2023-08-16 ENCOUNTER — Encounter (HOSPITAL_COMMUNITY): Payer: Self-pay | Admitting: Internal Medicine

## 2023-08-16 ENCOUNTER — Inpatient Hospital Stay (HOSPITAL_COMMUNITY)
Admission: EM | Admit: 2023-08-16 | Discharge: 2023-08-20 | DRG: 871 | Disposition: A | Discharge status: Home Health | Attending: Internal Medicine | Admitting: Internal Medicine

## 2023-08-16 ENCOUNTER — Emergency Department (HOSPITAL_COMMUNITY)

## 2023-08-16 DIAGNOSIS — G2581 Restless legs syndrome: Secondary | ICD-10-CM | POA: Diagnosis present

## 2023-08-16 DIAGNOSIS — E041 Nontoxic single thyroid nodule: Secondary | ICD-10-CM | POA: Diagnosis present

## 2023-08-16 DIAGNOSIS — D849 Immunodeficiency, unspecified: Secondary | ICD-10-CM | POA: Diagnosis present

## 2023-08-16 DIAGNOSIS — M8450XA Pathological fracture in neoplastic disease, unspecified site, initial encounter for fracture: Secondary | ICD-10-CM

## 2023-08-16 DIAGNOSIS — J189 Pneumonia, unspecified organism: Secondary | ICD-10-CM | POA: Diagnosis present

## 2023-08-16 DIAGNOSIS — M85812 Other specified disorders of bone density and structure, left shoulder: Secondary | ICD-10-CM | POA: Diagnosis not present

## 2023-08-16 DIAGNOSIS — J45909 Unspecified asthma, uncomplicated: Secondary | ICD-10-CM | POA: Diagnosis present

## 2023-08-16 DIAGNOSIS — I2489 Other forms of acute ischemic heart disease: Secondary | ICD-10-CM

## 2023-08-16 DIAGNOSIS — M8580 Other specified disorders of bone density and structure, unspecified site: Secondary | ICD-10-CM | POA: Diagnosis present

## 2023-08-16 DIAGNOSIS — C9000 Multiple myeloma not having achieved remission: Secondary | ICD-10-CM | POA: Diagnosis present

## 2023-08-16 DIAGNOSIS — R7989 Other specified abnormal findings of blood chemistry: Secondary | ICD-10-CM | POA: Diagnosis not present

## 2023-08-16 DIAGNOSIS — G629 Polyneuropathy, unspecified: Secondary | ICD-10-CM | POA: Diagnosis present

## 2023-08-16 DIAGNOSIS — J90 Pleural effusion, not elsewhere classified: Secondary | ICD-10-CM | POA: Diagnosis not present

## 2023-08-16 DIAGNOSIS — I2510 Atherosclerotic heart disease of native coronary artery without angina pectoris: Secondary | ICD-10-CM | POA: Diagnosis present

## 2023-08-16 DIAGNOSIS — E876 Hypokalemia: Secondary | ICD-10-CM | POA: Diagnosis present

## 2023-08-16 DIAGNOSIS — Z7982 Long term (current) use of aspirin: Secondary | ICD-10-CM

## 2023-08-16 DIAGNOSIS — Z880 Allergy status to penicillin: Secondary | ICD-10-CM

## 2023-08-16 DIAGNOSIS — R0602 Shortness of breath: Secondary | ICD-10-CM | POA: Diagnosis present

## 2023-08-16 DIAGNOSIS — Z79899 Other long term (current) drug therapy: Secondary | ICD-10-CM

## 2023-08-16 DIAGNOSIS — I10 Essential (primary) hypertension: Secondary | ICD-10-CM | POA: Diagnosis not present

## 2023-08-16 DIAGNOSIS — C9001 Multiple myeloma in remission: Secondary | ICD-10-CM | POA: Diagnosis not present

## 2023-08-16 DIAGNOSIS — D472 Monoclonal gammopathy: Secondary | ICD-10-CM | POA: Diagnosis present

## 2023-08-16 DIAGNOSIS — J4520 Mild intermittent asthma, uncomplicated: Secondary | ICD-10-CM

## 2023-08-16 DIAGNOSIS — J9621 Acute and chronic respiratory failure with hypoxia: Secondary | ICD-10-CM | POA: Diagnosis present

## 2023-08-16 DIAGNOSIS — Z9981 Dependence on supplemental oxygen: Secondary | ICD-10-CM | POA: Diagnosis not present

## 2023-08-16 DIAGNOSIS — D638 Anemia in other chronic diseases classified elsewhere: Secondary | ICD-10-CM | POA: Diagnosis present

## 2023-08-16 DIAGNOSIS — E8581 Light chain (AL) amyloidosis: Secondary | ICD-10-CM | POA: Diagnosis present

## 2023-08-16 DIAGNOSIS — E781 Pure hyperglyceridemia: Secondary | ICD-10-CM | POA: Diagnosis present

## 2023-08-16 DIAGNOSIS — R509 Fever, unspecified: Secondary | ICD-10-CM | POA: Diagnosis not present

## 2023-08-16 DIAGNOSIS — J9611 Chronic respiratory failure with hypoxia: Secondary | ICD-10-CM | POA: Diagnosis not present

## 2023-08-16 DIAGNOSIS — I77819 Aortic ectasia, unspecified site: Secondary | ICD-10-CM | POA: Diagnosis present

## 2023-08-16 DIAGNOSIS — M8458XA Pathological fracture in neoplastic disease, other specified site, initial encounter for fracture: Secondary | ICD-10-CM | POA: Diagnosis present

## 2023-08-16 DIAGNOSIS — M1990 Unspecified osteoarthritis, unspecified site: Secondary | ICD-10-CM | POA: Diagnosis present

## 2023-08-16 DIAGNOSIS — J984 Other disorders of lung: Secondary | ICD-10-CM | POA: Diagnosis not present

## 2023-08-16 DIAGNOSIS — K219 Gastro-esophageal reflux disease without esophagitis: Secondary | ICD-10-CM | POA: Diagnosis present

## 2023-08-16 DIAGNOSIS — I110 Hypertensive heart disease with heart failure: Secondary | ICD-10-CM | POA: Diagnosis present

## 2023-08-16 DIAGNOSIS — Z955 Presence of coronary angioplasty implant and graft: Secondary | ICD-10-CM

## 2023-08-16 DIAGNOSIS — Z8249 Family history of ischemic heart disease and other diseases of the circulatory system: Secondary | ICD-10-CM

## 2023-08-16 DIAGNOSIS — R6520 Severe sepsis without septic shock: Secondary | ICD-10-CM | POA: Diagnosis present

## 2023-08-16 DIAGNOSIS — Z87891 Personal history of nicotine dependence: Secondary | ICD-10-CM

## 2023-08-16 DIAGNOSIS — L89312 Pressure ulcer of right buttock, stage 2: Secondary | ICD-10-CM | POA: Diagnosis present

## 2023-08-16 DIAGNOSIS — C7951 Secondary malignant neoplasm of bone: Secondary | ICD-10-CM

## 2023-08-16 DIAGNOSIS — I493 Ventricular premature depolarization: Secondary | ICD-10-CM | POA: Diagnosis present

## 2023-08-16 DIAGNOSIS — M7592 Shoulder lesion, unspecified, left shoulder: Secondary | ICD-10-CM | POA: Diagnosis not present

## 2023-08-16 DIAGNOSIS — Z79891 Long term (current) use of opiate analgesic: Secondary | ICD-10-CM

## 2023-08-16 DIAGNOSIS — H409 Unspecified glaucoma: Secondary | ICD-10-CM | POA: Diagnosis present

## 2023-08-16 DIAGNOSIS — M25512 Pain in left shoulder: Secondary | ICD-10-CM | POA: Diagnosis not present

## 2023-08-16 DIAGNOSIS — R918 Other nonspecific abnormal finding of lung field: Secondary | ICD-10-CM | POA: Diagnosis not present

## 2023-08-16 DIAGNOSIS — R008 Other abnormalities of heart beat: Secondary | ICD-10-CM | POA: Diagnosis present

## 2023-08-16 DIAGNOSIS — I21A1 Myocardial infarction type 2: Secondary | ICD-10-CM | POA: Diagnosis present

## 2023-08-16 DIAGNOSIS — Z7902 Long term (current) use of antithrombotics/antiplatelets: Secondary | ICD-10-CM

## 2023-08-16 DIAGNOSIS — I4891 Unspecified atrial fibrillation: Secondary | ICD-10-CM | POA: Diagnosis present

## 2023-08-16 DIAGNOSIS — Z856 Personal history of leukemia: Secondary | ICD-10-CM

## 2023-08-16 DIAGNOSIS — L89322 Pressure ulcer of left buttock, stage 2: Secondary | ICD-10-CM | POA: Diagnosis present

## 2023-08-16 DIAGNOSIS — A419 Sepsis, unspecified organism: Principal | ICD-10-CM

## 2023-08-16 DIAGNOSIS — G893 Neoplasm related pain (acute) (chronic): Secondary | ICD-10-CM | POA: Diagnosis not present

## 2023-08-16 DIAGNOSIS — Z7901 Long term (current) use of anticoagulants: Secondary | ICD-10-CM

## 2023-08-16 DIAGNOSIS — G4733 Obstructive sleep apnea (adult) (pediatric): Secondary | ICD-10-CM | POA: Diagnosis not present

## 2023-08-16 DIAGNOSIS — N529 Male erectile dysfunction, unspecified: Secondary | ICD-10-CM | POA: Diagnosis present

## 2023-08-16 DIAGNOSIS — M19012 Primary osteoarthritis, left shoulder: Secondary | ICD-10-CM | POA: Diagnosis not present

## 2023-08-16 DIAGNOSIS — I5033 Acute on chronic diastolic (congestive) heart failure: Secondary | ICD-10-CM | POA: Diagnosis present

## 2023-08-16 DIAGNOSIS — Z7952 Long term (current) use of systemic steroids: Secondary | ICD-10-CM

## 2023-08-16 DIAGNOSIS — Z888 Allergy status to other drugs, medicaments and biological substances status: Secondary | ICD-10-CM

## 2023-08-16 DIAGNOSIS — R001 Bradycardia, unspecified: Secondary | ICD-10-CM | POA: Diagnosis not present

## 2023-08-16 LAB — CBC
HCT: 33 % — ABNORMAL LOW (ref 39.0–52.0)
Hemoglobin: 10.4 g/dL — ABNORMAL LOW (ref 13.0–17.0)
MCH: 28.2 pg (ref 26.0–34.0)
MCHC: 31.5 g/dL (ref 30.0–36.0)
MCV: 89.4 fL (ref 80.0–100.0)
Platelets: 268 10*3/uL (ref 150–400)
RBC: 3.69 MIL/uL — ABNORMAL LOW (ref 4.22–5.81)
RDW: 17.6 % — ABNORMAL HIGH (ref 11.5–15.5)
WBC: 8.7 10*3/uL (ref 4.0–10.5)
nRBC: 0 % (ref 0.0–0.2)

## 2023-08-16 LAB — URINALYSIS, W/ REFLEX TO CULTURE (INFECTION SUSPECTED)
Bacteria, UA: NONE SEEN
Bilirubin Urine: NEGATIVE
Glucose, UA: NEGATIVE mg/dL
Ketones, ur: NEGATIVE mg/dL
Leukocytes,Ua: NEGATIVE
Nitrite: NEGATIVE
Protein, ur: NEGATIVE mg/dL
Specific Gravity, Urine: 1.015 (ref 1.005–1.030)
pH: 5 (ref 5.0–8.0)

## 2023-08-16 LAB — I-STAT CG4 LACTIC ACID, ED
Lactic Acid, Venous: 1.8 mmol/L (ref 0.5–1.9)
Lactic Acid, Venous: 1.3 mmol/L (ref 0.5–1.9)

## 2023-08-16 LAB — COMPREHENSIVE METABOLIC PANEL
ALT: 18 U/L (ref 0–44)
AST: 23 U/L (ref 15–41)
Albumin: 2.9 g/dL — ABNORMAL LOW (ref 3.5–5.0)
Alkaline Phosphatase: 93 U/L (ref 38–126)
Anion gap: 14 (ref 5–15)
BUN: 38 mg/dL — ABNORMAL HIGH (ref 8–23)
CO2: 21 mmol/L — ABNORMAL LOW (ref 22–32)
Calcium: 9.4 mg/dL (ref 8.9–10.3)
Chloride: 103 mmol/L (ref 98–111)
Creatinine, Ser: 0.92 mg/dL (ref 0.61–1.24)
GFR, Estimated: >60 mL/min (ref >60)
Glucose, Bld: 178 mg/dL — ABNORMAL HIGH (ref 70–99)
Potassium: 3.9 mmol/L (ref 3.5–5.1)
Sodium: 138 mmol/L (ref 135–145)
Total Bilirubin: 0.8 mg/dL (ref 0.0–1.2)
Total Protein: 8 g/dL (ref 6.5–8.1)

## 2023-08-16 LAB — MAGNESIUM: Magnesium: 1.9 mg/dL (ref 1.7–2.4)

## 2023-08-16 LAB — RESP PANEL BY RT-PCR (RSV, FLU A&B, COVID)  RVPGX2
Influenza A by PCR: NEGATIVE
Influenza B by PCR: NEGATIVE
Resp Syncytial Virus by PCR: NEGATIVE
SARS Coronavirus 2 by RT PCR: NEGATIVE

## 2023-08-16 LAB — STREP PNEUMONIAE URINARY ANTIGEN: Strep Pneumo Urinary Antigen: NEGATIVE

## 2023-08-16 LAB — PROTIME-INR
INR: 1.2 (ref 0.8–1.2)
Prothrombin Time: 15.8 s — ABNORMAL HIGH (ref 11.4–15.2)

## 2023-08-16 LAB — APTT: aPTT: 30 s (ref 24–36)

## 2023-08-16 LAB — TROPONIN I (HIGH SENSITIVITY)
Troponin I (High Sensitivity): 11 ng/L (ref <18)
Troponin I (High Sensitivity): 25 ng/L — ABNORMAL HIGH (ref <18)

## 2023-08-16 LAB — PROCALCITONIN: Procalcitonin: 0.4 ng/mL

## 2023-08-16 LAB — MRSA NEXT GEN BY PCR, NASAL: MRSA by PCR Next Gen: NOT DETECTED

## 2023-08-16 MED ORDER — LACTATED RINGERS IV BOLUS
1000.0000 mL | INTRAVENOUS | Status: AC
  Administered 2023-08-16: 1000 mL via INTRAVENOUS

## 2023-08-16 MED ORDER — ALBUMIN HUMAN 25 % IV SOLN
25.0000 g | Freq: Once | INTRAVENOUS | Status: AC
  Administered 2023-08-17: 25 g via INTRAVENOUS
  Filled 2023-08-16: qty 100

## 2023-08-16 MED ORDER — CARVEDILOL 3.125 MG PO TABS
3.1250 mg | ORAL_TABLET | Freq: Two times a day (BID) | ORAL | Status: DC
  Administered 2023-08-16 – 2023-08-20 (×5): 3.125 mg via ORAL
  Filled 2023-08-16 (×7): qty 1

## 2023-08-16 MED ORDER — SODIUM CHLORIDE 0.9 % IV SOLN
2.0000 g | Freq: Once | INTRAVENOUS | Status: AC
  Administered 2023-08-16: 2 g via INTRAVENOUS
  Filled 2023-08-16: qty 20

## 2023-08-16 MED ORDER — ORAL CARE MOUTH RINSE: 15.0000 mL | OROMUCOSAL | Status: DC | PRN

## 2023-08-16 MED ORDER — AMIODARONE HCL IN DEXTROSE 360-4.14 MG/200ML-% IV SOLN
60.0000 mg/h | INTRAVENOUS | Status: DC
  Administered 2023-08-16 (×2): 60 mg/h via INTRAVENOUS
  Filled 2023-08-16 (×2): qty 200

## 2023-08-16 MED ORDER — SODIUM CHLORIDE 0.9 % IV BOLUS
500.0000 mL | Freq: Once | INTRAVENOUS | Status: AC
  Administered 2023-08-16: 500 mL via INTRAVENOUS

## 2023-08-16 MED ORDER — ACYCLOVIR 400 MG PO TABS
400.0000 mg | ORAL_TABLET | Freq: Every day | ORAL | Status: DC
  Administered 2023-08-17 – 2023-08-20 (×4): 400 mg via ORAL
  Filled 2023-08-16 (×4): qty 1

## 2023-08-16 MED ORDER — PREGABALIN 25 MG PO CAPS
100.0000 mg | ORAL_CAPSULE | Freq: Two times a day (BID) | ORAL | Status: DC
  Administered 2023-08-16 – 2023-08-17 (×3): 100 mg via ORAL
  Filled 2023-08-16 (×3): qty 1

## 2023-08-16 MED ORDER — CHLORHEXIDINE GLUCONATE CLOTH 2 % EX PADS
6.0000 | MEDICATED_PAD | Freq: Every day | CUTANEOUS | Status: DC
  Administered 2023-08-16: 6 via TOPICAL

## 2023-08-16 MED ORDER — SODIUM CHLORIDE 0.9% FLUSH
3.0000 mL | Freq: Two times a day (BID) | INTRAVENOUS | Status: DC
  Administered 2023-08-16 – 2023-08-20 (×7): 3 mL via INTRAVENOUS

## 2023-08-16 MED ORDER — HEPARIN (PORCINE) 25000 UT/250ML-% IV SOLN
1800.0000 [IU]/h | INTRAVENOUS | Status: DC
Start: 2023-08-16 — End: 2023-08-17
  Administered 2023-08-16: 1500 [IU]/h via INTRAVENOUS
  Administered 2023-08-17: 1800 [IU]/h via INTRAVENOUS
  Filled 2023-08-16 (×2): qty 250

## 2023-08-16 MED ORDER — OXYCODONE HCL 5 MG PO TABS: 5.0000 mg | ORAL_TABLET | Freq: Four times a day (QID) | ORAL | Status: DC | PRN

## 2023-08-16 MED ORDER — METRONIDAZOLE 500 MG/100ML IV SOLN
500.0000 mg | Freq: Once | INTRAVENOUS | Status: AC
  Administered 2023-08-16: 500 mg via INTRAVENOUS
  Filled 2023-08-16: qty 100

## 2023-08-16 MED ORDER — PANTOPRAZOLE SODIUM 40 MG PO TBEC: 40.0000 mg | DELAYED_RELEASE_TABLET | Freq: Every day | ORAL | Status: DC

## 2023-08-16 MED ORDER — DEXAMETHASONE 4 MG PO TABS
4.0000 mg | ORAL_TABLET | ORAL | Status: DC
  Filled 2023-08-16: qty 1

## 2023-08-16 MED ORDER — OYSTER SHELL CALCIUM/D3 500-5 MG-MCG PO TABS
1.0000 | ORAL_TABLET | Freq: Two times a day (BID) | ORAL | Status: DC
  Administered 2023-08-17 – 2023-08-20 (×7): 1 via ORAL
  Filled 2023-08-16 (×7): qty 1

## 2023-08-16 MED ORDER — AMIODARONE HCL IN DEXTROSE 360-4.14 MG/200ML-% IV SOLN: 30.0000 mg/h | INTRAVENOUS | Status: DC

## 2023-08-16 MED ORDER — ADULT MULTIVITAMIN W/MINERALS CH
1.0000 | ORAL_TABLET | Freq: Every day | ORAL | Status: DC
  Administered 2023-08-17 – 2023-08-20 (×4): 1 via ORAL
  Filled 2023-08-16 (×4): qty 1

## 2023-08-16 MED ORDER — ACETAMINOPHEN 325 MG PO TABS
650.0000 mg | ORAL_TABLET | Freq: Four times a day (QID) | ORAL | Status: DC | PRN
  Administered 2023-08-16 – 2023-08-19 (×9): 650 mg via ORAL
  Filled 2023-08-16 (×9): qty 2

## 2023-08-16 MED ORDER — DOCUSATE SODIUM 100 MG PO CAPS
100.0000 mg | ORAL_CAPSULE | Freq: Two times a day (BID) | ORAL | Status: DC
  Administered 2023-08-16 – 2023-08-20 (×8): 100 mg via ORAL
  Filled 2023-08-16 (×8): qty 1

## 2023-08-16 MED ORDER — BRIMONIDINE TARTRATE 0.2 % OP SOLN
1.0000 [drp] | Freq: Two times a day (BID) | OPHTHALMIC | Status: AC
  Administered 2023-08-17 (×2): 1 [drp] via OPHTHALMIC
  Filled 2023-08-16: qty 5

## 2023-08-16 MED ORDER — SODIUM CHLORIDE 0.9% FLUSH: 3.0000 mL | INTRAVENOUS | Status: DC | PRN

## 2023-08-16 MED ORDER — DEXAMETHASONE 4 MG PO TABS: 16.0000 mg | ORAL_TABLET | Freq: Every day | ORAL | Status: DC

## 2023-08-16 MED ORDER — CLOPIDOGREL BISULFATE 75 MG PO TABS
75.0000 mg | ORAL_TABLET | Freq: Every day | ORAL | Status: DC
Start: 2023-08-17 — End: 2023-08-17

## 2023-08-16 MED ORDER — HEPARIN BOLUS VIA INFUSION
4000.0000 [IU] | Freq: Once | INTRAVENOUS | Status: AC
  Administered 2023-08-16: 4000 [IU] via INTRAVENOUS
  Filled 2023-08-16: qty 4000

## 2023-08-16 MED ORDER — ROSUVASTATIN CALCIUM 20 MG PO TABS: 40.0000 mg | ORAL_TABLET | Freq: Every day | ORAL | Status: DC

## 2023-08-16 MED ORDER — TRIAMCINOLONE ACETONIDE 55 MCG/ACT NA AERO
2.0000 | INHALATION_SPRAY | Freq: Every day | NASAL | Status: DC
  Administered 2023-08-17 – 2023-08-20 (×4): 2 via NASAL
  Filled 2023-08-16: qty 10.8

## 2023-08-16 MED ORDER — IPRATROPIUM BROMIDE 0.02 % IN SOLN: 0.5000 mg | RESPIRATORY_TRACT | Status: DC | PRN

## 2023-08-16 MED ORDER — PANTOPRAZOLE SODIUM 40 MG PO TBEC
40.0000 mg | DELAYED_RELEASE_TABLET | Freq: Every day | ORAL | Status: DC
  Administered 2023-08-17 – 2023-08-20 (×4): 40 mg via ORAL
  Filled 2023-08-16 (×4): qty 1

## 2023-08-16 MED ORDER — SENNOSIDES-DOCUSATE SODIUM 8.6-50 MG PO TABS
2.0000 | ORAL_TABLET | Freq: Two times a day (BID) | ORAL | Status: DC
  Administered 2023-08-16 – 2023-08-20 (×8): 2 via ORAL
  Filled 2023-08-16 (×8): qty 2

## 2023-08-16 MED ORDER — MORPHINE SULFATE ER 15 MG PO TBCR
15.0000 mg | EXTENDED_RELEASE_TABLET | Freq: Every day | ORAL | Status: DC
  Administered 2023-08-16 – 2023-08-19 (×4): 15 mg via ORAL
  Filled 2023-08-16 (×4): qty 1

## 2023-08-16 MED ORDER — ALUM & MAG HYDROXIDE-SIMETH 200-200-20 MG/5ML PO SUSP
30.0000 mL | ORAL | Status: DC | PRN
  Administered 2023-08-17 (×2): 30 mL via ORAL
  Filled 2023-08-16 (×2): qty 30

## 2023-08-16 MED ORDER — SODIUM CHLORIDE 0.9 % IV SOLN
2.0000 g | Freq: Three times a day (TID) | INTRAVENOUS | Status: DC
  Administered 2023-08-16 – 2023-08-20 (×12): 2 g via INTRAVENOUS
  Filled 2023-08-16 (×12): qty 12.5

## 2023-08-16 MED ORDER — PROCHLORPERAZINE EDISYLATE 10 MG/2ML IJ SOLN: 10.0000 mg | Freq: Four times a day (QID) | INTRAMUSCULAR | Status: DC | PRN

## 2023-08-16 MED ORDER — SODIUM CHLORIDE 0.9 % IV SOLN: 250.0000 mL | INTRAVENOUS | Status: AC | PRN

## 2023-08-16 MED ORDER — AMIODARONE LOAD VIA INFUSION
150.0000 mg | Freq: Once | INTRAVENOUS | Status: AC
  Administered 2023-08-16: 150 mg via INTRAVENOUS
  Filled 2023-08-16: qty 83.34

## 2023-08-16 MED ORDER — VANCOMYCIN HCL 1750 MG/350ML IV SOLN
1750.0000 mg | Freq: Every day | INTRAVENOUS | Status: DC
  Administered 2023-08-17: 1750 mg via INTRAVENOUS
  Filled 2023-08-16 (×3): qty 350

## 2023-08-16 MED ORDER — DORZOLAMIDE HCL-TIMOLOL MAL 2-0.5 % OP SOLN
1.0000 [drp] | Freq: Two times a day (BID) | OPHTHALMIC | Status: DC
  Administered 2023-08-16 – 2023-08-20 (×8): 1 [drp] via OPHTHALMIC
  Filled 2023-08-16: qty 10

## 2023-08-16 MED ORDER — VANCOMYCIN HCL IN DEXTROSE 1-5 GM/200ML-% IV SOLN: 1000.0000 mg | Freq: Once | INTRAVENOUS | Status: DC

## 2023-08-16 MED ORDER — LATANOPROST 0.005 % OP SOLN
1.0000 [drp] | Freq: Every day | OPHTHALMIC | Status: DC
  Administered 2023-08-16 – 2023-08-19 (×4): 1 [drp] via OPHTHALMIC
  Filled 2023-08-16: qty 2.5

## 2023-08-16 MED ORDER — LEVALBUTEROL HCL 0.63 MG/3ML IN NEBU
0.6300 mg | INHALATION_SOLUTION | Freq: Four times a day (QID) | RESPIRATORY_TRACT | Status: DC
  Administered 2023-08-17 (×4): 0.63 mg via RESPIRATORY_TRACT
  Filled 2023-08-16 (×4): qty 3

## 2023-08-16 MED ORDER — MORPHINE SULFATE (PF) 2 MG/ML IV SOLN
2.0000 mg | Freq: Once | INTRAVENOUS | Status: AC
  Administered 2023-08-16: 2 mg via INTRAVENOUS
  Filled 2023-08-16: qty 1

## 2023-08-16 MED ORDER — SODIUM CHLORIDE 0.9 % IV SOLN: 2.0000 g | Freq: Once | INTRAVENOUS | Status: DC

## 2023-08-16 MED ORDER — POLYETHYLENE GLYCOL 3350 17 G PO PACK
17.0000 g | PACK | Freq: Two times a day (BID) | ORAL | Status: DC
  Administered 2023-08-16 – 2023-08-20 (×4): 17 g via ORAL
  Filled 2023-08-16 (×8): qty 1

## 2023-08-16 MED ORDER — LORATADINE 10 MG PO TABS: 10.0000 mg | ORAL_TABLET | Freq: Every day | ORAL | Status: DC

## 2023-08-16 MED ORDER — ACETAMINOPHEN 650 MG RE SUPP: 650.0000 mg | Freq: Four times a day (QID) | RECTAL | Status: DC | PRN

## 2023-08-16 MED ORDER — VANCOMYCIN HCL 2000 MG/400ML IV SOLN
2000.0000 mg | Freq: Once | INTRAVENOUS | Status: AC
  Administered 2023-08-16: 2000 mg via INTRAVENOUS
  Filled 2023-08-16: qty 400

## 2023-08-16 MED ORDER — OXYCODONE HCL 5 MG PO TABS
5.0000 mg | ORAL_TABLET | ORAL | Status: DC | PRN
  Administered 2023-08-16 – 2023-08-17 (×4): 10 mg via ORAL
  Filled 2023-08-16 (×4): qty 2

## 2023-08-16 MED ORDER — LACTATED RINGERS IV SOLN: INTRAVENOUS | Status: AC

## 2023-08-16 NOTE — ED Notes (Signed)
 ED TO INPATIENT HANDOFF REPORT  Name/Age/Gender Daniel Whitaker 78 y.o. adult  Code Status    Code Status Orders  (From admission, onward)           Start     Ordered   08/16/23 2044  Full code  Continuous       Question:  By:  Answer:  Consent: discussion documented in EHR   08/16/23 2044           Code Status History     Date Active Date Inactive Code Status Order ID Comments User Context   07/23/2023 1449 07/27/2023 1833 Full Code [id number]  Bobette Mo, MD Inpatient   05/03/2023 1547 05/07/2023 2225 Full Code [id number]  Nolberto Hanlon, MD Inpatient   04/27/2023 0930 04/28/2023 0512 Full Code [id number]  Roanna Banning, MD HOV   10/22/2021 1140 10/22/2021 2205 Full Code [id number]  Marykay Lex, MD Inpatient   04/15/2013 1959 04/25/2013 1325 Full Code [id number]  Joseph Art, DO ED       Home/SNF/Other Home  Chief Complaint Atrial fibrillation with RVR (HCC) [I48.91]  Level of Care/Admitting Diagnosis ED Disposition     ED Disposition  Admit   Condition  --   Comment  Hospital Area: [facility] [[id number]]  Level of Care: Stepdown [14]  Admit to SDU based on following criteria: Other see comments  Comments: A-fib RVR on amiodarone drip  May admit patient to [facility] or [facility] if equivalent level of care is available:: No  Covid Evaluation: Confirmed COVID Negative  Diagnosis: Atrial fibrillation with RVR [facility]) [[id number]]  Admitting Physician: Tereasa Coop [[id number]]  Attending Physician: Tereasa Coop [[id number]]  Certification:: I certify this patient will need inpatient services for at least 2 midnights  Expected Medical Readiness: 08/21/2023          Medical History Past Medical History:  Diagnosis Date   Arthritis    Cataract    removed bilaterally    Coronary artery disease    Erectile dysfunction    Esophageal reflux    Glaucoma    Hypertriglyceridemia    Impaired fasting glucose    MGUS (monoclonal  gammopathy of unknown significance)    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   Nocturnal leg cramps 03/11/2021   Nontoxic uninodular goiter    Obesity    Peripheral neuropathy    RLS (restless legs syndrome)    Spinal stenosis    Unspecified deficiency anemia 07/11/2013    Allergies Allergies  Allergen Reactions   Atorvastatin Other (See Comments)    Made the hands ACHE   Cymbalta [Duloxetine Hcl] Nausea Only and Other (See Comments)    Drowsiness, also   Penicillins Hives    IV Location/Drains/Wounds Patient Lines/Drains/Airways Status     Active Line/Drains/Airways     Name Placement date Placement time Site Days   Peripheral IV 08/16/23 20 G Anterior;Right Forearm 08/16/23  1800  Forearm  less than 1   Peripheral IV 08/16/23 20 G Left;Posterior Hand 08/16/23  1801  Hand  less than 1   Peripheral IV 08/16/23 20 G 1" Anterior;Right;Upper Arm 08/16/23  1950  Arm  less than 1            Labs/Imaging Results for orders placed or performed during the hospital encounter of 08/16/23 (from the past 48 hours)  CBC     Status: Abnormal   Collection Time: 08/16/23  5:57 PM  Result Value Ref Range   WBC  8.7 4.0 - 10.5 K/uL   RBC 3.69 (L) 4.22 - 5.81 MIL/uL   Hemoglobin 10.4 (L) 13.0 - 17.0 g/dL   HCT 82.9 (L) 56.2 - 13.0 %   MCV 89.4 80.0 - 100.0 fL   MCH 28.2 26.0 - 34.0 pg   MCHC 31.5 30.0 - 36.0 g/dL   RDW 86.5 (H) 78.4 - 69.6 %   Platelets 268 150 - 400 K/uL   nRBC 0.0 0.0 - 0.2 %    Comment: Performed at [facility], [address] [place], [place] [address]  Troponin I (High Sensitivity)     Status: None   Collection Time: 08/16/23  5:57 PM  Result Value Ref Range   Troponin I (High Sensitivity) 11 <18 ng/L    Comment: (NOTE) Elevated high sensitivity troponin I (hsTnI) values and significant  changes across serial measurements may suggest ACS but many other  chronic and acute conditions are known to elevate hsTnI results.   Refer to the "Links" section for chest pain algorithms and additional  guidance. Performed at [facility], [address], [place], [place] [address]   Comprehensive metabolic panel     Status: Abnormal   Collection Time: 08/16/23  5:57 PM  Result Value Ref Range   Sodium 138 135 - 145 mmol/L   Potassium 3.9 3.5 - 5.1 mmol/L   Chloride 103 98 - 111 mmol/L   CO2 21 (L) 22 - 32 mmol/L   Glucose, Bld 178 (H) 70 - 99 mg/dL    Comment: Glucose reference range applies only to samples taken after fasting for at least 8 hours.   BUN 38 (H) 8 - 23 mg/dL   Creatinine, Ser 4.01 0.61 - 1.24 mg/dL   Calcium 9.4 8.9 - 02.7 mg/dL   Total Protein 8.0 6.5 - 8.1 g/dL   Albumin 2.9 (L) 3.5 - 5.0 g/dL   AST 23 15 - 41 U/L   ALT 18 0 - 44 U/L   Alkaline Phosphatase 93 38 - 126 U/L   Total Bilirubin 0.8 0.0 - 1.2 mg/dL   GFR, Estimated >25 >36 mL/min    Comment: (NOTE) Calculated using the CKD-EPI Creatinine Equation (2021)    Anion gap 14 5 - 15    Comment: Performed at [facility], [address], [place], [place] [address]  Protime-INR     Status: Abnormal   Collection Time: 08/16/23  5:57 PM  Result Value Ref Range   Prothrombin Time 15.8 (H) 11.4 - 15.2 seconds   INR 1.2 0.8 - 1.2    Comment: (NOTE) INR goal varies based on device and disease states. Performed at [facility], [address] [place], [place] [address]   APTT     Status: None   Collection Time: 08/16/23  5:57 PM  Result Value Ref Range   aPTT 30 24 - 36 seconds    Comment: Performed at [facility], [address], [place], [place] [address]  Blood Culture (routine x 2)     Status: None (Preliminary result)   Collection Time: 08/16/23  5:57 PM   Specimen: BLOOD LEFT HAND  Result Value Ref Range   Specimen Description      BLOOD LEFT HAND Performed at [facility], [address], [place], [place] [address]    Special Requests       BOTTLES DRAWN AEROBIC AND ANAEROBIC Blood Culture adequate volume Performed at [facility],  [address], [place], [place] [address]    Culture PENDING    Report Status PENDING   Magnesium     Status: None   Collection Time: 08/16/23  5:57 PM  Result Value Ref Range   Magnesium 1.9 1.7 - 2.4 mg/dL    Comment: Performed at [facility], [address] [place], [place] [address]  I-Stat Lactic Acid, ED     Status: None   Collection Time: 08/16/23  6:16 PM  Result Value Ref Range   Lactic Acid, Venous 1.8 0.5 - 1.9 mmol/L  Resp panel by RT-PCR (RSV, Flu A&B, Covid) Anterior Nasal Swab     Status: None   Collection Time: 08/16/23  6:27 PM   Specimen: Anterior Nasal Swab  Result Value Ref Range   SARS Coronavirus 2 by RT PCR NEGATIVE NEGATIVE    Comment: (NOTE) SARS-CoV-2 target nucleic acids are NOT DETECTED.  The SARS-CoV-2 RNA is generally detectable in upper respiratory specimens during the acute phase of infection. The lowest concentration of SARS-CoV-2 viral copies this assay can detect is 138 copies/mL. A negative result does not preclude SARS-Cov-2 infection and should not be used as the sole basis for treatment or other patient management decisions. A negative result may occur with  improper specimen collection/handling, submission of specimen other than nasopharyngeal swab, presence of viral mutation(s) within the areas targeted by this assay, and inadequate number of viral copies(<138 copies/mL). A negative result must be combined with clinical observations, patient history, and epidemiological information. The expected result is Negative.  Fact Sheet for Patients:  BloggerCourse.com  Fact Sheet for Healthcare Providers:  SeriousBroker.it  This test is no t yet approved or cleared by the [place] FDA and  has been authorized for detection and/or diagnosis of SARS-CoV-2  by FDA under an Emergency Use Authorization (EUA). This EUA will remain  in effect (meaning this test can be used) for the duration of the COVID-19 declaration under Section 564(b)(1) of the Act, 21 U.S.C.section 360bbb-3(b)(1), unless the authorization is terminated  or revoked sooner.       Influenza A by PCR NEGATIVE NEGATIVE   Influenza B by PCR NEGATIVE NEGATIVE    Comment: (NOTE) The Xpert Xpress SARS-CoV-2/FLU/RSV plus assay is intended as an aid in the diagnosis of influenza from Nasopharyngeal swab specimens and should not be used as a sole basis for treatment. Nasal washings and aspirates are unacceptable for Xpert Xpress SARS-CoV-2/FLU/RSV testing.  Fact Sheet for Patients: BloggerCourse.com  Fact Sheet for Healthcare Providers: SeriousBroker.it  This test is not yet approved or cleared by the [place] FDA and has been authorized for detection and/or diagnosis of SARS-CoV-2 by FDA under an Emergency Use Authorization (EUA). This EUA will remain in effect (meaning this test can be used) for the duration of the COVID-19 declaration under Section 564(b)(1) of the Act, 21 U.S.C. section 360bbb-3(b)(1), unless the authorization is terminated or revoked.     Resp Syncytial Virus by PCR NEGATIVE NEGATIVE    Comment: (NOTE) Fact Sheet for Patients: BloggerCourse.com  Fact Sheet for Healthcare Providers: SeriousBroker.it  This test is not yet approved or cleared by the [place] FDA and has been authorized for detection and/or diagnosis of SARS-CoV-2 by FDA under an Emergency Use Authorization (EUA). This EUA will remain in effect (meaning this test can be used) for the duration of the COVID-19 declaration under Section 564(b)(1) of the Act, 21 U.S.C. section 360bbb-3(b)(1), unless the authorization is terminated or revoked.  Performed at [facility], [address], [place], [place] [address]   Urinalysis, w/ Reflex to Culture (Infection Suspected) -Urine, Clean Catch     Status: Abnormal   Collection Time: 08/16/23  8:03 PM  Result Value Ref Range   Specimen Source URINE, CLEAN CATCH    Color, Urine YELLOW YELLOW   APPearance CLEAR CLEAR   Specific Gravity, Urine 1.015 1.005 - 1.030   pH 5.0 5.0 - 8.0   Glucose, UA NEGATIVE NEGATIVE mg/dL   Hgb urine dipstick SMALL (A) NEGATIVE   Bilirubin Urine NEGATIVE NEGATIVE   Ketones, ur NEGATIVE NEGATIVE mg/dL   Protein, ur NEGATIVE NEGATIVE mg/dL   Nitrite NEGATIVE NEGATIVE   Leukocytes,Ua NEGATIVE NEGATIVE   RBC / HPF 0-5 0 - 5 RBC/hpf   WBC, UA 0-5 0 - 5 WBC/hpf    Comment:        Reflex urine culture not performed if WBC <=10, OR if Squamous epithelial cells >5. If Squamous epithelial cells >5 suggest recollection.    Bacteria, UA NONE SEEN NONE SEEN   Squamous Epithelial / HPF 0-5 0 - 5 /HPF   Mucus PRESENT    Hyaline Casts, UA PRESENT     Comment: Performed at [facility], [address], [place], [place] [address]  Troponin I (High Sensitivity)     Status: Abnormal   Collection Time: 08/16/23  8:42 PM  Result Value Ref Range   Troponin I (High Sensitivity) 25 (H) <18 ng/L    Comment: (NOTE) Elevated high sensitivity troponin I (hsTnI) values and significant  changes across serial measurements may suggest ACS but many other  chronic and acute conditions are known to elevate hsTnI results.  Refer to the "Links" section for chest pain algorithms and additional  guidance. Performed at [facility], [address] [place], [place] [address]   I-Stat Lactic Acid, ED     Status: None   Collection Time: 08/16/23  8:49 PM  Result Value Ref Range   Lactic Acid, Venous 1.3 0.5 - 1.9 mmol/L   DG Chest Port 1 View Result Date: 08/16/2023 CLINICAL DATA:  Shortness of breath and fever. Current chemotherapy. History of multiple  myeloma previous exam. EXAM: PORTABLE CHEST 1 VIEW COMPARISON:  07/25/2023, 07/26/2023. FINDINGS: The heart is enlarged and mediastinal contours is stable. Lung volumes are low with patchy airspace disease at the lung bases, increased from the prior exam. There are small bilateral pleural effusions. No pneumothorax is seen. Multiple lytic lesions are seen within the bones. Bilateral rib fractures are noted and unchanged from the previous exam. IMPRESSION: 1. Increased patchy airspace disease at the lung bases, possible atelectasis or infiltrate. 2. Small bilateral pleural effusions. 3. Stable lytic lesions throughout the bones with bilateral rib fractures and compatible with history of multiple myeloma. Electronically Signed   By: Thornell Sartorius M.D.   On: 08/16/2023 19:29    Pending Labs Unresulted Labs (From admission, onward)     Start     Ordered   08/17/23 0500  CBC  Daily,   R      08/16/23 1900   08/17/23 0500  Comprehensive metabolic panel  Tomorrow morning,   R        08/16/23 2044   08/17/23 0400  Heparin level (unfractionated)  Once-Timed,   URGENT        08/16/23 1900   08/17/23 0000  Lactic acid, plasma  (Lactic Acid)  STAT Now then every 3  hours,   R      08/16/23 2046   08/16/23 2057  MRSA Next Gen by PCR, Nasal  Once,   R        08/16/23 2056   08/16/23 2055  Procalcitonin  Add-on,   AD       References:    Procalcitonin Lower Respiratory Tract Infection AND Sepsis Procalcitonin Algorithm   08/16/23 2054   08/16/23 2045  Expectorated Sputum Assessment w Gram Stain, Rflx to Resp Cult  (COPD / Pneumonia / Cellulitis / Lower Extremity Wound (Diabetic Foot Infection))  Once,   R        08/16/23 2044   08/16/23 2045  Strep pneumoniae urinary antigen  (COPD / Pneumonia / Cellulitis / Lower Extremity Wound (Diabetic Foot Infection))  Once,   R        08/16/23 2044   08/16/23 2045  Legionella Pneumophila Serogp 1 Ur Ag  (COPD / Pneumonia / Cellulitis / Lower Extremity Wound (Diabetic  Foot Infection))  Once,   R        08/16/23 2044   08/16/23 1748  Blood Culture (routine x 2)  (Septic presentation on arrival (screening labs, nursing and treatment orders for obvious sepsis))  BLOOD CULTURE X 2,   STAT      08/16/23 1750            Vitals/Pain Today's Vitals   08/16/23 1800 08/16/23 1900 08/16/23 2000 08/16/23 2029  BP:  112/63 (!) 114/59 117/66  Pulse:  (!) 148 85 88  Resp:  (!) 23 19 (!) 21  Temp: (!) 102.3 F (39.1 C)     TempSrc: Rectal     SpO2:  94% 95% 96%  Weight:      Height:      PainSc:    8     Isolation Precautions No active isolations  Medications Medications  vancomycin (VANCOREADY) IVPB 2000 mg/400 mL (2,000 mg Intravenous New Bag/Given 08/16/23 2041)  amiodarone (NEXTERONE) 1.8 mg/mL load via infusion 150 mg (150 mg Intravenous Bolus from Bag 08/16/23 1934)    Followed by  amiodarone (NEXTERONE PREMIX) 360-4.14 MG/200ML-% (1.8 mg/mL) IV infusion (60 mg/hr Intravenous New Bag/Given 08/16/23 1933)    Followed by  amiodarone (NEXTERONE PREMIX) 360-4.14 MG/200ML-% (1.8 mg/mL) IV infusion (has no administration in time range)  heparin ADULT infusion 100 units/mL (25000 units/272mL) (1,500 Units/hr Intravenous New Bag/Given 08/16/23 1951)  morphine (MS CONTIN) 12 hr tablet 15 mg (has no administration in time range)  acyclovir (ZOVIRAX) tablet 400 mg (has no administration in time range)  carvedilol (COREG) tablet 3.125 mg (has no administration in time range)  rosuvastatin (CRESTOR) tablet 40 mg (has no administration in time range)  prochlorperazine (COMPAZINE) injection 10 mg (has no administration in time range)  docusate sodium (COLACE) capsule 100 mg (has no administration in time range)  polyethylene glycol (MIRALAX / GLYCOLAX) packet 17 g (has no administration in time range)  senna-docusate (Senokot-S) tablet 2 tablet (has no administration in time range)  pregabalin (LYRICA) capsule 100 mg (has no administration in time range)   triamcinolone (NASACORT) nasal inhaler 2 spray (has no administration in time range)  loratadine (CLARITIN) tablet 10 mg (has no administration in time range)  levalbuterol (XOPENEX) nebulizer solution 0.63 mg (has no administration in time range)  dorzolamide-timolol (COSOPT) 2-0.5 % ophthalmic solution 1 drop (has no administration in time range)  latanoprost (XALATAN) 0.005 % ophthalmic solution 1 drop (has no administration in time range)  lactated  ringers infusion (has no administration in time range)  sodium chloride flush (NS) 0.9 % injection 3 mL (has no administration in time range)  sodium chloride flush (NS) 0.9 % injection 3 mL (has no administration in time range)  sodium chloride flush (NS) 0.9 % injection 3 mL (has no administration in time range)  0.9 %  sodium chloride infusion (has no administration in time range)  acetaminophen (TYLENOL) tablet 650 mg (has no administration in time range)    Or  acetaminophen (TYLENOL) suppository 650 mg (has no administration in time range)  Centrum Silver 50+Men TABS 1 tablet (has no administration in time range)  clopidogrel (PLAVIX) tablet 75 mg (has no administration in time range)  pantoprazole (PROTONIX) EC tablet 40 mg (has no administration in time range)  vancomycin (VANCOREADY) IVPB 1750 mg/350 mL (has no administration in time range)  ceFEPIme (MAXIPIME) 2 g in sodium chloride 0.9 % 100 mL IVPB (has no administration in time range)  ipratropium (ATROVENT) nebulizer solution 0.5 mg (has no administration in time range)  oxyCODONE (Oxy IR/ROXICODONE) immediate release tablet 5 mg (has no administration in time range)  sodium chloride 0.9 % bolus 500 mL (0 mLs Intravenous Stopped 08/16/23 1853)  metroNIDAZOLE (FLAGYL) IVPB 500 mg (0 mg Intravenous Stopped 08/16/23 2030)  cefTRIAXone (ROCEPHIN) 2 g in sodium chloride 0.9 % 100 mL IVPB (0 g Intravenous Stopped 08/16/23 1848)  heparin bolus via infusion 4,000 Units (4,000 Units  Intravenous Bolus from Bag 08/16/23 1951)  morphine (PF) 2 MG/ML injection 2 mg (2 mg Intravenous Given 08/16/23 2030)    Mobility walks with person assist

## 2023-08-16 NOTE — Telephone Encounter (Signed)
 Returned call to [name] and left a message. Daniel Whitaker spiked a fever over 103, and is confused. They have called 911 and they just wanted Dr. Bertis Ruddy to be aware.

## 2023-08-16 NOTE — ED Triage Notes (Signed)
 Pt arrived via POV. C/o SOB needing to be on 4L [place] at home, fever over 138f.  Chemo pt, last treatment 3x days ago

## 2023-08-16 NOTE — Sepsis Progress Note (Signed)
 Code Sepsis protocol being monitored by eLink.

## 2023-08-16 NOTE — Progress Notes (Signed)
 PHARMACY - ANTICOAGULATION CONSULT NOTE  Pharmacy Consult for IV heparin Indication: atrial fibrillation  Allergies  Allergen Reactions   Atorvastatin Other (See Comments)    Made the hands ACHE   Cymbalta [Duloxetine Hcl] Nausea Only and Other (See Comments)    Drowsiness, also   Penicillins Hives    Patient Measurements: Height: 6' (182.9 cm) Weight: 101 kg (222 lb 10.6 oz) IBW/kg (Calculated) : 77.6 Heparin Dosing Weight: 98 kg  Vital Signs: Temp: 102.3 F (39.1 C) (03/17 1800) Temp Source: Rectal (03/17 1800) BP: 152/94 (03/17 1740) Pulse Rate: 47 (03/17 1740)  Labs: Recent Labs    08/16/23 1757  HGB 10.4*  HCT 33.0*  PLT 268  APTT 30  LABPROT 15.8*  INR 1.2  CREATININE 0.92  TROPONINIHS 11    Estimated Creatinine Clearance (by C-G formula based on SCr of 0.92 mg/dL) Male: 40.9 mL/min Male: 82.7 mL/min   Medical History: 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 Noted to be in atrial fibrillation in ED. Pharmacy consulted to dose IV heparin  Baseline INR, aPTT: WNL Prior anticoagulation: none  Significant events:  Today, 08/16/2023: CBC: Hgb slightly low (c/w malignancy & recent chemo) SCr < 1.0 No  bleeding or infusion issues per nursing  Goal of Therapy: Heparin level 0.3-0.7 units/ml Monitor platelets by anticoagulation protocol: Yes  Plan: Heparin 4000 units IV bolus x 1 Heparin 1500 units/hr IV infusion Daily CBC, daily heparin level once stable Monitor for signs of bleeding or thrombosis F/u plans (if any) for long-term anticoagulation  Bernadene Person, PharmD, BCPS [phone] 08/16/2023, 7:03 PM

## 2023-08-16 NOTE — ED Provider Notes (Signed)
 [place] EMERGENCY DEPARTMENT AT [facility] Provider Note   CSN: [id number] Arrival date & time: 08/16/23  1715     History  Chief Complaint  Patient presents with   Fever   Shortness of Breath    Daniel Whitaker is a 78 y.o. adult.  With a history of multiple myeloma currently on chemotherapy, CAD, and obstructive sleep apnea presents to the ED for concern of fever.  Patient is currently on chemotherapy with his last treatment 3 days ago.  Has had increasing O2 requirement up to 4 L at home nasal cannula.  Also began to experience fevers earlier today.  Does have some left-sided chest pain as well.  No prior history of atrial fibrillation.  No anticoagulation.   Fever Shortness of Breath Associated symptoms: fever        Home Medications Prior to Admission medications   Medication Sig Start Date End Date Taking? Authorizing Provider  acyclovir (ZOVIRAX) 400 MG tablet Take 1 tablet (400 mg total) by mouth daily. 06/09/23   [name], MD  albuterol (VENTOLIN HFA) 108 (90 Base) MCG/ACT inhaler Inhale 1-2 puffs into the lungs every 6 (six) hours as needed for wheezing or shortness of breath.    [provider]  aspirin EC 325 MG tablet Take 1 tablet (325 mg total) by mouth daily. 06/18/23   [name], MD  brimonidine (ALPHAGAN) 0.2 % ophthalmic solution Place 1 drop into the left eye in the morning and at bedtime. 01/08/20   [provider]  carvedilol (COREG) 3.125 MG tablet TAKE 1 TABLET BY MOUTH TWICE DAILY 05/24/23   [name], MD  cetirizine (ZYRTEC) 10 MG tablet Take 10 mg by mouth every morning.    [provider]  cyclobenzaprine (FLEXERIL) 10 MG tablet Take 10 mg by mouth daily as needed for muscle spasms.    [provider]  DENTA 5000 PLUS 1.1 % CREA dental cream Place 1 Application onto teeth at bedtime. 06/08/23   [provider]  dexamethasone (DECADRON) 4 MG tablet Take 1 pill daily except 4 pills every  Friday 08/04/23   [name], MD  docusate sodium (COLACE) 100 MG capsule Take 100 mg by mouth 2 (two) times daily.    [provider]  dorzolamide-timolol (COSOPT) 22.3-6.8 MG/ML ophthalmic solution Place 1 drop into both eyes 2 (two) times daily. 03/09/18   [provider]  furosemide (LASIX) 40 MG tablet Take 40 mg in the morning and 20 mg in the afternoon 08/13/23   [name], MD  ketoconazole (NIZORAL) 2 % cream Apply 1 application  topically daily as needed for irritation. 11/28/14   [provider]  latanoprost (XALATAN) 0.005 % ophthalmic solution Place 1 drop into both eyes at bedtime. 03/01/13   [provider]  lenalidomide (REVLIMID) 10 MG capsule Take 1 capsule (10 mg total) by mouth daily. Take 1 capsule daily for 14 days on and 7 days off, repeat every 21 days. [employer] Auth #   [id number] Date Obtained 07/29/23 07/29/23   [name], MD  lidocaine (XYLOCAINE) 5 % ointment Apply 1 Application topically 2 (two) times daily as needed. Patient taking differently: Apply 1 Application topically daily as needed for mild pain (pain score 1-3) or moderate pain (pain score 4-6) (for foot pain). 09/29/22   Butch Penny, NP  morphine (MS CONTIN) 15 MG 12 hr tablet Take 15 mg by mouth at bedtime.    [provider]  Multiple Vitamins-Minerals (CENTRUM SILVER  50+MEN) TABS Take 1 tablet by mouth daily with breakfast.    [provider]  nitroGLYCERIN (NITROSTAT) 0.4 MG SL tablet Place 1 tablet (0.4 mg total) under the tongue every 5 (five) minutes as needed. Patient taking differently: Place 0.4 mg under the tongue every 5 (five) minutes as needed for chest pain. 10/22/21   Arty Baumgartner, NP  ondansetron (ZOFRAN) 8 MG tablet Take 1 tablet (8 mg total) by mouth every 8 (eight) hours as needed for nausea or vomiting. 05/11/23   [name], MD  Oxycodone HCl 10 MG TABS Take 1 tablet (10 mg total) by mouth every 3 (three) hours as needed. 08/04/23    [name], MD  pantoprazole (PROTONIX) 40 MG tablet Take 40 mg by mouth daily before breakfast. 03/14/13   [provider]  polyethylene glycol (MIRALAX / GLYCOLAX) 17 g packet Take 17 g by mouth 2 (two) times daily. 07/30/23   [name], MD  pregabalin (LYRICA) 100 MG capsule TAKE 1 CAPSULE BY MOUTH IN THE MORNING, AND AT NOON, AND 2 CAPSULES AT NIGHT Patient taking differently: Take 100 mg by mouth in the morning and at bedtime. 02/17/23   Ocie Doyne, MD  prochlorperazine (COMPAZINE) 10 MG tablet Take 1 tablet (10 mg total) by mouth every 6 (six) hours as needed for nausea or vomiting. 05/11/23   [name], MD  rosuvastatin (CRESTOR) 40 MG tablet TAKE 1 TABLET BY MOUTH DAILY 08/09/23   [name], MD  senna-docusate (SENOKOT-S) 8.6-50 MG tablet Take 2 tablets by mouth 2 (two) times daily. 07/30/23   [name], MD  spironolactone (ALDACTONE) 25 MG tablet Take 1 tablet (25 mg total) by mouth in the morning. 07/21/23   [name], MD  triamcinolone (NASACORT) 55 MCG/ACT AERO nasal inhaler Place 2 sprays into the nose daily. Patient taking differently: Place 1 spray into the nose daily. 06/03/23   [name], MD  TYLENOL 500 MG tablet Take 1,000 mg by mouth every 6 (six) hours as needed for mild pain (pain score 1-3) or headache.    [provider]      Allergies    Atorvastatin, Cymbalta [duloxetine hcl], and Penicillins    Review of Systems   Review of Systems  Constitutional:  Positive for fever.  Respiratory:  Positive for shortness of breath.     Physical Exam Updated Vital Signs BP 117/66 (BP Location: Left Arm)   Pulse 88   Temp (!) 102.3 F (39.1 C) (Rectal)   Resp (!) 21   Ht 6' (1.829 m)   Wt 101 kg   SpO2 96%   BMI 30.20 kg/m  Physical Exam Vitals and nursing note reviewed.  HENT:     Head: Normocephalic and atraumatic.  Eyes:     Pupils: Pupils are equal, round, and reactive to light.  Cardiovascular:     Rate and Rhythm: Tachycardia  present. Rhythm irregular.  Pulmonary:     Effort: Pulmonary effort is normal.     Breath sounds: Normal breath sounds.  Abdominal:     Palpations: Abdomen is soft.     Tenderness: There is no abdominal tenderness.  Skin:    General: Skin is warm and dry.  Neurological:     Mental Status: He is alert.  Psychiatric:        Mood and Affect: Mood normal.     ED Results / Procedures / Treatments   Labs (all labs ordered are listed, but only abnormal results are displayed) Labs  Reviewed  CBC - Abnormal; Notable for the following components:      Result Value   RBC 3.69 (*)    Hemoglobin 10.4 (*)    HCT 33.0 (*)    RDW 17.6 (*)    All other components within normal limits  COMPREHENSIVE METABOLIC PANEL - Abnormal; Notable for the following components:   CO2 21 (*)    Glucose, Bld 178 (*)    BUN 38 (*)    Albumin 2.9 (*)    All other components within normal limits  PROTIME-INR - Abnormal; Notable for the following components:   Prothrombin Time 15.8 (*)    All other components within normal limits  URINALYSIS, W/ REFLEX TO CULTURE (INFECTION SUSPECTED) - Abnormal; Notable for the following components:   Hgb urine dipstick SMALL (*)    All other components within normal limits  RESP PANEL BY RT-PCR (RSV, FLU A&B, COVID)  RVPGX2  CULTURE, BLOOD (ROUTINE X 2)  CULTURE, BLOOD (ROUTINE X 2)  APTT  MAGNESIUM  CBC  HEPARIN LEVEL (UNFRACTIONATED)  I-STAT CG4 LACTIC ACID, ED  I-STAT CG4 LACTIC ACID, ED  TROPONIN I (HIGH SENSITIVITY)  TROPONIN I (HIGH SENSITIVITY)    EKG EKG Interpretation Date/Time:  Monday August 16 2023 20:15:57 EDT Ventricular Rate:  88 PR Interval:  156 QRS Duration:  106 QT Interval:  360 QTC Calculation: 436 R Axis:   -10  Text Interpretation: Sinus rhythm Supraventricular bigeminy Abnormal R-wave progression, early transition Left ventricular hypertrophy Confirmed by Estelle June [phone]) on 08/16/2023 8:41:08 PM  Radiology DG Chest Port 1  View Result Date: 08/16/2023 CLINICAL DATA:  Shortness of breath and fever. Current chemotherapy. History of multiple myeloma previous exam. EXAM: PORTABLE CHEST 1 VIEW COMPARISON:  07/25/2023, 07/26/2023. FINDINGS: The heart is enlarged and mediastinal contours is stable. Lung volumes are low with patchy airspace disease at the lung bases, increased from the prior exam. There are small bilateral pleural effusions. No pneumothorax is seen. Multiple lytic lesions are seen within the bones. Bilateral rib fractures are noted and unchanged from the previous exam. IMPRESSION: 1. Increased patchy airspace disease at the lung bases, possible atelectasis or infiltrate. 2. Small bilateral pleural effusions. 3. Stable lytic lesions throughout the bones with bilateral rib fractures and compatible with history of multiple myeloma. Electronically Signed   By: Thornell Sartorius M.D.   On: 08/16/2023 19:29    Procedures .Critical Care  Performed by: Royanne Foots, DO Authorized by: Royanne Foots, DO   Critical care provider statement:    Critical care time (minutes):  75   Critical care was necessary to treat or prevent imminent or life-threatening deterioration of the following conditions:  Cardiac failure and sepsis   Critical care was time spent personally by me on the following activities:  Development of treatment plan with patient or surrogate, discussions with consultants, evaluation of patient's response to treatment, examination of patient, ordering and review of laboratory studies, ordering and review of radiographic studies, ordering and performing treatments and interventions, pulse oximetry, re-evaluation of patient's condition and review of old charts   I assumed direction of critical care for this patient from another provider in my specialty: no     Care discussed with: admitting provider   Comments:     Care discussed with cardiology on-call and hospitalist service     Medications Ordered in  ED Medications  vancomycin (VANCOREADY) IVPB 2000 mg/400 mL (2,000 mg Intravenous New Bag/Given 08/16/23 2041)  amiodarone (NEXTERONE) 1.8 mg/mL  load via infusion 150 mg (150 mg Intravenous Bolus from Bag 08/16/23 1934)    Followed by  amiodarone (NEXTERONE PREMIX) 360-4.14 MG/200ML-% (1.8 mg/mL) IV infusion (60 mg/hr Intravenous New Bag/Given 08/16/23 1933)    Followed by  amiodarone (NEXTERONE PREMIX) 360-4.14 MG/200ML-% (1.8 mg/mL) IV infusion (has no administration in time range)  heparin ADULT infusion 100 units/mL (25000 units/264mL) (1,500 Units/hr Intravenous New Bag/Given 08/16/23 1951)  sodium chloride 0.9 % bolus 500 mL (0 mLs Intravenous Stopped 08/16/23 1853)  metroNIDAZOLE (FLAGYL) IVPB 500 mg (0 mg Intravenous Stopped 08/16/23 2030)  cefTRIAXone (ROCEPHIN) 2 g in sodium chloride 0.9 % 100 mL IVPB (0 g Intravenous Stopped 08/16/23 1848)  heparin bolus via infusion 4,000 Units (4,000 Units Intravenous Bolus from Bag 08/16/23 1951)  morphine (PF) 2 MG/ML injection 2 mg (2 mg Intravenous Given 08/16/23 2030)    ED Course/ Medical Decision Making/ A&P Clinical Course as of 08/16/23 2041  Mon Aug 16, 2023  1840 Reassessed patient's heart rate and hemodynamic status after IV fluid bolus.  No improvement in rate.  Still in A-fib up to 160s 170s.  Remains normotensive.  Discussed patient with: Cardiology on-call Dr.Chandrasekhar who recommends amiodarone bolus and drip along with heparin bolus and drip.  Cardiology will round on patient tomorrow morning [MP]  2023 Reassessed patient's heart rate and hemodynamic status.  Now in normal sinus rhythm in the 80s.  Normotensive.  Amiodarone and heparin drip running.  Will admit to medicine [MP]  2039 Discussed with admitting hospitalist accept patient for admission.  Possible infiltrates on x-ray concerning for potential pneumonia. [MP]    Clinical Course User Index [MP] Royanne Foots, DO                                 Medical Decision  Making 78 year old male with history as above presenting given concern for acute infection.  Currently undergoing chemotherapy for multiple myeloma.  Last treatment 3 days ago.  Now with fevers tachycardia.  Initial EKG shows A-fib with RVR.  No history of A-fib.  Suspect underlying infection as cause of new onset A-fib.  Will obtain infectious workup including cultures and lactate along with high sensitive troponin chest x-ray.  He is slightly hypertensive here.  No indication for immediate cardioversion at this time.  Will trial gentle IV fluid bolus to see if his heart rate response to that.  Should he become hemodynamically unstable we will attempt cardioversion.  He remains on telemetry with pads in place.  Amount and/or Complexity of Data Reviewed Labs: ordered. Radiology: ordered.  Risk Prescription drug management. Decision regarding hospitalization.           Final Clinical Impression(s) / ED Diagnoses Final diagnoses:  Atrial fibrillation with RVR (HCC)  Multiple myeloma, remission status unspecified (HCC)  Fever, unspecified fever cause    Rx / DC Orders ED Discharge Orders     None         Royanne Foots, DO 08/16/23 2041

## 2023-08-16 NOTE — H&P (Addendum)
 History and Physical    Daniel Whitaker LKG:[id number] DOB: 1945-09-21 DOA: 08/16/2023  PCP: [name], MD   Patient coming from: Home   Chief Complaint:  Chief Complaint  Patient presents with   Fever   Shortness of Breath   ED TRIAGE note:  Pt arrived via POV. C/o SOB needing to be on 4L [place] at home, fever over 130f.   Chemo pt, last treatment 3x days ago            HPI:  Daniel Whitaker is a 78 y.o. 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  Reported chemo treatment 3 days ago. Patient ported currently on chemo treatment 3 days ago and recently has been increasing oxygen requirement currently using 4 L at home.  Patient is complaining about generalized chest pain.  Reporting nonproductive cough and shortness of breath, less fatigue and weakness. Denies any palpitation, headache, blurry vision, abdominal pain, constipation, diarrhea, change of weight and appetite.  Family at the bedside and patient's son is over phone have been updated.   ED Course:  At presentation to ED patient is febrile, tachycardic and bradycardic heart rate between 47-148, tachypneic 24, borderline hypotensive blood pressure 114/59 and O2 sat 95% room air. Initial EKG showing A-fib RVR heart rate 147. EKG showing normal sinus rhythm heart rate 88 supraventricular bigeminy. CBC no evidence of leukocytosis stable H&H. Troponin within normal range. CMP unremarkable except low bicarb 21 and chronically elevated BUN 38.  Low albumin  28. Normal INR.  Elevated pro time.  Normal APTT.  Blood cultures are in process. Mag level 1.9. Lactic acid 1.8 WNL. Respiratory panel negative. Chest x-ray showing increased patchy airspace disease lung base possible atelectasis/infiltrate.  Small bilateral pleural effusion. Stable Naitik bilateral rib fracture compatible with history of multiple myeloma.  Given patient has atrial fibrillation with flutter in ED patient has been started on amiodarone drip and heparin drip with pharmacy consult.  Also being treated with metronidazole and vancomycin.  Received 500 mL of NS bolus. In the ED code sepsis has been activated in the setting of hypotension.  ED physician consulted and spoke with on-call cardiology Dr. Antionette Fairy who recommended to start IV heparin drip and amiodarone and cardiology team will see patient in the morning.  Hospitalist  has been consulted for further evaluation management of A-fib RVR and treatment of early development of sepsis secondary to pneumonia  Significant labs in the ED: Lab Orders         Resp panel by RT-PCR (RSV, Flu A&B, Covid) Anterior Nasal Swab         Blood Culture (routine x 2)         Expectorated Sputum Assessment w Gram Stain, Rflx to Resp Cult         MRSA Next Gen by PCR, Nasal         CBC         Comprehensive metabolic panel         Protime-INR         APTT         Urinalysis, w/ Reflex to Culture (Infection Suspected) -Urine, Clean Catch  Magnesium         CBC         Heparin level (unfractionated)         Strep pneumoniae urinary antigen         Legionella Pneumophila Serogp 1 Ur Ag         Comprehensive metabolic panel         Lactic acid, plasma         Procalcitonin         I-Stat Lactic Acid, ED       Review of Systems:  Review of Systems  Constitutional:  Positive for chills, fever and weight loss.  Respiratory:  Positive for cough and shortness of breath. Negative for sputum production and wheezing.    Cardiovascular:  Negative for chest pain, palpitations and leg swelling.  Gastrointestinal:  Negative for heartburn, nausea and vomiting.  Musculoskeletal:  Negative for neck pain.  Neurological:  Negative for dizziness and headaches.  Psychiatric/Behavioral:  The patient is not nervous/anxious.   All other systems reviewed and are negative.   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   Nocturnal leg cramps 03/11/2021   Nontoxic uninodular goiter    Obesity    Peripheral neuropathy    RLS (restless legs syndrome)    Spinal stenosis    Unspecified deficiency anemia 07/11/2013    Past Surgical History:  Procedure Laterality Date   CARDIAC CATHETERIZATION     COLON RESECTION N/A 04/21/2013   Procedure: DIAGNOSTIC LAPAROSCOPY, lysis of adhesions for partial small bowel obstruction;  Surgeon: Adolph Pollack, MD;  Location: [facility];  Service: General;  Laterality: N/A;   COLON SURGERY     partial SBO    COLONOSCOPY     CORONARY STENT INTERVENTION N/A 10/22/2021   Procedure: CORONARY STENT INTERVENTION;  Surgeon: Marykay Lex, MD;  Location: [facility] INVASIVE CV LAB;  Service: Cardiovascular;  Laterality: N/A;   KNEE SURGERY     Arthroscopic   LEFT HEART CATH AND CORONARY ANGIOGRAPHY N/A 10/22/2021   Procedure: LEFT HEART CATH AND CORONARY ANGIOGRAPHY;  Surgeon: Marykay Lex, MD;  Location: [facility] INVASIVE CV LAB;  Service: Cardiovascular;  Laterality: N/A;   SMALL INTESTINE SURGERY     Blockage     reports that he quit smoking about 39 years ago. His smoking use included cigarettes. He has never used smokeless tobacco. He reports that he does not currently use alcohol after a past usage of  about 10.0 standard drinks of alcohol per week. He reports that he does not use drugs.  Allergies  Allergen Reactions   Atorvastatin Other (See Comments)    Made the hands ACHE   Cymbalta [Duloxetine Hcl] Nausea Only and Other (See Comments)    Drowsiness, also   Penicillins Hives    Family History  Problem Relation Age of Onset   Brain cancer Mother    Lung cancer Father    Heart disease Brother        No details   Dementia Brother    Neuropathy Neg Hx    Colon cancer Neg Hx    Colon polyps Neg Hx  Esophageal cancer Neg Hx    Rectal cancer Neg Hx    Stomach cancer Neg Hx    Sleep apnea Neg Hx     Prior to Admission medications   Medication Sig Start Date End Date Taking? Authorizing Provider  acyclovir (ZOVIRAX) 400 MG tablet Take 1 tablet (400 mg total) by mouth daily. 06/09/23   [name], MD  albuterol (VENTOLIN HFA) 108 (90 Base) MCG/ACT inhaler Inhale 1-2 puffs into the lungs every 6 (six) hours as needed for wheezing or shortness of breath.    [provider]  aspirin EC 325 MG tablet Take 1 tablet (325 mg total) by mouth daily. 06/18/23   [name], MD  brimonidine (ALPHAGAN) 0.2 % ophthalmic solution Place 1 drop into the left eye in the morning and at bedtime. 01/08/20   [provider]  carvedilol (COREG) 3.125 MG tablet TAKE 1 TABLET BY MOUTH TWICE DAILY 05/24/23   [name], MD  cetirizine (ZYRTEC) 10 MG tablet Take 10 mg by mouth every morning.    [provider]  cyclobenzaprine (FLEXERIL) 10 MG tablet Take 10 mg by mouth daily as needed for muscle spasms.    [provider]  DENTA 5000 PLUS 1.1 % CREA dental cream Place 1 Application onto teeth at bedtime. 06/08/23   [provider]  dexamethasone (DECADRON) 4 MG tablet Take 1 pill daily except 4 pills every Friday 08/04/23   [name], MD  docusate sodium (COLACE) 100 MG capsule Take 100 mg by mouth 2 (two) times daily.    [provider]   dorzolamide-timolol (COSOPT) 22.3-6.8 MG/ML ophthalmic solution Place 1 drop into both eyes 2 (two) times daily. 03/09/18   [provider]  furosemide (LASIX) 40 MG tablet Take 40 mg in the morning and 20 mg in the afternoon 08/13/23   [name], MD  ketoconazole (NIZORAL) 2 % cream Apply 1 application  topically daily as needed for irritation. 11/28/14   [provider]  latanoprost (XALATAN) 0.005 % ophthalmic solution Place 1 drop into both eyes at bedtime. 03/01/13   [provider]  lenalidomide (REVLIMID) 10 MG capsule Take 1 capsule (10 mg total) by mouth daily. Take 1 capsule daily for 14 days on and 7 days off, repeat every 21 days. [employer] Auth #   [id number] Date Obtained 07/29/23 07/29/23   [name], MD  lidocaine (XYLOCAINE) 5 % ointment Apply 1 Application topically 2 (two) times daily as needed. Patient taking differently: Apply 1 Application topically daily as needed for mild pain (pain score 1-3) or moderate pain (pain score 4-6) (for foot pain). 09/29/22   Butch Penny, NP  morphine (MS CONTIN) 15 MG 12 hr tablet Take 15 mg by mouth at bedtime.    [provider]  Multiple Vitamins-Minerals (CENTRUM SILVER 50+MEN) TABS Take 1 tablet by mouth daily with breakfast.    [provider]  nitroGLYCERIN (NITROSTAT) 0.4 MG SL tablet Place 1 tablet (0.4 mg total) under the tongue every 5 (five) minutes as needed. Patient taking differently: Place 0.4 mg under the tongue every 5 (five) minutes as needed for chest pain. 10/22/21   Arty Baumgartner, NP  ondansetron (ZOFRAN) 8 MG tablet Take 1 tablet (8 mg total) by mouth every 8 (eight) hours as needed for nausea or vomiting. 05/11/23   [name], MD  Oxycodone HCl 10 MG TABS Take 1 tablet (10 mg total) by mouth every 3 (three) hours as needed. 08/04/23   [name],  MD  pantoprazole (PROTONIX) 40 MG tablet Take 40 mg by mouth daily before breakfast. 03/14/13   [provider]   polyethylene glycol (MIRALAX / GLYCOLAX) 17 g packet Take 17 g by mouth 2 (two) times daily. 07/30/23   [name], MD  pregabalin (LYRICA) 100 MG capsule TAKE 1 CAPSULE BY MOUTH IN THE MORNING, AND AT NOON, AND 2 CAPSULES AT NIGHT Patient taking differently: Take 100 mg by mouth in the morning and at bedtime. 02/17/23   Ocie Doyne, MD  prochlorperazine (COMPAZINE) 10 MG tablet Take 1 tablet (10 mg total) by mouth every 6 (six) hours as needed for nausea or vomiting. 05/11/23   [name], MD  rosuvastatin (CRESTOR) 40 MG tablet TAKE 1 TABLET BY MOUTH DAILY 08/09/23   [name], MD  senna-docusate (SENOKOT-S) 8.6-50 MG tablet Take 2 tablets by mouth 2 (two) times daily. 07/30/23   [name], MD  spironolactone (ALDACTONE) 25 MG tablet Take 1 tablet (25 mg total) by mouth in the morning. 07/21/23   [name], MD  triamcinolone (NASACORT) 55 MCG/ACT AERO nasal inhaler Place 2 sprays into the nose daily. Patient taking differently: Place 1 spray into the nose daily. 06/03/23   [name], MD  TYLENOL 500 MG tablet Take 1,000 mg by mouth every 6 (six) hours as needed for mild pain (pain score 1-3) or headache.    [provider]     Physical Exam: Vitals:   08/16/23 2000 08/16/23 2029 08/16/23 2120 08/16/23 2122  BP: (!) 114/59 117/66 (!) 106/55   Pulse: 85 88 73   Resp: 19 (!) 21 20   Temp:    97.8 F (36.6 C)  TempSrc:    Oral  SpO2: 95% 96% 96%   Weight:      Height:        Physical Exam Vitals and nursing note reviewed.  Constitutional:      Appearance: He is ill-appearing.  Eyes:     Pupils: Pupils are equal, round, and reactive to light.  Cardiovascular:     Rate and Rhythm: Normal rate and regular rhythm.  Pulmonary:     Effort: Pulmonary effort is normal.     Breath sounds: Normal breath sounds. No decreased breath sounds, wheezing, rhonchi or rales.  Chest:     Chest wall: Tenderness present. No deformity or edema.  Abdominal:     General: Bowel  sounds are normal.     Palpations: Abdomen is soft.  Musculoskeletal:     Right lower leg: No edema.     Left lower leg: No edema.  Skin:    General: Skin is dry.     Capillary Refill: Capillary refill takes less than 2 seconds.  Neurological:     Mental Status: He is alert and oriented to person, place, and time.      Labs on Admission: I have personally reviewed following labs and imaging studies  CBC: Recent Labs  Lab 08/13/23 0839 08/16/23 1757  WBC 7.9 8.7  NEUTROABS 5.1  --   HGB 9.2* 10.4*  HCT 29.7* 33.0*  MCV 89.2 89.4  PLT 254 268   Basic Metabolic Panel: Recent Labs  Lab 08/13/23 0839 08/16/23 1757  NA 139 138  K 4.4 3.9  CL 105 103  CO2 28 21*  GLUCOSE 99 178*  BUN 30* 38*  CREATININE 0.74 0.92  CALCIUM 9.5 9.4  MG  --  1.9   GFR: Estimated Creatinine Clearance (by C-G formula based on SCr  of 0.92 mg/dL) Male: 84.1 mL/min Male: 82.7 mL/min Liver Function Tests: Recent Labs  Lab 08/13/23 0839 08/16/23 1757  AST 10* 23  ALT 16 18  ALKPHOS 107 93  BILITOT 0.3 0.8  PROT 7.6 8.0  ALBUMIN 3.3* 2.9*   No results for input(s): "LIPASE", "AMYLASE" in the last 168 hours. No results for input(s): "AMMONIA" in the last 168 hours. Coagulation Profile: Recent Labs  Lab 08/16/23 1757  INR 1.2   Cardiac Enzymes: Recent Labs  Lab 08/16/23 1757 08/16/23 2042  TROPONINIHS 11 25*   BNP (last 3 results) Recent Labs    07/19/23 1226 07/23/23 1016 07/25/23 1133  BNP 160.9* 116.3* 210.9*   HbA1C: No results for input(s): "HGBA1C" in the last 72 hours. CBG: No results for input(s): "GLUCAP" in the last 168 hours. Lipid Profile: No results for input(s): "CHOL", "HDL", "LDLCALC", "TRIG", "CHOLHDL", "LDLDIRECT" in the last 72 hours. Thyroid Function Tests: No results for input(s): "TSH", "T4TOTAL", "FREET4", "T3FREE", "THYROIDAB" in the last 72 hours. Anemia Panel: No results for input(s): "VITAMINB12", "FOLATE", "FERRITIN", "TIBC", "IRON",  "RETICCTPCT" in the last 72 hours. Urine analysis:    Component Value Date/Time   COLORURINE YELLOW 08/16/2023 2003   APPEARANCEUR CLEAR 08/16/2023 2003   LABSPEC 1.015 08/16/2023 2003   PHURINE 5.0 08/16/2023 2003   GLUCOSEU NEGATIVE 08/16/2023 2003   HGBUR SMALL (A) 08/16/2023 2003   BILIRUBINUR NEGATIVE 08/16/2023 2003   KETONESUR NEGATIVE 08/16/2023 2003   PROTEINUR NEGATIVE 08/16/2023 2003   UROBILINOGEN 1.0 04/15/2013 2256   NITRITE NEGATIVE 08/16/2023 2003   LEUKOCYTESUR NEGATIVE 08/16/2023 2003    Radiological Exams on Admission: I have personally reviewed images DG Chest Port 1 View Result Date: 08/16/2023 CLINICAL DATA:  Shortness of breath and fever. Current chemotherapy. History of multiple myeloma previous exam. EXAM: PORTABLE CHEST 1 VIEW COMPARISON:  07/25/2023, 07/26/2023. FINDINGS: The heart is enlarged and mediastinal contours is stable. Lung volumes are low with patchy airspace disease at the lung bases, increased from the prior exam. There are small bilateral pleural effusions. No pneumothorax is seen. Multiple lytic lesions are seen within the bones. Bilateral rib fractures are noted and unchanged from the previous exam. IMPRESSION: 1. Increased patchy airspace disease at the lung bases, possible atelectasis or infiltrate. 2. Small bilateral pleural effusions. 3. Stable lytic lesions throughout the bones with bilateral rib fractures and compatible with history of multiple myeloma. Electronically Signed   By: Thornell Sartorius M.D.   On: 08/16/2023 19:29     EKG: My personal interpretation of EKG shows: EKG showing atrial fibrillation RVR which has been improved to normal sinus rhythm heart rate 88 after starting the amiodarone bolus and drip.    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  He is also complaining about left-sided chest wall pain. - Initial sensation to ED patient found febrile, tachycardic, borderline hypotensive and O2 sat 96% room air - EKG showed  A-fib RVR heart rate 147 which improved to 88 after amiodarone bolus and currently on amiodarone drip.  Given patient was hemodynamically stable and did not had any altered mental status did not shock/cardioversion in the ED. - Normal troponin level.  Pending second troponin level. - Currently patient denies any chest pain. -In ED patient has been received amiodarone on 50 mg bolus currently on amiodarone drip with pharmacy consult - Continue heparin drip with pharmacy consult - Cardiology Dr.Chandshaker has been consulted recommended continue amiodarone drip and heparin drip.  Will see patient in the morning. -Continue Coreg 3.125 mg twice daily. -Patient used to be on aspirin 325 mg daily for DVT prophylaxis currently holding it as patient is on heparin drip.  History of CAD and continue continue Plavix 75 mg daily. -Continue to monitor for any evidence of bleeding. - Obtaining echocardiogram. - Continue cardiac monitoring. - Admitting patient to stepdown [facility] at [facility].  Elevated troponin-secondary to demand ischemia in the context of A-fib RVR and sepsis - Initial troponin 11 and second troponin is 25.  EKG showing A-fib RVR and repeat EKG showing normal sinus rhythm with premature ventricular complex.  Heart rate 88 now.  Patient complaining about generalized chest pain due to underlying chronic  refracture. - There is no concern for acute coronary syndrome at this time. - Continue to trend troponin.  Patient is already on heparin drip for the management of A-fib RVR.  Essential hypertension -Holding spironolactone and Lasix in the setting of hypotension.  Continue Coreg 3.125 mg twice daily.  History of CAD status post stent placement in May 2023 -Continue Coreg and Plavix.  Continue Lipitor.  At home patient needs to be on aspirin 325 mg daily for DVT prophylaxis in the setting of cancer.  Holding aspirin given patient is on heparin drip.  Sepsis secondary to pneumonia -At presentation to ED patient is febrile, borderline hypotensive, tachycardic.  Lactic acid within normal range 1.8.  ED patient has been given 500 mL of NS bolus. - Chest x-ray showing bilateral lower lung field early infiltrate. -No evidence of leukocytosis.  CMP obedience of AKI. - Respiratory panel negative for COVID, flu and RSV. - Blood cultures are in process - In the ED patient has been treated with IV vancomycin, ceftriaxone and metronidazole. - Obtaining sputum culture, urine Legionella and urine strep antigen test - Continue broad-spectrum antibiotic coverage with IV vancomycin and cefepime given history of CLL and immunocompromised status. -At the bedside patient blood pressure is borderline soft MAP 68.  Giving 1 L of LR bolus and albumin 25 g. - Continue maintenance fluid LR 125 cc/h.  Continue to trend lactic acid.   Chronic hypoxic respiratory failure History of reactive airway disease -Chronic hypoxic respiratory failure 3 to 4 L oxygen at baseline. - Patient does not have any wheezing.  Denies any cough. - At home patient is on albuterol nebulizer as needed.  In the setting of A-fib RVR holding albuterol. - Continue Xopenex every 6 hours scheduled and ipratropium as needed. - Continue supplemental oxygen 3 to 4 L to keep O2 sat above 92%.  Continue supportive care.   Multiple myeloma-not on  remission-currently on chemotherapy -History of CLL and rib metastasis follows oncology outpatient. - Continue chronic suppression with antiviral regimen Acyclovir 400 mg twice daily. -Continue acyclovir. - Holding aspirin as currently on IV heparin drip. -Continue Decadron 4 mg every other day except on Friday 16 mg. - Continue calcium and vitamin D supplement. -Need  to reach out Dr. Cecille Rubin in the daytime regarding leflunomide dosing.  Obstructive sleep apnea -Continue to check pulse ox and supplemental oxygen.  Cancer-related pain syndrome -Continue home pain medications include quarantine 50 mg twice daily.  Continue oxycodone as needed.  Chronic pathological fracture due to malignancy with metastasis to bilateral rib -Patient declined Zometa infusion in the past.  Continue calcium and vitamin D supplement.  Continue supportive care.   DVT prophylaxis:  IV heparin gtts Code Status:  Full Code Diet: Heart healthy carb modified diet Family Communication:   Family was present at bedside, at the time of interview. Opportunity was given to ask question and all questions were answered satisfactorily.  Disposition Plan: Continue monitor improvement of heart rate, will follow-up with cardiology for final recommendation regarding A-fib RVR.  Continue to monitor with blood culture and sputum culture results for appropriate antibiotic guidance. Consults: Cardiology Admission status:   Inpatient, Step Down Unit  Severity of Illness: The appropriate patient status for this patient is INPATIENT. Inpatient status is judged to be reasonable and necessary in order to provide the required intensity of service to ensure the patient's safety. The patient's presenting symptoms, physical exam findings, and initial radiographic and laboratory data in the context of their chronic comorbidities is felt to place them at high risk for further clinical deterioration. Furthermore, it is not anticipated that the  patient will be medically stable for discharge from the hospital within 2 midnights of admission.   * I certify that at the point of admission it is my clinical judgment that the patient will require inpatient hospital care spanning beyond 2 midnights from the point of admission due to high intensity of service, high risk for further deterioration and high frequency of surveillance required.Marland Kitchen    Tereasa Coop, MD [facility]  How to contact the [facility] Attending or Consulting provider 7A - 7P or covering provider during after hours 7P -7A, for this patient.  Check the care team in [facility] and look for a) attending/consulting [facility] provider listed and b) the [facility] team listed Log into [email or web address] and use [place]'s universal password to access. If you do not have the password, please contact the hospital operator. Locate the [facility] provider you are looking for under [facility] and page to a number that you can be directly reached. If you still have difficulty reaching the provider, please page the [facility] (Director on Call) for the Hospitalists listed on amion for assistance.  08/16/2023, 9:50 PM

## 2023-08-16 NOTE — Progress Notes (Signed)
 Pharmacy Antibiotic Note  Daniel Whitaker is a 78 y.o. adult admitted on 08/16/2023 with pneumonia.  Pharmacy has been consulted for vancomycin and cefepime dosing.  Plan: Vancomycin 2000 mg IV now, then 1750 mg IV q24 hr (est AUC 527 based on SCr 0.92; Vd 0.5) Measure vancomycin AUC at steady state as indicated SCr q48 while on vanc MRSA PCR ordered; f/u and narrow vanc as appropriate Cefepime 2 g IV q8 hr   Height: 6' (182.9 cm) Weight: 101 kg (222 lb 10.6 oz) IBW/kg (Calculated) : 77.6  Temp (24hrs), Avg:102.3 F (39.1 C), Min:102.3 F (39.1 C), Max:102.3 F (39.1 C)  Recent Labs  Lab 08/13/23 0839 08/16/23 1757 08/16/23 1816 08/16/23 2049  WBC 7.9 8.7  --   --   CREATININE 0.74 0.92  --   --   LATICACIDVEN  --   --  1.8 1.3    Estimated Creatinine Clearance (by C-G formula based on SCr of 0.92 mg/dL) Male: 62.1 mL/min Male: 82.7 mL/min    Allergies  Allergen Reactions   Atorvastatin Other (See Comments)    Made the hands ACHE   Cymbalta [Duloxetine Hcl] Nausea Only and Other (See Comments)    Drowsiness, also   Penicillins Hives    Antimicrobials this admission: 3/17 vancomycin >>  3/17 cefepime >>   Dose adjustments this admission:  Microbiology results: 3/17 BCx (x1): sent 3/17 Sputum: ordered  3/17 MRSA PCR: ordered  Thank you for allowing pharmacy to be a part of this patient's care.  Alexea Blase A 08/16/2023 8:53 PM

## 2023-08-17 ENCOUNTER — Encounter: Payer: Self-pay | Admitting: Hematology and Oncology

## 2023-08-17 ENCOUNTER — Other Ambulatory Visit: Payer: Self-pay | Admitting: Hematology and Oncology

## 2023-08-17 ENCOUNTER — Inpatient Hospital Stay (HOSPITAL_COMMUNITY)

## 2023-08-17 DIAGNOSIS — I4891 Unspecified atrial fibrillation: Secondary | ICD-10-CM

## 2023-08-17 DIAGNOSIS — I2510 Atherosclerotic heart disease of native coronary artery without angina pectoris: Secondary | ICD-10-CM | POA: Diagnosis not present

## 2023-08-17 DIAGNOSIS — J9621 Acute and chronic respiratory failure with hypoxia: Secondary | ICD-10-CM

## 2023-08-17 LAB — COMPREHENSIVE METABOLIC PANEL
ALT: 15 U/L (ref 0–44)
AST: 11 U/L — ABNORMAL LOW (ref 15–41)
Albumin: 2.5 g/dL — ABNORMAL LOW (ref 3.5–5.0)
Alkaline Phosphatase: 75 U/L (ref 38–126)
Anion gap: 11 (ref 5–15)
BUN: 33 mg/dL — ABNORMAL HIGH (ref 8–23)
CO2: 22 mmol/L (ref 22–32)
Calcium: 8.9 mg/dL (ref 8.9–10.3)
Chloride: 103 mmol/L (ref 98–111)
Creatinine, Ser: 0.68 mg/dL (ref 0.61–1.24)
GFR, Estimated: >60 mL/min (ref >60)
Glucose, Bld: 134 mg/dL — ABNORMAL HIGH (ref 70–99)
Potassium: 3.5 mmol/L (ref 3.5–5.1)
Sodium: 136 mmol/L (ref 135–145)
Total Bilirubin: 0.6 mg/dL (ref 0.0–1.2)
Total Protein: 6.9 g/dL (ref 6.5–8.1)

## 2023-08-17 LAB — ECHOCARDIOGRAM COMPLETE
AR max vel: 2.6 cm2
AV Area VTI: 2.66 cm2
AV Area mean vel: 2.5 cm2
AV Mean grad: 4 mmHg
AV Peak grad: 8.5 mmHg
Ao pk vel: 1.46 m/s
Area-P 1/2: 3.34 cm2
Height: 72 in
MV VTI: 2.58 cm2
S' Lateral: 2.8 cm
Single Plane A4C EF: 57.6 %
Weight: 3492.09 [oz_av]

## 2023-08-17 LAB — HEPARIN LEVEL (UNFRACTIONATED)
Heparin Unfractionated: <0.1 [IU]/mL — ABNORMAL LOW (ref 0.30–0.70)
Heparin Unfractionated: 0.14 [IU]/mL — ABNORMAL LOW (ref 0.30–0.70)

## 2023-08-17 LAB — CBC
HCT: 28.8 % — ABNORMAL LOW (ref 39.0–52.0)
Hemoglobin: 8.5 g/dL — ABNORMAL LOW (ref 13.0–17.0)
MCH: 27.7 pg (ref 26.0–34.0)
MCHC: 29.5 g/dL — ABNORMAL LOW (ref 30.0–36.0)
MCV: 93.8 fL (ref 80.0–100.0)
Platelets: 238 10*3/uL (ref 150–400)
RBC: 3.07 MIL/uL — ABNORMAL LOW (ref 4.22–5.81)
RDW: 17.8 % — ABNORMAL HIGH (ref 11.5–15.5)
WBC: 7.8 10*3/uL (ref 4.0–10.5)
nRBC: 0 % (ref 0.0–0.2)

## 2023-08-17 LAB — LACTIC ACID, PLASMA: Lactic Acid, Venous: 0.9 mmol/L (ref 0.5–1.9)

## 2023-08-17 LAB — TROPONIN I (HIGH SENSITIVITY)
Troponin I (High Sensitivity): 41 ng/L — ABNORMAL HIGH (ref <18)
Troponin I (High Sensitivity): 41 ng/L — ABNORMAL HIGH (ref <18)

## 2023-08-17 LAB — BRAIN NATRIURETIC PEPTIDE: B Natriuretic Peptide: 265.3 pg/mL — ABNORMAL HIGH (ref 0.0–100.0)

## 2023-08-17 LAB — TSH: TSH: 0.452 u[IU]/mL (ref 0.350–4.500)

## 2023-08-17 MED ORDER — AZITHROMYCIN 250 MG PO TABS
500.0000 mg | ORAL_TABLET | Freq: Every day | ORAL | Status: AC
  Administered 2023-08-17 – 2023-08-19 (×3): 500 mg via ORAL
  Filled 2023-08-17 (×3): qty 2

## 2023-08-17 MED ORDER — HEPARIN BOLUS VIA INFUSION
2000.0000 [IU] | Freq: Once | INTRAVENOUS | Status: AC
  Administered 2023-08-17: 2000 [IU] via INTRAVENOUS
  Filled 2023-08-17: qty 2000

## 2023-08-17 MED ORDER — DEXAMETHASONE 6 MG PO TABS: 16.0000 mg | ORAL_TABLET | ORAL | Status: DC

## 2023-08-17 MED ORDER — ASPIRIN 81 MG PO TBEC
81.0000 mg | DELAYED_RELEASE_TABLET | Freq: Every day | ORAL | Status: DC
  Administered 2023-08-17: 81 mg via ORAL
  Filled 2023-08-17: qty 1

## 2023-08-17 MED ORDER — APIXABAN 5 MG PO TABS
5.0000 mg | ORAL_TABLET | Freq: Two times a day (BID) | ORAL | Status: DC
  Administered 2023-08-17 – 2023-08-20 (×7): 5 mg via ORAL
  Filled 2023-08-17 (×7): qty 1

## 2023-08-17 MED ORDER — OXYCODONE HCL 5 MG PO TABS
5.0000 mg | ORAL_TABLET | Freq: Once | ORAL | Status: DC
  Filled 2023-08-17: qty 1

## 2023-08-17 MED ORDER — OXYCODONE HCL 5 MG PO TABS: 10.0000 mg | ORAL_TABLET | ORAL | Status: DC | PRN

## 2023-08-17 MED ORDER — LEVALBUTEROL HCL 0.63 MG/3ML IN NEBU: 0.6300 mg | INHALATION_SOLUTION | Freq: Four times a day (QID) | RESPIRATORY_TRACT | Status: DC | PRN

## 2023-08-17 MED ORDER — DEXAMETHASONE 2 MG PO TABS
2.0000 mg | ORAL_TABLET | ORAL | Status: DC
  Administered 2023-08-18: 2 mg via ORAL
  Filled 2023-08-17 (×2): qty 1

## 2023-08-17 MED ORDER — CHLORHEXIDINE GLUCONATE CLOTH 2 % EX PADS
6.0000 | MEDICATED_PAD | Freq: Every evening | CUTANEOUS | Status: DC
  Administered 2023-08-17 – 2023-08-18 (×2): 6 via TOPICAL

## 2023-08-17 MED ORDER — PERFLUTREN LIPID MICROSPHERE
1.0000 mL | INTRAVENOUS | Status: AC | PRN
  Administered 2023-08-17: 2 mL via INTRAVENOUS

## 2023-08-17 MED ORDER — DEXAMETHASONE 4 MG PO TABS
4.0000 mg | ORAL_TABLET | ORAL | Status: DC
  Administered 2023-08-17 – 2023-08-19 (×2): 4 mg via ORAL
  Filled 2023-08-17 (×2): qty 2

## 2023-08-17 MED ORDER — OXYCODONE HCL 5 MG PO TABS
5.0000 mg | ORAL_TABLET | Freq: Every day | ORAL | Status: DC | PRN
  Administered 2023-08-17 – 2023-08-19 (×2): 5 mg via ORAL
  Filled 2023-08-17: qty 1

## 2023-08-17 MED ORDER — OXYCODONE HCL 5 MG PO TABS
10.0000 mg | ORAL_TABLET | ORAL | Status: DC | PRN
  Administered 2023-08-17 – 2023-08-20 (×18): 10 mg via ORAL
  Filled 2023-08-17 (×18): qty 2

## 2023-08-17 MED ORDER — LEVALBUTEROL HCL 0.63 MG/3ML IN NEBU
0.6300 mg | INHALATION_SOLUTION | Freq: Three times a day (TID) | RESPIRATORY_TRACT | Status: DC
  Administered 2023-08-18 – 2023-08-20 (×8): 0.63 mg via RESPIRATORY_TRACT
  Filled 2023-08-17 (×8): qty 3

## 2023-08-17 NOTE — Progress Notes (Signed)
 PROGRESS NOTE    Daniel Whitaker  ZOX:[id number] DOB: Jun 29, 1945 DOA: 08/16/2023 PCP: [name], MD   Brief Narrative: Daniel Whitaker is a 78 y.o. adult with a history of chronic respiratory failure, multiple myeloma with bone involvement, anemia of chronic disease, hypertension, CAD s/p stent, hyperlipidemia, OSA, glaucoma.  Patient presented secondary to fever and shortness of breath and found to have evidence of sepsis secondary to pneumonia, complicated by atrial fibrillation with RVR. Antibiotics started and amiodarone/heparin started for atrial fibrillation with reversion to sinus rhythm. Cardiology consulted.   Assessment and Plan:  Atrial fibrillation Newly diagnosed this admission. Likely related to acute illness. Patient started on amiodarone and heparin with eventual cardioversion back to sinus rhythm. Amiodarone discontinued. Cardiology consulted. Transthoracic Echocardiogram significant for LVEF of 60-65% with severely dilated left atrium. -Cardiology recommendations: Coreg, Eliquis -Continue telemetry  Chronic respiratory failure with hypoxia Patient uses 4 L/min of oxygen at baseline. Patient requiring up to 4 L/min this admission. Secondary to pneumonia. -Continue oxygen  Chest pain This appears to be related to known multiple myeloma  Sepsis Present on admission. Secondary to pneumonia. Blood cultures obtained on admission. Vancomycin and cefepime started empirically. MRSA PCR negative. -Continue Cefepime -Discontinue Vancomycin -Add azithromycin -Follow-up blood cultures  Community acquired pneumonia Present on admission. Chest x-ray significant for patchy airspace disease.  Multiple myeloma Patient follows with oncology as an outpatient. Patient with bone involvement, including lesions leading to bilateral rib fractures.  Chronic pain Secondary to bone lesions and fractures. Patient is on dexamethasone, Morphine and oxycodone -Continue outpatient  regimen  Aortic dilation Noted on Transthoracic Echocardiogram, measuring 39 mm.  Elevated troponin In setting of atrial fibrillation with RVR and sepsis. Likely demand ischemia.  Primary hypertension -Continue Coreg  CAD History of coronary artery stent placement. Patient is managed on Coreg, aspirin, Plavix and Lipitor as an outpatient.  Pressure injury Medial coccyx. Present on admission.     DVT prophylaxis: Eliquis Code Status:   Code Status: Full Code Family Communication: Granddaughter at bedside and family member on telephone Disposition Plan: Discharge home pending culture data and transition to outpatient antibiotics   Consultants:  Cardiology  Procedures:  Transthoracic Echocardiogram  Antimicrobials: Vancomycin Cefepime Azithromycin   Subjective: Patient reports chronic chest pain. Overall feeling better than on admission.  Objective: BP (!) 112/36   Pulse (!) 56   Temp 97.8 F (36.6 C) (Oral)   Resp (!) 9   Ht 6' (1.829 m)   Wt 99 kg Comment: with x2 extra pillows  SpO2 98%   BMI 29.60 kg/m   Examination:  General exam: Appears calm and comfortable Respiratory system: Mild rhonchi. Respiratory effort normal. Cardiovascular system: S1 & S2 heard, RRR. No murmurs. Gastrointestinal system: Abdomen is nondistended, soft and nontender. Normal bowel sounds heard. Central nervous system: Alert and oriented. No focal neurological deficits. Psychiatry: Judgement and insight appear normal. Mood & affect appropriate.    Data Reviewed: I have personally reviewed following labs and imaging studies  CBC Lab Results  Component Value Date   WBC 7.8 08/17/2023   RBC 3.07 (L) 08/17/2023   HGB 8.5 (L) 08/17/2023   HCT 28.8 (L) 08/17/2023   MCV 93.8 08/17/2023   MCH 27.7 08/17/2023   PLT 238 08/17/2023   MCHC 29.5 (L) 08/17/2023   RDW 17.8 (H) 08/17/2023   LYMPHSABS 1.5 08/13/2023   MONOABS 1.0 08/13/2023   EOSABS 0.2 08/13/2023   BASOSABS 0.1  08/13/2023     Last metabolic panel  Lab Results  Component Value Date   NA 136 08/17/2023   K 3.5 08/17/2023   CL 103 08/17/2023   CO2 22 08/17/2023   BUN 33 (H) 08/17/2023   CREATININE 0.68 08/17/2023   GLUCOSE 134 (H) 08/17/2023   GFRNONAA >60 08/17/2023   GFRAA >60 02/14/2020   CALCIUM 8.9 08/17/2023   PHOS 3.1 07/23/2023   PROT 6.9 08/17/2023   ALBUMIN 2.5 (L) 08/17/2023   LABGLOB 4.8 (H) 08/06/2023   AGRATIO 1.5 04/06/2013   BILITOT 0.6 08/17/2023   ALKPHOS 75 08/17/2023   AST 11 (L) 08/17/2023   ALT 15 08/17/2023   ANIONGAP 11 08/17/2023    GFR: Estimated Creatinine Clearance (by C-G formula based on SCr of 0.68 mg/dL) Male: 13.0 mL/min Male: 94.3 mL/min  Recent Results (from the past 240 hours)  Blood Culture (routine x 2)     Status: None (Preliminary result)   Collection Time: 08/16/23  5:57 PM   Specimen: BLOOD LEFT HAND  Result Value Ref Range Status   Specimen Description   Final    BLOOD LEFT HAND Performed at [facility], [address], [place], [place] [address]    Special Requests   Final    BOTTLES DRAWN AEROBIC AND ANAEROBIC Blood Culture adequate volume Performed at [facility], [address], [place], [place] [address]    Culture   Final    NO GROWTH < 12 HOURS Performed at [facility], [address] [place], [place] [address]    Report Status PENDING  Incomplete  Resp panel by RT-PCR (RSV, Flu A&B, Covid) Anterior Nasal Swab     Status: None   Collection Time: 08/16/23  6:27 PM   Specimen: Anterior Nasal Swab  Result Value Ref Range Status   SARS Coronavirus 2 by RT PCR NEGATIVE NEGATIVE Final    Comment: (NOTE) SARS-CoV-2 target nucleic acids are NOT DETECTED.  The SARS-CoV-2 RNA is generally detectable in upper respiratory specimens during the acute phase of infection. The lowest concentration of SARS-CoV-2 viral copies this assay can detect is 138 copies/mL. A negative result does not  preclude SARS-Cov-2 infection and should not be used as the sole basis for treatment or other patient management decisions. A negative result may occur with  improper specimen collection/handling, submission of specimen other than nasopharyngeal swab, presence of viral mutation(s) within the areas targeted by this assay, and inadequate number of viral copies(<138 copies/mL). A negative result must be combined with clinical observations, patient history, and epidemiological information. The expected result is Negative.  Fact Sheet for Patients:  BloggerCourse.com  Fact Sheet for Healthcare Providers:  SeriousBroker.it  This test is no t yet approved or cleared by the [place] FDA and  has been authorized for detection and/or diagnosis of SARS-CoV-2 by FDA under an Emergency Use Authorization (EUA). This EUA will remain  in effect (meaning this test can be used) for the duration of the COVID-19 declaration under Section 564(b)(1) of the Act, 21 U.S.C.section 360bbb-3(b)(1), unless the authorization is terminated  or revoked sooner.       Influenza A by PCR NEGATIVE NEGATIVE Final   Influenza B by PCR NEGATIVE NEGATIVE Final    Comment: (NOTE) The Xpert Xpress SARS-CoV-2/FLU/RSV plus assay is intended as an aid in the diagnosis of influenza from Nasopharyngeal swab specimens and should not be used as a sole basis for treatment. Nasal washings and aspirates are unacceptable for Xpert Xpress SARS-CoV-2/FLU/RSV testing.  Fact Sheet  for Patients: BloggerCourse.com  Fact Sheet for Healthcare Providers: SeriousBroker.it  This test is not yet approved or cleared by the [place] FDA and has been authorized for detection and/or diagnosis of SARS-CoV-2 by FDA under an Emergency Use Authorization (EUA). This EUA will remain in effect (meaning this test can be used) for the  duration of the COVID-19 declaration under Section 564(b)(1) of the Act, 21 U.S.C. section 360bbb-3(b)(1), unless the authorization is terminated or revoked.     Resp Syncytial Virus by PCR NEGATIVE NEGATIVE Final    Comment: (NOTE) Fact Sheet for Patients: BloggerCourse.com  Fact Sheet for Healthcare Providers: SeriousBroker.it  This test is not yet approved or cleared by the [place] FDA and has been authorized for detection and/or diagnosis of SARS-CoV-2 by FDA under an Emergency Use Authorization (EUA). This EUA will remain in effect (meaning this test can be used) for the duration of the COVID-19 declaration under Section 564(b)(1) of the Act, 21 U.S.C. section 360bbb-3(b)(1), unless the authorization is terminated or revoked.  Performed at [facility], [address] [place], [place] [address]   MRSA Next Gen by PCR, Nasal     Status: None   Collection Time: 08/16/23 10:30 PM   Specimen: Nasal Mucosa; Nasal Swab  Result Value Ref Range Status   MRSA by PCR Next Gen NOT DETECTED NOT DETECTED Final    Comment: (NOTE) The GeneXpert MRSA Assay (FDA approved for NASAL specimens only), is one component of a comprehensive MRSA colonization surveillance program. It is not intended to diagnose MRSA infection nor to guide or monitor treatment for MRSA infections. Test performance is not FDA approved in patients less than 21 years old. Performed at [facility], [address] [place], [place] [address]   Blood Culture (routine x 2)     Status: None (Preliminary result)   Collection Time: 08/17/23 12:37 AM   Specimen: BLOOD RIGHT ARM  Result Value Ref Range Status   Specimen Description   Final    BLOOD RIGHT ARM Performed at [facility], [address], [place], [place] [address]    Special Requests   Final    BOTTLES DRAWN AEROBIC ONLY Blood Culture adequate  volume Performed at [facility], [address] [place], [place] [address]    Culture   Final    NO GROWTH <12 HOURS Performed at [facility], [address], [place], [place] [address]    Report Status PENDING  Incomplete      Radiology Studies: DG Chest Port 1 View Result Date: 08/16/2023 CLINICAL DATA:  Shortness of breath and fever. Current chemotherapy. History of multiple myeloma previous exam. EXAM: PORTABLE CHEST 1 VIEW COMPARISON:  07/25/2023, 07/26/2023. FINDINGS: The heart is enlarged and mediastinal contours is stable. Lung volumes are low with patchy airspace disease at the lung bases, increased from the prior exam. There are small bilateral pleural effusions. No pneumothorax is seen. Multiple lytic lesions are seen within the bones. Bilateral rib fractures are noted and unchanged from the previous exam. IMPRESSION: 1. Increased patchy airspace disease at the lung bases, possible atelectasis or infiltrate. 2. Small bilateral pleural effusions. 3. Stable lytic lesions throughout the bones with bilateral rib fractures and compatible with history of multiple myeloma. Electronically Signed   By: Thornell Sartorius M.D.   On: 08/16/2023 19:29      LOS: 1 day    Jacquelin Hawking, MD [facility] 08/17/2023, 7:23 AM  If 7PM-7AM, please contact night-coverage [email or web address]

## 2023-08-17 NOTE — Discharge Instructions (Signed)

## 2023-08-17 NOTE — Progress Notes (Signed)
 PHARMACY - ANTICOAGULATION CONSULT NOTE  Pharmacy Consult for IV heparin Indication: atrial fibrillation  Allergies  Allergen Reactions   Atorvastatin Other (See Comments)    Made the hands ACHE   Cymbalta [Duloxetine Hcl] Nausea Only and Other (See Comments)    Drowsiness, also   Penicillins Hives    Patient Measurements: Height: 6' (182.9 cm) Weight: 99 kg (218 lb 4.1 oz) (with x2 extra pillows) IBW/kg (Calculated) : 77.6 Heparin Dosing Weight: 98 kg  Vital Signs: Temp: 97.8 F (36.6 C) (03/18 0300) Temp Source: Oral (03/18 0300) BP: 100/37 (03/18 0330) Pulse Rate: 59 (03/18 0330)  Labs: Recent Labs    08/16/23 1757 08/16/23 2042 08/17/23 0037 08/17/23 0303  HGB 10.4*  --  8.5*  --   HCT 33.0*  --  28.8*  --   PLT 268  --  238  --   APTT 30  --   --   --   LABPROT 15.8*  --   --   --   INR 1.2  --   --   --   HEPARINUNFRC  --   --   --  <0.10*  CREATININE 0.92  --  0.68  --   TROPONINIHS 11 25* 41* 41*    Estimated Creatinine Clearance (by C-G formula based on SCr of 0.68 mg/dL) Male: 16.1 mL/min Male: 94.3 mL/min   Medical History: 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   Nocturnal leg cramps 03/11/2021   Nontoxic uninodular goiter    Obesity    Peripheral neuropathy    RLS (restless legs syndrome)    Spinal stenosis    Unspecified deficiency anemia 07/11/2013    Medications:  Medications Prior to Admission  Medication Sig Dispense Refill Last Dose/Taking   acyclovir (ZOVIRAX) 400 MG tablet Take 1 tablet (400 mg total) by mouth daily. 30 tablet 6 08/16/2023 Morning   albuterol (VENTOLIN HFA) 108 (90 Base) MCG/ACT inhaler Inhale 1-2 puffs into the lungs  every 6 (six) hours as needed for wheezing or shortness of breath.   Taking As Needed   aspirin EC 325 MG tablet Take 1 tablet (325 mg total) by mouth daily.   08/16/2023 Morning   brimonidine (ALPHAGAN) 0.2 % ophthalmic solution Place 1 drop into the left eye in the morning and at bedtime.   08/15/2023 Bedtime   carvedilol (COREG) 3.125 MG tablet TAKE 1 TABLET BY MOUTH TWICE DAILY 180 tablet 3 08/16/2023 Morning   cetirizine (ZYRTEC) 10 MG tablet Take 10 mg by mouth every morning.   Taking   cyclobenzaprine (FLEXERIL) 10 MG tablet Take 10 mg by mouth daily as needed for muscle spasms.   Taking As Needed   DARZALEX FASPRO 1800-30000 MG-UT/15ML SOLN Inject 1,800 mg into the skin once a week. Every friday   08/13/2023   dexamethasone (DECADRON) 4 MG tablet Take 1 pill daily except 4 pills every Friday (Patient taking differently: Take 1 pill Sunday, Tuesday, Thursday. except 4 pills every Friday. Can take half a pill on Monday and Wednesday if needed)   08/15/2023   docusate sodium (COLACE) 100 MG capsule Take 300 mg by mouth daily.  08/16/2023 Morning   dorzolamide-timolol (COSOPT) 22.3-6.8 MG/ML ophthalmic solution Place 1 drop into both eyes 2 (two) times daily.   08/16/2023 Morning   furosemide (LASIX) 40 MG tablet Take 40 mg in the morning and 20 mg in the afternoon 90 tablet 1 08/16/2023 Morning   ketoconazole (NIZORAL) 2 % cream Apply 1 application  topically daily as needed for irritation.   Taking As Needed   latanoprost (XALATAN) 0.005 % ophthalmic solution Place 1 drop into both eyes at bedtime.   08/16/2023 Morning   lenalidomide (REVLIMID) 10 MG capsule Take 1 capsule (10 mg total) by mouth daily. Take 1 capsule daily for 14 days on and 7 days off, repeat every 21 days. [employer] Auth #   [id number] Date Obtained 07/29/23 14 capsule 0 Taking   lidocaine (XYLOCAINE) 5 % ointment Apply 1 Application topically 2 (two) times daily as needed. (Patient taking differently: Apply 1 Application topically daily  as needed for mild pain (pain score 1-3) or moderate pain (pain score 4-6) (for foot pain).) 35.44 g 1 Taking Differently   morphine (MS CONTIN) 15 MG 12 hr tablet Take 15 mg by mouth at bedtime.   08/15/2023 Bedtime   Multiple Vitamins-Minerals (CENTRUM SILVER 50+MEN) TABS Take 1 tablet by mouth daily with breakfast.   08/16/2023 Morning   nitroGLYCERIN (NITROSTAT) 0.4 MG SL tablet Place 1 tablet (0.4 mg total) under the tongue every 5 (five) minutes as needed. (Patient taking differently: Place 0.4 mg under the tongue every 5 (five) minutes as needed for chest pain.) 25 tablet 2 Taking Differently   ondansetron (ZOFRAN) 8 MG tablet Take 1 tablet (8 mg total) by mouth every 8 (eight) hours as needed for nausea or vomiting. 30 tablet 1 Taking As Needed   Oxycodone HCl 10 MG TABS Take 1 tablet (10 mg total) by mouth every 3 (three) hours as needed. (Patient taking differently: Take 10 mg by mouth every 4 (four) hours.)   08/16/2023 at  1:30 PM   pantoprazole (PROTONIX) 40 MG tablet Take 40 mg by mouth daily before breakfast.   08/16/2023 Morning   polyethylene glycol (MIRALAX / GLYCOLAX) 17 g packet Take 17 g by mouth 2 (two) times daily.   08/16/2023 Morning   pregabalin (LYRICA) 100 MG capsule TAKE 1 CAPSULE BY MOUTH IN THE MORNING, AND AT NOON, AND 2 CAPSULES AT NIGHT (Patient taking differently: Take 100 mg by mouth in the morning and at bedtime. 100 mg in the AM and 200 mg in the PM (Also can have 100 mg at lunch)) 120 capsule 5 08/16/2023 Morning   prochlorperazine (COMPAZINE) 10 MG tablet Take 1 tablet (10 mg total) by mouth every 6 (six) hours as needed for nausea or vomiting. 30 tablet 1 Taking As Needed   SSD 1 % cream Apply 1 Application topically daily.   Taking   triamcinolone (NASACORT) 55 MCG/ACT AERO nasal inhaler Place 2 sprays into the nose daily. (Patient taking differently: Place 1 spray into the nose daily as needed.) 1 each 12 Taking Differently   TYLENOL 500 MG tablet Take 1,000 mg by  mouth every 6 (six) hours as needed for mild pain (pain score 1-3) or headache.   Taking As Needed   azithromycin (ZITHROMAX) 250 MG tablet Take 250 mg by mouth as directed. (Patient not taking: Reported on 08/16/2023)   Not Taking   DENTA 5000 PLUS 1.1 % CREA dental cream Place 1 Application onto teeth at bedtime.      levofloxacin (LEVAQUIN)  750 MG tablet Take 750 mg by mouth daily. (Patient not taking: Reported on 08/16/2023)   Not Taking   rosuvastatin (CRESTOR) 40 MG tablet TAKE 1 TABLET BY MOUTH DAILY (Patient not taking: Reported on 08/16/2023) 90 tablet 3 Not Taking   senna-docusate (SENOKOT-S) 8.6-50 MG tablet Take 2 tablets by mouth 2 (two) times daily.      spironolactone (ALDACTONE) 25 MG tablet Take 1 tablet (25 mg total) by mouth in the morning. (Patient not taking: Reported on 08/16/2023) 30 tablet 0 Not Taking   Scheduled:   acyclovir  400 mg Oral Daily   brimonidine  1 drop Left Eye BID   calcium-vitamin D  1 tablet Oral BID   carvedilol  3.125 mg Oral BID   Chlorhexidine Gluconate Cloth  6 each Topical Daily   clopidogrel  75 mg Oral Daily   [START ON 08/20/2023] dexamethasone  16 mg Oral Daily   dexamethasone  4 mg Oral QODAY   docusate sodium  100 mg Oral BID   dorzolamide-timolol  1 drop Both Eyes BID   latanoprost  1 drop Both Eyes QHS   levalbuterol  0.63 mg Nebulization Q6H   morphine  15 mg Oral QHS   multivitamin with minerals  1 tablet Oral Q breakfast   pantoprazole  40 mg Oral QAC breakfast   polyethylene glycol  17 g Oral BID   pregabalin  100 mg Oral BID   senna-docusate  2 tablet Oral BID   sodium chloride flush  3 mL Intravenous Q12H   sodium chloride flush  3 mL Intravenous Q12H   triamcinolone  2 spray Nasal Daily   PRN:   Assessment: 22 yoM with PMH multiple myeloma currently on chemo, CAD, OSA, presents to ED with possible sepsis. Noted to be in atrial fibrillation in ED. Pharmacy consulted to dose IV heparin  Baseline INR, aPTT: WNL Prior  anticoagulation: none  Significant events:  Today, 08/17/2023: HL < 0.1 subtherapeutic on 1500 units/hr Hgb down to 8.5, plts 238 No bleeding or infusion issues per nursing  Goal of Therapy: Heparin level 0.3-0.7 units/ml Monitor platelets by anticoagulation protocol: Yes  Plan: Heparin 2000 units IV bolus x 1 Increase heparin drip to 1800 units/hr Heparin level in 8 hours Daily CBC, daily heparin level once stable Monitor for signs of bleeding or thrombosis F/u plans (if any) for long-term anticoagulation  Arley Phenix RPh 08/17/2023, 3:48 AM

## 2023-08-17 NOTE — Progress Notes (Signed)
 Daniel Whitaker   DOB:[birth year]   JY#:[id number]    ASSESSMENT & PLAN:  Multiple myeloma without remission (HCC) He has IgA kappa multiple myeloma with multiple bone fractures at presentation in 2024.  Bone marrow biopsy showed 15% involvement, standard risk myeloma FISH and cytogenetics  He has good partial response with combination chemotherapy with daratumumab, lenalidomide, bortezomib and dexamethasone His treatment was interrupted in February due to fluid overload and anemia He is currently admitted for management of atrial fibrillation with rapid ventricular response and fever His treatment will be placed on hold, including lenalidomide He will continue dexamethasone as prescribed He will continue vitamin D supplement and calcium He is on acyclovir for antimicrobial prophylaxis  Atrial fibrillation with rapid ventricular response He has converted to normal sinus rhythm after amiodarone drip Echocardiogram showed preserved ejection fraction but dilated atrium I will defer to cardiologist for management Agree with decision for NOAC  Cancer associated pain He has recent exacerbation of bone pain due to steroid withdrawal Pain control is improved reintroduction of dexamethasone and MS Contin at night He will continue oxycodone as needed along with acetaminophen  Fever, resolved Atypical infection is possible Procalcitonin is normal Agree with antibiotics for 1 more day before de-escalation if cultures are negative  Anemia Multifactorial, likely due to side effects of recent chemotherapy as well as component of hemodilution He is not symptomatic Monitor closely  Discharge planning Unknown I will place his chemotherapy on hold this week  [name], MD 08/17/2023 12:57 PM  Subjective:  Patient is well-known to me.  I was notified of his admission due to episode of fever and confusion Upon arrival to the emergency room, he was found to have atrial fibrillation with rapid  ventricular response and received amiodarone, IV fluids, IV antibiotics as well as IV heparin When I saw the patient this afternoon, he is a bit better and back to normal sinus rhythm His oxygen saturation is adequate His pain control is stable His wife and daughter by the bedside, grandson over the phone We reviewed test results and discussed plan of care and addressed all their questions to their satisfaction  Objective:  Vitals:   08/17/23 1025 08/17/23 1134  BP: (!) 130/45   Pulse: 64   Resp: 13   Temp:  98.8 F (37.1 C)  SpO2: 96%      Intake/Output Summary (Last 24 hours) at 08/17/2023 1257 Last data filed at 08/17/2023 0800 Gross per 24 hour  Intake 3637.6 ml  Output 400 ml  Net 3237.6 ml   Examination of his chest, reduced breath sounds throughout Cardiovascular exam: He has a regular rate and rhythm, no murmurs  I have reviewed his chest x-ray and blood work

## 2023-08-17 NOTE — TOC Initial Note (Signed)
 Transition of Care [facility]) - Initial/Assessment Note    Patient Details  Name: Daniel Whitaker MRN: [id number] Date of Birth: [birth year]  Transition of Care [facility]) CM/SW Contact:    Diona Browner, LCSW Phone Number: 08/17/2023, 8:49 AM  Clinical Narrative:                 Pt from home w/ spouse. Pt on O2 4L at home. Pt continues medical workup. TOC following for needs.     Barriers to Discharge: Continued Medical Work up   Patient Goals and CMS Choice Patient states their goals for this hospitalization and ongoing recovery are:: return home CMS Medicare.gov Compare Post Acute Care list provided to::  (NA) Choice offered to / list presented to : NA [place] ownership interest in [facility].provided to::  (NA)    Expected Discharge Plan and Services     Post Acute Care Choice: NA Living arrangements for the past 2 months: Single Family Home                 DME Arranged: N/A DME Agency: NA       HH Arranged: NA HH Agency: NA        Prior Living Arrangements/Services Living arrangements for the past 2 months: Single Family Home Lives with:: Spouse Patient language and need for interpreter reviewed:: Yes Do you feel safe going back to the place where you live?: Yes      Need for Family Participation in Patient Care: Yes (Comment) Care giver support system in place?: Yes (comment)   Criminal Activity/Legal Involvement Pertinent to Current Situation/Hospitalization: No - Comment as needed  Activities of Daily Living   ADL Screening (condition at time of admission) Independently performs ADLs?: Yes (appropriate for developmental age) Is the patient deaf or have difficulty hearing?: Yes Does the patient have difficulty seeing, even when wearing glasses/contacts?: Yes Does the patient have difficulty concentrating, remembering, or making decisions?: No  Permission Sought/Granted                  Emotional Assessment Appearance:: Appears stated  age Attitude/Demeanor/Rapport: Engaged Affect (typically observed): Accepting Orientation: : Oriented to Place, Oriented to Self, Oriented to  Time, Oriented to Situation Alcohol / Substance Use: Not Applicable Psych Involvement: No (comment)  Admission diagnosis:  Atrial fibrillation with RVR (HCC) [I48.91] Fever, unspecified fever cause [R50.9] Multiple myeloma, remission status unspecified (HCC) [C90.00] Patient Active Problem List   Diagnosis Date Noted   Sepsis due to pneumonia (HCC) 08/16/2023   Atrial fibrillation with RVR (HCC) 08/16/2023   Chronic hypoxic respiratory failure (HCC) 08/16/2023   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) 08/16/2023   Poor dentition 08/04/2023   Mild protein-calorie malnutrition (HCC) 07/30/2023   Fluid overload 07/23/2023   Acute respiratory failure with hypoxia (HCC) 07/23/2023   Pressure injury of skin 07/23/2023   URI (upper respiratory infection) 07/23/2023   Reactive airway disease 07/23/2023   Glaucoma 07/23/2023   Pancytopenia, acquired (HCC) 07/09/2023   Decubitus ulcer of buttock, stage 1 07/09/2023   Orthopnea 07/02/2023   Right leg pain 06/18/2023   Acquired hypogammaglobulinemia (HCC) 06/13/2023   Post-nasal drip 06/03/2023   Other constipation 05/21/2023   Cancer related pain 05/21/2023   Hypercalcemia 05/03/2023   AKI (acute kidney injury) (HCC) 05/03/2023   Rib fractures 05/03/2023   Atelectasis 05/03/2023   Unilateral primary osteoarthritis, left knee 06/08/2022   Essential hypertension 05/03/2022   Anemia of chronic disease 03/27/2022  Restless leg syndrome 01/19/2022   Gait abnormality 01/19/2022   Obstructive sleep apnea 01/19/2022   CAD (coronary artery disease) 10/22/2021   Hyperlipidemia 10/22/2021   Class 1 obesity due to excess calories in adult 03/20/2021   Nocturnal leg cramps 03/11/2021   Lumbar radiculopathy 03/15/2017   Multiple myeloma (HCC) 07/11/2013   Deficiency  anemia 07/11/2013   Hypokalemia 04/15/2013   Hyperglycemia 04/15/2013   Neuropathy 04/15/2013   Hereditary and idiopathic peripheral neuropathy 04/06/2013   Cervical spondylosis 12/06/2012   PCP:  [name], MD Pharmacy:   [facility] - [place], [place] - [address] [place] [address] Phone: [phone] Fax: [phone]  [facility], [place] - [address] [address] [place] [address] Phone: [phone] Fax: [phone]  [place] [facility] [address] [place] [address] Phone: [phone] Fax: [phone]     Social Drivers of Health (SDOH) Social History: SDOH Screenings   Food Insecurity: No Food Insecurity (08/17/2023)  Housing: Low Risk  (08/17/2023)  Transportation Needs: No Transportation Needs (08/17/2023)  Utilities: Not At Risk (08/17/2023)  Recent Concern: Utilities - At Risk (07/23/2023)  Depression (PHQ2-9): Low Risk  (03/12/2022)  Social Connections: Moderately Integrated (08/17/2023)  Tobacco Use: Medium Risk (08/16/2023)   SDOH Interventions:     Readmission Risk Interventions    08/17/2023    8:48 AM 05/07/2023   10:37 AM  Readmission Risk Prevention Plan  Transportation Screening Complete Complete  PCP or Specialist Appt within 5-7 Days  Complete  Home Care Screening  Complete  Medication Review (RN CM)  Complete  Medication Review (RN Care Manager) Complete   PCP or Specialist appointment within 3-5 days of discharge Complete   HRI or Home Care Consult Complete   SW Recovery Care/Counseling Consult Complete   Palliative Care Screening Not Applicable   Skilled Nursing Facility Not Applicable

## 2023-08-17 NOTE — Progress Notes (Signed)
   08/17/23 2325  BiPAP/CPAP/SIPAP  BiPAP/CPAP/SIPAP Pt Type Adult (Patient home equipment, machine plugged into red outlet)  BiPAP/CPAP/SIPAP Resmed  Mask Type Nasal pillows (Patient home equipment)  Dentures removed? Not applicable  Respiratory Rate 15 breaths/min  Flow Rate 4 lpm (bled into circuit)  Patient Home Equipment Yes

## 2023-08-17 NOTE — Progress Notes (Signed)
   08/17/23 0115  Vitals  BP (!) 115/41  MAP (mmHg) 68  Pulse Rate (!) 58  ECG Heart Rate (!) 58  Resp 16  Oxygen Therapy  SpO2 97 %   Vitals reviewed with Chinita Greenland NP; pts b/p on the low side, now Sinus Brady 58-60 sustaining.  Keep amio off; do not start 30mg  dose at this time

## 2023-08-17 NOTE — Plan of Care (Signed)
  Problem: Activity: Goal: Ability to tolerate increased activity will improve Outcome: Progressing   Problem: Clinical Measurements: Goal: Ability to maintain a body temperature in the normal range will improve Outcome: Progressing   Problem: Respiratory: Goal: Ability to maintain adequate ventilation will improve Outcome: Progressing Goal: Ability to maintain a clear airway will improve Outcome: Progressing   Problem: Education: Goal: Knowledge of disease or condition will improve Outcome: Progressing Goal: Understanding of medication regimen will improve Outcome: Progressing Goal: Individualized Educational Video(s) Outcome: Progressing   Problem: Activity: Goal: Ability to tolerate increased activity will improve Outcome: Progressing   Problem: Cardiac: Goal: Ability to achieve and maintain adequate cardiopulmonary perfusion will improve Outcome: Progressing   Problem: Health Behavior/Discharge Planning: Goal: Ability to safely manage health-related needs after discharge will improve Outcome: Progressing   Problem: Education: Goal: Knowledge of General Education information will improve Description: Including pain rating scale, medication(s)/side effects and non-pharmacologic comfort measures Outcome: Progressing   Problem: Health Behavior/Discharge Planning: Goal: Ability to manage health-related needs will improve Outcome: Progressing   Problem: Clinical Measurements: Goal: Ability to maintain clinical measurements within normal limits will improve Outcome: Progressing Goal: Will remain free from infection Outcome: Progressing Goal: Diagnostic test results will improve Outcome: Progressing Goal: Respiratory complications will improve Outcome: Progressing Goal: Cardiovascular complication will be avoided Outcome: Progressing   Problem: Activity: Goal: Risk for activity intolerance will decrease Outcome: Progressing   Problem: Nutrition: Goal: Adequate  nutrition will be maintained Outcome: Progressing   Problem: Coping: Goal: Level of anxiety will decrease Outcome: Progressing   Problem: Elimination: Goal: Will not experience complications related to bowel motility Outcome: Progressing Goal: Will not experience complications related to urinary retention Outcome: Progressing   Problem: Pain Managment: Goal: General experience of comfort will improve and/or be controlled Outcome: Progressing   Problem: Safety: Goal: Ability to remain free from injury will improve Outcome: Progressing   Problem: Skin Integrity: Goal: Risk for impaired skin integrity will decrease Outcome: Progressing

## 2023-08-17 NOTE — Consult Note (Addendum)
 Cardiology Consultation   Patient ID: [name] MRN: [id number]; DOB: 26-Sep-1945  Admit date: 08/16/2023 Date of Consult: 08/17/2023  PCP:  Daniel Raider, MD   [facility] Providers Cardiologist:  [name], MD        Patient Profile:   Daniel Whitaker is a 78 y.o. adult with a hx of multiple myeloma, chronic hypoxic respiratory failure, anemia, hypertension, CAD s/p PCI to LAD 2023, OSA who is being seen 08/17/2023 for the evaluation of new onset atrial fibrillation with RVR at the request of Dr. Janalyn Whitaker.  History of Present Illness:   Daniel Whitaker presented to the ED with EMS on 3/17 for evaluation of increasing dyspnea, fever, cough, weakness/malaise, chest discomfort. In the ED was found to be in afib with HR up to the 170s. BP was noted to be soft as well. Labs largely unremarkable with normal WBC, troponin 11->25->41->41. Albumin low at 2.8. RVP negative for COVID, influenza, RSV. Patient's CXR noted small bilateral pleural effusions and stable bilateral rib fractures consistent with multiple myeloma. Given both rapid HR as well as borderline BP, cardiology was consulted and recommended starting patient on Amiodarone infusion with heparin pending formal consult today. Patient admitted to the ICU for further treatment of sepsis/pneumonia.   Subsequent ECGs and telemetry show NSR and due to bradycardia overnight, appears ICU NP advised discontinuation of Amiodarone. Today patient remains in sinus rhythm. He is very fatigued appearing, confirms above recent history. Per family at bedside/on the phone, patient with fluctuant fluid status and shortness of breath over the past few weeks (was admitted in February for dyspnea as well). Patient denies any awareness of irregular heart beat yesterday. Reports ongoing chest pain is not at all similar to symptoms felt prior to 2023 PCI. This pain is reproducible/patient with exquisitely tender chest wall on exam.   Cardiac  history is notable for dyspnea on exertion in 2023 that lead to a treadmill stress test. This showed ST depression and frequent PVCs. Left heart cath in May 2023 showed 85% prox LAD disease - DES was placed with 0% residual stenosis. Patient is followed by Dr. Antoine Whitaker, was last seen 05/10/23 at which time patient taken off Plavix "we can discontinue his Plavix as it has been over a year."  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   Nocturnal leg cramps 03/11/2021   Nontoxic uninodular goiter    Obesity    Peripheral neuropathy    RLS (restless legs syndrome)    Spinal stenosis    Unspecified deficiency anemia 07/11/2013    Past Surgical History:  Procedure Laterality Date   CARDIAC CATHETERIZATION     COLON RESECTION N/A 04/21/2013   Procedure: DIAGNOSTIC LAPAROSCOPY, lysis of adhesions for partial small bowel obstruction;  Surgeon: Daniel Pollack, MD;  Location: [facility] ORS;  Service: General;  Laterality: N/A;   COLON SURGERY     partial SBO    COLONOSCOPY     CORONARY STENT INTERVENTION N/A 10/22/2021   Procedure: CORONARY STENT INTERVENTION;  Surgeon: Daniel Lex, MD;  Location: [facility] INVASIVE CV LAB;  Service: Cardiovascular;  Laterality: N/A;   KNEE SURGERY  Arthroscopic   LEFT HEART CATH AND CORONARY ANGIOGRAPHY N/A 10/22/2021   Procedure: LEFT HEART CATH AND CORONARY ANGIOGRAPHY;  Surgeon: Daniel Lex, MD;  Location: [facility] INVASIVE CV LAB;  Service: Cardiovascular;  Laterality: N/A;   SMALL INTESTINE SURGERY     Blockage     Home Medications:  Prior to Admission medications   Medication Sig Start Date End Date Taking? Authorizing Provider  acyclovir (ZOVIRAX) 400 MG tablet  Take 1 tablet (400 mg total) by mouth daily. 06/09/23  Yes Gorsuch, Ni, MD  albuterol (VENTOLIN HFA) 108 (90 Base) MCG/ACT inhaler Inhale 1-2 puffs into the lungs every 6 (six) hours as needed for wheezing or shortness of breath.   Yes [provider]  aspirin EC 325 MG tablet Take 1 tablet (325 mg total) by mouth daily. 06/18/23  Yes Gorsuch, Ni, MD  brimonidine (ALPHAGAN) 0.2 % ophthalmic solution Place 1 drop into the left eye in the morning and at bedtime. 01/08/20  Yes [provider]  carvedilol (COREG) 3.125 MG tablet TAKE 1 TABLET BY MOUTH TWICE DAILY 05/24/23  Yes [name], MD  cetirizine (ZYRTEC) 10 MG tablet Take 10 mg by mouth every morning.   Yes [provider]  cyclobenzaprine (FLEXERIL) 10 MG tablet Take 10 mg by mouth daily as needed for muscle spasms.   Yes [provider]  DARZALEX FASPRO 1800-30000 MG-UT/15ML SOLN Inject 1,800 mg into the skin once a week. Every friday 08/04/23  Yes [provider]  dexamethasone (DECADRON) 4 MG tablet Take 1 pill daily except 4 pills every Friday Patient taking differently: Take 1 pill Sunday, Tuesday, Thursday. except 4 pills every Friday. Can take half a pill on Monday and Wednesday if needed 08/04/23  Yes Gorsuch, Ni, MD  docusate sodium (COLACE) 100 MG capsule Take 300 mg by mouth daily.   Yes [provider]  dorzolamide-timolol (COSOPT) 22.3-6.8 MG/ML ophthalmic solution Place 1 drop into both eyes 2 (two) times daily. 03/09/18  Yes [provider]  furosemide (LASIX) 40 MG tablet Take 40 mg in the morning and 20 mg in the afternoon 08/13/23  Yes Gorsuch, Ni, MD  ketoconazole (NIZORAL) 2 % cream Apply 1 application  topically daily as needed for irritation. 11/28/14  Yes [provider]  latanoprost (XALATAN) 0.005 % ophthalmic solution Place 1 drop into both eyes at bedtime. 03/01/13  Yes [provider]  lenalidomide (REVLIMID) 10 MG capsule Take 1 capsule (10 mg  total) by mouth daily. Take 1 capsule daily for 14 days on and 7 days off, repeat every 21 days. [employer] Auth #   [id number] Date Obtained 07/29/23 07/29/23  Yes [name], MD  lidocaine (XYLOCAINE) 5 % ointment Apply 1 Application topically 2 (two) times daily as needed. Patient taking differently: Apply 1 Application topically daily as needed for mild pain (pain score 1-3) or moderate pain (pain score 4-6) (for foot pain). 09/29/22  Yes Butch Penny, NP  morphine (MS CONTIN) 15 MG 12 hr tablet Take 15 mg by mouth at bedtime.   Yes [provider]  Multiple Vitamins-Minerals (CENTRUM SILVER 50+MEN) TABS Take 1 tablet by mouth daily with breakfast.   Yes [provider]  nitroGLYCERIN (NITROSTAT) 0.4 MG SL tablet Place 1 tablet (0.4 mg total) under the tongue every 5 (five) minutes as needed. Patient taking differently: Place 0.4 mg under the tongue every 5 (five) minutes as needed for chest pain. 10/22/21  Yes Arty Baumgartner, NP  ondansetron (ZOFRAN) 8 MG  tablet Take 1 tablet (8 mg total) by mouth every 8 (eight) hours as needed for nausea or vomiting. 05/11/23  Yes Gorsuch, Ni, MD  Oxycodone HCl 10 MG TABS Take 1 tablet (10 mg total) by mouth every 3 (three) hours as needed. Patient taking differently: Take 10 mg by mouth every 4 (four) hours. 08/04/23  Yes Gorsuch, Ni, MD  pantoprazole (PROTONIX) 40 MG tablet Take 40 mg by mouth daily before breakfast. 03/14/13  Yes [provider]  polyethylene glycol (MIRALAX / GLYCOLAX) 17 g packet Take 17 g by mouth 2 (two) times daily. 07/30/23  Yes Gorsuch, Ni, MD  pregabalin (LYRICA) 100 MG capsule TAKE 1 CAPSULE BY MOUTH IN THE MORNING, AND AT NOON, AND 2 CAPSULES AT NIGHT Patient taking differently: Take 100 mg by mouth in the morning and at bedtime. 100 mg in the AM and 200 mg in the PM (Also can have 100 mg at lunch) 02/17/23  Yes Ocie Doyne, MD  prochlorperazine (COMPAZINE) 10 MG tablet Take 1 tablet (10 mg total) by mouth  every 6 (six) hours as needed for nausea or vomiting. 05/11/23  Yes Gorsuch, Ni, MD  SSD 1 % cream Apply 1 Application topically daily. 08/09/23  Yes [provider]  triamcinolone (NASACORT) 55 MCG/ACT AERO nasal inhaler Place 2 sprays into the nose daily. Patient taking differently: Place 1 spray into the nose daily as needed. 06/03/23  Yes Gorsuch, Ni, MD  TYLENOL 500 MG tablet Take 1,000 mg by mouth every 6 (six) hours as needed for mild pain (pain score 1-3) or headache.   Yes [provider]  azithromycin (ZITHROMAX) 250 MG tablet Take 250 mg by mouth as directed. Patient not taking: Reported on 08/16/2023 05/24/23   [provider]  DENTA 5000 PLUS 1.1 % CREA dental cream Place 1 Application onto teeth at bedtime. 06/08/23   [provider]  levofloxacin (LEVAQUIN) 750 MG tablet Take 750 mg by mouth daily. Patient not taking: Reported on 08/16/2023 05/26/23   [provider]  rosuvastatin (CRESTOR) 40 MG tablet TAKE 1 TABLET BY MOUTH DAILY Patient not taking: Reported on 08/16/2023 08/09/23   [name], MD  senna-docusate (SENOKOT-S) 8.6-50 MG tablet Take 2 tablets by mouth 2 (two) times daily. 07/30/23   [name], MD  spironolactone (ALDACTONE) 25 MG tablet Take 1 tablet (25 mg total) by mouth in the morning. Patient not taking: Reported on 08/16/2023 07/21/23   [name], MD    Inpatient Medications: Scheduled Meds:  acyclovir  400 mg Oral Daily   aspirin EC  81 mg Oral Daily   azithromycin  500 mg Oral Daily   brimonidine  1 drop Left Eye BID   calcium-vitamin D  1 tablet Oral BID   carvedilol  3.125 mg Oral BID   Chlorhexidine Gluconate Cloth  6 each Topical Daily   [START ON 08/20/2023] dexamethasone  16 mg Oral Weekly   And   dexamethasone  4 mg Oral Once per day on Sunday Tuesday Thursday   And   [START ON 08/18/2023] dexamethasone  2 mg Oral Once per day on Monday Wednesday   docusate sodium  100 mg Oral BID    dorzolamide-timolol  1 drop Both Eyes BID   latanoprost  1 drop Both Eyes QHS   levalbuterol  0.63 mg Nebulization Q6H   morphine  15 mg Oral QHS   multivitamin with minerals  1 tablet Oral Q breakfast   pantoprazole  40 mg Oral QAC breakfast  polyethylene glycol  17 g Oral BID   pregabalin  100 mg Oral BID   senna-docusate  2 tablet Oral BID   sodium chloride flush  3 mL Intravenous Q12H   sodium chloride flush  3 mL Intravenous Q12H   triamcinolone  2 spray Nasal Daily   Continuous Infusions:  sodium chloride     ceFEPime (MAXIPIME) IV Stopped (08/17/23 0728)   heparin 1,800 Units/hr (08/17/23 0800)   lactated ringers 125 mL/hr at 08/17/23 0800   vancomycin     PRN Meds: sodium chloride, acetaminophen **OR** acetaminophen, alum & mag hydroxide-simeth, ipratropium, mouth rinse, oxyCODONE, perflutren lipid microspheres (DEFINITY) IV suspension, prochlorperazine, sodium chloride flush  Allergies:    Allergies  Allergen Reactions   Atorvastatin Other (See Comments)    Made the hands ACHE   Cymbalta [Duloxetine Hcl] Nausea Only and Other (See Comments)    Drowsiness, also   Penicillins Hives    Social History:   Social History   Socioeconomic History   Marital status: Married    Spouse name: Bonita Quin   Number of children: 2   Years of education: GED   Highest education level: Not on file  Occupational History   Occupation: still working  Tobacco Use   Smoking status: Former    Current packs/day: 0.00    Types: Cigarettes    Quit date: 06/01/1984    Years since quitting: 39.2   Smokeless tobacco: Never  Vaping Use   Vaping status: Never Used  Substance and Sexual Activity   Alcohol use: Not Currently    Alcohol/week: 10.0 standard drinks of alcohol    Types: 5 Glasses of wine, 5 Cans of beer per week   Drug use: No   Sexual activity: Not on file  Other Topics Concern   Not on file  Social History Narrative   Patient lives at home with his wife Bonita Quin)   Retired.     Patient has two children.   Patient is right-handed.   Patient has his GED.   Patient does not drink caffeine.   Social Drivers of Corporate investment banker Strain: Not on file  Food Insecurity: No Food Insecurity (08/17/2023)   Hunger Vital Sign    Worried About Running Out of Food in the Last Year: Never true    Ran Out of Food in the Last Year: Never true  Transportation Needs: No Transportation Needs (08/17/2023)   PRAPARE - Administrator, Civil Service (Medical): No    Lack of Transportation (Non-Medical): No  Physical Activity: Not on file  Stress: Not on file  Social Connections: Moderately Integrated (08/17/2023)   Social Connection and Isolation Panel [NHANES]    Frequency of Communication with Friends and Family: Once a week    Frequency of Social Gatherings with Friends and Family: Once a week    Attends Religious Services: 1 to 4 times per year    Active Member of [employer] or Organizations: No    Attends Banker Meetings: 1 to 4 times per year    Marital Status: Married  Catering manager Violence: Not At Risk (08/17/2023)   Humiliation, Afraid, Rape, and Kick questionnaire    Fear of Current or Ex-Partner: No    Emotionally Abused: No    Physically Abused: No    Sexually Abused: No    Family History:    Family History  Problem Relation Age of Onset   Brain cancer Mother    Lung cancer Father  Heart disease Brother        No details   Dementia Brother    Neuropathy Neg Hx    Colon cancer Neg Hx    Colon polyps Neg Hx    Esophageal cancer Neg Hx    Rectal cancer Neg Hx    Stomach cancer Neg Hx    Sleep apnea Neg Hx      ROS:  Please see the history of present illness.   All other ROS reviewed and negative.     Physical Exam/Data:   Vitals:   08/17/23 0800 08/17/23 0812 08/17/23 1000 08/17/23 1025  BP: (!) 130/58  (!) 122/47 (!) 130/45  Pulse: (!) 58  (!) 58 64  Resp: 16  13   Temp:      TempSrc:      SpO2: 97% 98%  98%   Weight:      Height:        Intake/Output Summary (Last 24 hours) at 08/17/2023 1030 Last data filed at 08/17/2023 0800 Gross per 24 hour  Intake 3637.6 ml  Output 400 ml  Net 3237.6 ml      08/17/2023    3:15 AM 08/16/2023    5:38 PM 08/13/2023    8:57 AM  Last 3 Weights  Weight (lbs) 218 lb 4.1 oz 222 lb 10.6 oz 224 lb 12.8 oz  Weight (kg) 99 kg 101 kg 101.969 kg     Body mass index is 29.6 kg/m.  General:  Tired and ill appearing. HEENT: normal Neck: no JVD Vascular: No carotid bruits; Distal pulses 2+ bilaterally Cardiac:  normal S1, S2; RRR; no murmur  Lungs:  clear to auscultation bilaterally, no wheezing, rhonchi or rales. Overall poor inspiratory effort Abd: soft, nontender, no hepatomegaly  Ext: no edema Musculoskeletal:  No deformities, BUE and BLE strength normal and equal Skin: warm and dry  Neuro:  CNs 2-12 intact, no focal abnormalities noted Psych:  Normal affect   EKG:  The EKG was personally reviewed and demonstrates:  sinus rhythm with PACs Telemetry:  Telemetry was personally reviewed and demonstrates:  Sinus rhythm with rates 50s-80s since approximately 2000 on 3/17. Rapid and irregular rhythm prior to conversion to NSR.  Relevant CV Studies:  07/20/23 Limited Echocardiogram  MPRESSIONS     1. Limited study for LV function; not all views obtained; MR not well  assessed.   2. Left ventricular ejection fraction, by estimation, is 60 to 65%. The  left ventricle has normal function. The left ventricle has no regional  wall motion abnormalities. Left ventricular diastolic parameters were  normal.   3. Right ventricular systolic function is normal. The right ventricular  size is normal.   4. The mitral valve is normal in structure. Mild to moderate mitral valve  regurgitation. No evidence of mitral stenosis.   5. The aortic valve is tricuspid. Aortic valve regurgitation is not  visualized. Aortic valve sclerosis is present, with no evidence of  aortic  valve stenosis.   6. Aortic dilatation noted. There is mild dilatation of the ascending  aorta, measuring 43 mm.   FINDINGS   Left Ventricle: Left ventricular ejection fraction, by estimation, is 60  to 65%. The left ventricle has normal function. The left ventricle has no  regional wall motion abnormalities. The left ventricular internal cavity  size was normal in size. There is   no left ventricular hypertrophy. Left ventricular diastolic parameters  were normal.   Right Ventricle: The right ventricular size is normal.  Right ventricular  systolic function is normal.   Left Atrium: Left atrial size was normal in size.   Right Atrium: Right atrial size was normal in size.   Pericardium: There is no evidence of pericardial effusion.   Mitral Valve: The mitral valve is normal in structure. Mild to moderate  mitral valve regurgitation. No evidence of mitral valve stenosis.   Tricuspid Valve: The tricuspid valve is normal in structure. Tricuspid  valve regurgitation is mild . No evidence of tricuspid stenosis.   Aortic Valve: The aortic valve is tricuspid. Aortic valve regurgitation is  not visualized. Aortic valve sclerosis is present, with no evidence of  aortic valve stenosis.   Pulmonic Valve: The pulmonic valve was normal in structure.   Aorta: Aortic dilatation noted. There is mild dilatation of the ascending  aorta, measuring 43 mm.   Additional Comments: Limited study for LV function; not all views  obtained; MR not well assessed.   10/22/21 LHC   Prox LAD to Mid LAD lesion is 85% stenosed.   A drug-eluting stent was successfully placed using a SYNERGY XD 2.50X12. ->  Deployed/postdilated to 2.8 mm   Post intervention, there is a 0% residual stenosis.   -------------------------------   The left ventricular systolic function is normal.  The left ventricular ejection fraction is 55-65% by visual estimate.   LV end diastolic pressure is normal.   Severe  single-vessel disease with 85% proximal LAD ->  Successful DES PCI using Synergy DES 2.5 mm x 12 mm postdilated/deployed to 2.8 mm.   TIMI-3 flow pre and post.  Reduced to 0%. Otherwise angiographically normal coronary arteries. Normal LVEF with normal EDP.   Laboratory Data:  High Sensitivity Troponin:   Recent Labs  Lab 07/25/23 1133 08/16/23 1757 08/16/23 2042 08/17/23 0037 08/17/23 0303  TROPONINIHS 5 11 25* 41* 41*     Chemistry Recent Labs  Lab 08/13/23 0839 08/16/23 1757 08/17/23 0037  NA 139 138 136  K 4.4 3.9 3.5  CL 105 103 103  CO2 28 21* 22  GLUCOSE 99 178* 134*  BUN 30* 38* 33*  CREATININE 0.74 0.92 0.68  CALCIUM 9.5 9.4 8.9  MG  --  1.9  --   GFRNONAA >60 >60 >60  ANIONGAP 6 14 11     Recent Labs  Lab 08/13/23 0839 08/16/23 1757 08/17/23 0037  PROT 7.6 8.0 6.9  ALBUMIN 3.3* 2.9* 2.5*  AST 10* 23 11*  ALT 16 18 15   ALKPHOS 107 93 75  BILITOT 0.3 0.8 0.6   Lipids No results for input(s): "CHOL", "TRIG", "HDL", "LABVLDL", "LDLCALC", "CHOLHDL" in the last 168 hours.  Hematology Recent Labs  Lab 08/13/23 0839 08/16/23 1757 08/17/23 0037  WBC 7.9 8.7 7.8  RBC 3.33* 3.69* 3.07*  HGB 9.2* 10.4* 8.5*  HCT 29.7* 33.0* 28.8*  MCV 89.2 89.4 93.8  MCH 27.6 28.2 27.7  MCHC 31.0 31.5 29.5*  RDW 17.1* 17.6* 17.8*  PLT 254 268 238   Thyroid No results for input(s): "TSH", "FREET4" in the last 168 hours.  BNPNo results for input(s): "BNP", "PROBNP" in the last 168 hours.  DDimer No results for input(s): "DDIMER" in the last 168 hours.   Radiology/Studies:  DG Chest Port 1 View Result Date: 08/16/2023 CLINICAL DATA:  Shortness of breath and fever. Current chemotherapy. History of multiple myeloma previous exam. EXAM: PORTABLE CHEST 1 VIEW COMPARISON:  07/25/2023, 07/26/2023. FINDINGS: The heart is enlarged and mediastinal contours is stable. Lung volumes are low with patchy airspace  disease at the lung bases, increased from the prior exam. There are  small bilateral pleural effusions. No pneumothorax is seen. Multiple lytic lesions are seen within the bones. Bilateral rib fractures are noted and unchanged from the previous exam. IMPRESSION: 1. Increased patchy airspace disease at the lung bases, possible atelectasis or infiltrate. 2. Small bilateral pleural effusions. 3. Stable lytic lesions throughout the bones with bilateral rib fractures and compatible with history of multiple myeloma. Electronically Signed   By: Thornell Sartorius M.D.   On: 08/16/2023 19:29     Assessment and Plan:   New onset atrial fibrillation  Patient with multiple myeloma presented to the ED with acute on chronic dyspnea as well as fever >103F and malaise. Found to have rapid and irregular rhythm in the ED, HR as high as 170s on telemetry, concerning for afib with RVR. Given borderline BP and sepsis criteria being met, patient was started on Amiodarone and heparin. Telemetry shows conversion to NSR around 2000. Amiodarone discontinued overnight due to bradycardia. Suspect that bradycardia may be been post conversion phenomenon. Since amiodarone has already been stopped, would not resume. Afib in the setting of acute illness not unexpected. Discussed with patient/family that his hx OSA is also a risk factor (sounds like he has not been using his CPAP as regularly recently).  Continue home Coreg 3.125mg  BID. If patient were to have recurrence of tachycardia, would favor Metoprolol over Coreg to allow for increased beta blockade without hypotension. Regarding anticoagulation, patient's CHADs2VASc score is 5. However, CBC notable for acute on chronic anemia with HGB 8.5. With elevated stroke risk, OAC benefits would seem to outweigh risks at the current time.  Will switch from heparin drip to Eliquis Echocardiogram pending, will follow.  Check TSH  Atypical chest pain Patient with progressive multiple myeloma reports chronic peristernal chest pain made worse with palpation,  movement, deep breaths. Denies any similarity to chest pain felt prior to 2023 PCI. Chest pain very atypical, not consistent with ACS but rather with rib metastasis. Troponin elevation to 41 in the setting of afib with RVR and sepsis with pneumonia is consistent with type II MI/demand ischemia.  CAD Patient s/p PCI to LAD in 2023. Denies recent anginal symptoms. Per hematology, has been on ASA 325mg  for DVT prophylaxis.  Given time since stent placement, patient no longer requires DAPT. Safety profile of ASA vs Plavix actually favors Plavix. If decision made to continue patient on OAC with new afib, would favor Plavix monotherapy over ASA.  Continue Coreg. Continue Lipitor.  Acute on chronic hypoxic respiratory failure Reactive airway disease Patient with recently fluctuations in respiratory status, seemingly associated with fluid retention/weight gain with chronic steroid use. Chronically sleeps in a recliner both due to breathing as well as for pain management/comfort.  Acute on chronic resp failure likely multi-factorial. Physical exam today is not consistent with acute CHF. Will follow echocardiogram that has been ordered and check TSH/BNP. Question if progressive multiple myeloma with rib fractures is leading to shallow respirations/hypoventilation.  Use caution with sepsis protocol fluid administration, okay to continue holding Spironolactone and Lasix. Pneumonia and reactive airway disease management per primary team.  Hypertension Patient with low normal BP earlier this admission, up to 130/45 mmHg today.  Continue Coreg 3.125mg  BID. Resume spironolactone when able this admission  OSA Patient appears to have been using CPAP irregularly in recent weeks since requiring more frequent O2.  Given association of OSA/afib, recommend nightly use with O2 bleed in.  Risk Assessment/Risk Scores:        New York Heart Association (NYHA) Functional Class NYHA Class  III  CHA2DS2-VASc Score = 5   This indicates a 7.2% annual risk of stroke. The patient's score is based upon: CHF History: 1 HTN History: 1 Diabetes History: 0 Stroke History: 0 Vascular Disease History: 1 Age Score: 2 Gender Score: 0       For questions or updates, please contact [facility] Please consult [email or web address] for contact info under    Signed, Yancey Pedley, PA-C  08/17/2023 10:30 AM  Patient seen and examined.  Agree with above documentation.  Daniel Whitaker is a 78 year old adult with history of multiple myeloma, CAD status post PCI to LAD in 2023, chronic hypoxic respiratory failure, hypertension, OSA who are consulted for evaluation of atrial fibrillation with RVR at the request of Dr. Janalyn Whitaker.  Patient has history of CAD, underwent LHC 09/2020 3 which showed 85% proximal LAD stenosis status post DES.  Follows with Dr. Antoine Whitaker.  Presented to ED yesterday with worsening shortness of breath, fever, and cough.  In ED was noted to be in A-fib with RVR with rates up to 170s.  Mild troponin elevation (peak 41), creatinine 0.9, lactate 1.8, WBC 8.7, hemoglobin 10.4.  Chest x-ray showed increased patchy airspace disease at lung bases, possibly atelectasis or infiltrate, small bilateral pleural effusions.  EKG showed atrial fibrillation with rate 181.  Converted to sinus rhythm overnight, recent EKG shows sinus rhythm, rate 88.  Echocardiogram shows EF 60 to 65%, normal RV function, severe left atrial enlargement, mild mitral regurgitation.On exam, patient is alert and oriented, regular rate and rhythm, no murmurs, lungs CTAB, no LE edema or JVD.  For the patient's atrial fibrillation, suspect this was triggered by acute illness, being treated for pneumonia.  Maintaining sinus rhythm since last night.  CHA2DS2-VASc 5.  Will switch from heparin drip to Eliquis.  Can discontinue aspirin.  Continue carvedilol.  If recurrent A-fib, can consider loading with amiodarone to maintain  sinus rhythm while recovers from acute illness, would not plan long-term amiodarone use.  Little Ishikawa, MD

## 2023-08-17 NOTE — Hospital Course (Signed)
 Daniel Whitaker is a 78 y.o. adult with a history of chronic respiratory failure, multiple myeloma with bone involvement, anemia of chronic disease, hypertension, CAD s/p stent, hyperlipidemia, OSA, glaucoma.  Patient presented secondary to fever and shortness of breath and found to have evidence of sepsis secondary to pneumonia, complicated by atrial fibrillation with RVR. Antibiotics started and amiodarone/heparin started for atrial fibrillation with reversion to sinus rhythm. Cardiology consulted.

## 2023-08-17 NOTE — Plan of Care (Signed)
  Problem: Activity: Goal: Ability to tolerate increased activity will improve Outcome: Progressing   Problem: Clinical Measurements: Goal: Ability to maintain a body temperature in the normal range will improve Outcome: Progressing   Problem: Respiratory: Goal: Ability to maintain adequate ventilation will improve Outcome: Progressing Goal: Ability to maintain a clear airway will improve Outcome: Progressing   

## 2023-08-17 NOTE — Progress Notes (Signed)
 PHARMACY - ANTICOAGULATION CONSULT NOTE  Pharmacy Consult for Heparin, Apixaban Indication: atrial fibrillation  Allergies  Allergen Reactions   Atorvastatin Other (See Comments)    Made the hands ACHE   Cymbalta [Duloxetine Hcl] Nausea Only and Other (See Comments)    Drowsiness, also   Penicillins Hives    Patient Measurements: Height: 6' (182.9 cm) Weight: 99 kg (218 lb 4.1 oz) (with x2 extra pillows) IBW/kg (Calculated) : 77.6 Heparin Dosing Weight: 98 kg  Vital Signs: Temp: 97.8 F (36.6 C) (03/18 0300) Temp Source: Oral (03/18 0300) BP: 112/36 (03/18 0630) Pulse Rate: 56 (03/18 0630)  Labs: Recent Labs    08/16/23 1757 08/16/23 2042 08/17/23 0037 08/17/23 0303  HGB 10.4*  --  8.5*  --   HCT 33.0*  --  28.8*  --   PLT 268  --  238  --   APTT 30  --   --   --   LABPROT 15.8*  --   --   --   INR 1.2  --   --   --   HEPARINUNFRC  --   --   --  <0.10*  CREATININE 0.92  --  0.68  --   TROPONINIHS 11 25* 41* 41*    Estimated Creatinine Clearance (by C-G formula based on SCr of 0.68 mg/dL) Male: 29.5 mL/min Male: 94.3 mL/min   Medical History: 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   Nocturnal leg cramps 03/11/2021   Nontoxic uninodular goiter    Obesity    Peripheral neuropathy    RLS (restless legs syndrome)    Spinal stenosis    Unspecified deficiency anemia 07/11/2013    Medications:  Scheduled:   acyclovir  400 mg Oral Daily   brimonidine  1 drop Left Eye BID   calcium-vitamin D  1 tablet Oral BID   carvedilol  3.125 mg Oral BID   Chlorhexidine Gluconate Cloth  6 each Topical Daily   clopidogrel  75 mg Oral Daily   [START ON 08/20/2023]  dexamethasone  16 mg Oral Daily   dexamethasone  4 mg Oral QODAY   docusate sodium  100 mg Oral BID   dorzolamide-timolol  1 drop Both Eyes BID   latanoprost  1 drop Both Eyes QHS   levalbuterol  0.63 mg Nebulization Q6H   morphine  15 mg Oral QHS   multivitamin with minerals  1 tablet Oral Q breakfast   pantoprazole  40 mg Oral QAC breakfast   polyethylene glycol  17 g Oral BID   pregabalin  100 mg Oral BID   senna-docusate  2 tablet Oral BID   sodium chloride flush  3 mL Intravenous Q12H   sodium chloride flush  3 mL Intravenous Q12H   triamcinolone  2 spray Nasal Daily   Infusions:   sodium chloride     amiodarone     ceFEPime (MAXIPIME) IV 2 g (08/17/23 0658)   heparin 1,800 Units/hr (08/17/23 0400)   lactated ringers 125 mL/hr at 08/17/23 0400   vancomycin      Assessment: 77 yoM admitted on 3/17 with possible sepsis.  PMH multiple myeloma currently on  chemo, CAD, OSA.  Noted to be in atrial fibrillation in ED. Pharmacy initially consulted to dose IV heparin, now transitioning to apixaban.   Today, 08/17/2023: Heparin level 0.14, subtherapeutic on heparin 1800 units/hr CBC:  Hgb decreased to 8.5 (near baseline), Plt WNL No bleeding or complications reported     Goal of Therapy:  Heparin level 0.3-0.7 units/ml aPTT 66-102 seconds Monitor platelets by anticoagulation protocol: Yes   Plan:  D/C Heparin Start apixaban 5mg  PO BID Pharmacy to provide education prior to discharge.   Lynann Beaver PharmD, BCPS [facility] main pharmacy [phone] 08/17/2023 1:13 PM

## 2023-08-18 ENCOUNTER — Other Ambulatory Visit (HOSPITAL_COMMUNITY): Payer: Self-pay

## 2023-08-18 ENCOUNTER — Telehealth (HOSPITAL_COMMUNITY): Payer: Self-pay | Admitting: Pharmacy Technician

## 2023-08-18 DIAGNOSIS — C9000 Multiple myeloma not having achieved remission: Secondary | ICD-10-CM | POA: Diagnosis not present

## 2023-08-18 DIAGNOSIS — I2489 Other forms of acute ischemic heart disease: Secondary | ICD-10-CM | POA: Diagnosis not present

## 2023-08-18 DIAGNOSIS — I4891 Unspecified atrial fibrillation: Secondary | ICD-10-CM | POA: Diagnosis not present

## 2023-08-18 LAB — COMPREHENSIVE METABOLIC PANEL
ALT: 14 U/L (ref 0–44)
AST: 12 U/L — ABNORMAL LOW (ref 15–41)
Albumin: 2.6 g/dL — ABNORMAL LOW (ref 3.5–5.0)
Alkaline Phosphatase: 77 U/L (ref 38–126)
Anion gap: 10 (ref 5–15)
BUN: 27 mg/dL — ABNORMAL HIGH (ref 8–23)
CO2: 24 mmol/L (ref 22–32)
Calcium: 9.4 mg/dL (ref 8.9–10.3)
Chloride: 104 mmol/L (ref 98–111)
Creatinine, Ser: 0.75 mg/dL (ref 0.61–1.24)
GFR, Estimated: >60 mL/min (ref >60)
Glucose, Bld: 97 mg/dL (ref 70–99)
Potassium: 3.4 mmol/L — ABNORMAL LOW (ref 3.5–5.1)
Sodium: 138 mmol/L (ref 135–145)
Total Bilirubin: 0.5 mg/dL (ref 0.0–1.2)
Total Protein: 7.2 g/dL (ref 6.5–8.1)

## 2023-08-18 LAB — CBC
HCT: 26.9 % — ABNORMAL LOW (ref 39.0–52.0)
Hemoglobin: 8.2 g/dL — ABNORMAL LOW (ref 13.0–17.0)
MCH: 27.8 pg (ref 26.0–34.0)
MCHC: 30.5 g/dL (ref 30.0–36.0)
MCV: 91.2 fL (ref 80.0–100.0)
Platelets: 235 10*3/uL (ref 150–400)
RBC: 2.95 MIL/uL — ABNORMAL LOW (ref 4.22–5.81)
RDW: 17.3 % — ABNORMAL HIGH (ref 11.5–15.5)
WBC: 5.6 10*3/uL (ref 4.0–10.5)
nRBC: 0 % (ref 0.0–0.2)

## 2023-08-18 LAB — MAGNESIUM: Magnesium: 2 mg/dL (ref 1.7–2.4)

## 2023-08-18 MED ORDER — BRIMONIDINE TARTRATE 0.2 % OP SOLN
1.0000 [drp] | Freq: Three times a day (TID) | OPHTHALMIC | Status: DC
  Administered 2023-08-18 – 2023-08-20 (×7): 1 [drp] via OPHTHALMIC
  Filled 2023-08-18: qty 5

## 2023-08-18 MED ORDER — FUROSEMIDE 40 MG PO TABS
40.0000 mg | ORAL_TABLET | Freq: Every day | ORAL | Status: DC
  Administered 2023-08-18: 40 mg via ORAL
  Filled 2023-08-18: qty 1

## 2023-08-18 MED ORDER — FUROSEMIDE 10 MG/ML IJ SOLN
40.0000 mg | Freq: Every day | INTRAMUSCULAR | Status: DC
  Administered 2023-08-19: 40 mg via INTRAVENOUS
  Filled 2023-08-18: qty 4

## 2023-08-18 MED ORDER — POTASSIUM CHLORIDE CRYS ER 20 MEQ PO TBCR
40.0000 meq | EXTENDED_RELEASE_TABLET | Freq: Once | ORAL | Status: AC
  Administered 2023-08-18: 40 meq via ORAL
  Filled 2023-08-18: qty 2

## 2023-08-18 MED ORDER — ROSUVASTATIN CALCIUM 20 MG PO TABS
40.0000 mg | ORAL_TABLET | Freq: Every day | ORAL | Status: DC
  Administered 2023-08-18 – 2023-08-20 (×3): 40 mg via ORAL
  Filled 2023-08-18 (×3): qty 2

## 2023-08-18 MED ORDER — PREGABALIN 100 MG PO CAPS
100.0000 mg | ORAL_CAPSULE | Freq: Every day | ORAL | Status: DC
  Administered 2023-08-18 – 2023-08-20 (×3): 100 mg via ORAL
  Filled 2023-08-18 (×3): qty 1

## 2023-08-18 MED ORDER — PREGABALIN 75 MG PO CAPS
200.0000 mg | ORAL_CAPSULE | Freq: Every day | ORAL | Status: DC
Start: 2023-08-18 — End: 2023-08-20
  Administered 2023-08-18 – 2023-08-19 (×2): 200 mg via ORAL
  Filled 2023-08-18 (×2): qty 2

## 2023-08-18 NOTE — Progress Notes (Signed)
 PROGRESS NOTE    Daniel Whitaker  YQM:[id number] DOB: 02-14-1946 DOA: 08/16/2023 PCP: [name], MD    Brief Narrative:77 y.o. adult with a history of chronic respiratory failure, multiple myeloma with bone involvement, anemia of chronic disease, hypertension, CAD s/p stent, hyperlipidemia, OSA, glaucoma. Patient presented secondary to fever and shortness of breath and found to have evidence of sepsis secondary to pneumonia, complicated by atrial fibrillation with RVR. Antibiotics started and amiodarone/heparin started for atrial fibrillation with reversion to sinus rhythm. Cardiology consulted.   Assessment & Plan:   Principal Problem:   Atrial fibrillation with RVR (HCC) Active Problems:   Multiple myeloma (HCC)   Cancer related pain   Sepsis due to pneumonia (HCC)   Chronic hypoxic respiratory failure (HCC)   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)   Obstructive sleep apnea   Anemia of chronic disease   Essential hypertension   Acute on chronic hypoxic respiratory failure (HCC)   Reactive airway disease   Demand ischemia (HCC)   Atrial fibrillation Newly diagnosed this admission.  Likely related to acute illness. Patient started on amiodarone and heparin with eventual cardioversion back to sinus rhythm. Amiodarone discontinued. Cardiology consulted. Transthoracic Echocardiogram significant for LVEF of 60-65% with severely dilated left atrium. -Cardiology recommendations: Coreg, Eliquis -Continue telemetry -TSH normal    Hypokalemia replete check mag  Chest pain This appears to be related to known multiple myeloma   Sepsis Present on admission. Secondary to pneumonia. Blood cultures obtained on admission. Vancomycin and cefepime started empirically. MRSA PCR negative. -Continue Cefepime -Discontinue Vancomycin -Add azithromycin -Follow-up blood cultures negative   Community acquired pneumonia Present on admission. Chest x-ray  significant for patchy airspace disease.   Multiple myeloma Patient follows with oncology as an outpatient. Patient with bone involvement, including lesions leading to bilateral rib fractures.   Chronic pain Secondary to bone lesions and fractures. Patient is on dexamethasone, Morphine and oxycodone -Continue outpatient regimen   Aortic dilation Noted on Transthoracic Echocardiogram, measuring 39 mm.   Elevated troponin In setting of atrial fibrillation with RVR and sepsis. Likely demand ischemia.   Primary hypertension -Continue Coreg  CAD History of coronary artery stent placement. Patient is managed on Coreg, aspirin, Plavix and Lipitor as an outpatient.   Pressure injury Medial coccyx. Present on admission.  Pressure Injury 08/16/23 Buttocks Right;Left Stage 2 -  Partial thickness loss of dermis presenting as a shallow open injury with a red, pink wound bed without slough. (Active)  08/16/23 2200  Location: Buttocks  Location Orientation: Right;Left  Staging: Stage 2 -  Partial thickness loss of dermis presenting as a shallow open injury with a red, pink wound bed without slough.  Wound Description (Comments):   Present on Admission: Yes  Dressing Type Foam - Lift dressing to assess site every shift 08/18/23 0800    Estimated body mass index is 29.6 kg/m as calculated from the following:   Height as of this encounter: 6' (1.829 m).   Weight as of this encounter: 99 kg.   DVT prophylaxis: Eliquis Code Status:   Code Status: Full Code Family Communication: Grandson at bedside  Disposition Plan: Discharge home pending culture data and transition to outpatient antibiotics     Consultants:  Cardiology oncology   Procedures:  Transthoracic Echocardiogram   Antimicrobials: Cefepime Azithromycin     Subjective:  K 3.4 mag pending Lyrica dose adjusted to home dose Requesting for lasix to be restarted as weight is up Objective: Vitals:  08/18/23 0859  08/18/23 1000 08/18/23 1130 08/18/23 1200  BP: (!) 139/53 (!) 137/50  (!) 139/58  Pulse:  (!) 56  62  Resp:  15  18  Temp:   (!) 97.5 F (36.4 C)   TempSrc:   Oral   SpO2:  96%  97%  Weight:      Height:        Intake/Output Summary (Last 24 hours) at 08/18/2023 1338 Last data filed at 08/18/2023 1200 Gross per 24 hour  Intake 2217.18 ml  Output 3760 ml  Net -1542.82 ml   Filed Weights   08/16/23 1738 08/17/23 0315  Weight: 101 kg 99 kg    Examination:  General exam: Appears anxious  Respiratory system: Clear to auscultation. Respiratory effort normal. Cardiovascular system: S1 & S2 heard, RRR. No JVD, murmurs, rubs, gallops or clicks. No pedal edema. Gastrointestinal system: Abdomen is nondistended, soft and nontender. No organomegaly or masses felt. Normal bowel sounds heard. Central nervous system: Alert and oriented. No focal neurological deficits. Extremities: Symmetric 5 x 5 power. Skin: No rashes, lesions or ulcers Psychiatry: Judgement and insight appear normal. Mood & affect appropriate.     Data Reviewed: I have personally reviewed following labs and imaging studies  CBC: Recent Labs  Lab 08/13/23 0839 08/16/23 1757 08/17/23 0037 08/18/23 0741  WBC 7.9 8.7 7.8 5.6  NEUTROABS 5.1  --   --   --   HGB 9.2* 10.4* 8.5* 8.2*  HCT 29.7* 33.0* 28.8* 26.9*  MCV 89.2 89.4 93.8 91.2  PLT 254 268 238 235   Basic Metabolic Panel: Recent Labs  Lab 08/13/23 0839 08/16/23 1757 08/17/23 0037 08/18/23 0741  NA 139 138 136 138  K 4.4 3.9 3.5 3.4*  CL 105 103 103 104  CO2 28 21* 22 24  GLUCOSE 99 178* 134* 97  BUN 30* 38* 33* 27*  CREATININE 0.74 0.92 0.68 0.75  CALCIUM 9.5 9.4 8.9 9.4  MG  --  1.9  --   --    GFR: Estimated Creatinine Clearance (by C-G formula based on SCr of 0.75 mg/dL) Male: 72.5 mL/min Male: 94.3 mL/min Liver Function Tests: Recent Labs  Lab 08/13/23 0839 08/16/23 1757 08/17/23 0037 08/18/23 0741  AST 10* 23 11* 12*  ALT 16  18 15 14   ALKPHOS 107 93 75 77  BILITOT 0.3 0.8 0.6 0.5  PROT 7.6 8.0 6.9 7.2  ALBUMIN 3.3* 2.9* 2.5* 2.6*   No results for input(s): "LIPASE", "AMYLASE" in the last 168 hours. No results for input(s): "AMMONIA" in the last 168 hours. Coagulation Profile: Recent Labs  Lab 08/16/23 1757  INR 1.2   Cardiac Enzymes: No results for input(s): "CKTOTAL", "CKMB", "CKMBINDEX", "TROPONINI" in the last 168 hours. BNP (last 3 results) No results for input(s): "PROBNP" in the last 8760 hours. HbA1C: No results for input(s): "HGBA1C" in the last 72 hours. CBG: No results for input(s): "GLUCAP" in the last 168 hours. Lipid Profile: No results for input(s): "CHOL", "HDL", "LDLCALC", "TRIG", "CHOLHDL", "LDLDIRECT" in the last 72 hours. Thyroid Function Tests: Recent Labs    08/17/23 1219  TSH 0.452   Anemia Panel: No results for input(s): "VITAMINB12", "FOLATE", "FERRITIN", "TIBC", "IRON", "RETICCTPCT" in the last 72 hours. Sepsis Labs: Recent Labs  Lab 08/16/23 1816 08/16/23 2045 08/16/23 2049 08/17/23 0037  PROCALCITON  --  0.40  --   --   LATICACIDVEN 1.8  --  1.3 0.9    Recent Results (from the past 240 hours)  Blood Culture (routine x 2)     Status: None (Preliminary result)   Collection Time: 08/16/23  5:57 PM   Specimen: BLOOD LEFT HAND  Result Value Ref Range Status   Specimen Description   Final    BLOOD LEFT HAND Performed at [facility], [address], [place], [place] [address]    Special Requests   Final    BOTTLES DRAWN AEROBIC AND ANAEROBIC Blood Culture adequate volume Performed at [facility], [address], [place], [place] [address]    Culture   Final    NO GROWTH 2 DAYS Performed at [facility], [address] [place], [place] [address]    Report Status PENDING  Incomplete  Resp panel by RT-PCR (RSV, Flu A&B, Covid) Anterior Nasal Swab     Status: None   Collection Time: 08/16/23  6:27 PM   Specimen: Anterior  Nasal Swab  Result Value Ref Range Status   SARS Coronavirus 2 by RT PCR NEGATIVE NEGATIVE Final    Comment: (NOTE) SARS-CoV-2 target nucleic acids are NOT DETECTED.  The SARS-CoV-2 RNA is generally detectable in upper respiratory specimens during the acute phase of infection. The lowest concentration of SARS-CoV-2 viral copies this assay can detect is 138 copies/mL. A negative result does not preclude SARS-Cov-2 infection and should not be used as the sole basis for treatment or other patient management decisions. A negative result may occur with  improper specimen collection/handling, submission of specimen other than nasopharyngeal swab, presence of viral mutation(s) within the areas targeted by this assay, and inadequate number of viral copies(<138 copies/mL). A negative result must be combined with clinical observations, patient history, and epidemiological information. The expected result is Negative.  Fact Sheet for Patients:  BloggerCourse.com  Fact Sheet for Healthcare Providers:  SeriousBroker.it  This test is no t yet approved or cleared by the [place] FDA and  has been authorized for detection and/or diagnosis of SARS-CoV-2 by FDA under an Emergency Use Authorization (EUA). This EUA will remain  in effect (meaning this test can be used) for the duration of the COVID-19 declaration under Section 564(b)(1) of the Act, 21 U.S.C.section 360bbb-3(b)(1), unless the authorization is terminated  or revoked sooner.       Influenza A by PCR NEGATIVE NEGATIVE Final   Influenza B by PCR NEGATIVE NEGATIVE Final    Comment: (NOTE) The Xpert Xpress SARS-CoV-2/FLU/RSV plus assay is intended as an aid in the diagnosis of influenza from Nasopharyngeal swab specimens and should not be used as a sole basis for treatment. Nasal washings and aspirates are unacceptable for Xpert Xpress SARS-CoV-2/FLU/RSV testing.  Fact Sheet for  Patients: BloggerCourse.com  Fact Sheet for Healthcare Providers: SeriousBroker.it  This test is not yet approved or cleared by the [place] FDA and has been authorized for detection and/or diagnosis of SARS-CoV-2 by FDA under an Emergency Use Authorization (EUA). This EUA will remain in effect (meaning this test can be used) for the duration of the COVID-19 declaration under Section 564(b)(1) of the Act, 21 U.S.C. section 360bbb-3(b)(1), unless the authorization is terminated or revoked.     Resp Syncytial Virus by PCR NEGATIVE NEGATIVE Final    Comment: (NOTE) Fact Sheet for Patients: BloggerCourse.com  Fact Sheet for Healthcare Providers: SeriousBroker.it  This test is not yet approved or cleared by the [place] FDA and has been authorized for detection and/or diagnosis of SARS-CoV-2 by FDA under an Emergency Use Authorization (EUA). This EUA will  remain in effect (meaning this test can be used) for the duration of the COVID-19 declaration under Section 564(b)(1) of the Act, 21 U.S.C. section 360bbb-3(b)(1), unless the authorization is terminated or revoked.  Performed at [facility], [address] [place], [place] [address]   MRSA Next Gen by PCR, Nasal     Status: None   Collection Time: 08/16/23 10:30 PM   Specimen: Nasal Mucosa; Nasal Swab  Result Value Ref Range Status   MRSA by PCR Next Gen NOT DETECTED NOT DETECTED Final    Comment: (NOTE) The GeneXpert MRSA Assay (FDA approved for NASAL specimens only), is one component of a comprehensive MRSA colonization surveillance program. It is not intended to diagnose MRSA infection nor to guide or monitor treatment for MRSA infections. Test performance is not FDA approved in patients less than 37 years old. Performed at [facility], [address] [place], [place]  [address]   Blood Culture (routine x 2)     Status: None (Preliminary result)   Collection Time: 08/17/23 12:37 AM   Specimen: BLOOD RIGHT ARM  Result Value Ref Range Status   Specimen Description   Final    BLOOD RIGHT ARM Performed at [facility], [address], [place], [place] [address]    Special Requests   Final    BOTTLES DRAWN AEROBIC ONLY Blood Culture adequate volume Performed at [facility], [address] [place], [place] [address]    Culture   Final    NO GROWTH 1 DAY Performed at [facility], [address], [place], [place] [address]    Report Status PENDING  Incomplete         Radiology Studies: ECHOCARDIOGRAM COMPLETE Result Date: 08/17/2023    ECHOCARDIOGRAM REPORT   Patient Name:   Daniel Whitaker Date of Exam: 08/17/2023 Medical Rec #:  [id number]        Height:       72.0 in Accession #:    [id number]       Weight:       218.3 lb Date of Birth:  February 25, 1946       BSA:          2.211 m Patient Age:    77 years         BP:           130/51 mmHg Patient Gender: M                HR:           63 bpm. Exam Location:  Inpatient Procedure: 2D Echo, Cardiac Doppler, Color Doppler and Intracardiac            Opacification Agent (Both Spectral and Color Flow Doppler were            utilized during procedure). Indications:    A fib  History:        Patient has prior history of Echocardiogram examinations, most                 recent 07/20/2023. CAD, Arrythmias:Atrial Fibrillation; Risk                 Factors:Hypertension.  Sonographer:    Amy Chionchio Referring Phys: [id number] SUBRINA SUNDIL IMPRESSIONS  1. Left ventricular ejection fraction, by estimation, is 60 to 65%. The left ventricle has normal function. The left ventricle has no regional wall motion abnormalities. Left ventricular diastolic parameters were  normal.  2. Right ventricular systolic function is normal. The right ventricular size is normal.  3. Left atrial size was  severely dilated.  4. The mitral valve is normal in structure. Mild mitral valve regurgitation. No evidence of mitral stenosis.  5. The aortic valve is tricuspid. Aortic valve regurgitation is not visualized. Aortic valve sclerosis is present, with no evidence of aortic valve stenosis.  6. Aortic dilatation noted. There is mild dilatation of the ascending aorta, measuring 39 mm. FINDINGS  Left Ventricle: Left ventricular ejection fraction, by estimation, is 60 to 65%. The left ventricle has normal function. The left ventricle has no regional wall motion abnormalities. Definity contrast agent was given IV to delineate the left ventricular  endocardial borders. The left ventricular internal cavity size was normal in size. There is no left ventricular hypertrophy. Left ventricular diastolic parameters were normal. Right Ventricle: The right ventricular size is normal. Right ventricular systolic function is normal. Left Atrium: Left atrial size was severely dilated. Right Atrium: Right atrial size was normal in size. Pericardium: There is no evidence of pericardial effusion. Mitral Valve: The mitral valve is normal in structure. Mild mitral valve regurgitation. No evidence of mitral valve stenosis. MV peak gradient, 4.2 mmHg. The mean mitral valve gradient is 1.0 mmHg. Tricuspid Valve: The tricuspid valve is normal in structure. Tricuspid valve regurgitation is mild . No evidence of tricuspid stenosis. Aortic Valve: The aortic valve is tricuspid. Aortic valve regurgitation is not visualized. Aortic valve sclerosis is present, with no evidence of aortic valve stenosis. Aortic valve mean gradient measures 4.0 mmHg. Aortic valve peak gradient measures 8.5  mmHg. Aortic valve area, by VTI measures 2.66 cm. Pulmonic Valve: The pulmonic valve was not well visualized. Pulmonic valve regurgitation is not visualized. No evidence of pulmonic stenosis. Aorta: Aortic dilatation noted. There is mild dilatation of the ascending  aorta, measuring 39 mm. Venous: The inferior vena cava was not well visualized. IAS/Shunts: The interatrial septum was not well visualized.  LEFT VENTRICLE PLAX 2D LVIDd:         5.00 cm      Diastology LVIDs:         2.80 cm      LV e' medial:    7.62 cm/s LV PW:         0.80 cm      LV E/e' medial:  11.9 LV IVS:        0.80 cm      LV e' lateral:   9.90 cm/s LVOT diam:     2.10 cm      LV E/e' lateral: 9.2 LV SV:         80 LV SV Index:   36 LVOT Area:     3.46 cm  LV Volumes (MOD) LV vol d, MOD A4C: 195.0 ml LV vol s, MOD A4C: 82.6 ml LV SV MOD A4C:     195.0 ml RIGHT VENTRICLE RV Basal diam:  5.10 cm TAPSE (M-mode): 2.0 cm LEFT ATRIUM              Index        RIGHT ATRIUM           Index LA Vol (A2C):   162.0 ml 73.27 ml/m  RA Area:     21.80 cm LA Vol (A4C):   146.5 ml 66.26 ml/m  RA Volume:   67.00 ml  30.30 ml/m LA Biplane Vol: 150.0 ml 67.84 ml/m  AORTIC VALVE  PULMONIC VALVE AV Area (Vmax):    2.60 cm     PV Vmax:       0.90 m/s AV Area (Vmean):   2.50 cm     PV Peak grad:  3.2 mmHg AV Area (VTI):     2.66 cm AV Vmax:           146.00 cm/s AV Vmean:          97.500 cm/s AV VTI:            0.299 m AV Peak Grad:      8.5 mmHg AV Mean Grad:      4.0 mmHg LVOT Vmax:         109.50 cm/s LVOT Vmean:        70.350 cm/s LVOT VTI:          0.230 m LVOT/AV VTI ratio: 0.77  AORTA Ao Root diam: 3.50 cm Ao Asc diam:  4.10 cm MITRAL VALVE               TRICUSPID VALVE MV Area (PHT): 3.34 cm    TR Peak grad:   38.2 mmHg MV Area VTI:   2.58 cm    TR Vmax:        309.00 cm/s MV Peak grad:  4.2 mmHg MV Mean grad:  1.0 mmHg    SHUNTS MV Vmax:       1.02 m/s    Systemic VTI:  0.23 m MV Vmean:      58.1 cm/s   Systemic Diam: 2.10 cm MV Decel Time: 227 msec MV E velocity: 90.80 cm/s MV A velocity: 62.40 cm/s MV E/A ratio:  1.46 Olga Millers MD Electronically signed by Olga Millers MD Signature Date/Time: 08/17/2023/11:31:41 AM    Final    DG Chest Port 1 View Result Date: 08/16/2023 CLINICAL  DATA:  Shortness of breath and fever. Current chemotherapy. History of multiple myeloma previous exam. EXAM: PORTABLE CHEST 1 VIEW COMPARISON:  07/25/2023, 07/26/2023. FINDINGS: The heart is enlarged and mediastinal contours is stable. Lung volumes are low with patchy airspace disease at the lung bases, increased from the prior exam. There are small bilateral pleural effusions. No pneumothorax is seen. Multiple lytic lesions are seen within the bones. Bilateral rib fractures are noted and unchanged from the previous exam. IMPRESSION: 1. Increased patchy airspace disease at the lung bases, possible atelectasis or infiltrate. 2. Small bilateral pleural effusions. 3. Stable lytic lesions throughout the bones with bilateral rib fractures and compatible with history of multiple myeloma. Electronically Signed   By: Thornell Sartorius M.D.   On: 08/16/2023 19:29        Scheduled Meds:  acyclovir  400 mg Oral Daily   apixaban  5 mg Oral BID   azithromycin  500 mg Oral Daily   brimonidine  1 drop Both Eyes TID   calcium-vitamin D  1 tablet Oral BID   carvedilol  3.125 mg Oral BID   Chlorhexidine Gluconate Cloth  6 each Topical Nightly   [START ON 08/20/2023] dexamethasone  16 mg Oral Weekly   And   dexamethasone  4 mg Oral Once per day on Sunday Tuesday Thursday   And   dexamethasone  2 mg Oral Once per day on Monday Wednesday   docusate sodium  100 mg Oral BID   dorzolamide-timolol  1 drop Both Eyes BID   furosemide  40 mg Intravenous Daily   latanoprost  1 drop Both Eyes QHS   levalbuterol  0.63 mg Nebulization TID   morphine  15 mg Oral QHS   multivitamin with minerals  1 tablet Oral Q breakfast   pantoprazole  40 mg Oral QAC breakfast   polyethylene glycol  17 g Oral BID   pregabalin  100 mg Oral Daily   pregabalin  200 mg Oral QHS   rosuvastatin  40 mg Oral Daily   senna-docusate  2 tablet Oral BID   sodium chloride flush  3 mL Intravenous Q12H   sodium chloride flush  3 mL Intravenous Q12H    triamcinolone  2 spray Nasal Daily   Continuous Infusions:  ceFEPime (MAXIPIME) IV 2 g (08/18/23 1337)     LOS: 2 days    Time spent: 38 min   Alwyn Ren, MD 08/18/2023, 1:38 PM

## 2023-08-18 NOTE — Plan of Care (Signed)
  Problem: Activity: Goal: Ability to tolerate increased activity will improve Outcome: Progressing   Problem: Activity: Goal: Ability to tolerate increased activity will improve Outcome: Progressing   Problem: Cardiac: Goal: Ability to achieve and maintain adequate cardiopulmonary perfusion will improve Outcome: Progressing

## 2023-08-18 NOTE — Progress Notes (Signed)
   08/18/23 2344  BiPAP/CPAP/SIPAP  BiPAP/CPAP/SIPAP Pt Type Adult (Patient home equipment, machine plugged into red outlet)  BiPAP/CPAP/SIPAP Resmed  Mask Type Nasal pillows (Patient home equipment)  Dentures removed? Not applicable  Flow Rate 4 lpm (bled into circuit)  Patient Home Equipment Yes  BiPAP/CPAP /SiPAP Vitals  Pulse Rate 61  Resp 18  SpO2 97 %  MEWS Score/Color  MEWS Score 0  MEWS Score Color [place] Si

## 2023-08-18 NOTE — Plan of Care (Signed)
  Problem: Activity: Goal: Ability to tolerate increased activity will improve Outcome: Progressing   Problem: Clinical Measurements: Goal: Ability to maintain a body temperature in the normal range will improve Outcome: Progressing   Problem: Respiratory: Goal: Ability to maintain adequate ventilation will improve Outcome: Progressing Goal: Ability to maintain a clear airway will improve Outcome: Progressing   Problem: Education: Goal: Knowledge of disease or condition will improve Outcome: Progressing Goal: Understanding of medication regimen will improve Outcome: Progressing Goal: Individualized Educational Video(s) Outcome: Progressing   Problem: Activity: Goal: Ability to tolerate increased activity will improve Outcome: Progressing   Problem: Cardiac: Goal: Ability to achieve and maintain adequate cardiopulmonary perfusion will improve Outcome: Progressing   Problem: Health Behavior/Discharge Planning: Goal: Ability to safely manage health-related needs after discharge will improve Outcome: Progressing   Problem: Education: Goal: Knowledge of General Education information will improve Description: Including pain rating scale, medication(s)/side effects and non-pharmacologic comfort measures Outcome: Progressing   Problem: Health Behavior/Discharge Planning: Goal: Ability to manage health-related needs will improve Outcome: Progressing   Problem: Clinical Measurements: Goal: Ability to maintain clinical measurements within normal limits will improve Outcome: Progressing Goal: Will remain free from infection Outcome: Progressing Goal: Diagnostic test results will improve Outcome: Progressing Goal: Respiratory complications will improve Outcome: Progressing Goal: Cardiovascular complication will be avoided Outcome: Progressing   Problem: Activity: Goal: Risk for activity intolerance will decrease Outcome: Progressing   Problem: Nutrition: Goal: Adequate  nutrition will be maintained Outcome: Progressing   Problem: Coping: Goal: Level of anxiety will decrease Outcome: Progressing   Problem: Elimination: Goal: Will not experience complications related to bowel motility Outcome: Progressing Goal: Will not experience complications related to urinary retention Outcome: Progressing   Problem: Pain Managment: Goal: General experience of comfort will improve and/or be controlled Outcome: Progressing   Problem: Safety: Goal: Ability to remain free from injury will improve Outcome: Progressing   Problem: Skin Integrity: Goal: Risk for impaired skin integrity will decrease Outcome: Progressing

## 2023-08-18 NOTE — Progress Notes (Signed)
 Daniel Whitaker   DOB:1946/01/24   QQ#:[id number]    ASSESSMENT & PLAN:   Multiple myeloma without remission (HCC) He has IgA kappa multiple myeloma with multiple bone fractures at presentation in 2024.  Bone marrow biopsy showed 15% involvement, standard risk myeloma FISH and cytogenetics   He has good partial response with combination chemotherapy with daratumumab, lenalidomide, bortezomib and dexamethasone His treatment was interrupted in February due to fluid overload and anemia He is currently admitted for management of atrial fibrillation with rapid ventricular response and fever His treatment will be placed on hold, including lenalidomide He will continue dexamethasone as prescribed He will continue vitamin D supplement and calcium He is on acyclovir for antimicrobial prophylaxis   Atrial fibrillation with rapid ventricular response, resolved He has converted to normal sinus rhythm after amiodarone drip Echocardiogram showed preserved ejection fraction but dilated atrium I will defer to cardiologist for management Agree with decision for NOAC   Cancer associated pain He has recent exacerbation of bone pain due to steroid withdrawal Pain control is improved reintroduction of dexamethasone and MS Contin at night He will continue oxycodone as needed along with acetaminophen   Fever, resolved Atypical infection is possible Procalcitonin is normal Agree with antibiotics for 1 more day before de-escalation if cultures are negative   Anemia Multifactorial, likely due to side effects of recent chemotherapy as well as component of hemodilution He is not symptomatic Monitor closely   Discharge planning Unknown I will place his chemotherapy on hold this week [name], MD 08/18/2023 1:58 PM  Subjective:  He is feeling better.  Breathing better.  This is despite elevated BNP, weight gain and continuous oxygen requirement.  He remained in sinus rhythm.  No bleeding complications  from anticoagulation therapy All cultures so far is negative  Objective:  Vitals:   08/18/23 1130 08/18/23 1200  BP:  (!) 139/58  Pulse:  62  Resp:  18  Temp: (!) 97.5 F (36.4 C)   SpO2:  97%     Intake/Output Summary (Last 24 hours) at 08/18/2023 1358 Last data filed at 08/18/2023 1200 Gross per 24 hour  Intake 2110.17 ml  Output 3760 ml  Net -1649.83 ml

## 2023-08-18 NOTE — Telephone Encounter (Signed)

## 2023-08-18 NOTE — Progress Notes (Signed)
 Rounding Note    Patient Name: Daniel Whitaker Date of Encounter: 08/18/2023  [facility] Cardiologist: [name], MD   Subjective   Feeling OK.  Breathing improving.   Inpatient Medications    Scheduled Meds:  acyclovir  400 mg Oral Daily   apixaban  5 mg Oral BID   azithromycin  500 mg Oral Daily   calcium-vitamin D  1 tablet Oral BID   carvedilol  3.125 mg Oral BID   Chlorhexidine Gluconate Cloth  6 each Topical Nightly   [START ON 08/20/2023] dexamethasone  16 mg Oral Weekly   And   dexamethasone  4 mg Oral Once per day on Sunday Tuesday Thursday   And   dexamethasone  2 mg Oral Once per day on Monday Wednesday   docusate sodium  100 mg Oral BID   dorzolamide-timolol  1 drop Both Eyes BID   latanoprost  1 drop Both Eyes QHS   levalbuterol  0.63 mg Nebulization TID   morphine  15 mg Oral QHS   multivitamin with minerals  1 tablet Oral Q breakfast   pantoprazole  40 mg Oral QAC breakfast   polyethylene glycol  17 g Oral BID   pregabalin  100 mg Oral BID   senna-docusate  2 tablet Oral BID   sodium chloride flush  3 mL Intravenous Q12H   sodium chloride flush  3 mL Intravenous Q12H   triamcinolone  2 spray Nasal Daily   Continuous Infusions:  ceFEPime (MAXIPIME) IV Stopped (08/18/23 5409)   vancomycin Stopped (08/17/23 1558)   PRN Meds: acetaminophen **OR** acetaminophen, alum & mag hydroxide-simeth, levalbuterol, mouth rinse, oxyCODONE, oxyCODONE, prochlorperazine, sodium chloride flush   Vital Signs    Vitals:   08/18/23 0200 08/18/23 0400 08/18/23 0512 08/18/23 0600  BP: (!) 112/36 (!) 112/46 (!) 137/48 (!) 140/57  Pulse: (!) 58 (!) 54 (!) 57 (!) 57  Resp: 15 13 14 13   Temp:      TempSrc:      SpO2: 96% 91% 97% 96%  Weight:      Height:        Intake/Output Summary (Last 24 hours) at 08/18/2023 0754 Last data filed at 08/18/2023 0654 Gross per 24 hour  Intake 3028.92 ml  Output 2260 ml  Net 768.92 ml      08/17/2023    3:15 AM  08/16/2023    5:38 PM 08/13/2023    8:57 AM  Last 3 Weights  Weight (lbs) 218 lb 4.1 oz 222 lb 10.6 oz 224 lb 12.8 oz  Weight (kg) 99 kg 101 kg 101.969 kg      Telemetry    Sinus bradycardia.  Sinus rhythm.  - Personally Reviewed  ECG    N/a - Personally Reviewed  Physical Exam   VS:  BP (!) 139/53 (BP Location: Left Arm)   Pulse (!) 55   Temp 97.7 F (36.5 C) (Oral)   Resp 15   Ht 6' (1.829 m)   Wt 99 kg Comment: with x2 extra pillows  SpO2 95%   BMI 29.60 kg/m  , BMI Body mass index is 29.6 kg/m. GENERAL:  Well appearing.  No acute distress HEENT: Pupils equal round and reactive, fundi not visualized, oral mucosa unremarkable NECK:  No jugular venous distention, waveform within normal limits, carotid upstroke brisk and symmetric, no bruits, no thyromegaly LUNGS:  Clear to auscultation bilaterally HEART:  RRR.  PMI not displaced or sustained,S1 and S2 within normal limits, no S3,  no S4, no clicks, no rubs, no murmurs ABD:  Flat, positive bowel sounds normal in frequency in pitch, no bruits, no rebound, no guarding, no midline pulsatile mass, no hepatomegaly, no splenomegaly EXT:  2 plus pulses throughout, no edema, no cyanosis no clubbing SKIN:  No rashes no nodules NEURO:  Cranial nerves II through XII grossly intact, motor grossly intact throughout PSYCH:  Cognitively intact, oriented to person place and time   Labs    High Sensitivity Troponin:   Recent Labs  Lab 07/25/23 1133 08/16/23 1757 08/16/23 2042 08/17/23 0037 08/17/23 0303  TROPONINIHS 5 11 25* 41* 41*     Chemistry Recent Labs  Lab 08/13/23 0839 08/16/23 1757 08/17/23 0037  NA 139 138 136  K 4.4 3.9 3.5  CL 105 103 103  CO2 28 21* 22  GLUCOSE 99 178* 134*  BUN 30* 38* 33*  CREATININE 0.74 0.92 0.68  CALCIUM 9.5 9.4 8.9  MG  --  1.9  --   PROT 7.6 8.0 6.9  ALBUMIN 3.3* 2.9* 2.5*  AST 10* 23 11*  ALT 16 18 15   ALKPHOS 107 93 75  BILITOT 0.3 0.8 0.6  GFRNONAA >60 >60 >60  ANIONGAP 6  14 11     Lipids No results for input(s): "CHOL", "TRIG", "HDL", "LABVLDL", "LDLCALC", "CHOLHDL" in the last 168 hours.  Hematology Recent Labs  Lab 08/13/23 0839 08/16/23 1757 08/17/23 0037  WBC 7.9 8.7 7.8  RBC 3.33* 3.69* 3.07*  HGB 9.2* 10.4* 8.5*  HCT 29.7* 33.0* 28.8*  MCV 89.2 89.4 93.8  MCH 27.6 28.2 27.7  MCHC 31.0 31.5 29.5*  RDW 17.1* 17.6* 17.8*  PLT 254 268 238   Thyroid  Recent Labs  Lab 08/17/23 1219  TSH 0.452    BNP Recent Labs  Lab 08/17/23 1219  BNP 265.3*    DDimer No results for input(s): "DDIMER" in the last 168 hours.   Radiology    ECHOCARDIOGRAM COMPLETE Result Date: 08/17/2023    ECHOCARDIOGRAM REPORT   Patient Name:   Daniel Whitaker Date of Exam: 08/17/2023 Medical Rec #:  [id number]        Height:       72.0 in Accession #:    [id number]       Weight:       218.3 lb Date of Birth:  09/16/1945       BSA:          2.211 m Patient Age:    77 years         BP:           130/51 mmHg Patient Gender: M                HR:           63 bpm. Exam Location:  Inpatient Procedure: 2D Echo, Cardiac Doppler, Color Doppler and Intracardiac            Opacification Agent (Both Spectral and Color Flow Doppler were            utilized during procedure). Indications:    A fib  History:        Patient has prior history of Echocardiogram examinations, most                 recent 07/20/2023. CAD, Arrythmias:Atrial Fibrillation; Risk                 Factors:Hypertension.  Sonographer:    Amy Chionchio Referring Phys: [id number]  SUNDIL IMPRESSIONS  1. Left ventricular ejection fraction, by estimation, is 60 to 65%. The left ventricle has normal function. The left ventricle has no regional wall motion abnormalities. Left ventricular diastolic parameters were normal.  2. Right ventricular systolic function is normal. The right ventricular size is normal.  3. Left atrial size was severely dilated.  4. The mitral valve is normal in structure. Mild mitral valve regurgitation. No  evidence of mitral stenosis.  5. The aortic valve is tricuspid. Aortic valve regurgitation is not visualized. Aortic valve sclerosis is present, with no evidence of aortic valve stenosis.  6. Aortic dilatation noted. There is mild dilatation of the ascending aorta, measuring 39 mm. FINDINGS  Left Ventricle: Left ventricular ejection fraction, by estimation, is 60 to 65%. The left ventricle has normal function. The left ventricle has no regional wall motion abnormalities. Definity contrast agent was given IV to delineate the left ventricular  endocardial borders. The left ventricular internal cavity size was normal in size. There is no left ventricular hypertrophy. Left ventricular diastolic parameters were normal. Right Ventricle: The right ventricular size is normal. Right ventricular systolic function is normal. Left Atrium: Left atrial size was severely dilated. Right Atrium: Right atrial size was normal in size. Pericardium: There is no evidence of pericardial effusion. Mitral Valve: The mitral valve is normal in structure. Mild mitral valve regurgitation. No evidence of mitral valve stenosis. MV peak gradient, 4.2 mmHg. The mean mitral valve gradient is 1.0 mmHg. Tricuspid Valve: The tricuspid valve is normal in structure. Tricuspid valve regurgitation is mild . No evidence of tricuspid stenosis. Aortic Valve: The aortic valve is tricuspid. Aortic valve regurgitation is not visualized. Aortic valve sclerosis is present, with no evidence of aortic valve stenosis. Aortic valve mean gradient measures 4.0 mmHg. Aortic valve peak gradient measures 8.5  mmHg. Aortic valve area, by VTI measures 2.66 cm. Pulmonic Valve: The pulmonic valve was not well visualized. Pulmonic valve regurgitation is not visualized. No evidence of pulmonic stenosis. Aorta: Aortic dilatation noted. There is mild dilatation of the ascending aorta, measuring 39 mm. Venous: The inferior vena cava was not well visualized. IAS/Shunts: The  interatrial septum was not well visualized.  LEFT VENTRICLE PLAX 2D LVIDd:         5.00 cm      Diastology LVIDs:         2.80 cm      LV e' medial:    7.62 cm/s LV PW:         0.80 cm      LV E/e' medial:  11.9 LV IVS:        0.80 cm      LV e' lateral:   9.90 cm/s LVOT diam:     2.10 cm      LV E/e' lateral: 9.2 LV SV:         80 LV SV Index:   36 LVOT Area:     3.46 cm  LV Volumes (MOD) LV vol d, MOD A4C: 195.0 ml LV vol s, MOD A4C: 82.6 ml LV SV MOD A4C:     195.0 ml RIGHT VENTRICLE RV Basal diam:  5.10 cm TAPSE (M-mode): 2.0 cm LEFT ATRIUM              Index        RIGHT ATRIUM           Index LA Vol (A2C):   162.0 ml 73.27 ml/m  RA Area:     21.80  cm LA Vol (A4C):   146.5 ml 66.26 ml/m  RA Volume:   67.00 ml  30.30 ml/m LA Biplane Vol: 150.0 ml 67.84 ml/m  AORTIC VALVE                    PULMONIC VALVE AV Area (Vmax):    2.60 cm     PV Vmax:       0.90 m/s AV Area (Vmean):   2.50 cm     PV Peak grad:  3.2 mmHg AV Area (VTI):     2.66 cm AV Vmax:           146.00 cm/s AV Vmean:          97.500 cm/s AV VTI:            0.299 m AV Peak Grad:      8.5 mmHg AV Mean Grad:      4.0 mmHg LVOT Vmax:         109.50 cm/s LVOT Vmean:        70.350 cm/s LVOT VTI:          0.230 m LVOT/AV VTI ratio: 0.77  AORTA Ao Root diam: 3.50 cm Ao Asc diam:  4.10 cm MITRAL VALVE               TRICUSPID VALVE MV Area (PHT): 3.34 cm    TR Peak grad:   38.2 mmHg MV Area VTI:   2.58 cm    TR Vmax:        309.00 cm/s MV Peak grad:  4.2 mmHg MV Mean grad:  1.0 mmHg    SHUNTS MV Vmax:       1.02 m/s    Systemic VTI:  0.23 m MV Vmean:      58.1 cm/s   Systemic Diam: 2.10 cm MV Decel Time: 227 msec MV E velocity: 90.80 cm/s MV A velocity: 62.40 cm/s MV E/A ratio:  1.46 Olga Millers MD Electronically signed by Olga Millers MD Signature Date/Time: 08/17/2023/11:31:41 AM    Final    DG Chest Port 1 View Result Date: 08/16/2023 CLINICAL DATA:  Shortness of breath and fever. Current chemotherapy. History of multiple myeloma previous  exam. EXAM: PORTABLE CHEST 1 VIEW COMPARISON:  07/25/2023, 07/26/2023. FINDINGS: The heart is enlarged and mediastinal contours is stable. Lung volumes are low with patchy airspace disease at the lung bases, increased from the prior exam. There are small bilateral pleural effusions. No pneumothorax is seen. Multiple lytic lesions are seen within the bones. Bilateral rib fractures are noted and unchanged from the previous exam. IMPRESSION: 1. Increased patchy airspace disease at the lung bases, possible atelectasis or infiltrate. 2. Small bilateral pleural effusions. 3. Stable lytic lesions throughout the bones with bilateral rib fractures and compatible with history of multiple myeloma. Electronically Signed   By: Thornell Sartorius M.D.   On: 08/16/2023 19:29    Cardiac Studies   Echo 07/2023: MPRESSIONS     1. Limited study for LV function; not all views obtained; MR not well  assessed.   2. Left ventricular ejection fraction, by estimation, is 60 to 65%. The  left ventricle has normal function. The left ventricle has no regional  wall motion abnormalities. Left ventricular diastolic parameters were  normal.   3. Right ventricular systolic function is normal. The right ventricular  size is normal.   4. The mitral valve is normal in structure. Mild to moderate mitral valve  regurgitation. No evidence of  mitral stenosis.   5. The aortic valve is tricuspid. Aortic valve regurgitation is not  visualized. Aortic valve sclerosis is present, with no evidence of aortic  valve stenosis.   6. Aortic dilatation noted. There is mild dilatation of the ascending  aorta, measuring 43 mm.     Patient Profile     78 y.o. adult M with MM on chemo, hypertension, CAD s/p LAD PCI (2023), OSA and anemia admitted with new onset atrial fibrillation with RVR.  Assessment & Plan    # New onset atrial fibrillation with RVR: He converted to sinus rhythm on amiodarone.  LVEF 60-65%.  TSH wnl.  He is now on Eliquis  and carvedilol.   # Elevated troponin: # CAD s/p PCI: # Hyperlipidemia: Hs-troponin increased mildly.  Prior LAD PCI in 2023. He currently has no chest pain.  Likely demand ischemia.  No further evaluation needed at this time.  Off aspirin now that he is on Eliquis. Continue carvedilol.  Continue rosuvastatin.   HFpEF:  BNP 265.  Diuretics held due to concern for sepsis.  Will resume home lasix 40mg  in the am.  Continue to hold the 20mg  pm dose for now and monitor BUN/Cr closely.   # Hypertension: BP stable.  DBP a little low.  Continue carvedilol.  Resume home spironolactone when able.        For questions or updates, please contact [facility] Please consult [email or web address] for contact info under        Signed, Chilton Si, MD  08/18/2023, 7:54 AM

## 2023-08-19 ENCOUNTER — Inpatient Hospital Stay (HOSPITAL_COMMUNITY)

## 2023-08-19 ENCOUNTER — Encounter: Payer: Self-pay | Admitting: Hematology and Oncology

## 2023-08-19 DIAGNOSIS — I4891 Unspecified atrial fibrillation: Secondary | ICD-10-CM | POA: Diagnosis not present

## 2023-08-19 LAB — COMPREHENSIVE METABOLIC PANEL
ALT: 15 U/L (ref 0–44)
AST: 11 U/L — ABNORMAL LOW (ref 15–41)
Albumin: 2.8 g/dL — ABNORMAL LOW (ref 3.5–5.0)
Alkaline Phosphatase: 76 U/L (ref 38–126)
Anion gap: 9 (ref 5–15)
BUN: 29 mg/dL — ABNORMAL HIGH (ref 8–23)
CO2: 25 mmol/L (ref 22–32)
Calcium: 9.7 mg/dL (ref 8.9–10.3)
Chloride: 103 mmol/L (ref 98–111)
Creatinine, Ser: 0.57 mg/dL — ABNORMAL LOW (ref 0.61–1.24)
GFR, Estimated: >60 mL/min (ref >60)
Glucose, Bld: 87 mg/dL (ref 70–99)
Potassium: 3.2 mmol/L — ABNORMAL LOW (ref 3.5–5.1)
Sodium: 137 mmol/L (ref 135–145)
Total Bilirubin: 0.6 mg/dL (ref 0.0–1.2)
Total Protein: 7.6 g/dL (ref 6.5–8.1)

## 2023-08-19 LAB — LEGIONELLA PNEUMOPHILA SEROGP 1 UR AG: L. pneumophila Serogp 1 Ur Ag: NEGATIVE

## 2023-08-19 LAB — CBC
HCT: 30.8 % — ABNORMAL LOW (ref 39.0–52.0)
Hemoglobin: 9.1 g/dL — ABNORMAL LOW (ref 13.0–17.0)
MCH: 27.5 pg (ref 26.0–34.0)
MCHC: 29.5 g/dL — ABNORMAL LOW (ref 30.0–36.0)
MCV: 93.1 fL (ref 80.0–100.0)
Platelets: 253 10*3/uL (ref 150–400)
RBC: 3.31 MIL/uL — ABNORMAL LOW (ref 4.22–5.81)
RDW: 17 % — ABNORMAL HIGH (ref 11.5–15.5)
WBC: 5.7 10*3/uL (ref 4.0–10.5)
nRBC: 0 % (ref 0.0–0.2)

## 2023-08-19 MED ORDER — MUSCLE RUB 10-15 % EX CREA
TOPICAL_CREAM | CUTANEOUS | Status: DC | PRN
  Filled 2023-08-19: qty 85

## 2023-08-19 MED ORDER — POTASSIUM CHLORIDE CRYS ER 20 MEQ PO TBCR
40.0000 meq | EXTENDED_RELEASE_TABLET | Freq: Once | ORAL | Status: AC
  Administered 2023-08-19: 40 meq via ORAL
  Filled 2023-08-19: qty 2

## 2023-08-19 MED ORDER — FUROSEMIDE 40 MG PO TABS
40.0000 mg | ORAL_TABLET | Freq: Every day | ORAL | Status: DC
  Filled 2023-08-19: qty 1

## 2023-08-19 MED ORDER — DEXAMETHASONE 2 MG PO TABS
2.0000 mg | ORAL_TABLET | Freq: Once | ORAL | Status: DC
  Filled 2023-08-19: qty 1

## 2023-08-19 NOTE — Progress Notes (Addendum)
 Rounding Note    Patient Name: VALOR QUAINTANCE Date of Encounter: 08/19/2023  [facility] Cardiologist: [name], MD   Subjective   Breathing is OK   Rib cage sore   Inpatient Medications    Scheduled Meds:  acyclovir  400 mg Oral Daily   apixaban  5 mg Oral BID   brimonidine  1 drop Both Eyes TID   calcium-vitamin D  1 tablet Oral BID   carvedilol  3.125 mg Oral BID   Chlorhexidine Gluconate Cloth  6 each Topical Nightly   dexamethasone  4 mg Oral Once per day on Sunday Tuesday Thursday   And   dexamethasone  2 mg Oral Once per day on Monday Wednesday   [START ON 08/20/2023] dexamethasone  2 mg Oral Once   docusate sodium  100 mg Oral BID   dorzolamide-timolol  1 drop Both Eyes BID   furosemide  40 mg Intravenous Daily   latanoprost  1 drop Both Eyes QHS   levalbuterol  0.63 mg Nebulization TID   morphine  15 mg Oral QHS   multivitamin with minerals  1 tablet Oral Q breakfast   pantoprazole  40 mg Oral QAC breakfast   polyethylene glycol  17 g Oral BID   pregabalin  100 mg Oral Daily   pregabalin  200 mg Oral QHS   rosuvastatin  40 mg Oral Daily   senna-docusate  2 tablet Oral BID   sodium chloride flush  3 mL Intravenous Q12H   sodium chloride flush  3 mL Intravenous Q12H   triamcinolone  2 spray Nasal Daily   Continuous Infusions:  ceFEPime (MAXIPIME) IV Stopped (08/19/23 0640)   PRN Meds: acetaminophen **OR** acetaminophen, alum & mag hydroxide-simeth, levalbuterol, Muscle Rub, mouth rinse, oxyCODONE, oxyCODONE, prochlorperazine, sodium chloride flush   Vital Signs    Vitals:   08/19/23 0400 08/19/23 0600 08/19/23 0800 08/19/23 1000  BP: (!) 129/36 (!) 129/37 139/70 130/60  Pulse: (!) 56 (!) 54 70 (!) 58  Resp: 14 13 15 13   Temp:   98.4 F (36.9 C)   TempSrc:   Oral   SpO2: 95% 96% 98% 99%  Weight:      Height:        Intake/Output Summary (Last 24 hours) at 08/19/2023 1258 Last data filed at 08/19/2023 0900 Gross per 24 hour   Intake 300.06 ml  Output 3400 ml  Net -3099.94 ml      08/17/2023    3:15 AM 08/16/2023    5:38 PM 08/13/2023    8:57 AM  Last 3 Weights  Weight (lbs) 218 lb 4.1 oz 222 lb 10.6 oz 224 lb 12.8 oz  Weight (kg) 99 kg 101 kg 101.969 kg      Telemetry    Sinus rhythm - Personally Reviewed  ECG    N/a - Personally Reviewed  Physical Exam   VS:  BP 130/60   Pulse (!) 58   Temp 98.4 F (36.9 C) (Oral)   Resp 13   Ht 6' (1.829 m)   Wt 99 kg Comment: with x2 extra pillows  SpO2 99%   BMI 29.60 kg/m  , BMI Body mass index is 29.6 kg/m. GENERAL:  Well appearing.  No acute distress  Examined in chair   NECK:  No jugular venous distention,  LUNGS:  Clear to auscultation bilaterally HEART:  RRR.no murmurs  N oS3    ABD:  Soft  No RUQ tenderness  EXT:  2 plus  pulses throughout, no edema  Labs    High Sensitivity Troponin:   Recent Labs  Lab 07/25/23 1133 08/16/23 1757 08/16/23 2042 08/17/23 0037 08/17/23 0303  TROPONINIHS 5 11 25* 41* 41*     Chemistry Recent Labs  Lab 08/16/23 1757 08/17/23 0037 08/18/23 0741 08/18/23 1429 08/19/23 0811  NA 138 136 138  --  137  K 3.9 3.5 3.4*  --  3.2*  CL 103 103 104  --  103  CO2 21* 22 24  --  25  GLUCOSE 178* 134* 97  --  87  BUN 38* 33* 27*  --  29*  CREATININE 0.92 0.68 0.75  --  0.57*  CALCIUM 9.4 8.9 9.4  --  9.7  MG 1.9  --   --  2.0  --   PROT 8.0 6.9 7.2  --  7.6  ALBUMIN 2.9* 2.5* 2.6*  --  2.8*  AST 23 11* 12*  --  11*  ALT 18 15 14   --  15  ALKPHOS 93 75 77  --  76  BILITOT 0.8 0.6 0.5  --  0.6  GFRNONAA >60 >60 >60  --  >60  ANIONGAP 14 11 10   --  9    Lipids No results for input(s): "CHOL", "TRIG", "HDL", "LABVLDL", "LDLCALC", "CHOLHDL" in the last 168 hours.  Hematology Recent Labs  Lab 08/17/23 0037 08/18/23 0741 08/19/23 0811  WBC 7.8 5.6 5.7  RBC 3.07* 2.95* 3.31*  HGB 8.5* 8.2* 9.1*  HCT 28.8* 26.9* 30.8*  MCV 93.8 91.2 93.1  MCH 27.7 27.8 27.5  MCHC 29.5* 30.5 29.5*  RDW 17.8* 17.3*  17.0*  PLT 238 235 253   Thyroid  Recent Labs  Lab 08/17/23 1219  TSH 0.452    BNP Recent Labs  Lab 08/17/23 1219  BNP 265.3*    DDimer No results for input(s): "DDIMER" in the last 168 hours.   Radiology    No results found.   Cardiac Studies   Echo 07/2023: MPRESSIONS     1. Limited study for LV function; not all views obtained; MR not well  assessed.   2. Left ventricular ejection fraction, by estimation, is 60 to 65%. The  left ventricle has normal function. The left ventricle has no regional  wall motion abnormalities. Left ventricular diastolic parameters were  normal.   3. Right ventricular systolic function is normal. The right ventricular  size is normal.   4. The mitral valve is normal in structure. Mild to moderate mitral valve  regurgitation. No evidence of mitral stenosis.   5. The aortic valve is tricuspid. Aortic valve regurgitation is not  visualized. Aortic valve sclerosis is present, with no evidence of aortic  valve stenosis.   6. Aortic dilatation noted. There is mild dilatation of the ascending  aorta, measuring 43 mm.     Patient Profile     78 y.o. adult M with MM on chemo, hypertension, CAD s/p LAD PCI (2023), OSA and anemia admitted with new onset atrial fibrillation with RVR.  Assessment & Plan    # New onset atrial fibrillation with RVR: He converted to sinus rhythm on amiodarone.  He is not on this now   LVEF 60-65%.  TSH wnl.  Continue Eliquis   Continue carvedilol    # Elevated troponin: # CAD s/p PCI: Hs-troponin increased mildly.  Prior prox LAD PCI/stent in 2023. He currently has no chest pain.  Likely demand ischemia.  No further evaluation needed at this  time.   Off aspirin now that he is on Eliquis. Continue carvedilol.  Continue rosuvastatin.  Will review with interventional service use of ASA or plavix in this patient     HFpEF:   Lasix 40 mg IV daily started yesterday   Volume status appears overall OK Would switch  to PO tomorrow     Keep daily (was on 40/20)     # Hypertension: BP is controlled on current regimen  He is not on spiro as he was as outpt   Follow BP for now          For questions or updates, please contact [facility] Please consult [email or web address] for contact info under        Signed, Dietrich Pates, MD  08/19/2023, 12:58 PM

## 2023-08-19 NOTE — Evaluation (Signed)
 Physical Therapy Evaluation Patient Details Name: Daniel Whitaker MRN: [id number] DOB: 1945-08-02 Today's Date: 08/19/2023  History of Present Illness  Daniel Whitaker is a 78 year old male who presented to [facility] on 08/16/23 secondary to fever and shortness of breath and found to have evidence of sepsis secondary to pneumonia, complicated by atrial fibrillation with RVR. Pt was recently in the ED on 07/23/23 due to hypoxia and dyspnea and concern for fluid overload. Patient was previously admitted in December 2024 with hypercalcemia, and rib fractures. PMH: recent diagnosis of multiple myeloma, MGUS, cataracts, glaucoma, obesity, RLS, spinal stenosis, neuropathy.  Clinical Impression  Pt admitted with above diagnosis.  Pt currently with functional limitations due to the deficits listed below (see PT Problem List). Pt will benefit from acute skilled PT to increase their independence and safety with mobility to allow discharge.     The patient is eager to mobilize and wishes for Dc soon. Patient has been going to OPPT for dry needling of the right calf. Patient now will benefit from HHPT and transition to OPPT in the future.  The patient ambulated x 200' using a rollator. Patient maintained on 4 L [place], SPO2 >94%  HR in 80's.  Patient has significant pain around the left shoulder and scapula with limits in AROM.        If plan is discharge home, recommend the following: A little help with walking and/or transfers;A little help with bathing/dressing/bathroom;Assistance with cooking/housework;Assist for transportation   Can travel by private vehicle        Equipment Recommendations None recommended by PT  Recommendations for Other Services       Functional Status Assessment Patient has had a recent decline in their functional status and demonstrates the ability to make significant improvements in function in a reasonable and predictable amount of time.     Precautions / Restrictions  Precautions Recall of Precautions/Restrictions: Intact Precaution/Restrictions Comments: Mon HR and O2 Restrictions Weight Bearing Restrictions Per Provider Order: No Other Position/Activity Restrictions: Per wife, pt can cough and break a bone.  LT shoulder VERY painful to touch and movement. KEEP GAIT BELT LOW. Multiple rib fractures-chronic      Mobility  Bed Mobility               General bed mobility comments: Pt received in recliner.    Transfers   Equipment used: Rollator (4 wheels)               General transfer comment: cues for safety    Ambulation/Gait Ambulation/Gait assistance: Contact guard assist Gait Distance (Feet): 200 Feet Assistive device: Rollator (4 wheels)   Gait velocity: decr     General Gait Details: gait slow,steady.  Stairs            Wheelchair Mobility     Tilt Bed    Modified Rankin (Stroke Patients Only)       Balance Overall balance assessment: Mild deficits observed, not formally tested                                           Pertinent Vitals/Pain Pain Assessment Faces Pain Scale: Hurts worst Pain Location: left shoukder, scapula Pain Descriptors / Indicators: Penetrating, Aching Pain Intervention(s): Monitored during session, Premedicated before session, Repositioned    Home Living Family/patient expects to be discharged to:: Private residence Living Arrangements: Spouse/significant other Available Help  at Discharge: Family;Available 24 hours/day Type of Home: House Home Access: Stairs to enter Entrance Stairs-Rails: None Entrance Stairs-Number of Steps: 2   Home Layout: One level Home Equipment: Grab bars - tub/shower;Grab bars - toilet;Shower seat - built in;Rollator (4 wheels);Adaptive equipment Additional Comments: Staying at daughters's home due to being a 1 story home. Pt used to sleep  in a lift recliner but now tolerating a bed with a body support pillow.    Prior  Function Prior Level of Function : Independent/Modified Independent;Driving             Mobility Comments: Uses Rollator at all times.  Had to stop outpatient OT ~2 weeks ago due to deteriorating health. ADLs Comments: Supervised with showers and wife helps wash and dry feet. Wife assists with socks and shoes/     Extremity/Trunk Assessment   Upper Extremity Assessment Upper Extremity Assessment: Defer to OT evaluation RUE Sensation: history of peripheral neuropathy LUE Deficits / Details: Severe pain with touch anywhere near shoulder. Pt can abduct to about 90 with pain and perform FF to ~80 degrees with more pain. Noted LUE also edematous. LUE Sensation: history of peripheral neuropathy    Lower Extremity Assessment Lower Extremity Assessment: LLE deficits/detail LLE Deficits / Details: foot drop, LLE Sensation: decreased proprioception;history of peripheral neuropathy    Cervical / Trunk Assessment Cervical / Trunk Assessment: Kyphotic  Communication   Communication Communication: No apparent difficulties    Cognition Arousal: Alert Behavior During Therapy: WFL for tasks assessed/performed   PT - Cognitive impairments: No apparent impairments                         Following commands: Intact       Cueing       General Comments      Exercises     Assessment/Plan    PT Assessment Patient needs continued PT services  PT Problem List Decreased strength;Decreased activity tolerance;Decreased mobility;Decreased range of motion;Decreased balance;Decreased safety awareness;Pain       PT Treatment Interventions DME instruction;Therapeutic activities;Gait training;Functional mobility training;Patient/family education    PT Goals (Current goals can be found in the Care Plan section)  Acute Rehab PT Goals Patient Stated Goal: go home tomorrow PT Goal Formulation: With patient/family Time For Goal Achievement: 09/02/23 Potential to Achieve Goals:  Good    Frequency Min 3X/week     Co-evaluation   Reason for Co-Treatment: To address functional/ADL transfers;Other (comment) PT goals addressed during session: Mobility/safety with mobility OT goals addressed during session: Proper use of Adaptive equipment and DME;ADL's and self-care       AM-PAC PT "6 Clicks" Mobility  Outcome Measure Help needed turning from your back to your side while in a flat bed without using bedrails?: A Lot Help needed moving from lying on your back to sitting on the side of a flat bed without using bedrails?: A Lot Help needed moving to and from a bed to a chair (including a wheelchair)?: A Little Help needed standing up from a chair using your arms (e.g., wheelchair or bedside chair)?: A Little Help needed to walk in hospital room?: A Little Help needed climbing 3-5 steps with a railing? : A Lot 6 Click Score: 15    End of Session Equipment Utilized During Treatment: Gait belt Activity Tolerance: Patient tolerated treatment well Patient left: in chair;with call bell/phone within reach Nurse Communication: Mobility status PT Visit Diagnosis: Unsteadiness on feet (R26.81);Difficulty in walking, not elsewhere  classified (R26.2);Muscle weakness (generalized) (M62.81);Pain Pain - Right/Left: Left Pain - part of body: Arm    Time: 1123-1150 PT Time Calculation (min) (ACUTE ONLY): 27 min   Charges:   PT Evaluation $PT Eval Low Complexity: 1 Low   PT General Charges $$ ACUTE PT VISIT: 1 Visit         Blanchard Kelch PT Acute Rehabilitation Services Office [phone] Weekend pager-[phone]   Rada Hay 08/19/2023, 1:54 PM

## 2023-08-19 NOTE — Evaluation (Signed)
 Occupational Therapy Evaluation Patient Details Name: Daniel Whitaker MRN: [id number] DOB: 25-Apr-1946 Today's Date: 08/19/2023   History of Present Illness   Bhavik Cabiness is a 78 year old male who presented to [facility] on 08/16/23 secondary to fever and shortness of breath and found to have evidence of sepsis secondary to pneumonia, complicated by atrial fibrillation with RVR. Pt was recently in the ED on 07/23/23 due to hypoxia and dyspnea and concern for fluid overload. Patient was previously admitted in December 2024 with hypercalcemia, and rib fractures. PMH: recent diagnosis of multiple myeloma, MGUS, cataracts, glaucoma, obesity, RLS, spinal stenosis, neuropathy.     Clinical Impressions Patient is currently requiring as high as moderate assistance with basic ADLs, as well as  Minimal assist with functional transfers to stand, then CGA for ambulation with use of pt's rollator.   Current level of function is below patient's typical baseline.    During this evaluation, patient was limited by LUE proximal pain even with very light touch that appeared 10/10 generalized weakness, and impaired activity tolerance with new need of continuous O2 at 3-4L where pt was using it PRN prior to admission, all of which has the potential to impact patient's and/or caregivers' safety and independence during functional mobility, as well as performance for ADLs.    Patient lives with his spouse who is able to provide 24/7 supervision and assistance.  Patient demonstrates very good  rehab potential, and should benefit from continued skilled occupational therapy services while in acute care to maximize safety, independence and quality of life at home.  Continued occupational therapy services in the home are recommended.  ?      If plan is discharge home, recommend the following:   A little help with walking and/or transfers;A little help with bathing/dressing/bathroom;Assistance with cooking/housework;Assist  for transportation     Functional Status Assessment   Patient has had a recent decline in their functional status and demonstrates the ability to make significant improvements in function in a reasonable and predictable amount of time.     Equipment Recommendations   Other (comment) (adaptive equipment)     Recommendations for Other Services         Precautions/Restrictions   Precautions Precautions: Fall Recall of Precautions/Restrictions: Intact Precaution/Restrictions Comments: Mon HR and O2 Restrictions Weight Bearing Restrictions Per Provider Order: No Other Position/Activity Restrictions: Per wife, pt can cough and break a bone.  LT shoulder VERY painful to touch and movement. KEEP GAIT BELT LOW. Multiple rib fractures-chronic     Mobility Bed Mobility               General bed mobility comments: Pt received in recliner.    Transfers   Equipment used: Rollator (4 wheels)               General transfer comment: cues for safety      Balance Overall balance assessment: Mild deficits observed, not formally tested                                         ADL either performed or assessed with clinical judgement   ADL Overall ADL's : Needs assistance/impaired Eating/Feeding: Modified independent   Grooming: Sitting;Set up Grooming Details (indicate cue type and reason): Pt reoprts at home he can stand for limited grooming and sits in rollator to shave in order to conserve energy. Upper Body Bathing: Minimal  assistance;Sitting Upper Body Bathing Details (indicate cue type and reason): Anticipate at least Min As due to LUE severe pain and limitations. Lower Body Bathing: Minimal assistance;Sitting/lateral leans;Sit to/from stand Lower Body Bathing Details (indicate cue type and reason): Baseline assisted to wash lower legs and feet. Pt and spouse educated on long handeled bath brush or sponge to eliminate burden on spouse and wife  and pt very receptive. Upper Body Dressing : Moderate assistance;Cueing for compensatory techniques Upper Body Dressing Details (indicate cue type and reason): Due to LUE pain Lower Body Dressing: Minimal assistance;Moderate assistance;Sit to/from stand;Sitting/lateral leans Lower Body Dressing Details (indicate cue type and reason): Wife has been assisting with socks/shoes, PRN. Toilet Transfer: Rollator (4 wheels);Contact guard assist;Cueing for safety;Ambulation;Minimal assistance Toilet Transfer Details (indicate cue type and reason): Cues to lock brakes on Rollator prior to standing from recliner for safety. Pt required Min As to stand from recliner to his rollator but only CGA for ambulation in hallway.   Toileting - Clothing Manipulation Details (indicate cue type and reason): Not observed as pt denied need to void. Anticipate no more than CGA. Voiding in external catheter     Functional mobility during ADLs: Contact guard assist;Rollator (4 wheels);Cueing for safety       Vision   Vision Assessment?: No apparent visual deficits     Perception         Praxis         Pertinent Vitals/Pain Pain Assessment Pain Assessment: Faces Pain Score: 7  (chest and abdomen 7/10 pain. Pt reports tolerable.) Faces Pain Scale: Hurts worst (LT shoulder and scapular areawith any touch.)     Extremity/Trunk Assessment Upper Extremity Assessment Upper Extremity Assessment: Right hand dominant;RUE deficits/detail;LUE deficits/detail RUE Sensation: history of peripheral neuropathy LUE Deficits / Details: Severe pain with touch anywhere near shoulder. Pt can abduct to about 90 with pain and perform FF to ~80 degrees with more pain. Noted LUE also edematous. LUE Sensation: history of peripheral neuropathy   Lower Extremity Assessment Lower Extremity Assessment: LLE deficits/detail LLE Deficits / Details: foot drop, LLE Sensation: decreased proprioception;history of peripheral neuropathy    Cervical / Trunk Assessment Cervical / Trunk Assessment: Kyphotic   Communication Communication Communication: No apparent difficulties   Cognition Arousal: Alert Behavior During Therapy: WFL for tasks assessed/performed Cognition: No apparent impairments                               Following commands: Intact       Cueing  General Comments          Exercises     Shoulder Instructions      Home Living Family/patient expects to be discharged to:: Private residence Living Arrangements: Spouse/significant other Available Help at Discharge: Family;Available 24 hours/day Type of Home: House Home Access: Stairs to enter [employer] of Steps: 2 Entrance Stairs-Rails: None Home Layout: One level     Bathroom Shower/Tub: Producer, television/film/video: Handicapped height     Home Equipment: Grab bars - tub/shower;Grab bars - toilet;Shower seat - built in;Rollator (4 wheels);Adaptive equipment Adaptive Equipment: Reacher Lexicographer which pt uses outside) Additional Comments: Staying at daughters's home due to being a 1 story home. Pt used to sleep  in a lift recliner but now tolerating a bed with a body support pillow.      Prior Functioning/Environment Prior Level of Function : Independent/Modified Independent;Driving  Mobility Comments: Uses Rollator at all times.  Had to stop outpatient OT ~2 weeks ago due to deteriorating health. ADLs Comments: Supervised with showers and wife helps wash and dry feet. Wife assists with socks and shoes/    OT Problem List: Impaired balance (sitting and/or standing);Decreased activity tolerance;Decreased safety awareness;Pain;Cardiopulmonary status limiting activity   OT Treatment/Interventions: Therapeutic activities;Self-care/ADL training;Therapeutic exercise;Energy conservation;Patient/family education;Balance training;DME and/or AE instruction      OT Goals(Current goals can be found  in the care plan section)   Acute Rehab OT Goals Patient Stated Goal: Manage LUE pain, keep walking OT Goal Formulation: With patient/family Time For Goal Achievement: 09/02/23 Potential to Achieve Goals: Good ADL Goals Pt Will Perform Lower Body Dressing: with adaptive equipment;with set-up Pt Will Transfer to Toilet: with supervision;ambulating Pt Will Perform Toileting - Clothing Manipulation and hygiene: with adaptive equipment;with modified independence;sitting/lateral leans;sit to/from stand Pt/caregiver will Perform Home Exercise Program: Increased ROM;Left upper extremity (VERY GENTLE rehab to LT shoulder. Did secure chat MD with request for imaging.  If there is fracture, discontinue HEP and follow MD recommendations.)   OT Frequency:  Min 1X/week    Co-evaluation PT/OT/SLP Co-Evaluation/Treatment: Yes Reason for Co-Treatment: To address functional/ADL transfers;Other (comment) PT goals addressed during session: Mobility/safety with mobility OT goals addressed during session: Proper use of Adaptive equipment and DME;ADL's and self-care      AM-PAC OT "6 Clicks" Daily Activity     Outcome Measure Help from another person eating meals?: A Little Help from another person taking care of personal grooming?: A Little Help from another person toileting, which includes using toliet, bedpan, or urinal?: A Little Help from another person bathing (including washing, rinsing, drying)?: A Little Help from another person to put on and taking off regular upper body clothing?: A Lot Help from another person to put on and taking off regular lower body clothing?: A Little 6 Click Score: 17   End of Session Equipment Utilized During Treatment: Rollator (4 wheels);Oxygen Nurse Communication: Other (comment) (Rn approved OT to room. MD notiifed via secure chat re: LT shoulder concerns.)  Activity Tolerance: Patient tolerated treatment well Patient left: with chair alarm set;with call  bell/phone within reach;with family/visitor present  OT Visit Diagnosis: Pain;Unsteadiness on feet (R26.81) Pain - Right/Left: Left Pain - part of body: Shoulder;Arm                Time: 0454-0981 OT Time Calculation (min): 28 min Charges:  OT General Charges $OT Visit: 1 Visit OT Evaluation $OT Eval Low Complexity: 1 Low  Rasheen Schewe, OT Acute Rehab Services Office: [phone] 08/19/2023   Theodoro Clock 08/19/2023, 2:47 PM

## 2023-08-19 NOTE — Progress Notes (Signed)
 Pharmacy Antibiotic Note  Daniel Whitaker is a 78 y.o. adult admitted on 08/16/2023 with pneumonia.  Pharmacy has been consulted for cefepime dosing.  Today, 08/19/23 WBC WNL, afebrile. All cultures ngtd SCr <1  Today is day #4 of IV antibiotics.   Plan: Continue cefepime 2 g IV q8h Monitor renal function, culture data   Height: 6' (182.9 cm) Weight: 99 kg (218 lb 4.1 oz) (with x2 extra pillows) IBW/kg (Calculated) : 77.6  Temp (24hrs), Avg:98.2 F (36.8 C), Min:97.6 F (36.4 C), Max:98.5 F (36.9 C)  Recent Labs  Lab 08/13/23 0839 08/16/23 1757 08/16/23 1816 08/16/23 2049 08/17/23 0037 08/18/23 0741 08/19/23 0811  WBC 7.9 8.7  --   --  7.8 5.6 5.7  CREATININE 0.74 0.92  --   --  0.68 0.75 0.57*  LATICACIDVEN  --   --  1.8 1.3 0.9  --   --     Estimated Creatinine Clearance (by C-G formula based on SCr of 0.57 mg/dL (L)) Male: 40.9 mL/min (A) Male: 94.3 mL/min (A)    Allergies  Allergen Reactions   Atorvastatin Other (See Comments)    Made the hands ACHE   Cymbalta [Duloxetine Hcl] Nausea Only and Other (See Comments)    Drowsiness, also   Penicillins Hives    Antimicrobials this admission: 3/17 vancomycin >> 3/18 3/17 cefepime >>  3/18 azithromycin >> 3/20  Dose adjustments this admission:  Microbiology results: 3/17 BCx: ngtd 3/18 BCx: ngtd 3/17 MRSA PCR: Not detected  Cindi Carbon, PharmD 08/19/2023 1:56 PM

## 2023-08-19 NOTE — Plan of Care (Signed)
  Problem: Activity: Goal: Ability to tolerate increased activity will improve Outcome: Progressing   Problem: Clinical Measurements: Goal: Ability to maintain a body temperature in the normal range will improve Outcome: Progressing   Problem: Respiratory: Goal: Ability to maintain adequate ventilation will improve Outcome: Progressing Goal: Ability to maintain a clear airway will improve Outcome: Progressing   Problem: Education: Goal: Knowledge of disease or condition will improve Outcome: Progressing Goal: Understanding of medication regimen will improve Outcome: Progressing Goal: Individualized Educational Video(s) Outcome: Progressing   Problem: Activity: Goal: Ability to tolerate increased activity will improve Outcome: Progressing   Problem: Cardiac: Goal: Ability to achieve and maintain adequate cardiopulmonary perfusion will improve Outcome: Progressing   Problem: Health Behavior/Discharge Planning: Goal: Ability to safely manage health-related needs after discharge will improve Outcome: Progressing   Problem: Education: Goal: Knowledge of General Education information will improve Description: Including pain rating scale, medication(s)/side effects and non-pharmacologic comfort measures Outcome: Progressing   Problem: Health Behavior/Discharge Planning: Goal: Ability to manage health-related needs will improve Outcome: Progressing   Problem: Clinical Measurements: Goal: Ability to maintain clinical measurements within normal limits will improve Outcome: Progressing Goal: Will remain free from infection Outcome: Progressing Goal: Diagnostic test results will improve Outcome: Progressing Goal: Respiratory complications will improve Outcome: Progressing Goal: Cardiovascular complication will be avoided Outcome: Progressing   Problem: Activity: Goal: Risk for activity intolerance will decrease Outcome: Progressing   Problem: Nutrition: Goal: Adequate  nutrition will be maintained Outcome: Progressing   Problem: Coping: Goal: Level of anxiety will decrease Outcome: Progressing   Problem: Elimination: Goal: Will not experience complications related to bowel motility Outcome: Progressing Goal: Will not experience complications related to urinary retention Outcome: Progressing   Problem: Pain Managment: Goal: General experience of comfort will improve and/or be controlled Outcome: Progressing   Problem: Safety: Goal: Ability to remain free from injury will improve Outcome: Progressing   Problem: Skin Integrity: Goal: Risk for impaired skin integrity will decrease Outcome: Progressing

## 2023-08-19 NOTE — Progress Notes (Signed)
 PROGRESS NOTE    Daniel Whitaker  GUY:[id number] DOB: [birth year] DOA: 08/16/2023 PCP: [name], MD    Brief Narrative:77 y.o. adult with a history of chronic respiratory failure, multiple myeloma with bone involvement, anemia of chronic disease, hypertension, CAD s/p stent, hyperlipidemia, OSA, glaucoma. Patient presented secondary to fever and shortness of breath and found to have evidence of sepsis secondary to pneumonia, complicated by atrial fibrillation with RVR. Antibiotics started and amiodarone/heparin started for atrial fibrillation with reversion to sinus rhythm. Cardiology consulted.   Assessment & Plan:   Principal Problem:   Atrial fibrillation with RVR (HCC) Active Problems:   Multiple myeloma (HCC)   Cancer related pain   Sepsis due to pneumonia (HCC)   Chronic hypoxic respiratory failure (HCC)   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)   Obstructive sleep apnea   Anemia of chronic disease   Essential hypertension   Acute on chronic hypoxic respiratory failure (HCC)   Reactive airway disease   Demand ischemia (HCC)   Atrial fibrillation Newly diagnosed this admission.  Likely related to acute illness. Patient started on amiodarone and heparin with eventual cardioversion back to sinus rhythm. Amiodarone discontinued. Cardiology consulted. Transthoracic Echocardiogram significant for LVEF of 60-65% with severely dilated left atrium. -Cardiology recommendations: Coreg, Eliquis -Continue telemetry -TSH normal    Hypokalemia replete mag 1.9  Chest pain This appears to be related to known multiple myeloma   Sepsis Present on admission. Secondary to pneumonia. Blood cultures obtained on admission. Vancomycin and cefepime started empirically. MRSA PCR negative. -Continue Cefepime and azithromycin vancomycin stopped -Follow-up blood cultures negative   Community acquired pneumonia Present on admission. Chest x-ray significant  for patchy airspace disease.   Multiple myeloma Patient follows with oncology as an outpatient. Patient with bone involvement, including lesions leading to bilateral rib fractures. Complains of left shoulder discomfort and pain with OT will check x-rays   Chronic pain Secondary to bone lesions and fractures. Patient is on dexamethasone, Morphine and oxycodone -Continue outpatient regimen   Aortic dilation Noted on Transthoracic Echocardiogram, measuring 39 mm.   Elevated troponin In setting of atrial fibrillation with RVR and sepsis. Likely demand ischemia.   Primary hypertension -Continue Coreg  CAD History of coronary artery stent placement. Patient is managed on Coreg, aspirin, Plavix and Lipitor as an outpatient.   Pressure injury Medial coccyx. Present on admission.  Pressure Injury 08/16/23 Buttocks Right;Left Stage 2 -  Partial thickness loss of dermis presenting as a shallow open injury with a red, pink wound bed without slough. (Active)  08/16/23 2200  Location: Buttocks  Location Orientation: Right;Left  Staging: Stage 2 -  Partial thickness loss of dermis presenting as a shallow open injury with a red, pink wound bed without slough.  Wound Description (Comments):   Present on Admission: Yes  Dressing Type Foam - Lift dressing to assess site every shift 08/19/23 0800    Estimated body mass index is 29.6 kg/m as calculated from the following:   Height as of this encounter: 6' (1.829 m).   Weight as of this encounter: 99 kg.   DVT prophylaxis: Eliquis Code Status:   Code Status: Full Code Family Communication: Grandson at bedside  Disposition Plan: Discharge home pending culture data and transition to outpatient antibiotics     Consultants:  Cardiology oncology   Procedures:  Transthoracic Echocardiogram   Antimicrobials: Cefepime Azithromycin     Subjective: Slept well had a BM  Breathing better Objective: Vitals:  08/19/23 0800 08/19/23 1000  08/19/23 1200 08/19/23 1350  BP: 139/70 130/60    Pulse: 70 (!) 58    Resp: 15 13    Temp: 98.4 F (36.9 C)  97.6 F (36.4 C)   TempSrc: Oral  Oral   SpO2: 98% 99%  97%  Weight:      Height:        Intake/Output Summary (Last 24 hours) at 08/19/2023 1432 Last data filed at 08/19/2023 0900 Gross per 24 hour  Intake 203.48 ml  Output 3000 ml  Net -2796.52 ml   Filed Weights   08/16/23 1738 08/17/23 0315  Weight: 101 kg 99 kg    Examination:  General exam: Appears anxious  Respiratory system: Crackles to auscultation. Respiratory effort normal. Cardiovascular system: S1 & S2 heard, RRR. No JVD, murmurs, rubs, gallops or clicks. . Gastrointestinal system: Abdomen is nondistended, soft and nontender. No organomegaly or masses felt. Normal bowel sounds heard. Central nervous system: Alert and oriented. No focal neurological deficits. Extremities: Trace edema  Data Reviewed: I have personally reviewed following labs and imaging studies  CBC: Recent Labs  Lab 08/13/23 0839 08/16/23 1757 08/17/23 0037 08/18/23 0741 08/19/23 0811  WBC 7.9 8.7 7.8 5.6 5.7  NEUTROABS 5.1  --   --   --   --   HGB 9.2* 10.4* 8.5* 8.2* 9.1*  HCT 29.7* 33.0* 28.8* 26.9* 30.8*  MCV 89.2 89.4 93.8 91.2 93.1  PLT 254 268 238 235 253   Basic Metabolic Panel: Recent Labs  Lab 08/13/23 0839 08/16/23 1757 08/17/23 0037 08/18/23 0741 08/18/23 1429 08/19/23 0811  NA 139 138 136 138  --  137  K 4.4 3.9 3.5 3.4*  --  3.2*  CL 105 103 103 104  --  103  CO2 28 21* 22 24  --  25  GLUCOSE 99 178* 134* 97  --  87  BUN 30* 38* 33* 27*  --  29*  CREATININE 0.74 0.92 0.68 0.75  --  0.57*  CALCIUM 9.5 9.4 8.9 9.4  --  9.7  MG  --  1.9  --   --  2.0  --    GFR: Estimated Creatinine Clearance (by C-G formula based on SCr of 0.57 mg/dL (L)) Male: 16.1 mL/min (A) Male: 94.3 mL/min (A) Liver Function Tests: Recent Labs  Lab 08/13/23 0839 08/16/23 1757 08/17/23 0037 08/18/23 0741 08/19/23 0811   AST 10* 23 11* 12* 11*  ALT 16 18 15 14 15   ALKPHOS 107 93 75 77 76  BILITOT 0.3 0.8 0.6 0.5 0.6  PROT 7.6 8.0 6.9 7.2 7.6  ALBUMIN 3.3* 2.9* 2.5* 2.6* 2.8*   No results for input(s): "LIPASE", "AMYLASE" in the last 168 hours. No results for input(s): "AMMONIA" in the last 168 hours. Coagulation Profile: Recent Labs  Lab 08/16/23 1757  INR 1.2   Cardiac Enzymes: No results for input(s): "CKTOTAL", "CKMB", "CKMBINDEX", "TROPONINI" in the last 168 hours. BNP (last 3 results) No results for input(s): "PROBNP" in the last 8760 hours. HbA1C: No results for input(s): "HGBA1C" in the last 72 hours. CBG: No results for input(s): "GLUCAP" in the last 168 hours. Lipid Profile: No results for input(s): "CHOL", "HDL", "LDLCALC", "TRIG", "CHOLHDL", "LDLDIRECT" in the last 72 hours. Thyroid Function Tests: Recent Labs    08/17/23 1219  TSH 0.452   Anemia Panel: No results for input(s): "VITAMINB12", "FOLATE", "FERRITIN", "TIBC", "IRON", "RETICCTPCT" in the last 72 hours. Sepsis Labs: Recent Labs  Lab 08/16/23 1816 08/16/23  2045 08/16/23 2049 08/17/23 0037  PROCALCITON  --  0.40  --   --   LATICACIDVEN 1.8  --  1.3 0.9    Recent Results (from the past 240 hours)  Blood Culture (routine x 2)     Status: None (Preliminary result)   Collection Time: 08/16/23  5:57 PM   Specimen: BLOOD LEFT HAND  Result Value Ref Range Status   Specimen Description   Final    BLOOD LEFT HAND Performed at [facility], [address] [place], [place] [address]    Special Requests   Final    BOTTLES DRAWN AEROBIC AND ANAEROBIC Blood Culture adequate volume Performed at [facility], [address] [place], [place] [address]    Culture   Final    NO GROWTH 3 DAYS Performed at [facility], [address], [place], [place] [address]    Report Status PENDING  Incomplete  Resp panel by RT-PCR (RSV, Flu A&B, Covid) Anterior Nasal Swab     Status: None    Collection Time: 08/16/23  6:27 PM   Specimen: Anterior Nasal Swab  Result Value Ref Range Status   SARS Coronavirus 2 by RT PCR NEGATIVE NEGATIVE Final    Comment: (NOTE) SARS-CoV-2 target nucleic acids are NOT DETECTED.  The SARS-CoV-2 RNA is generally detectable in upper respiratory specimens during the acute phase of infection. The lowest concentration of SARS-CoV-2 viral copies this assay can detect is 138 copies/mL. A negative result does not preclude SARS-Cov-2 infection and should not be used as the sole basis for treatment or other patient management decisions. A negative result may occur with  improper specimen collection/handling, submission of specimen other than nasopharyngeal swab, presence of viral mutation(s) within the areas targeted by this assay, and inadequate number of viral copies(<138 copies/mL). A negative result must be combined with clinical observations, patient history, and epidemiological information. The expected result is Negative.  Fact Sheet for Patients:  BloggerCourse.com  Fact Sheet for Healthcare Providers:  SeriousBroker.it  This test is no t yet approved or cleared by the [place] FDA and  has been authorized for detection and/or diagnosis of SARS-CoV-2 by FDA under an Emergency Use Authorization (EUA). This EUA will remain  in effect (meaning this test can be used) for the duration of the COVID-19 declaration under Section 564(b)(1) of the Act, 21 U.S.C.section 360bbb-3(b)(1), unless the authorization is terminated  or revoked sooner.       Influenza A by PCR NEGATIVE NEGATIVE Final   Influenza B by PCR NEGATIVE NEGATIVE Final    Comment: (NOTE) The Xpert Xpress SARS-CoV-2/FLU/RSV plus assay is intended as an aid in the diagnosis of influenza from Nasopharyngeal swab specimens and should not be used as a sole basis for treatment. Nasal washings and aspirates are unacceptable for  Xpert Xpress SARS-CoV-2/FLU/RSV testing.  Fact Sheet for Patients: BloggerCourse.com  Fact Sheet for Healthcare Providers: SeriousBroker.it  This test is not yet approved or cleared by the [place] FDA and has been authorized for detection and/or diagnosis of SARS-CoV-2 by FDA under an Emergency Use Authorization (EUA). This EUA will remain in effect (meaning this test can be used) for the duration of the COVID-19 declaration under Section 564(b)(1) of the Act, 21 U.S.C. section 360bbb-3(b)(1), unless the authorization is terminated or revoked.     Resp Syncytial Virus by PCR NEGATIVE NEGATIVE Final    Comment: (NOTE) Fact Sheet for Patients: BloggerCourse.com  Fact Sheet for Healthcare Providers:  SeriousBroker.it  This test is not yet approved or cleared by the [place] and has been authorized for detection and/or diagnosis of SARS-CoV-2 by FDA under an Emergency Use Authorization (EUA). This EUA will remain in effect (meaning this test can be used) for the duration of the COVID-19 declaration under Section 564(b)(1) of the Act, 21 U.S.C. section 360bbb-3(b)(1), unless the authorization is terminated or revoked.  Performed at [facility], [address] [place], [place] [address]   MRSA Next Gen by PCR, Nasal     Status: None   Collection Time: 08/16/23 10:30 PM   Specimen: Nasal Mucosa; Nasal Swab  Result Value Ref Range Status   MRSA by PCR Next Gen NOT DETECTED NOT DETECTED Final    Comment: (NOTE) The GeneXpert MRSA Assay (FDA approved for NASAL specimens only), is one component of a comprehensive MRSA colonization surveillance program. It is not intended to diagnose MRSA infection nor to guide or monitor treatment for MRSA infections. Test performance is not FDA approved in patients less than 20 years old. Performed at [facility], [address], [place], [place] [address]   Blood Culture (routine x 2)     Status: None (Preliminary result)   Collection Time: 08/17/23 12:37 AM   Specimen: BLOOD RIGHT ARM  Result Value Ref Range Status   Specimen Description   Final    BLOOD RIGHT ARM Performed at [facility], [address] [place], [place] [address]    Special Requests   Final    BOTTLES DRAWN AEROBIC ONLY Blood Culture adequate volume Performed at [facility], [address], [place], [place] [address]    Culture   Final    NO GROWTH 2 DAYS Performed at [facility], [address] [place], [place] [address]    Report Status PENDING  Incomplete         Radiology Studies: No results found.       Scheduled Meds:  acyclovir  400 mg Oral Daily   apixaban  5 mg Oral BID   brimonidine  1 drop Both Eyes TID   calcium-vitamin D  1 tablet Oral BID   carvedilol  3.125 mg Oral BID   Chlorhexidine Gluconate Cloth  6 each Topical Nightly   dexamethasone  4 mg Oral Once per day on Sunday Tuesday Thursday   And   dexamethasone  2 mg Oral Once per day on Monday Wednesday   [START ON 08/20/2023] dexamethasone  2 mg Oral Once   docusate sodium  100 mg Oral BID   dorzolamide-timolol  1 drop Both Eyes BID   [START ON 08/20/2023] furosemide  40 mg Oral Daily   latanoprost  1 drop Both Eyes QHS   levalbuterol  0.63 mg Nebulization TID   morphine  15 mg Oral QHS   multivitamin with minerals  1 tablet Oral Q breakfast   pantoprazole  40 mg Oral QAC breakfast   polyethylene glycol  17 g Oral BID   potassium chloride  40 mEq Oral Once   pregabalin  100 mg Oral Daily   pregabalin  200 mg Oral QHS   rosuvastatin  40 mg Oral Daily   senna-docusate  2 tablet Oral BID   sodium chloride flush  3 mL Intravenous Q12H   sodium chloride flush  3 mL Intravenous Q12H   triamcinolone  2 spray Nasal Daily   Continuous Infusions:  ceFEPime (MAXIPIME) IV  Stopped (  08/19/23 0640)     LOS: 3 days    Time spent: 38 min   Alwyn Ren, MD 08/19/2023, 2:32 PM

## 2023-08-19 NOTE — Progress Notes (Signed)
 Pt has home CPAP at bedside.  He will self administer when he is ready for bed.

## 2023-08-19 NOTE — Progress Notes (Signed)
 Daniel Whitaker   DOB:23-Mar-1946   WU#:[id number]    ASSESSMENT & PLAN:  Multiple myeloma without remission (HCC) He has IgA kappa multiple myeloma with multiple bone fractures at presentation in 2024.  Bone marrow biopsy showed 15% involvement, standard risk myeloma FISH and cytogenetics He is not receiving Zometa, pending dental clearance He is able to proceed with dental extraction next week as he is on his week off of lenalidomide He will continue dexamethasone 2 mg alternate with 4 mg every other day He will continue vitamin D supplement and calcium He is on acyclovir for antimicrobial prophylaxis Will place his treatment on hold until he is discharged  Fluid retention He has significant fluid retention due to reintroduction of dexamethasone He has good response with diuretic therapy Continue the same  Cancer associated pain He has recent exacerbation of bone pain due to steroid withdrawal Pain control is improved reintroduction of dexamethasone and MS Contin at night He will continue oxycodone as needed along with acetaminophen  Atrial fibrillation with rapid ventricular response Continue medical management No bleeding complication from NOAC  Fever, resolved Healthcare associated infection is a possibility Agree with antibiotics 1 more day Discussed importance of pulmonary hygiene  Anemia Improved after diuresis Monitor closely  Discharge planning I am hopeful he can be discharged tomorrow  [name], MD 08/19/2023 1:20 PM  Subjective:  The patient is seen in the room with his grandson present.  He slept better last night.  Breathing has improved.  He has significant diuresis with IV Lasix.  His bone pain control is stable  Objective:  Vitals:   08/19/23 1000 08/19/23 1200  BP: 130/60   Pulse: (!) 58   Resp: 13   Temp:  97.6 F (36.4 C)  SpO2: 99%      Intake/Output Summary (Last 24 hours) at 08/19/2023 1320 Last data filed at 08/19/2023 0900 Gross per 24  hour  Intake 300.06 ml  Output 3400 ml  Net -3099.94 ml

## 2023-08-19 NOTE — Progress Notes (Deleted)
 Pt switched from hospice pca pump to hospital pca pump per orders.  Oncall hospice nurse consulted on how to stop pca pump and what to do with remaining fentanyl - instructed to just turn off pump and waste fentanyl per our facility policy; fentanyl from pt hospice pca pump wasted into stericycle bin with x2 RN Gwendolyn Fill RN/Amanda Claudette Laws RN).  140 mL wasted.

## 2023-08-20 ENCOUNTER — Other Ambulatory Visit: Payer: Self-pay | Admitting: Hematology and Oncology

## 2023-08-20 ENCOUNTER — Inpatient Hospital Stay

## 2023-08-20 ENCOUNTER — Encounter: Payer: Self-pay | Admitting: Hematology and Oncology

## 2023-08-20 ENCOUNTER — Inpatient Hospital Stay: Admitting: Hematology and Oncology

## 2023-08-20 DIAGNOSIS — I4891 Unspecified atrial fibrillation: Secondary | ICD-10-CM | POA: Diagnosis not present

## 2023-08-20 DIAGNOSIS — C9000 Multiple myeloma not having achieved remission: Secondary | ICD-10-CM

## 2023-08-20 LAB — COMPREHENSIVE METABOLIC PANEL
ALT: 14 U/L (ref 0–44)
AST: 12 U/L — ABNORMAL LOW (ref 15–41)
Albumin: 2.7 g/dL — ABNORMAL LOW (ref 3.5–5.0)
Alkaline Phosphatase: 72 U/L (ref 38–126)
Anion gap: 10 (ref 5–15)
BUN: 30 mg/dL — ABNORMAL HIGH (ref 8–23)
CO2: 24 mmol/L (ref 22–32)
Calcium: 10 mg/dL (ref 8.9–10.3)
Chloride: 106 mmol/L (ref 98–111)
Creatinine, Ser: 0.79 mg/dL (ref 0.61–1.24)
GFR, Estimated: >60 mL/min (ref >60)
Glucose, Bld: 83 mg/dL (ref 70–99)
Potassium: 3.6 mmol/L (ref 3.5–5.1)
Sodium: 140 mmol/L (ref 135–145)
Total Bilirubin: 0.5 mg/dL (ref 0.0–1.2)
Total Protein: 7.6 g/dL (ref 6.5–8.1)

## 2023-08-20 LAB — CBC
HCT: 28.6 % — ABNORMAL LOW (ref 39.0–52.0)
Hemoglobin: 8.7 g/dL — ABNORMAL LOW (ref 13.0–17.0)
MCH: 27.4 pg (ref 26.0–34.0)
MCHC: 30.4 g/dL (ref 30.0–36.0)
MCV: 89.9 fL (ref 80.0–100.0)
Platelets: 264 10*3/uL (ref 150–400)
RBC: 3.18 MIL/uL — ABNORMAL LOW (ref 4.22–5.81)
RDW: 17 % — ABNORMAL HIGH (ref 11.5–15.5)
WBC: 5.7 10*3/uL (ref 4.0–10.5)
nRBC: 0 % (ref 0.0–0.2)

## 2023-08-20 MED ORDER — LEVOFLOXACIN 750 MG PO TABS: 750.0000 mg | ORAL_TABLET | Freq: Every day | ORAL | 0 refills | Status: DC

## 2023-08-20 MED ORDER — DEXAMETHASONE 4 MG PO TABS
4.0000 mg | ORAL_TABLET | Freq: Once | ORAL | Status: AC
  Administered 2023-08-20: 4 mg via ORAL

## 2023-08-20 MED ORDER — FUROSEMIDE 10 MG/ML IJ SOLN
40.0000 mg | Freq: Every day | INTRAMUSCULAR | Status: DC
  Administered 2023-08-20: 40 mg via INTRAVENOUS
  Filled 2023-08-20: qty 4

## 2023-08-20 MED ORDER — APIXABAN 5 MG PO TABS
5.0000 mg | ORAL_TABLET | Freq: Two times a day (BID) | ORAL | 2 refills | Status: DC
Start: 2023-08-20 — End: 2023-11-02

## 2023-08-20 NOTE — Plan of Care (Signed)
  Problem: Clinical Measurements: Goal: Ability to maintain a body temperature in the normal range will improve Outcome: Progressing   Problem: Respiratory: Goal: Ability to maintain adequate ventilation will improve Outcome: Progressing Goal: Ability to maintain a clear airway will improve Outcome: Progressing   Problem: Clinical Measurements: Goal: Ability to maintain clinical measurements within normal limits will improve Outcome: Progressing Goal: Diagnostic test results will improve Outcome: Progressing Goal: Respiratory complications will improve Outcome: Progressing Goal: Cardiovascular complication will be avoided Outcome: Progressing   Problem: Coping: Goal: Level of anxiety will decrease Outcome: Progressing   Problem: Pain Managment: Goal: General experience of comfort will improve and/or be controlled Outcome: Progressing

## 2023-08-20 NOTE — Discharge Summary (Addendum)
 Physician Discharge Summary  Daniel Whitaker:[id number] DOB: 10/15/1945 DOA: 08/16/2023  PCP: [name], MD  Admit date: 08/16/2023 Discharge date: 08/20/2023  Admitted From: Home Disposition: Home Home  Recommendations for Outpatient Follow-up:  Follow up with PCP in 1-2 weeks Please obtain BMP/CBC in one week Please follow up with Dr. Bertis Ruddy  Home Health: Yes Equipment/Devices none Discharge Condition: Stable CODE STATUS: Full code Diet recommendation: Cardiac Brief/Interim Summary: 79 y.o. male with a history of chronic respiratory failure, multiple myeloma with bone involvement, anemia of chronic disease, hypertension, CAD s/p stent, hyperlipidemia, OSA, glaucoma. Patient presented secondary to fever and shortness of breath and found to have evidence of sepsis secondary to pneumonia, complicated by atrial fibrillation with RVR. Antibiotics started and amiodarone/heparin started for atrial fibrillation with reversion to sinus rhythm. Cardiology consulted.  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  Patient was admitted to telemetry/stepdown unit started on amiodarone and heparin drip with eventual conversion back to sinus rhythm.  Cardiology was consulted placed patient on Eliquis and continued the Coreg he was taking at home.  His rate was controlled with Coreg. Transthoracic Echocardiogram significant for LVEF of 60-65% with severely dilated left atrium. -TSH normal     Hypokalemia resolved   Chest pain This appears to be related to known multiple myeloma   Sepsis Present on admission. Secondary to pneumonia. Blood cultures obtained on admission remained negative.  Vancomycin and cefepime started empirically. MRSA PCR negative.  Comycin was stopped and cefepime was continued till discharge.  He was discharged on Levaquin 750 mg once a day for 5 more days.   Community acquired pneumonia Present on admission. Chest x-ray significant for patchy airspace disease.   Multiple myeloma Patient follows with oncology as an outpatient. Patient with bone involvement, including lesions leading to bilateral rib fractures. Complains of left shoulder discomfort and pain with OT  X-ray of his shoulder left shows lucent lesions along the proximal left humeral shaft.  Osteopenia multiple old left rib fractures   Chronic pain Secondary to bone lesions and fractures. Patient is on dexamethasone, Morphine and oxycodone -Continue outpatient regimen   Aortic dilation Noted on Transthoracic Echocardiogram, measuring 39 mm.   Elevated troponin In setting of atrial fibrillation with RVR and sepsis. Likely demand ischemia.   Primary hypertension -Continue Coreg  CAD History of coronary artery stent placement. Patient is managed on Coreg, aspirin, Plavix and Lipitor as an outpatient.   Pressure injury Medial coccyx. Present on admission.    Estimated body mass index is 29.72 kg/m as calculated from the following:   Height as of this encounter: 6' (1.829 m).   Weight as of this encounter: 99.4 kg.  Discharge Instructions  Discharge Instructions     Diet - low sodium heart healthy   Complete by: As directed    Increase activity slowly   Complete by: As directed    No wound care   Complete by: As directed       Allergies as  of 08/20/2023       Reactions   Atorvastatin Other (See Comments)   Made the hands ACHE   Cymbalta [duloxetine Hcl] Nausea Only,  Other (See Comments)   Drowsiness, also   Penicillins Hives        Medication List     STOP taking these medications    aspirin EC 325 MG tablet   azithromycin 250 MG tablet Commonly known as: ZITHROMAX   carvedilol 3.125 MG tablet Commonly known as: COREG   rosuvastatin 40 MG tablet Commonly known as: CRESTOR   spironolactone 25 MG tablet Commonly known as: Aldactone       TAKE these medications    acyclovir 400 MG tablet Commonly known as: ZOVIRAX Take 1 tablet (400 mg total) by mouth daily.   albuterol 108 (90 Base) MCG/ACT inhaler Commonly known as: VENTOLIN HFA Inhale 1-2 puffs into the lungs every 6 (six) hours as needed for wheezing or shortness of breath.   apixaban 5 MG Tabs tablet Commonly known as: ELIQUIS Take 1 tablet (5 mg total) by mouth 2 (two) times daily.   brimonidine 0.2 % ophthalmic solution Commonly known as: ALPHAGAN Place 1 drop into the left eye in the morning and at bedtime.   Centrum Silver 50+Men Tabs Take 1 tablet by mouth daily with breakfast.   cetirizine 10 MG tablet Commonly known as: ZYRTEC Take 10 mg by mouth every morning.   cyclobenzaprine 10 MG tablet Commonly known as: FLEXERIL Take 10 mg by mouth daily as needed for muscle spasms.   Darzalex Faspro 1800-30000 MG-UT/15ML Soln Generic drug: daratumumab-hyaluronidase-fihj Inject 1,800 mg into the skin once a week. Every friday   Denta 5000 Plus 1.1 % Crea dental cream Generic drug: sodium fluoride Place 1 Application onto teeth at bedtime.   dexamethasone 4 MG tablet Commonly known as: DECADRON Take 1 pill daily except 4 pills every Friday What changed: additional instructions   docusate sodium 100 MG capsule Commonly known as: COLACE Take 300 mg by mouth daily.   dorzolamide-timolol 2-0.5 % ophthalmic solution Commonly known as: COSOPT Place 1 drop into both eyes 2 (two) times daily.   furosemide 40 MG tablet Commonly known as: LASIX Take 40 mg in  the morning and 20 mg in the afternoon   ketoconazole 2 % cream Commonly known as: NIZORAL Apply 1 application  topically daily as needed for irritation.   latanoprost 0.005 % ophthalmic solution Commonly known as: XALATAN Place 1 drop into both eyes at bedtime.   lenalidomide 10 MG capsule Commonly known as: REVLIMID Take 1 capsule (10 mg total) by mouth daily. Take 1 capsule daily for 14 days on and 7 days off, repeat every 21 days. [employer] Auth #   [id number] Date Obtained 07/29/23   levofloxacin 750 MG tablet Commonly known as: Levaquin Take 1 tablet (750 mg total) by mouth daily for 5 days.   lidocaine 5 % ointment Commonly known as: XYLOCAINE Apply 1 Application topically 2 (two) times daily as needed. What changed:  when to take this reasons to take this   morphine 15 MG 12 hr tablet Commonly known as: MS CONTIN Take 15 mg by mouth at bedtime.   nitroGLYCERIN 0.4 MG SL tablet Commonly known as: Nitrostat Place 1 tablet (0.4 mg total) under the tongue every 5 (five) minutes as needed. What changed: reasons to take this   ondansetron 8 MG tablet Commonly known as: Zofran Take 1 tablet (8 mg total) by mouth every 8 (eight) hours  as needed for nausea or vomiting.   Oxycodone HCl 10 MG Tabs Take 1 tablet (10 mg total) by mouth every 3 (three) hours as needed. What changed: when to take this   pantoprazole 40 MG tablet Commonly known as: PROTONIX Take 40 mg by mouth daily before breakfast.   polyethylene glycol 17 g packet Commonly known as: MIRALAX / GLYCOLAX Take 17 g by mouth 2 (two) times daily.   pregabalin 100 MG capsule Commonly known as: LYRICA TAKE 1 CAPSULE BY MOUTH IN THE MORNING, AND AT NOON, AND 2 CAPSULES AT NIGHT What changed:  how much to take how to take this when to take this additional instructions   prochlorperazine 10 MG tablet Commonly known as: COMPAZINE Take 1 tablet (10 mg total) by mouth every 6 (six) hours as needed for nausea or  vomiting.   senna-docusate 8.6-50 MG tablet Commonly known as: Senokot-S Take 2 tablets by mouth 2 (two) times daily.   SSD 1 % cream Generic drug: silver sulfADIAZINE Apply 1 Application topically daily.   triamcinolone 55 MCG/ACT Aero nasal inhaler Commonly known as: NASACORT Place 2 sprays into the nose daily. What changed:  how much to take when to take this reasons to take this   TYLENOL 500 MG tablet Generic drug: acetaminophen Take 1,000 mg by mouth every 6 (six) hours as needed for mild pain (pain score 1-3) or headache.        Follow-up Information     [name], MD Follow up.   Specialty: Family Medicine Contact information: [address] [place] [address] [phone]         [name], MD Follow up.   Specialty: Hematology and Oncology Contact information: [address] [place] [address] [phone]         [facility] Follow up.   Why: Iantha Fallen will provide PT and OT in the home after discharge. Contact information: [address] [place] [address] [phone]         [name], MD Follow up on 09/06/2023.   Specialty: Cardiology Why: at 11:20am for your cardiology follow up appointment Contact information: [address] [place] [address] [phone]                Allergies  Allergen Reactions   Atorvastatin Other (See Comments)    Made the hands ACHE   Cymbalta [Duloxetine Hcl] Nausea Only and Other (See Comments)    Drowsiness, also   Penicillins Hives    Consultations:oncology cardiology  Procedures/Studies: DG Shoulder Left Result Date: 08/19/2023 CLINICAL DATA:  Pain.  No history of trauma reported EXAM: LEFT SHOULDER - 3 VIEW COMPARISON:  None Available. FINDINGS: Joint space loss and sclerosis of the [facility] joint. Preserved glenohumeral joint. No fracture or dislocation of the shoulder. Osteopenia. Multiple  left-sided rib deformities. Please correlate with separate x-ray of the chest from 08/16/2023. there is some areas of lucency along the shaft of the humerus. Some of these are subcortical. IMPRESSION: Degenerative changes of the [facility] joint. Osteopenia. Multiple old left rib fractures. Lucent lesions along the proximal left humeral shaft. Some of these are subcortical. Please correlate for any known history such as multiple myeloma. Electronically Signed   By: Karen Kays M.D.   On: 08/19/2023 16:49   ECHOCARDIOGRAM COMPLETE Result Date: 08/17/2023    ECHOCARDIOGRAM REPORT   Patient Name:   BEE HAMMERSCHMIDT Date of Exam: 08/17/2023 Medical Rec #:  [id number]        Height:       72.0 in Accession #:    [id number]       Weight:       218.3 lb Date of Birth:  11-Jun-1945       BSA:          2.211 m Patient Age:    77 years         BP:           130/51 mmHg Patient Gender: M                HR:           63 bpm. Exam Location:  Inpatient Procedure: 2D Echo, Cardiac Doppler, Color Doppler and Intracardiac            Opacification Agent (Both Spectral and Color Flow Doppler were            utilized during procedure). Indications:    A fib  History:        Patient has prior history of Echocardiogram examinations, most                 recent 07/20/2023. CAD, Arrythmias:Atrial Fibrillation; Risk                 Factors:Hypertension.  Sonographer:    Amy Chionchio Referring Phys: [id number] SUBRINA SUNDIL IMPRESSIONS  1. Left ventricular ejection fraction, by estimation, is 60 to 65%. The left ventricle has normal function. The left ventricle has no regional wall motion abnormalities. Left ventricular diastolic parameters were normal.  2. Right ventricular systolic function is normal. The right ventricular size is normal.  3. Left atrial size was severely dilated.  4. The mitral valve is normal in structure. Mild mitral valve regurgitation. No evidence of mitral stenosis.  5. The aortic valve is tricuspid. Aortic valve  regurgitation is not visualized. Aortic valve sclerosis is present, with no evidence of aortic valve stenosis.  6. Aortic dilatation noted. There is mild dilatation of the ascending aorta, measuring 39 mm. FINDINGS  Left Ventricle: Left ventricular ejection fraction, by estimation, is 60 to 65%. The left ventricle has normal function. The left ventricle has no regional wall motion abnormalities. Definity contrast agent was given IV to delineate the left ventricular  endocardial borders. The left ventricular internal cavity size was normal in size. There is no left ventricular hypertrophy. Left ventricular diastolic parameters were normal. Right Ventricle: The right ventricular size is normal. Right ventricular systolic function is normal. Left Atrium: Left atrial size was severely dilated. Right Atrium: Right atrial size was normal in size. Pericardium: There is no evidence of pericardial effusion. Mitral Valve: The mitral valve is normal in structure. Mild mitral valve regurgitation. No evidence of mitral valve stenosis. MV peak gradient, 4.2 mmHg. The mean mitral valve gradient is 1.0 mmHg. Tricuspid Valve: The tricuspid valve is normal in structure. Tricuspid valve regurgitation is mild . No evidence of tricuspid stenosis. Aortic Valve: The aortic valve is tricuspid. Aortic valve regurgitation is not visualized. Aortic valve sclerosis is present, with no evidence of aortic valve stenosis. Aortic valve mean gradient measures 4.0 mmHg. Aortic valve peak gradient measures 8.5  mmHg. Aortic valve area, by VTI measures 2.66 cm. Pulmonic Valve: The pulmonic valve was not well visualized. Pulmonic valve regurgitation is not visualized. No evidence of pulmonic stenosis. Aorta: Aortic dilatation noted. There is mild dilatation of the ascending aorta, measuring 39 mm. Venous: The inferior vena  cava was not well visualized. IAS/Shunts: The interatrial septum was not well visualized.  LEFT VENTRICLE PLAX 2D LVIDd:          5.00 cm      Diastology LVIDs:         2.80 cm      LV e' medial:    7.62 cm/s LV PW:         0.80 cm      LV E/e' medial:  11.9 LV IVS:        0.80 cm      LV e' lateral:   9.90 cm/s LVOT diam:     2.10 cm      LV E/e' lateral: 9.2 LV SV:         80 LV SV Index:   36 LVOT Area:     3.46 cm  LV Volumes (MOD) LV vol d, MOD A4C: 195.0 ml LV vol s, MOD A4C: 82.6 ml LV SV MOD A4C:     195.0 ml RIGHT VENTRICLE RV Basal diam:  5.10 cm TAPSE (M-mode): 2.0 cm LEFT ATRIUM              Index        RIGHT ATRIUM           Index LA Vol (A2C):   162.0 ml 73.27 ml/m  RA Area:     21.80 cm LA Vol (A4C):   146.5 ml 66.26 ml/m  RA Volume:   67.00 ml  30.30 ml/m LA Biplane Vol: 150.0 ml 67.84 ml/m  AORTIC VALVE                    PULMONIC VALVE AV Area (Vmax):    2.60 cm     PV Vmax:       0.90 m/s AV Area (Vmean):   2.50 cm     PV Peak grad:  3.2 mmHg AV Area (VTI):     2.66 cm AV Vmax:           146.00 cm/s AV Vmean:          97.500 cm/s AV VTI:            0.299 m AV Peak Grad:      8.5 mmHg AV Mean Grad:      4.0 mmHg LVOT Vmax:         109.50 cm/s LVOT Vmean:        70.350 cm/s LVOT VTI:          0.230 m LVOT/AV VTI ratio: 0.77  AORTA Ao Root diam: 3.50 cm Ao Asc diam:  4.10 cm MITRAL VALVE               TRICUSPID VALVE MV Area (PHT): 3.34 cm    TR Peak grad:   38.2 mmHg MV Area VTI:   2.58 cm    TR Vmax:        309.00 cm/s MV Peak grad:  4.2 mmHg MV Mean grad:  1.0 mmHg    SHUNTS MV Vmax:       1.02 m/s    Systemic VTI:  0.23 m MV Vmean:      58.1 cm/s   Systemic Diam: 2.10 cm MV Decel Time: 227 msec MV E velocity: 90.80 cm/s MV A velocity: 62.40 cm/s MV E/A ratio:  1.46 Olga Millers MD Electronically signed by Olga Millers MD Signature Date/Time: 08/17/2023/11:31:41 AM    Final    DG Chest [facility]  1 View Result Date: 08/16/2023 CLINICAL DATA:  Shortness of breath and fever. Current chemotherapy. History of multiple myeloma previous exam. EXAM: PORTABLE CHEST 1 VIEW COMPARISON:  07/25/2023, 07/26/2023. FINDINGS:  The heart is enlarged and mediastinal contours is stable. Lung volumes are low with patchy airspace disease at the lung bases, increased from the prior exam. There are small bilateral pleural effusions. No pneumothorax is seen. Multiple lytic lesions are seen within the bones. Bilateral rib fractures are noted and unchanged from the previous exam. IMPRESSION: 1. Increased patchy airspace disease at the lung bases, possible atelectasis or infiltrate. 2. Small bilateral pleural effusions. 3. Stable lytic lesions throughout the bones with bilateral rib fractures and compatible with history of multiple myeloma. Electronically Signed   By: Thornell Sartorius M.D.   On: 08/16/2023 19:29   DG Tibia/Fibula Right Result Date: 07/26/2023 CLINICAL DATA:  Pain EXAM: RIGHT TIBIA AND FIBULA - 2 VIEW COMPARISON:  None Available. FINDINGS: Frontal and lateral views of the right tibia and fibula are obtained. There are no acute or destructive bony abnormalities. Alignment is anatomic. Mild 3 compartmental osteoarthritis of the right knee, greatest in the lateral compartment. Soft tissues are unremarkable. IMPRESSION: 1. No acute bony abnormality. 2. Mild 3 compartmental right knee osteoarthritis. Electronically Signed   By: Sharlet Salina M.D.   On: 07/26/2023 15:42   CT Angio Chest Pulmonary Embolism (PE) W or WO Contrast Result Date: 07/26/2023 CLINICAL DATA:  Evaluate for acute pulmonary embolism. High probability. Chest pain with shortness of breath and cough. History of multiple myeloma. EXAM: CT ANGIOGRAPHY CHEST WITH CONTRAST TECHNIQUE: Multidetector CT imaging of the chest was performed using the standard protocol during bolus administration of intravenous contrast. Multiplanar CT image reconstructions and MIPs were obtained to evaluate the vascular anatomy. RADIATION DOSE REDUCTION: This exam was performed according to the departmental dose-optimization program which includes automated exposure control, adjustment of the  mA and/or kV according to patient size and/or use of iterative reconstruction technique. CONTRAST:  80mL OMNIPAQUE IOHEXOL 350 MG/ML SOLN COMPARISON:  05/03/2023 FINDINGS: Cardiovascular: There is satisfactory opacification of the pulmonary arteries. However, there is excessive motion artifact which diminishes exam detail beyond the level of the lobar pulmonary arteries. Within these limitations, no central obstructing pulmonary embolus identified. Aortic atherosclerosis. Coronary artery calcifications. Mediastinum/Nodes: Thyroid gland, trachea, and esophagus are unremarkable. No enlarged mediastinal, hilar, or axillary adenopathy. Lungs/Pleura: Motion artifact diminishes exam detail. Subsegmental atelectasis is identified within both lower lobes with associated volume loss. No significant pleural effusion or airspace consolidation. No pneumothorax identified. Upper Abdomen: No acute abnormality. Musculoskeletal: There is extensive lucent bone lesions identified throughout the bony thorax compatible with history of multiple myeloma. Compared with the previous exam there is been marked progression of disease with increase multiplicity and size of previously noted lucent bone lesions. As noted previously there are numerous bilateral rib fractures of varying ages many of which are likely pathologic in nature given the extent multiple myeloma. Compared with the previous exam some of these fractures are new for example, there is a new fracture involving the posterolateral aspect of the left tenth rib, image 120/6. Review of the MIP images confirms the above findings. IMPRESSION: 1. Exam is limited by excessive motion artifact which diminishes exam detail beyond the level of the lobar pulmonary arteries. Within these limitations, no central obstructing pulmonary embolus identified. 2. Extensive lucent bone lesions identified throughout the bony thorax compatible with history of multiple myeloma. Compared with the previous  exam there has been  marked progression of disease with increase multiplicity and size of previously noted lucent bone lesions. 3. Numerous bilateral rib fractures of varying ages, some of which are likely pathologic in nature given the extent multiple myeloma. Compared with the previous exam some of these fractures are new none. 4. Coronary artery calcifications. 5.  Aortic Atherosclerosis (ICD10-I70.0). Electronically Signed   By: Signa Kell M.D.   On: 07/26/2023 10:37   DG Chest Port 1 View Result Date: 07/25/2023 CLINICAL DATA:  Chest pain with shortness of breath and cough. EXAM: PORTABLE CHEST 1 VIEW COMPARISON:  07/23/2023 FINDINGS: The cardio pericardial silhouette is enlarged. Similar bibasilar atelectasis or infiltrate with probable small bilateral pleural effusions. No overt pulmonary edema. Old left-sided rib fractures noted. Telemetry leads overlie the chest. IMPRESSION: Similar bibasilar atelectasis or infiltrate with probable small bilateral pleural effusions. Electronically Signed   By: Kennith Center M.D.   On: 07/25/2023 09:36   DG Chest Port 1 View Result Date: 07/23/2023 CLINICAL DATA:  Shortness of breath and hypoxia EXAM: PORTABLE CHEST 1 VIEW COMPARISON:  Chest radiograph dated 07/19/2023 FINDINGS: Normal lung volumes. Mild bilateral interstitial opacities and bibasilar patchy and linear opacities. Blunting of the bilateral costophrenic angles. No pneumothorax. Similar enlarged cardiomediastinal silhouette. Old bilateral rib fractures. IMPRESSION: 1. Mild bilateral interstitial opacities and bibasilar patchy and linear opacities, which may represent pulmonary edema or atypical infection. 2. Blunting of the bilateral costophrenic angles, which may represent small pleural effusions. 3. Similar cardiomegaly. Electronically Signed   By: Agustin Cree M.D.   On: 07/23/2023 10:45   (Echo, Carotid, EGD, Colonoscopy, ERCP)    Subjective:   Discharge Exam: Vitals:   08/20/23 0920 08/20/23  1205  BP:  120/67  Pulse:  64  Resp:  20  Temp:  97.6 F (36.4 C)  SpO2: 96% 97%   Vitals:   08/20/23 0500 08/20/23 0737 08/20/23 0920 08/20/23 1205  BP:  127/64  120/67  Pulse:  66  64  Resp:  20  20  Temp:  97.7 F (36.5 C)  97.6 F (36.4 C)  TempSrc:  Oral  Oral  SpO2:  95% 96% 97%  Weight: 99.4 kg     Height:        General: Pt is alert, awake, not in acute distress Cardiovascular: RRR, S1/S2 +, no rubs, no gallops Respiratory: CTA bilaterally, no wheezing, no rhonchi Abdominal: Soft, NT, ND, bowel sounds + Extremities: no edema, no cyanosis    The results of significant diagnostics from this hospitalization (including imaging, microbiology, ancillary and laboratory) are listed below for reference.     Microbiology: Recent Results (from the past 240 hours)  Blood Culture (routine x 2)     Status: None (Preliminary result)   Collection Time: 08/16/23  5:57 PM   Specimen: BLOOD LEFT HAND  Result Value Ref Range Status   Specimen Description   Final    BLOOD LEFT HAND Performed at [facility], [address] [place], [place] [address]    Special Requests   Final    BOTTLES DRAWN AEROBIC AND ANAEROBIC Blood Culture adequate volume Performed at [facility], [address], [place], [place] [address]    Culture   Final    NO GROWTH 4 DAYS Performed at [facility], [address] [place], [place] [address]    Report Status PENDING  Incomplete  Resp panel by RT-PCR (RSV, Flu A&B, Covid) Anterior Nasal Swab     Status: None  Collection Time: 08/16/23  6:27 PM   Specimen: Anterior Nasal Swab  Result Value Ref Range Status   SARS Coronavirus 2 by RT PCR NEGATIVE NEGATIVE Final    Comment: (NOTE) SARS-CoV-2 target nucleic acids are NOT DETECTED.  The SARS-CoV-2 RNA is generally detectable in upper respiratory specimens during the acute phase of infection. The lowest concentration of SARS-CoV-2 viral copies this assay can  detect is 138 copies/mL. A negative result does not preclude SARS-Cov-2 infection and should not be used as the sole basis for treatment or other patient management decisions. A negative result may occur with  improper specimen collection/handling, submission of specimen other than nasopharyngeal swab, presence of viral mutation(s) within the areas targeted by this assay, and inadequate number of viral copies(<138 copies/mL). A negative result must be combined with clinical observations, patient history, and epidemiological information. The expected result is Negative.  Fact Sheet for Patients:  BloggerCourse.com  Fact Sheet for Healthcare Providers:  SeriousBroker.it  This test is no t yet approved or cleared by the [place] FDA and  has been authorized for detection and/or diagnosis of SARS-CoV-2 by FDA under an Emergency Use Authorization (EUA). This EUA will remain  in effect (meaning this test can be used) for the duration of the COVID-19 declaration under Section 564(b)(1) of the Act, 21 U.S.C.section 360bbb-3(b)(1), unless the authorization is terminated  or revoked sooner.       Influenza A by PCR NEGATIVE NEGATIVE Final   Influenza B by PCR NEGATIVE NEGATIVE Final    Comment: (NOTE) The Xpert Xpress SARS-CoV-2/FLU/RSV plus assay is intended as an aid in the diagnosis of influenza from Nasopharyngeal swab specimens and should not be used as a sole basis for treatment. Nasal washings and aspirates are unacceptable for Xpert Xpress SARS-CoV-2/FLU/RSV testing.  Fact Sheet for Patients: BloggerCourse.com  Fact Sheet for Healthcare Providers: SeriousBroker.it  This test is not yet approved or cleared by the [place] FDA and has been authorized for detection and/or diagnosis of SARS-CoV-2 by FDA under an Emergency Use Authorization (EUA). This EUA will remain in  effect (meaning this test can be used) for the duration of the COVID-19 declaration under Section 564(b)(1) of the Act, 21 U.S.C. section 360bbb-3(b)(1), unless the authorization is terminated or revoked.     Resp Syncytial Virus by PCR NEGATIVE NEGATIVE Final    Comment: (NOTE) Fact Sheet for Patients: BloggerCourse.com  Fact Sheet for Healthcare Providers: SeriousBroker.it  This test is not yet approved or cleared by the [place] FDA and has been authorized for detection and/or diagnosis of SARS-CoV-2 by FDA under an Emergency Use Authorization (EUA). This EUA will remain in effect (meaning this test can be used) for the duration of the COVID-19 declaration under Section 564(b)(1) of the Act, 21 U.S.C. section 360bbb-3(b)(1), unless the authorization is terminated or revoked.  Performed at [facility], [address] [place], [place] [address]   MRSA Next Gen by PCR, Nasal     Status: None   Collection Time: 08/16/23 10:30 PM   Specimen: Nasal Mucosa; Nasal Swab  Result Value Ref Range Status   MRSA by PCR Next Gen NOT DETECTED NOT DETECTED Final    Comment: (NOTE) The GeneXpert MRSA Assay (FDA approved for NASAL specimens only), is one component of a comprehensive MRSA colonization surveillance program. It is not intended to diagnose MRSA infection nor to guide or monitor treatment for MRSA infections. Test performance is not FDA approved in patients less than 2 years  old. Performed at [facility], [address] [place], [place] [address]   Blood Culture (routine x 2)     Status: None (Preliminary result)   Collection Time: 08/17/23 12:37 AM   Specimen: BLOOD RIGHT ARM  Result Value Ref Range Status   Specimen Description   Final    BLOOD RIGHT ARM Performed at [facility], [address] [place], [place] [address]    Special Requests   Final     BOTTLES DRAWN AEROBIC ONLY Blood Culture adequate volume Performed at [facility], [address] [place], [place] [address]    Culture   Final    NO GROWTH 3 DAYS Performed at [facility], [address] [place], [place] [address]    Report Status PENDING  Incomplete     Labs: BNP (last 3 results) Recent Labs    07/23/23 1016 07/25/23 1133 08/17/23 1219  BNP 116.3* 210.9* 265.3*   Basic Metabolic Panel: Recent Labs  Lab 08/16/23 1757 08/17/23 0037 08/18/23 0741 08/18/23 1429 08/19/23 0811 08/20/23 0500  NA 138 136 138  --  137 140  K 3.9 3.5 3.4*  --  3.2* 3.6  CL 103 103 104  --  103 106  CO2 21* 22 24  --  25 24  GLUCOSE 178* 134* 97  --  87 83  BUN 38* 33* 27*  --  29* 30*  CREATININE 0.92 0.68 0.75  --  0.57* 0.79  CALCIUM 9.4 8.9 9.4  --  9.7 10.0  MG 1.9  --   --  2.0  --   --    Liver Function Tests: Recent Labs  Lab 08/16/23 1757 08/17/23 0037 08/18/23 0741 08/19/23 0811 08/20/23 0500  AST 23 11* 12* 11* 12*  ALT 18 15 14 15 14   ALKPHOS 93 75 77 76 72  BILITOT 0.8 0.6 0.5 0.6 0.5  PROT 8.0 6.9 7.2 7.6 7.6  ALBUMIN 2.9* 2.5* 2.6* 2.8* 2.7*   No results for input(s): "LIPASE", "AMYLASE" in the last 168 hours. No results for input(s): "AMMONIA" in the last 168 hours. CBC: Recent Labs  Lab 08/16/23 1757 08/17/23 0037 08/18/23 0741 08/19/23 0811 08/20/23 0500  WBC 8.7 7.8 5.6 5.7 5.7  HGB 10.4* 8.5* 8.2* 9.1* 8.7*  HCT 33.0* 28.8* 26.9* 30.8* 28.6*  MCV 89.4 93.8 91.2 93.1 89.9  PLT 268 238 235 253 264   Cardiac Enzymes: No results for input(s): "CKTOTAL", "CKMB", "CKMBINDEX", "TROPONINI" in the last 168 hours. BNP: Invalid input(s): "POCBNP" CBG: No results for input(s): "GLUCAP" in the last 168 hours. D-Dimer No results for input(s): "DDIMER" in the last 72 hours. Hgb A1c No results for input(s): "HGBA1C" in the last 72 hours. Lipid Profile No results for input(s): "CHOL", "HDL", "LDLCALC", "TRIG",  "CHOLHDL", "LDLDIRECT" in the last 72 hours. Thyroid function studies No results for input(s): "TSH", "T4TOTAL", "T3FREE", "THYROIDAB" in the last 72 hours.  Invalid input(s): "FREET3" Anemia work up No results for input(s): "VITAMINB12", "FOLATE", "FERRITIN", "TIBC", "IRON", "RETICCTPCT" in the last 72 hours. Urinalysis    Component Value Date/Time   COLORURINE YELLOW 08/16/2023 2003   APPEARANCEUR CLEAR 08/16/2023 2003   LABSPEC 1.015 08/16/2023 2003   PHURINE 5.0 08/16/2023 2003   GLUCOSEU NEGATIVE 08/16/2023 2003   HGBUR SMALL (A) 08/16/2023 2003   BILIRUBINUR NEGATIVE 08/16/2023 2003   KETONESUR NEGATIVE 08/16/2023 2003   PROTEINUR NEGATIVE 08/16/2023 2003   UROBILINOGEN 1.0 04/15/2013 2256   NITRITE  NEGATIVE 08/16/2023 2003   LEUKOCYTESUR NEGATIVE 08/16/2023 2003   Sepsis Labs Recent Labs  Lab 08/17/23 0037 08/18/23 0741 08/19/23 0811 08/20/23 0500  WBC 7.8 5.6 5.7 5.7   Microbiology Recent Results (from the past 240 hours)  Blood Culture (routine x 2)     Status: None (Preliminary result)   Collection Time: 08/16/23  5:57 PM   Specimen: BLOOD LEFT HAND  Result Value Ref Range Status   Specimen Description   Final    BLOOD LEFT HAND Performed at [facility], [address] [place], [place] [address]    Special Requests   Final    BOTTLES DRAWN AEROBIC AND ANAEROBIC Blood Culture adequate volume Performed at [facility], [address] [place], [place] [address]    Culture   Final    NO GROWTH 4 DAYS Performed at [facility], [address], [place], [place] [address]    Report Status PENDING  Incomplete  Resp panel by RT-PCR (RSV, Flu A&B, Covid) Anterior Nasal Swab     Status: None   Collection Time: 08/16/23  6:27 PM   Specimen: Anterior Nasal Swab  Result Value Ref Range Status   SARS Coronavirus 2 by RT PCR NEGATIVE NEGATIVE Final    Comment: (NOTE) SARS-CoV-2 target nucleic acids are NOT DETECTED.  The  SARS-CoV-2 RNA is generally detectable in upper respiratory specimens during the acute phase of infection. The lowest concentration of SARS-CoV-2 viral copies this assay can detect is 138 copies/mL. A negative result does not preclude SARS-Cov-2 infection and should not be used as the sole basis for treatment or other patient management decisions. A negative result may occur with  improper specimen collection/handling, submission of specimen other than nasopharyngeal swab, presence of viral mutation(s) within the areas targeted by this assay, and inadequate number of viral copies(<138 copies/mL). A negative result must be combined with clinical observations, patient history, and epidemiological information. The expected result is Negative.  Fact Sheet for Patients:  BloggerCourse.com  Fact Sheet for Healthcare Providers:  SeriousBroker.it  This test is no t yet approved or cleared by the [place] FDA and  has been authorized for detection and/or diagnosis of SARS-CoV-2 by FDA under an Emergency Use Authorization (EUA). This EUA will remain  in effect (meaning this test can be used) for the duration of the COVID-19 declaration under Section 564(b)(1) of the Act, 21 U.S.C.section 360bbb-3(b)(1), unless the authorization is terminated  or revoked sooner.       Influenza A by PCR NEGATIVE NEGATIVE Final   Influenza B by PCR NEGATIVE NEGATIVE Final    Comment: (NOTE) The Xpert Xpress SARS-CoV-2/FLU/RSV plus assay is intended as an aid in the diagnosis of influenza from Nasopharyngeal swab specimens and should not be used as a sole basis for treatment. Nasal washings and aspirates are unacceptable for Xpert Xpress SARS-CoV-2/FLU/RSV testing.  Fact Sheet for Patients: BloggerCourse.com  Fact Sheet for Healthcare Providers: SeriousBroker.it  This test is not yet approved or  cleared by the [place] FDA and has been authorized for detection and/or diagnosis of SARS-CoV-2 by FDA under an Emergency Use Authorization (EUA). This EUA will remain in effect (meaning this test can be used) for the duration of the COVID-19 declaration under Section 564(b)(1) of the Act, 21 U.S.C. section 360bbb-3(b)(1), unless the authorization is terminated or revoked.     Resp Syncytial Virus by PCR NEGATIVE NEGATIVE Final    Comment: (NOTE) Fact Sheet for Patients: BloggerCourse.com  Fact Sheet for Healthcare Providers: SeriousBroker.it  This test is not yet approved or cleared by the [place] FDA and has been authorized for detection and/or diagnosis of SARS-CoV-2 by FDA under an Emergency Use Authorization (EUA). This EUA will remain in effect (meaning this test can be used) for the duration of the COVID-19 declaration under Section 564(b)(1) of the Act, 21 U.S.C. section 360bbb-3(b)(1), unless the authorization is terminated or revoked.  Performed at [facility], [address], [place], [place] [address]   MRSA Next Gen by PCR, Nasal     Status: None   Collection Time: 08/16/23 10:30 PM   Specimen: Nasal Mucosa; Nasal Swab  Result Value Ref Range Status   MRSA by PCR Next Gen NOT DETECTED NOT DETECTED Final    Comment: (NOTE) The GeneXpert MRSA Assay (FDA approved for NASAL specimens only), is one component of a comprehensive MRSA colonization surveillance program. It is not intended to diagnose MRSA infection nor to guide or monitor treatment for MRSA infections. Test performance is not FDA approved in patients less than 64 years old. Performed at [facility], [address], [place], [place] [address]   Blood Culture (routine x 2)     Status: None (Preliminary result)   Collection Time: 08/17/23 12:37 AM   Specimen: BLOOD RIGHT ARM  Result Value Ref Range Status    Specimen Description   Final    BLOOD RIGHT ARM Performed at [facility], [address], [place], [place] [address]    Special Requests   Final    BOTTLES DRAWN AEROBIC ONLY Blood Culture adequate volume Performed at [facility], [address] [place], [place] [address]    Culture   Final    NO GROWTH 3 DAYS Performed at [facility], [address] [place], [place] [address]    Report Status PENDING  Incomplete     Time coordinating discharge: 39 min SIGNED:   Alwyn Ren, MD  [facility] 08/20/2023, 3:43 PM

## 2023-08-20 NOTE — Progress Notes (Signed)
 Rounding Note    Patient Name: Daniel Whitaker Date of Encounter: 08/20/2023  [facility] Cardiologist: [name], MD   Subjective   Sitting in chair  Eager to go home  No CP  Breathing OK at rest   Inpatient Medications    Scheduled Meds:  acyclovir  400 mg Oral Daily   apixaban  5 mg Oral BID   brimonidine  1 drop Both Eyes TID   calcium-vitamin D  1 tablet Oral BID   carvedilol  3.125 mg Oral BID   Chlorhexidine Gluconate Cloth  6 each Topical Nightly   dexamethasone  4 mg Oral Once per day on Sunday Tuesday Thursday   And   dexamethasone  2 mg Oral Once per day on Monday Wednesday   docusate sodium  100 mg Oral BID   dorzolamide-timolol  1 drop Both Eyes BID   furosemide  40 mg Intravenous Daily   latanoprost  1 drop Both Eyes QHS   levalbuterol  0.63 mg Nebulization TID   morphine  15 mg Oral QHS   multivitamin with minerals  1 tablet Oral Q breakfast   pantoprazole  40 mg Oral QAC breakfast   polyethylene glycol  17 g Oral BID   pregabalin  100 mg Oral Daily   pregabalin  200 mg Oral QHS   rosuvastatin  40 mg Oral Daily   senna-docusate  2 tablet Oral BID   sodium chloride flush  3 mL Intravenous Q12H   sodium chloride flush  3 mL Intravenous Q12H   triamcinolone  2 spray Nasal Daily   Continuous Infusions:  ceFEPime (MAXIPIME) IV 2 g (08/20/23 1322)   PRN Meds: acetaminophen **OR** acetaminophen, alum & mag hydroxide-simeth, levalbuterol, Muscle Rub, mouth rinse, oxyCODONE, oxyCODONE, prochlorperazine, sodium chloride flush   Vital Signs    Vitals:   08/20/23 0500 08/20/23 0737 08/20/23 0920 08/20/23 1205  BP:  127/64  120/67  Pulse:  66  64  Resp:  20  20  Temp:  97.7 F (36.5 C)  97.6 F (36.4 C)  TempSrc:  Oral  Oral  SpO2:  95% 96% 97%  Weight: 99.4 kg     Height:        Intake/Output Summary (Last 24 hours) at 08/20/2023 1413 Last data filed at 08/20/2023 0913 Gross per 24 hour  Intake 920 ml  Output 950 ml  Net -30 ml       08/20/2023    5:00 AM 08/17/2023    3:15 AM 08/16/2023    5:38 PM  Last 3 Weights  Weight (lbs) 219 lb 2.2 oz 218 lb 4.1 oz 222 lb 10.6 oz  Weight (kg) 99.4 kg 99 kg 101 kg      Telemetry    Sinus rhythm - Personally Reviewed  ECG    N/a - Personally Reviewed  Physical Exam   VS:  BP 120/67 (BP Location: Left Arm)   Pulse 64   Temp 97.6 F (36.4 C) (Oral)   Resp 20   Ht 6' (1.829 m)   Wt 99.4 kg   SpO2 97%   BMI 29.72 kg/m  , BMI Body mass index is 29.72 kg/m. GENERAL:  Well appearing.  No acute distress  Examined in chair   NECK:  No JVD LUNGS:  Clear to auscultation HEART:  RRR.no murmurs  N oS3    ABD:  Soft  No RUQ tenderness  EXT: Tr LE edema  Labs    High Sensitivity Troponin:  Recent Labs  Lab 07/25/23 1133 08/16/23 1757 08/16/23 2042 08/17/23 0037 08/17/23 0303  TROPONINIHS 5 11 25* 41* 41*     Chemistry Recent Labs  Lab 08/16/23 1757 08/17/23 0037 08/18/23 0741 08/18/23 1429 08/19/23 0811 08/20/23 0500  NA 138   < > 138  --  137 140  K 3.9   < > 3.4*  --  3.2* 3.6  CL 103   < > 104  --  103 106  CO2 21*   < > 24  --  25 24  GLUCOSE 178*   < > 97  --  87 83  BUN 38*   < > 27*  --  29* 30*  CREATININE 0.92   < > 0.75  --  0.57* 0.79  CALCIUM 9.4   < > 9.4  --  9.7 10.0  MG 1.9  --   --  2.0  --   --   PROT 8.0   < > 7.2  --  7.6 7.6  ALBUMIN 2.9*   < > 2.6*  --  2.8* 2.7*  AST 23   < > 12*  --  11* 12*  ALT 18   < > 14  --  15 14  ALKPHOS 93   < > 77  --  76 72  BILITOT 0.8   < > 0.5  --  0.6 0.5  GFRNONAA >60   < > >60  --  >60 >60  ANIONGAP 14   < > 10  --  9 10   < > = values in this interval not displayed.    Lipids No results for input(s): "CHOL", "TRIG", "HDL", "LABVLDL", "LDLCALC", "CHOLHDL" in the last 168 hours.  Hematology Recent Labs  Lab 08/18/23 0741 08/19/23 0811 08/20/23 0500  WBC 5.6 5.7 5.7  RBC 2.95* 3.31* 3.18*  HGB 8.2* 9.1* 8.7*  HCT 26.9* 30.8* 28.6*  MCV 91.2 93.1 89.9  MCH 27.8 27.5 27.4   MCHC 30.5 29.5* 30.4  RDW 17.3* 17.0* 17.0*  PLT 235 253 264   Thyroid  Recent Labs  Lab 08/17/23 1219  TSH 0.452    BNP Recent Labs  Lab 08/17/23 1219  BNP 265.3*    DDimer No results for input(s): "DDIMER" in the last 168 hours.   Radiology    DG Shoulder Left Result Date: 08/19/2023 CLINICAL DATA:  Pain.  No history of trauma reported EXAM: LEFT SHOULDER - 3 VIEW COMPARISON:  None Available. FINDINGS: Joint space loss and sclerosis of the [facility] joint. Preserved glenohumeral joint. No fracture or dislocation of the shoulder. Osteopenia. Multiple left-sided rib deformities. Please correlate with separate x-ray of the chest from 08/16/2023. there is some areas of lucency along the shaft of the humerus. Some of these are subcortical. IMPRESSION: Degenerative changes of the [facility] joint. Osteopenia. Multiple old left rib fractures. Lucent lesions along the proximal left humeral shaft. Some of these are subcortical. Please correlate for any known history such as multiple myeloma. Electronically Signed   By: Karen Kays M.D.   On: 08/19/2023 16:49     Cardiac Studies   Echo 07/2023: MPRESSIONS     1. Limited study for LV function; not all views obtained; MR not well  assessed.   2. Left ventricular ejection fraction, by estimation, is 60 to 65%. The  left ventricle has normal function. The left ventricle has no regional  wall motion abnormalities. Left ventricular diastolic parameters were  normal.   3. Right ventricular  systolic function is normal. The right ventricular  size is normal.   4. The mitral valve is normal in structure. Mild to moderate mitral valve  regurgitation. No evidence of mitral stenosis.   5. The aortic valve is tricuspid. Aortic valve regurgitation is not  visualized. Aortic valve sclerosis is present, with no evidence of aortic  valve stenosis.   6. Aortic dilatation noted. There is mild dilatation of the ascending  aorta, measuring 43 mm.     Patient  Profile     78 y.o. adult M with MM on chemo, hypertension, CAD s/p LAD PCI (2023), OSA and anemia admitted with new onset atrial fibrillation with RVR.  Assessment & Plan    # New onset atrial fibrillation with RVR: He converted to sinus rhythm on amiodarone.  He is not on this now   LVEF 60-65%.  TSH wnl.  Continue Eliquis   Continue carvedilol   REmains in SR    # Elevated troponin: # CAD s/p PCI: Hs-troponin increased mildly.  Prior prox LAD PCI/stent in 2023. He currently has no chest pain.  Likely demand ischemia.  No further evaluation needed at this time.   Off aspirin now that he is on Eliquis. Continue carvedilol.  Continue rosuvastatin.   I have reviewed with interventional service    No antiplt Rx   Only Eliquis     HFpEF:   Lasix 40 mg IV daily started yesterday   Volume status appears overall OK Switch to PO   Keep daily (was on 40/20)     # Hypertension: BP is controlled on current regimen  He is not on spiro as he was as outpt   Follow BP for now         OK to d/c   WIll make sure he has follow up   For questions or updates, please contact [facility] Please consult [email or web address] for contact info under        Signed, Dietrich Pates, MD  08/20/2023, 2:13 PM

## 2023-08-20 NOTE — Care Management Important Message (Signed)
 Important Message  Patient Details IM Letter given Name: Daniel Whitaker MRN: [id number] Date of Birth: 04-04-1946   Important Message Given:  Yes - Medicare IM     Caren Macadam 08/20/2023, 3:08 PM

## 2023-08-20 NOTE — Progress Notes (Signed)
   08/19/23 2306  BiPAP/CPAP/SIPAP  BiPAP/CPAP/SIPAP Pt Type Adult  BiPAP/CPAP/SIPAP Resmed  Mask Type Nasal pillows  Dentures removed? Not applicable  Respiratory Rate 18 breaths/min  Flow Rate 3 lpm  Patient Home Equipment Yes  Safety Check Completed by RT for Home Unit Yes, no issues noted  BiPAP/CPAP /SiPAP Vitals  Temp 98.8 F (37.1 C)  Pulse Rate 66  Resp 18  BP 119/69  SpO2 96 %  MEWS Score/Color  MEWS Score 0  MEWS Score Color Green

## 2023-08-20 NOTE — Progress Notes (Signed)
 Daniel Whitaker   DOB:1945/09/02   ZO#:[id number]    ASSESSMENT & PLAN:  Multiple myeloma without remission (HCC) He has IgA kappa multiple myeloma with multiple bone fractures at presentation in 2024.  Bone marrow biopsy showed 15% involvement, standard risk myeloma FISH and cytogenetics He is not receiving Zometa, pending dental clearance I gave his grandson a letter of clearance to proceed with dental procedure next week He will continue dexamethasone 4 mg every day for now, to be taper next week He will continue vitamin D supplement and calcium He is on acyclovir for antimicrobial prophylaxis Will place his treatment on hold until he is discharged   Fluid retention, low albumin, third spacing He has significant fluid retention due to reintroduction of dexamethasone He has good response with diuretic therapy Continue the same   Cancer associated pain He has recent exacerbation of bone pain due to steroid withdrawal Pain control is improved reintroduction of dexamethasone and MS Contin at night He will continue oxycodone as needed along with acetaminophen   Atrial fibrillation with rapid ventricular response, resolved Continue medical management No bleeding complication from NOAC   Fever, resolved Healthcare associated infection is a possibility Improve on IV antibiotics I recommend the patient to continue on pulmonary rehab   Anemia Improved after diuresis Monitor closely   Discharge planning I am hopeful he can be discharged today    [name], MD 08/20/2023 9:00 AM  Subjective:  He is feeling better today.  Pain is well-controlled.  His grandson by the bedside.  Has good urine output No signs of bleeding  Objective:  Vitals:   08/20/23 0333 08/20/23 0737  BP: 135/63 127/64  Pulse: 69 66  Resp: 20 20  Temp: 98.6 F (37 C) 97.7 F (36.5 C)  SpO2: 98% 95%     Intake/Output Summary (Last 24 hours) at 08/20/2023 0900 Last data filed at 08/20/2023 0200 Gross  per 24 hour  Intake 680 ml  Output 950 ml  Net -270 ml   Limited chest examination is performed Good breath sounds bilaterally, minimal crackles at the left lung base, improved compared to exam yesterday Heart exam: Regular rate and rhythm, no murmurs, mild bilateral lower extremity edema

## 2023-08-20 NOTE — TOC Progression Note (Signed)
 Transition of Care [facility]) - Progression Note   Patient Details  Name: Daniel Whitaker MRN: [id number] Date of Birth: 1946-05-04  [facility] of [facility]) CM/SW Contact  Ewing Schlein, LCSW Phone Number: 08/20/2023, 2:08 PM  Clinical Narrative: PT/OT evaluations recommended HH. CSW spoke with wife and she is agreeable to [facility] and would like [facility] as the patient used the agency before. CSW made [facility] referral to Amy with [facility], which was accepted. HH orders requested. CSW updated patient and wife regarding HH being set up with [facility].  Expected Discharge Plan: Home w Home Health Services Barriers to Discharge: Continued Medical Work up  Expected Discharge Plan and Services In-house Referral: Clinical Social Work Post Acute Care Choice: Home Health Living arrangements for the past 2 months: Single Family Home           DME Arranged: N/A DME Agency: NA HH Arranged: PT, OT HH Agency: [facility] Date HH Agency Contacted: 08/20/23 Time HH Agency Contacted: 1404 Representative spoke with at [facility] Agency: Amy  Social Determinants of Health (SDOH) Interventions SDOH Screenings   Food Insecurity: No Food Insecurity (08/17/2023)  Housing: Low Risk  (08/17/2023)  Transportation Needs: No Transportation Needs (08/17/2023)  Utilities: Not At Risk (08/17/2023)  Recent Concern: Utilities - At Risk (07/23/2023)  Depression (PHQ2-9): Low Risk  (03/12/2022)  Social Connections: Moderately Integrated (08/17/2023)  Tobacco Use: Medium Risk (08/16/2023)   Readmission Risk Interventions    08/17/2023    8:48 AM 05/07/2023   10:37 AM  Readmission Risk Prevention Plan  Transportation Screening Complete Complete  PCP or Specialist Appt within 5-7 Days  Complete  Home Care Screening  Complete  Medication Review (RN CM)  Complete  Medication Review (RN Care Manager) Complete   PCP or Specialist appointment within 3-5 days of discharge Complete   HRI or Home Care Consult Complete   SW Recovery  Care/Counseling Consult Complete   Palliative Care Screening Not Applicable   Skilled Nursing Facility Not Applicable

## 2023-08-21 LAB — CULTURE, BLOOD (ROUTINE X 2)
Culture: NO GROWTH
Special Requests: ADEQUATE

## 2023-08-22 LAB — CULTURE, BLOOD (ROUTINE X 2)
Culture: NO GROWTH
Special Requests: ADEQUATE

## 2023-08-24 ENCOUNTER — Other Ambulatory Visit: Payer: Self-pay | Admitting: Hematology and Oncology

## 2023-08-24 MED ORDER — OXYCODONE HCL 10 MG PO TABS: 10.0000 mg | ORAL_TABLET | ORAL | 0 refills | Status: DC

## 2023-08-25 ENCOUNTER — Inpatient Hospital Stay (HOSPITAL_BASED_OUTPATIENT_CLINIC_OR_DEPARTMENT_OTHER): Admitting: Hematology and Oncology

## 2023-08-25 ENCOUNTER — Encounter: Payer: Self-pay | Admitting: Hematology and Oncology

## 2023-08-25 ENCOUNTER — Other Ambulatory Visit: Payer: Self-pay | Admitting: Hematology and Oncology

## 2023-08-25 ENCOUNTER — Inpatient Hospital Stay

## 2023-08-25 ENCOUNTER — Other Ambulatory Visit (HOSPITAL_COMMUNITY): Payer: Self-pay

## 2023-08-25 VITALS — BP 128/51 | HR 71 | Temp 97.9°F | Resp 17 | Wt 215.6 lb

## 2023-08-25 DIAGNOSIS — D638 Anemia in other chronic diseases classified elsewhere: Secondary | ICD-10-CM | POA: Diagnosis not present

## 2023-08-25 DIAGNOSIS — C9000 Multiple myeloma not having achieved remission: Secondary | ICD-10-CM

## 2023-08-25 DIAGNOSIS — E8770 Fluid overload, unspecified: Secondary | ICD-10-CM

## 2023-08-25 DIAGNOSIS — I480 Paroxysmal atrial fibrillation: Secondary | ICD-10-CM | POA: Diagnosis not present

## 2023-08-25 DIAGNOSIS — Z79899 Other long term (current) drug therapy: Secondary | ICD-10-CM | POA: Diagnosis not present

## 2023-08-25 DIAGNOSIS — Z5112 Encounter for antineoplastic immunotherapy: Secondary | ICD-10-CM | POA: Diagnosis not present

## 2023-08-25 DIAGNOSIS — G893 Neoplasm related pain (acute) (chronic): Secondary | ICD-10-CM | POA: Diagnosis not present

## 2023-08-25 LAB — COMPREHENSIVE METABOLIC PANEL
ALT: 8 U/L (ref 0–44)
AST: 11 U/L — ABNORMAL LOW (ref 15–41)
Albumin: 3.2 g/dL — ABNORMAL LOW (ref 3.5–5.0)
Alkaline Phosphatase: 81 U/L (ref 38–126)
Anion gap: 7 (ref 5–15)
BUN: 39 mg/dL — ABNORMAL HIGH (ref 8–23)
CO2: 31 mmol/L (ref 22–32)
Calcium: 12 mg/dL — ABNORMAL HIGH (ref 8.9–10.3)
Chloride: 103 mmol/L (ref 98–111)
Creatinine, Ser: 1.09 mg/dL (ref 0.61–1.24)
GFR, Estimated: >60 mL/min (ref >60)
Glucose, Bld: 113 mg/dL — ABNORMAL HIGH (ref 70–99)
Potassium: 4.1 mmol/L (ref 3.5–5.1)
Sodium: 141 mmol/L (ref 135–145)
Total Bilirubin: 0.3 mg/dL (ref 0.0–1.2)
Total Protein: 9 g/dL — ABNORMAL HIGH (ref 6.5–8.1)

## 2023-08-25 LAB — CBC WITH DIFFERENTIAL/PLATELET
Abs Immature Granulocytes: 0.07 10*3/uL (ref 0.00–0.07)
Basophils Absolute: 0.1 10*3/uL (ref 0.0–0.1)
Basophils Relative: 2 %
Eosinophils Absolute: 0.1 10*3/uL (ref 0.0–0.5)
Eosinophils Relative: 1 %
HCT: 30.6 % — ABNORMAL LOW (ref 39.0–52.0)
Hemoglobin: 9.3 g/dL — ABNORMAL LOW (ref 13.0–17.0)
Immature Granulocytes: 1 %
Lymphocytes Relative: 14 %
Lymphs Abs: 1.3 10*3/uL (ref 0.7–4.0)
MCH: 27.2 pg (ref 26.0–34.0)
MCHC: 30.4 g/dL (ref 30.0–36.0)
MCV: 89.5 fL (ref 80.0–100.0)
Monocytes Absolute: 0.5 10*3/uL (ref 0.1–1.0)
Monocytes Relative: 5 %
Neutro Abs: 7.6 10*3/uL (ref 1.7–7.7)
Neutrophils Relative %: 77 %
Platelets: 272 10*3/uL (ref 150–400)
RBC: 3.42 MIL/uL — ABNORMAL LOW (ref 4.22–5.81)
RDW: 17.4 % — ABNORMAL HIGH (ref 11.5–15.5)
WBC: 9.6 10*3/uL (ref 4.0–10.5)
nRBC: 0 % (ref 0.0–0.2)

## 2023-08-25 MED ORDER — MORPHINE SULFATE ER 15 MG PO TBCR
15.0000 mg | EXTENDED_RELEASE_TABLET | Freq: Every day | ORAL | 0 refills | Status: DC
  Filled 2023-08-25: qty 30, 30d supply, fill #0

## 2023-08-25 MED ORDER — DEXAMETHASONE 4 MG PO TABS
ORAL_TABLET | ORAL | 1 refills | Status: DC
  Filled 2023-08-25: qty 90, 63d supply, fill #0

## 2023-08-25 MED ORDER — FUROSEMIDE 40 MG PO TABS
ORAL_TABLET | ORAL | 1 refills | Status: DC
  Filled 2023-08-25: qty 90, 60d supply, fill #0

## 2023-08-25 NOTE — Assessment & Plan Note (Addendum)
 He is clinically euvolemic Monitor closely for fluid overload

## 2023-08-25 NOTE — Assessment & Plan Note (Addendum)
 He has IgA kappa multiple myeloma with multiple bone fractures at presentation in 2024.  Bone marrow biopsy showed 15% involvement, standard risk myeloma FISH and cytogenetics  He has good partial response with combination chemotherapy with daratumumab, lenalidomide, bortezomib and dexamethasone His treatment was interrupted in February and again recently due to hospitalizations  Recent myeloma panel test results in February showed worsening M protein, IgA and light chain studies It is possible that his disease got worse due to recent interruption of treatment but I do not expect this degree of abnormalities For now, I plan to continue treatment but I also recommend referral to [facility] for second opinion  He is not receiving Zometa, pending dental clearance Assuming his dental extraction goes well today, he will resume lenalidomide tomorrow  I will see him again in 2 days for further follow-up He will continue aspirin for DVT prophylaxis He will continue vitamin D supplement and calcium He is on acyclovir for antimicrobial prophylaxis

## 2023-08-25 NOTE — Assessment & Plan Note (Addendum)
 This is likely anemia of chronic disease and from recent renal failure and chemotherapy. The patient denies recent history of bleeding such as epistaxis, hematuria or hematochezia. He is asymptomatic from the anemia. We will observe for now.

## 2023-08-25 NOTE — Assessment & Plan Note (Addendum)
 He had recent paroxysmal atrial fibrillation He will resume Eliquis tomorrow assuming he has no excessive bleeding with dental extraction

## 2023-08-25 NOTE — Assessment & Plan Note (Addendum)
 He has recurrent malignant hypercalcemia and again His total protein is high He is slightly dehydrated make his hypercalcemia worse We discussed importance of aggressive oral fluid hydration, with target goal 100 to 120 ounces for the next few days Plan to increase his furosemide to 40 mg twice daily He will increase dexamethasone to 8 mg today and tomorrow and 60 mg on Friday He will resume calcitonin I plan to recheck his calcium level again on Friday He is not ready to start Zometa due to dental extraction today

## 2023-08-25 NOTE — Telephone Encounter (Signed)
Pls refill electronically °

## 2023-08-25 NOTE — Progress Notes (Signed)
 [facility] OFFICE PROGRESS NOTE  Patient Care Team: [name], MD as PCP - General (Family Medicine) [name], MD as PCP - Cardiology (Cardiology)  Assessment & Plan Multiple myeloma not having achieved remission [facility]) He has IgA kappa multiple myeloma with multiple bone fractures at presentation in 2024.  Bone marrow biopsy showed 15% involvement, standard risk myeloma FISH and cytogenetics  He has good partial response with combination chemotherapy with daratumumab, lenalidomide, bortezomib and dexamethasone His treatment was interrupted in February and again recently due to hospitalizations  Recent myeloma panel test results in February showed worsening M protein, IgA and light chain studies It is possible that his disease got worse due to recent interruption of treatment but I do not expect this degree of abnormalities For now, I plan to continue treatment but I also recommend referral to [facility] for second opinion  He is not receiving Zometa, pending dental clearance Assuming his dental extraction goes well today, he will resume lenalidomide tomorrow  I will see him again in 2 days for further follow-up He will continue aspirin for DVT prophylaxis He will continue vitamin D supplement and calcium He is on acyclovir for antimicrobial prophylaxis Hypercalcemia He has recurrent malignant hypercalcemia and again His total protein is high He is slightly dehydrated make his hypercalcemia worse We discussed importance of aggressive oral fluid hydration, with target goal 100 to 120 ounces for the next few days Plan to increase his furosemide to 40 mg twice daily He will increase dexamethasone to 8 mg today and tomorrow and 60 mg on Friday He will resume calcitonin I plan to recheck his calcium level again on Friday He is not ready to start Zometa due to dental extraction today Hypervolemia, unspecified hypervolemia type He is  clinically euvolemic Monitor closely for fluid overload Anemia of chronic disease This is likely anemia of chronic disease and from recent renal failure and chemotherapy. The patient denies recent history of bleeding such as epistaxis, hematuria or hematochezia. He is asymptomatic from the anemia. We will observe for now.  Paroxysmal atrial fibrillation (HCC) He had recent paroxysmal atrial fibrillation He will resume Eliquis tomorrow assuming he has no excessive bleeding with dental extraction  No orders of the defined types were placed in this encounter.    [name], MD  INTERVAL HISTORY: he returns for surveillance followup after recent hospital discharge He has left upper molar on the left side extracted yesterday He is scheduled for right upper molar extraction today There were no excessive bleeding He is breathing better Denies coughing He is completing his course of antibiotics His pain is reasonably controlled We reviewed test results; unfortunately, he has recurrent hypercalcemia  PHYSICAL EXAMINATION: ECOG PERFORMANCE STATUS: 1 - Symptomatic but completely ambulatory  Vitals:   08/25/23 0932  BP: (!) 128/51  Pulse: 71  Resp: 17  Temp: 97.9 F (36.6 C)  SpO2: 94%   Filed Weights   08/25/23 0932  Weight: 215 lb 9.6 oz (97.8 kg)    Relevant data reviewed during this visit included CBC and CMP

## 2023-08-27 ENCOUNTER — Inpatient Hospital Stay

## 2023-08-27 ENCOUNTER — Inpatient Hospital Stay: Admitting: Hematology and Oncology

## 2023-08-27 ENCOUNTER — Encounter: Payer: Self-pay | Admitting: Hematology and Oncology

## 2023-08-27 ENCOUNTER — Other Ambulatory Visit: Payer: Self-pay | Admitting: Hematology and Oncology

## 2023-08-27 VITALS — BP 139/60 | HR 79 | Resp 18 | Ht 72.0 in | Wt 213.4 lb

## 2023-08-27 VITALS — BP 108/76 | HR 67 | Temp 97.8°F | Resp 18

## 2023-08-27 DIAGNOSIS — C9000 Multiple myeloma not having achieved remission: Secondary | ICD-10-CM

## 2023-08-27 DIAGNOSIS — J9811 Atelectasis: Secondary | ICD-10-CM | POA: Diagnosis not present

## 2023-08-27 DIAGNOSIS — G893 Neoplasm related pain (acute) (chronic): Secondary | ICD-10-CM | POA: Diagnosis not present

## 2023-08-27 DIAGNOSIS — R079 Chest pain, unspecified: Secondary | ICD-10-CM | POA: Diagnosis not present

## 2023-08-27 DIAGNOSIS — R410 Disorientation, unspecified: Secondary | ICD-10-CM | POA: Diagnosis not present

## 2023-08-27 DIAGNOSIS — I6523 Occlusion and stenosis of bilateral carotid arteries: Secondary | ICD-10-CM | POA: Diagnosis not present

## 2023-08-27 DIAGNOSIS — S2243XA Multiple fractures of ribs, bilateral, initial encounter for closed fracture: Secondary | ICD-10-CM | POA: Diagnosis not present

## 2023-08-27 DIAGNOSIS — D539 Nutritional anemia, unspecified: Secondary | ICD-10-CM

## 2023-08-27 DIAGNOSIS — I4891 Unspecified atrial fibrillation: Secondary | ICD-10-CM | POA: Diagnosis not present

## 2023-08-27 DIAGNOSIS — R4182 Altered mental status, unspecified: Secondary | ICD-10-CM | POA: Diagnosis not present

## 2023-08-27 DIAGNOSIS — R918 Other nonspecific abnormal finding of lung field: Secondary | ICD-10-CM | POA: Diagnosis not present

## 2023-08-27 LAB — CBC WITH DIFFERENTIAL/PLATELET
Abs Immature Granulocytes: 0.06 10*3/uL (ref 0.00–0.07)
Basophils Absolute: 0.2 10*3/uL — ABNORMAL HIGH (ref 0.0–0.1)
Basophils Relative: 1 %
Eosinophils Absolute: 0.1 10*3/uL (ref 0.0–0.5)
Eosinophils Relative: 1 %
HCT: 30.4 % — ABNORMAL LOW (ref 39.0–52.0)
Hemoglobin: 9.7 g/dL — ABNORMAL LOW (ref 13.0–17.0)
Immature Granulocytes: 1 %
Lymphocytes Relative: 11 %
Lymphs Abs: 1.4 10*3/uL (ref 0.7–4.0)
MCH: 27.2 pg (ref 26.0–34.0)
MCHC: 31.9 g/dL (ref 30.0–36.0)
MCV: 85.2 fL (ref 80.0–100.0)
Monocytes Absolute: 0.6 10*3/uL (ref 0.1–1.0)
Monocytes Relative: 5 %
Neutro Abs: 10.8 10*3/uL — ABNORMAL HIGH (ref 1.7–7.7)
Neutrophils Relative %: 81 %
Platelets: 311 10*3/uL (ref 150–400)
RBC: 3.57 MIL/uL — ABNORMAL LOW (ref 4.22–5.81)
RDW: 17.2 % — ABNORMAL HIGH (ref 11.5–15.5)
WBC: 13.1 10*3/uL — ABNORMAL HIGH (ref 4.0–10.5)
nRBC: 0.2 % (ref 0.0–0.2)

## 2023-08-27 LAB — COMPREHENSIVE METABOLIC PANEL WITH GFR
ALT: 9 U/L (ref 0–44)
AST: 12 U/L — ABNORMAL LOW (ref 15–41)
Albumin: 3.3 g/dL — ABNORMAL LOW (ref 3.5–5.0)
Alkaline Phosphatase: 78 U/L (ref 38–126)
Anion gap: 9 (ref 5–15)
BUN: 42 mg/dL — ABNORMAL HIGH (ref 8–23)
CO2: 31 mmol/L (ref 22–32)
Calcium: 12.9 mg/dL — ABNORMAL HIGH (ref 8.9–10.3)
Chloride: 96 mmol/L — ABNORMAL LOW (ref 98–111)
Creatinine, Ser: 1.25 mg/dL — ABNORMAL HIGH (ref 0.61–1.24)
GFR, Estimated: 59 mL/min — ABNORMAL LOW (ref >60)
Glucose, Bld: 109 mg/dL — ABNORMAL HIGH (ref 70–99)
Potassium: 3.3 mmol/L — ABNORMAL LOW (ref 3.5–5.1)
Sodium: 136 mmol/L (ref 135–145)
Total Bilirubin: 0.4 mg/dL (ref 0.0–1.2)
Total Protein: 9.8 g/dL — ABNORMAL HIGH (ref 6.5–8.1)

## 2023-08-27 MED ORDER — SODIUM CHLORIDE 0.9 % IV SOLN: INTRAVENOUS | Status: DC

## 2023-08-27 MED ORDER — ACETAMINOPHEN 325 MG PO TABS
650.0000 mg | ORAL_TABLET | Freq: Once | ORAL | Status: AC
Start: 2023-08-27 — End: 2023-08-27
  Administered 2023-08-27: 650 mg via ORAL
  Filled 2023-08-27: qty 2

## 2023-08-27 MED ORDER — CALCITONIN (SALMON) 200 UNIT/ML IJ SOLN
4.0000 [IU]/kg | Freq: Once | INTRAMUSCULAR | Status: AC
  Administered 2023-08-27: 400 [IU] via SUBCUTANEOUS
  Filled 2023-08-27: qty 2

## 2023-08-27 MED ORDER — DARATUMUMAB-HYALURONIDASE-FIHJ 1800-30000 MG-UT/15ML ~~LOC~~ SOLN
1800.0000 mg | Freq: Once | SUBCUTANEOUS | Status: AC
  Administered 2023-08-27: 1800 mg via SUBCUTANEOUS
  Filled 2023-08-27: qty 15

## 2023-08-27 MED ORDER — DIPHENHYDRAMINE HCL 25 MG PO CAPS
25.0000 mg | ORAL_CAPSULE | Freq: Once | ORAL | Status: AC
  Administered 2023-08-27: 25 mg via ORAL
  Filled 2023-08-27: qty 1

## 2023-08-27 MED ORDER — BORTEZOMIB CHEMO SQ INJECTION 3.5 MG (2.5MG/ML)
1.0400 mg/m2 | Freq: Once | INTRAMUSCULAR | Status: AC
  Administered 2023-08-27: 2.25 mg via SUBCUTANEOUS
  Filled 2023-08-27: qty 0.9

## 2023-08-27 MED ORDER — CALCITONIN (SALMON) 200 UNIT/ML IJ SOLN
4.0000 [IU]/kg | Freq: Once | INTRAMUSCULAR | Status: DC
  Filled 2023-08-27: qty 1.94

## 2023-08-27 NOTE — Patient Instructions (Addendum)
 Rehydration, Older Adult  Rehydration is the replacement of fluids, salts, and minerals in the body (electrolytes) that are lost during dehydration. Dehydration is when there is not enough water or other fluids in the body. This happens when you lose more fluids than you take in. People who are age 78 or older have a higher risk of dehydration than younger adults. This is because in older age, the body: Is less able to maintain the right amount of water. Does not respond to temperature changes as well. Does not get a sense of thirst as easily or quickly. Other causes include: Not drinking enough fluids. This can occur when you are ill, when you forget to drink, or when you are doing activities that require a lot of energy, especially in hot weather. Conditions that cause loss of water or other fluids. These include diarrhea, vomiting, sweating, or urinating a lot. Other illnesses, such as fever or infection. Certain medicines, such as those that remove excess fluid from the body (diuretics). Symptoms of mild or moderate dehydration may include thirst, dry lips and mouth, and dizziness. Symptoms of severe dehydration may include increased heart rate, confusion, fainting, and not urinating. In severe cases, you may need to get fluids through an IV at the hospital. For mild or moderate cases, you can usually rehydrate at home by drinking certain fluids as told by your health care provider. What are the risks? Rehydration is usually safe. Taking in too much fluid (overhydration) can be a problem but is rare. Overhydration can cause an imbalance of electrolytes in the body, kidney failure, fluid in the lungs, or a decrease in salt (sodium) levels in the body. Supplies needed: You will need an oral rehydration solution (ORS) if your health care provider tells you to use one. This is a drink to treat dehydration. It can be found in pharmacies and retail stores. How to rehydrate Fluids Follow  instructions from your health care provider about what to drink. The kind of fluid and the amount you should drink depend on your condition. In general, you should choose drinks that you prefer. If told by your health care provider, drink an ORS. Make an ORS by following instructions on the package. Start by drinking small amounts, about  cup (120 mL) every 5-10 minutes. Slowly increase how much you drink until you have taken in the amount recommended by your health care provider. Drink enough clear fluids to keep your urine pale yellow. If you were told to drink an ORS, finish it first, then start slowly drinking other clear fluids. Drink fluids such as: Water. This includes sparkling and flavored water. Drinking only water can lead to having too little sodium in your body (hyponatremia). Follow the advice of your health care provider. Water from ice chips you suck on. Fruit juice with water added to it(diluted). Sports drinks. Hot or cold herbal teas. Broth-based soups. Coffee. Milk or milk products. Food Follow instructions from your health care provider about what to eat while you rehydrate. Your health care provider may recommend that you slowly begin eating regular foods in small amounts. Eat foods that contain a healthy balance of electrolytes, such as bananas, oranges, potatoes, tomatoes, and spinach. Avoid foods that are greasy or contain a lot of sugar. In some cases, you may get nutrition through a feeding tube that is passed through your nose and into your stomach (nasogastric tube, or NG tube). This may be done if you have uncontrolled vomiting or diarrhea. Drinks to avoid  Certain drinks may make dehydration worse. While you rehydrate, avoid drinking alcohol. How to tell if you are recovering from dehydration You may be getting better if: You are urinating more often than before you started rehydrating. Your urine is pale yellow. Your energy level improves. You vomit less  often. You have diarrhea less often. Your appetite improves or returns to normal. You feel less dizzy or light-headed. Your skin tone and color start to look more normal. Follow these instructions at home: Take over-the-counter and prescription medicines only as told by your health care provider. Do not take sodium tablets. Doing this can lead to having too much sodium in your body (hypernatremia). Contact a health care provider if: You continue to have symptoms of mild or moderate dehydration, such as: Thirst. Dry lips. Slightly dry mouth. Dizziness. Dark urine or less urine than usual. Muscle cramps. You continue to vomit or have diarrhea. Get help right away if: You have symptoms of dehydration that get worse. You have a fever. You have a severe headache. You have been vomiting and have problems, such as: Your vomiting gets worse. Your vomit includes blood or green matter (bile). You cannot eat or drink without vomiting. You have problems with urination or bowel movements, such as: Diarrhea that gets worse. Blood in your stool (feces). This may cause stool to look black and tarry. Not urinating, or urinating only a small amount of very dark urine, within 6-8 hours. You have trouble breathing. You have symptoms that get worse with treatment. These symptoms may be an emergency. Get help right away. Call 911. Do not wait to see if the symptoms will go away. Do not drive yourself to the hospital. This information is not intended to replace advice given to you by your health care provider. Make sure you discuss any questions you have with your health care provider. Document Revised: 10/01/2021 Document Reviewed: 09/29/2021 Elsevier Patient Education  2024 Elsevier Inc.Calcitonin Injection What is this medication? CALCITONIN (kal si TOE nin) treats osteoporosis. It may also be used to treat Paget's disease of the bone. It works by Interior and spatial designer stronger and less likely to  break (fracture). It can also be used to treat high calcium levels in your body. It works by blocking the breakdown of bone calcium and helping your kidneys remove calcium from your blood through the urine. This medicine may be used for other purposes; ask your health care provider or pharmacist if you have questions. COMMON BRAND NAME(S): Miacalcin What should I tell my care team before I take this medication? They need to know if you have any of these conditions: Bone cancer Low level of blood calcium An unusual or allergic reaction to calcitonin, fish, other medications, foods, dyes, or preservatives Pregnant or trying to get pregnant Breast-feeding How should I use this medication? This medication is injected under the skin or into a muscle. You will be taught how to prepare and give it. Take it as directed on the prescription label. Keep taking it unless your care team tells you to stop. It is important that you put your used needles and syringes in a special sharps container. Do not put them in a trash can. If you do not have a sharps container, call your pharmacist or care team to get one. Talk to your care team about the use of this medication in children. Special care may be needed. People over 43 years old may have a stronger reaction and need a smaller dose. Overdosage:  If you think you have taken too much of this medicine contact a poison control center or emergency room at once. NOTE: This medicine is only for you. Do not share this medicine with others. What if I miss a dose? If you miss a dose, use it as soon as you can. If it is almost time for your next dose, use only that dose. Do not use double or extra doses. What may interact with this medication? This medication may interact with the following: Lithium This list may not describe all possible interactions. Give your health care provider a list of all the medicines, herbs, non-prescription drugs, or dietary supplements you  use. Also tell them if you smoke, drink alcohol, or use illegal drugs. Some items may interact with your medicine. What should I watch for while using this medication? Visit your care team for regular checks on your progress. You may need bloodwork while taking this medication. Talk to your care team about your risk of cancer. You may be more at risk for certain types of cancers if you take this medication. You may need to be on a special diet while you are taking this medication. Ask your care team. Ask if you need to take extra calcium or vitamin D while taking this medication. What side effects may I notice from receiving this medication? Side effects that you should report to your care team as soon as possible: Allergic reactions--skin rash, itching, hives, swelling of the face, lips, tongue, or throat Low calcium level--muscle pain or cramps, confusion, tingling, or numbness in the hands or feet Side effects that usually do not require medical attention (report to your care team if they continue or are bothersome): Flushing Loss of appetite Nausea Stomach pain This list may not describe all possible side effects. Call your doctor for medical advice about side effects. You may report side effects to FDA at 1-800-FDA-1088. Where should I keep my medication? Keep out of the reach of children and pets. Store in a refrigerator. Do not freeze. Get rid of any unused medication after the expiration date. To get rid of medications that are no longer needed or have expired: Take the medication to a medication take-back program. Check with your pharmacy or law enforcement to find a location. If you cannot return the medication, ask your pharmacist or care team how to get rid of this medication safely. NOTE: This sheet is a summary. It may not cover all possible information. If you have questions about this medicine, talk to your doctor, pharmacist, or health care provider.  2024 Elsevier/Gold  Standard (2021-07-03 00:00:00)

## 2023-08-27 NOTE — Assessment & Plan Note (Addendum)
 He has IgA kappa multiple myeloma with multiple bone fractures at presentation in 2024.  Bone marrow biopsy showed 15% involvement, standard risk myeloma FISH and cytogenetics  He has good partial response with combination chemotherapy with daratumumab, lenalidomide, bortezomib and dexamethasone His treatment was interrupted in February and again recently due to hospitalizations  Recent myeloma panel test results in February showed worsening M protein, IgA and light chain studies It is possible that his disease got worse due to recent interruption of treatment but I do not expect this degree of abnormalities For now, I plan to continue treatment but I also recommend referral to [facility] for second opinion  He has completed dental extraction but due to gum healing, he is not ready to start bisphosphonates until next week He will continue lenalidomide and restart his chemotherapy today He will continue aspirin for DVT prophylaxis He will continue vitamin D supplement He is on acyclovir for antimicrobial prophylaxis

## 2023-08-27 NOTE — Progress Notes (Signed)
 [facility] OFFICE PROGRESS NOTE  Patient Care Team: [name], MD as PCP - General (Family Medicine) [name], MD as PCP - Cardiology (Cardiology)  Assessment & Plan Multiple myeloma not having achieved remission [facility]) He has IgA kappa multiple myeloma with multiple bone fractures at presentation in 2024.  Bone marrow biopsy showed 15% involvement, standard risk myeloma FISH and cytogenetics  He has good partial response with combination chemotherapy with daratumumab, lenalidomide, bortezomib and dexamethasone His treatment was interrupted in February and again recently due to hospitalizations  Recent myeloma panel test results in February showed worsening M protein, IgA and light chain studies It is possible that his disease got worse due to recent interruption of treatment but I do not expect this degree of abnormalities For now, I plan to continue treatment but I also recommend referral to [facility] for second opinion  He has completed dental extraction but due to gum healing, he is not ready to start bisphosphonates until next week He will continue lenalidomide and restart his chemotherapy today He will continue aspirin for DVT prophylaxis He will continue vitamin D supplement He is on acyclovir for antimicrobial prophylaxis Deficiency anemia Multifactorial This is stable Monitor closely Hypercalcemia He has recurrent malignant hypercalcemia again His total protein is high He is slightly dehydrated make his hypercalcemia worse We discussed importance of aggressive oral fluid hydration, with target goal 100 to 120 ounces for the next few days We will add IV fluids today, tomorrow and Monday Due to slight dehydration, he will take furosemide 40 mg in the morning and 20 mg in the afternoon He will increase dexamethasone to 8 mg daily He will resume calcitonin to be given subcutaneous today I will see him again next week for  further follow-up Cancer related pain He has recent exacerbation of bone pain due to steroid withdrawal Pain control is improved reintroduction of dexamethasone and MS Contin at night He will continue oxycodone as needed along with acetaminophen I will reassess pain control in his next visit  Orders Placed This Encounter  Procedures   CBC with Differential (Cancer Center Only)    Standing Status:   Future    Expiration Date:   08/26/2024   CMP (Cancer Center only)    Standing Status:   Future    Expiration Date:   08/26/2024     [name], MD  INTERVAL HISTORY: he returns for treatment follow-up Complications related to previous cycle of chemotherapy included anemia,, dehydration,, bone aches,, and hypercalcemia We reviewed CBC and CMP results and discussed risk and benefits of additional treatment for hypercalcemia today versus admission to the hospital The patient declined admission to the hospital We will try to give him subcutaneous calcitonin along with additional IV fluids today, tomorrow and Monday in addition to resumption of chemotherapy We discussed medication changes PHYSICAL EXAMINATION: ECOG PERFORMANCE STATUS: 1 - Symptomatic but completely ambulatory  Vitals:   08/27/23 1014  BP: 139/60  Pulse: 79  Resp: 18  SpO2: 93%   Filed Weights   08/27/23 1014  Weight: 213 lb 6.4 oz (96.8 kg)    Relevant data reviewed during this visit included CBC and CMP

## 2023-08-27 NOTE — Assessment & Plan Note (Addendum)
 He has recent exacerbation of bone pain due to steroid withdrawal Pain control is improved reintroduction of dexamethasone and MS Contin at night He will continue oxycodone as needed along with acetaminophen I will reassess pain control in his next visit

## 2023-08-27 NOTE — Assessment & Plan Note (Addendum)
 He has recurrent malignant hypercalcemia again His total protein is high He is slightly dehydrated make his hypercalcemia worse We discussed importance of aggressive oral fluid hydration, with target goal 100 to 120 ounces for the next few days We will add IV fluids today, tomorrow and Monday Due to slight dehydration, he will take furosemide 40 mg in the morning and 20 mg in the afternoon He will increase dexamethasone to 8 mg daily He will resume calcitonin to be given subcutaneous today I will see him again next week for further follow-up

## 2023-08-27 NOTE — Assessment & Plan Note (Addendum)
 Multifactorial This is stable Monitor closely

## 2023-08-28 ENCOUNTER — Inpatient Hospital Stay

## 2023-08-28 DIAGNOSIS — C9000 Multiple myeloma not having achieved remission: Secondary | ICD-10-CM

## 2023-08-28 MED ORDER — SODIUM CHLORIDE 0.9 % IV SOLN: INTRAVENOUS | Status: DC

## 2023-08-29 ENCOUNTER — Emergency Department (HOSPITAL_COMMUNITY)
Admission: EM | Admit: 2023-08-29 | Discharge: 2023-08-29 | Disposition: A | Discharge status: Home / Self Care | Source: Home / Self Care | Attending: Emergency Medicine | Admitting: Emergency Medicine

## 2023-08-29 ENCOUNTER — Other Ambulatory Visit: Payer: Self-pay

## 2023-08-29 ENCOUNTER — Emergency Department (HOSPITAL_COMMUNITY)

## 2023-08-29 ENCOUNTER — Encounter (HOSPITAL_COMMUNITY): Payer: Self-pay

## 2023-08-29 DIAGNOSIS — R079 Chest pain, unspecified: Secondary | ICD-10-CM | POA: Diagnosis not present

## 2023-08-29 DIAGNOSIS — S2243XA Multiple fractures of ribs, bilateral, initial encounter for closed fracture: Secondary | ICD-10-CM | POA: Diagnosis not present

## 2023-08-29 DIAGNOSIS — R600 Localized edema: Secondary | ICD-10-CM | POA: Insufficient documentation

## 2023-08-29 DIAGNOSIS — R918 Other nonspecific abnormal finding of lung field: Secondary | ICD-10-CM | POA: Diagnosis not present

## 2023-08-29 DIAGNOSIS — R410 Disorientation, unspecified: Secondary | ICD-10-CM | POA: Diagnosis not present

## 2023-08-29 DIAGNOSIS — R4182 Altered mental status, unspecified: Secondary | ICD-10-CM | POA: Insufficient documentation

## 2023-08-29 DIAGNOSIS — Z7901 Long term (current) use of anticoagulants: Secondary | ICD-10-CM | POA: Insufficient documentation

## 2023-08-29 DIAGNOSIS — J9811 Atelectasis: Secondary | ICD-10-CM | POA: Diagnosis not present

## 2023-08-29 DIAGNOSIS — I4891 Unspecified atrial fibrillation: Secondary | ICD-10-CM | POA: Diagnosis not present

## 2023-08-29 DIAGNOSIS — I6523 Occlusion and stenosis of bilateral carotid arteries: Secondary | ICD-10-CM | POA: Diagnosis not present

## 2023-08-29 DIAGNOSIS — C9000 Multiple myeloma not having achieved remission: Secondary | ICD-10-CM | POA: Diagnosis not present

## 2023-08-29 LAB — I-STAT CHEM 8, ED
BUN: 40 mg/dL — ABNORMAL HIGH (ref 8–23)
Calcium, Ion: 1.68 mmol/L (ref 1.15–1.40)
Chloride: 101 mmol/L (ref 98–111)
Creatinine, Ser: 1.3 mg/dL — ABNORMAL HIGH (ref 0.61–1.24)
Glucose, Bld: 144 mg/dL — ABNORMAL HIGH (ref 70–99)
HCT: 28 % — ABNORMAL LOW (ref 39.0–52.0)
Hemoglobin: 9.5 g/dL — ABNORMAL LOW (ref 13.0–17.0)
Potassium: 3.2 mmol/L — ABNORMAL LOW (ref 3.5–5.1)
Sodium: 140 mmol/L (ref 135–145)
TCO2: 29 mmol/L (ref 22–32)

## 2023-08-29 LAB — CBC WITH DIFFERENTIAL/PLATELET
Abs Immature Granulocytes: 0.06 10*3/uL (ref 0.00–0.07)
Basophils Absolute: 0.1 10*3/uL (ref 0.0–0.1)
Basophils Relative: 1 %
Eosinophils Absolute: 0.1 10*3/uL (ref 0.0–0.5)
Eosinophils Relative: 1 %
HCT: 29.6 % — ABNORMAL LOW (ref 39.0–52.0)
Hemoglobin: 9.1 g/dL — ABNORMAL LOW (ref 13.0–17.0)
Immature Granulocytes: 1 %
Lymphocytes Relative: 6 %
Lymphs Abs: 0.6 10*3/uL — ABNORMAL LOW (ref 0.7–4.0)
MCH: 27.3 pg (ref 26.0–34.0)
MCHC: 30.7 g/dL (ref 30.0–36.0)
MCV: 88.9 fL (ref 80.0–100.0)
Monocytes Absolute: 0.4 10*3/uL (ref 0.1–1.0)
Monocytes Relative: 5 %
Neutro Abs: 7.9 10*3/uL — ABNORMAL HIGH (ref 1.7–7.7)
Neutrophils Relative %: 86 %
Platelets: 260 10*3/uL (ref 150–400)
RBC: 3.33 MIL/uL — ABNORMAL LOW (ref 4.22–5.81)
RDW: 17.2 % — ABNORMAL HIGH (ref 11.5–15.5)
WBC: 9.2 10*3/uL (ref 4.0–10.5)
nRBC: 0 % (ref 0.0–0.2)

## 2023-08-29 LAB — URINALYSIS, ROUTINE W REFLEX MICROSCOPIC
Bilirubin Urine: NEGATIVE
Glucose, UA: NEGATIVE mg/dL
Ketones, ur: NEGATIVE mg/dL
Nitrite: NEGATIVE
Protein, ur: NEGATIVE mg/dL
Specific Gravity, Urine: 1.006 (ref 1.005–1.030)
pH: 6 (ref 5.0–8.0)

## 2023-08-29 LAB — COMPREHENSIVE METABOLIC PANEL WITH GFR
ALT: 11 U/L (ref 0–44)
AST: 13 U/L — ABNORMAL LOW (ref 15–41)
Albumin: 2.6 g/dL — ABNORMAL LOW (ref 3.5–5.0)
Alkaline Phosphatase: 66 U/L (ref 38–126)
Anion gap: 11 (ref 5–15)
BUN: 47 mg/dL — ABNORMAL HIGH (ref 8–23)
CO2: 27 mmol/L (ref 22–32)
Calcium: 12.7 mg/dL — ABNORMAL HIGH (ref 8.9–10.3)
Chloride: 100 mmol/L (ref 98–111)
Creatinine, Ser: 1.16 mg/dL (ref 0.61–1.24)
GFR, Estimated: >60 mL/min (ref >60)
Glucose, Bld: 139 mg/dL — ABNORMAL HIGH (ref 70–99)
Potassium: 3.1 mmol/L — ABNORMAL LOW (ref 3.5–5.1)
Sodium: 138 mmol/L (ref 135–145)
Total Bilirubin: 0.4 mg/dL (ref 0.0–1.2)
Total Protein: 9.1 g/dL — ABNORMAL HIGH (ref 6.5–8.1)

## 2023-08-29 LAB — RESP PANEL BY RT-PCR (RSV, FLU A&B, COVID)  RVPGX2
Influenza A by PCR: NEGATIVE
Influenza B by PCR: NEGATIVE
Resp Syncytial Virus by PCR: NEGATIVE
SARS Coronavirus 2 by RT PCR: NEGATIVE

## 2023-08-29 LAB — MAGNESIUM: Magnesium: 1.7 mg/dL (ref 1.7–2.4)

## 2023-08-29 LAB — CBG MONITORING, ED: Glucose-Capillary: 130 mg/dL — ABNORMAL HIGH (ref 70–99)

## 2023-08-29 LAB — TROPONIN I (HIGH SENSITIVITY): Troponin I (High Sensitivity): 13 ng/L (ref <18)

## 2023-08-29 MED ORDER — SODIUM CHLORIDE 0.9 % IV BOLUS
500.0000 mL | Freq: Once | INTRAVENOUS | Status: AC
  Administered 2023-08-29: 500 mL via INTRAVENOUS

## 2023-08-29 MED ORDER — SODIUM CHLORIDE 0.9 % IV BOLUS
1000.0000 mL | Freq: Once | INTRAVENOUS | Status: AC
  Administered 2023-08-29: 1000 mL via INTRAVENOUS

## 2023-08-29 MED ORDER — HYDROMORPHONE HCL 1 MG/ML IJ SOLN
1.0000 mg | Freq: Once | INTRAMUSCULAR | Status: AC
  Administered 2023-08-29: 1 mg via INTRAVENOUS
  Filled 2023-08-29: qty 1

## 2023-08-29 MED ORDER — CALCITONIN (SALMON) 200 UNIT/ML IJ SOLN
4.0000 [IU]/kg | Freq: Once | INTRAMUSCULAR | Status: AC
  Administered 2023-08-29: 388 [IU] via SUBCUTANEOUS
  Filled 2023-08-29: qty 1.94

## 2023-08-29 NOTE — ED Notes (Signed)
Discharge instructions reviewed with patient. Patient questions answered and opportunity for education reviewed. Patient voices understanding of discharge instructions with no further questions. Patient to lobby via wheelchair. 

## 2023-08-29 NOTE — ED Triage Notes (Signed)
 Pt reports with increased confusion, pt has been dealing with elevated calcium levels and was told to come in if he had continued confusion. Pt had chemo tx on Friday. Pt has back and chest pain from broken ribs.

## 2023-08-29 NOTE — ED Notes (Signed)
 I made MD Charm Barges aware of critical I Stat Chem 8

## 2023-08-29 NOTE — ED Provider Notes (Signed)
 [name] Provider Note   CSN: [id number] Arrival date & time: 08/29/23  1259     History {Add pertinent medical, surgical, social history, OB history to HPI:1} Chief Complaint  Patient presents with   Altered Mental Status    Daniel Whitaker is a 78 y.o. adult.  Also history of A-fib with RVR, multiple myeloma, pathologic fractures, hypoxic respiratory failure.  Recently admitted for sepsis pneumonia.  Discharged 9 days ago.  Has been seen by his oncologist Dr. Emeline Darling such and had elevated calcium.  Was given some IV hydration and calcitonin.  Family is noticed increased confusion increased general weakness poorly controlled pain.  Having intermittent spasm which caused him to jerk.  Has known rib fractures due to the myeloma from coughing.  Taking oxycodone without significant improvement.  No new trauma.  No fever.  Has been eating okay and taking his medication.  Lives with family.  He endorses pain in his chest and abdomen into his back.  Family said this has been ongoing and due to his fractures  The history is provided by the patient and a relative.  Altered Mental Status Presenting symptoms: confusion and lethargy   Most recent episode:  Today Episode history:  Continuous Progression:  Unchanged Chronicity:  New Context: recent change in medication, recent illness and recent infection   Associated symptoms: no fever, no headaches and no vomiting        Home Medications Prior to Admission medications   Medication Sig Start Date End Date Taking? Authorizing Provider  acyclovir (ZOVIRAX) 400 MG tablet Take 1 tablet (400 mg total) by mouth daily. 06/09/23   [name], MD  albuterol (VENTOLIN HFA) 108 (90 Base) MCG/ACT inhaler Inhale 1-2 puffs into the lungs every 6 (six) hours as needed for wheezing or shortness of breath.    [provider]  apixaban (ELIQUIS) 5 MG TABS tablet Take 1 tablet (5 mg total) by mouth 2 (two)  times daily. 08/20/23   Alwyn Ren, MD  brimonidine [facility]) 0.2 % ophthalmic solution Place 1 drop into the left eye in the morning and at bedtime. 01/08/20   [provider]  cetirizine (ZYRTEC) 10 MG tablet Take 10 mg by mouth every morning.    [provider]  cyclobenzaprine (FLEXERIL) 10 MG tablet Take 10 mg by mouth daily as needed for muscle spasms.    [provider]  DARZALEX FASPRO 1800-30000 MG-UT/15ML SOLN Inject 1,800 mg into the skin once a week. Every friday 08/04/23   [provider]  DENTA 5000 PLUS 1.1 % CREA dental cream Place 1 Application onto teeth at bedtime. 06/08/23   [provider]  dexamethasone (DECADRON) 4 MG tablet Take 1 pill daily except 4 pills every Friday. 08/25/23   [name], MD  docusate sodium (COLACE) 100 MG capsule Take 300 mg by mouth daily.    [provider]  dorzolamide-timolol (COSOPT) 22.3-6.8 MG/ML ophthalmic solution Place 1 drop into both eyes 2 (two) times daily. 03/09/18   [provider]  furosemide (LASIX) 40 MG tablet Take 1 tablet (40 mg) in the morning and 1/2 tablet (20 mg) in the afternoon 08/25/23   [name], MD  ketoconazole (NIZORAL) 2 % cream Apply 1 application  topically daily as needed for irritation. 11/28/14   [provider]  latanoprost (XALATAN) 0.005 % ophthalmic solution Place 1 drop into both eyes at bedtime. 03/01/13   [provider]  lenalidomide (REVLIMID)  10 MG capsule Take 1 capsule by mouth once daily for 14 days on, 7 days off of 21 day cycle 08/25/23   Bertis Ruddy, Ni, MD  lidocaine (XYLOCAINE) 5 % ointment Apply 1 Application topically 2 (two) times daily as needed. Patient taking differently: Apply 1 Application topically daily as needed for mild pain (pain score 1-3) or moderate pain (pain score 4-6) (for foot pain). 09/29/22   Butch Penny, NP  morphine (MS CONTIN) 15 MG 12 hr tablet Take 1 tablet (15 mg total) by mouth at bedtime.  08/25/23   [name], MD  Multiple Vitamins-Minerals (CENTRUM SILVER 50+MEN) TABS Take 1 tablet by mouth daily with breakfast.    [provider]  nitroGLYCERIN (NITROSTAT) 0.4 MG SL tablet Place 1 tablet (0.4 mg total) under the tongue every 5 (five) minutes as needed. Patient taking differently: Place 0.4 mg under the tongue every 5 (five) minutes as needed for chest pain. 10/22/21   Arty Baumgartner, NP  ondansetron (ZOFRAN) 8 MG tablet Take 1 tablet (8 mg total) by mouth every 8 (eight) hours as needed for nausea or vomiting. 05/11/23   [name], MD  Oxycodone HCl 10 MG TABS Take 1 tablet (10 mg total) by mouth every 4 (four) hours. 08/24/23   [name], MD  pantoprazole (PROTONIX) 40 MG tablet Take 40 mg by mouth daily before breakfast. 03/14/13   [provider]  polyethylene glycol (MIRALAX / GLYCOLAX) 17 g packet Take 17 g by mouth 2 (two) times daily. 07/30/23   [name], MD  pregabalin (LYRICA) 100 MG capsule TAKE 1 CAPSULE BY MOUTH IN THE MORNING, AND AT NOON, AND 2 CAPSULES AT NIGHT Patient taking differently: Take 100 mg by mouth in the morning and at bedtime. 100 mg in the AM and 200 mg in the PM (Also can have 100 mg at lunch) 02/17/23   Ocie Doyne, MD  prochlorperazine (COMPAZINE) 10 MG tablet Take 1 tablet (10 mg total) by mouth every 6 (six) hours as needed for nausea or vomiting. 05/11/23   [name], MD  senna-docusate (SENOKOT-S) 8.6-50 MG tablet Take 2 tablets by mouth 2 (two) times daily. 07/30/23   [name], MD  SSD 1 % cream Apply 1 Application topically daily. 08/09/23   [provider]  triamcinolone (NASACORT) 55 MCG/ACT AERO nasal inhaler Place 2 sprays into the nose daily. Patient taking differently: Place 1 spray into the nose daily as needed. 06/03/23   [name], MD  TYLENOL 500 MG tablet Take 1,000 mg by mouth every 6 (six) hours as needed for mild pain (pain score 1-3) or headache.    [provider]       Allergies    Atorvastatin, Cymbalta [duloxetine hcl], and Penicillins    Review of Systems   Review of Systems  Constitutional:  Negative for fever.  Gastrointestinal:  Negative for vomiting.  Neurological:  Negative for headaches.  Psychiatric/Behavioral:  Positive for confusion.     Physical Exam Updated Vital Signs BP (!) 144/67 (BP Location: Right Arm) Comment: Simultaneous filing. User may not have seen previous data.  Pulse 78 Comment: Simultaneous filing. User may not have seen previous data.  Temp 97.7 F (36.5 C) (Oral) Comment: Simultaneous filing. User may not have seen previous data. Comment (Src): Simultaneous filing. User may not have seen previous data.  Resp 20 Comment: Simultaneous filing. User may not have seen previous data.  SpO2 95% Comment: Simultaneous filing. User may not have seen previous data.  Physical Exam Vitals and nursing note reviewed.  Constitutional:      Appearance: Normal appearance. He is well-developed. He is ill-appearing.  HENT:     Head: Normocephalic and atraumatic.  Eyes:     Conjunctiva/sclera: Conjunctivae normal.  Cardiovascular:     Rate and Rhythm: Normal rate and regular rhythm.     Heart sounds: No murmur heard. Pulmonary:     Effort: Pulmonary effort is normal. No respiratory distress.     Breath sounds: Normal breath sounds.  Abdominal:     Palpations: Abdomen is soft.     Tenderness: There is no abdominal tenderness. There is no guarding or rebound.  Musculoskeletal:        General: No deformity.     Cervical back: Neck supple.     Right lower leg: Edema present.     Left lower leg: Edema present.  Skin:    General: Skin is warm and dry.  Neurological:     General: No focal deficit present.     Mental Status: He is alert.     GCS: GCS eye subscore is 4. GCS verbal subscore is 5. GCS motor subscore is 6.     Comments: Patient is slow to answer although knows it Sunday and knows he is at [facility]  He is able to  move all of extremities but was fairly globally weak.     ED Results / Procedures / Treatments   Labs (all labs ordered are listed, but only abnormal results are displayed) Labs Reviewed  CBG MONITORING, ED - Abnormal; Notable for the following components:      Result Value   Glucose-Capillary 130 (*)    All other components within normal limits    EKG None  Radiology No results found.  Procedures Procedures  {Document cardiac monitor, telemetry assessment procedure when appropriate:1}  Medications Ordered in ED Medications  HYDROmorphone (DILAUDID) injection 1 mg (has no administration in time range)  sodium chloride 0.9 % bolus 500 mL (has no administration in time range)    ED Course/ Medical Decision Making/ A&P   {   Click here for ABCD2, HEART and other calculatorsREFRESH Note before signing :1}                              Medical Decision Making Amount and/or Complexity of Data Reviewed Labs: ordered. Radiology: ordered.  Risk Prescription drug management.   This patient complains of ***; this involves an extensive number of treatment Options and is a complaint that carries with it a high risk of complications and morbidity. The differential includes ***  I ordered, reviewed and interpreted labs, which included *** I ordered medication *** and reviewed PMP when indicated. I ordered imaging studies which included *** and I independently    visualized and interpreted imaging which showed *** Additional history obtained from *** Previous records obtained and reviewed *** I consulted *** and discussed lab and imaging findings and discussed disposition.  Cardiac monitoring reviewed, *** Social determinants considered, *** Critical Interventions: ***  After the interventions stated above, I reevaluated the patient and found *** Admission and further testing considered, ***   {Document critical care time when appropriate:1} {Document review of labs and  clinical decision tools ie heart score, Chads2Vasc2 etc:1}  {Document your independent review of radiology images, and any outside records:1} {Document your discussion with family members, caretakers, and with consultants:1} {Document social determinants of health affecting  pt's care:1} {Document your decision making why or why not admission, treatments were needed:1} Final Clinical Impression(s) / ED Diagnoses Final diagnoses:  None    Rx / DC Orders ED Discharge Orders     None

## 2023-08-29 NOTE — Discharge Instructions (Signed)
 You are seen in the emergency department for continued pain in your chest and back along with increased confusion.  Your calcium was elevated.  You were offered admission to the hospital for further management but felt that you were okay for outpatient follow-up in the oncology clinic tomorrow.  Please continue your regular medications and follow-up with oncology tomorrow.  Return to the emergency department if any worsening or concerning symptoms.

## 2023-08-30 ENCOUNTER — Inpatient Hospital Stay

## 2023-08-30 ENCOUNTER — Inpatient Hospital Stay (HOSPITAL_BASED_OUTPATIENT_CLINIC_OR_DEPARTMENT_OTHER): Admitting: Physician Assistant

## 2023-08-30 ENCOUNTER — Other Ambulatory Visit: Payer: Self-pay | Admitting: Hematology and Oncology

## 2023-08-30 ENCOUNTER — Encounter (HOSPITAL_COMMUNITY): Payer: Self-pay | Admitting: Internal Medicine

## 2023-08-30 ENCOUNTER — Encounter (HOSPITAL_COMMUNITY): Payer: Self-pay

## 2023-08-30 ENCOUNTER — Inpatient Hospital Stay (HOSPITAL_COMMUNITY)
Admission: AD | Admit: 2023-08-30 | Discharge: 2023-09-03 | DRG: 981 | Disposition: A | Discharge status: Home / Self Care | Source: Ambulatory Visit | Attending: Internal Medicine | Admitting: Internal Medicine

## 2023-08-30 DIAGNOSIS — K219 Gastro-esophageal reflux disease without esophagitis: Secondary | ICD-10-CM | POA: Diagnosis present

## 2023-08-30 DIAGNOSIS — I480 Paroxysmal atrial fibrillation: Secondary | ICD-10-CM | POA: Diagnosis not present

## 2023-08-30 DIAGNOSIS — E871 Hypo-osmolality and hyponatremia: Secondary | ICD-10-CM | POA: Diagnosis not present

## 2023-08-30 DIAGNOSIS — G629 Polyneuropathy, unspecified: Secondary | ICD-10-CM | POA: Diagnosis present

## 2023-08-30 DIAGNOSIS — D630 Anemia in neoplastic disease: Secondary | ICD-10-CM | POA: Diagnosis not present

## 2023-08-30 DIAGNOSIS — K5909 Other constipation: Secondary | ICD-10-CM

## 2023-08-30 DIAGNOSIS — G9341 Metabolic encephalopathy: Secondary | ICD-10-CM | POA: Diagnosis present

## 2023-08-30 DIAGNOSIS — C9000 Multiple myeloma not having achieved remission: Secondary | ICD-10-CM | POA: Diagnosis not present

## 2023-08-30 DIAGNOSIS — Z87891 Personal history of nicotine dependence: Secondary | ICD-10-CM

## 2023-08-30 DIAGNOSIS — Z7901 Long term (current) use of anticoagulants: Secondary | ICD-10-CM

## 2023-08-30 DIAGNOSIS — E8581 Light chain (AL) amyloidosis: Secondary | ICD-10-CM | POA: Diagnosis present

## 2023-08-30 DIAGNOSIS — D472 Monoclonal gammopathy: Secondary | ICD-10-CM | POA: Diagnosis present

## 2023-08-30 DIAGNOSIS — E860 Dehydration: Secondary | ICD-10-CM | POA: Diagnosis present

## 2023-08-30 DIAGNOSIS — K5903 Drug induced constipation: Secondary | ICD-10-CM | POA: Diagnosis present

## 2023-08-30 DIAGNOSIS — G893 Neoplasm related pain (acute) (chronic): Secondary | ICD-10-CM | POA: Diagnosis not present

## 2023-08-30 DIAGNOSIS — J9811 Atelectasis: Secondary | ICD-10-CM | POA: Diagnosis not present

## 2023-08-30 DIAGNOSIS — C7951 Secondary malignant neoplasm of bone: Secondary | ICD-10-CM | POA: Diagnosis present

## 2023-08-30 DIAGNOSIS — Z7952 Long term (current) use of systemic steroids: Secondary | ICD-10-CM

## 2023-08-30 DIAGNOSIS — N179 Acute kidney failure, unspecified: Secondary | ICD-10-CM

## 2023-08-30 DIAGNOSIS — Z801 Family history of malignant neoplasm of trachea, bronchus and lung: Secondary | ICD-10-CM

## 2023-08-30 DIAGNOSIS — F1120 Opioid dependence, uncomplicated: Secondary | ICD-10-CM | POA: Diagnosis present

## 2023-08-30 DIAGNOSIS — I2510 Atherosclerotic heart disease of native coronary artery without angina pectoris: Secondary | ICD-10-CM | POA: Diagnosis present

## 2023-08-30 DIAGNOSIS — E876 Hypokalemia: Secondary | ICD-10-CM

## 2023-08-30 DIAGNOSIS — Z880 Allergy status to penicillin: Secondary | ICD-10-CM

## 2023-08-30 DIAGNOSIS — E781 Pure hyperglyceridemia: Secondary | ICD-10-CM | POA: Diagnosis present

## 2023-08-30 DIAGNOSIS — Z79899 Other long term (current) drug therapy: Secondary | ICD-10-CM

## 2023-08-30 DIAGNOSIS — Z8249 Family history of ischemic heart disease and other diseases of the circulatory system: Secondary | ICD-10-CM

## 2023-08-30 DIAGNOSIS — G894 Chronic pain syndrome: Secondary | ICD-10-CM | POA: Diagnosis present

## 2023-08-30 DIAGNOSIS — D638 Anemia in other chronic diseases classified elsewhere: Secondary | ICD-10-CM | POA: Diagnosis present

## 2023-08-30 DIAGNOSIS — Z6827 Body mass index (BMI) 27.0-27.9, adult: Secondary | ICD-10-CM | POA: Diagnosis not present

## 2023-08-30 DIAGNOSIS — G2581 Restless legs syndrome: Secondary | ICD-10-CM | POA: Diagnosis not present

## 2023-08-30 DIAGNOSIS — R4182 Altered mental status, unspecified: Secondary | ICD-10-CM | POA: Diagnosis present

## 2023-08-30 DIAGNOSIS — Z888 Allergy status to other drugs, medicaments and biological substances status: Secondary | ICD-10-CM

## 2023-08-30 DIAGNOSIS — Z1152 Encounter for screening for COVID-19: Secondary | ICD-10-CM

## 2023-08-30 DIAGNOSIS — M1990 Unspecified osteoarthritis, unspecified site: Secondary | ICD-10-CM | POA: Diagnosis not present

## 2023-08-30 DIAGNOSIS — Z79620 Long term (current) use of immunosuppressive biologic: Secondary | ICD-10-CM

## 2023-08-30 DIAGNOSIS — Z808 Family history of malignant neoplasm of other organs or systems: Secondary | ICD-10-CM

## 2023-08-30 DIAGNOSIS — D649 Anemia, unspecified: Secondary | ICD-10-CM | POA: Diagnosis not present

## 2023-08-30 DIAGNOSIS — Z955 Presence of coronary angioplasty implant and graft: Secondary | ICD-10-CM

## 2023-08-30 DIAGNOSIS — H409 Unspecified glaucoma: Secondary | ICD-10-CM | POA: Diagnosis present

## 2023-08-30 LAB — COMPREHENSIVE METABOLIC PANEL WITH GFR
ALT: 8 U/L (ref 0–44)
AST: 9 U/L — ABNORMAL LOW (ref 15–41)
Albumin: 3.2 g/dL — ABNORMAL LOW (ref 3.5–5.0)
Alkaline Phosphatase: 68 U/L (ref 38–126)
Anion gap: 9 (ref 5–15)
BUN: 44 mg/dL — ABNORMAL HIGH (ref 8–23)
CO2: 31 mmol/L (ref 22–32)
Calcium: 13 mg/dL — ABNORMAL HIGH (ref 8.9–10.3)
Chloride: 100 mmol/L (ref 98–111)
Creatinine, Ser: 1.15 mg/dL (ref 0.61–1.24)
GFR, Estimated: >60 mL/min (ref >60)
Glucose, Bld: 114 mg/dL — ABNORMAL HIGH (ref 70–99)
Potassium: 3.1 mmol/L — ABNORMAL LOW (ref 3.5–5.1)
Sodium: 140 mmol/L (ref 135–145)
Total Bilirubin: 0.4 mg/dL (ref 0.0–1.2)
Total Protein: 9.7 g/dL — ABNORMAL HIGH (ref 6.5–8.1)

## 2023-08-30 LAB — CBC WITH DIFFERENTIAL/PLATELET
Abs Immature Granulocytes: 0.03 10*3/uL (ref 0.00–0.07)
Basophils Absolute: 0.1 10*3/uL (ref 0.0–0.1)
Basophils Relative: 1 %
Eosinophils Absolute: 0.2 10*3/uL (ref 0.0–0.5)
Eosinophils Relative: 2 %
HCT: 30.7 % — ABNORMAL LOW (ref 39.0–52.0)
Hemoglobin: 9.4 g/dL — ABNORMAL LOW (ref 13.0–17.0)
Immature Granulocytes: 0 %
Lymphocytes Relative: 14 %
Lymphs Abs: 1.3 10*3/uL (ref 0.7–4.0)
MCH: 27.1 pg (ref 26.0–34.0)
MCHC: 30.6 g/dL (ref 30.0–36.0)
MCV: 88.5 fL (ref 80.0–100.0)
Monocytes Absolute: 0.5 10*3/uL (ref 0.1–1.0)
Monocytes Relative: 6 %
Neutro Abs: 6.7 10*3/uL (ref 1.7–7.7)
Neutrophils Relative %: 77 %
Platelets: 289 10*3/uL (ref 150–400)
RBC: 3.47 MIL/uL — ABNORMAL LOW (ref 4.22–5.81)
RDW: 17.2 % — ABNORMAL HIGH (ref 11.5–15.5)
WBC: 8.7 10*3/uL (ref 4.0–10.5)
nRBC: 0 % (ref 0.0–0.2)

## 2023-08-30 LAB — VITAMIN D 25 HYDROXY (VIT D DEFICIENCY, FRACTURES): Vit D, 25-Hydroxy: 76.86 ng/mL (ref 30–100)

## 2023-08-30 LAB — BASIC METABOLIC PANEL WITH GFR
Anion gap: 13 (ref 5–15)
BUN: 46 mg/dL — ABNORMAL HIGH (ref 8–23)
CO2: 29 mmol/L (ref 22–32)
Calcium: 11.9 mg/dL — ABNORMAL HIGH (ref 8.9–10.3)
Chloride: 99 mmol/L (ref 98–111)
Creatinine, Ser: 1.21 mg/dL (ref 0.61–1.24)
GFR, Estimated: >60 mL/min (ref >60)
Glucose, Bld: 162 mg/dL — ABNORMAL HIGH (ref 70–99)
Potassium: 2.9 mmol/L — ABNORMAL LOW (ref 3.5–5.1)
Sodium: 141 mmol/L (ref 135–145)

## 2023-08-30 LAB — MAGNESIUM: Magnesium: 1.4 mg/dL — ABNORMAL LOW (ref 1.7–2.4)

## 2023-08-30 MED ORDER — TRAZODONE HCL 50 MG PO TABS
50.0000 mg | ORAL_TABLET | Freq: Every evening | ORAL | Status: DC | PRN
  Administered 2023-09-02: 50 mg via ORAL
  Filled 2023-08-30: qty 1

## 2023-08-30 MED ORDER — SODIUM CHLORIDE 0.9 % IV SOLN: INTRAVENOUS | Status: AC

## 2023-08-30 MED ORDER — DOCUSATE SODIUM 100 MG PO CAPS
300.0000 mg | ORAL_CAPSULE | Freq: Every day | ORAL | Status: DC
  Administered 2023-08-30 – 2023-09-03 (×5): 300 mg via ORAL
  Filled 2023-08-30 (×5): qty 3

## 2023-08-30 MED ORDER — FUROSEMIDE 10 MG/ML IJ SOLN
40.0000 mg | Freq: Once | INTRAMUSCULAR | Status: AC
Start: 2023-08-30 — End: 2023-08-30
  Administered 2023-08-30: 40 mg via INTRAVENOUS
  Filled 2023-08-30: qty 4

## 2023-08-30 MED ORDER — ONDANSETRON HCL 4 MG PO TABS: 4.0000 mg | ORAL_TABLET | Freq: Four times a day (QID) | ORAL | Status: DC | PRN

## 2023-08-30 MED ORDER — OXYCODONE HCL 5 MG PO TABS
10.0000 mg | ORAL_TABLET | ORAL | Status: DC | PRN
  Administered 2023-08-31 – 2023-09-03 (×17): 10 mg via ORAL
  Filled 2023-08-30 (×17): qty 2

## 2023-08-30 MED ORDER — ORAL CARE MOUTH RINSE: 15.0000 mL | OROMUCOSAL | Status: DC | PRN

## 2023-08-30 MED ORDER — POTASSIUM CHLORIDE CRYS ER 20 MEQ PO TBCR
40.0000 meq | EXTENDED_RELEASE_TABLET | Freq: Once | ORAL | Status: DC
  Filled 2023-08-30: qty 2

## 2023-08-30 MED ORDER — PREGABALIN 50 MG PO CAPS
100.0000 mg | ORAL_CAPSULE | Freq: Every day | ORAL | Status: DC
  Administered 2023-08-31 – 2023-09-03 (×4): 100 mg via ORAL
  Filled 2023-08-30 (×4): qty 2

## 2023-08-30 MED ORDER — ONDANSETRON HCL 4 MG/2ML IJ SOLN: 4.0000 mg | Freq: Four times a day (QID) | INTRAMUSCULAR | Status: DC | PRN

## 2023-08-30 MED ORDER — FUROSEMIDE 10 MG/ML IJ SOLN
40.0000 mg | Freq: Two times a day (BID) | INTRAMUSCULAR | Status: DC
  Administered 2023-08-30 – 2023-08-31 (×2): 40 mg via INTRAVENOUS
  Filled 2023-08-30 (×2): qty 4

## 2023-08-30 MED ORDER — PREGABALIN 50 MG PO CAPS: 100.0000 mg | ORAL_CAPSULE | ORAL | Status: DC

## 2023-08-30 MED ORDER — POLYETHYLENE GLYCOL 3350 17 G PO PACK
17.0000 g | PACK | Freq: Two times a day (BID) | ORAL | Status: DC
  Administered 2023-08-30 – 2023-09-03 (×7): 17 g via ORAL
  Filled 2023-08-30 (×7): qty 1

## 2023-08-30 MED ORDER — HYDROMORPHONE HCL 1 MG/ML IJ SOLN: 1.0000 mg | INTRAMUSCULAR | Status: DC | PRN

## 2023-08-30 MED ORDER — POTASSIUM CHLORIDE CRYS ER 20 MEQ PO TBCR
40.0000 meq | EXTENDED_RELEASE_TABLET | Freq: Once | ORAL | Status: AC
  Administered 2023-08-30: 40 meq via ORAL
  Filled 2023-08-30: qty 2

## 2023-08-30 MED ORDER — ACETAMINOPHEN 325 MG PO TABS
650.0000 mg | ORAL_TABLET | Freq: Four times a day (QID) | ORAL | Status: DC | PRN
  Administered 2023-08-31 – 2023-09-02 (×2): 650 mg via ORAL
  Filled 2023-08-30 (×2): qty 2

## 2023-08-30 MED ORDER — CYCLOBENZAPRINE HCL 10 MG PO TABS: 10.0000 mg | ORAL_TABLET | Freq: Every day | ORAL | Status: DC | PRN

## 2023-08-30 MED ORDER — MORPHINE SULFATE ER 15 MG PO TBCR
15.0000 mg | EXTENDED_RELEASE_TABLET | Freq: Every day | ORAL | Status: DC
  Administered 2023-08-30 – 2023-09-02 (×3): 15 mg via ORAL
  Filled 2023-08-30 (×4): qty 1

## 2023-08-30 MED ORDER — CALCITONIN (SALMON) 200 UNIT/ML IJ SOLN
380.0000 [IU] | Freq: Two times a day (BID) | INTRAMUSCULAR | Status: AC
  Administered 2023-08-30 – 2023-08-31 (×3): 380 [IU] via SUBCUTANEOUS
  Filled 2023-08-30 (×3): qty 1.9

## 2023-08-30 MED ORDER — APIXABAN 5 MG PO TABS
5.0000 mg | ORAL_TABLET | Freq: Two times a day (BID) | ORAL | Status: DC
  Administered 2023-08-30 – 2023-09-03 (×6): 5 mg via ORAL
  Filled 2023-08-30 (×7): qty 1

## 2023-08-30 MED ORDER — SODIUM CHLORIDE 0.9 % IV SOLN: INTRAVENOUS | Status: DC

## 2023-08-30 MED ORDER — HYDROMORPHONE HCL 1 MG/ML IJ SOLN
0.5000 mg | INTRAMUSCULAR | Status: DC | PRN
  Administered 2023-08-30: 1 mg via INTRAVENOUS
  Filled 2023-08-30: qty 1

## 2023-08-30 MED ORDER — PANTOPRAZOLE SODIUM 40 MG PO TBEC
40.0000 mg | DELAYED_RELEASE_TABLET | Freq: Every day | ORAL | Status: DC
  Administered 2023-08-31 – 2023-09-03 (×4): 40 mg via ORAL
  Filled 2023-08-30 (×4): qty 1

## 2023-08-30 MED ORDER — PREGABALIN 75 MG PO CAPS
200.0000 mg | ORAL_CAPSULE | Freq: Every day | ORAL | Status: DC
  Administered 2023-08-30 – 2023-09-02 (×4): 200 mg via ORAL
  Filled 2023-08-30 (×4): qty 1

## 2023-08-30 MED ORDER — LORATADINE 10 MG PO TABS
10.0000 mg | ORAL_TABLET | Freq: Every day | ORAL | Status: DC
  Administered 2023-08-31 – 2023-09-03 (×4): 10 mg via ORAL
  Filled 2023-08-30 (×4): qty 1

## 2023-08-30 MED ORDER — ACETAMINOPHEN 650 MG RE SUPP: 650.0000 mg | Freq: Four times a day (QID) | RECTAL | Status: DC | PRN

## 2023-08-30 MED ORDER — ACYCLOVIR 400 MG PO TABS
400.0000 mg | ORAL_TABLET | Freq: Every day | ORAL | Status: DC
  Administered 2023-08-31 – 2023-09-03 (×4): 400 mg via ORAL
  Filled 2023-08-30 (×4): qty 1

## 2023-08-30 MED ORDER — ALBUTEROL SULFATE (2.5 MG/3ML) 0.083% IN NEBU
2.5000 mg | INHALATION_SOLUTION | RESPIRATORY_TRACT | Status: DC | PRN
  Administered 2023-08-31: 2.5 mg via RESPIRATORY_TRACT
  Filled 2023-08-30: qty 3

## 2023-08-30 NOTE — Progress Notes (Signed)
   08/30/23 1956  BiPAP/CPAP/SIPAP  BiPAP/CPAP/SIPAP Pt Type Adult  BiPAP/CPAP/SIPAP Resmed  Mask Type Nasal mask  Dentures removed? Not applicable  Respiratory Rate 16 breaths/min  Flow Rate 4 lpm  Heater Temperature  (distilled water added to humidifier)  Patient Home Machine Yes  Safety Check Completed by RT for Home Unit Yes, no issues noted  Patient Home Mask Yes  Patient Home Tubing Yes  Auto Titrate Yes  Minimum cmH2O 5 cmH2O  Maximum cmH2O 11 cmH2O  Device Plugged into RED Power Outlet Yes  BiPAP/CPAP /SiPAP Vitals  Pulse Rate 62  Resp 16  SpO2 93 %

## 2023-08-30 NOTE — Plan of Care (Signed)

## 2023-08-30 NOTE — Progress Notes (Signed)
 Symptom Management Consult Note [facility]    Patient Care Team: [name], MD as PCP - General (Family Medicine) [name], MD as PCP - Cardiology (Cardiology)    Name / MRN / DOB: Rowen Wilmer  [id number]  [birth year]   Date of visit: 08/30/2023   Chief Complaint/Reason for visit: hypercalcemia    Current Therapy: darazlex and faspro  Last treatment:  Day 1   Cycle 5 on 08/27/23    Assessment & Plan Patient is a 78 y.o. adult with oncologic history of multiple myeloma followed by Dr. Bertis Ruddy.  I have viewed most recent oncology note and lab work.  #Hypercalcemia -Persistent hypercalcemia with calcium level at 13 mg/dL despite outpatient treatment with fluids and calcitonin. Patient is symptomatic with confusion, lethargy, and muscle spasms therefore is appropriate for hospital admission - Patient started on 1L NS in clinic. He received an inpatient bed quickly therefore the IV potassium and IV calcitonin were not administered as planned.  #Multiple Myeloma -Scheduled to see Dr. Bertis Ruddy tomorrow 04/01. She will be notified of his admission.  #Pain Management -Patient has significant pain that is currently managed with oxycodone every four hours and morphine at night. Family has concerns about pain management during hospital admission due to delays in administering pain medications during previous admissions. - Oncology requests that home pain regimen be continued during hospital stay.   Spoke with Dr. Erenest Blank with hospitalist service who agrees to assume care of patient and bring into the hospital for further evaluation and management.      Heme/Onc History: Oncology History Overview Note  Myeloma FISH was neg Normal cytogenetics   Multiple myeloma (HCC)  07/11/2013 Initial Diagnosis   Multiple myeloma without remission (HCC)   04/22/2023 PET scan   NM PET Image Initial (PI) Whole Body (F-18 FDG)  Result Date: 05/03/2023 CLINICAL  DATA:  Subsequent treatment strategy for multiple myeloma. EXAM: NUCLEAR MEDICINE PET WHOLE BODY TECHNIQUE: 13 mCi F-18 FDG was injected intravenously. Full-ring PET imaging was performed from the head to foot after the radiotracer. CT data was obtained and used for attenuation correction and anatomic localization. Fasting blood glucose: 114 mg/dl COMPARISON:  None Available. FINDINGS: Mediastinal blood pool activity: SUV max 2.0 HEAD/NECK: No hypermetabolic activity in the scalp. No hypermetabolic cervical lymph nodes. Incidental CT findings: Is CHEST: No hypermetabolic mediastinal or hilar nodes. No suspicious pulmonary nodules on the CT scan. Incidental CT findings: none ABDOMEN/PELVIS: No abnormal hypermetabolic activity within the liver, pancreas, adrenal glands, or spleen. No hypermetabolic lymph nodes in the abdomen or pelvis. Incidental CT findings: none SKELETON: There multiple foci focal radiotracer activity within the skeleton. Lesions are accompaniedlytic change on CT portion exam. There approximately 50 lesions. For example lesion the posterior RIGHT fifth rib with SUV max equal 3.6 on image 120. There is a lucent lesion and pathologic fracture associated with hypermetabolic activity the posterior LEFT sixth rib with SUV max equal 6.4 on image 129. Multiple lesions throughout the spine. Most intense lesion spinal lesion has associated soft tissue expansion at the the T2 vertebral body measuring 2.5 cm (107) with SUV max equal 7.3 Lesion in the inferior LEFT pubic rami subtle lucency on image 299. Suspicious lesions in LEFT and RIGHT femoral necks. Incidental CT findings: none EXTREMITIES: Hypermetabolic lesions RIGHT humeral shaft. Lesion the proximal LEFT and RIGHT femurs. Incidental CT findings: none IMPRESSION: 1. Multiple hypermetabolic lytic lesions throughout the skeleton consistent with multiple myeloma. Approximately 50 lesions within the axillary  and appendicular skeleton. Many lesions  accompanied lytic/lucent lesions on CT. 2. No evidence of soft tissue plasmacytoma. Electronically Signed   By: Genevive Bi M.D.   On: 05/03/2023 15:16   CT Chest W Contrast  Result Date: 05/03/2023 CLINICAL DATA:  Pneumonia with complication suspected. X-ray done. Cough with rib pain after coughing. Shortness of breath and right-sided pain. History of multiple myeloma. EXAM: CT CHEST WITH CONTRAST TECHNIQUE: Multidetector CT imaging of the chest was performed during intravenous contrast administration. RADIATION DOSE REDUCTION: This exam was performed according to the departmental dose-optimization program which includes automated exposure control, adjustment of the mA and/or kV according to patient size and/or use of iterative reconstruction technique. CONTRAST:  60mL OMNIPAQUE IOHEXOL 300 MG/ML  SOLN COMPARISON:  Chest radiograph 05/02/2023.  PET-CT 04/22/2023 FINDINGS: Cardiovascular: Mild cardiac enlargement. No pericardial effusions. Normal caliber thoracic aorta. Calcification of the aorta and coronary arteries. Mediastinum/Nodes: Thyroid gland is unremarkable. Esophagus is decompressed. Scattered mediastinal lymph nodes are not pathologically enlarged. Lungs/Pleura: Motion artifact limits examination. There is evidence of atelectasis or consolidation in both lung bases. Small bilateral pleural effusions. Changes could represent pneumonia. No pneumothorax. Upper Abdomen: No acute abnormalities. Musculoskeletal: Degenerative changes in the spine and shoulders. Multiple lucent lesions demonstrated in the ribs, spine, and shoulders consistent with history of myeloma. Fractures of the right lateral fifth, sixth, and seventh ribs as well as the right tenth rib. Fractures of the left third, fourth, and sixth ribs. No vertebral compression deformities are demonstrated although large lucent lesions are demonstrated in the T1 and T2 vertebrae. IMPRESSION: 1. Multiple lucent lesions demonstrated in the ribs,  spine, and shoulders corresponding to the patient's history of multiple myeloma. 2. Multiple bilateral acute rib fractures. 3. Small bilateral pleural effusions with basilar atelectasis or consolidation, possibly pneumonia. Electronically Signed   By: Burman Nieves M.D.   On: 05/03/2023 01:21   DG Chest Port 1 View  Result Date: 05/02/2023 CLINICAL DATA:  Shortness of breath and cough. History of multiple myeloma. Right-sided pain. EXAM: PORTABLE CHEST 1 VIEW COMPARISON:  04/20/2023 FINDINGS: Shallow inspiration. Cardiac enlargement. No vascular congestion or edema. Linear atelectasis in the lung bases. No pleural effusions. No pneumothorax. Calcification of the aorta. A nondisplaced right lateral ninth rib fracture is suggested. IMPRESSION: Shallow inspiration with atelectasis in the lung bases. Cardiac enlargement. Probable right ninth rib fracture. Electronically Signed   By: Burman Nieves M.D.   On: 05/02/2023 23:45   CT BONE MARROW BIOPSY & ASPIRATION  Result Date: 04/27/2023 INDICATION: myeloma staging EXAM: CT GUIDED BONE MARROW ASPIRATION AND CORE BIOPSY MEDICATIONS: None. ANESTHESIA/SEDATION: Moderate (conscious) sedation was employed during this procedure. A total of Versed 2 mg and Fentanyl 100 mcg was administered intravenously. Moderate Sedation Time: 13 minutes. The patient's level of consciousness and vital signs were monitored continuously by radiology nursing throughout the procedure under my direct supervision. FLUOROSCOPY TIME:  CT dose; 122 mGycm COMPLICATIONS: None immediate. Estimated blood loss: <5 mL PROCEDURE: RADIATION DOSE REDUCTION: This exam was performed according to the departmental dose-optimization program which includes automated exposure control, adjustment of the mA and/or kV according to patient size and/or use of iterative reconstruction technique. Informed written consent was obtained from the patient after a thorough discussion of the procedural risks, benefits  and alternatives. All questions were addressed. Maximal Sterile Barrier Technique was utilized including caps, mask, sterile gowns, sterile gloves, sterile drape, hand hygiene and skin antiseptic. A timeout was performed prior to the initiation of the procedure. The patient  was positioned prone and non-contrast localization CT was performed of the pelvis to demonstrate the iliac marrow spaces. Maximal barrier sterile technique utilized including caps, mask, sterile gowns, sterile gloves, large sterile drape, hand hygiene, and chlorhexidine prep. Under sterile conditions and local anesthesia, an 11 gauge coaxial bone biopsy needle was advanced into the RIGHT iliac marrow space. Needle position was confirmed with CT imaging. Initially, bone marrow aspiration was performed. Next, the 11 gauge outer cannula was utilized to obtain a 1 iliac bone marrow core biopsy. Needle was removed. Hemostasis was obtained with compression. The patient tolerated the procedure well. Samples were prepared with the cytotechnologist. IMPRESSION: Successful CT-guided bone marrow aspiration and biopsy. Roanna Banning, MD [facility] Radiology Electronically Signed   By: Roanna Banning M.D.   On: 04/27/2023 12:32   DG Chest 2 View  Result Date: 04/23/2023 CLINICAL DATA:  Cough for 2 months Pain between shoulder blades EXAM: CHEST - 2 VIEW COMPARISON:  08/24/2019 FINDINGS: Cardiomediastinal silhouette and pulmonary vasculature are within normal limits. Lungs are hyperexpanded but otherwise clear. Unchanged dextroconvex curvature of the thoracolumbar spine. IMPRESSION: 1. No acute cardiopulmonary process. 2. Hyperexpanded lungs, suspicious for emphysema. Electronically Signed   By: Acquanetta Belling M.D.   On: 04/23/2023 16:44      04/27/2023 Pathology Results   Surgical Pathology CASE: [id number] PATIENT: Stephanie Coup Bone Marrow Report  Clinical History: Myeloma  DIAGNOSIS:  BONE  MARROW, ASPIRATE, CLOT, CORE: -Hypercellular bone marrow with plasma cell neoplasm -See comment  PERIPHERAL BLOOD: -Normocytic-normochromic anemia -Leukocytosis with neutrophilic left shift  COMMENT:  The bone marrow is hypercellular for age with increased number of plasma cells averaging 15% of all cells in the aspirate.  The clot and biopsy sections show interstitial infiltrates, numerous variably sized clusters in addition to an area of diffuse sheet of atypical plasma cells associated with kappa light chain restriction consistent with plasma cell neoplasm.  Correlation with cytogenetic and FISH studies is recommended.  MICROSCOPIC DESCRIPTION:  PERIPHERAL BLOOD SMEAR: The red blood cells display mild anisopoikilocytosis with mild to moderate polychromasia.  The white blood cells are slightly increased in number, mostly with neutrophils some of which display toxic granulation.  An occasional myelocyte is seen on scan.  The platelets are normal in number.  BONE MARROW ASPIRATE: Bone marrow particles present Erythroid precursors: Orderly and progressive maturation Granulocytic precursors: Orderly and progressive maturation for the most part Megakaryocytes: Abundant with scattered small and/or hypolobated forms or large cells. Lymphocytes/plasma cells: The plasma cells are increased in number representing 15% to all cells associated with atypical cytologic features characterized by cytomegaly and/or small nucleoli.  Large lymphoid aggregates are not seen.  TOUCH PREPARATIONS: A mixture of cell types present but with increased number of plasma cells  CLOT AND BIOPSY: The sections show variable cellularity ranging from 40 to 90% focally.  There are numerous variably sized clusters of atypical plasma cells in addition to a diffuse large sheet particularly in the area of most cellularity.  The background shows trilineage hematopoiesis.  Immunohistochemical stains for CD138 in addition to in situ  hybridization for kappa and lambda were performed with appropriate controls.  CD138 highlights the increased plasma cell component consisting of interstitial infiltrates, numerous variably sized clusters, and an area of diffuse sheet particularly in the core biopsy. The plasma cells display kappa light chain restriction.  IRON STAIN: Iron stains are performed on a bone marrow aspirate or touch imprint smear and section of clot. The controls  stained appropriately.       Storage Iron: Decreased      Ring Sideroblasts: Absent  ADDITIONAL DATA/TESTING: The specimen was sent for cytogenetic analysis and FISH for multiple myeloma and a separate report will follow.    05/04/2023 Cancer Staging   Staging form: Plasma Cell Myeloma and Plasma Cell Disorders, AJCC 8th Edition - Clinical stage from 05/04/2023: RISS Stage II (Beta-2-microglobulin (mg/L): 5.6, Albumin (g/dL): 2.5, ISS: Stage III, High-risk cytogenetics: Absent, LDH: Normal) - Signed by [name], MD on 05/11/2023 Stage prefix: Initial diagnosis Beta 2 microglobulin range (mg/L): Greater than or equal to 5.5 Albumin range (g/dL): Less than 3.5 Cytogenetics: No abnormalities   05/14/2023 -  Chemotherapy   Patient is on Treatment Plan : MYELOMA NEWLY DIAGNOSED TRANSPLANT CANDIDATE DaraVRd (Daratumumab SQ) q21d x 6 Cycles (Induction/Consolidation)     06/18/2023 Imaging   RIGHT:         - There is no evidence of deep vein thrombosis in the lower extremity.   - No cystic structure found in the popliteal fossa.   LEFT: - No evidence of common femoral vein obstruction.           Interval history-: MATTI MINNEY is a 78 y.o. adult with oncologic history as above presenting to [facility] today with chief complaint of hypercalcemia. He is accompanied by his daughter and son [name] is on the phone to provide history as well.  Discussed the use of AI scribe software for clinical note transcription with the patient, who gave verbal consent  to proceed.  History of Present Illness His confusion and lethargy have worsened since Sunday. He was seen in the emergency room for these symptoms yesterday, where he received fluids and calcitonin. Clcium was 12.7 yesterday.  ED provider encouraged admission however family wanted to follow up with oncology and give the IVF and calcitonin "more time to work."  He experiences significant bone pain, described as 'terrible' and localized to his back, exacerbated by sitting. This has been ongoing x 1 week. He has a history of atrial fibrillation and was recently hospitalized in the ICU for this condition. During that hospitalization, he also had an infection, possibly pneumonia, and was treated with antibiotics, which he completed last Wednesday (3/26). He underwent dental work last week, which was necessary before starting Zometa. The dental procedures were completed on Tuesday and Wednesday, and he was not placed on antibiotics post-procedure as he was finishing the antibiotic from treatment of pneumonia.   He is currently on Revlimid which he takes for 14 days on and 7 days off. Today marks the fifth day of his current cycle. He also takes oxycodone every four hours for pain management, which has been his routine for the past three months.  No new fevers or other concerning symptoms. However, he has been experiencing muscle twitching.    ROS  All other systems are reviewed and are negative for acute change except as noted in the HPI.    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   Nocturnal leg cramps 03/11/2021   Nontoxic uninodular goiter    Obesity    Peripheral neuropathy    RLS (restless legs syndrome)    Spinal stenosis    Unspecified deficiency anemia 07/11/2013     Past Surgical History:  Procedure Laterality Date   CARDIAC CATHETERIZATION     COLON RESECTION N/A 04/21/2013   Procedure: DIAGNOSTIC LAPAROSCOPY, lysis of adhesions for partial small bowel obstruction;  Surgeon: Adolph Pollack, MD;  Location: [facility] ORS;  Service: General;  Laterality: N/A;   COLON SURGERY     partial SBO    COLONOSCOPY     CORONARY STENT INTERVENTION N/A 10/22/2021   Procedure: CORONARY STENT INTERVENTION;  Surgeon: Marykay Lex, MD;  Location: [facility] INVASIVE CV LAB;  Service: Cardiovascular;  Laterality: N/A;   KNEE SURGERY     Arthroscopic   LEFT HEART CATH AND CORONARY ANGIOGRAPHY N/A 10/22/2021   Procedure: LEFT HEART CATH AND CORONARY ANGIOGRAPHY;  Surgeon: Marykay Lex, MD;  Location: [facility] INVASIVE CV LAB;  Service: Cardiovascular;  Laterality: N/A;   SMALL INTESTINE SURGERY     Blockage    Social History   Socioeconomic History   Marital status: Married    Spouse name: Bonita Quin   Number of children: 2   Years of education: GED   Highest education level: Not on file  Occupational History   Occupation: still working  Tobacco Use   Smoking status: Former    Current packs/day: 0.00    Types: Cigarettes    Quit date: 06/01/1984    Years since quitting: 39.2   Smokeless tobacco: Never  Vaping Use   Vaping status: Never Used  Substance and Sexual Activity   Alcohol use: Not Currently    Alcohol/week: 10.0 standard drinks of alcohol    Types: 5 Glasses of wine, 5 Cans of beer per week   Drug use: No   Sexual activity: Not on file  Other Topics Concern   Not on file  Social History Narrative   Patient lives at home with his wife Bonita Quin)   Retired.    Patient has  two children.   Patient is right-handed.   Patient has his GED.   Patient does not drink caffeine.   Social Drivers of Corporate investment banker Strain: Not on file  Food Insecurity: No Food Insecurity (08/30/2023)   Hunger Vital Sign    Worried About Running Out of Food in the Last Year: Never true    Ran Out of Food in the Last Year: Never true  Transportation Needs: No Transportation Needs (08/30/2023)   PRAPARE - Administrator, Civil Service (Medical): No    Lack of Transportation (Non-Medical): No  Physical Activity: Not on file  Stress: Not on file  Social Connections: Moderately Integrated (08/30/2023)   Social Connection and Isolation Panel [NHANES]    Frequency of Communication with Friends and Family: Three times a week    Frequency of Social Gatherings with Friends and Family: Three times a week    Attends Religious Services: 1 to 4 times per year    Active Member of Clubs or Organizations: No    Attends Banker Meetings: Never    Marital Status: Married  Catering manager Violence: Not At Risk (08/17/2023)   Humiliation, Afraid, Rape, and Kick questionnaire    Fear of  Current or Ex-Partner: No    Emotionally Abused: No    Physically Abused: No    Sexually Abused: No    Family History  Problem Relation Age of Onset   Brain cancer Mother    Lung cancer Father    Heart disease Brother        No details   Dementia Brother    Neuropathy Neg Hx    Colon cancer Neg Hx    Colon polyps Neg Hx    Esophageal cancer Neg Hx    Rectal cancer Neg Hx    Stomach cancer Neg Hx    Sleep apnea Neg Hx     No current facility-administered medications for this visit. No current outpatient medications on file.  Facility-Administered Medications Ordered in Other Visits:    0.9 %  sodium chloride infusion, , Intravenous, Continuous, Kirby Crigler, Mir M, MD, Last Rate: 200 mL/hr at 08/30/23 1200, New Bag at 08/30/23 1200   acetaminophen (TYLENOL) tablet 650  mg, 650 mg, Oral, Q6H PRN **OR** acetaminophen (TYLENOL) suppository 650 mg, 650 mg, Rectal, Q6H PRN, Kirby Crigler, Mir M, MD   Melene Muller ON 08/31/2023] acyclovir (ZOVIRAX) tablet 400 mg, 400 mg, Oral, Daily, Kirby Crigler, Mir M, MD   albuterol (PROVENTIL) (2.5 MG/3ML) 0.083% nebulizer solution 2.5 mg, 2.5 mg, Nebulization, Q2H PRN, Kirby Crigler, Mir M, MD   apixaban (ELIQUIS) tablet 5 mg, 5 mg, Oral, BID, Kirby Crigler, Mir M, MD   calcitonin (MIACALCIN) injection 380 Units, 380 Units, Subcutaneous, BID, Kirby Crigler, Mir M, MD   cyclobenzaprine (FLEXERIL) tablet 10 mg, 10 mg, Oral, Daily PRN, Kirby Crigler, Mir M, MD   docusate sodium (COLACE) capsule 300 mg, 300 mg, Oral, Daily, Kirby Crigler, Mir M, MD, 300 mg at 08/30/23 1407   HYDROmorphone (DILAUDID) injection 0.5-1 mg, 0.5-1 mg, Intravenous, Q2H PRN, Kirby Crigler, Mir M, MD, 1 mg at 08/30/23 1158   [START ON 08/31/2023] loratadine (CLARITIN) tablet 10 mg, 10 mg, Oral, Daily, Kirby Crigler, Mir M, MD   morphine (MS CONTIN) 12 hr tablet 15 mg, 15 mg, Oral, QHS, Kirby Crigler, Mir M, MD   ondansetron (ZOFRAN) tablet 4 mg, 4 mg, Oral, Q6H PRN **OR** ondansetron (ZOFRAN) injection 4 mg, 4 mg, Intravenous, Q6H PRN, Kirby Crigler, Mir M, MD   oxyCODONE (Oxy IR/ROXICODONE) immediate release tablet 10 mg, 10 mg, Oral, Q4H PRN, Kirby Crigler, Mir M, MD   Melene Muller ON 08/31/2023] pantoprazole (PROTONIX) EC tablet 40 mg, 40 mg, Oral, QAC breakfast, Kirby Crigler, Mir M, MD   polyethylene glycol (MIRALAX / GLYCOLAX) packet 17 g, 17 g, Oral, BID, Kirby Crigler, Mir M, MD, 17 g at 08/30/23 1406   potassium chloride SA (KLOR-CON M) CR tablet 40 mEq, 40 mEq, Oral, Once, Kirby Crigler, Mir M, MD   Melene Muller ON 08/31/2023] pregabalin (LYRICA) capsule 100 mg, 100 mg, Oral, Daily, Wofford, Drew A, RPH   pregabalin (LYRICA) capsule 200 mg, 200 mg, Oral, QHS, Wofford, Drew A, RPH   traZODone (DESYREL) tablet 50 mg, 50 mg, Oral, QHS PRN, Kirby Crigler, Mir M, MD  PHYSICAL EXAM: ECOG FS:2 - Symptomatic, <50% confined to  bed    Vitals:   08/30/23 0900  BP: (!) 162/78  Pulse: 75  Resp: (!) 22  Temp: 97.7 F (36.5 C)  TempSrc: Temporal  SpO2: 99%  Weight: 210 lb (95.3 kg)   Physical Exam Vitals and nursing note reviewed.  Constitutional:      General: He is not in acute distress.    Appearance: He is ill-appearing. He is not toxic-appearing.  HENT:     Head:  Normocephalic and atraumatic.     Right Ear: External ear normal.     Left Ear: External ear normal.     Nose: Nose normal.     Mouth/Throat:     Mouth: Mucous membranes are dry.     Pharynx: Oropharynx is clear.  Eyes:     General: No scleral icterus.       Right eye: No discharge.        Left eye: No discharge.     Conjunctiva/sclera: Conjunctivae normal.  Neck:     Vascular: No JVD.  Cardiovascular:     Rate and Rhythm: Normal rate and regular rhythm.     Pulses: Normal pulses.          Radial pulses are 2+ on the right side and 2+ on the left side.     Heart sounds: Normal heart sounds.  Pulmonary:     Effort: Pulmonary effort is normal.     Breath sounds: Normal breath sounds.     Comments: Lungs clear to auscultation in all fields. Symmetric chest rise. No wheezing, rales, or rhonchi. Abdominal:     General: There is no distension.     Palpations: Abdomen is soft.     Tenderness: There is no abdominal tenderness.  Musculoskeletal:        General: Normal range of motion.     Cervical back: Normal range of motion.  Skin:    General: Skin is warm and dry.     Capillary Refill: Capillary refill takes less than 2 seconds.  Neurological:     General: No focal deficit present.     Mental Status: He is alert and oriented to person, place, and time.     GCS: GCS eye subscore is 4. GCS verbal subscore is 5. GCS motor subscore is 6.     Comments: Fluent speech, no facial droop.        LABORATORY DATA: I have reviewed the data as listed    Latest Ref Rng & Units 08/30/2023    8:41 AM 08/29/2023    1:52 PM 08/29/2023     1:36 PM  CBC  WBC 4.0 - 10.5 K/uL 8.7   9.2   Hemoglobin 13.0 - 17.0 g/dL 9.4  9.5  9.1   Hematocrit 39.0 - 52.0 % 30.7  28.0  29.6   Platelets 150 - 400 K/uL 289   260         Latest Ref Rng & Units 08/30/2023    8:41 AM 08/29/2023    1:52 PM 08/29/2023    1:36 PM  CMP  Glucose 70 - 99 mg/dL 696  295  284   BUN 8 - 23 mg/dL 44  40  47   Creatinine 0.61 - 1.24 mg/dL 1.32  4.40  1.02   Sodium 135 - 145 mmol/L 140  140  138   Potassium 3.5 - 5.1 mmol/L 3.1  3.2  3.1   Chloride 98 - 111 mmol/L 100  101  100   CO2 22 - 32 mmol/L 31   27   Calcium 8.9 - 10.3 mg/dL 72.5   36.6   Total Protein 6.5 - 8.1 g/dL 9.7   9.1   Total Bilirubin 0.0 - 1.2 mg/dL 0.4   0.4   Alkaline Phos 38 - 126 U/L 68   66   AST 15 - 41 U/L 9   13   ALT 0 - 44 U/L 8   11  RADIOGRAPHIC STUDIES (from last 24 hours if applicable) I have personally reviewed the radiological images as listed and agreed with the findings in the report. CT Head Wo Contrast Result Date: 08/29/2023 CLINICAL DATA:  Increasing confusion, hypercalcemia, multiple myeloma EXAM: CT HEAD WITHOUT CONTRAST TECHNIQUE: Contiguous axial images were obtained from the base of the skull through the vertex without intravenous contrast. RADIATION DOSE REDUCTION: This exam was performed according to the departmental dose-optimization program which includes automated exposure control, adjustment of the mA and/or kV according to patient size and/or use of iterative reconstruction technique. COMPARISON:  10/16/2013, 04/22/2023 FINDINGS: Brain: No acute infarct or hemorrhage. Lateral ventricles and midline structures are unremarkable. No acute extra-axial fluid collections. No mass effect. Vascular: Atherosclerosis of the internal carotid arteries. No hyperdense vessel. Skull: Multiple new rounded lytic lesions are seen throughout the calvarium since the prior PET scan, consistent with progression of known multiple myeloma. There are no acute displaced  fractures. Sinuses/Orbits: Left mastoid effusion. The paranasal sinuses are clear. Other: None. IMPRESSION: 1. No acute intracranial process. 2. Lytic lesions seen throughout the calvarium, increased since prior exam, consistent with progression of known multiple myeloma. 3. Left mastoid effusion. Electronically Signed   By: Sharlet Salina M.D.   On: 08/29/2023 15:41        Visit Diagnosis: 1. Hypercalcemia   2. Cancer related pain   3. Multiple myeloma without remission (HCC)   4. Hypokalemia      Orders Placed This Encounter  Procedures   Magnesium    Standing Status:   Future    Number of Occurrences:   1    Expected Date:   08/30/2023    Expiration Date:   08/29/2024    All questions were answered. The patient knows to call the clinic with any problems, questions or concerns. No barriers to learning was detected.  A total of more than 40 minutes were spent on this encounter with face-to-face time and non-face-to-face time, including preparing to see the patient, ordering tests and/or medications, counseling the patient and coordination of care as outlined above.    Thank you for allowing me to participate in the care of this patient.    Shanon Ace, PA-C Department of Hematology/Oncology [facility] at [facility] Phone: [phone]  [phone]    08/30/2023 2:59 PM

## 2023-08-30 NOTE — Progress Notes (Signed)
 Patient direct admitted to hospital for hypercalcemia. Report given to [place], [place].  Patient transferred on 4 L oxygen via [place] to room 1618. 22 G PIV saline locked and left in place.  Pt stable at discharge. Bedside report given. Family members present at time of transfer.

## 2023-08-30 NOTE — Plan of Care (Signed)
  Problem: Education: Goal: Knowledge of General Education information will improve Description: Including pain rating scale, medication(s)/side effects and non-pharmacologic comfort measures 08/30/2023 2019 by Meredith Leeds, RN Outcome: Progressing 08/30/2023 2018 by Meredith Leeds, RN Outcome: Progressing   Problem: Health Behavior/Discharge Planning: Goal: Ability to manage health-related needs will improve 08/30/2023 2019 by Meredith Leeds, RN Outcome: Progressing 08/30/2023 2018 by Meredith Leeds, RN Outcome: Progressing   Problem: Clinical Measurements: Goal: Ability to maintain clinical measurements within normal limits will improve 08/30/2023 2019 by Meredith Leeds, RN Outcome: Progressing 08/30/2023 2018 by Meredith Leeds, RN Outcome: Progressing Goal: Will remain free from infection 08/30/2023 2019 by Meredith Leeds, RN Outcome: Progressing 08/30/2023 2018 by Meredith Leeds, RN Outcome: Progressing Goal: Diagnostic test results will improve 08/30/2023 2019 by Meredith Leeds, RN Outcome: Progressing 08/30/2023 2018 by Meredith Leeds, RN Outcome: Progressing Goal: Respiratory complications will improve 08/30/2023 2019 by Meredith Leeds, RN Outcome: Progressing 08/30/2023 2018 by Meredith Leeds, RN Outcome: Progressing Goal: Cardiovascular complication will be avoided 08/30/2023 2019 by Meredith Leeds, RN Outcome: Progressing 08/30/2023 2018 by Meredith Leeds, RN Outcome: Progressing   Problem: Activity: Goal: Risk for activity intolerance will decrease 08/30/2023 2019 by Meredith Leeds, RN Outcome: Progressing 08/30/2023 2018 by Meredith Leeds, RN Outcome: Progressing   Problem: Nutrition: Goal: Adequate nutrition will be maintained 08/30/2023 2019 by Meredith Leeds, RN Outcome: Progressing 08/30/2023 2018 by Meredith Leeds, RN Outcome: Progressing   Problem: Coping: Goal: Level of anxiety will decrease 08/30/2023 2019 by Meredith Leeds, RN Outcome: Progressing 08/30/2023 2018 by Meredith Leeds, RN Outcome: Progressing    Problem: Elimination: Goal: Will not experience complications related to bowel motility 08/30/2023 2019 by Meredith Leeds, RN Outcome: Progressing 08/30/2023 2018 by Meredith Leeds, RN Outcome: Progressing Goal: Will not experience complications related to urinary retention 08/30/2023 2019 by Meredith Leeds, RN Outcome: Progressing 08/30/2023 2018 by Meredith Leeds, RN Outcome: Progressing   Problem: Pain Managment: Goal: General experience of comfort will improve and/or be controlled 08/30/2023 2019 by Meredith Leeds, RN Outcome: Progressing 08/30/2023 2018 by Meredith Leeds, RN Outcome: Progressing   Problem: Safety: Goal: Ability to remain free from injury will improve 08/30/2023 2019 by Meredith Leeds, RN Outcome: Progressing 08/30/2023 2018 by Meredith Leeds, RN Outcome: Progressing   Problem: Skin Integrity: Goal: Risk for impaired skin integrity will decrease 08/30/2023 2019 by Meredith Leeds, RN Outcome: Progressing 08/30/2023 2018 by Meredith Leeds, RN Outcome: Progressing

## 2023-08-30 NOTE — H&P (Signed)
 History and Physical  Daniel Whitaker:[id number] DOB: [birth year] DOA: 08/30/2023  PCP: [name], MD   Chief Complaint: Hypercalcemia  HPI: Daniel Whitaker is a 78 y.o. adult with medical history significant for multiple myeloma with bony involvement being admitted to the hospital with symptomatic persistent hypercalcemia.  Per family, he is symptomatic with confusion, lethargy and muscle spasms.  He has been managed closely by the oncology service for his hypercalcemia, he received IV fluids multiple times in the clinic, also was given increasing doses of p.o. Lasix as well as oral Decadron for the last week.  Per family, he received Zometa back in January, but he had some recent dental work and so plan was to try to hold off on bisphosphonates until next week if possible.  Yesterday he was seen in the emergency department, was given IV fluids, as well as calcitonin.  Family preferred yesterday to take him home after he was feeling a little better, with plan to follow-up with oncology today.  Today, lab work shows persistent hypercalcemia with calcium level 13.  He was therefore directly admitted to the hospital from oncology clinic.  Review of Systems: Please see HPI for pertinent positives and negatives. A complete 10 system review of systems are otherwise negative.  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   Nocturnal leg cramps 03/11/2021   Nontoxic uninodular goiter    Obesity    Peripheral neuropathy    RLS (restless legs syndrome)    Spinal stenosis    Unspecified deficiency anemia 07/11/2013   Past Surgical History:  Procedure Laterality Date   CARDIAC CATHETERIZATION      COLON RESECTION N/A 04/21/2013   Procedure: DIAGNOSTIC LAPAROSCOPY, lysis of adhesions for partial small bowel obstruction;  Surgeon: Adolph Pollack, MD;  Location: [facility];  Service: General;  Laterality: N/A;   COLON SURGERY     partial SBO    COLONOSCOPY     CORONARY STENT INTERVENTION N/A 10/22/2021   Procedure: CORONARY STENT INTERVENTION;  Surgeon: Marykay Lex, MD;  Location: [facility] INVASIVE CV LAB;  Service: Cardiovascular;  Laterality: N/A;   KNEE SURGERY     Arthroscopic   LEFT HEART CATH AND CORONARY ANGIOGRAPHY N/A 10/22/2021   Procedure: LEFT HEART CATH AND CORONARY ANGIOGRAPHY;  Surgeon: Marykay Lex, MD;  Location: [facility] INVASIVE CV LAB;  Service: Cardiovascular;  Laterality: N/A;   SMALL INTESTINE SURGERY     Blockage   Social History:  reports that he quit smoking about 39 years ago. His smoking use included cigarettes. He has never used smokeless tobacco. He reports that he does not currently use alcohol after a past usage of about 10.0 standard drinks of alcohol per week. He reports that he does not use drugs.  Allergies  Allergen Reactions   Atorvastatin Other (See Comments)    Made the hands ACHE   Cymbalta [Duloxetine Hcl] Nausea Only and Other (See Comments)    Drowsiness, also   Penicillins Hives    Family History  Problem Relation Age of Onset   Brain cancer Mother    Lung cancer Father    Heart disease Brother  No details   Dementia Brother    Neuropathy Neg Hx    Colon cancer Neg Hx    Colon polyps Neg Hx    Esophageal cancer Neg Hx    Rectal cancer Neg Hx    Stomach cancer Neg Hx    Sleep apnea Neg Hx      Prior to Admission medications   Medication Sig Start Date End Date Taking? Authorizing Provider  acyclovir (ZOVIRAX) 400 MG tablet Take 1 tablet (400 mg total) by mouth daily. 06/09/23  Yes Gorsuch, Ni, MD  albuterol (VENTOLIN HFA) 108 (90 Base) MCG/ACT inhaler Inhale 1-2 puffs into the lungs every 6 (six) hours as needed for wheezing  or shortness of breath.   Yes [provider]  apixaban (ELIQUIS) 5 MG TABS tablet Take 1 tablet (5 mg total) by mouth 2 (two) times daily. 08/20/23  Yes Alwyn Ren, MD  brimonidine [facility]) 0.2 % ophthalmic solution Place 1 drop into the left eye in the morning and at bedtime. 01/08/20  Yes [provider]  calcitonin, salmon, (MIACALCIN/FORTICAL) 200 UNIT/ACT nasal spray Place 1 spray into alternate nostrils daily.   Yes [provider]  cetirizine (ZYRTEC) 10 MG tablet Take 10 mg by mouth every morning.   Yes [provider]  cyclobenzaprine (FLEXERIL) 10 MG tablet Take 10 mg by mouth daily as needed for muscle spasms.   Yes [provider]  DARZALEX FASPRO 1800-30000 MG-UT/15ML SOLN Inject 1,800 mg into the skin once a week. Every friday 08/04/23  Yes [provider]  DENTA 5000 PLUS 1.1 % CREA dental cream Place 1 Application onto teeth at bedtime. 06/08/23  Yes [provider]  dexamethasone (DECADRON) 4 MG tablet Take 1 pill daily except 4 pills every Friday. Patient taking differently: Take 8-16 mg by mouth See admin instructions. Take 8mg  (2 tablets) by mouth daily except on Friday's. On Friday's, take 16mg  (4 tablets). 08/25/23  Yes Gorsuch, Ni, MD  docusate sodium (COLACE) 100 MG capsule Take 300 mg by mouth daily.   Yes [provider]  dorzolamide-timolol (COSOPT) 22.3-6.8 MG/ML ophthalmic solution Place 1 drop into both eyes 2 (two) times daily. 03/09/18  Yes [provider]  furosemide (LASIX) 40 MG tablet Take 1 tablet (40 mg) in the morning and 1/2 tablet (20 mg) in the afternoon 08/25/23  Yes Gorsuch, Ni, MD  ketoconazole (NIZORAL) 2 % cream Apply 1 application  topically daily as needed for irritation. 11/28/14  Yes [provider]  latanoprost (XALATAN) 0.005 % ophthalmic solution Place 1 drop into both eyes at bedtime. 03/01/13  Yes [provider]  lenalidomide (REVLIMID) 10 MG  capsule Take 1 capsule by mouth once daily for 14 days on, 7 days off of 21 day cycle 08/25/23  Yes Gorsuch, Ni, MD  lidocaine (XYLOCAINE) 5 % ointment Apply 1 Application topically 2 (two) times daily as needed. 09/29/22  Yes Butch Penny, NP  morphine (MS CONTIN) 15 MG 12 hr tablet Take 1 tablet (15 mg total) by mouth at bedtime. 08/25/23  Yes [name], MD  Multiple Vitamins-Minerals (CENTRUM SILVER 50+MEN) TABS Take 1 tablet by mouth daily with breakfast.   Yes [provider]  nitroGLYCERIN (NITROSTAT) 0.4 MG SL tablet Place 1 tablet (0.4 mg total) under the tongue every 5 (five) minutes as needed. 10/22/21  Yes Arty Baumgartner, NP  ondansetron (ZOFRAN) 8 MG tablet Take 1 tablet (8 mg total) by mouth every 8 (eight) hours as needed for nausea  or vomiting. 05/11/23  Yes Gorsuch, Ni, MD  Oxycodone HCl 10 MG TABS Take 1 tablet (10 mg total) by mouth every 4 (four) hours. 08/24/23  Yes [name], MD  pantoprazole (PROTONIX) 40 MG tablet Take 40 mg by mouth daily before breakfast. 03/14/13  Yes [provider]  polyethylene glycol (MIRALAX / GLYCOLAX) 17 g packet Take 17 g by mouth 2 (two) times daily. 07/30/23  Yes Gorsuch, Ni, MD  pregabalin (LYRICA) 100 MG capsule TAKE 1 CAPSULE BY MOUTH IN THE MORNING, AND AT NOON, AND 2 CAPSULES AT NIGHT Patient taking differently: Take 100-200 mg by mouth See admin instructions. Take 100mg  (1 capsule) by mouth every morning and 200mg  (2 capsules) every night. 02/17/23  Yes Ocie Doyne, MD  prochlorperazine (COMPAZINE) 10 MG tablet Take 1 tablet (10 mg total) by mouth every 6 (six) hours as needed for nausea or vomiting. 05/11/23  Yes Gorsuch, Ni, MD  rosuvastatin (CRESTOR) 40 MG tablet Take 40 mg by mouth in the morning and at bedtime.   Yes [provider]  senna-docusate (SENOKOT-S) 8.6-50 MG tablet Take 2 tablets by mouth 2 (two) times daily. Patient taking differently: Take 2 tablets by mouth 2 (two) times daily as needed for  moderate constipation. 07/30/23  Yes Gorsuch, Ni, MD  SSD 1 % cream Apply 1 Application topically daily. 08/09/23  Yes [provider]  triamcinolone (NASACORT) 55 MCG/ACT AERO nasal inhaler Place 2 sprays into the nose daily. Patient taking differently: Place 1 spray into the nose daily as needed. 06/03/23  Yes Gorsuch, Ni, MD  TYLENOL 500 MG tablet Take 1,000 mg by mouth every 6 (six) hours as needed for mild pain (pain score 1-3) or headache.   Yes [provider]    Physical Exam: BP (!) 163/76 (BP Location: Left Arm)   Pulse 69   Temp 97.7 F (36.5 C) (Oral)   Resp 20   SpO2 96%  General: Currently sleepy after receiving IV Dilaudid, his daughter is at the bedside.  They state that he has been confused lately and somewhat sleepy.  Also has mainly been complaining of a lot of back pain.  Overall he does not appear to be in acute distress, he appears slightly dehydrated. Cardiovascular: RRR, no murmurs or rubs, no peripheral edema  Respiratory: clear to auscultation bilaterally, no wheezes, no crackles  Abdomen: soft, nontender, nondistended, normal bowel tones heard  Skin: dry, no rashes  Musculoskeletal: no joint effusions, normal range of motion  Psychiatric: appropriate affect, normal speech  Neurologic: extraocular muscles intact, clear speech, moving all extremities with intact sensorium         Labs on Admission:  Basic Metabolic Panel: Recent Labs  Lab 08/25/23 0911 08/27/23 0937 08/29/23 1336 08/29/23 1352 08/30/23 0841  NA 141 136 138 140 140  K 4.1 3.3* 3.1* 3.2* 3.1*  CL 103 96* 100 101 100  CO2 31 31 27   --  31  GLUCOSE 113* 109* 139* 144* 114*  BUN 39* 42* 47* 40* 44*  CREATININE 1.09 1.25* 1.16 1.30* 1.15  CALCIUM 12.0* 12.9* 12.7*  --  13.0*  MG  --   --  1.7  --  1.4*   Liver Function Tests: Recent Labs  Lab 08/25/23 0911 08/27/23 0937 08/29/23 1336 08/30/23 0841  AST 11* 12* 13* 9*  ALT 8 9 11 8   ALKPHOS 81 78 66 68  BILITOT 0.3  0.4 0.4 0.4  PROT 9.0* 9.8* 9.1* 9.7*  ALBUMIN 3.2* 3.3*  2.6* 3.2*   No results for input(s): "LIPASE", "AMYLASE" in the last 168 hours. No results for input(s): "AMMONIA" in the last 168 hours. CBC: Recent Labs  Lab 08/25/23 0911 08/27/23 0937 08/29/23 1336 08/29/23 1352 08/30/23 0841  WBC 9.6 13.1* 9.2  --  8.7  NEUTROABS 7.6 10.8* 7.9*  --  6.7  HGB 9.3* 9.7* 9.1* 9.5* 9.4*  HCT 30.6* 30.4* 29.6* 28.0* 30.7*  MCV 89.5 85.2 88.9  --  88.5  PLT 272 311 260  --  289   Cardiac Enzymes: No results for input(s): "CKTOTAL", "CKMB", "CKMBINDEX", "TROPONINI" in the last 168 hours. BNP (last 3 results) Recent Labs    07/23/23 1016 07/25/23 1133 08/17/23 1219  BNP 116.3* 210.9* 265.3*    ProBNP (last 3 results) No results for input(s): "PROBNP" in the last 8760 hours.  CBG: Recent Labs  Lab 08/29/23 1322  GLUCAP 130*    Radiological Exams on Admission: CT Head Wo Contrast Result Date: 08/29/2023 CLINICAL DATA:  Increasing confusion, hypercalcemia, multiple myeloma EXAM: CT HEAD WITHOUT CONTRAST TECHNIQUE: Contiguous axial images were obtained from the base of the skull through the vertex without intravenous contrast. RADIATION DOSE REDUCTION: This exam was performed according to the departmental dose-optimization program which includes automated exposure control, adjustment of the mA and/or kV according to patient size and/or use of iterative reconstruction technique. COMPARISON:  10/16/2013, 04/22/2023 FINDINGS: Brain: No acute infarct or hemorrhage. Lateral ventricles and midline structures are unremarkable. No acute extra-axial fluid collections. No mass effect. Vascular: Atherosclerosis of the internal carotid arteries. No hyperdense vessel. Skull: Multiple new rounded lytic lesions are seen throughout the calvarium since the prior PET scan, consistent with progression of known multiple myeloma. There are no acute displaced fractures. Sinuses/Orbits: Left mastoid effusion. The  paranasal sinuses are clear. Other: None. IMPRESSION: 1. No acute intracranial process. 2. Lytic lesions seen throughout the calvarium, increased since prior exam, consistent with progression of known multiple myeloma. 3. Left mastoid effusion. Electronically Signed   By: Sharlet Salina M.D.   On: 08/29/2023 15:41   DG Chest Port 1 View Result Date: 08/29/2023 CLINICAL DATA:  Altered mental status.  Chest pain. EXAM: PORTABLE CHEST 1 VIEW COMPARISON:  August 16, 2023. FINDINGS: Stable cardiomediastinal silhouette. Mild bibasilar subsegmental atelectasis is noted with possible minimal pleural effusions. Bilateral rib fractures are again noted. IMPRESSION: Mild bibasilar subsegmental atelectasis with possible minimal pleural effusions. Electronically Signed   By: [name] M.D.   On: 08/29/2023 13:47   Assessment/Plan  Daniel Whitaker is a 78 y.o. adult with medical history significant for multiple myeloma with bony involvement being admitted to the hospital with symptomatic persistent hypercalcemia.   Symptomatic hypercalcemia-due to his known history of multiple myeloma with bony metastases.  Fortunately he has lytic lesions in the calvarium which have increased since his last imaging.  He is now symptomatic with mild confusion, spasms and lethargy. -Inpatient admission -Aggressive fluid resuscitation with normal saline at 200 cc/h -After several hours of hydration, will give a dose of Lasix 40 mg IV x 1 -Received Zometa at the end of February, Dr. Bertis Ruddy prefers to hold off on next dose of Zometa until next week if possible, due to recent dental work -Will give a dose of IV calcitonin now -Monitor BMP and calcium level at least twice daily -Nephrology consultation requested from Dr. Arlean Hopping  Hypokalemia-will replete orally, recheck with BMP this afternoon  Atrial fibrillation-continue Eliquis  Chronic pain-due to known bony involvement of multiple  myeloma -MS Contin at bedtime -Oral  oxycodone every 4 hours as needed -IV Dilaudid for breakthrough pain -Continue stool regimen -Continue home Lyrica dose    Code Status: Full Code  Consults called: Nephrology Dr. Arlean Hopping  Admission status: The appropriate patient status for this patient is INPATIENT. Inpatient status is judged to be reasonable and necessary in order to provide the required intensity of service to ensure the patient's safety. The patient's presenting symptoms, physical exam findings, and initial radiographic and laboratory data in the context of their chronic comorbidities is felt to place them at high risk for further clinical deterioration. Furthermore, it is not anticipated that the patient will be medically stable for discharge from the hospital within 2 midnights of admission.    I certify that at the point of admission it is my clinical judgment that the patient will require inpatient hospital care spanning beyond 2 midnights from the point of admission due to high intensity of service, high risk for further deterioration and high frequency of surveillance required  Time spent: 59 minutes  Korinne Greenstein Sharlette Dense MD [facility] Pager [phone]  If 7PM-7AM, please contact night-coverage [email or web address] Password [facility]  08/30/2023, 12:33 PM

## 2023-08-30 NOTE — Consult Note (Signed)
 Renal Service Consult Note [facility]  KAMDYN COLBORN 08/30/2023 Maree Krabbe, MD Requesting Physician: Dr. Kirby Crigler  Reason for Consult: Hypercalcemia  HPI: The patient is a 78 y.o. year-old w/ PMH of multiple myeloma w/ associated hypercalcemia in the past (dec 2024 admission) presenting w/ confusion, lethargy and hypercalcemia. Pt is managed closely by the oncology team and has been getting IVF's in the clinic, nasal calcitonin as needed and po lasix. He got zometa back in January but recently had some dental work so it has been on hold for now til next week if possible. Lab work today showed Ca++ was up to 13 so pt was admitted. We are asked to see for hypercalcemia.    Pt seen in room. Just got IV pain meds, not able to stay awake. Got hx from the pt's son, he is on decadron, revlimid and 2 injections. His Ca++ has been good since Dec 2024 admit, except when he had PNA and was admitted again and missed his OP chemoRx then he says the Ca++ bumped up somewhat. Also the son states he has issues w/ orthopnea over the last few months, "fluid retention".   Last seen by Dr Bertis Ruddy w/ oncology on 3/28. Has IgA kappa multiple myeloma w/ multiple bone fx's at presentation in 2024. BM bx showed 15% involvement. Started chemo combination. Chemo was interrupted for hospital stay in Feb. Repeat tests in Feb showed worsening M protein, IgA and LC's. He was to be referred to [facility] for 2nd opinion. Pt was felt to be dehydrated. Lasix dose was lowered.   Serum Ca++ was high at 13.7 on 12/01 and was dc'd on 12/06 w/ Ca++ 9.6. Ca++ picked up to 11.4 on 06/11/23, but then has been controlled under 10.5 until 08/25/23 w/ creat 12.0 (this admit).     PMH CAD Glaucoma HL Multiple myeloma RLS Anemia    ROS - denies CP, no joint pain, no HA, no blurry vision, no rash, no diarrhea, no nausea/ vomiting   Past Medical History  Past Medical History:  Diagnosis Date   Arthritis     Cataract    removed bilaterally    Coronary artery disease    Erectile dysfunction    Esophageal reflux    Glaucoma    Hypertriglyceridemia    Impaired fasting glucose    MGUS (monoclonal gammopathy of unknown significance)    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   Nocturnal leg cramps 03/11/2021   Nontoxic uninodular goiter    Obesity    Peripheral neuropathy    RLS (restless legs syndrome)    Spinal stenosis    Unspecified deficiency anemia 07/11/2013   Past Surgical History  Past Surgical History:  Procedure Laterality Date   CARDIAC CATHETERIZATION     COLON RESECTION N/A 04/21/2013   Procedure: DIAGNOSTIC LAPAROSCOPY, lysis of adhesions for partial small bowel obstruction;  Surgeon: Adolph Pollack, MD;  Location: [facility];  Service: General;  Laterality: N/A;   COLON SURGERY     partial SBO    COLONOSCOPY     CORONARY STENT INTERVENTION N/A 10/22/2021   Procedure: CORONARY STENT INTERVENTION;  Surgeon: Marykay Lex, MD;  Location: [facility] INVASIVE CV LAB;  Service: Cardiovascular;  Laterality: N/A;   KNEE SURGERY     Arthroscopic   LEFT HEART CATH AND CORONARY ANGIOGRAPHY N/A 10/22/2021   Procedure: LEFT HEART CATH AND CORONARY ANGIOGRAPHY;  Surgeon: Marykay Lex, MD;  Location: [facility] INVASIVE CV LAB;  Service: Cardiovascular;  Laterality: N/A;   SMALL INTESTINE SURGERY     Blockage   Family History  Family History  Problem Relation Age of Onset   Brain cancer Mother    Lung cancer Father    Heart disease Brother        No details   Dementia Brother    Neuropathy Neg Hx    Colon cancer Neg Hx    Colon polyps Neg Hx    Esophageal cancer Neg Hx    Rectal cancer Neg Hx    Stomach cancer Neg Hx    Sleep apnea Neg Hx    Social History  reports that he quit smoking about 39 years ago. His smoking use included cigarettes. He has never used smokeless tobacco. He reports that he does not currently use alcohol after a past usage of about 10.0 standard  drinks of alcohol per week. He reports that he does not use drugs. Allergies  Allergies  Allergen Reactions   Atorvastatin Other (See Comments)    Made the hands ACHE   Cymbalta [Duloxetine Hcl] Nausea Only and Other (See Comments)    Drowsiness, also   Penicillins Hives   Home medications Prior to Admission medications   Medication Sig Start Date End Date Taking? Authorizing Provider  acyclovir (ZOVIRAX) 400 MG tablet Take 1 tablet (400 mg total) by mouth daily. 06/09/23  Yes Gorsuch, Ni, MD  albuterol (VENTOLIN HFA) 108 (90 Base) MCG/ACT inhaler Inhale 1-2 puffs into the lungs every 6 (six) hours as needed for wheezing or shortness of breath.   Yes [provider]  apixaban (ELIQUIS) 5 MG TABS tablet Take 1 tablet (5 mg total) by mouth 2 (two) times daily. 08/20/23  Yes Alwyn Ren, MD  brimonidine [facility]) 0.2 % ophthalmic solution Place 1 drop into the left eye in the morning and at bedtime. 01/08/20  Yes [provider]  calcitonin, salmon, (MIACALCIN/FORTICAL) 200 UNIT/ACT nasal spray Place 1 spray into alternate nostrils daily.   Yes [provider]  cetirizine (ZYRTEC) 10 MG tablet Take 10 mg by mouth every morning.   Yes [provider]  cyclobenzaprine (FLEXERIL) 10 MG tablet Take 10 mg by mouth daily as needed for muscle spasms.   Yes [provider]  DARZALEX FASPRO 1800-30000 MG-UT/15ML SOLN Inject 1,800 mg into the skin once a week. Every friday 08/04/23  Yes [provider]  DENTA 5000 PLUS 1.1 % CREA dental cream Place 1 Application onto teeth at bedtime. 06/08/23  Yes [provider]  dexamethasone (DECADRON) 4 MG tablet Take 1 pill daily except 4 pills every Friday. Patient taking differently: Take 8-16 mg by mouth See admin instructions. Take 8mg  (2 tablets) by mouth daily except on Friday's. On Friday's, take 16mg  (4 tablets). 08/25/23  Yes Gorsuch, Ni, MD  docusate sodium (COLACE) 100 MG capsule Take 300 mg  by mouth daily.   Yes [provider]  dorzolamide-timolol (COSOPT) 22.3-6.8 MG/ML ophthalmic solution Place 1 drop into both eyes 2 (two) times daily. 03/09/18  Yes [provider]  furosemide (LASIX) 40 MG tablet Take 1 tablet (40 mg) in the morning and 1/2 tablet (20 mg) in the afternoon 08/25/23  Yes Gorsuch, Ni, MD  ketoconazole (NIZORAL) 2 % cream Apply 1 application  topically daily as needed for irritation. 11/28/14  Yes [provider]  latanoprost (XALATAN) 0.005 % ophthalmic solution Place 1 drop into both eyes at bedtime. 03/01/13  Yes [provider]  lenalidomide (REVLIMID) 10 MG capsule  Take 1 capsule by mouth once daily for 14 days on, 7 days off of 21 day cycle 08/25/23  Yes Gorsuch, Ni, MD  lidocaine (XYLOCAINE) 5 % ointment Apply 1 Application topically 2 (two) times daily as needed. 09/29/22  Yes Butch Penny, NP  morphine (MS CONTIN) 15 MG 12 hr tablet Take 1 tablet (15 mg total) by mouth at bedtime. 08/25/23  Yes [name], MD  Multiple Vitamins-Minerals (CENTRUM SILVER 50+MEN) TABS Take 1 tablet by mouth daily with breakfast.   Yes [provider]  nitroGLYCERIN (NITROSTAT) 0.4 MG SL tablet Place 1 tablet (0.4 mg total) under the tongue every 5 (five) minutes as needed. 10/22/21  Yes Arty Baumgartner, NP  ondansetron (ZOFRAN) 8 MG tablet Take 1 tablet (8 mg total) by mouth every 8 (eight) hours as needed for nausea or vomiting. 05/11/23  Yes Bertis Ruddy, Ni, MD  Oxycodone HCl 10 MG TABS Take 1 tablet (10 mg total) by mouth every 4 (four) hours. 08/24/23  Yes [name], MD  pantoprazole (PROTONIX) 40 MG tablet Take 40 mg by mouth daily before breakfast. 03/14/13  Yes [provider]  polyethylene glycol (MIRALAX / GLYCOLAX) 17 g packet Take 17 g by mouth 2 (two) times daily. 07/30/23  Yes Gorsuch, Ni, MD  pregabalin (LYRICA) 100 MG capsule TAKE 1 CAPSULE BY MOUTH IN THE MORNING, AND AT NOON, AND 2 CAPSULES AT NIGHT Patient taking  differently: Take 100-200 mg by mouth See admin instructions. Take 100mg  (1 capsule) by mouth every morning and 200mg  (2 capsules) every night. 02/17/23  Yes Ocie Doyne, MD  prochlorperazine (COMPAZINE) 10 MG tablet Take 1 tablet (10 mg total) by mouth every 6 (six) hours as needed for nausea or vomiting. 05/11/23  Yes Gorsuch, Ni, MD  rosuvastatin (CRESTOR) 40 MG tablet Take 40 mg by mouth in the morning and at bedtime.   Yes [provider]  senna-docusate (SENOKOT-S) 8.6-50 MG tablet Take 2 tablets by mouth 2 (two) times daily. Patient taking differently: Take 2 tablets by mouth 2 (two) times daily as needed for moderate constipation. 07/30/23  Yes Gorsuch, Ni, MD  SSD 1 % cream Apply 1 Application topically daily. 08/09/23  Yes [provider]  triamcinolone (NASACORT) 55 MCG/ACT AERO nasal inhaler Place 2 sprays into the nose daily. Patient taking differently: Place 1 spray into the nose daily as needed. 06/03/23  Yes Gorsuch, Ni, MD  TYLENOL 500 MG tablet Take 1,000 mg by mouth every 6 (six) hours as needed for mild pain (pain score 1-3) or headache.   Yes [provider]     Vitals:   08/30/23 1139 08/30/23 1424  BP: (!) 163/76 (!) 145/66  Pulse: 69 (!) 59  Resp: 20   Temp: 97.7 F (36.5 C) 98.1 F (36.7 C)  TempSrc: Oral Oral  SpO2: 96% 95%   Exam Gen alert, no distress, [place] O2 in place, pt sleeping  No rash, cyanosis or gangrene Sclera anicteric, throat clear  No jvd or bruits Chest clear bilat to bases, no rales/ wheezing RRR no MRG Abd soft ntnd no mass or ascites +bs GU nl male, condom cath draining mostly clear urine in large amts MS no joint effusions or deformity Ext no pitting LE or UE edema, no other edema Neuro as above      Renal-related home meds: Calcitonin 200 unit 1 spray daily Decadron as directed Lasix 40mg  qam and 20mg  qpm Revlimid 10mg  as directed MS contin Oxy IR PPI Lyrica  CXR 3/30 - IMPRESSION: Mild bibasilar  subsegmental atelectasis with possible minimal pleural effusions.  Na 140 K 3.1  CO2 31  BUN 44  Creat 1.15   Ca 13.0  Alb 3.2  tbili     Assessment/ Plan: Hypercalcemia - recurrent issue for this pt w/ multiple myeloma. Looks euvolemic on exam today. Renal function is good. Is getting NS at 200 cc/hr and rec'd IV lasix 40 mg x 1. Will cont w/ IV lasix 40mg  bid. Calcitonin SQ every 12 hrs x 4 already ordered. Will follow. Oncology will decide on bisphosphonates, not an issue today. Will follow.  Multiple myeloma - per Etheleen Mayhew  MD [facility] 08/30/2023, 4:27 PM  Recent Labs  Lab 08/29/23 1336 08/29/23 1352 08/30/23 0841  HGB 9.1* 9.5* 9.4*  ALBUMIN 2.6*  --  3.2*  CALCIUM 12.7*  --  13.0*  CREATININE 1.16 1.30* 1.15  K 3.1* 3.2* 3.1*   Inpatient medications:  [START ON 08/31/2023] acyclovir  400 mg Oral Daily   apixaban  5 mg Oral BID   calcitonin  380 Units Subcutaneous BID   docusate sodium  300 mg Oral Daily   [START ON 08/31/2023] loratadine  10 mg Oral Daily   morphine  15 mg Oral QHS   [START ON 08/31/2023] pantoprazole  40 mg Oral QAC breakfast   polyethylene glycol  17 g Oral BID   potassium chloride  40 mEq Oral Once   [START ON 08/31/2023] pregabalin  100 mg Oral Daily   pregabalin  200 mg Oral QHS    sodium chloride 200 mL/hr at 08/30/23 1200   acetaminophen **OR** acetaminophen, albuterol, cyclobenzaprine, HYDROmorphone (DILAUDID) injection, ondansetron **OR** ondansetron (ZOFRAN) IV, oxyCODONE, traZODone

## 2023-08-31 ENCOUNTER — Inpatient Hospital Stay: Admitting: Hematology and Oncology

## 2023-08-31 ENCOUNTER — Other Ambulatory Visit: Payer: Self-pay | Admitting: Hematology and Oncology

## 2023-08-31 ENCOUNTER — Telehealth: Payer: Self-pay

## 2023-08-31 ENCOUNTER — Encounter: Payer: Self-pay | Admitting: Hematology and Oncology

## 2023-08-31 DIAGNOSIS — C9000 Multiple myeloma not having achieved remission: Secondary | ICD-10-CM

## 2023-08-31 LAB — CALCIUM
Calcium: 12 mg/dL — ABNORMAL HIGH (ref 8.9–10.3)
Calcium: 11.7 mg/dL — ABNORMAL HIGH (ref 8.9–10.3)

## 2023-08-31 LAB — BASIC METABOLIC PANEL WITH GFR
Anion gap: 13 (ref 5–15)
BUN: 40 mg/dL — ABNORMAL HIGH (ref 8–23)
CO2: 30 mmol/L (ref 22–32)
Calcium: 11.8 mg/dL — ABNORMAL HIGH (ref 8.9–10.3)
Chloride: 99 mmol/L (ref 98–111)
Creatinine, Ser: 1.07 mg/dL (ref 0.61–1.24)
GFR, Estimated: >60 mL/min (ref >60)
Glucose, Bld: 90 mg/dL (ref 70–99)
Potassium: 2.6 mmol/L — CL (ref 3.5–5.1)
Sodium: 142 mmol/L (ref 135–145)

## 2023-08-31 LAB — CBC
HCT: 28.3 % — ABNORMAL LOW (ref 39.0–52.0)
Hemoglobin: 8.7 g/dL — ABNORMAL LOW (ref 13.0–17.0)
MCH: 27.4 pg (ref 26.0–34.0)
MCHC: 30.7 g/dL (ref 30.0–36.0)
MCV: 89.3 fL (ref 80.0–100.0)
Platelets: 255 10*3/uL (ref 150–400)
RBC: 3.17 MIL/uL — ABNORMAL LOW (ref 4.22–5.81)
RDW: 17.2 % — ABNORMAL HIGH (ref 11.5–15.5)
WBC: 7.5 10*3/uL (ref 4.0–10.5)
nRBC: 0 % (ref 0.0–0.2)

## 2023-08-31 LAB — ALBUMIN: Albumin: 2.6 g/dL — ABNORMAL LOW (ref 3.5–5.0)

## 2023-08-31 MED ORDER — DEXAMETHASONE 4 MG PO TABS
8.0000 mg | ORAL_TABLET | Freq: Every day | ORAL | Status: DC
  Administered 2023-08-31 – 2023-09-01 (×2): 8 mg via ORAL
  Filled 2023-08-31 (×2): qty 2

## 2023-08-31 MED ORDER — POTASSIUM CHLORIDE 20 MEQ PO PACK: 40.0000 meq | PACK | Freq: Once | ORAL | Status: DC

## 2023-08-31 MED ORDER — SODIUM CHLORIDE 0.9 % IV SOLN
4.0000 mg | Freq: Once | INTRAVENOUS | Status: DC
  Filled 2023-08-31: qty 5

## 2023-08-31 MED ORDER — FUROSEMIDE 10 MG/ML IJ SOLN
20.0000 mg | Freq: Two times a day (BID) | INTRAMUSCULAR | Status: DC
  Administered 2023-08-31 – 2023-09-03 (×6): 20 mg via INTRAVENOUS
  Filled 2023-08-31 (×6): qty 2

## 2023-08-31 MED ORDER — ZOLEDRONIC ACID 4 MG/100ML IV SOLN
4.0000 mg | Freq: Once | INTRAVENOUS | Status: DC
  Filled 2023-08-31: qty 100

## 2023-08-31 MED ORDER — ZOLEDRONIC ACID 4 MG/100ML IV SOLN: 4.0000 mg | Freq: Once | INTRAVENOUS | Status: DC

## 2023-08-31 MED ORDER — POTASSIUM CHLORIDE 10 MEQ/100ML IV SOLN
10.0000 meq | INTRAVENOUS | Status: AC
  Administered 2023-08-31 (×4): 10 meq via INTRAVENOUS
  Filled 2023-08-31 (×3): qty 100

## 2023-08-31 MED ORDER — ZOLEDRONIC ACID 4 MG/100ML IV SOLN
4.0000 mg | Freq: Once | INTRAVENOUS | Status: AC
  Administered 2023-08-31: 4 mg via INTRAVENOUS
  Filled 2023-08-31: qty 100

## 2023-08-31 MED ORDER — SODIUM CHLORIDE 0.45 % IV SOLN: INTRAVENOUS | Status: DC

## 2023-08-31 MED ORDER — POTASSIUM CHLORIDE CRYS ER 20 MEQ PO TBCR
40.0000 meq | EXTENDED_RELEASE_TABLET | Freq: Three times a day (TID) | ORAL | Status: AC
  Administered 2023-08-31 (×2): 40 meq via ORAL
  Filled 2023-08-31 (×2): qty 2

## 2023-08-31 NOTE — Progress Notes (Signed)
 DISCONTINUE ON PATHWAY REGIMEN - Multiple Myeloma and Other Plasma Cell Dyscrasias   DaraVRd (Daratumumab SUBQ + Bortezomib SUBQ D1,4,8,11 + Lenalidomide PO D1-14 + Dexamethasone 20 mg IV/PO D1,2,8,9,15,16) q21 Days (Induction Schema):   A cycle is every 21 days:     Lenalidomide      Dexamethasone      Bortezomib      Daratumumab and hyaluronidase-fihj    DaraVRd (Daratumumab SUBQ + Bortezomib SUBQ D1,4,8,11 + Lenalidomide PO D1-14 + Dexamethasone 20 mg IV/PO D1,2,8,9,15,16) q21 Days (Consolidation Schema):   A cycle is every 21 days:     Lenalidomide      Dexamethasone      Bortezomib      Daratumumab and hyaluronidase-fihj   **Always confirm dose/schedule in your pharmacy ordering system**  PRIOR TREATMENT: [id number]: DaraVRd - Subcutaneous Daratumumab (Daratumumab/hyaluronidase SUBQ + Bortezomib 1.3 mg/m2 SUBQ D1, 4, 8, 11 + Lenalidomide 25 mg PO + Dexamethasone 20 mg PO/IV) q21 Days Concurrent with Referral to Transplant Service  START ON PATHWAY REGIMEN - Multiple Myeloma and Other Plasma Cell Dyscrasias     Cycle 1: A cycle is 28 days:     Cyclophosphamide      Dexamethasone      Carfilzomib      Carfilzomib    Cycles 2 through 6: A cycle is every 28 days:     Cyclophosphamide      Dexamethasone      Carfilzomib   **Always confirm dose/schedule in your pharmacy ordering system**  Patient Characteristics: Multiple Myeloma, Relapsed / Refractory, Second through Fourth Lines of Therapy, Not a Candidate for CAR T-cell Therapy, Fit or Candidate for Triplet Therapy, Lenalidomide-Refractory or Lenalidomide-based Regimen Not Preferred, Not a Candidate for  Anti-CD38 Antibody Disease Classification: Multiple Myeloma Therapeutic Status: Refractory R2-ISS Staging: III Line of Therapy: Second Line Anti-CD38 Antibody Candidacy: Not a Candidate for Anti-CD38 Antibody Lenalidomide-based Regimen Preference/Candidacy: Lenalidomide-Refractory Intent of Therapy: Non-Curative /  Palliative Intent, Discussed with Patient

## 2023-08-31 NOTE — Progress Notes (Signed)
 NASER SCHULD   DOB:June 24, 1945   ZO#:[id number]    ASSESSMENT & PLAN:   Multiple myeloma not having achieved remission (HCC) He has IgA kappa multiple myeloma with multiple bone fractures at presentation in 2024.  Bone marrow biopsy showed 15% involvement, standard risk myeloma FISH and cytogenetics He had initial good partial response with combination chemotherapy with daratumumab, lenalidomide, bortezomib and dexamethasone Since recurrent hospitalization in February and March and interruption of treatment, he is now showing signs of disease progression with elevated serum total protein and reduce albumin Will repeat myeloma panel today to confirm  Overall, he has very very poor prognosis as he is progressing through first line of quadruple chemotherapy Patient is interested to pursue more palliative treatment but his calcium level needs to be stabilized first I will return tomorrow to discuss change of treatment to carfilzomib, Cytoxan and dexamethasone He will continue aspirin for DVT prophylaxis He will continue vitamin D supplement He is on acyclovir for antimicrobial prophylaxis  Deficiency anemia Multifactorial, due to myeloma, recent treatment and hemodilution This is stable Monitor closely  Hypercalcemia He has recurrent malignant hypercalcemia again He has failed outpatient therapy and is now being admitted His total calcium level is down but likely due to hemodilution Will continue corticosteroids, calcitonin, furosemide, hydration and to start him on Zometa He has completed dental extraction and his gum is healing  Cancer related pain He has recent exacerbation of bone pain due to steroid withdrawal Pain control is improved reintroduction of dexamethasone and MS Contin at night He will continue oxycodone as needed along with acetaminophen He appears somewhat sedated likely due to side effects of hypercalcemia  Altered mental status He is noted to have slow mentation  likely due to hypercalcemia Multiple family members are by the bedside and agree with the plan of care  Discharge planning He would likely be here for next 2 to 3 days   [name], MD 08/31/2023 8:59 AM  Subjective:  Patient well-known to me.  He has been seen weekly since his diagnosis of myeloma due to aggressive nature of his disease Last week, I attempted to control his hypercalcemia with outpatient management but due to worsening hypercalcemia and altered mental status, he is being admitted for management His wife and daughter are by the bedside The patient was able to answer questions but noticed delay in response He complained of pain and shortness of breath I reviewed his labs and we discussed plan of care as above   Objective:  Vitals:   08/31/23 0339 08/31/23 0620  BP:  135/62  Pulse:  70  Resp:  20  Temp:  97.9 F (36.6 C)  SpO2: 91% 95%     Intake/Output Summary (Last 24 hours) at 08/31/2023 0859 Last data filed at 08/31/2023 0500 Gross per 24 hour  Intake 1688.67 ml  Output 5325 ml  Net -3636.33 ml

## 2023-08-31 NOTE — Progress Notes (Signed)
 [facility] Progress Note  Subjective:    Vitals:   08/31/23 0322 08/31/23 0339 08/31/23 0620 08/31/23 1417  BP: (!) 135/57  135/62 (!) 142/83  Pulse: 71  70 79  Resp: 20  20   Temp: (!) 97.5 F (36.4 C)  97.9 F (36.6 C) 98.3 F (36.8 C)  TempSrc: Oral  Oral Oral  SpO2: 92% 91% 95%   Weight:      Height:        Exam: Gen alert, [place] O2 in place pt awakens and responds appropriately Sclera anicteric, throat a bit dry No jvd or bruits Chest clear bilat to bases RRR no MRG Abd soft ntnd no mass or ascites +bs GU nl male, condom cath draining mostly clear urine in large amts Ext no LE edema Neuro as above          Renal-related home meds: Calcitonin 200 unit 1 spray daily Decadron as directed Lasix 40mg  qam and 20mg  qpm Revlimid 10mg  as directed MS contin Oxy IR PPI Lyrica    CXR 3/30 - IMPRESSION: Mild bibasilar subsegmental atelectasis with possible minimal pleural effusions.   Assessment/ Plan: Hypercalcemia: Ca++ in the 13s here. Recurrent issue for this pt w/ multiple myeloma. Renal function is good. Was getting NS at 200 cc/hr and IV lasix 40 bid overnight, also calcitonin SQ every 12 hrs x 4. Looks a bit dry today and UOP > intake - will cont IVF"s, lower to 150 cc/hr and will lower IV lasix to 20mg  q bid. Getting zoledronic acid per ONC. Will follow.  Hypernatremia: will change IVF"s to 1/2 NS due to rising Na+ levels Multiple myeloma: per Etheleen Mayhew MD  [facility] 08/31/2023, 2:27 PM  Recent Labs  Lab 08/30/23 0841 08/30/23 1900 08/31/23 0632 08/31/23 1252  HGB 9.4*  --  8.7*  --   ALBUMIN 3.2*  --  2.6*  --   CALCIUM 13.0* 11.9* 11.8* 12.0*  CREATININE 1.15 1.21 1.07  --   K 3.1* 2.9* 2.6*  --    No results for input(s): "IRON", "TIBC", "FERRITIN" in the last 168 hours. Inpatient medications:  acyclovir  400 mg Oral Daily   apixaban  5 mg Oral BID   dexamethasone  8 mg Oral Daily   docusate sodium  300 mg Oral Daily    furosemide  20 mg Intravenous Q12H   loratadine  10 mg Oral Daily   morphine  15 mg Oral QHS   pantoprazole  40 mg Oral QAC breakfast   polyethylene glycol  17 g Oral BID   potassium chloride  40 mEq Oral TID   pregabalin  100 mg Oral Daily   pregabalin  200 mg Oral QHS    sodium chloride 150 mL/hr at 08/31/23 0902   potassium chloride 10 mEq (08/31/23 1417)   acetaminophen **OR** acetaminophen, albuterol, cyclobenzaprine, HYDROmorphone (DILAUDID) injection, ondansetron **OR** ondansetron (ZOFRAN) IV, mouth rinse, oxyCODONE, traZODone

## 2023-08-31 NOTE — Progress Notes (Signed)
 PROGRESS NOTE    ANTONIS LOR  ZOX:[id number] DOB: Jul 19, 1945 DOA: 08/30/2023 PCP: [name], MD   Brief Narrative:  This 78 yrs old Male with PMH significant for multiple myeloma with bony involvement undergoing active chemotherapy presented in the ED with confusion, lethargy and muscle spasms. He is found to have significant hypercalcemia.  He has been managed actively by the oncology service for his hypercalcemia, he has received IV fluids multiple times in the clinic, He was also given increasing doses of p.o. Lasix as well as oral Decadron for the last week.  Per family, he received Zometa back in January, but he had some recent dental work and so plan was to try to hold off on bisphosphonates until next week if possible. He was seen in the emergency department yesterday, He was given IV fluids, as well as calcitonin.  Family preferred  to take him home yesterday after he was feeling a little better, with plan to follow-up with oncology today.  Today, lab work shows persistent hypercalcemia with calcium level 13.  He was therefore directly admitted to the hospital from oncology clinic.   Assessment & Plan:   Principal Problem:   Hypercalcemia  Symptomatic hypercalcemia Malignant hypercalcemia: Patient has known history of multiple myeloma with bony metastasis. He has lytic lesions in the calvarium which have increased since his last imaging.   Patient has failed outpatient therapy for hypercalcemia. He is now symptomatic with mild confusion, spasms and lethargy. CT head no acute intracranial abnormality, Continue Aggressive fluid resuscitation with 1/2 NS @ 150 cc/hr Continue Lasix 40 mg every 12 hours. He has received Zometa at the end of February, He has received 3 doses of IV calcitonin. Nephrology is following. Monitor BMP and calcium level at least twice daily. Patient has been given a dose of Zometa. Continue Decadron 8 mg daily  Hypokalemia Replaced.  Continue to  monitor.   Atrial fibrillation:  Heart rate controlled. Continue Eliquis.   Chronic pain syndrome Due to known bony involvement of multiple myeloma Continue MS Contin at bedtime Continue oxycodone every 4 hours as needed Continue IV Dilaudid for breakthrough pain Continue stool regimen Continue home Lyrica dose    DVT prophylaxis: Eliquis Code Status: Full code Family Communication: Family is at bedside Disposition Plan:    Status is: Inpatient Remains inpatient appropriate because: Admitted for malignant hypercalcemia requiring IV hydration,  Zometa, calcitonin,  Lasix and other interventions.    Consultants:  Nephrology Oncology  Procedures:  Antimicrobials:  Anti-infectives (From admission, onward)    Start     Dose/Rate Route Frequency Ordered Stop   08/31/23 1000  acyclovir (ZOVIRAX) tablet 400 mg        400 mg Oral Daily 08/30/23 1149        Subjective: Patient was seen and examined at bedside. Overnight events noted. Patient was slightly confused,  sitting in the chair,  family is around, reports in a lot of pain.  Objective: Vitals:   08/31/23 0001 08/31/23 0322 08/31/23 0339 08/31/23 0620  BP: (!) 124/54 (!) 135/57  135/62  Pulse: 63 71  70  Resp: 20 20  20   Temp: 98 F (36.7 C) (!) 97.5 F (36.4 C)  97.9 F (36.6 C)  TempSrc:  Oral  Oral  SpO2: 94% 92% 91% 95%  Weight:      Height:        Intake/Output Summary (Last 24 hours) at 08/31/2023 1217 Last data filed at 08/31/2023 0945 Gross per 24 hour  Intake 1805.67 ml  Output 5325 ml  Net -3519.33 ml   Filed Weights   08/30/23 1900  Weight: 96.5 kg    Examination:  General exam: Appears calm and comfortable,  Morbidly obese, deconditioned. Respiratory system: Clear to auscultation. Respiratory effort normal. RR 16 Cardiovascular system: S1 & S2 heard, RRR. No JVD, murmurs, rubs, gallops or clicks.  Gastrointestinal system: Abdomen is non distended, soft and nontender.Normal bowel sounds  heard. Central nervous system: Alert and oriented X 1. No focal neurological deficits. Extremities: No edema, no cyanosis, no clubbing Skin: No rashes, lesions or ulcers Psychiatry: Judgement and insight appear normal. Mood & affect appropriate.     Data Reviewed: I have personally reviewed following labs and imaging studies  CBC: Recent Labs  Lab 08/25/23 0911 08/27/23 0937 08/29/23 1336 08/29/23 1352 08/30/23 0841 08/31/23 0632  WBC 9.6 13.1* 9.2  --  8.7 7.5  NEUTROABS 7.6 10.8* 7.9*  --  6.7  --   HGB 9.3* 9.7* 9.1* 9.5* 9.4* 8.7*  HCT 30.6* 30.4* 29.6* 28.0* 30.7* 28.3*  MCV 89.5 85.2 88.9  --  88.5 89.3  PLT 272 311 260  --  289 255   Basic Metabolic Panel: Recent Labs  Lab 08/27/23 0937 08/29/23 1336 08/29/23 1352 08/30/23 0841 08/30/23 1900 08/31/23 0632  NA 136 138 140 140 141 142  K 3.3* 3.1* 3.2* 3.1* 2.9* 2.6*  CL 96* 100 101 100 99 99  CO2 31 27  --  31 29 30   GLUCOSE 109* 139* 144* 114* 162* 90  BUN 42* 47* 40* 44* 46* 40*  CREATININE 1.25* 1.16 1.30* 1.15 1.21 1.07  CALCIUM 12.9* 12.7*  --  13.0* 11.9* 11.8*  MG  --  1.7  --  1.4*  --   --    GFR: Estimated Creatinine Clearance (by C-G formula based on SCr of 1.07 mg/dL) Male: 16.1 mL/min Male: 69.7 mL/min Liver Function Tests: Recent Labs  Lab 08/25/23 0911 08/27/23 0937 08/29/23 1336 08/30/23 0841 08/31/23 0632  AST 11* 12* 13* 9*  --   ALT 8 9 11 8   --   ALKPHOS 81 78 66 68  --   BILITOT 0.3 0.4 0.4 0.4  --   PROT 9.0* 9.8* 9.1* 9.7*  --   ALBUMIN 3.2* 3.3* 2.6* 3.2* 2.6*   No results for input(s): "LIPASE", "AMYLASE" in the last 168 hours. No results for input(s): "AMMONIA" in the last 168 hours. Coagulation Profile: No results for input(s): "INR", "PROTIME" in the last 168 hours. Cardiac Enzymes: No results for input(s): "CKTOTAL", "CKMB", "CKMBINDEX", "TROPONINI" in the last 168 hours. BNP (last 3 results) No results for input(s): "PROBNP" in the last 8760  hours. HbA1C: No results for input(s): "HGBA1C" in the last 72 hours. CBG: Recent Labs  Lab 08/29/23 1322  GLUCAP 130*   Lipid Profile: No results for input(s): "CHOL", "HDL", "LDLCALC", "TRIG", "CHOLHDL", "LDLDIRECT" in the last 72 hours. Thyroid Function Tests: No results for input(s): "TSH", "T4TOTAL", "FREET4", "T3FREE", "THYROIDAB" in the last 72 hours. Anemia Panel: No results for input(s): "VITAMINB12", "FOLATE", "FERRITIN", "TIBC", "IRON", "RETICCTPCT" in the last 72 hours. Sepsis Labs: No results for input(s): "PROCALCITON", "LATICACIDVEN" in the last 168 hours.  Recent Results (from the past 240 hours)  Resp panel by RT-PCR (RSV, Flu A&B, Covid) Anterior Nasal Swab     Status: None   Collection Time: 08/29/23  1:36 PM   Specimen: Anterior Nasal Swab  Result Value Ref Range Status   SARS Coronavirus  2 by RT PCR NEGATIVE NEGATIVE Final    Comment: (NOTE) SARS-CoV-2 target nucleic acids are NOT DETECTED.  The SARS-CoV-2 RNA is generally detectable in upper respiratory specimens during the acute phase of infection. The lowest concentration of SARS-CoV-2 viral copies this assay can detect is 138 copies/mL. A negative result does not preclude SARS-Cov-2 infection and should not be used as the sole basis for treatment or other patient management decisions. A negative result may occur with  improper specimen collection/handling, submission of specimen other than nasopharyngeal swab, presence of viral mutation(s) within the areas targeted by this assay, and inadequate number of viral copies(<138 copies/mL). A negative result must be combined with clinical observations, patient history, and epidemiological information. The expected result is Negative.  Fact Sheet for Patients:  BloggerCourse.com  Fact Sheet for Healthcare Providers:  SeriousBroker.it  This test is no t yet approved or cleared by the [place] FDA and   has been authorized for detection and/or diagnosis of SARS-CoV-2 by FDA under an Emergency Use Authorization (EUA). This EUA will remain  in effect (meaning this test can be used) for the duration of the COVID-19 declaration under Section 564(b)(1) of the Act, 21 U.S.C.section 360bbb-3(b)(1), unless the authorization is terminated  or revoked sooner.       Influenza A by PCR NEGATIVE NEGATIVE Final   Influenza B by PCR NEGATIVE NEGATIVE Final    Comment: (NOTE) The Xpert Xpress SARS-CoV-2/FLU/RSV plus assay is intended as an aid in the diagnosis of influenza from Nasopharyngeal swab specimens and should not be used as a sole basis for treatment. Nasal washings and aspirates are unacceptable for Xpert Xpress SARS-CoV-2/FLU/RSV testing.  Fact Sheet for Patients: BloggerCourse.com  Fact Sheet for Healthcare Providers: SeriousBroker.it  This test is not yet approved or cleared by the [place] FDA and has been authorized for detection and/or diagnosis of SARS-CoV-2 by FDA under an Emergency Use Authorization (EUA). This EUA will remain in effect (meaning this test can be used) for the duration of the COVID-19 declaration under Section 564(b)(1) of the Act, 21 U.S.C. section 360bbb-3(b)(1), unless the authorization is terminated or revoked.     Resp Syncytial Virus by PCR NEGATIVE NEGATIVE Final    Comment: (NOTE) Fact Sheet for Patients: BloggerCourse.com  Fact Sheet for Healthcare Providers: SeriousBroker.it  This test is not yet approved or cleared by the [place] FDA and has been authorized for detection and/or diagnosis of SARS-CoV-2 by FDA under an Emergency Use Authorization (EUA). This EUA will remain in effect (meaning this test can be used) for the duration of the COVID-19 declaration under Section 564(b)(1) of the Act, 21 U.S.C. section 360bbb-3(b)(1),  unless the authorization is terminated or revoked.  Performed at [facility], [address], [place], [place] [address]     Radiology Studies: CT Head Wo Contrast Result Date: 08/29/2023 CLINICAL DATA:  Increasing confusion, hypercalcemia, multiple myeloma EXAM: CT HEAD WITHOUT CONTRAST TECHNIQUE: Contiguous axial images were obtained from the base of the skull through the vertex without intravenous contrast. RADIATION DOSE REDUCTION: This exam was performed according to the departmental dose-optimization program which includes automated exposure control, adjustment of the mA and/or kV according to patient size and/or use of iterative reconstruction technique. COMPARISON:  10/16/2013, 04/22/2023 FINDINGS: Brain: No acute infarct or hemorrhage. Lateral ventricles and midline structures are unremarkable. No acute extra-axial fluid collections. No mass effect. Vascular: Atherosclerosis of the internal carotid arteries. No hyperdense vessel. Skull: Multiple new rounded lytic lesions are seen  throughout the calvarium since the prior PET scan, consistent with progression of known multiple myeloma. There are no acute displaced fractures. Sinuses/Orbits: Left mastoid effusion. The paranasal sinuses are clear. Other: None. IMPRESSION: 1. No acute intracranial process. 2. Lytic lesions seen throughout the calvarium, increased since prior exam, consistent with progression of known multiple myeloma. 3. Left mastoid effusion. Electronically Signed   By: Sharlet Salina M.D.   On: 08/29/2023 15:41   DG Chest Port 1 View Result Date: 08/29/2023 CLINICAL DATA:  Altered mental status.  Chest pain. EXAM: PORTABLE CHEST 1 VIEW COMPARISON:  August 16, 2023. FINDINGS: Stable cardiomediastinal silhouette. Mild bibasilar subsegmental atelectasis is noted with possible minimal pleural effusions. Bilateral rib fractures are again noted. IMPRESSION: Mild bibasilar subsegmental atelectasis with possible minimal  pleural effusions. Electronically Signed   By: [name] M.D.   On: 08/29/2023 13:47   Scheduled Meds:  acyclovir  400 mg Oral Daily   apixaban  5 mg Oral BID   dexamethasone  8 mg Oral Daily   docusate sodium  300 mg Oral Daily   furosemide  40 mg Intravenous Q12H   loratadine  10 mg Oral Daily   morphine  15 mg Oral QHS   pantoprazole  40 mg Oral QAC breakfast   polyethylene glycol  17 g Oral BID   potassium chloride  40 mEq Oral TID   pregabalin  100 mg Oral Daily   pregabalin  200 mg Oral QHS   Continuous Infusions:  sodium chloride 150 mL/hr at 08/31/23 0902   potassium chloride 10 mEq (08/31/23 1113)     LOS: 1 day    Time spent: 50 mins    Willeen Niece, MD [facility]   If 7PM-7AM, please contact night-coverage

## 2023-08-31 NOTE — Progress Notes (Signed)
   08/31/23 2254  BiPAP/CPAP/SIPAP  BiPAP/CPAP/SIPAP Pt Type Adult  BiPAP/CPAP/SIPAP Resmed  Mask Type Nasal pillows  Dentures removed? Not applicable  Flow Rate 5 lpm  Patient Home Machine Yes  Safety Check Completed by RT for Home Unit Yes, no issues noted  Patient Home Mask Yes  Patient Home Tubing Yes  Device Plugged into RED Power Outlet Yes

## 2023-08-31 NOTE — Telephone Encounter (Signed)
 Received call from pt's grand daughter, Daniel Whitaker asking for Dr Bertis Ruddy to increase his pain medication, as he is having difficulty getting comfortable. I s/w MD and she states:   "please call her, typically for hospital patient it is best to discuss with the hospitalist. I am hesitant to increase his oxycodone this morning because he was somewhat sedated/with changes in mental status and on high flow oxygen. increasing his pain medication could potentially send him to ICU and he will not be able to get chemo there. He has intermittent IV pain medication available as needed and they can get his nurse to give it if needed"  Encouraged [name] to reach out to the bedside nurse to check if he is able to have PRN pain med, and discussed repositioning for comfort. She verbalized understanding and agreement.

## 2023-09-01 ENCOUNTER — Other Ambulatory Visit: Payer: Self-pay | Admitting: Hematology and Oncology

## 2023-09-01 LAB — COMPREHENSIVE METABOLIC PANEL WITH GFR
ALT: 9 U/L (ref 0–44)
AST: 10 U/L — ABNORMAL LOW (ref 15–41)
Albumin: 2.5 g/dL — ABNORMAL LOW (ref 3.5–5.0)
Alkaline Phosphatase: 53 U/L (ref 38–126)
Anion gap: 11 (ref 5–15)
BUN: 42 mg/dL — ABNORMAL HIGH (ref 8–23)
CO2: 26 mmol/L (ref 22–32)
Calcium: 11.1 mg/dL — ABNORMAL HIGH (ref 8.9–10.3)
Chloride: 101 mmol/L (ref 98–111)
Creatinine, Ser: 0.94 mg/dL (ref 0.61–1.24)
GFR, Estimated: >60 mL/min (ref >60)
Glucose, Bld: 97 mg/dL (ref 70–99)
Potassium: 3.3 mmol/L — ABNORMAL LOW (ref 3.5–5.1)
Sodium: 138 mmol/L (ref 135–145)
Total Bilirubin: 0.5 mg/dL (ref 0.0–1.2)
Total Protein: 8.1 g/dL (ref 6.5–8.1)

## 2023-09-01 LAB — RENAL FUNCTION PANEL
Albumin: 2.4 g/dL — ABNORMAL LOW (ref 3.5–5.0)
Anion gap: 12 (ref 5–15)
BUN: 43 mg/dL — ABNORMAL HIGH (ref 8–23)
CO2: 25 mmol/L (ref 22–32)
Calcium: 11.2 mg/dL — ABNORMAL HIGH (ref 8.9–10.3)
Chloride: 101 mmol/L (ref 98–111)
Creatinine, Ser: 0.96 mg/dL (ref 0.61–1.24)
GFR, Estimated: >60 mL/min (ref >60)
Glucose, Bld: 97 mg/dL (ref 70–99)
Phosphorus: 2.9 mg/dL (ref 2.5–4.6)
Potassium: 3.3 mmol/L — ABNORMAL LOW (ref 3.5–5.1)
Sodium: 138 mmol/L (ref 135–145)

## 2023-09-01 LAB — CBC
HCT: 27.5 % — ABNORMAL LOW (ref 39.0–52.0)
Hemoglobin: 8.4 g/dL — ABNORMAL LOW (ref 13.0–17.0)
MCH: 27.7 pg (ref 26.0–34.0)
MCHC: 30.5 g/dL (ref 30.0–36.0)
MCV: 90.8 fL (ref 80.0–100.0)
Platelets: 217 10*3/uL (ref 150–400)
RBC: 3.03 MIL/uL — ABNORMAL LOW (ref 4.22–5.81)
RDW: 17.3 % — ABNORMAL HIGH (ref 11.5–15.5)
WBC: 6.6 10*3/uL (ref 4.0–10.5)
nRBC: 0 % (ref 0.0–0.2)

## 2023-09-01 LAB — KAPPA/LAMBDA LIGHT CHAINS
Kappa free light chain: 511.5 mg/L — ABNORMAL HIGH (ref 3.3–19.4)
Kappa, lambda light chain ratio: 300.88 — ABNORMAL HIGH (ref 0.26–1.65)
Lambda free light chains: 1.7 mg/L — ABNORMAL LOW (ref 5.7–26.3)

## 2023-09-01 LAB — PHOSPHORUS: Phosphorus: 2.8 mg/dL (ref 2.5–4.6)

## 2023-09-01 LAB — MAGNESIUM: Magnesium: 1.4 mg/dL — ABNORMAL LOW (ref 1.7–2.4)

## 2023-09-01 LAB — BETA 2 MICROGLOBULIN, SERUM: Beta-2 Microglobulin: 3.4 mg/L — ABNORMAL HIGH (ref 0.6–2.4)

## 2023-09-01 MED ORDER — POTASSIUM CHLORIDE CRYS ER 20 MEQ PO TBCR
40.0000 meq | EXTENDED_RELEASE_TABLET | ORAL | Status: AC
  Administered 2023-09-01 (×2): 40 meq via ORAL
  Filled 2023-09-01 (×2): qty 2

## 2023-09-01 MED ORDER — LIDOCAINE-PRILOCAINE 2.5-2.5 % EX CREA: TOPICAL_CREAM | CUTANEOUS | 3 refills | Status: DC

## 2023-09-01 MED ORDER — MAGNESIUM SULFATE 2 GM/50ML IV SOLN
2.0000 g | Freq: Once | INTRAVENOUS | Status: AC
  Administered 2023-09-01: 2 g via INTRAVENOUS
  Filled 2023-09-01: qty 50

## 2023-09-01 MED ORDER — MAGNESIUM CITRATE PO SOLN
1.0000 | Freq: Once | ORAL | Status: DC
  Filled 2023-09-01: qty 296

## 2023-09-01 MED ORDER — DEXAMETHASONE 4 MG PO TABS: 8.0000 mg | ORAL_TABLET | Freq: Every day | ORAL | Status: DC

## 2023-09-01 MED ORDER — SODIUM CHLORIDE 0.45 % IV SOLN: INTRAVENOUS | Status: AC

## 2023-09-01 NOTE — Consult Note (Signed)
 Chief Complaint: Multiple myeloma; referred for port a cath placement to assist with treatment  Referring Provider(s): Gorsuch,N  Supervising Physician: Roanna Banning  Patient Status: [facility] - In-pt  History of Present Illness: Daniel Whitaker is a 78 yo male ex smoker  with PMH sig for arthritis, CAD, GERD, anemia, glaucoma, HLD, spinal stenosis and multiple myeloma, now with disease progression. Request received from oncology for port  a cath placement to assist with palliative treatment.    Patient is Full Code  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   Nocturnal leg cramps 03/11/2021   Nontoxic uninodular goiter    Obesity    Peripheral neuropathy    RLS (restless legs syndrome)    Spinal stenosis    Unspecified deficiency anemia 07/11/2013    Past Surgical History:  Procedure Laterality Date   CARDIAC CATHETERIZATION     COLON RESECTION N/A 04/21/2013   Procedure: DIAGNOSTIC LAPAROSCOPY, lysis of adhesions for partial small bowel obstruction;  Surgeon: Adolph Pollack, MD;  Location: [facility] ORS;  Service: General;  Laterality: N/A;   COLON SURGERY     partial SBO    COLONOSCOPY     CORONARY STENT INTERVENTION N/A 10/22/2021   Procedure: CORONARY STENT INTERVENTION;  Surgeon: Marykay Lex, MD;  Location: [facility] INVASIVE CV LAB;  Service: Cardiovascular;  Laterality: N/A;   KNEE SURGERY     Arthroscopic   LEFT HEART CATH AND CORONARY ANGIOGRAPHY N/A 10/22/2021   Procedure: LEFT HEART CATH AND CORONARY ANGIOGRAPHY;  Surgeon: Marykay Lex, MD;  Location: [facility] INVASIVE CV LAB;  Service: Cardiovascular;  Laterality: N/A;   SMALL INTESTINE SURGERY     Blockage     Allergies: Atorvastatin, Cymbalta [duloxetine hcl], and Penicillins  Medications: Prior to Admission medications   Medication Sig Start Date End Date Taking? Authorizing Provider  acyclovir (ZOVIRAX) 400 MG tablet Take 1 tablet (400 mg total) by mouth daily. 06/09/23  Yes Gorsuch, Ni, MD  albuterol (VENTOLIN HFA) 108 (90 Base) MCG/ACT inhaler Inhale 1-2 puffs into the lungs every 6 (six) hours as needed for wheezing or shortness of breath.   Yes [provider]  apixaban (ELIQUIS) 5 MG TABS tablet Take 1 tablet (5 mg total) by mouth 2 (two) times daily. 08/20/23  Yes Alwyn Ren, MD  brimonidine [facility]) 0.2 % ophthalmic solution Place 1 drop into the left eye in the morning and at bedtime. 01/08/20  Yes [provider]  calcitonin, salmon, (MIACALCIN/FORTICAL) 200 UNIT/ACT nasal spray Place 1 spray into alternate nostrils daily.   Yes [provider]  cetirizine (ZYRTEC) 10 MG tablet Take 10 mg by mouth every morning.   Yes [provider]  cyclobenzaprine (FLEXERIL) 10 MG tablet Take 10 mg by mouth daily as needed for muscle spasms.   Yes [provider]  DARZALEX FASPRO 1800-30000 MG-UT/15ML SOLN Inject 1,800 mg into the skin once a week. Every friday 08/04/23  Yes [provider]  DENTA 5000 PLUS 1.1 % CREA dental cream Place 1 Application onto teeth at bedtime. 06/08/23  Yes [provider]  dexamethasone (DECADRON) 4 MG tablet Take 1 pill daily  except 4 pills every Friday. Patient taking differently: Take 8-16 mg by mouth See admin instructions. Take 8mg  (2 tablets) by mouth daily except on Friday's. On Friday's, take 16mg  (4 tablets). 08/25/23  Yes Gorsuch, Ni, MD  docusate sodium (COLACE) 100 MG capsule Take 300 mg by mouth daily.   Yes [provider]  dorzolamide-timolol (COSOPT) 22.3-6.8 MG/ML ophthalmic solution Place 1 drop into both eyes 2 (two) times daily. 03/09/18  Yes [provider]   furosemide (LASIX) 40 MG tablet Take 1 tablet (40 mg) in the morning and 1/2 tablet (20 mg) in the afternoon 08/25/23  Yes Gorsuch, Ni, MD  ketoconazole (NIZORAL) 2 % cream Apply 1 application  topically daily as needed for irritation. 11/28/14  Yes [provider]  latanoprost (XALATAN) 0.005 % ophthalmic solution Place 1 drop into both eyes at bedtime. 03/01/13  Yes [provider]  lidocaine (XYLOCAINE) 5 % ointment Apply 1 Application topically 2 (two) times daily as needed. 09/29/22  Yes Butch Penny, NP  morphine (MS CONTIN) 15 MG 12 hr tablet Take 1 tablet (15 mg total) by mouth at bedtime. 08/25/23  Yes [name], MD  Multiple Vitamins-Minerals (CENTRUM SILVER 50+MEN) TABS Take 1 tablet by mouth daily with breakfast.   Yes [provider]  nitroGLYCERIN (NITROSTAT) 0.4 MG SL tablet Place 1 tablet (0.4 mg total) under the tongue every 5 (five) minutes as needed. 10/22/21  Yes Arty Baumgartner, NP  ondansetron (ZOFRAN) 8 MG tablet Take 1 tablet (8 mg total) by mouth every 8 (eight) hours as needed for nausea or vomiting. 05/11/23  Yes Bertis Ruddy, Ni, MD  Oxycodone HCl 10 MG TABS Take 1 tablet (10 mg total) by mouth every 4 (four) hours. 08/24/23  Yes [name], MD  pantoprazole (PROTONIX) 40 MG tablet Take 40 mg by mouth daily before breakfast. 03/14/13  Yes [provider]  polyethylene glycol (MIRALAX / GLYCOLAX) 17 g packet Take 17 g by mouth 2 (two) times daily. 07/30/23  Yes Gorsuch, Ni, MD  pregabalin (LYRICA) 100 MG capsule TAKE 1 CAPSULE BY MOUTH IN THE MORNING, AND AT NOON, AND 2 CAPSULES AT NIGHT Patient taking differently: Take 100-200 mg by mouth See admin instructions. Take 100mg  (1 capsule) by mouth every morning and 200mg  (2 capsules) every night. 02/17/23  Yes Ocie Doyne, MD  prochlorperazine (COMPAZINE) 10 MG tablet Take 1 tablet (10 mg total) by mouth every 6 (six) hours as needed for nausea or vomiting. 05/11/23  Yes Gorsuch, Ni, MD   rosuvastatin (CRESTOR) 40 MG tablet Take 40 mg by mouth in the morning and at bedtime.   Yes [provider]  senna-docusate (SENOKOT-S) 8.6-50 MG tablet Take 2 tablets by mouth 2 (two) times daily. Patient taking differently: Take 2 tablets by mouth 2 (two) times daily as needed for moderate constipation. 07/30/23  Yes Gorsuch, Ni, MD  SSD 1 % cream Apply 1 Application topically daily. 08/09/23  Yes [provider]  triamcinolone (NASACORT) 55 MCG/ACT AERO nasal inhaler Place 2 sprays into the nose daily. Patient taking differently: Place 1 spray into the nose daily as needed. 06/03/23  Yes Gorsuch, Ni, MD  TYLENOL 500 MG tablet Take 1,000 mg by mouth every 6 (six) hours as needed for mild pain (pain score 1-3) or headache.   Yes [provider]  lidocaine-prilocaine (EMLA) cream Apply to affected area once 09/01/23   [name], MD     Family History  Problem Relation Age of Onset  Brain cancer Mother    Lung cancer Father    Heart disease Brother        No details   Dementia Brother    Neuropathy Neg Hx    Colon cancer Neg Hx    Colon polyps Neg Hx    Esophageal cancer Neg Hx    Rectal cancer Neg Hx    Stomach cancer Neg Hx    Sleep apnea Neg Hx     Social History   Socioeconomic History   Marital status: Married    Spouse name: Bonita Quin   Number of children: 2   Years of education: GED   Highest education level: Not on file  Occupational History   Occupation: still working  Tobacco Use   Smoking status: Former    Current packs/day: 0.00    Types: Cigarettes    Quit date: 06/01/1984    Years since quitting: 39.2   Smokeless tobacco: Never  Vaping Use   Vaping status: Never Used  Substance and Sexual Activity   Alcohol use: Not Currently    Alcohol/week: 10.0 standard drinks of alcohol    Types: 5 Glasses of wine, 5 Cans of beer per week   Drug use: No   Sexual activity: Not on file  Other Topics Concern   Not on file  Social History  Narrative   Patient lives at home with his wife Bonita Quin)   Retired.    Patient has two children.   Patient is right-handed.   Patient has his GED.   Patient does not drink caffeine.   Social Drivers of Corporate investment banker Strain: Not on file  Food Insecurity: No Food Insecurity (08/30/2023)   Hunger Vital Sign    Worried About Running Out of Food in the Last Year: Never true    Ran Out of Food in the Last Year: Never true  Transportation Needs: No Transportation Needs (08/30/2023)   PRAPARE - Administrator, Civil Service (Medical): No    Lack of Transportation (Non-Medical): No  Physical Activity: Not on file  Stress: Not on file  Social Connections: Moderately Integrated (08/30/2023)   Social Connection and Isolation Panel [NHANES]    Frequency of Communication with Friends and Family: Three times a week    Frequency of Social Gatherings with Friends and Family: Three times a week    Attends Religious Services: 1 to 4 times per year    Active Member of Clubs or Organizations: No    Attends Banker Meetings: Never    Marital Status: Married       Review of Systems denies fever,HA, N/V or bleeding; he does have occ abd/back pain, dyspnea, occ cough, fatigue  Vital Signs: BP (!) 145/73 (BP Location: Left Arm)   Pulse 79   Temp 98.1 F (36.7 C) (Oral)   Resp 20   Ht 6' 0.01" (1.829 m)   Wt 205 lb 11 oz (93.3 kg)   SpO2 98%   BMI 27.89 kg/m   Advance Care Plan: no documents on file.  Physical Exam; awake/answers questions ok; grandson in room; ; chest- sl dim BS bases; heart- RRR; abd-soft,+BS, some mild gen tenderness to palpation; no LE edema  Imaging: CT Head Wo Contrast Result Date: 08/29/2023 CLINICAL DATA:  Increasing confusion, hypercalcemia, multiple myeloma EXAM: CT HEAD WITHOUT CONTRAST TECHNIQUE: Contiguous axial images were obtained from the base of the skull through the vertex without intravenous contrast. RADIATION DOSE  REDUCTION: This exam was  performed according to the departmental dose-optimization program which includes automated exposure control, adjustment of the mA and/or kV according to patient size and/or use of iterative reconstruction technique. COMPARISON:  10/16/2013, 04/22/2023 FINDINGS: Brain: No acute infarct or hemorrhage. Lateral ventricles and midline structures are unremarkable. No acute extra-axial fluid collections. No mass effect. Vascular: Atherosclerosis of the internal carotid arteries. No hyperdense vessel. Skull: Multiple new rounded lytic lesions are seen throughout the calvarium since the prior PET scan, consistent with progression of known multiple myeloma. There are no acute displaced fractures. Sinuses/Orbits: Left mastoid effusion. The paranasal sinuses are clear. Other: None. IMPRESSION: 1. No acute intracranial process. 2. Lytic lesions seen throughout the calvarium, increased since prior exam, consistent with progression of known multiple myeloma. 3. Left mastoid effusion. Electronically Signed   By: Sharlet Salina M.D.   On: 08/29/2023 15:41   DG Chest Port 1 View Result Date: 08/29/2023 CLINICAL DATA:  Altered mental status.  Chest pain. EXAM: PORTABLE CHEST 1 VIEW COMPARISON:  August 16, 2023. FINDINGS: Stable cardiomediastinal silhouette. Mild bibasilar subsegmental atelectasis is noted with possible minimal pleural effusions. Bilateral rib fractures are again noted. IMPRESSION: Mild bibasilar subsegmental atelectasis with possible minimal pleural effusions. Electronically Signed   By: [name] M.D.   On: 08/29/2023 13:47   DG Shoulder Left Result Date: 08/19/2023 CLINICAL DATA:  Pain.  No history of trauma reported EXAM: LEFT SHOULDER - 3 VIEW COMPARISON:  None Available. FINDINGS: Joint space loss and sclerosis of the [facility] joint. Preserved glenohumeral joint. No fracture or dislocation of the shoulder. Osteopenia. Multiple left-sided rib deformities. Please correlate with  separate x-ray of the chest from 08/16/2023. there is some areas of lucency along the shaft of the humerus. Some of these are subcortical. IMPRESSION: Degenerative changes of the [facility] joint. Osteopenia. Multiple old left rib fractures. Lucent lesions along the proximal left humeral shaft. Some of these are subcortical. Please correlate for any known history such as multiple myeloma. Electronically Signed   By: Karen Kays M.D.   On: 08/19/2023 16:49   ECHOCARDIOGRAM COMPLETE Result Date: 08/17/2023    ECHOCARDIOGRAM REPORT   Patient Name:   Daniel Whitaker Date of Exam: 08/17/2023 Medical Rec #:  [id number]        Height:       72.0 in Accession #:    [id number]       Weight:       218.3 lb Date of Birth:  1945-10-14       BSA:          2.211 m Patient Age:    77 years         BP:           130/51 mmHg Patient Gender: M                HR:           63 bpm. Exam Location:  Inpatient Procedure: 2D Echo, Cardiac Doppler, Color Doppler and Intracardiac            Opacification Agent (Both Spectral and Color Flow Doppler were            utilized during procedure). Indications:    A fib  History:        Patient has prior history of Echocardiogram examinations, most                 recent 07/20/2023. CAD, Arrythmias:Atrial Fibrillation; Risk  Factors:Hypertension.  Sonographer:    Amy Chionchio Referring Phys: [id number] SUBRINA SUNDIL IMPRESSIONS  1. Left ventricular ejection fraction, by estimation, is 60 to 65%. The left ventricle has normal function. The left ventricle has no regional wall motion abnormalities. Left ventricular diastolic parameters were normal.  2. Right ventricular systolic function is normal. The right ventricular size is normal.  3. Left atrial size was severely dilated.  4. The mitral valve is normal in structure. Mild mitral valve regurgitation. No evidence of mitral stenosis.  5. The aortic valve is tricuspid. Aortic valve regurgitation is not visualized. Aortic valve sclerosis is  present, with no evidence of aortic valve stenosis.  6. Aortic dilatation noted. There is mild dilatation of the ascending aorta, measuring 39 mm. FINDINGS  Left Ventricle: Left ventricular ejection fraction, by estimation, is 60 to 65%. The left ventricle has normal function. The left ventricle has no regional wall motion abnormalities. Definity contrast agent was given IV to delineate the left ventricular  endocardial borders. The left ventricular internal cavity size was normal in size. There is no left ventricular hypertrophy. Left ventricular diastolic parameters were normal. Right Ventricle: The right ventricular size is normal. Right ventricular systolic function is normal. Left Atrium: Left atrial size was severely dilated. Right Atrium: Right atrial size was normal in size. Pericardium: There is no evidence of pericardial effusion. Mitral Valve: The mitral valve is normal in structure. Mild mitral valve regurgitation. No evidence of mitral valve stenosis. MV peak gradient, 4.2 mmHg. The mean mitral valve gradient is 1.0 mmHg. Tricuspid Valve: The tricuspid valve is normal in structure. Tricuspid valve regurgitation is mild . No evidence of tricuspid stenosis. Aortic Valve: The aortic valve is tricuspid. Aortic valve regurgitation is not visualized. Aortic valve sclerosis is present, with no evidence of aortic valve stenosis. Aortic valve mean gradient measures 4.0 mmHg. Aortic valve peak gradient measures 8.5  mmHg. Aortic valve area, by VTI measures 2.66 cm. Pulmonic Valve: The pulmonic valve was not well visualized. Pulmonic valve regurgitation is not visualized. No evidence of pulmonic stenosis. Aorta: Aortic dilatation noted. There is mild dilatation of the ascending aorta, measuring 39 mm. Venous: The inferior vena cava was not well visualized. IAS/Shunts: The interatrial septum was not well visualized.  LEFT VENTRICLE PLAX 2D LVIDd:         5.00 cm      Diastology LVIDs:         2.80 cm      LV e'  medial:    7.62 cm/s LV PW:         0.80 cm      LV E/e' medial:  11.9 LV IVS:        0.80 cm      LV e' lateral:   9.90 cm/s LVOT diam:     2.10 cm      LV E/e' lateral: 9.2 LV SV:         80 LV SV Index:   36 LVOT Area:     3.46 cm  LV Volumes (MOD) LV vol d, MOD A4C: 195.0 ml LV vol s, MOD A4C: 82.6 ml LV SV MOD A4C:     195.0 ml RIGHT VENTRICLE RV Basal diam:  5.10 cm TAPSE (M-mode): 2.0 cm LEFT ATRIUM              Index        RIGHT ATRIUM           Index LA Vol (A2C):  162.0 ml 73.27 ml/m  RA Area:     21.80 cm LA Vol (A4C):   146.5 ml 66.26 ml/m  RA Volume:   67.00 ml  30.30 ml/m LA Biplane Vol: 150.0 ml 67.84 ml/m  AORTIC VALVE                    PULMONIC VALVE AV Area (Vmax):    2.60 cm     PV Vmax:       0.90 m/s AV Area (Vmean):   2.50 cm     PV Peak grad:  3.2 mmHg AV Area (VTI):     2.66 cm AV Vmax:           146.00 cm/s AV Vmean:          97.500 cm/s AV VTI:            0.299 m AV Peak Grad:      8.5 mmHg AV Mean Grad:      4.0 mmHg LVOT Vmax:         109.50 cm/s LVOT Vmean:        70.350 cm/s LVOT VTI:          0.230 m LVOT/AV VTI ratio: 0.77  AORTA Ao Root diam: 3.50 cm Ao Asc diam:  4.10 cm MITRAL VALVE               TRICUSPID VALVE MV Area (PHT): 3.34 cm    TR Peak grad:   38.2 mmHg MV Area VTI:   2.58 cm    TR Vmax:        309.00 cm/s MV Peak grad:  4.2 mmHg MV Mean grad:  1.0 mmHg    SHUNTS MV Vmax:       1.02 m/s    Systemic VTI:  0.23 m MV Vmean:      58.1 cm/s   Systemic Diam: 2.10 cm MV Decel Time: 227 msec MV E velocity: 90.80 cm/s MV A velocity: 62.40 cm/s MV E/A ratio:  1.46 Olga Millers MD Electronically signed by Olga Millers MD Signature Date/Time: 08/17/2023/11:31:41 AM    Final    DG Chest Port 1 View Result Date: 08/16/2023 CLINICAL DATA:  Shortness of breath and fever. Current chemotherapy. History of multiple myeloma previous exam. EXAM: PORTABLE CHEST 1 VIEW COMPARISON:  07/25/2023, 07/26/2023. FINDINGS: The heart is enlarged and mediastinal contours is stable.  Lung volumes are low with patchy airspace disease at the lung bases, increased from the prior exam. There are small bilateral pleural effusions. No pneumothorax is seen. Multiple lytic lesions are seen within the bones. Bilateral rib fractures are noted and unchanged from the previous exam. IMPRESSION: 1. Increased patchy airspace disease at the lung bases, possible atelectasis or infiltrate. 2. Small bilateral pleural effusions. 3. Stable lytic lesions throughout the bones with bilateral rib fractures and compatible with history of multiple myeloma. Electronically Signed   By: Thornell Sartorius M.D.   On: 08/16/2023 19:29    Labs:  CBC: Recent Labs    08/29/23 1336 08/29/23 1352 08/30/23 0841 08/31/23 0632 09/01/23 0613  WBC 9.2  --  8.7 7.5 6.6  HGB 9.1* 9.5* 9.4* 8.7* 8.4*  HCT 29.6* 28.0* 30.7* 28.3* 27.5*  PLT 260  --  289 255 217    COAGS: Recent Labs    04/27/23 0728 05/03/23 1622 08/16/23 1757  INR 1.1 1.3* 1.2  APTT  --  33 30    BMP: Recent Labs    08/30/23  1900 08/31/23 1610 08/31/23 1252 08/31/23 2049 09/01/23 0613 09/01/23 0614  NA 141 142  --   --  138 138  K 2.9* 2.6*  --   --  3.3* 3.3*  CL 99 99  --   --  101 101  CO2 29 30  --   --  26 25  GLUCOSE 162* 90  --   --  97 97  BUN 46* 40*  --   --  42* 43*  CALCIUM 11.9* 11.8* 12.0* 11.7* 11.1* 11.2*  CREATININE 1.21 1.07  --   --  0.94 0.96  GFRNONAA >60 >60  --   --  >60 >60    LIVER FUNCTION TESTS: Recent Labs    08/27/23 0937 08/29/23 1336 08/30/23 0841 08/31/23 0632 09/01/23 0613 09/01/23 0614  BILITOT 0.4 0.4 0.4  --  0.5  --   AST 12* 13* 9*  --  10*  --   ALT 9 11 8   --  9  --   ALKPHOS 78 66 68  --  53  --   PROT 9.8* 9.1* 9.7*  --  8.1  --   ALBUMIN 3.3* 2.6* 3.2* 2.6* 2.5* 2.4*    TUMOR MARKERS: No results for input(s): "AFPTM", "CEA", "CA199", "CHROMGRNA" in the last 8760 hours.  Assessment and Plan: 78 yo male ex smoker  with PMH sig for arthritis, CAD, GERD, anemia,  glaucoma, HLD, spinal stenosis and multiple myeloma, now with disease progression. Request received from oncology for port  a cath placement to assist with palliative treatment. Risks and benefits of image guided port-a-catheter placement was discussed with the patient/grandson including, but not limited to bleeding, infection, pneumothorax, or fibrin sheath development and need for additional procedures.  All of the patient's questions were answered, patient is agreeable to proceed. Consent signed and in chart. Procedure tent planned for 4/3  Pt known to IR team from BM bx in 2024  Thank you for allowing our service to participate in Liberty Handy 's care.  Electronically Signed: D. Jeananne Rama, PA-C   09/01/2023, 4:54 PM      I spent a total of  20 minutes   in face to face in clinical consultation, greater than 50% of which was counseling/coordinating care for port a cath placement

## 2023-09-01 NOTE — Progress Notes (Signed)
 PROGRESS NOTE    Daniel Whitaker  ZOX:[id number] DOB: 03-Nov-1945 DOA: 08/30/2023 PCP: [name], MD   Brief Narrative:  78 yrs old Male with PMH significant for multiple myeloma with bony involvement undergoing active chemotherapy along with hypercalcemia being treated as an outpatient by oncology presented  with confusion, lethargy and muscle spasms.  He was found to have persistent hypercalcemia with calcium level of 13 and was directly admitted to the hospital from oncology clinic.  Oncology and nephrology following.  Assessment & Plan:   Symptomatic malignant hypercalcemia History of multiple myeloma with bony metastases -Oncology and nephrology following.  Calcium improving but still elevated at 11.2 today.  Monitor.  IV fluids and diuretics as per nephrology.  Also on dexamethasone -Patient has received Zometa and calcitonin -Oncology planning to continue inpatient chemotherapy  Hypokalemia -Replace.  Repeat a.m. labs  Hypomagnesemia -Replace.  Repeat a.m. labs.  Paroxysmal A-fib -Continue Eliquis.  Currently rate controlled  Chronic pain syndrome with opiate dependence -continue current pain management with MS Contin along with as needed oxycodone and IV Dilaudid.  Continue Lyrica.  Continue bowel regimen  Anemia of chronic disease -From chronic illnesses.  Hemoglobin currently stable.  Monitor intermittently  Acute metabolic encephalopathy -Possibly from hypercalcemia.  Monitor mental status.  Physical deconditioning -PT eval once more awake.   DVT prophylaxis: Eliquis Code Status: Full Family Communication: Multiple family members at bedside Disposition Plan: Status is: Inpatient Remains inpatient appropriate because: Of severity of illness    Consultants: Oncology/nephrology  Procedures: None  Antimicrobials:  Anti-infectives (From admission, onward)    Start     Dose/Rate Route Frequency Ordered Stop   08/31/23 1000  acyclovir (ZOVIRAX)  tablet 400 mg        400 mg Oral Daily 08/30/23 1149          Subjective: Patient seen and examined at bedside.  Slow to respond, poor historian.  No fever, vomiting reported.  Objective: Vitals:   08/31/23 0620 08/31/23 1417 08/31/23 2109 09/01/23 0505  BP: 135/62 (!) 142/83 138/62 (!) 157/70  Pulse: 70 79 70 69  Resp: 20  16 18   Temp: 97.9 F (36.6 C) 98.3 F (36.8 C) 98.7 F (37.1 C) (!) 97.3 F (36.3 C)  TempSrc: Oral Oral Oral Oral  SpO2: 95%  99% 95%  Weight:      Height:        Intake/Output Summary (Last 24 hours) at 09/01/2023 0744 Last data filed at 09/01/2023 0718 Gross per 24 hour  Intake 1554 ml  Output 1950 ml  Net -396 ml   Filed Weights   08/30/23 1900  Weight: 96.5 kg    Examination:  General exam: Appears calm and comfortable.  Looks chronically ill and deconditioned. Respiratory system: Bilateral decreased breath sounds at bases Cardiovascular system: S1 & S2 heard, Rate controlled Gastrointestinal system: Abdomen is distended, soft and nontender.  Bowel sounds are heard. Extremities: No cyanosis, clubbing, edema  Central nervous system: Wakes up slightly, slow to respond.  Poor historian.  No focal deficits noted.   Skin: No ecchymosis/petechiae Psychiatry: Flat affect.  Not agitated.    Data Reviewed: I have personally reviewed following labs and imaging studies  CBC: Recent Labs  Lab 08/25/23 0911 08/27/23 0937 08/29/23 1336 08/29/23 1352 08/30/23 0841 08/31/23 0632 09/01/23 0613  WBC 9.6 13.1* 9.2  --  8.7 7.5 6.6  NEUTROABS 7.6 10.8* 7.9*  --  6.7  --   --   HGB 9.3* 9.7* 9.1* 9.5*  9.4* 8.7* 8.4*  HCT 30.6* 30.4* 29.6* 28.0* 30.7* 28.3* 27.5*  MCV 89.5 85.2 88.9  --  88.5 89.3 90.8  PLT 272 311 260  --  289 255 217   Basic Metabolic Panel: Recent Labs  Lab 08/29/23 1336 08/29/23 1352 08/30/23 0841 08/30/23 1900 08/31/23 0632 08/31/23 1252 08/31/23 2049 09/01/23 0613 09/01/23 0614  NA 138   < > 140 141 142  --   --   138 138  K 3.1*   < > 3.1* 2.9* 2.6*  --   --  3.3* 3.3*  CL 100   < > 100 99 99  --   --  101 101  CO2 27  --  31 29 30   --   --  26 25  GLUCOSE 139*   < > 114* 162* 90  --   --  97 97  BUN 47*   < > 44* 46* 40*  --   --  42* 43*  CREATININE 1.16   < > 1.15 1.21 1.07  --   --  0.94 0.96  CALCIUM 12.7*  --  13.0* 11.9* 11.8* 12.0* 11.7* 11.1* 11.2*  MG 1.7  --  1.4*  --   --   --   --  1.4*  --   PHOS  --   --   --   --   --   --   --  2.8 2.9   < > = values in this interval not displayed.   GFR: Estimated Creatinine Clearance (by C-G formula based on SCr of 0.96 mg/dL) Male: 14.7 mL/min Male: 77.7 mL/min Liver Function Tests: Recent Labs  Lab 08/25/23 0911 08/27/23 0937 08/29/23 1336 08/30/23 0841 08/31/23 0632 09/01/23 0613 09/01/23 0614  AST 11* 12* 13* 9*  --  10*  --   ALT 8 9 11 8   --  9  --   ALKPHOS 81 78 66 68  --  53  --   BILITOT 0.3 0.4 0.4 0.4  --  0.5  --   PROT 9.0* 9.8* 9.1* 9.7*  --  8.1  --   ALBUMIN 3.2* 3.3* 2.6* 3.2* 2.6* 2.5* 2.4*   No results for input(s): "LIPASE", "AMYLASE" in the last 168 hours. No results for input(s): "AMMONIA" in the last 168 hours. Coagulation Profile: No results for input(s): "INR", "PROTIME" in the last 168 hours. Cardiac Enzymes: No results for input(s): "CKTOTAL", "CKMB", "CKMBINDEX", "TROPONINI" in the last 168 hours. BNP (last 3 results) No results for input(s): "PROBNP" in the last 8760 hours. HbA1C: No results for input(s): "HGBA1C" in the last 72 hours. CBG: Recent Labs  Lab 08/29/23 1322  GLUCAP 130*   Lipid Profile: No results for input(s): "CHOL", "HDL", "LDLCALC", "TRIG", "CHOLHDL", "LDLDIRECT" in the last 72 hours. Thyroid Function Tests: No results for input(s): "TSH", "T4TOTAL", "FREET4", "T3FREE", "THYROIDAB" in the last 72 hours. Anemia Panel: No results for input(s): "VITAMINB12", "FOLATE", "FERRITIN", "TIBC", "IRON", "RETICCTPCT" in the last 72 hours. Sepsis Labs: No results for input(s):  "PROCALCITON", "LATICACIDVEN" in the last 168 hours.  Recent Results (from the past 240 hours)  Resp panel by RT-PCR (RSV, Flu A&B, Covid) Anterior Nasal Swab     Status: None   Collection Time: 08/29/23  1:36 PM   Specimen: Anterior Nasal Swab  Result Value Ref Range Status   SARS Coronavirus 2 by RT PCR NEGATIVE NEGATIVE Final    Comment: (NOTE) SARS-CoV-2 target nucleic acids are NOT DETECTED.  The SARS-CoV-2  RNA is generally detectable in upper respiratory specimens during the acute phase of infection. The lowest concentration of SARS-CoV-2 viral copies this assay can detect is 138 copies/mL. A negative result does not preclude SARS-Cov-2 infection and should not be used as the sole basis for treatment or other patient management decisions. A negative result may occur with  improper specimen collection/handling, submission of specimen other than nasopharyngeal swab, presence of viral mutation(s) within the areas targeted by this assay, and inadequate number of viral copies(<138 copies/mL). A negative result must be combined with clinical observations, patient history, and epidemiological information. The expected result is Negative.  Fact Sheet for Patients:  BloggerCourse.com  Fact Sheet for Healthcare Providers:  SeriousBroker.it  This test is no t yet approved or cleared by the [place] FDA and  has been authorized for detection and/or diagnosis of SARS-CoV-2 by FDA under an Emergency Use Authorization (EUA). This EUA will remain  in effect (meaning this test can be used) for the duration of the COVID-19 declaration under Section 564(b)(1) of the Act, 21 U.S.C.section 360bbb-3(b)(1), unless the authorization is terminated  or revoked sooner.       Influenza A by PCR NEGATIVE NEGATIVE Final   Influenza B by PCR NEGATIVE NEGATIVE Final    Comment: (NOTE) The Xpert Xpress SARS-CoV-2/FLU/RSV plus assay is intended as  an aid in the diagnosis of influenza from Nasopharyngeal swab specimens and should not be used as a sole basis for treatment. Nasal washings and aspirates are unacceptable for Xpert Xpress SARS-CoV-2/FLU/RSV testing.  Fact Sheet for Patients: BloggerCourse.com  Fact Sheet for Healthcare Providers: SeriousBroker.it  This test is not yet approved or cleared by the [place] FDA and has been authorized for detection and/or diagnosis of SARS-CoV-2 by FDA under an Emergency Use Authorization (EUA). This EUA will remain in effect (meaning this test can be used) for the duration of the COVID-19 declaration under Section 564(b)(1) of the Act, 21 U.S.C. section 360bbb-3(b)(1), unless the authorization is terminated or revoked.     Resp Syncytial Virus by PCR NEGATIVE NEGATIVE Final    Comment: (NOTE) Fact Sheet for Patients: BloggerCourse.com  Fact Sheet for Healthcare Providers: SeriousBroker.it  This test is not yet approved or cleared by the [place] FDA and has been authorized for detection and/or diagnosis of SARS-CoV-2 by FDA under an Emergency Use Authorization (EUA). This EUA will remain in effect (meaning this test can be used) for the duration of the COVID-19 declaration under Section 564(b)(1) of the Act, 21 U.S.C. section 360bbb-3(b)(1), unless the authorization is terminated or revoked.  Performed at [facility], [address], [place], [place] [address]          Radiology Studies: No results found.      Scheduled Meds:  acyclovir  400 mg Oral Daily   apixaban  5 mg Oral BID   dexamethasone  8 mg Oral Daily   docusate sodium  300 mg Oral Daily   furosemide  20 mg Intravenous Q12H   loratadine  10 mg Oral Daily   morphine  15 mg Oral QHS   pantoprazole  40 mg Oral QAC breakfast   polyethylene glycol  17 g Oral BID    pregabalin  100 mg Oral Daily   pregabalin  200 mg Oral QHS   Continuous Infusions:  sodium chloride 150 mL/hr at 09/01/23 9629          Glade Lloyd, MD [facility] 09/01/2023, 7:44 AM

## 2023-09-01 NOTE — Progress Notes (Signed)
 Daniel Whitaker   DOB:10-04-1945   ZO#:[id number]    ASSESSMENT & PLAN:  Multiple myeloma not having achieved remission (HCC) He has IgA kappa multiple myeloma with multiple bone fractures at presentation in 2024.  Bone marrow biopsy showed 15% involvement, standard risk myeloma FISH and cytogenetics He had initial good partial response with combination chemotherapy with daratumumab, lenalidomide, bortezomib and dexamethasone Since recurrent hospitalization in February and March and interruption of treatment, he is now showing signs of disease progression with elevated serum total protein and reduce albumin Will repeat myeloma panel from 08/31/23 is pending   Overall, he has very very poor prognosis as he is progressing through first line of quadruple chemotherapy Patient is interested to pursue more palliative treatment  I have received approval to start palliative treatment with carfilzomib, Cytoxan and dexamethasone starting on 09/02/2023 Due to need for long-term IV access, I will place order for port placement but we do not have to wait for it to be placed necessarily before we start treatment We discussed risk, benefits, side effects of treatment and ultimately he agreed to proceed He will continue eliquis for DVT prophylaxis He will continue vitamin D supplement He is on acyclovir for antimicrobial prophylaxis There is potential risk for tumor lysis syndrome I will order serum LDH and uric acid level tomorrow and if it is high, he might need rasburicase and allopurinol   Deficiency anemia Multifactorial, due to myeloma, recent treatment and hemodilution This is stable Monitor closely He would be at high risk for transfusion needs Will order type and screen tomorrow   Hypercalcemia He has recurrent malignant hypercalcemia again He has failed outpatient therapy and is now being admitted His total calcium level is down but likely due to hemodilution Will continue corticosteroids,  calcitonin, furosemide, hydration and received 1 dose of Zometa on 08/31/2023 We will continue to monitor   Cancer related pain He has recent exacerbation of bone pain due to steroid withdrawal Pain control is improved reintroduction of dexamethasone and MS Contin at night He will continue oxycodone as needed along with acetaminophen He appears somewhat sedated likely due to side effects of hypercalcemia   Altered mental status, improving He is noted to have slow mentation likely due to hypercalcemia Multiple family members are by the bedside and agree with the plan of care   Severe constipation Multifactorial, likely due to immobility, side effects of opiates and hypercalcemia We will add magnesium citrate  Discharge planning He would likely be here for next 2 to 3 days He needs to stay for inpatient chemotherapy as he has failed outpatient therapy The risks of not treating him would be risk of death He will need to be here at least until Friday to complete 2 days of chemotherapy, or longer if needed if he still have persistent hypercalcemia I will follow tomorrow  [name], MD 09/01/2023 11:37 AM  Subjective:  He is seen today surrounded by multiple family members.  His pain is better controlled.  His shortness of breath is also stable.  Is he having good urine output.  Calcium level is stable. We discussed risk, benefits, side effects of switching his treatment to carfilzomib, Cytoxan and dexamethasone, starting tomorrow. He is somewhat constipated.  He has poor appetite Afebrile since admission.  No further confusion episode.  I addressed many questions from family members  Objective:  Vitals:   08/31/23 2109 09/01/23 0505  BP: 138/62 (!) 157/70  Pulse: 70 69  Resp: 16 18  Temp: 98.7  F (37.1 C) (!) 97.3 F (36.3 C)  SpO2: 99% 95%     Intake/Output Summary (Last 24 hours) at 09/01/2023 1137 Last data filed at 09/01/2023 0844 Gross per 24 hour  Intake 1437 ml  Output  2750 ml  Net -1313 ml

## 2023-09-01 NOTE — Plan of Care (Signed)
  Problem: Coping: Goal: Level of anxiety will decrease Outcome: Progressing   Problem: Elimination: Goal: Will not experience complications related to bowel motility Outcome: Progressing Goal: Will not experience complications related to urinary retention Outcome: Progressing   Problem: Safety: Goal: Ability to remain free from injury will improve Outcome: Progressing   Problem: Skin Integrity: Goal: Risk for impaired skin integrity will decrease Outcome: Progressing   

## 2023-09-01 NOTE — Progress Notes (Signed)
 [facility] Progress Note  Subjective:  Wt's down 3 kg from admit 1.2 L in and 1800 cc UOP yest Ca++ stable at 11.2  Vitals:   08/31/23 2109 09/01/23 0505 09/01/23 0710 09/01/23 1324  BP: 138/62 (!) 157/70  (!) 145/73  Pulse: 70 69  79  Resp: 16 18  20   Temp: 98.7 F (37.1 C) (!) 97.3 F (36.3 C)  98.1 F (36.7 C)  TempSrc: Oral Oral  Oral  SpO2: 99% 95%  98%  Weight:   93.3 kg   Height:        Exam: Gen alert, [place] O2 in place, appropriate No jvd or bruits Chest clear bilat to bases RRR no MRG Abd soft ntnd no mass or ascites +bs GU nl male, condom cath draining mostly clear urine Ext no LE edema Neuro as above    Renal-related home meds: Calcitonin 200 unit 1 spray daily Decadron as directed Lasix 40mg  qam and 20mg  qpm Revlimid 10mg  as directed MS contin Oxy IR PPI Lyrica    CXR 3/30 - IMPRESSION: Mild bibasilar subsegmental atelectasis with possible minimal pleural effusions.   Assessment/ Plan: Hypercalcemia: Ca++ in the 13s here. Recurrent issue for this pt w/ multiple myeloma. Renal function wnl. Has been getting IVF"s 150-200 cc/hr, IV lasix, calcitonin SQ x 4 and IV decadron. S/P 1 dose of zometa. Cont IVF"s- will ^ 1/2 NS to 175 cc/hr and cont low dose IV lasix 20 bid.   Hypernatremia: Na+ better on hypotonic IVF"s (1/2 NS).  Multiple myeloma: myeloma markers are worsening, per ONC pt is interested to pursue more palliative treatment. To start on 09/02/23.        Vinson Moselle MD  [facility] 09/01/2023, 1:37 PM  Recent Labs  Lab 08/31/23 0272 08/31/23 1252 09/01/23 0613 09/01/23 0614  HGB 8.7*  --  8.4*  --   ALBUMIN 2.6*  --  2.5* 2.4*  CALCIUM 11.8*   < > 11.1* 11.2*  PHOS  --   --  2.8 2.9  CREATININE 1.07  --  0.94 0.96  K 2.6*  --  3.3* 3.3*   < > = values in this interval not displayed.   No results for input(s): "IRON", "TIBC", "FERRITIN" in the last 168 hours. Inpatient medications:  acyclovir  400 mg Oral Daily   apixaban  5  mg Oral BID   [START ON 09/04/2023] dexamethasone  8 mg Oral Daily   docusate sodium  300 mg Oral Daily   furosemide  20 mg Intravenous Q12H   loratadine  10 mg Oral Daily   magnesium citrate  1 Bottle Oral Once   morphine  15 mg Oral QHS   pantoprazole  40 mg Oral QAC breakfast   polyethylene glycol  17 g Oral BID   pregabalin  100 mg Oral Daily   pregabalin  200 mg Oral QHS     acetaminophen **OR** acetaminophen, albuterol, cyclobenzaprine, HYDROmorphone (DILAUDID) injection, ondansetron **OR** ondansetron (ZOFRAN) IV, mouth rinse, oxyCODONE, traZODone

## 2023-09-02 ENCOUNTER — Other Ambulatory Visit: Payer: Self-pay | Admitting: Hematology and Oncology

## 2023-09-02 ENCOUNTER — Encounter: Payer: Self-pay | Admitting: Hematology and Oncology

## 2023-09-02 ENCOUNTER — Inpatient Hospital Stay (HOSPITAL_COMMUNITY)

## 2023-09-02 DIAGNOSIS — D649 Anemia, unspecified: Secondary | ICD-10-CM | POA: Diagnosis not present

## 2023-09-02 DIAGNOSIS — G893 Neoplasm related pain (acute) (chronic): Secondary | ICD-10-CM | POA: Diagnosis not present

## 2023-09-02 DIAGNOSIS — C9000 Multiple myeloma not having achieved remission: Secondary | ICD-10-CM

## 2023-09-02 HISTORY — PX: IR IMAGING GUIDED PORT INSERTION: IMG5740

## 2023-09-02 LAB — CBC WITH DIFFERENTIAL/PLATELET
Abs Immature Granulocytes: 0.02 10*3/uL (ref 0.00–0.07)
Basophils Absolute: 0 10*3/uL (ref 0.0–0.1)
Basophils Relative: 1 %
Eosinophils Absolute: 0.3 10*3/uL (ref 0.0–0.5)
Eosinophils Relative: 4 %
HCT: 28.9 % — ABNORMAL LOW (ref 39.0–52.0)
Hemoglobin: 8.9 g/dL — ABNORMAL LOW (ref 13.0–17.0)
Immature Granulocytes: 0 %
Lymphocytes Relative: 14 %
Lymphs Abs: 0.9 10*3/uL (ref 0.7–4.0)
MCH: 27.6 pg (ref 26.0–34.0)
MCHC: 30.8 g/dL (ref 30.0–36.0)
MCV: 89.5 fL (ref 80.0–100.0)
Monocytes Absolute: 0.3 10*3/uL (ref 0.1–1.0)
Monocytes Relative: 5 %
Neutro Abs: 4.7 10*3/uL (ref 1.7–7.7)
Neutrophils Relative %: 76 %
Platelets: 234 10*3/uL (ref 150–400)
RBC: 3.23 MIL/uL — ABNORMAL LOW (ref 4.22–5.81)
RDW: 17.1 % — ABNORMAL HIGH (ref 11.5–15.5)
WBC: 6.1 10*3/uL (ref 4.0–10.5)
nRBC: 0 % (ref 0.0–0.2)

## 2023-09-02 LAB — URIC ACID: Uric Acid, Serum: 6.9 mg/dL (ref 3.7–8.6)

## 2023-09-02 LAB — RENAL FUNCTION PANEL
Albumin: 2.7 g/dL — ABNORMAL LOW (ref 3.5–5.0)
Anion gap: 9 (ref 5–15)
BUN: 49 mg/dL — ABNORMAL HIGH (ref 8–23)
CO2: 26 mmol/L (ref 22–32)
Calcium: 9.8 mg/dL (ref 8.9–10.3)
Chloride: 99 mmol/L (ref 98–111)
Creatinine, Ser: 1.08 mg/dL (ref 0.61–1.24)
GFR, Estimated: >60 mL/min (ref >60)
Glucose, Bld: 86 mg/dL (ref 70–99)
Phosphorus: 2.4 mg/dL — ABNORMAL LOW (ref 2.5–4.6)
Potassium: 3 mmol/L — ABNORMAL LOW (ref 3.5–5.1)
Sodium: 134 mmol/L — ABNORMAL LOW (ref 135–145)

## 2023-09-02 LAB — MAGNESIUM: Magnesium: 1.8 mg/dL (ref 1.7–2.4)

## 2023-09-02 LAB — LACTATE DEHYDROGENASE: LDH: 88 U/L — ABNORMAL LOW (ref 98–192)

## 2023-09-02 MED ORDER — LIDOCAINE-EPINEPHRINE 1 %-1:100000 IJ SOLN
INTRAMUSCULAR | Status: AC
  Filled 2023-09-02: qty 1

## 2023-09-02 MED ORDER — ALLOPURINOL 300 MG PO TABS
300.0000 mg | ORAL_TABLET | Freq: Every day | ORAL | Status: DC
  Administered 2023-09-02 – 2023-09-03 (×2): 300 mg via ORAL
  Filled 2023-09-02 (×2): qty 1

## 2023-09-02 MED ORDER — BRIMONIDINE TARTRATE 0.2 % OP SOLN
1.0000 [drp] | Freq: Two times a day (BID) | OPHTHALMIC | Status: DC
  Administered 2023-09-02 – 2023-09-03 (×2): 1 [drp] via OPHTHALMIC
  Filled 2023-09-02: qty 5

## 2023-09-02 MED ORDER — MIDAZOLAM HCL 2 MG/2ML IJ SOLN
INTRAMUSCULAR | Status: AC
  Filled 2023-09-02: qty 4

## 2023-09-02 MED ORDER — POTASSIUM CHLORIDE CRYS ER 20 MEQ PO TBCR
40.0000 meq | EXTENDED_RELEASE_TABLET | ORAL | Status: AC
  Administered 2023-09-02: 40 meq via ORAL
  Filled 2023-09-02: qty 2

## 2023-09-02 MED ORDER — FENTANYL CITRATE (PF) 100 MCG/2ML IJ SOLN
INTRAMUSCULAR | Status: AC
Start: 2023-09-02 — End: ?
  Filled 2023-09-02: qty 2

## 2023-09-02 MED ORDER — LATANOPROST 0.005 % OP SOLN
1.0000 [drp] | Freq: Every day | OPHTHALMIC | Status: DC
  Administered 2023-09-02: 1 [drp] via OPHTHALMIC
  Filled 2023-09-02: qty 2.5

## 2023-09-02 MED ORDER — LIDOCAINE HCL 1 % IJ SOLN
20.0000 mL | Freq: Once | INTRAMUSCULAR | Status: AC
  Administered 2023-09-02: 20 mL via INTRADERMAL
  Filled 2023-09-02: qty 20

## 2023-09-02 MED ORDER — DEXTROSE 5 % IV SOLN
20.0000 mg/m2 | Freq: Once | INTRAVENOUS | Status: AC
  Administered 2023-09-02: 40 mg via INTRAVENOUS
  Filled 2023-09-02: qty 5
  Filled 2023-09-02: qty 20

## 2023-09-02 MED ORDER — FENTANYL CITRATE (PF) 100 MCG/2ML IJ SOLN
INTRAMUSCULAR | Status: AC | PRN
Start: 2023-09-02 — End: 2023-09-02
  Administered 2023-09-02: 50 ug via INTRAVENOUS
  Administered 2023-09-02: 25 ug via INTRAVENOUS

## 2023-09-02 MED ORDER — CYCLOPHOSPHAMIDE CHEMO INJECTION 1 GM
500.0000 mg | Freq: Once | INTRAMUSCULAR | Status: AC
  Administered 2023-09-02: 500 mg via INTRAVENOUS
  Filled 2023-09-02: qty 25

## 2023-09-02 MED ORDER — SODIUM CHLORIDE 0.9 % IV SOLN
20.0000 mg | Freq: Once | INTRAVENOUS | Status: AC
  Administered 2023-09-02: 20 mg via INTRAVENOUS
  Filled 2023-09-02: qty 2

## 2023-09-02 MED ORDER — MIDAZOLAM HCL 2 MG/2ML IJ SOLN
INTRAMUSCULAR | Status: AC | PRN
  Administered 2023-09-02 (×2): .5 mg via INTRAVENOUS
  Administered 2023-09-02: 1 mg via INTRAVENOUS

## 2023-09-02 NOTE — Progress Notes (Signed)
 [facility] Progress Note  Subjective:  Ca++ down to 9.8 this am No new c/o's Back pain ongoing off and on  Vitals:   09/01/23 0710 09/01/23 1324 09/01/23 2016 09/02/23 0550  BP:  (!) 145/73 (!) 145/72 134/78  Pulse:  79 75 (!) 59  Resp:  20 16 18   Temp:  98.1 F (36.7 C) 98 F (36.7 C) 97.6 F (36.4 C)  TempSrc:  Oral Oral Oral  SpO2:  98% 97% 98%  Weight: 93.3 kg     Height:        Exam: Gen alert, [place] O2 in place, appropriate No jvd or bruits Chest clear bilat to bases RRR no MRG Abd soft ntnd no mass or ascites +bs GU nl male, condom cath draining mostly clear urine Ext no LE edema Neuro as above    Renal-related home meds: Calcitonin 200 unit 1 spray daily Decadron as directed Lasix 40mg  qam and 20mg  qpm Revlimid 10mg  as directed MS contin Oxy IR PPI Lyrica    CXR 3/30 - IMPRESSION: Mild bibasilar subsegmental atelectasis with possible minimal pleural effusions.   Assessment/ Plan: Hypercalcemia: Ca++ in the 13s here. Recurrent issue for this pt w/ multiple myeloma. Renal function wnl. Has been getting IVF"s 150-200 cc/hr, IV lasix, calcitonin SQ x 4 and IV decadron. S/P 1 dose of zometa. Ca++ down today to 9.8. No further suggestions, will sign off. Will defer further IVF"s/ IV lasix to oncology from here.  Hypernatremia: resolved Multiple myeloma: myeloma markers worsening, per ONC pt is interested to pursue a more palliative treatment. To start on 09/02/23.        Daniel Moselle MD  [facility] 09/02/2023, 8:09 AM  Recent Labs  Lab 09/01/23 0981 09/01/23 0614 09/02/23 0601  HGB 8.4*  --  8.9*  ALBUMIN 2.5* 2.4* 2.7*  CALCIUM 11.1* 11.2* 9.8  PHOS 2.8 2.9 2.4*  CREATININE 0.94 0.96 1.08  K 3.3* 3.3* 3.0*   No results for input(s): "IRON", "TIBC", "FERRITIN" in the last 168 hours. Inpatient medications:  acyclovir  400 mg Oral Daily   apixaban  5 mg Oral BID   carfilzomib  20 mg/m2 (Capped) Intravenous Once   cyclophosphamide  500 mg  Intravenous Once   [START ON 09/04/2023] dexamethasone  8 mg Oral Daily   docusate sodium  300 mg Oral Daily   furosemide  20 mg Intravenous Q12H   loratadine  10 mg Oral Daily   magnesium citrate  1 Bottle Oral Once   morphine  15 mg Oral QHS   pantoprazole  40 mg Oral QAC breakfast   polyethylene glycol  17 g Oral BID   pregabalin  100 mg Oral Daily   pregabalin  200 mg Oral QHS    sodium chloride 175 mL/hr at 09/02/23 0019   dexamethasone (DECADRON) IVPB ([facility])      acetaminophen **OR** acetaminophen, albuterol, cyclobenzaprine, HYDROmorphone (DILAUDID) injection, ondansetron **OR** ondansetron (ZOFRAN) IV, mouth rinse, oxyCODONE, traZODone

## 2023-09-02 NOTE — Progress Notes (Signed)
 Daniel Whitaker   DOB:Aug 08, 1945   VF#:[id number]    ASSESSMENT & PLAN:  Multiple myeloma not having achieved remission (HCC) He has IgA kappa multiple myeloma with multiple bone fractures at presentation in 2024.  Bone marrow biopsy showed 15% involvement, standard risk myeloma FISH and cytogenetics He had initial good partial response with combination chemotherapy with daratumumab, lenalidomide, bortezomib and dexamethasone Since recurrent hospitalization in February and March and interruption of treatment, he is now showing signs of disease progression with elevated serum total protein and reduce albumin Will repeat myeloma panel from 08/31/23 is pending   Overall, he has very very poor prognosis as he is progressing through first line of quadruple chemotherapy Patient is interested to pursue more palliative treatment  I have received approval to start palliative treatment with carfilzomib, Cytoxan and dexamethasone starting on 09/02/2023 Due to need for long-term IV access, he is scheduled for port placement today We discussed risk, benefits, side effects of treatment and ultimately he agreed to proceed He will continue eliquis for DVT prophylaxis He will continue vitamin D supplement He is on acyclovir for antimicrobial prophylaxis  Serum LDH and uric acid is not high There is potential risk of tumor lysis and so I will start him on allopurinol  The risks of not treating him would be risk of death He will need to be here at least until Friday to complete 2 days of chemotherapy, or longer if needed if he still have persistent hypercalcemia   Deficiency anemia Multifactorial, due to myeloma, recent treatment and hemodilution This is stable Monitor closely He would be at high risk for transfusion needs Hemoglobin stable, no blood transfusion is needed   Hypercalcemia, resolved He has recurrent malignant hypercalcemia again He has failed outpatient therapy and is now being  admitted  Will continue corticosteroids, furosemide, hydration and received 1 dose of Zometa on 08/31/2023 I will reduce IV fluids, okay to discontinue calcitonin We will continue to monitor   Cancer related pain He has recent exacerbation of bone pain due to steroid withdrawal Pain control is improved reintroduction of dexamethasone and MS Contin at night He will continue oxycodone as needed along with acetaminophen He appears somewhat sedated likely due to side effects of hypercalcemia   Altered mental status, improving He is noted to have slow mentation likely due to hypercalcemia Multiple family members are by the bedside and agree with the plan of care   Severe constipation, resolved Multifactorial, likely due to immobility, side effects of opiates and hypercalcemia Continue laxatives   Discharge planning I am hopeful he can be discharged tomorrow  [name], MD 09/02/2023 9:12 AM  Subjective:  He is sitting on the chair, family by bedside.  He felt that his pain is slightly better controlled.  He had large bowel movement yesterday.  Negative fluid balance.  He is n.p.o. for port placement.  I checked with nursing staff, plan to administer chemotherapy today and tomorrow  Objective:  Vitals:   09/01/23 2016 09/02/23 0550  BP: (!) 145/72 134/78  Pulse: 75 (!) 59  Resp: 16 18  Temp: 98 F (36.7 C) 97.6 F (36.4 C)  SpO2: 97% 98%     Intake/Output Summary (Last 24 hours) at 09/02/2023 0912 Last data filed at 09/02/2023 8416 Gross per 24 hour  Intake 800.42 ml  Output 3150 ml  Net -2349.58 ml   Limited physical examination is performed.  Very mild bilateral crackles on lung exam at the bases

## 2023-09-02 NOTE — Progress Notes (Signed)
 PT Cancellation Note  Patient Details Name: Daniel Whitaker MRN: [id number] DOB: [birth year]   Cancelled Treatment:    Reason Eval/Treat Not Completed: Patient at procedure or test/unavailable (IR)   Kati L Payson 09/02/2023, 3:30 PM

## 2023-09-02 NOTE — Progress Notes (Signed)
   09/02/23 2136  BiPAP/CPAP/SIPAP  BiPAP/CPAP/SIPAP Pt Type Adult (Patient using home equipment and prefers self placement when ready)  BiPAP/CPAP/SIPAP Resmed  Mask Type Nasal pillows  Dentures removed? Not applicable  Flow Rate 4 lpm (bled into circuit)  Heater Temperature  (sterile water provided to spouse for humidifier)  Patient Home Machine Yes  Safety Check Completed by RT for Home Unit Yes, no issues noted  Patient Home Mask Yes  Patient Home Tubing Yes  Auto Titrate Yes  Device Plugged into [employer] Power Outlet Yes

## 2023-09-02 NOTE — Progress Notes (Signed)
 PROGRESS NOTE    Daniel Whitaker  NFA:[id number] DOB: 05-30-1946 DOA: 08/30/2023 PCP: [name], MD   Brief Narrative:  78 yrs old Male with PMH significant for multiple myeloma with bony involvement undergoing active chemotherapy along with hypercalcemia being treated as an outpatient by oncology presented  with confusion, lethargy and muscle spasms.  He was found to have persistent hypercalcemia with calcium level of 13 and was directly admitted to the hospital from oncology clinic.  Oncology and nephrology following.  Assessment & Plan:   Symptomatic malignant hypercalcemia History of multiple myeloma with bony metastases -Oncology and nephrology following.  Calcium 9.8 today.  Monitor.  IV fluids and diuretics as per nephrology.  Also on dexamethasone -Patient has received Zometa and calcitonin -Oncology planning to continue inpatient chemotherapy.  IR has been consulted for port placement.  Hypokalemia -Replace.  Repeat a.m. labs  Hypomagnesemia -Improved  Hyponatremia Continue IV fluids.  Monitor.  Paroxysmal A-fib -Continue Eliquis.  Currently rate controlled  Chronic pain syndrome with opiate dependence -continue current pain management with MS Contin along with as needed oxycodone and IV Dilaudid.  Continue Lyrica.  Continue bowel regimen  Anemia of chronic disease -From chronic illnesses.  Hemoglobin currently stable.  Monitor intermittently  Acute metabolic encephalopathy -Possibly from hypercalcemia.  Monitor mental status.  Physical deconditioning -PT eval    DVT prophylaxis: Eliquis Code Status: Full Family Communication: family members at bedside Disposition Plan: Status is: Inpatient Remains inpatient appropriate because: Of severity of illness    Consultants: Oncology/nephrology  Procedures: None  Antimicrobials:  Anti-infectives (From admission, onward)    Start     Dose/Rate Route Frequency Ordered Stop   08/31/23 1000  acyclovir  (ZOVIRAX) tablet 400 mg        400 mg Oral Daily 08/30/23 1149          Subjective: Patient seen and examined at bedside.  Complains of intermittent back pain.  No fever, chest pain or shortness of breath reported. Objective: Vitals:   09/01/23 0710 09/01/23 1324 09/01/23 2016 09/02/23 0550  BP:  (!) 145/73 (!) 145/72 134/78  Pulse:  79 75 (!) 59  Resp:  20 16 18   Temp:  98.1 F (36.7 C) 98 F (36.7 C) 97.6 F (36.4 C)  TempSrc:  Oral Oral Oral  SpO2:  98% 97% 98%  Weight: 93.3 kg     Height:        Intake/Output Summary (Last 24 hours) at 09/02/2023 0822 Last data filed at 09/02/2023 4696 Gross per 24 hour  Intake 800.42 ml  Output 3200 ml  Net -2399.58 ml   Filed Weights   08/30/23 1900 09/01/23 0710  Weight: 96.5 kg 93.3 kg    Examination:  General: On room air.  No distress.  Chronically ill and deconditioned looking. ENT/neck: No thyromegaly.  JVD is not elevated  respiratory: Decreased breath sounds at bases bilaterally with some crackles; no wheezing  CVS: S1-S2 heard, rate controlled currently Abdominal: Soft, nontender, slightly distended; no organomegaly, normal bowel sounds are heard Extremities: Trace lower extremity edema; no cyanosis  CNS: More awake and answering some questions.  No focal neurologic deficit.  Moves extremities Lymph: No obvious lymphadenopathy Skin: No obvious petechiae/rashes psych: Not agitated currently.  Flat affect mostly. Musculoskeletal: No obvious joint swelling/deformity     Data Reviewed: I have personally reviewed following labs and imaging studies  CBC: Recent Labs  Lab 08/27/23 2952 08/29/23 1336 08/29/23 1352 08/30/23 8413 08/31/23 2440 09/01/23 1027 09/02/23 0601  WBC 13.1* 9.2  --  8.7 7.5 6.6 6.1  NEUTROABS 10.8* 7.9*  --  6.7  --   --  4.7  HGB 9.7* 9.1* 9.5* 9.4* 8.7* 8.4* 8.9*  HCT 30.4* 29.6* 28.0* 30.7* 28.3* 27.5* 28.9*  MCV 85.2 88.9  --  88.5 89.3 90.8 89.5  PLT 311 260  --  289 255 217 234    Basic Metabolic Panel: Recent Labs  Lab 08/29/23 1336 08/29/23 1352 08/30/23 0841 08/30/23 1900 08/31/23 0632 08/31/23 1252 08/31/23 2049 09/01/23 0613 09/01/23 0614 09/02/23 0601  NA 138   < > 140 141 142  --   --  138 138 134*  K 3.1*   < > 3.1* 2.9* 2.6*  --   --  3.3* 3.3* 3.0*  CL 100   < > 100 99 99  --   --  101 101 99  CO2 27  --  31 29 30   --   --  26 25 26   GLUCOSE 139*   < > 114* 162* 90  --   --  97 97 86  BUN 47*   < > 44* 46* 40*  --   --  42* 43* 49*  CREATININE 1.16   < > 1.15 1.21 1.07  --   --  0.94 0.96 1.08  CALCIUM 12.7*  --  13.0* 11.9* 11.8* 12.0* 11.7* 11.1* 11.2* 9.8  MG 1.7  --  1.4*  --   --   --   --  1.4*  --  1.8  PHOS  --   --   --   --   --   --   --  2.8 2.9 2.4*   < > = values in this interval not displayed.   GFR: Estimated Creatinine Clearance (by C-G formula based on SCr of 1.08 mg/dL) Male: 16.1 mL/min Male: 68 mL/min Liver Function Tests: Recent Labs  Lab 08/27/23 0937 08/29/23 1336 08/30/23 0841 08/31/23 0632 09/01/23 0613 09/01/23 0614 09/02/23 0601  AST 12* 13* 9*  --  10*  --   --   ALT 9 11 8   --  9  --   --   ALKPHOS 78 66 68  --  53  --   --   BILITOT 0.4 0.4 0.4  --  0.5  --   --   PROT 9.8* 9.1* 9.7*  --  8.1  --   --   ALBUMIN 3.3* 2.6* 3.2* 2.6* 2.5* 2.4* 2.7*   No results for input(s): "LIPASE", "AMYLASE" in the last 168 hours. No results for input(s): "AMMONIA" in the last 168 hours. Coagulation Profile: No results for input(s): "INR", "PROTIME" in the last 168 hours. Cardiac Enzymes: No results for input(s): "CKTOTAL", "CKMB", "CKMBINDEX", "TROPONINI" in the last 168 hours. BNP (last 3 results) No results for input(s): "PROBNP" in the last 8760 hours. HbA1C: No results for input(s): "HGBA1C" in the last 72 hours. CBG: Recent Labs  Lab 08/29/23 1322  GLUCAP 130*   Lipid Profile: No results for input(s): "CHOL", "HDL", "LDLCALC", "TRIG", "CHOLHDL", "LDLDIRECT" in the last 72 hours. Thyroid  Function Tests: No results for input(s): "TSH", "T4TOTAL", "FREET4", "T3FREE", "THYROIDAB" in the last 72 hours. Anemia Panel: No results for input(s): "VITAMINB12", "FOLATE", "FERRITIN", "TIBC", "IRON", "RETICCTPCT" in the last 72 hours. Sepsis Labs: No results for input(s): "PROCALCITON", "LATICACIDVEN" in the last 168 hours.  Recent Results (from the past 240 hours)  Resp panel by RT-PCR (RSV, Flu A&B, Covid) Anterior Nasal  Swab     Status: None   Collection Time: 08/29/23  1:36 PM   Specimen: Anterior Nasal Swab  Result Value Ref Range Status   SARS Coronavirus 2 by RT PCR NEGATIVE NEGATIVE Final    Comment: (NOTE) SARS-CoV-2 target nucleic acids are NOT DETECTED.  The SARS-CoV-2 RNA is generally detectable in upper respiratory specimens during the acute phase of infection. The lowest concentration of SARS-CoV-2 viral copies this assay can detect is 138 copies/mL. A negative result does not preclude SARS-Cov-2 infection and should not be used as the sole basis for treatment or other patient management decisions. A negative result may occur with  improper specimen collection/handling, submission of specimen other than nasopharyngeal swab, presence of viral mutation(s) within the areas targeted by this assay, and inadequate number of viral copies(<138 copies/mL). A negative result must be combined with clinical observations, patient history, and epidemiological information. The expected result is Negative.  Fact Sheet for Patients:  BloggerCourse.com  Fact Sheet for Healthcare Providers:  SeriousBroker.it  This test is no t yet approved or cleared by the [place] FDA and  has been authorized for detection and/or diagnosis of SARS-CoV-2 by FDA under an Emergency Use Authorization (EUA). This EUA will remain  in effect (meaning this test can be used) for the duration of the COVID-19 declaration under Section 564(b)(1) of the  Act, 21 U.S.C.section 360bbb-3(b)(1), unless the authorization is terminated  or revoked sooner.       Influenza A by PCR NEGATIVE NEGATIVE Final   Influenza B by PCR NEGATIVE NEGATIVE Final    Comment: (NOTE) The Xpert Xpress SARS-CoV-2/FLU/RSV plus assay is intended as an aid in the diagnosis of influenza from Nasopharyngeal swab specimens and should not be used as a sole basis for treatment. Nasal washings and aspirates are unacceptable for Xpert Xpress SARS-CoV-2/FLU/RSV testing.  Fact Sheet for Patients: BloggerCourse.com  Fact Sheet for Healthcare Providers: SeriousBroker.it  This test is not yet approved or cleared by the [place] FDA and has been authorized for detection and/or diagnosis of SARS-CoV-2 by FDA under an Emergency Use Authorization (EUA). This EUA will remain in effect (meaning this test can be used) for the duration of the COVID-19 declaration under Section 564(b)(1) of the Act, 21 U.S.C. section 360bbb-3(b)(1), unless the authorization is terminated or revoked.     Resp Syncytial Virus by PCR NEGATIVE NEGATIVE Final    Comment: (NOTE) Fact Sheet for Patients: BloggerCourse.com  Fact Sheet for Healthcare Providers: SeriousBroker.it  This test is not yet approved or cleared by the [place] FDA and has been authorized for detection and/or diagnosis of SARS-CoV-2 by FDA under an Emergency Use Authorization (EUA). This EUA will remain in effect (meaning this test can be used) for the duration of the COVID-19 declaration under Section 564(b)(1) of the Act, 21 U.S.C. section 360bbb-3(b)(1), unless the authorization is terminated or revoked.  Performed at [facility], [address] [place], [place] [address]          Radiology Studies: No results found.      Scheduled Meds:  acyclovir  400 mg Oral Daily    allopurinol  300 mg Oral Daily   apixaban  5 mg Oral BID   carfilzomib  20 mg/m2 (Capped) Intravenous Once   cyclophosphamide  500 mg Intravenous Once   [START ON 09/04/2023] dexamethasone  8 mg Oral Daily   docusate sodium  300 mg Oral Daily   furosemide  20 mg Intravenous Q12H  loratadine  10 mg Oral Daily   magnesium citrate  1 Bottle Oral Once   morphine  15 mg Oral QHS   pantoprazole  40 mg Oral QAC breakfast   polyethylene glycol  17 g Oral BID   pregabalin  100 mg Oral Daily   pregabalin  200 mg Oral QHS   Continuous Infusions:  sodium chloride 175 mL/hr at 09/02/23 0019   dexamethasone (DECADRON) IVPB ([facility])            Glade Lloyd, MD [facility] 09/02/2023, 8:22 AM

## 2023-09-02 NOTE — Procedures (Signed)
 Vascular and Interventional Radiology Procedure Note  Patient: Daniel Whitaker DOB: [birth year] Medical Record Number: [id number] Note Date/Time: 09/02/23 1:59 PM   Performing Physician: Roanna Banning, MD Assistant(s): None  Diagnosis:  MM  Procedure: PORT PLACEMENT  Anesthesia: Conscious Sedation Complications: None Estimated Blood Loss: Minimal  Findings:  Successful right-sided port placement, with the tip of the catheter in the proximal right atrium.  Plan: Catheter ready for use.  See detailed procedure note with images in PACS. The patient tolerated the procedure well without incident or complication and was returned to Recovery in stable condition.    Roanna Banning, MD [facility] Rad[facility]   Pager. [phone] Clinic. [phone]

## 2023-09-03 ENCOUNTER — Inpatient Hospital Stay

## 2023-09-03 ENCOUNTER — Inpatient Hospital Stay: Attending: Hematology and Oncology

## 2023-09-03 ENCOUNTER — Inpatient Hospital Stay: Admitting: Hematology and Oncology

## 2023-09-03 DIAGNOSIS — G893 Neoplasm related pain (acute) (chronic): Secondary | ICD-10-CM | POA: Diagnosis not present

## 2023-09-03 DIAGNOSIS — C9000 Multiple myeloma not having achieved remission: Secondary | ICD-10-CM | POA: Diagnosis not present

## 2023-09-03 DIAGNOSIS — D649 Anemia, unspecified: Secondary | ICD-10-CM | POA: Diagnosis not present

## 2023-09-03 LAB — CBC WITH DIFFERENTIAL/PLATELET
Abs Immature Granulocytes: 0.03 10*3/uL (ref 0.00–0.07)
Basophils Absolute: 0 10*3/uL (ref 0.0–0.1)
Basophils Relative: 0 %
Eosinophils Absolute: 0 10*3/uL (ref 0.0–0.5)
Eosinophils Relative: 0 %
HCT: 25.6 % — ABNORMAL LOW (ref 39.0–52.0)
Hemoglobin: 7.8 g/dL — ABNORMAL LOW (ref 13.0–17.0)
Immature Granulocytes: 0 %
Lymphocytes Relative: 4 %
Lymphs Abs: 0.3 10*3/uL — ABNORMAL LOW (ref 0.7–4.0)
MCH: 27.3 pg (ref 26.0–34.0)
MCHC: 30.5 g/dL (ref 30.0–36.0)
MCV: 89.5 fL (ref 80.0–100.0)
Monocytes Absolute: 0.3 10*3/uL (ref 0.1–1.0)
Monocytes Relative: 4 %
Neutro Abs: 6.4 10*3/uL (ref 1.7–7.7)
Neutrophils Relative %: 92 %
Platelets: 230 10*3/uL (ref 150–400)
RBC: 2.86 MIL/uL — ABNORMAL LOW (ref 4.22–5.81)
RDW: 17.1 % — ABNORMAL HIGH (ref 11.5–15.5)
WBC: 7 10*3/uL (ref 4.0–10.5)
nRBC: 0 % (ref 0.0–0.2)

## 2023-09-03 LAB — RENAL FUNCTION PANEL
Albumin: 2.3 g/dL — ABNORMAL LOW (ref 3.5–5.0)
Anion gap: 8 (ref 5–15)
BUN: 42 mg/dL — ABNORMAL HIGH (ref 8–23)
CO2: 26 mmol/L (ref 22–32)
Calcium: 8.6 mg/dL — ABNORMAL LOW (ref 8.9–10.3)
Chloride: 101 mmol/L (ref 98–111)
Creatinine, Ser: 0.93 mg/dL (ref 0.61–1.24)
GFR, Estimated: >60 mL/min (ref >60)
Glucose, Bld: 117 mg/dL — ABNORMAL HIGH (ref 70–99)
Phosphorus: 2.5 mg/dL (ref 2.5–4.6)
Potassium: 3.7 mmol/L (ref 3.5–5.1)
Sodium: 135 mmol/L (ref 135–145)

## 2023-09-03 LAB — LACTATE DEHYDROGENASE: LDH: 95 U/L — ABNORMAL LOW (ref 98–192)

## 2023-09-03 LAB — PREPARE RBC (CROSSMATCH)

## 2023-09-03 LAB — MAGNESIUM: Magnesium: 1.7 mg/dL (ref 1.7–2.4)

## 2023-09-03 LAB — URIC ACID: Uric Acid, Serum: 6.2 mg/dL (ref 3.7–8.6)

## 2023-09-03 MED ORDER — FUROSEMIDE 10 MG/ML IJ SOLN
20.0000 mg | Freq: Once | INTRAMUSCULAR | Status: AC
  Administered 2023-09-03: 20 mg via INTRAVENOUS
  Filled 2023-09-03: qty 2

## 2023-09-03 MED ORDER — SODIUM CHLORIDE 0.9% FLUSH: 10.0000 mL | INTRAVENOUS | Status: DC | PRN

## 2023-09-03 MED ORDER — DEXTROSE 5 % IV SOLN
20.0000 mg/m2 | Freq: Once | INTRAVENOUS | Status: AC
  Administered 2023-09-03: 40 mg via INTRAVENOUS
  Filled 2023-09-03: qty 15

## 2023-09-03 MED ORDER — TRIAMCINOLONE ACETONIDE 55 MCG/ACT NA AERO: 1.0000 | INHALATION_SPRAY | Freq: Every day | NASAL | Status: DC | PRN

## 2023-09-03 MED ORDER — DEXAMETHASONE 4 MG PO TABS
8.0000 mg | ORAL_TABLET | ORAL | Status: DC
Start: 2023-09-03 — End: 2023-09-22

## 2023-09-03 MED ORDER — CHLORHEXIDINE GLUCONATE CLOTH 2 % EX PADS
6.0000 | MEDICATED_PAD | Freq: Every day | CUTANEOUS | Status: DC
  Administered 2023-09-03: 6 via TOPICAL

## 2023-09-03 MED ORDER — SODIUM CHLORIDE 0.9% IV SOLUTION: Freq: Once | INTRAVENOUS | Status: AC

## 2023-09-03 MED ORDER — PROCHLORPERAZINE MALEATE 10 MG PO TABS
10.0000 mg | ORAL_TABLET | Freq: Once | ORAL | Status: AC
Start: 2023-09-03 — End: 2023-09-03
  Administered 2023-09-03: 10 mg via ORAL
  Filled 2023-09-03: qty 1

## 2023-09-03 MED ORDER — HEPARIN SOD (PORK) LOCK FLUSH 100 UNIT/ML IV SOLN
500.0000 [IU] | Freq: Once | INTRAVENOUS | Status: AC
  Administered 2023-09-03: 500 [IU] via INTRAVENOUS
  Filled 2023-09-03: qty 5

## 2023-09-03 MED ORDER — SODIUM CHLORIDE 0.9% FLUSH
10.0000 mL | Freq: Two times a day (BID) | INTRAVENOUS | Status: DC
  Administered 2023-09-03 (×2): 10 mL

## 2023-09-03 MED ORDER — ALLOPURINOL 300 MG PO TABS: 300.0000 mg | ORAL_TABLET | Freq: Every day | ORAL | 0 refills | Status: DC

## 2023-09-03 MED ORDER — ACETAMINOPHEN 325 MG PO TABS
650.0000 mg | ORAL_TABLET | Freq: Once | ORAL | Status: AC
  Administered 2023-09-03: 650 mg via ORAL
  Filled 2023-09-03: qty 2

## 2023-09-03 NOTE — Progress Notes (Signed)
 Daniel Whitaker   DOB:1946/01/18   BJ#:[id number]    ASSESSMENT & PLAN:  Multiple myeloma not having achieved remission (HCC) He has IgA kappa multiple myeloma with multiple bone fractures at presentation in 2024.  Bone marrow biopsy showed 15% involvement, standard risk myeloma FISH and cytogenetics I have received approval to start palliative treatment with carfilzomib, Cytoxan and dexamethasone starting on 09/02/2023, second dose today Has no side effect so far No signs of tumor lysis syndrome Continue allopurinol for 30 days He will continue eliquis for DVT prophylaxis He will continue vitamin D supplement He is on acyclovir for antimicrobial prophylaxis  Deficiency anemia Multifactorial, due to myeloma, recent treatment and hemodilution We discussed some of the risks, benefits, and alternatives of blood transfusions. The patient is symptomatic from anemia and the hemoglobin level is critically low.  Some of the side-effects to be expected including risks of transfusion reactions, chills, infection, syndrome of volume overload and risk of hospitalization from various reasons and the patient is willing to proceed and went ahead to sign consent today. He will receive a unit of blood prior to discharge  Hypercalcemia, resolved He has recurrent malignant hypercalcemia again He has failed outpatient therapy and is now being admitted   Will continue corticosteroids, furosemide, hydration and received 1 dose of Zometa on 08/31/2023 Calcium level is down He will complete IV fluids this morning We will continue to monitor   Cancer related pain Improve control with high-dose steroids Continue his current pain medicine regimen   Severe constipation, resolved Multifactorial, likely due to immobility, side effects of opiates and hypercalcemia Continue laxatives   Discharge planning He can be discharged today after completion of treatment and blood transfusion He has appointment to see me next  week Plan of care is fully discussed with family and primary service  [name], MD 09/03/2023 9:26 AM  Subjective:  He is feeling better today.  Denies side effects from treatment such as nausea.  He denies constipation.  Pain is well-controlled.  He has good appetite.  He is ready to be discharged after treatment  Objective:  Vitals:   09/02/23 1515 09/02/23 2101  BP: (!) 130/54 124/76  Pulse: 72 88  Resp: 16   Temp:  97.8 F (36.6 C)  SpO2: 97% 97%     Intake/Output Summary (Last 24 hours) at 09/03/2023 0926 Last data filed at 09/03/2023 3086 Gross per 24 hour  Intake --  Output 2150 ml  Net -2150 ml

## 2023-09-03 NOTE — Progress Notes (Signed)
 PT Cancellation Note  Patient Details Name: Daniel Whitaker MRN: [id number] DOB: 27-Nov-1945   Cancelled Treatment:    Reason Eval/Treat Not Completed: PT screened, no needs identified, will sign off  Spoke with RN who reports pt going home after he gets a unit of blood but PT could check on pt.  Spoke with pt who reports no PT needs.  States he is leaving shortly and has been mobilizing in room.  He is on O2 at home, has DME, and already is current with outpt PT which he plans to continue.  Will sign off.  Should anything change, please reconsult PT if indicated.   Anise Salvo, PT Acute Rehab [facility] [phone]  Rayetta Humphrey 09/03/2023, 11:50 AM

## 2023-09-03 NOTE — Care Management Important Message (Signed)
 Important Message  Patient Details IM Letter was given to the Patient. Name: Daniel Whitaker MRN: [id number] Date of Birth: [birth year]   Important Message Given:  Yes - Medicare IM     Caren Macadam 09/03/2023, 2:31 PM

## 2023-09-03 NOTE — Progress Notes (Signed)
 Ok to proceed with treatment today with Hgb 7.8, as pt will receive a blood transfusion per Dr. Bertis Ruddy.  Drusilla Kanner, PharmD, MBA

## 2023-09-03 NOTE — Discharge Summary (Signed)
 Physician Discharge Summary  BOYDE GRIECO FAO:[id number] DOB: 1945-07-25 DOA: 08/30/2023  PCP: [name], MD  Admit date: 08/30/2023 Discharge date: 09/03/2023  Admitted From: Home Disposition: Home  Recommendations for Outpatient Follow-up:  Follow up with PCP in 1 week with repeat CBC/BMP Outpatient follow-up with oncology Follow up in ED if symptoms worsen or new appear   Home Health: No Equipment/Devices: None  Discharge Condition: Stable CODE STATUS: Full Diet recommendation: Regular  Brief/Interim Summary: 78 yrs old Male with PMH significant for multiple myeloma with bony involvement undergoing active chemotherapy along with hypercalcemia being treated as an outpatient by oncology presented  with confusion, lethargy and muscle spasms.  He was found to have persistent hypercalcemia with calcium level of 13 and was directly admitted to the hospital from oncology clinic.  Oncology and nephrology following.  During the hospitalization, his condition has improved.  His calcium has normalized and nephrology has signed off.  He received chemotherapy during the hospitalization.  Oncology has cleared him for discharge today.  He will be discharged home today with outpatient follow-up with PCP and oncology.  Discharge Diagnoses:   Symptomatic malignant hypercalcemia History of multiple myeloma with bony metastases -Oncology and nephrology followed the patient during the hospitalization.  Treated with IV fluids and diuretics as per nephrology and oncology.  Also on dexamethasone -Patient has received Zometa and calcitonin - Patient underwent port placement by IR on 09/02/2023 and subsequently received chemotherapy as per oncology. -Calcium level has normalized.  Nephrology has signed off. -Oncology has cleared him for discharge today.  He will be discharged home today with outpatient follow-up with PCP and oncology.  Patient has been started on allopurinol by oncology which will be  continued.  Hypokalemia - Improved   hypomagnesemia -Improved   Hyponatremia -Treated with IV fluids.  Resolved.   Paroxysmal A-fib -Continue Eliquis.  Currently rate controlled   Chronic pain syndrome with opiate dependence -continue current pain management with MS Contin along with as needed oxycodone.  Continue Lyrica.  Continue bowel regimen.  Outpatient follow-up.   Anemia of chronic disease -From chronic illnesses.  Hemoglobin 7.8 this morning and patient is being transfused 1 unit packed red cells as per oncology recommendations.  Outpatient follow-up of CBC.    Acute metabolic encephalopathy -Possibly from hypercalcemia.  Mental status has much improved and probably is back to baseline now.  Discharge Instructions  Discharge Instructions     Diet - low sodium heart healthy   Complete by: As directed    Increase activity slowly   Complete by: As directed    No wound care   Complete by: As directed    [facility] PHYSICIAN COMMUNICATION 6   Complete by: As directed    Carfilzomib - monitor patients for clinical signs or symptoms of cardiac failure or cardiac ischemia. Evaluate promptly if cardiac toxicity is suspected. Withhold carfilzomib for Grade 3 or 4 cardiac adverse reactions until recovery and consider whether to restart carfilzomib at 1 dose level reduction based on a benefit/risk assessment.   [facility] PHYSICIAN COMMUNICATION 7   Complete by: As directed    Carfilzomib - In patients >= 78 years of age, the risk of cardiac failure is increased. Patients with New York Heart Association Class III and IV heart failure, recent myocardial infarction, conduction abnormalities, angina, or arrhythmias uncontrolled by medications may be at greater risk for cardiac complications. Please complete a comprehensive medical assessment (including blood pressure control and fluid management) prior to starting treatment with carfilzomib  and remain under close follow-up.   PHYSICIAN  COMMUNICATION ORDER   Complete by: As directed    Dexamethasone 40 mg IV to be given in clinic prior to Kyprolis.      Allergies as of 09/03/2023       Reactions   Atorvastatin Other (See Comments)   Made the hands ACHE   Cymbalta [duloxetine Hcl] Nausea Only, Other (See Comments)   Drowsiness, also   Penicillins Hives        Medication List     TAKE these medications    acyclovir 400 MG tablet Commonly known as: ZOVIRAX Take 1 tablet (400 mg total) by mouth daily.   albuterol 108 (90 Base) MCG/ACT inhaler Commonly known as: VENTOLIN HFA Inhale 1-2 puffs into the lungs every 6 (six) hours as needed for wheezing or shortness of breath.   allopurinol 300 MG tablet Commonly known as: ZYLOPRIM Take 1 tablet (300 mg total) by mouth daily. Start taking on: September 04, 2023   apixaban 5 MG Tabs tablet Commonly known as: ELIQUIS Take 1 tablet (5 mg total) by mouth 2 (two) times daily.   brimonidine 0.2 % ophthalmic solution Commonly known as: ALPHAGAN Place 1 drop into the left eye in the morning and at bedtime.   calcitonin (salmon) 200 UNIT/ACT nasal spray Commonly known as: MIACALCIN/FORTICAL Place 1 spray into alternate nostrils daily.   Centrum Silver 50+Men Tabs Take 1 tablet by mouth daily with breakfast.   cetirizine 10 MG tablet Commonly known as: ZYRTEC Take 10 mg by mouth every morning.   cyclobenzaprine 10 MG tablet Commonly known as: FLEXERIL Take 10 mg by mouth daily as needed for muscle spasms.   Darzalex Faspro 1800-30000 MG-UT/15ML Soln Generic drug: daratumumab-hyaluronidase-fihj Inject 1,800 mg into the skin once a week. Every friday   Denta 5000 Plus 1.1 % Crea dental cream Generic drug: sodium fluoride Place 1 Application onto teeth at bedtime.   dexamethasone 4 MG tablet Commonly known as: DECADRON Take 2-4 tablets (8-16 mg total) by mouth See admin instructions. Take 8mg  (2 tablets) by mouth daily except on Friday's. On Friday's, take  16mg  (4 tablets).   docusate sodium 100 MG capsule Commonly known as: COLACE Take 300 mg by mouth daily.   dorzolamide-timolol 2-0.5 % ophthalmic solution Commonly known as: COSOPT Place 1 drop into both eyes 2 (two) times daily.   furosemide 40 MG tablet Commonly known as: LASIX Take 1 tablet (40 mg) in the morning and 1/2 tablet (20 mg) in the afternoon   ketoconazole 2 % cream Commonly known as: NIZORAL Apply 1 application  topically daily as needed for irritation.   latanoprost 0.005 % ophthalmic solution Commonly known as: XALATAN Place 1 drop into both eyes at bedtime.   lidocaine 5 % ointment Commonly known as: XYLOCAINE Apply 1 Application topically 2 (two) times daily as needed.   lidocaine-prilocaine cream Commonly known as: EMLA Apply to affected area once   morphine 15 MG 12 hr tablet Commonly known as: MS CONTIN Take 1 tablet (15 mg total) by mouth at bedtime.   nitroGLYCERIN 0.4 MG SL tablet Commonly known as: Nitrostat Place 1 tablet (0.4 mg total) under the tongue every 5 (five) minutes as needed.   ondansetron 8 MG tablet Commonly known as: Zofran Take 1 tablet (8 mg total) by mouth every 8 (eight) hours as needed for nausea or vomiting.   Oxycodone HCl 10 MG Tabs Take 1 tablet (10 mg total) by mouth every 4 (four) hours.  pantoprazole 40 MG tablet Commonly known as: PROTONIX Take 40 mg by mouth daily before breakfast.   polyethylene glycol 17 g packet Commonly known as: MIRALAX / GLYCOLAX Take 17 g by mouth 2 (two) times daily.   pregabalin 100 MG capsule Commonly known as: LYRICA TAKE 1 CAPSULE BY MOUTH IN THE MORNING, AND AT NOON, AND 2 CAPSULES AT NIGHT What changed:  how much to take how to take this when to take this additional instructions   prochlorperazine 10 MG tablet Commonly known as: COMPAZINE Take 1 tablet (10 mg total) by mouth every 6 (six) hours as needed for nausea or vomiting.   rosuvastatin 40 MG tablet Commonly  known as: CRESTOR Take 40 mg by mouth in the morning and at bedtime.   senna-docusate 8.6-50 MG tablet Commonly known as: Senokot-S Take 2 tablets by mouth 2 (two) times daily. What changed:  when to take this reasons to take this   SSD 1 % cream Generic drug: silver sulfADIAZINE Apply 1 Application topically daily.   triamcinolone 55 MCG/ACT Aero nasal inhaler Commonly known as: NASACORT Place 1 spray into the nose daily as needed.   TYLENOL 500 MG tablet Generic drug: acetaminophen Take 1,000 mg by mouth every 6 (six) hours as needed for mild pain (pain score 1-3) or headache.        Allergies  Allergen Reactions   Atorvastatin Other (See Comments)    Made the hands ACHE   Cymbalta [Duloxetine Hcl] Nausea Only and Other (See Comments)    Drowsiness, also   Penicillins Hives    Consultations: Oncology/nephrology/IR   Procedures/Studies: IR IMAGING GUIDED PORT INSERTION Result Date: 09/02/2023 INDICATION: Multiple myeloma. EXAM: IMPLANTED PORT A CATH PLACEMENT WITH ULTRASOUND AND FLUOROSCOPIC GUIDANCE MEDICATIONS: None ANESTHESIA/SEDATION: Moderate (conscious) sedation was employed during this procedure. A total of Versed 2 mg and Fentanyl 100 mcg was administered intravenously. Moderate Sedation Time: 20 minutes. The patient's level of consciousness and vital signs were monitored continuously by radiology nursing throughout the procedure under my direct supervision. FLUOROSCOPY TIME:  Fluoroscopic dose; 1 mGy COMPLICATIONS: None immediate. PROCEDURE: The procedure, risks, benefits, and alternatives were explained to the patient. Questions regarding the procedure were encouraged and answered. The patient understands and consents to the procedure. The RIGHT neck and chest were prepped with chlorhexidine in a sterile fashion, and a sterile drape was applied covering the operative field. Maximum barrier sterile technique with sterile gowns and gloves were used for the procedure.  A timeout was performed prior to the initiation of the procedure. Local anesthesia was provided with 1% lidocaine with epinephrine. After creating a small venotomy incision, a micropuncture kit was utilized to access the internal jugular vein under direct, real-time ultrasound guidance. Ultrasound image documentation was performed. The microwire was kinked to measure appropriate catheter length. A subcutaneous port pocket was then created along the upper chest wall utilizing a combination of sharp and blunt dissection. The pocket was irrigated with sterile saline. A single lumen power injectable port was chosen for placement. The 8 Fr catheter was tunneled from the port pocket site to the venotomy incision. The port was placed in the pocket. The external catheter was trimmed to appropriate length. At the venotomy, an 8 Fr peel-away sheath was placed over a guidewire under fluoroscopic guidance. The catheter was then placed through the sheath and the sheath was removed. Final catheter positioning was confirmed and documented with a fluoroscopic spot radiograph. The port was accessed with a Huber needle, aspirated and flushed  with heparinized saline. The port pocket incision was closed with interrupted 3-0 Vicryl suture then Dermabond was applied, including at the venotomy incision. Dressings were placed. The patient tolerated the procedure well without immediate post procedural complication. IMPRESSION: Successful placement of a RIGHT internal jugular approach power injectable Port-A-Cath. The tip of the catheter is positioned within the proximal RIGHT atrium. The catheter is ready for immediate use. Roanna Banning, MD [facility] Radiology Electronically Signed   By: Roanna Banning M.D.   On: 09/02/2023 16:56   CT Head Wo Contrast Result Date: 08/29/2023 CLINICAL DATA:  Increasing confusion, hypercalcemia, multiple myeloma EXAM: CT HEAD WITHOUT CONTRAST TECHNIQUE:  Contiguous axial images were obtained from the base of the skull through the vertex without intravenous contrast. RADIATION DOSE REDUCTION: This exam was performed according to the departmental dose-optimization program which includes automated exposure control, adjustment of the mA and/or kV according to patient size and/or use of iterative reconstruction technique. COMPARISON:  10/16/2013, 04/22/2023 FINDINGS: Brain: No acute infarct or hemorrhage. Lateral ventricles and midline structures are unremarkable. No acute extra-axial fluid collections. No mass effect. Vascular: Atherosclerosis of the internal carotid arteries. No hyperdense vessel. Skull: Multiple new rounded lytic lesions are seen throughout the calvarium since the prior PET scan, consistent with progression of known multiple myeloma. There are no acute displaced fractures. Sinuses/Orbits: Left mastoid effusion. The paranasal sinuses are clear. Other: None. IMPRESSION: 1. No acute intracranial process. 2. Lytic lesions seen throughout the calvarium, increased since prior exam, consistent with progression of known multiple myeloma. 3. Left mastoid effusion. Electronically Signed   By: Sharlet Salina M.D.   On: 08/29/2023 15:41   DG Chest Port 1 View Result Date: 08/29/2023 CLINICAL DATA:  Altered mental status.  Chest pain. EXAM: PORTABLE CHEST 1 VIEW COMPARISON:  August 16, 2023. FINDINGS: Stable cardiomediastinal silhouette. Mild bibasilar subsegmental atelectasis is noted with possible minimal pleural effusions. Bilateral rib fractures are again noted. IMPRESSION: Mild bibasilar subsegmental atelectasis with possible minimal pleural effusions. Electronically Signed   By: [name] M.D.   On: 08/29/2023 13:47   DG Shoulder Left Result Date: 08/19/2023 CLINICAL DATA:  Pain.  No history of trauma reported EXAM: LEFT SHOULDER - 3 VIEW COMPARISON:  None Available. FINDINGS: Joint space loss and sclerosis of the [facility] joint. Preserved glenohumeral  joint. No fracture or dislocation of the shoulder. Osteopenia. Multiple left-sided rib deformities. Please correlate with separate x-ray of the chest from 08/16/2023. there is some areas of lucency along the shaft of the humerus. Some of these are subcortical. IMPRESSION: Degenerative changes of the [facility] joint. Osteopenia. Multiple old left rib fractures. Lucent lesions along the proximal left humeral shaft. Some of these are subcortical. Please correlate for any known history such as multiple myeloma. Electronically Signed   By: Karen Kays M.D.   On: 08/19/2023 16:49   ECHOCARDIOGRAM COMPLETE Result Date: 08/17/2023    ECHOCARDIOGRAM REPORT   Patient Name:   Daniel Whitaker Date of Exam: 08/17/2023 Medical Rec #:  [id number]        Height:       72.0 in Accession #:    [id number]       Weight:       218.3 lb Date of Birth:  [birth year]       BSA:          2.211 m Patient Age:    77 years         BP:  130/51 mmHg Patient Gender: M                HR:           63 bpm. Exam Location:  Inpatient Procedure: 2D Echo, Cardiac Doppler, Color Doppler and Intracardiac            Opacification Agent (Both Spectral and Color Flow Doppler were            utilized during procedure). Indications:    A fib  History:        Patient has prior history of Echocardiogram examinations, most                 recent 07/20/2023. CAD, Arrythmias:Atrial Fibrillation; Risk                 Factors:Hypertension.  Sonographer:    Amy Chionchio Referring Phys: [id number] SUBRINA SUNDIL IMPRESSIONS  1. Left ventricular ejection fraction, by estimation, is 60 to 65%. The left ventricle has normal function. The left ventricle has no regional wall motion abnormalities. Left ventricular diastolic parameters were normal.  2. Right ventricular systolic function is normal. The right ventricular size is normal.  3. Left atrial size was severely dilated.  4. The mitral valve is normal in structure. Mild mitral valve regurgitation. No evidence of  mitral stenosis.  5. The aortic valve is tricuspid. Aortic valve regurgitation is not visualized. Aortic valve sclerosis is present, with no evidence of aortic valve stenosis.  6. Aortic dilatation noted. There is mild dilatation of the ascending aorta, measuring 39 mm. FINDINGS  Left Ventricle: Left ventricular ejection fraction, by estimation, is 60 to 65%. The left ventricle has normal function. The left ventricle has no regional wall motion abnormalities. Definity contrast agent was given IV to delineate the left ventricular  endocardial borders. The left ventricular internal cavity size was normal in size. There is no left ventricular hypertrophy. Left ventricular diastolic parameters were normal. Right Ventricle: The right ventricular size is normal. Right ventricular systolic function is normal. Left Atrium: Left atrial size was severely dilated. Right Atrium: Right atrial size was normal in size. Pericardium: There is no evidence of pericardial effusion. Mitral Valve: The mitral valve is normal in structure. Mild mitral valve regurgitation. No evidence of mitral valve stenosis. MV peak gradient, 4.2 mmHg. The mean mitral valve gradient is 1.0 mmHg. Tricuspid Valve: The tricuspid valve is normal in structure. Tricuspid valve regurgitation is mild . No evidence of tricuspid stenosis. Aortic Valve: The aortic valve is tricuspid. Aortic valve regurgitation is not visualized. Aortic valve sclerosis is present, with no evidence of aortic valve stenosis. Aortic valve mean gradient measures 4.0 mmHg. Aortic valve peak gradient measures 8.5  mmHg. Aortic valve area, by VTI measures 2.66 cm. Pulmonic Valve: The pulmonic valve was not well visualized. Pulmonic valve regurgitation is not visualized. No evidence of pulmonic stenosis. Aorta: Aortic dilatation noted. There is mild dilatation of the ascending aorta, measuring 39 mm. Venous: The inferior vena cava was not well visualized. IAS/Shunts: The interatrial septum  was not well visualized.  LEFT VENTRICLE PLAX 2D LVIDd:         5.00 cm      Diastology LVIDs:         2.80 cm      LV e' medial:    7.62 cm/s LV PW:         0.80 cm      LV E/e' medial:  11.9 LV IVS:  0.80 cm      LV e' lateral:   9.90 cm/s LVOT diam:     2.10 cm      LV E/e' lateral: 9.2 LV SV:         80 LV SV Index:   36 LVOT Area:     3.46 cm  LV Volumes (MOD) LV vol d, MOD A4C: 195.0 ml LV vol s, MOD A4C: 82.6 ml LV SV MOD A4C:     195.0 ml RIGHT VENTRICLE RV Basal diam:  5.10 cm TAPSE (M-mode): 2.0 cm LEFT ATRIUM              Index        RIGHT ATRIUM           Index LA Vol (A2C):   162.0 ml 73.27 ml/m  RA Area:     21.80 cm LA Vol (A4C):   146.5 ml 66.26 ml/m  RA Volume:   67.00 ml  30.30 ml/m LA Biplane Vol: 150.0 ml 67.84 ml/m  AORTIC VALVE                    PULMONIC VALVE AV Area (Vmax):    2.60 cm     PV Vmax:       0.90 m/s AV Area (Vmean):   2.50 cm     PV Peak grad:  3.2 mmHg AV Area (VTI):     2.66 cm AV Vmax:           146.00 cm/s AV Vmean:          97.500 cm/s AV VTI:            0.299 m AV Peak Grad:      8.5 mmHg AV Mean Grad:      4.0 mmHg LVOT Vmax:         109.50 cm/s LVOT Vmean:        70.350 cm/s LVOT VTI:          0.230 m LVOT/AV VTI ratio: 0.77  AORTA Ao Root diam: 3.50 cm Ao Asc diam:  4.10 cm MITRAL VALVE               TRICUSPID VALVE MV Area (PHT): 3.34 cm    TR Peak grad:   38.2 mmHg MV Area VTI:   2.58 cm    TR Vmax:        309.00 cm/s MV Peak grad:  4.2 mmHg MV Mean grad:  1.0 mmHg    SHUNTS MV Vmax:       1.02 m/s    Systemic VTI:  0.23 m MV Vmean:      58.1 cm/s   Systemic Diam: 2.10 cm MV Decel Time: 227 msec MV E velocity: 90.80 cm/s MV A velocity: 62.40 cm/s MV E/A ratio:  1.46 Olga Millers MD Electronically signed by Olga Millers MD Signature Date/Time: 08/17/2023/11:31:41 AM    Final    DG Chest Port 1 View Result Date: 08/16/2023 CLINICAL DATA:  Shortness of breath and fever. Current chemotherapy. History of multiple myeloma previous exam. EXAM:  PORTABLE CHEST 1 VIEW COMPARISON:  07/25/2023, 07/26/2023. FINDINGS: The heart is enlarged and mediastinal contours is stable. Lung volumes are low with patchy airspace disease at the lung bases, increased from the prior exam. There are small bilateral pleural effusions. No pneumothorax is seen. Multiple lytic lesions are seen within the bones. Bilateral rib fractures are noted and unchanged from the previous exam. IMPRESSION: 1. Increased patchy  airspace disease at the lung bases, possible atelectasis or infiltrate. 2. Small bilateral pleural effusions. 3. Stable lytic lesions throughout the bones with bilateral rib fractures and compatible with history of multiple myeloma. Electronically Signed   By: Thornell Sartorius M.D.   On: 08/16/2023 19:29      Subjective: Patient seen and examined at bedside.  Feels better and feels okay to go home today.  No fever, vomiting, worsening shortness of breath reported.  Discharge Exam: Vitals:   09/02/23 1515 09/02/23 2101  BP: (!) 130/54 124/76  Pulse: 72 88  Resp: 16   Temp:  97.8 F (36.6 C)  SpO2: 97% 97%    General: Pt is alert, awake, not in acute distress.  On room air.  Looks chronically ill and deconditioned. Cardiovascular: rate controlled, S1/S2 + Respiratory: bilateral decreased breath sounds at bases with scattered crackles Abdominal: Soft, NT, ND, bowel sounds + Extremities: Trace lower extremity edema; no cyanosis    The results of significant diagnostics from this hospitalization (including imaging, microbiology, ancillary and laboratory) are listed below for reference.     Microbiology: Recent Results (from the past 240 hours)  Resp panel by RT-PCR (RSV, Flu A&B, Covid) Anterior Nasal Swab     Status: None   Collection Time: 08/29/23  1:36 PM   Specimen: Anterior Nasal Swab  Result Value Ref Range Status   SARS Coronavirus 2 by RT PCR NEGATIVE NEGATIVE Final    Comment: (NOTE) SARS-CoV-2 target nucleic acids are NOT  DETECTED.  The SARS-CoV-2 RNA is generally detectable in upper respiratory specimens during the acute phase of infection. The lowest concentration of SARS-CoV-2 viral copies this assay can detect is 138 copies/mL. A negative result does not preclude SARS-Cov-2 infection and should not be used as the sole basis for treatment or other patient management decisions. A negative result may occur with  improper specimen collection/handling, submission of specimen other than nasopharyngeal swab, presence of viral mutation(s) within the areas targeted by this assay, and inadequate number of viral copies(<138 copies/mL). A negative result must be combined with clinical observations, patient history, and epidemiological information. The expected result is Negative.  Fact Sheet for Patients:  BloggerCourse.com  Fact Sheet for Healthcare Providers:  SeriousBroker.it  This test is no t yet approved or cleared by the [place] FDA and  has been authorized for detection and/or diagnosis of SARS-CoV-2 by FDA under an Emergency Use Authorization (EUA). This EUA will remain  in effect (meaning this test can be used) for the duration of the COVID-19 declaration under Section 564(b)(1) of the Act, 21 U.S.C.section 360bbb-3(b)(1), unless the authorization is terminated  or revoked sooner.       Influenza A by PCR NEGATIVE NEGATIVE Final   Influenza B by PCR NEGATIVE NEGATIVE Final    Comment: (NOTE) The Xpert Xpress SARS-CoV-2/FLU/RSV plus assay is intended as an aid in the diagnosis of influenza from Nasopharyngeal swab specimens and should not be used as a sole basis for treatment. Nasal washings and aspirates are unacceptable for Xpert Xpress SARS-CoV-2/FLU/RSV testing.  Fact Sheet for Patients: BloggerCourse.com  Fact Sheet for Healthcare Providers: SeriousBroker.it  This test is not yet  approved or cleared by the [place] FDA and has been authorized for detection and/or diagnosis of SARS-CoV-2 by FDA under an Emergency Use Authorization (EUA). This EUA will remain in effect (meaning this test can be used) for the duration of the COVID-19 declaration under Section 564(b)(1) of the Act, 21 U.S.C. section 360bbb-3(b)(1), unless  the authorization is terminated or revoked.     Resp Syncytial Virus by PCR NEGATIVE NEGATIVE Final    Comment: (NOTE) Fact Sheet for Patients: BloggerCourse.com  Fact Sheet for Healthcare Providers: SeriousBroker.it  This test is not yet approved or cleared by the [place] FDA and has been authorized for detection and/or diagnosis of SARS-CoV-2 by FDA under an Emergency Use Authorization (EUA). This EUA will remain in effect (meaning this test can be used) for the duration of the COVID-19 declaration under Section 564(b)(1) of the Act, 21 U.S.C. section 360bbb-3(b)(1), unless the authorization is terminated or revoked.  Performed at [facility], [address], [place], [place] [address]      Labs: BNP (last 3 results) Recent Labs    07/23/23 1016 07/25/23 1133 08/17/23 1219  BNP 116.3* 210.9* 265.3*   Basic Metabolic Panel: Recent Labs  Lab 08/29/23 1336 08/29/23 1352 08/30/23 0841 08/30/23 1900 08/31/23 0632 08/31/23 1252 08/31/23 2049 09/01/23 0613 09/01/23 0614 09/02/23 0601 09/03/23 0538  NA 138   < > 140   < > 142  --   --  138 138 134* 135  K 3.1*   < > 3.1*   < > 2.6*  --   --  3.3* 3.3* 3.0* 3.7  CL 100   < > 100   < > 99  --   --  101 101 99 101  CO2 27  --  31   < > 30  --   --  26 25 26 26   GLUCOSE 139*   < > 114*   < > 90  --   --  97 97 86 117*  BUN 47*   < > 44*   < > 40*  --   --  42* 43* 49* 42*  CREATININE 1.16   < > 1.15   < > 1.07  --   --  0.94 0.96 1.08 0.93  CALCIUM 12.7*  --  13.0*   < > 11.8*   < > 11.7* 11.1* 11.2*  9.8 8.6*  MG 1.7  --  1.4*  --   --   --   --  1.4*  --  1.8 1.7  PHOS  --   --   --   --   --   --   --  2.8 2.9 2.4* 2.5   < > = values in this interval not displayed.   Liver Function Tests: Recent Labs  Lab 08/29/23 1336 08/30/23 0841 08/31/23 0981 09/01/23 0613 09/01/23 0614 09/02/23 0601 09/03/23 0538  AST 13* 9*  --  10*  --   --   --   ALT 11 8  --  9  --   --   --   ALKPHOS 66 68  --  53  --   --   --   BILITOT 0.4 0.4  --  0.5  --   --   --   PROT 9.1* 9.7*  --  8.1  --   --   --   ALBUMIN 2.6* 3.2* 2.6* 2.5* 2.4* 2.7* 2.3*   No results for input(s): "LIPASE", "AMYLASE" in the last 168 hours. No results for input(s): "AMMONIA" in the last 168 hours. CBC: Recent Labs  Lab 08/29/23 1336 08/29/23 1352 08/30/23 0841 08/31/23 0632 09/01/23 0613 09/02/23 0601 09/03/23 0538  WBC 9.2  --  8.7 7.5 6.6 6.1 7.0  NEUTROABS 7.9*  --  6.7  --   --  4.7 6.4  HGB 9.1*   < > 9.4* 8.7* 8.4* 8.9* 7.8*  HCT 29.6*   < > 30.7* 28.3* 27.5* 28.9* 25.6*  MCV 88.9  --  88.5 89.3 90.8 89.5 89.5  PLT 260  --  289 255 217 234 230   < > = values in this interval not displayed.   Cardiac Enzymes: No results for input(s): "CKTOTAL", "CKMB", "CKMBINDEX", "TROPONINI" in the last 168 hours. BNP: Invalid input(s): "POCBNP" CBG: Recent Labs  Lab 08/29/23 1322  GLUCAP 130*   D-Dimer No results for input(s): "DDIMER" in the last 72 hours. Hgb A1c No results for input(s): "HGBA1C" in the last 72 hours. Lipid Profile No results for input(s): "CHOL", "HDL", "LDLCALC", "TRIG", "CHOLHDL", "LDLDIRECT" in the last 72 hours. Thyroid function studies No results for input(s): "TSH", "T4TOTAL", "T3FREE", "THYROIDAB" in the last 72 hours.  Invalid input(s): "FREET3" Anemia work up No results for input(s): "VITAMINB12", "FOLATE", "FERRITIN", "TIBC", "IRON", "RETICCTPCT" in the last 72 hours. Urinalysis    Component Value Date/Time   COLORURINE STRAW (A) 08/29/2023 1336   APPEARANCEUR HAZY  (A) 08/29/2023 1336   LABSPEC 1.006 08/29/2023 1336   PHURINE 6.0 08/29/2023 1336   GLUCOSEU NEGATIVE 08/29/2023 1336   HGBUR SMALL (A) 08/29/2023 1336   BILIRUBINUR NEGATIVE 08/29/2023 1336   KETONESUR NEGATIVE 08/29/2023 1336   PROTEINUR NEGATIVE 08/29/2023 1336   UROBILINOGEN 1.0 04/15/2013 2256   NITRITE NEGATIVE 08/29/2023 1336   LEUKOCYTESUR MODERATE (A) 08/29/2023 1336   Sepsis Labs Recent Labs  Lab 08/31/23 0632 09/01/23 0613 09/02/23 0601 09/03/23 0538  WBC 7.5 6.6 6.1 7.0   Microbiology Recent Results (from the past 240 hours)  Resp panel by RT-PCR (RSV, Flu A&B, Covid) Anterior Nasal Swab     Status: None   Collection Time: 08/29/23  1:36 PM   Specimen: Anterior Nasal Swab  Result Value Ref Range Status   SARS Coronavirus 2 by RT PCR NEGATIVE NEGATIVE Final    Comment: (NOTE) SARS-CoV-2 target nucleic acids are NOT DETECTED.  The SARS-CoV-2 RNA is generally detectable in upper respiratory specimens during the acute phase of infection. The lowest concentration of SARS-CoV-2 viral copies this assay can detect is 138 copies/mL. A negative result does not preclude SARS-Cov-2 infection and should not be used as the sole basis for treatment or other patient management decisions. A negative result may occur with  improper specimen collection/handling, submission of specimen other than nasopharyngeal swab, presence of viral mutation(s) within the areas targeted by this assay, and inadequate number of viral copies(<138 copies/mL). A negative result must be combined with clinical observations, patient history, and epidemiological information. The expected result is Negative.  Fact Sheet for Patients:  BloggerCourse.com  Fact Sheet for Healthcare Providers:  SeriousBroker.it  This test is no t yet approved or cleared by the [place] FDA and  has been authorized for detection and/or diagnosis of SARS-CoV-2 by FDA  under an Emergency Use Authorization (EUA). This EUA will remain  in effect (meaning this test can be used) for the duration of the COVID-19 declaration under Section 564(b)(1) of the Act, 21 U.S.C.section 360bbb-3(b)(1), unless the authorization is terminated  or revoked sooner.       Influenza A by PCR NEGATIVE NEGATIVE Final   Influenza B by PCR NEGATIVE NEGATIVE Final    Comment: (NOTE) The Xpert Xpress SARS-CoV-2/FLU/RSV plus assay is intended as an aid in the diagnosis of influenza from Nasopharyngeal swab specimens and should not be used as a sole basis  for treatment. Nasal washings and aspirates are unacceptable for Xpert Xpress SARS-CoV-2/FLU/RSV testing.  Fact Sheet for Patients: BloggerCourse.com  Fact Sheet for Healthcare Providers: SeriousBroker.it  This test is not yet approved or cleared by the [place] FDA and has been authorized for detection and/or diagnosis of SARS-CoV-2 by FDA under an Emergency Use Authorization (EUA). This EUA will remain in effect (meaning this test can be used) for the duration of the COVID-19 declaration under Section 564(b)(1) of the Act, 21 U.S.C. section 360bbb-3(b)(1), unless the authorization is terminated or revoked.     Resp Syncytial Virus by PCR NEGATIVE NEGATIVE Final    Comment: (NOTE) Fact Sheet for Patients: BloggerCourse.com  Fact Sheet for Healthcare Providers: SeriousBroker.it  This test is not yet approved or cleared by the [place] FDA and has been authorized for detection and/or diagnosis of SARS-CoV-2 by FDA under an Emergency Use Authorization (EUA). This EUA will remain in effect (meaning this test can be used) for the duration of the COVID-19 declaration under Section 564(b)(1) of the Act, 21 U.S.C. section 360bbb-3(b)(1), unless the authorization is terminated or revoked.  Performed at [facility], [address] [place], [place] [address]      Time coordinating discharge: 35 minutes  SIGNED:   Glade Lloyd, MD  [facility] 09/03/2023, 10:32 AM

## 2023-09-03 NOTE — Plan of Care (Signed)

## 2023-09-03 NOTE — TOC Initial Note (Signed)
 Transition of Care [facility]) - Initial/Assessment Note    Patient Details  Name: Daniel Whitaker MRN: [id number] Date of Birth: Sep 30, 1945  [facility]) CM/SW Contact:    Beckie Busing, RN Phone Number: 09/03/2023, 12:56 PM  Clinical Narrative:              TOC following patient from home. Patient previously set up with home health through [place] and will resume services at discharge. Patient is from home where he functions independently. Currently there are no additional TOC needs.   Expected Discharge Plan: Home/Self Care Barriers to Discharge: No Barriers Identified   Patient Goals and CMS Choice Patient states their goals for this hospitalization and ongoing recovery are:: Wants to go home CMS Medicare.gov Compare Post Acute Care list provided to::  (n/a)   [place] ownership interest in [facility]provided to::  (n/a)    Expected Discharge Plan and Services In-house Referral: NA Discharge Planning Services: CM Consult   Living arrangements for the past 2 months: Single Family Home Expected Discharge Date: 09/03/23               DME Arranged: N/A DME Agency: NA                  Prior Living Arrangements/Services Living arrangements for the past 2 months: Single Family Home Lives with:: Spouse Patient language and need for interpreter reviewed:: Yes Do you feel safe going back to the place where you live?: Yes      Need for Family Participation in Patient Care: Yes (Comment) Care giver support system in place?: Yes (comment)   Criminal Activity/Legal Involvement Pertinent to Current Situation/Hospitalization: No - Comment as needed  Activities of Daily Living   ADL Screening (condition at time of admission) Independently performs ADLs?: No Does the patient have a NEW difficulty with bathing/dressing/toileting/self-feeding that is expected to last >3 days?: No Does the patient have a NEW difficulty with getting in/out of bed, walking, or  climbing stairs that is expected to last >3 days?: Yes (Initiates electronic notice to provider for possible PT consult) Does the patient have a NEW difficulty with communication that is expected to last >3 days?: No Is the patient deaf or have difficulty hearing?: Yes Does the patient have difficulty seeing, even when wearing glasses/contacts?: Yes Does the patient have difficulty concentrating, remembering, or making decisions?: Yes  Permission Sought/Granted   Permission granted to share information with : No              Emotional Assessment Appearance:: Appears stated age Attitude/Demeanor/Rapport: Gracious, Engaged Affect (typically observed): Accepting Orientation: : Oriented to Self, Oriented to Place, Oriented to  Time, Oriented to Situation Alcohol / Substance Use: Not Applicable Psych Involvement: No (comment)  Admission diagnosis:  Hypercalcemia [E83.52] Patient Active Problem List   Diagnosis Date Noted   Paroxysmal atrial fibrillation (HCC) 08/25/2023   Demand ischemia (HCC) 08/18/2023   Sepsis due to pneumonia (HCC) 08/16/2023   Atrial fibrillation with RVR (HCC) 08/16/2023   Chronic hypoxic respiratory failure (HCC) 08/16/2023   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) 08/16/2023   Poor dentition 08/04/2023   Mild protein-calorie malnutrition (HCC) 07/30/2023   Fluid overload 07/23/2023   Acute on chronic hypoxic respiratory failure (HCC) 07/23/2023   Pressure injury of skin 07/23/2023   URI (upper respiratory infection) 07/23/2023   Reactive airway disease 07/23/2023   Glaucoma 07/23/2023   Pancytopenia, acquired (HCC) 07/09/2023   Decubitus  ulcer of buttock, stage 1 07/09/2023   Orthopnea 07/02/2023   Right leg pain 06/18/2023   Acquired hypogammaglobulinemia (HCC) 06/13/2023   Post-nasal drip 06/03/2023   Other constipation 05/21/2023   Cancer related pain 05/21/2023   Hypercalcemia 05/03/2023   AKI (acute kidney  injury) (HCC) 05/03/2023   Rib fractures 05/03/2023   Atelectasis 05/03/2023   Unilateral primary osteoarthritis, left knee 06/08/2022   Essential hypertension 05/03/2022   Anemia of chronic disease 03/27/2022   Restless leg syndrome 01/19/2022   Gait abnormality 01/19/2022   Obstructive sleep apnea 01/19/2022   CAD (coronary artery disease) 10/22/2021   Hyperlipidemia 10/22/2021   Class 1 obesity due to excess calories in adult 03/20/2021   Nocturnal leg cramps 03/11/2021   Lumbar radiculopathy 03/15/2017   Multiple myeloma (HCC) 07/11/2013   Deficiency anemia 07/11/2013   Hypokalemia 04/15/2013   Hyperglycemia 04/15/2013   Neuropathy 04/15/2013   Hereditary and idiopathic peripheral neuropathy 04/06/2013   Cervical spondylosis 12/06/2012   PCP:  [name], MD Pharmacy:   [facility] - [place], [place] - [address] [place] [address] Phone: [phone] Fax: [phone]  [facility], [place] - [address] [place] [address] Phone: [phone] Fax: [phone]  [place] [facility] [address] [address] Phone: [phone] Fax: [phone]     Social Drivers of Health (SDOH) Social History: SDOH Screenings   Food Insecurity: No Food Insecurity (08/30/2023)  Housing: Low Risk  (08/30/2023)  Transportation Needs: No Transportation Needs (08/30/2023)  Utilities: Not At Risk (08/30/2023)  Recent Concern: Utilities - At Risk (07/23/2023)  Depression (PHQ2-9): Low Risk  (03/12/2022)  Social Connections: Moderately Integrated (08/30/2023)  Tobacco Use: Medium Risk (08/30/2023)   SDOH Interventions:     Readmission Risk Interventions    08/17/2023    8:48 AM 05/07/2023   10:37 AM  Readmission Risk Prevention Plan  Transportation Screening Complete Complete  PCP or Specialist Appt within 5-7 Days  Complete  Home Care  Screening  Complete  Medication Review (RN CM)  Complete  Medication Review (RN Care Manager) Complete   PCP or Specialist appointment within 3-5 days of discharge Complete   HRI or Home Care Consult Complete   SW Recovery Care/Counseling Consult Complete   Palliative Care Screening Not Applicable   Skilled Nursing Facility Not Applicable

## 2023-09-04 LAB — TYPE AND SCREEN
ABO/RH(D): O POS
Antibody Screen: POSITIVE
Unit division: 0

## 2023-09-04 LAB — BPAM RBC
Blood Product Expiration Date: 202504152359
ISSUE DATE / TIME: 202504041220
Unit Type and Rh: 5100

## 2023-09-05 LAB — MULTIPLE MYELOMA PANEL, SERUM
Albumin SerPl Elph-Mcnc: 3.4 g/dL (ref 2.9–4.4)
Albumin/Glob SerPl: 0.6 — ABNORMAL LOW (ref 0.7–1.7)
Alpha 1: 0.3 g/dL (ref 0.0–0.4)
Alpha2 Glob SerPl Elph-Mcnc: 0.9 g/dL (ref 0.4–1.0)
B-Globulin SerPl Elph-Mcnc: 0.9 g/dL (ref 0.7–1.3)
Gamma Glob SerPl Elph-Mcnc: 4.1 g/dL — ABNORMAL HIGH (ref 0.4–1.8)
Globulin, Total: 6.2 g/dL — ABNORMAL HIGH (ref 2.2–3.9)
IgA: 3301 mg/dL — ABNORMAL HIGH (ref 61–437)
IgG (Immunoglobin G), Serum: 383 mg/dL — ABNORMAL LOW (ref 603–1613)
IgM (Immunoglobulin M), Srm: 17 mg/dL (ref 15–143)
M Protein SerPl Elph-Mcnc: 3.7 g/dL — ABNORMAL HIGH
Total Protein ELP: 9.6 g/dL — ABNORMAL HIGH (ref 6.0–8.5)

## 2023-09-05 NOTE — Progress Notes (Deleted)
  Cardiology Office Note:   Date:  09/05/2023  ID:  Daniel Whitaker, DOB 24-Dec-1945, MRN [id number] PCP: [name], MD  [facility] Providers Cardiologist:  [name], MD {  History of Present Illness:   Daniel Whitaker is a 77 y.o. adult  who presents for follow up of CAD, hyperlipidemia and obesity.  He was seen for dyspnea on exertion.  He had shortness of breath thought to be secondary to obesity and deconditioning.  Exercise treadmill test was ordered and showed ST depression and frequent PVCs.  Left heart cath in May 2023 showed 85% prox LAD disease - DES was placed with 0% residual stenosis.     Since I last saw him ***  ***  he is being managed for multiple myeloma.  He was in the hospital with hypercalcemia and AKI.  He is going to see his oncologist tomorrow and again talk about starting treatment.  He had no new cardiovascular complaints.  Right now because of rib fractures and pain he is getting around with a walker but he is doing all of his self-care.  He is actually staying with his son to avoid stairs.  The patient denies any new symptoms such as chest discomfort, neck or arm discomfort. There has been no new shortness of breath, PND or orthopnea. There have been no reported palpitations, presyncope or syncope.   ROS: ***  Studies Reviewed:    EKG:       ***  Risk Assessment/Calculations:   {Does this patient have ATRIAL FIBRILLATION?[phone]} No BP recorded.  {Refresh Note OR Click here to enter BP  :1}***        Physical Exam:   VS:  There were no vitals taken for this visit.   Wt Readings from Last 3 Encounters:  09/03/23 213 lb 13.5 oz (97 kg)  08/30/23 210 lb (95.3 kg)  08/30/23 210 lb (95.3 kg)     GEN: Well nourished, well developed in no acute distress NECK: No JVD; No carotid bruits CARDIAC: ***RR, *** murmurs, rubs, gallops RESPIRATORY:  Clear to auscultation without rales, wheezing or rhonchi  ABDOMEN: Soft, non-tender,  non-distended EXTREMITIES:  No edema; No deformity   ASSESSMENT AND PLAN:   Coronary artery disease:   ***  The patient has no new sypmtoms.  No further cardiovascular testing is indicated.  We will continue with aggressive risk reduction.  We can discontinue his Plavix as it has been over a year.     Hypertension:   The blood pressure is ***   mildly elevated but he is going to get this checked frequently during the course of his upcoming therapy.  At this point no med changes.    Hyperlipidemia:    LDL is ***  44.  No change in therapy.       Follow up ***  Signed, [name], MD

## 2023-09-06 ENCOUNTER — Ambulatory Visit: Payer: Medicare Other | Admitting: Cardiology

## 2023-09-06 DIAGNOSIS — I10 Essential (primary) hypertension: Secondary | ICD-10-CM

## 2023-09-06 DIAGNOSIS — I2510 Atherosclerotic heart disease of native coronary artery without angina pectoris: Secondary | ICD-10-CM

## 2023-09-06 DIAGNOSIS — E785 Hyperlipidemia, unspecified: Secondary | ICD-10-CM

## 2023-09-07 ENCOUNTER — Other Ambulatory Visit (HOSPITAL_COMMUNITY): Payer: Self-pay

## 2023-09-07 ENCOUNTER — Encounter: Payer: Self-pay | Admitting: Hematology and Oncology

## 2023-09-07 ENCOUNTER — Inpatient Hospital Stay: Attending: Hematology and Oncology | Admitting: Hematology and Oncology

## 2023-09-07 ENCOUNTER — Inpatient Hospital Stay

## 2023-09-07 VITALS — BP 143/80 | HR 86 | Temp 98.3°F | Resp 18 | Ht 73.0 in | Wt 212.6 lb

## 2023-09-07 DIAGNOSIS — D539 Nutritional anemia, unspecified: Secondary | ICD-10-CM | POA: Diagnosis not present

## 2023-09-07 DIAGNOSIS — C9000 Multiple myeloma not having achieved remission: Secondary | ICD-10-CM

## 2023-09-07 DIAGNOSIS — K5909 Other constipation: Secondary | ICD-10-CM

## 2023-09-07 DIAGNOSIS — Z79899 Other long term (current) drug therapy: Secondary | ICD-10-CM | POA: Insufficient documentation

## 2023-09-07 DIAGNOSIS — G893 Neoplasm related pain (acute) (chronic): Secondary | ICD-10-CM

## 2023-09-07 DIAGNOSIS — Z5112 Encounter for antineoplastic immunotherapy: Secondary | ICD-10-CM | POA: Insufficient documentation

## 2023-09-07 DIAGNOSIS — E8770 Fluid overload, unspecified: Secondary | ICD-10-CM

## 2023-09-07 LAB — CMP (CANCER CENTER ONLY)
ALT: 17 U/L (ref 0–44)
AST: 14 U/L — ABNORMAL LOW (ref 15–41)
Albumin: 3.2 g/dL — ABNORMAL LOW (ref 3.5–5.0)
Alkaline Phosphatase: 80 U/L (ref 38–126)
Anion gap: 7 (ref 5–15)
BUN: 41 mg/dL — ABNORMAL HIGH (ref 8–23)
CO2: 27 mmol/L (ref 22–32)
Calcium: 8.3 mg/dL — ABNORMAL LOW (ref 8.9–10.3)
Chloride: 107 mmol/L (ref 98–111)
Creatinine: 0.86 mg/dL (ref 0.61–1.24)
GFR, Estimated: >60 mL/min (ref >60)
Glucose, Bld: 112 mg/dL — ABNORMAL HIGH (ref 70–99)
Potassium: 3.2 mmol/L — ABNORMAL LOW (ref 3.5–5.1)
Sodium: 141 mmol/L (ref 135–145)
Total Bilirubin: 0.4 mg/dL (ref 0.0–1.2)
Total Protein: 8.3 g/dL — ABNORMAL HIGH (ref 6.5–8.1)

## 2023-09-07 LAB — CBC WITH DIFFERENTIAL (CANCER CENTER ONLY)
Abs Immature Granulocytes: 0.05 10*3/uL (ref 0.00–0.07)
Basophils Absolute: 0.1 10*3/uL (ref 0.0–0.1)
Basophils Relative: 1 %
Eosinophils Absolute: 0.1 10*3/uL (ref 0.0–0.5)
Eosinophils Relative: 1 %
HCT: 29.5 % — ABNORMAL LOW (ref 39.0–52.0)
Hemoglobin: 9.4 g/dL — ABNORMAL LOW (ref 13.0–17.0)
Immature Granulocytes: 0 %
Lymphocytes Relative: 12 %
Lymphs Abs: 1.4 10*3/uL (ref 0.7–4.0)
MCH: 27.3 pg (ref 26.0–34.0)
MCHC: 31.9 g/dL (ref 30.0–36.0)
MCV: 85.8 fL (ref 80.0–100.0)
Monocytes Absolute: 1.2 10*3/uL — ABNORMAL HIGH (ref 0.1–1.0)
Monocytes Relative: 10 %
Neutro Abs: 9 10*3/uL — ABNORMAL HIGH (ref 1.7–7.7)
Neutrophils Relative %: 76 %
Platelet Count: 223 10*3/uL (ref 150–400)
RBC: 3.44 MIL/uL — ABNORMAL LOW (ref 4.22–5.81)
RDW: 16.8 % — ABNORMAL HIGH (ref 11.5–15.5)
WBC Count: 11.8 10*3/uL — ABNORMAL HIGH (ref 4.0–10.5)
nRBC: 0 % (ref 0.0–0.2)

## 2023-09-07 MED ORDER — HEPARIN SOD (PORK) LOCK FLUSH 100 UNIT/ML IV SOLN
500.0000 [IU] | Freq: Once | INTRAVENOUS | Status: AC
  Administered 2023-09-07: 500 [IU]

## 2023-09-07 MED ORDER — SODIUM CHLORIDE 0.9% FLUSH
10.0000 mL | Freq: Once | INTRAVENOUS | Status: AC
  Administered 2023-09-07: 10 mL

## 2023-09-07 MED ORDER — OXYCODONE HCL 15 MG PO TABS
15.0000 mg | ORAL_TABLET | ORAL | 0 refills | Status: DC | PRN
  Filled 2023-09-07: qty 90, 15d supply, fill #0

## 2023-09-07 NOTE — Assessment & Plan Note (Addendum)
 Multifactorial This is stable Monitor closely He will receive a unit of blood if hemoglobin is less than 8

## 2023-09-07 NOTE — Assessment & Plan Note (Addendum)
 He is euvolemic on exam He will continue furosemide 40 mg in the morning and 20 mg in the afternoon If his weight dropped to less than 210 pounds, he will omit afternoon dose

## 2023-09-07 NOTE — Assessment & Plan Note (Addendum)
We discussed importance of laxatives 

## 2023-09-07 NOTE — Assessment & Plan Note (Addendum)
 His cancer associated pain is poorly controlled He will continue MS Contin at night We discussed risk and benefits of increasing oxycodone as needed during daytime and he agreed to try

## 2023-09-07 NOTE — Assessment & Plan Note (Addendum)
 He has IgA kappa multiple myeloma with multiple bone fractures at presentation in 2024.  Bone marrow biopsy showed 15% involvement, standard risk myeloma FISH and cytogenetics  He ha initially good partial response with combination chemotherapy with daratumumab, lenalidomide, bortezomib and dexamethasone but then started to have progression of disease Recent myeloma panel confirm disease progression and the patient was admitted due to uncontrolled hypercalcemia He was started on cycle 1 combination chemotherapy with carfilzomib, cyclophosphamide and dexamethasone and tolerated that well  He will continue Eliquis for DVT prophylaxis He will continue vitamin D supplement He is on acyclovir for antimicrobial prophylaxis He had received Zometa on April 1, next dose will be due in July He will receive cycle 2 of treatment this week I recommend he continue on high-dose dexamethasone daily at 6 mg on his nonchemotherapy days

## 2023-09-07 NOTE — Progress Notes (Signed)
 [facility] OFFICE PROGRESS NOTE  Patient Care Team: [name], MD as PCP - General (Family Medicine) [name], MD as PCP - Cardiology (Cardiology)  Assessment & Plan Multiple myeloma not having achieved remission [facility]) He has IgA kappa multiple myeloma with multiple bone fractures at presentation in 2024.  Bone marrow biopsy showed 15% involvement, standard risk myeloma FISH and cytogenetics  He ha initially good partial response with combination chemotherapy with daratumumab, lenalidomide, bortezomib and dexamethasone but then started to have progression of disease Recent myeloma panel confirm disease progression and the patient was admitted due to uncontrolled hypercalcemia He was started on cycle 1 combination chemotherapy with carfilzomib, cyclophosphamide and dexamethasone and tolerated that well  He will continue Eliquis for DVT prophylaxis He will continue vitamin D supplement He is on acyclovir for antimicrobial prophylaxis He had received Zometa on April 1, next dose will be due in July He will receive cycle 2 of treatment this week I recommend he continue on high-dose dexamethasone daily at 6 mg on his nonchemotherapy days Other constipation We discussed importance of laxatives Deficiency anemia Multifactorial This is stable Monitor closely He will receive a unit of blood if hemoglobin is less than 8 Cancer related pain His cancer associated pain is poorly controlled He will continue MS Contin at night We discussed risk and benefits of increasing oxycodone as needed during daytime and he agreed to try Hypervolemia, unspecified hypervolemia type He is euvolemic on exam He will continue furosemide 40 mg in the morning and 20 mg in the afternoon If his weight dropped to less than 210 pounds, he will omit afternoon dose  No orders of the defined types were placed in this encounter.    [name], MD  INTERVAL HISTORY: he returns for  treatment follow-up Complications related to previous cycle of chemotherapy included anemia,, constipation,, cancer associated pain,, leg edema, and shortness of breath He is here accompanied by family He is grandson thinks that shortness of breath could be due to anxiety His pain is suboptimally controlled He was borderline constipated recently but resolved with laxatives yesterday We reviewed recent myeloma panel results and his blood work today His hypercalcemia has resolved  PHYSICAL EXAMINATION: ECOG PERFORMANCE STATUS: 2 - Symptomatic, <50% confined to bed  Vitals:   09/07/23 1019  BP: (!) 143/80  Pulse: 86  Resp: 18  Temp: 98.3 F (36.8 C)  SpO2: 97%   Filed Weights   09/07/23 1019  Weight: 212 lb 9.6 oz (96.4 kg)    Relevant data reviewed during this visit included CBC, CMP and recent myeloma panel

## 2023-09-08 LAB — KAPPA/LAMBDA LIGHT CHAINS
Kappa free light chain: 280 mg/L — ABNORMAL HIGH (ref 3.3–19.4)
Kappa, lambda light chain ratio: 147.37 — ABNORMAL HIGH (ref 0.26–1.65)
Lambda free light chains: 1.9 mg/L — ABNORMAL LOW (ref 5.7–26.3)

## 2023-09-08 MED FILL — Dexamethasone Sodium Phosphate Inj 100 MG/10ML: INTRAMUSCULAR | Qty: 2 | Status: AC

## 2023-09-09 ENCOUNTER — Inpatient Hospital Stay

## 2023-09-09 VITALS — BP 136/66 | HR 75 | Temp 97.8°F | Resp 16

## 2023-09-09 DIAGNOSIS — D539 Nutritional anemia, unspecified: Secondary | ICD-10-CM | POA: Diagnosis not present

## 2023-09-09 DIAGNOSIS — Z79899 Other long term (current) drug therapy: Secondary | ICD-10-CM | POA: Diagnosis not present

## 2023-09-09 DIAGNOSIS — G893 Neoplasm related pain (acute) (chronic): Secondary | ICD-10-CM | POA: Diagnosis not present

## 2023-09-09 DIAGNOSIS — C9000 Multiple myeloma not having achieved remission: Secondary | ICD-10-CM

## 2023-09-09 DIAGNOSIS — Z5112 Encounter for antineoplastic immunotherapy: Secondary | ICD-10-CM | POA: Diagnosis not present

## 2023-09-09 LAB — MULTIPLE MYELOMA PANEL, SERUM
Albumin SerPl Elph-Mcnc: 3.1 g/dL (ref 2.9–4.4)
Albumin/Glob SerPl: 0.7 (ref 0.7–1.7)
Alpha 1: 0.3 g/dL (ref 0.0–0.4)
Alpha2 Glob SerPl Elph-Mcnc: 0.8 g/dL (ref 0.4–1.0)
B-Globulin SerPl Elph-Mcnc: 0.8 g/dL (ref 0.7–1.3)
Gamma Glob SerPl Elph-Mcnc: 2.7 g/dL — ABNORMAL HIGH (ref 0.4–1.8)
Globulin, Total: 4.6 g/dL — ABNORMAL HIGH (ref 2.2–3.9)
IgA: 2494 mg/dL — ABNORMAL HIGH (ref 61–437)
IgG (Immunoglobin G), Serum: 368 mg/dL — ABNORMAL LOW (ref 603–1613)
IgM (Immunoglobulin M), Srm: 16 mg/dL (ref 15–143)
M Protein SerPl Elph-Mcnc: 2.2 g/dL — ABNORMAL HIGH
Total Protein ELP: 7.7 g/dL (ref 6.0–8.5)

## 2023-09-09 MED ORDER — SODIUM CHLORIDE 0.9 % IV SOLN: Freq: Once | INTRAVENOUS | Status: AC

## 2023-09-09 MED ORDER — DEXTROSE 5 % IV SOLN
36.0000 mg/m2 | Freq: Once | INTRAVENOUS | Status: AC
  Administered 2023-09-09: 80 mg via INTRAVENOUS
  Filled 2023-09-09: qty 30

## 2023-09-09 MED ORDER — DEXAMETHASONE SODIUM PHOSPHATE 100 MG/10ML IJ SOLN
20.0000 mg | Freq: Once | INTRAMUSCULAR | Status: AC
  Administered 2023-09-09: 20 mg via INTRAVENOUS
  Filled 2023-09-09: qty 20

## 2023-09-09 MED ORDER — SODIUM CHLORIDE 0.9 % IV SOLN
500.0000 mg | Freq: Once | INTRAVENOUS | Status: AC
  Administered 2023-09-09: 500 mg via INTRAVENOUS
  Filled 2023-09-09: qty 25

## 2023-09-09 NOTE — Patient Instructions (Signed)
 [facility] - A DEPT OF [facility]  Discharge Instructions: Thank you for choosing [facility] to provide your oncology and hematology care.   If you have a lab appointment with the Cancer Center, please go directly to the Cancer Center and check in at the registration area.   Wear comfortable clothing and clothing appropriate for easy access to any Portacath or PICC line.   We strive to give you quality time with your provider. You may need to reschedule your appointment if you arrive late (15 or more minutes).  Arriving late affects you and other patients whose appointments are after yours.  Also, if you miss three or more appointments without notifying the office, you may be dismissed from the clinic at the provider's discretion.      For prescription refill requests, have your pharmacy contact our office and allow 72 hours for refills to be completed.    Today you received the following chemotherapy and/or immunotherapy agents kyprolis, cytoxan      To help prevent nausea and vomiting after your treatment, we encourage you to take your nausea medication as directed.  BELOW ARE SYMPTOMS THAT SHOULD BE REPORTED IMMEDIATELY: *FEVER GREATER THAN 100.4 F (38 C) OR HIGHER *CHILLS OR SWEATING *NAUSEA AND VOMITING THAT IS NOT CONTROLLED WITH YOUR NAUSEA MEDICATION *UNUSUAL SHORTNESS OF BREATH *UNUSUAL BRUISING OR BLEEDING *URINARY PROBLEMS (pain or burning when urinating, or frequent urination) *BOWEL PROBLEMS (unusual diarrhea, constipation, pain near the anus) TENDERNESS IN MOUTH AND THROAT WITH OR WITHOUT PRESENCE OF ULCERS (sore throat, sores in mouth, or a toothache) UNUSUAL RASH, SWELLING OR PAIN  UNUSUAL VAGINAL DISCHARGE OR ITCHING   Items with * indicate a potential emergency and should be followed up as soon as possible or go to the Emergency Department if any problems should occur.  Please show the CHEMOTHERAPY ALERT CARD or  IMMUNOTHERAPY ALERT CARD at check-in to the Emergency Department and triage nurse.  Should you have questions after your visit or need to cancel or reschedule your appointment, please contact [facility] - A DEPT OF [facility]  Dept: [phone]  and follow the prompts.  Office hours are 8:00 a.m. to 4:30 p.m. Monday - Friday. Please note that voicemails left after 4:00 p.m. may not be returned until the following business day.  We are closed weekends and major holidays. You have access to a nurse at all times for urgent questions. Please call the main number to the clinic Dept: [phone] and follow the prompts.   For any non-urgent questions, you may also contact your provider using MyChart. We now offer e-Visits for anyone 41 and older to request care online for non-urgent symptoms. For details visit [email or web address].   Also download the MyChart app! Go to the app store, search "MyChart", open the app, select [place], and log in with your MyChart username and password.

## 2023-09-10 ENCOUNTER — Ambulatory Visit: Admitting: Hematology and Oncology

## 2023-09-10 ENCOUNTER — Other Ambulatory Visit

## 2023-09-10 ENCOUNTER — Inpatient Hospital Stay

## 2023-09-10 VITALS — BP 116/53 | HR 81 | Temp 97.6°F | Resp 18

## 2023-09-10 DIAGNOSIS — Z79899 Other long term (current) drug therapy: Secondary | ICD-10-CM | POA: Diagnosis not present

## 2023-09-10 DIAGNOSIS — C9000 Multiple myeloma not having achieved remission: Secondary | ICD-10-CM | POA: Diagnosis not present

## 2023-09-10 DIAGNOSIS — G893 Neoplasm related pain (acute) (chronic): Secondary | ICD-10-CM | POA: Diagnosis not present

## 2023-09-10 DIAGNOSIS — D539 Nutritional anemia, unspecified: Secondary | ICD-10-CM | POA: Diagnosis not present

## 2023-09-10 DIAGNOSIS — Z5112 Encounter for antineoplastic immunotherapy: Secondary | ICD-10-CM | POA: Diagnosis not present

## 2023-09-10 MED ORDER — DEXTROSE 5 % IV SOLN
36.0000 mg/m2 | Freq: Once | INTRAVENOUS | Status: AC
  Administered 2023-09-10: 80 mg via INTRAVENOUS
  Filled 2023-09-10: qty 30

## 2023-09-10 MED ORDER — PROCHLORPERAZINE MALEATE 10 MG PO TABS
10.0000 mg | ORAL_TABLET | Freq: Once | ORAL | Status: AC
  Administered 2023-09-10: 10 mg via ORAL
  Filled 2023-09-10: qty 1

## 2023-09-10 MED ORDER — SODIUM CHLORIDE 0.9 % IV SOLN
INTRAVENOUS | Status: DC
Start: 2023-09-10 — End: 2023-09-10

## 2023-09-10 MED ORDER — SODIUM CHLORIDE 0.9% FLUSH
10.0000 mL | INTRAVENOUS | Status: DC | PRN
  Administered 2023-09-10: 10 mL

## 2023-09-10 MED ORDER — HEPARIN SOD (PORK) LOCK FLUSH 100 UNIT/ML IV SOLN
500.0000 [IU] | Freq: Once | INTRAVENOUS | Status: AC | PRN
  Administered 2023-09-10: 500 [IU]

## 2023-09-10 MED ORDER — SODIUM CHLORIDE 0.9 % IV SOLN: Freq: Once | INTRAVENOUS | Status: AC

## 2023-09-10 NOTE — Patient Instructions (Signed)

## 2023-09-13 DIAGNOSIS — M79602 Pain in left arm: Secondary | ICD-10-CM | POA: Diagnosis not present

## 2023-09-15 DIAGNOSIS — M79602 Pain in left arm: Secondary | ICD-10-CM | POA: Diagnosis not present

## 2023-09-15 DIAGNOSIS — H90A32 Mixed conductive and sensorineural hearing loss, unilateral, left ear with restricted hearing on the contralateral side: Secondary | ICD-10-CM | POA: Diagnosis not present

## 2023-09-15 MED FILL — Dexamethasone Sodium Phosphate Inj 100 MG/10ML: INTRAMUSCULAR | Qty: 2 | Status: AC

## 2023-09-15 NOTE — Progress Notes (Unsigned)
 Symptom Management Consult Note [facility]    Patient Care Team: [name], MD as PCP - General (Family Medicine) [name], MD as PCP - Cardiology (Cardiology)    Name / MRN / DOB: Daniel Whitaker  [id number]  [birth year]   Date of visit: 09/16/2023   Chief Complaint/Reason for visit: toxicity check   Current Therapy: carfilzomib, cyclophosphamide and dexamethasone  Last treatment:  Day 9   Cycle 1 on 09/10/23    ASSESSMENT AND PLAN Patient is a 78 y.o. adult with oncologic history of Multiple myeloma not having achieved remissionfollowed by Dr. Bertis Ruddy.  I have viewed most recent oncology note and lab work.  #Multiple myeloma not having achieved remission - Next appointment with oncologist is 09/22/23   #Cancer related pain - Oxycodone 15 mg has been too sedating per family although controls pain per patient. - Will continue 15 mg and will do trial of cutting pill in half for daytime use. - Will follow up with oncologist at next appointment. - Acute left arm pain, seeing outpatient therapist for dry needling. Will order [place] study today to rule out DVT. Patient currently taking Eliquis for afib.  #Hypervolemia - Hypoalbuminemia slightly improved 3.3 (3.2x 9 days ago) - PE today with 1+ BLE. Weight today is 217. - Family will continue to closely monitor weight and increase lasix to 40 BID if weight >220. If < 210 then decrease to 40 mg daily.  #Constipation - Currently managed with Miralax and senokot-s.  #Anemia - Stable. Counts are acceptable for treatment today. - Will continue to monitor     HEME/ONC HISTORY Oncology History Overview Note  Myeloma FISH was neg Normal cytogenetics   Multiple myeloma (HCC)  07/11/2013 Initial Diagnosis   Multiple myeloma without remission (HCC)   04/22/2023 PET scan   NM PET Image Initial (PI) Whole Body (F-18 FDG)  Result Date: 05/03/2023 CLINICAL DATA:  Subsequent treatment strategy for  multiple myeloma. EXAM: NUCLEAR MEDICINE PET WHOLE BODY TECHNIQUE: 13 mCi F-18 FDG was injected intravenously. Full-ring PET imaging was performed from the head to foot after the radiotracer. CT data was obtained and used for attenuation correction and anatomic localization. Fasting blood glucose: 114 mg/dl COMPARISON:  None Available. FINDINGS: Mediastinal blood pool activity: SUV max 2.0 HEAD/NECK: No hypermetabolic activity in the scalp. No hypermetabolic cervical lymph nodes. Incidental CT findings: Is CHEST: No hypermetabolic mediastinal or hilar nodes. No suspicious pulmonary nodules on the CT scan. Incidental CT findings: none ABDOMEN/PELVIS: No abnormal hypermetabolic activity within the liver, pancreas, adrenal glands, or spleen. No hypermetabolic lymph nodes in the abdomen or pelvis. Incidental CT findings: none SKELETON: There multiple foci focal radiotracer activity within the skeleton. Lesions are accompaniedlytic change on CT portion exam. There approximately 50 lesions. For example lesion the posterior RIGHT fifth rib with SUV max equal 3.6 on image 120. There is a lucent lesion and pathologic fracture associated with hypermetabolic activity the posterior LEFT sixth rib with SUV max equal 6.4 on image 129. Multiple lesions throughout the spine. Most intense lesion spinal lesion has associated soft tissue expansion at the the T2 vertebral body measuring 2.5 cm (107) with SUV max equal 7.3 Lesion in the inferior LEFT pubic rami subtle lucency on image 299. Suspicious lesions in LEFT and RIGHT femoral necks. Incidental CT findings: none EXTREMITIES: Hypermetabolic lesions RIGHT humeral shaft. Lesion the proximal LEFT and RIGHT femurs. Incidental CT findings: none IMPRESSION: 1. Multiple hypermetabolic lytic lesions throughout the skeleton consistent with  multiple myeloma. Approximately 50 lesions within the axillary and appendicular skeleton. Many lesions accompanied lytic/lucent lesions on CT. 2. No  evidence of soft tissue plasmacytoma. Electronically Signed   By: Genevive Bi M.D.   On: 05/03/2023 15:16   CT Chest W Contrast  Result Date: 05/03/2023 CLINICAL DATA:  Pneumonia with complication suspected. X-ray done. Cough with rib pain after coughing. Shortness of breath and right-sided pain. History of multiple myeloma. EXAM: CT CHEST WITH CONTRAST TECHNIQUE: Multidetector CT imaging of the chest was performed during intravenous contrast administration. RADIATION DOSE REDUCTION: This exam was performed according to the departmental dose-optimization program which includes automated exposure control, adjustment of the mA and/or kV according to patient size and/or use of iterative reconstruction technique. CONTRAST:  60mL OMNIPAQUE IOHEXOL 300 MG/ML  SOLN COMPARISON:  Chest radiograph 05/02/2023.  PET-CT 04/22/2023 FINDINGS: Cardiovascular: Mild cardiac enlargement. No pericardial effusions. Normal caliber thoracic aorta. Calcification of the aorta and coronary arteries. Mediastinum/Nodes: Thyroid gland is unremarkable. Esophagus is decompressed. Scattered mediastinal lymph nodes are not pathologically enlarged. Lungs/Pleura: Motion artifact limits examination. There is evidence of atelectasis or consolidation in both lung bases. Small bilateral pleural effusions. Changes could represent pneumonia. No pneumothorax. Upper Abdomen: No acute abnormalities. Musculoskeletal: Degenerative changes in the spine and shoulders. Multiple lucent lesions demonstrated in the ribs, spine, and shoulders consistent with history of myeloma. Fractures of the right lateral fifth, sixth, and seventh ribs as well as the right tenth rib. Fractures of the left third, fourth, and sixth ribs. No vertebral compression deformities are demonstrated although large lucent lesions are demonstrated in the T1 and T2 vertebrae. IMPRESSION: 1. Multiple lucent lesions demonstrated in the ribs, spine, and shoulders corresponding to the  patient's history of multiple myeloma. 2. Multiple bilateral acute rib fractures. 3. Small bilateral pleural effusions with basilar atelectasis or consolidation, possibly pneumonia. Electronically Signed   By: Burman Nieves M.D.   On: 05/03/2023 01:21   DG Chest Port 1 View  Result Date: 05/02/2023 CLINICAL DATA:  Shortness of breath and cough. History of multiple myeloma. Right-sided pain. EXAM: PORTABLE CHEST 1 VIEW COMPARISON:  04/20/2023 FINDINGS: Shallow inspiration. Cardiac enlargement. No vascular congestion or edema. Linear atelectasis in the lung bases. No pleural effusions. No pneumothorax. Calcification of the aorta. A nondisplaced right lateral ninth rib fracture is suggested. IMPRESSION: Shallow inspiration with atelectasis in the lung bases. Cardiac enlargement. Probable right ninth rib fracture. Electronically Signed   By: Burman Nieves M.D.   On: 05/02/2023 23:45   CT BONE MARROW BIOPSY & ASPIRATION  Result Date: 04/27/2023 INDICATION: myeloma staging EXAM: CT GUIDED BONE MARROW ASPIRATION AND CORE BIOPSY MEDICATIONS: None. ANESTHESIA/SEDATION: Moderate (conscious) sedation was employed during this procedure. A total of Versed 2 mg and Fentanyl 100 mcg was administered intravenously. Moderate Sedation Time: 13 minutes. The patient's level of consciousness and vital signs were monitored continuously by radiology nursing throughout the procedure under my direct supervision. FLUOROSCOPY TIME:  CT dose; 122 mGycm COMPLICATIONS: None immediate. Estimated blood loss: <5 mL PROCEDURE: RADIATION DOSE REDUCTION: This exam was performed according to the departmental dose-optimization program which includes automated exposure control, adjustment of the mA and/or kV according to patient size and/or use of iterative reconstruction technique. Informed written consent was obtained from the patient after a thorough discussion of the procedural risks, benefits and alternatives. All questions were  addressed. Maximal Sterile Barrier Technique was utilized including caps, mask, sterile gowns, sterile gloves, sterile drape, hand hygiene and skin antiseptic. A timeout was performed prior  to the initiation of the procedure. The patient was positioned prone and non-contrast localization CT was performed of the pelvis to demonstrate the iliac marrow spaces. Maximal barrier sterile technique utilized including caps, mask, sterile gowns, sterile gloves, large sterile drape, hand hygiene, and chlorhexidine prep. Under sterile conditions and local anesthesia, an 11 gauge coaxial bone biopsy needle was advanced into the RIGHT iliac marrow space. Needle position was confirmed with CT imaging. Initially, bone marrow aspiration was performed. Next, the 11 gauge outer cannula was utilized to obtain a 1 iliac bone marrow core biopsy. Needle was removed. Hemostasis was obtained with compression. The patient tolerated the procedure well. Samples were prepared with the cytotechnologist. IMPRESSION: Successful CT-guided bone marrow aspiration and biopsy. Art Largo, MD [facility]iology Electronically Signed   By: Art Largo M.D.   On: 04/27/2023 12:32   DG Chest 2 View  Result Date: 04/23/2023 CLINICAL DATA:  Cough for 2 months Pain between shoulder blades EXAM: CHEST - 2 VIEW COMPARISON:  08/24/2019 FINDINGS: Cardiomediastinal silhouette and pulmonary vasculature are within normal limits. Lungs are hyperexpanded but otherwise clear. Unchanged dextroconvex curvature of the thoracolumbar spine. IMPRESSION: 1. No acute cardiopulmonary process. 2. Hyperexpanded lungs, suspicious for emphysema. Electronically Signed   By: Elester Grim M.D.   On: 04/23/2023 16:44      04/27/2023 Pathology Results   Surgical Pathology CASE: [id number] PATIENT: Lysbeth Sauger Bone Marrow Report  Clinical History: Myeloma  DIAGNOSIS:  BONE MARROW, ASPIRATE, CLOT,  CORE: -Hypercellular bone marrow with plasma cell neoplasm -See comment  PERIPHERAL BLOOD: -Normocytic-normochromic anemia -Leukocytosis with neutrophilic left shift  COMMENT:  The bone marrow is hypercellular for age with increased number of plasma cells averaging 15% of all cells in the aspirate.  The clot and biopsy sections show interstitial infiltrates, numerous variably sized clusters in addition to an area of diffuse sheet of atypical plasma cells associated with kappa light chain restriction consistent with plasma cell neoplasm.  Correlation with cytogenetic and FISH studies is recommended.  MICROSCOPIC DESCRIPTION:  PERIPHERAL BLOOD SMEAR: The red blood cells display mild anisopoikilocytosis with mild to moderate polychromasia.  The white blood cells are slightly increased in number, mostly with neutrophils some of which display toxic granulation.  An occasional myelocyte is seen on scan.  The platelets are normal in number.  BONE MARROW ASPIRATE: Bone marrow particles present Erythroid precursors: Orderly and progressive maturation Granulocytic precursors: Orderly and progressive maturation for the most part Megakaryocytes: Abundant with scattered small and/or hypolobated forms or large cells. Lymphocytes/plasma cells: The plasma cells are increased in number representing 15% to all cells associated with atypical cytologic features characterized by cytomegaly and/or small nucleoli.  Large lymphoid aggregates are not seen.  TOUCH PREPARATIONS: A mixture of cell types present but with increased number of plasma cells  CLOT AND BIOPSY: The sections show variable cellularity ranging from 40 to 90% focally.  There are numerous variably sized clusters of atypical plasma cells in addition to a diffuse large sheet particularly in the area of most cellularity.  The background shows trilineage hematopoiesis.  Immunohistochemical stains for CD138 in addition to in situ hybridization for kappa  and lambda were performed with appropriate controls.  CD138 highlights the increased plasma cell component consisting of interstitial infiltrates, numerous variably sized clusters, and an area of diffuse sheet particularly in the core biopsy. The plasma cells display kappa light chain restriction.  IRON STAIN: Iron stains are performed on a bone marrow aspirate or touch  imprint smear and section of clot. The controls stained appropriately.       Storage Iron: Decreased      Ring Sideroblasts: Absent  ADDITIONAL DATA/TESTING: The specimen was sent for cytogenetic analysis and FISH for multiple myeloma and a separate report will follow.    05/04/2023 Cancer Staging   Staging form: Plasma Cell Myeloma and Plasma Cell Disorders, AJCC 8th Edition - Clinical stage from 05/04/2023: RISS Stage II (Beta-2-microglobulin (mg/L): 5.6, Albumin (g/dL): 2.5, ISS: Stage III, High-risk cytogenetics: Absent, LDH: Normal) - Signed by [name], MD on 05/11/2023 Stage prefix: Initial diagnosis Beta 2 microglobulin range (mg/L): Greater than or equal to 5.5 Albumin range (g/dL): Less than 3.5 Cytogenetics: No abnormalities   05/14/2023 - 08/27/2023 Chemotherapy   Patient is on Treatment Plan : MYELOMA NEWLY DIAGNOSED TRANSPLANT CANDIDATE DaraVRd (Daratumumab SQ) q21d x 6 Cycles (Induction/Consolidation)     06/18/2023 Imaging   RIGHT:         - There is no evidence of deep vein thrombosis in the lower extremity.   - No cystic structure found in the popliteal fossa.   LEFT: - No evidence of common femoral vein obstruction.       09/02/2023 -  Chemotherapy   Patient is on Treatment Plan : MYELOMA RELAPSED/REFRACTORY KCd q28d x 6 cycles         INTERVAL HISTORY  Discussed the use of AI scribe software for clinical note transcription with the patient, who gave verbal consent to proceed.    Daniel Whitaker is a 78 y.o. adult with oncologic history as above presenting to [facility] today with chief  complaint of toxicity check. Patient is accompanied by family member who provides additional history.  He has been experiencing left arm pain for the past week, initially suspected to be a nerve issue. Relief is achieved by holding his arm up. He has undergone dry needling treatment across his shoulders and down his arm, which has improved the arm pain, although he is now sore from the needles. No swelling in the left arm. He denies any history of blood clots and is able to button his clothes and use his hand without difficulty.  He is currently taking oxycodone for pain management, with a dosage of 15 mg at night, which makes him sleepy. He has been taking 10 mg during the day due to the sedative effect of the higher dose. He also takes Lasix, 40 mg in the morning and 20 mg in the afternoon, to manage his weight and swelling. His weight is monitored at home, and he is currently around 216-217 pounds. He experiences some swelling in his legs, which is a known side effect of his treatment. He has good urine output while taking Lasix.  He has a history of constipation, which he is managing effectively with medication. No current issues with constipation, nausea, vomiting, or diarrhea. His appetite remains good.     ROS  All other systems are reviewed and are negative for acute change except as noted in the HPI.    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   Nocturnal leg cramps 03/11/2021   Nontoxic  uninodular goiter    Obesity    Peripheral neuropathy    RLS (restless legs syndrome)    Spinal stenosis    Unspecified deficiency anemia 07/11/2013     Past Surgical History:  Procedure Laterality Date   CARDIAC CATHETERIZATION     COLON RESECTION N/A 04/21/2013   Procedure: DIAGNOSTIC LAPAROSCOPY, lysis of adhesions for partial small bowel obstruction;  Surgeon: Harlee Lichtenstein, MD;  Location: [facility] ORS;  Service: General;  Laterality: N/A;   COLON SURGERY     partial SBO    COLONOSCOPY     CORONARY STENT INTERVENTION N/A 10/22/2021   Procedure: CORONARY STENT INTERVENTION;  Surgeon: Arleen Lacer, MD;  Location: [facility] INVASIVE CV LAB;  Service: Cardiovascular;  Laterality: N/A;   IR IMAGING GUIDED PORT INSERTION  09/02/2023   KNEE SURGERY     Arthroscopic   LEFT HEART CATH AND CORONARY ANGIOGRAPHY N/A 10/22/2021   Procedure: LEFT HEART CATH AND CORONARY ANGIOGRAPHY;  Surgeon: Arleen Lacer, MD;  Location: [facility] INVASIVE CV LAB;  Service: Cardiovascular;  Laterality: N/A;   SMALL INTESTINE SURGERY     Blockage    Social History   Socioeconomic History   Marital status: Married    Spouse name: Stana Ear   Number of children: 2   Years of education: GED   Highest education level: Not on file  Occupational History   Occupation: still working  Tobacco Use   Smoking status: Former    Current packs/day: 0.00    Types: Cigarettes    Quit date: 06/01/1984    Years since quitting: 39.3   Smokeless tobacco: Never  Vaping Use   Vaping status: Never Used  Substance and Sexual Activity   Alcohol use: Not Currently    Alcohol/week: 10.0 standard drinks of alcohol    Types: 5 Glasses of wine, 5 Cans of beer per week   Drug use: No   Sexual activity: Not on file  Other Topics Concern   Not on file  Social History Narrative   Patient lives at home with his wife Stana Ear)   Retired.    Patient has two children.   Patient is right-handed.   Patient has his GED.   Patient does not  drink caffeine.   Social Drivers of Corporate investment banker Strain: Not on file  Food Insecurity: No Food Insecurity (08/30/2023)   Hunger Vital Sign    Worried About Running Out of Food in the Last Year: Never true    Ran Out of Food in the Last Year: Never true  Transportation Needs: No Transportation Needs (08/30/2023)   PRAPARE - Administrator, Civil Service (Medical): No    Lack of Transportation (Non-Medical): No  Physical Activity: Not on file  Stress: Not on file  Social Connections: Moderately Integrated (08/30/2023)   Social Connection and Isolation Panel [NHANES]    Frequency of Communication with Friends and Family: Three times a week    Frequency of Social Gatherings with Friends and Family: Three times a week    Attends Religious Services: 1 to 4 times per year    Active Member of Clubs or Organizations: No    Attends Banker Meetings: Never    Marital Status: Married  Intimate  Partner Violence: Not At Risk (08/30/2023)   Humiliation, Afraid, Rape, and Kick questionnaire    Fear of Current or Ex-Partner: No    Emotionally Abused: No    Physically Abused: No    Sexually Abused: No    Family History  Problem Relation Age of Onset   Brain cancer Mother    Lung cancer Father    Heart disease Brother        No details   Dementia Brother    Neuropathy Neg Hx    Colon cancer Neg Hx    Colon polyps Neg Hx    Esophageal cancer Neg Hx    Rectal cancer Neg Hx    Stomach cancer Neg Hx    Sleep apnea Neg Hx      Current Outpatient Medications:    acyclovir (ZOVIRAX) 400 MG tablet, Take 1 tablet (400 mg total) by mouth daily., Disp: 30 tablet, Rfl: 6   albuterol (VENTOLIN HFA) 108 (90 Base) MCG/ACT inhaler, Inhale 1-2 puffs into the lungs every 6 (six) hours as needed for wheezing or shortness of breath., Disp: , Rfl:    allopurinol (ZYLOPRIM) 300 MG tablet, Take 1 tablet (300 mg total) by mouth daily., Disp: 30 tablet, Rfl: 0   apixaban  (ELIQUIS) 5 MG TABS tablet, Take 1 tablet (5 mg total) by mouth 2 (two) times daily., Disp: 60 tablet, Rfl: 2   brimonidine (ALPHAGAN) 0.2 % ophthalmic solution, Place 1 drop into the left eye in the morning and at bedtime., Disp: , Rfl:    cetirizine (ZYRTEC) 10 MG tablet, Take 10 mg by mouth every morning., Disp: , Rfl:    DENTA 5000 PLUS 1.1 % CREA dental cream, Place 1 Application onto teeth at bedtime., Disp: , Rfl:    dexamethasone (DECADRON) 4 MG tablet, Take 2-4 tablets (8-16 mg total) by mouth See admin instructions. Take 8mg  (2 tablets) by mouth daily except on Friday's. On Friday's, take 16mg  (4 tablets)., Disp: , Rfl:    docusate sodium (COLACE) 100 MG capsule, Take 300 mg by mouth daily., Disp: , Rfl:    dorzolamide-timolol (COSOPT) 22.3-6.8 MG/ML ophthalmic solution, Place 1 drop into both eyes 2 (two) times daily., Disp: , Rfl:    furosemide (LASIX) 40 MG tablet, Take 1 tablet (40 mg) in the morning and 1/2 tablet (20 mg) in the afternoon, Disp: 90 tablet, Rfl: 1   ketoconazole (NIZORAL) 2 % cream, Apply 1 application  topically daily as needed for irritation., Disp: , Rfl:    latanoprost (XALATAN) 0.005 % ophthalmic solution, Place 1 drop into both eyes at bedtime., Disp: , Rfl:    lidocaine (XYLOCAINE) 5 % ointment, Apply 1 Application topically 2 (two) times daily as needed., Disp: 35.44 g, Rfl: 1   lidocaine-prilocaine (EMLA) cream, Apply to affected area once, Disp: 30 g, Rfl: 3   morphine (MS CONTIN) 15 MG 12 hr tablet, Take 1 tablet (15 mg total) by mouth at bedtime., Disp: 30 tablet, Rfl: 0   Multiple Vitamins-Minerals (CENTRUM SILVER 50+MEN) TABS, Take 1 tablet by mouth daily with breakfast., Disp: , Rfl:    nitroGLYCERIN (NITROSTAT) 0.4 MG SL tablet, Place 1 tablet (0.4 mg total) under the tongue every 5 (five) minutes as needed., Disp: 25 tablet, Rfl: 2   ondansetron (ZOFRAN) 8 MG tablet, Take 1 tablet (8 mg total) by mouth every 8 (eight) hours as needed for nausea or  vomiting., Disp: 30 tablet, Rfl: 1   oxyCODONE (ROXICODONE) 15 MG immediate release tablet,  Take 1 tablet (15 mg total) by mouth every 4 (four) hours as needed for severe pain (pain score 7-10)., Disp: 90 tablet, Rfl: 0   pantoprazole (PROTONIX) 40 MG tablet, Take 40 mg by mouth daily before breakfast., Disp: , Rfl:    polyethylene glycol (MIRALAX / GLYCOLAX) 17 g packet, Take 17 g by mouth 2 (two) times daily., Disp: , Rfl:    pregabalin (LYRICA) 100 MG capsule, TAKE 1 CAPSULE BY MOUTH IN THE MORNING, AND AT NOON, AND 2 CAPSULES AT NIGHT (Patient taking differently: Take 100-200 mg by mouth See admin instructions. Take 100mg  (1 capsule) by mouth every morning and 200mg  (2 capsules) every night.), Disp: 120 capsule, Rfl: 5   prochlorperazine (COMPAZINE) 10 MG tablet, Take 1 tablet (10 mg total) by mouth every 6 (six) hours as needed for nausea or vomiting., Disp: 30 tablet, Rfl: 1   rosuvastatin (CRESTOR) 40 MG tablet, Take 40 mg by mouth in the morning and at bedtime., Disp: , Rfl:    senna-docusate (SENOKOT-S) 8.6-50 MG tablet, Take 2 tablets by mouth 2 (two) times daily. (Patient taking differently: Take 2 tablets by mouth 2 (two) times daily as needed for moderate constipation.), Disp: , Rfl:    SSD 1 % cream, Apply 1 Application topically daily., Disp: , Rfl:    triamcinolone (NASACORT) 55 MCG/ACT AERO nasal inhaler, Place 1 spray into the nose daily as needed., Disp: , Rfl:    TYLENOL 500 MG tablet, Take 1,000 mg by mouth every 6 (six) hours as needed for mild pain (pain score 1-3) or headache., Disp: , Rfl:   PHYSICAL EXAM ECOG FS:1 - Symptomatic but completely ambulatory    Vitals:   09/16/23 0926  BP: 138/73  Pulse: 76  Resp: 15  Temp: 98.4 F (36.9 C)  TempSrc: Temporal  SpO2: 95%  Weight: 217 lb 9.6 oz (98.7 kg)   Physical Exam Vitals and nursing note reviewed.  Constitutional:      Appearance: He is not ill-appearing or toxic-appearing.  HENT:     Head: Normocephalic.   Eyes:     Conjunctiva/sclera: Conjunctivae normal.  Cardiovascular:     Rate and Rhythm: Normal rate and regular rhythm.     Pulses: Normal pulses.          Radial pulses are 2+ on the left side.     Heart sounds: Normal heart sounds.  Pulmonary:     Effort: Pulmonary effort is normal.     Breath sounds: Normal breath sounds.  Abdominal:     General: There is no distension.  Musculoskeletal:     Cervical back: Normal range of motion.     Right lower leg: 1+ Pitting Edema present.     Left lower leg: 1+ Pitting Edema present.     Comments: No swelling noted to left upper extremity. Full ROM. Compartments are soft.  Equal tactile temperature in all extremities.  Skin:    General: Skin is warm and dry.  Neurological:     Mental Status: He is alert.     Comments: Decreased sensation in left fingers 3, 4, and 5        LABORATORY DATA I have reviewed the data as listed    Latest Ref Rng & Units 09/16/2023    9:01 AM 09/07/2023    9:34 AM 09/03/2023    5:38 AM  CBC  WBC 4.0 - 10.5 K/uL 4.3  11.8  7.0   Hemoglobin 13.0 - 17.0 g/dL 9.2  9.4  7.8   Hematocrit 39.0 - 52.0 % 29.2  29.5  25.6   Platelets 150 - 400 K/uL 160  223  230         Latest Ref Rng & Units 09/16/2023    9:01 AM 09/07/2023    9:34 AM 09/03/2023    5:38 AM  CMP  Glucose 70 - 99 mg/dL 97  433  295   BUN 8 - 23 mg/dL 28  41  42   Creatinine 0.61 - 1.24 mg/dL 1.88  4.16  6.06   Sodium 135 - 145 mmol/L 142  141  135   Potassium 3.5 - 5.1 mmol/L 3.6  3.2  3.7   Chloride 98 - 111 mmol/L 109  107  101   CO2 22 - 32 mmol/L 29  27  26    Calcium 8.9 - 10.3 mg/dL 8.7  8.3  8.6   Total Protein 6.5 - 8.1 g/dL 6.6  8.3    Total Bilirubin 0.0 - 1.2 mg/dL 0.4  0.4    Alkaline Phos 38 - 126 U/L 166  80    AST 15 - 41 U/L 9  14    ALT 0 - 44 U/L 10  17         RADIOGRAPHIC STUDIES (from last 24 hours if applicable) I have personally reviewed the radiological images as listed and agreed with the findings in the  report. No results found.      Visit Diagnosis: 1. Left arm pain   2. Multiple myeloma not having achieved remission (HCC)   3. Other constipation   4. Anemia, chronic disease   5. Cancer related pain      No orders of the defined types were placed in this encounter.   All questions were answered. The patient knows to call the clinic with any problems, questions or concerns. No barriers to learning was detected.  A total of more than 30 minutes were spent on this encounter with face-to-face time and non-face-to-face time, including preparing to see the patient, ordering tests and/or medications, counseling the patient and coordination of care as outlined above.    Thank you for allowing me to participate in the care of this patient.    Torie Towle E  Walisiewicz, PA-C Department of Hematology/Oncology [facility] Phone: [phone]  [phone]    09/16/2023 10:03 AM

## 2023-09-16 ENCOUNTER — Inpatient Hospital Stay (HOSPITAL_BASED_OUTPATIENT_CLINIC_OR_DEPARTMENT_OTHER): Admitting: Physician Assistant

## 2023-09-16 ENCOUNTER — Encounter: Payer: Self-pay | Admitting: Hematology and Oncology

## 2023-09-16 ENCOUNTER — Inpatient Hospital Stay

## 2023-09-16 ENCOUNTER — Ambulatory Visit (HOSPITAL_COMMUNITY)
Admission: RE | Admit: 2023-09-16 | Discharge: 2023-09-16 | Disposition: A | Discharge status: Home / Self Care | Source: Ambulatory Visit | Attending: Physician Assistant | Admitting: Physician Assistant

## 2023-09-16 VITALS — BP 138/73 | HR 76 | Temp 98.4°F | Resp 15 | Wt 217.6 lb

## 2023-09-16 VITALS — BP 118/60 | HR 63 | Temp 98.3°F | Resp 20

## 2023-09-16 DIAGNOSIS — M79602 Pain in left arm: Secondary | ICD-10-CM

## 2023-09-16 DIAGNOSIS — Z5112 Encounter for antineoplastic immunotherapy: Secondary | ICD-10-CM | POA: Diagnosis not present

## 2023-09-16 DIAGNOSIS — D638 Anemia in other chronic diseases classified elsewhere: Secondary | ICD-10-CM

## 2023-09-16 DIAGNOSIS — K5909 Other constipation: Secondary | ICD-10-CM

## 2023-09-16 DIAGNOSIS — C9000 Multiple myeloma not having achieved remission: Secondary | ICD-10-CM

## 2023-09-16 DIAGNOSIS — G893 Neoplasm related pain (acute) (chronic): Secondary | ICD-10-CM | POA: Diagnosis not present

## 2023-09-16 DIAGNOSIS — Z79899 Other long term (current) drug therapy: Secondary | ICD-10-CM | POA: Diagnosis not present

## 2023-09-16 DIAGNOSIS — D539 Nutritional anemia, unspecified: Secondary | ICD-10-CM | POA: Diagnosis not present

## 2023-09-16 LAB — CBC WITH DIFFERENTIAL (CANCER CENTER ONLY)
Abs Immature Granulocytes: 0.02 10*3/uL (ref 0.00–0.07)
Basophils Absolute: 0 10*3/uL (ref 0.0–0.1)
Basophils Relative: 1 %
Eosinophils Absolute: 0.1 10*3/uL (ref 0.0–0.5)
Eosinophils Relative: 1 %
HCT: 29.2 % — ABNORMAL LOW (ref 39.0–52.0)
Hemoglobin: 9.2 g/dL — ABNORMAL LOW (ref 13.0–17.0)
Immature Granulocytes: 1 %
Lymphocytes Relative: 28 %
Lymphs Abs: 1.2 10*3/uL (ref 0.7–4.0)
MCH: 27.5 pg (ref 26.0–34.0)
MCHC: 31.5 g/dL (ref 30.0–36.0)
MCV: 87.4 fL (ref 80.0–100.0)
Monocytes Absolute: 0.3 10*3/uL (ref 0.1–1.0)
Monocytes Relative: 7 %
Neutro Abs: 2.7 10*3/uL (ref 1.7–7.7)
Neutrophils Relative %: 62 %
Platelet Count: 160 10*3/uL (ref 150–400)
RBC: 3.34 MIL/uL — ABNORMAL LOW (ref 4.22–5.81)
RDW: 17.9 % — ABNORMAL HIGH (ref 11.5–15.5)
WBC Count: 4.3 10*3/uL (ref 4.0–10.5)
nRBC: 0 % (ref 0.0–0.2)

## 2023-09-16 LAB — CMP (CANCER CENTER ONLY)
ALT: 10 U/L (ref 0–44)
AST: 9 U/L — ABNORMAL LOW (ref 15–41)
Albumin: 3.3 g/dL — ABNORMAL LOW (ref 3.5–5.0)
Alkaline Phosphatase: 166 U/L — ABNORMAL HIGH (ref 38–126)
Anion gap: 4 — ABNORMAL LOW (ref 5–15)
BUN: 28 mg/dL — ABNORMAL HIGH (ref 8–23)
CO2: 29 mmol/L (ref 22–32)
Calcium: 8.7 mg/dL — ABNORMAL LOW (ref 8.9–10.3)
Chloride: 109 mmol/L (ref 98–111)
Creatinine: 0.73 mg/dL (ref 0.61–1.24)
GFR, Estimated: >60 mL/min (ref >60)
Glucose, Bld: 97 mg/dL (ref 70–99)
Potassium: 3.6 mmol/L (ref 3.5–5.1)
Sodium: 142 mmol/L (ref 135–145)
Total Bilirubin: 0.4 mg/dL (ref 0.0–1.2)
Total Protein: 6.6 g/dL (ref 6.5–8.1)

## 2023-09-16 MED ORDER — CYCLOPHOSPHAMIDE CHEMO INJECTION 1 GM
500.0000 mg | Freq: Once | INTRAMUSCULAR | Status: AC
  Administered 2023-09-16: 500 mg via INTRAVENOUS
  Filled 2023-09-16: qty 25

## 2023-09-16 MED ORDER — SODIUM CHLORIDE 0.9 % IV SOLN
Freq: Once | INTRAVENOUS | Status: AC
Start: 2023-09-16 — End: 2023-09-16

## 2023-09-16 MED ORDER — SODIUM CHLORIDE 0.9 % IV SOLN: INTRAVENOUS | Status: DC

## 2023-09-16 MED ORDER — SODIUM CHLORIDE 0.9 % IV SOLN
20.0000 mg | Freq: Once | INTRAVENOUS | Status: AC
  Administered 2023-09-16: 20 mg via INTRAVENOUS
  Filled 2023-09-16: qty 20

## 2023-09-16 MED ORDER — DEXTROSE 5 % IV SOLN
36.0000 mg/m2 | Freq: Once | INTRAVENOUS | Status: AC
  Administered 2023-09-16: 80 mg via INTRAVENOUS
  Filled 2023-09-16: qty 30

## 2023-09-16 MED ORDER — HEPARIN SOD (PORK) LOCK FLUSH 100 UNIT/ML IV SOLN
500.0000 [IU] | Freq: Once | INTRAVENOUS | Status: AC | PRN
  Administered 2023-09-16: 500 [IU]

## 2023-09-16 MED ORDER — SODIUM CHLORIDE 0.9% FLUSH
10.0000 mL | Freq: Once | INTRAVENOUS | Status: AC
  Administered 2023-09-16: 10 mL

## 2023-09-16 MED ORDER — SODIUM CHLORIDE 0.9% FLUSH
10.0000 mL | INTRAVENOUS | Status: DC | PRN
  Administered 2023-09-16: 10 mL

## 2023-09-16 NOTE — Patient Instructions (Signed)
 [facility] - A DEPT OF [facility]  Discharge Instructions: Thank you for choosing [facility] to provide your oncology and hematology care.   If you have a lab appointment with the Cancer Center, please go directly to the Cancer Center and check in at the registration area.   Wear comfortable clothing and clothing appropriate for easy access to any Portacath or PICC line.   We strive to give you quality time with your provider. You may need to reschedule your appointment if you arrive late (15 or more minutes).  Arriving late affects you and other patients whose appointments are after yours.  Also, if you miss three or more appointments without notifying the office, you may be dismissed from the clinic at the provider's discretion.      For prescription refill requests, have your pharmacy contact our office and allow 72 hours for refills to be completed.    Today you received the following chemotherapy and/or immunotherapy agents: Cyclophosphamide (Cytoxan) and Carfilzomib (Kyprolis)     To help prevent nausea and vomiting after your treatment, we encourage you to take your nausea medication as directed.  BELOW ARE SYMPTOMS THAT SHOULD BE REPORTED IMMEDIATELY: *FEVER GREATER THAN 100.4 F (38 C) OR HIGHER *CHILLS OR SWEATING *NAUSEA AND VOMITING THAT IS NOT CONTROLLED WITH YOUR NAUSEA MEDICATION *UNUSUAL SHORTNESS OF BREATH *UNUSUAL BRUISING OR BLEEDING *URINARY PROBLEMS (pain or burning when urinating, or frequent urination) *BOWEL PROBLEMS (unusual diarrhea, constipation, pain near the anus) TENDERNESS IN MOUTH AND THROAT WITH OR WITHOUT PRESENCE OF ULCERS (sore throat, sores in mouth, or a toothache) UNUSUAL RASH, SWELLING OR PAIN  UNUSUAL VAGINAL DISCHARGE OR ITCHING   Items with * indicate a potential emergency and should be followed up as soon as possible or go to the Emergency Department if any problems should occur.  Please show  the CHEMOTHERAPY ALERT CARD or IMMUNOTHERAPY ALERT CARD at check-in to the Emergency Department and triage nurse.  Should you have questions after your visit or need to cancel or reschedule your appointment, please contact [facility] - A DEPT OF [facility]  Dept: [phone]  and follow the prompts.  Office hours are 8:00 a.m. to 4:30 p.m. Monday - Friday. Please note that voicemails left after 4:00 p.m. may not be returned until the following business day.  We are closed weekends and major holidays. You have access to a nurse at all times for urgent questions. Please call the main number to the clinic Dept: [phone] and follow the prompts.   For any non-urgent questions, you may also contact your provider using MyChart. We now offer e-Visits for anyone 19 and older to request care online for non-urgent symptoms. For details visit [email or web address].   Also download the MyChart app! Go to the app store, search "MyChart", open the app, select [facility], and log in with your MyChart username and password.

## 2023-09-16 NOTE — Progress Notes (Signed)
 Pt scheduled for Doppler today at [facility] at 4:00 PM. This RN made Pt aware. Pt agreeable and verbalized understanding.

## 2023-09-17 ENCOUNTER — Ambulatory Visit (HOSPITAL_COMMUNITY)
Admission: RE | Admit: 2023-09-17 | Discharge: 2023-09-17 | Disposition: A | Discharge status: Home / Self Care | Source: Ambulatory Visit | Attending: Physician Assistant | Admitting: Physician Assistant

## 2023-09-17 ENCOUNTER — Inpatient Hospital Stay

## 2023-09-17 VITALS — BP 130/55 | HR 53 | Temp 97.6°F | Resp 18 | Wt 219.4 lb

## 2023-09-17 DIAGNOSIS — Z5112 Encounter for antineoplastic immunotherapy: Secondary | ICD-10-CM | POA: Diagnosis not present

## 2023-09-17 DIAGNOSIS — M79602 Pain in left arm: Secondary | ICD-10-CM | POA: Insufficient documentation

## 2023-09-17 DIAGNOSIS — C9000 Multiple myeloma not having achieved remission: Secondary | ICD-10-CM | POA: Diagnosis not present

## 2023-09-17 DIAGNOSIS — Z79899 Other long term (current) drug therapy: Secondary | ICD-10-CM | POA: Diagnosis not present

## 2023-09-17 DIAGNOSIS — D539 Nutritional anemia, unspecified: Secondary | ICD-10-CM | POA: Diagnosis not present

## 2023-09-17 DIAGNOSIS — G893 Neoplasm related pain (acute) (chronic): Secondary | ICD-10-CM | POA: Diagnosis not present

## 2023-09-17 MED ORDER — SODIUM CHLORIDE 0.9 % IV SOLN: Freq: Once | INTRAVENOUS | Status: AC

## 2023-09-17 MED ORDER — DEXTROSE 5 % IV SOLN
36.0000 mg/m2 | Freq: Once | INTRAVENOUS | Status: AC
  Administered 2023-09-17: 80 mg via INTRAVENOUS
  Filled 2023-09-17: qty 30

## 2023-09-17 MED ORDER — SODIUM CHLORIDE 0.9 % IV SOLN: INTRAVENOUS | Status: DC

## 2023-09-17 MED ORDER — HEPARIN SOD (PORK) LOCK FLUSH 100 UNIT/ML IV SOLN
500.0000 [IU] | Freq: Once | INTRAVENOUS | Status: AC | PRN
  Administered 2023-09-17: 500 [IU]

## 2023-09-17 MED ORDER — SODIUM CHLORIDE 0.9% FLUSH
10.0000 mL | INTRAVENOUS | Status: DC | PRN
Start: 2023-09-17 — End: 2023-09-17
  Administered 2023-09-17: 10 mL

## 2023-09-17 MED ORDER — ACETAMINOPHEN 500 MG PO TABS
1000.0000 mg | ORAL_TABLET | Freq: Once | ORAL | Status: AC
  Administered 2023-09-17: 1000 mg via ORAL
  Filled 2023-09-17: qty 2

## 2023-09-17 MED ORDER — PROCHLORPERAZINE MALEATE 10 MG PO TABS
10.0000 mg | ORAL_TABLET | Freq: Once | ORAL | Status: AC
  Administered 2023-09-17: 10 mg via ORAL
  Filled 2023-09-17: qty 1

## 2023-09-17 NOTE — Patient Instructions (Signed)

## 2023-09-21 ENCOUNTER — Encounter (INDEPENDENT_AMBULATORY_CARE_PROVIDER_SITE_OTHER): Payer: Self-pay

## 2023-09-21 ENCOUNTER — Ambulatory Visit (INDEPENDENT_AMBULATORY_CARE_PROVIDER_SITE_OTHER)

## 2023-09-21 VITALS — BP 163/82 | HR 74 | Ht 72.0 in | Wt 212.0 lb

## 2023-09-21 DIAGNOSIS — H9012 Conductive hearing loss, unilateral, left ear, with unrestricted hearing on the contralateral side: Secondary | ICD-10-CM | POA: Diagnosis not present

## 2023-09-21 NOTE — Patient Instructions (Signed)
 I have ordered an imaging study for you to complete prior to your next visit. Please call [facility] Scheduling at [phone] to schedule your imaging if you have not received a call within 24 hours. If you are unable to complete your imaging study prior to your next scheduled visit please call our office to let us know.

## 2023-09-21 NOTE — Progress Notes (Signed)
 Dear Dr. Bernetta Brilliant, Here is my assessment for our mutual patient, Daniel Whitaker. Thank you for allowing me the opportunity to care for your patient. Please do not hesitate to contact me should you have any other questions. Sincerely, [name] PA-C  Otolaryngology Clinic Note Referring provider: Dr. Bernetta Brilliant HPI:  Daniel Whitaker is a 78 y.o. adult kindly referred by Dr. Bernetta Brilliant   The patient is a 78 year old male seen in our office for evaluation of left-sided ear pressure.  The patient is companied by his daughter who helps provide some history.  They note that approximately 2 weeks ago he went to get his hearing aids checked at that time he was told he had fluid behind his left ear.  They notes that approximate 4 months ago he was diagnosed with pneumonia after an upper respiratory illness, he had some nasal congestion that has been on and off since that time.  He does have history of seasonal allergies and has been using over-the-counter Zyrtec, Nasacort  daily, he notes some continued nasal congestion.  He notes some discomfort in the ear no severe pain, he denies any drainage, dizziness, or any neuro related symptoms.  Has any history of head or neck trauma or surgery, he does have a history of multiple myeloma.  He did have a recent change in chemotherapy after having a port placed 3 weeks ago.  He had a CT of his head on 08/29/2023 with no acute intracranial process but did note a left mastoid effusion, also notable was lytic lesions seen through the calvarium increased since prior exam consistent with progression of multiple myeloma. He notes a longstanding history of hearing loss, he notes that generally his left ear is weaker when compared to the left, he started using hearing aids approximately 6 to 7 years ago.   Independent Review of Additional Tests or Records:  CT head without contrast on 08/29/2023 showing left mastoid effusion  Audiological evaluation on 09/15/2023 at [facility], results show  normal right tympanic membrane with middle ear pressure and canal volume, no tympanic membrane mobility or middle ear pressure in the left ear consistent with type B, pure-tone testing in the right ear showed mild severe sensorineural sloping hearing loss, left ear showed moderate severe mixed sloping hearing loss.        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   Nocturnal leg cramps 03/11/2021   Nontoxic uninodular goiter    Obesity    Peripheral neuropathy    RLS (restless legs syndrome)    Spinal stenosis    Unspecified deficiency anemia 07/11/2013     Past Surgical History:  Procedure Laterality Date   CARDIAC CATHETERIZATION     COLON RESECTION N/A 04/21/2013   Procedure: DIAGNOSTIC LAPAROSCOPY, lysis of adhesions for partial small bowel obstruction;  Surgeon: Harlee Lichtenstein, MD;  Location: [facility];  Service: General;  Laterality: N/A;   COLON SURGERY     partial SBO    COLONOSCOPY     CORONARY STENT INTERVENTION N/A 10/22/2021   Procedure: CORONARY STENT INTERVENTION;  Surgeon: Arleen Lacer, MD;  Location: [facility] INVASIVE CV LAB;  Service: Cardiovascular;  Laterality: N/A;   IR IMAGING GUIDED PORT INSERTION  09/02/2023   KNEE SURGERY     Arthroscopic   LEFT HEART CATH AND CORONARY ANGIOGRAPHY N/A 10/22/2021   Procedure: LEFT HEART CATH AND CORONARY ANGIOGRAPHY;  Surgeon: Arleen Lacer, MD;  Location: [facility] INVASIVE CV LAB;  Service: Cardiovascular;  Laterality: N/A;   SMALL INTESTINE SURGERY     Blockage    Family History  Problem Relation Age of Onset   Brain cancer Mother    Lung cancer Father    Heart disease Brother        No details    Dementia Brother    Neuropathy Neg Hx    Colon cancer Neg Hx    Colon polyps Neg Hx    Esophageal cancer Neg Hx    Rectal cancer Neg Hx    Stomach cancer Neg Hx    Sleep apnea Neg Hx      Social Connections: Moderately Integrated (08/30/2023)   Social Connection and Isolation Panel [NHANES]    Frequency of Communication with Friends and Family: Three times a week    Frequency of Social Gatherings with Friends and Family: Three times a week    Attends Religious Services: 1 to 4 times per year    Active Member of Clubs or Organizations: No    Attends Banker Meetings: Never    Marital Status: Married      Current Outpatient Medications:    acyclovir  (ZOVIRAX ) 400 MG tablet, Take 1 tablet (400 mg total) by mouth daily., Disp: 30 tablet, Rfl: 6   albuterol  (VENTOLIN  HFA) 108 (90 Base) MCG/ACT inhaler, Inhale 1-2 puffs into the lungs every 6 (six) hours as needed for wheezing or shortness of breath., Disp: , Rfl:    allopurinol  (ZYLOPRIM ) 300 MG tablet, Take 1 tablet (300 mg total) by mouth daily., Disp: 30 tablet, Rfl: 0   apixaban  (ELIQUIS ) 5 MG TABS tablet, Take 1 tablet (5 mg total) by mouth 2 (two) times daily., Disp: 60 tablet, Rfl: 2   brimonidine  (ALPHAGAN ) 0.2 % ophthalmic solution, Place 1 drop into the left eye in the morning and at bedtime., Disp: , Rfl:    cetirizine (ZYRTEC) 10 MG tablet, Take 10 mg by mouth every morning., Disp: , Rfl:    DENTA 5000 PLUS 1.1 % CREA dental cream, Place 1 Application onto teeth at bedtime., Disp: , Rfl:    dexamethasone  (DECADRON ) 4 MG tablet, Take 2-4 tablets (8-16 mg total) by mouth See admin instructions. Take 8mg  (2 tablets) by mouth daily except on Friday's. On Friday's, take 16mg  (4 tablets)., Disp: , Rfl:    docusate sodium  (COLACE) 100 MG capsule, Take 300 mg by mouth daily., Disp: , Rfl:    dorzolamide -timolol  (COSOPT ) 22.3-6.8 MG/ML ophthalmic solution, Place 1 drop into both eyes 2 (two) times daily., Disp: , Rfl:     furosemide  (LASIX ) 40 MG tablet, Take 1 tablet (40 mg) in the morning and 1/2 tablet (20 mg) in the afternoon, Disp: 90 tablet, Rfl: 1   ketoconazole (NIZORAL) 2 % cream, Apply 1 application  topically daily as needed for irritation., Disp: , Rfl:    latanoprost  (XALATAN ) 0.005 % ophthalmic solution, Place 1 drop into both eyes at bedtime., Disp: , Rfl:    lidocaine  (XYLOCAINE ) 5 % ointment, Apply 1 Application topically 2 (two) times daily as needed., Disp: 35.44 g, Rfl: 1   lidocaine -prilocaine  (EMLA ) cream, Apply to affected area once, Disp: 30 g, Rfl: 3  morphine  (MS CONTIN ) 15 MG 12 hr tablet, Take 1 tablet (15 mg total) by mouth at bedtime., Disp: 30 tablet, Rfl: 0   Multiple Vitamins-Minerals (CENTRUM SILVER 50+MEN) TABS, Take 1 tablet by mouth daily with breakfast., Disp: , Rfl:    nitroGLYCERIN  (NITROSTAT ) 0.4 MG SL tablet, Place 1 tablet (0.4 mg total) under the tongue every 5 (five) minutes as needed., Disp: 25 tablet, Rfl: 2   ondansetron  (ZOFRAN ) 8 MG tablet, Take 1 tablet (8 mg total) by mouth every 8 (eight) hours as needed for nausea or vomiting., Disp: 30 tablet, Rfl: 1   oxyCODONE  (ROXICODONE ) 15 MG immediate release tablet, Take 1 tablet (15 mg total) by mouth every 4 (four) hours as needed for severe pain (pain score 7-10)., Disp: 90 tablet, Rfl: 0   pantoprazole  (PROTONIX ) 40 MG tablet, Take 40 mg by mouth daily before breakfast., Disp: , Rfl:    polyethylene glycol (MIRALAX  / GLYCOLAX ) 17 g packet, Take 17 g by mouth 2 (two) times daily., Disp: , Rfl:    pregabalin  (LYRICA ) 100 MG capsule, TAKE 1 CAPSULE BY MOUTH IN THE MORNING, AND AT NOON, AND 2 CAPSULES AT NIGHT (Patient taking differently: Take 100-200 mg by mouth See admin instructions. Take 100mg  (1 capsule) by mouth every morning and 200mg  (2 capsules) every night.), Disp: 120 capsule, Rfl: 5   prochlorperazine  (COMPAZINE ) 10 MG tablet, Take 1 tablet (10 mg total) by mouth every 6 (six) hours as needed for nausea or  vomiting., Disp: 30 tablet, Rfl: 1   rosuvastatin  (CRESTOR ) 40 MG tablet, Take 40 mg by mouth in the morning and at bedtime., Disp: , Rfl:    senna-docusate (SENOKOT-S) 8.6-50 MG tablet, Take 2 tablets by mouth 2 (two) times daily. (Patient taking differently: Take 2 tablets by mouth 2 (two) times daily as needed for moderate constipation.), Disp: , Rfl:    SSD 1 % cream, Apply 1 Application topically daily., Disp: , Rfl:    triamcinolone  (NASACORT ) 55 MCG/ACT AERO nasal inhaler, Place 1 spray into the nose daily as needed., Disp: , Rfl:    TYLENOL  500 MG tablet, Take 1,000 mg by mouth every 6 (six) hours as needed for mild pain (pain score 1-3) or headache., Disp: , Rfl:    Physical Exam:   BP (!) 163/82 (BP Location: Left Arm, Patient Position: Sitting, Cuff Size: Normal)   Pulse 74   Ht 6' (1.829 m)   Wt 212 lb (96.2 kg)   SpO2 90%   BMI 28.75 kg/m   Pertinent Findings  CN II-XII intact Bilateral EAC clear, right TM intact with well pneumatized middle ear, left TM intact with serous effusion Weber 512: localizes left Rinne 512: AC= BC  Anterior rhinoscopy: Septum left deviation ; bilateral inferior turbinates with minimal hypertrophy No lesions of oral cavity/oropharynx; dentition wnl No obviously palpable neck masses/lymphadenopathy/thyromegaly No respiratory distress or stridor   Seprately Identifiable Procedures:  None  Impression & Plans:  Daniel Whitaker is a 78 y.o. adult with the following   Left-sided conductive hearing loss-  Daniel Whitaker presents today for evaluation of left-sided hearing loss.  This does appear to be mixed with significant conductive hearing loss on the left with type B tympanometry.  The patient has serous effusion on the left.  He did have recent upper respiratory infection, he also has mastoid effusion which would be consistent with recent infection as well.  Given the findings above I would recommend CT temporal bone with and without contrast for  further evaluation.  I would recommend the patient follow-up 2 weeks after the CT scan for repeat evaluation at that time I will consider nasal endoscopy as well.  At his request I discussed today's evaluation with his granddaughter who helps coordinate the care.  She will help arrange the CT scan and follow-up evaluation.  They will call with any changes to his care.   - f/u 2-week follow-up after CT temporal bone with and without, consider nasal endoscopy at that visit    Thank you for allowing me the opportunity to care for your patient. Please do not hesitate to contact me should you have any other questions.  Sincerely, [name] PA-C [facility] Phone: [phone] Fax: [phone]  09/21/2023, 9:44 AM

## 2023-09-22 ENCOUNTER — Inpatient Hospital Stay

## 2023-09-22 ENCOUNTER — Encounter: Payer: Self-pay | Admitting: Hematology and Oncology

## 2023-09-22 ENCOUNTER — Inpatient Hospital Stay: Admitting: Hematology and Oncology

## 2023-09-22 VITALS — BP 149/55 | HR 66 | Temp 99.0°F | Resp 18 | Ht 72.0 in | Wt 215.0 lb

## 2023-09-22 DIAGNOSIS — C9000 Multiple myeloma not having achieved remission: Secondary | ICD-10-CM

## 2023-09-22 DIAGNOSIS — G893 Neoplasm related pain (acute) (chronic): Secondary | ICD-10-CM | POA: Diagnosis not present

## 2023-09-22 DIAGNOSIS — E8770 Fluid overload, unspecified: Secondary | ICD-10-CM | POA: Diagnosis not present

## 2023-09-22 DIAGNOSIS — D539 Nutritional anemia, unspecified: Secondary | ICD-10-CM | POA: Diagnosis not present

## 2023-09-22 DIAGNOSIS — Z5112 Encounter for antineoplastic immunotherapy: Secondary | ICD-10-CM | POA: Diagnosis not present

## 2023-09-22 DIAGNOSIS — Z79899 Other long term (current) drug therapy: Secondary | ICD-10-CM | POA: Diagnosis not present

## 2023-09-22 LAB — CBC WITH DIFFERENTIAL/PLATELET
Abs Immature Granulocytes: 0.04 10*3/uL (ref 0.00–0.07)
Basophils Absolute: 0 10*3/uL (ref 0.0–0.1)
Basophils Relative: 1 %
Eosinophils Absolute: 0 10*3/uL (ref 0.0–0.5)
Eosinophils Relative: 1 %
HCT: 28.7 % — ABNORMAL LOW (ref 39.0–52.0)
Hemoglobin: 9.3 g/dL — ABNORMAL LOW (ref 13.0–17.0)
Immature Granulocytes: 1 %
Lymphocytes Relative: 8 %
Lymphs Abs: 0.5 10*3/uL — ABNORMAL LOW (ref 0.7–4.0)
MCH: 28 pg (ref 26.0–34.0)
MCHC: 32.4 g/dL (ref 30.0–36.0)
MCV: 86.4 fL (ref 80.0–100.0)
Monocytes Absolute: 0.3 10*3/uL (ref 0.1–1.0)
Monocytes Relative: 4 %
Neutro Abs: 5.6 10*3/uL (ref 1.7–7.7)
Neutrophils Relative %: 85 %
Platelets: 224 10*3/uL (ref 150–400)
RBC: 3.32 MIL/uL — ABNORMAL LOW (ref 4.22–5.81)
RDW: 18.5 % — ABNORMAL HIGH (ref 11.5–15.5)
WBC: 6.5 10*3/uL (ref 4.0–10.5)
nRBC: 0 % (ref 0.0–0.2)

## 2023-09-22 LAB — COMPREHENSIVE METABOLIC PANEL WITH GFR
ALT: 10 U/L (ref 0–44)
AST: 9 U/L — ABNORMAL LOW (ref 15–41)
Albumin: 3.5 g/dL (ref 3.5–5.0)
Alkaline Phosphatase: 253 U/L — ABNORMAL HIGH (ref 38–126)
Anion gap: 4 — ABNORMAL LOW (ref 5–15)
BUN: 27 mg/dL — ABNORMAL HIGH (ref 8–23)
CO2: 28 mmol/L (ref 22–32)
Calcium: 8.3 mg/dL — ABNORMAL LOW (ref 8.9–10.3)
Chloride: 109 mmol/L (ref 98–111)
Creatinine, Ser: 0.72 mg/dL (ref 0.61–1.24)
GFR, Estimated: >60 mL/min (ref >60)
Glucose, Bld: 114 mg/dL — ABNORMAL HIGH (ref 70–99)
Potassium: 3.6 mmol/L (ref 3.5–5.1)
Sodium: 141 mmol/L (ref 135–145)
Total Bilirubin: 0.4 mg/dL (ref 0.0–1.2)
Total Protein: 6.1 g/dL — ABNORMAL LOW (ref 6.5–8.1)

## 2023-09-22 MED ORDER — SODIUM CHLORIDE 0.9% FLUSH
10.0000 mL | Freq: Once | INTRAVENOUS | Status: AC
  Administered 2023-09-22: 10 mL

## 2023-09-22 MED ORDER — HEPARIN SOD (PORK) LOCK FLUSH 100 UNIT/ML IV SOLN
500.0000 [IU] | Freq: Once | INTRAVENOUS | Status: AC
  Administered 2023-09-22: 500 [IU]

## 2023-09-22 MED ORDER — DEXAMETHASONE 4 MG PO TABS: ORAL_TABLET | ORAL | Status: DC

## 2023-09-22 NOTE — Assessment & Plan Note (Addendum)
 Multifactorial This is stable Monitor closely He will receive a unit of blood if hemoglobin is less than 8 He does not need blood transfusion today

## 2023-09-22 NOTE — Assessment & Plan Note (Addendum)
 He has new onset of back pain, pain on the left wrist and other areas in multiple sites I recommend PET/CT imaging to stage his disease and rule out fractures

## 2023-09-22 NOTE — Assessment & Plan Note (Addendum)
 He has IgA kappa multiple myeloma with multiple bone fractures at presentation in 2024.  Bone marrow biopsy showed 15% involvement, standard risk myeloma FISH and cytogenetics  He ha initially good partial response with combination chemotherapy with daratumumab , lenalidomide , bortezomib  and dexamethasone  but then started to have progression of disease Treatment was switched to combination treatment with carfilzomib , cyclophosphamide  and dexamethasone  Recent myeloma panel was shared with the patient which show positive response to therapy  He will continue vitamin D  supplement He is on acyclovir  for antimicrobial prophylaxis He had received Zometa  on April 1, next dose will be due in July I recommend he continue on high-dose dexamethasone  daily at 2 mg daily except for Friday and Tuesday next week.  I plan to reduce IV dexamethasone  next week to 10 mg I will check out referral made to [facility] last month

## 2023-09-22 NOTE — Progress Notes (Signed)
 [facility] OFFICE PROGRESS NOTE  Patient Care Team: Glena Landau, MD as PCP - General (Family Medicine) Eilleen Grates, MD as PCP - Cardiology (Cardiology)  Assessment & Plan Multiple myeloma not having achieved remission [facility]) He has IgA kappa multiple myeloma with multiple bone fractures at presentation in 2024.  Bone marrow biopsy showed 15% involvement, standard risk myeloma FISH and cytogenetics  He ha initially good partial response with combination chemotherapy with daratumumab , lenalidomide , bortezomib  and dexamethasone  but then started to have progression of disease Treatment was switched to combination treatment with carfilzomib , cyclophosphamide  and dexamethasone  Recent myeloma panel was shared with the patient which show positive response to therapy  He will continue vitamin D  supplement He is on acyclovir  for antimicrobial prophylaxis He had received Zometa  on April 1, next dose will be due in July I recommend he continue on high-dose dexamethasone  daily at 2 mg daily except for Friday and Tuesday next week.  I plan to reduce IV dexamethasone  next week to 10 mg I will check out referral made to [facility] last month Deficiency anemia Multifactorial This is stable Monitor closely He will receive a unit of blood if hemoglobin is less than 8 He does not need blood transfusion today Cancer related pain He has new onset of back pain, pain on the left wrist and other areas in multiple sites I recommend PET/CT imaging to stage his disease and rule out fractures Hypervolemia, unspecified hypervolemia type He is euvolemic on exam He will continue furosemide  40 mg in the morning and 20 mg in the afternoon If his weight dropped to less than 210 pounds, he will omit afternoon dose  Orders Placed This Encounter  Procedures   NM PET Image Restage (PS) Whole Body (F-18 FDG)    Standing Status:   Future    Expected Date:   09/29/2023    Expiration Date:    09/21/2024    If indicated for the ordered procedure, I authorize the administration of a radiopharmaceutical per Radiology protocol:   Yes    Preferred imaging location?:   [facility]yce, MD  INTERVAL HISTORY: he returns for treatment follow-up Complications related to previous cycle of chemotherapy included anemia,, cancer associated pain,, and leg edema He has not onset of back pain.  His rib pain has resolved He is sleeping better He has significant bilateral lower extremity edema despite high-dose furosemide  His energy level is fair No recent bleeding We reviewed test results and discussed plan of care  PHYSICAL EXAMINATION: ECOG PERFORMANCE STATUS: 1 - Symptomatic but completely ambulatory  Vitals:   09/22/23 1018  BP: (!) 149/55  Pulse: 66  Resp: 18  Temp: 99 F (37.2 C)  SpO2: 97%   Filed Weights   09/22/23 1018  Weight: 215 lb (97.5 kg)    Relevant data reviewed during this visit included CBC and CMP as well as myeloma panel from April

## 2023-09-22 NOTE — Assessment & Plan Note (Addendum)
 He is euvolemic on exam He will continue furosemide 40 mg in the morning and 20 mg in the afternoon If his weight dropped to less than 210 pounds, he will omit afternoon dose

## 2023-09-22 NOTE — Patient Instructions (Signed)

## 2023-09-23 ENCOUNTER — Other Ambulatory Visit: Payer: Self-pay

## 2023-09-23 ENCOUNTER — Telehealth: Payer: Self-pay

## 2023-09-23 NOTE — Telephone Encounter (Signed)
 Called back and spoke with Daniel Whitaker given new appt on 5/6 at 0930, arrive at 9 am to [facility] with Dr. Bernetta Brilliant. She is aware of appt.

## 2023-09-23 NOTE — Telephone Encounter (Addendum)
 Called and spoke with Daniel Whitaker to give appt. He is ask that I call [name] and give her appt.  Left a message for [name] to call the office. Appt has been changed at [facility] to 5/6 at 9:30 am, arrive by 9 am for appt with Dr. Bernetta Brilliant. Appt changed due to chemo days.

## 2023-09-23 NOTE — Telephone Encounter (Signed)
 Called to check on referral at [facility] with Dr. Gaila Josephs. Scheduled appt with Dr. Gaila Josephs on 5/1 at 1200, given address and phone # to granddaughter who answered the phone. She verbalized understanding to appt.  Re faxed referral to [facility] to [phone], recieved a fax confirmation.

## 2023-09-24 ENCOUNTER — Other Ambulatory Visit

## 2023-09-24 ENCOUNTER — Ambulatory Visit: Admitting: Hematology and Oncology

## 2023-09-24 ENCOUNTER — Encounter

## 2023-09-24 ENCOUNTER — Ambulatory Visit (HOSPITAL_COMMUNITY)
Admission: RE | Admit: 2023-09-24 | Discharge: 2023-09-24 | Disposition: A | Discharge status: Home / Self Care | Source: Ambulatory Visit | Attending: Physician Assistant | Admitting: Physician Assistant

## 2023-09-24 ENCOUNTER — Ambulatory Visit

## 2023-09-24 DIAGNOSIS — H9012 Conductive hearing loss, unilateral, left ear, with unrestricted hearing on the contralateral side: Secondary | ICD-10-CM | POA: Insufficient documentation

## 2023-09-24 MED ORDER — IOHEXOL 350 MG/ML SOLN
75.0000 mL | Freq: Once | INTRAVENOUS | Status: AC | PRN
  Administered 2023-09-24: 75 mL via INTRAVENOUS

## 2023-09-28 ENCOUNTER — Telehealth (INDEPENDENT_AMBULATORY_CARE_PROVIDER_SITE_OTHER): Payer: Self-pay

## 2023-09-28 NOTE — Telephone Encounter (Signed)
 I see that the exam has been completed by we are waiting for the radiologist to interpret it

## 2023-09-28 NOTE — Telephone Encounter (Signed)
 Spoke to patient's grand daughter and informed her that the radiologist has not interpreted the report and will receive a call once it is read.

## 2023-09-29 ENCOUNTER — Encounter (HOSPITAL_COMMUNITY)

## 2023-09-30 ENCOUNTER — Inpatient Hospital Stay

## 2023-09-30 ENCOUNTER — Inpatient Hospital Stay: Attending: Hematology and Oncology | Admitting: Hematology and Oncology

## 2023-09-30 ENCOUNTER — Encounter: Payer: Self-pay | Admitting: Hematology and Oncology

## 2023-09-30 VITALS — BP 142/56 | HR 84 | Resp 18 | Ht 72.0 in | Wt 210.0 lb

## 2023-09-30 VITALS — BP 120/60 | HR 70 | Temp 98.4°F | Resp 16

## 2023-09-30 DIAGNOSIS — L89321 Pressure ulcer of left buttock, stage 1: Secondary | ICD-10-CM | POA: Diagnosis not present

## 2023-09-30 DIAGNOSIS — G893 Neoplasm related pain (acute) (chronic): Secondary | ICD-10-CM

## 2023-09-30 DIAGNOSIS — R634 Abnormal weight loss: Secondary | ICD-10-CM | POA: Diagnosis not present

## 2023-09-30 DIAGNOSIS — I480 Paroxysmal atrial fibrillation: Secondary | ICD-10-CM | POA: Diagnosis not present

## 2023-09-30 DIAGNOSIS — D649 Anemia, unspecified: Secondary | ICD-10-CM | POA: Insufficient documentation

## 2023-09-30 DIAGNOSIS — C9000 Multiple myeloma not having achieved remission: Secondary | ICD-10-CM | POA: Diagnosis not present

## 2023-09-30 DIAGNOSIS — D638 Anemia in other chronic diseases classified elsewhere: Secondary | ICD-10-CM | POA: Diagnosis not present

## 2023-09-30 DIAGNOSIS — Z79899 Other long term (current) drug therapy: Secondary | ICD-10-CM | POA: Insufficient documentation

## 2023-09-30 DIAGNOSIS — Z5112 Encounter for antineoplastic immunotherapy: Secondary | ICD-10-CM | POA: Diagnosis not present

## 2023-09-30 LAB — CMP (CANCER CENTER ONLY)
ALT: 7 U/L (ref 0–44)
AST: 7 U/L — ABNORMAL LOW (ref 15–41)
Albumin: 3.6 g/dL (ref 3.5–5.0)
Alkaline Phosphatase: 291 U/L — ABNORMAL HIGH (ref 38–126)
Anion gap: 5 (ref 5–15)
BUN: 22 mg/dL (ref 8–23)
CO2: 28 mmol/L (ref 22–32)
Calcium: 8.6 mg/dL — ABNORMAL LOW (ref 8.9–10.3)
Chloride: 109 mmol/L (ref 98–111)
Creatinine: 0.73 mg/dL (ref 0.61–1.24)
GFR, Estimated: >60 mL/min (ref >60)
Glucose, Bld: 98 mg/dL (ref 70–99)
Potassium: 3.5 mmol/L (ref 3.5–5.1)
Sodium: 142 mmol/L (ref 135–145)
Total Bilirubin: 0.3 mg/dL (ref 0.0–1.2)
Total Protein: 6.1 g/dL — ABNORMAL LOW (ref 6.5–8.1)

## 2023-09-30 LAB — CBC WITH DIFFERENTIAL (CANCER CENTER ONLY)
Abs Immature Granulocytes: 0.01 10*3/uL (ref 0.00–0.07)
Basophils Absolute: 0.1 10*3/uL (ref 0.0–0.1)
Basophils Relative: 1 %
Eosinophils Absolute: 0.2 10*3/uL (ref 0.0–0.5)
Eosinophils Relative: 2 %
HCT: 30.9 % — ABNORMAL LOW (ref 39.0–52.0)
Hemoglobin: 9.8 g/dL — ABNORMAL LOW (ref 13.0–17.0)
Immature Granulocytes: 0 %
Lymphocytes Relative: 15 %
Lymphs Abs: 1.1 10*3/uL (ref 0.7–4.0)
MCH: 27.4 pg (ref 26.0–34.0)
MCHC: 31.7 g/dL (ref 30.0–36.0)
MCV: 86.3 fL (ref 80.0–100.0)
Monocytes Absolute: 0.6 10*3/uL (ref 0.1–1.0)
Monocytes Relative: 9 %
Neutro Abs: 5.2 10*3/uL (ref 1.7–7.7)
Neutrophils Relative %: 73 %
Platelet Count: 226 10*3/uL (ref 150–400)
RBC: 3.58 MIL/uL — ABNORMAL LOW (ref 4.22–5.81)
RDW: 18.3 % — ABNORMAL HIGH (ref 11.5–15.5)
WBC Count: 7.1 10*3/uL (ref 4.0–10.5)
nRBC: 0 % (ref 0.0–0.2)

## 2023-09-30 MED ORDER — DEXTROSE 5 % IV SOLN
36.0000 mg/m2 | Freq: Once | INTRAVENOUS | Status: AC
  Administered 2023-09-30: 80 mg via INTRAVENOUS
  Filled 2023-09-30: qty 30

## 2023-09-30 MED ORDER — HEPARIN SOD (PORK) LOCK FLUSH 100 UNIT/ML IV SOLN
500.0000 [IU] | Freq: Once | INTRAVENOUS | Status: AC | PRN
  Administered 2023-09-30: 500 [IU]

## 2023-09-30 MED ORDER — DEXAMETHASONE SODIUM PHOSPHATE 10 MG/ML IJ SOLN
10.0000 mg | Freq: Once | INTRAMUSCULAR | Status: AC
Start: 2023-09-30 — End: 2023-09-30
  Administered 2023-09-30: 10 mg via INTRAVENOUS
  Filled 2023-09-30: qty 1

## 2023-09-30 MED ORDER — SODIUM CHLORIDE 0.9 % IV SOLN
INTRAVENOUS | Status: DC
Start: 2023-09-30 — End: 2023-09-30

## 2023-09-30 MED ORDER — ALPRAZOLAM 0.25 MG PO TABS: 0.2500 mg | ORAL_TABLET | Freq: Two times a day (BID) | ORAL | 0 refills | Status: DC | PRN

## 2023-09-30 MED ORDER — SODIUM CHLORIDE 0.9% FLUSH
10.0000 mL | Freq: Once | INTRAVENOUS | Status: AC
  Administered 2023-09-30: 10 mL

## 2023-09-30 MED ORDER — SODIUM CHLORIDE 0.9 % IV SOLN: Freq: Once | INTRAVENOUS | Status: AC

## 2023-09-30 MED ORDER — SODIUM CHLORIDE 0.9 % IV SOLN
500.0000 mg | Freq: Once | INTRAVENOUS | Status: AC
  Administered 2023-09-30: 500 mg via INTRAVENOUS
  Filled 2023-09-30: qty 25

## 2023-09-30 MED ORDER — FUROSEMIDE 40 MG PO TABS: 40.0000 mg | ORAL_TABLET | Freq: Every day | ORAL | Status: DC

## 2023-09-30 MED ORDER — DEXAMETHASONE 4 MG PO TABS: ORAL_TABLET | ORAL | Status: DC

## 2023-09-30 MED ORDER — SODIUM CHLORIDE 0.9% FLUSH
10.0000 mL | INTRAVENOUS | Status: DC | PRN
  Administered 2023-09-30: 10 mL

## 2023-09-30 NOTE — Assessment & Plan Note (Addendum)
 He has significant history of fluid overload a month ago His weight is starting to trend down We will continue furosemide  taper He will only take 40 mg daily I plan to omit furosemide  in the future if his weight dropped to less than 200 pounds He has no clinical signs of fluid overload

## 2023-09-30 NOTE — Progress Notes (Signed)
 [facility] OFFICE PROGRESS NOTE  Patient Care Team: Glena Landau, MD as PCP - General (Family Medicine) Eilleen Grates, MD as PCP - Cardiology (Cardiology)  Assessment & Plan Multiple myeloma not having achieved remission [facility]) He has IgA kappa multiple myeloma with multiple bone fractures at presentation in 2024.  Bone marrow biopsy showed 15% involvement, standard risk myeloma FISH and cytogenetics  He ha initially good partial response with combination chemotherapy with daratumumab , lenalidomide , bortezomib  and dexamethasone  but then started to have progression of disease Treatment was switched to combination treatment with carfilzomib , cyclophosphamide  and dexamethasone  Recent myeloma panel was shared with the patient which show positive response to therapy  He will continue vitamin D  supplement He is on acyclovir  for antimicrobial prophylaxis He had received Zometa  on April 1, next dose will be due in July Repeat myeloma panel from today is pending He has an appointment to see physician at [facility] for second opinion/consideration for future BiTE therapy should he fail current regimen I recommend we continue on dexamethasone  taper After treatment next week, I recommend he omit daily dexamethasone  and only takes 2 mg on Sunday and Tuesday Cancer related pain He has new onset of back pain, pain on the left wrist and other areas in multiple sites PET/CT imaging is rescheduled to tomorrow The patient has diffuse pain likely due to recent steroid taper He is also taking less pain medicine right now He will continue oxycodone  as needed Paroxysmal atrial fibrillation (HCC) He had recent paroxysmal atrial fibrillation He will continue prescription apixaban  Weight loss He has significant history of fluid overload a month ago His weight is starting to trend down We will continue furosemide  taper He will only take 40 mg daily I plan to omit furosemide  in the future  if his weight dropped to less than 200 pounds He has no clinical signs of fluid overload Anemia of chronic disease This is likely anemia of chronic disease and from recent renal failure and chemotherapy. The patient denies recent history of bleeding such as epistaxis, hematuria or hematochezia. He is asymptomatic from the anemia. We will observe for now.   No orders of the defined types were placed in this encounter.    [name], MD  INTERVAL HISTORY: he returns for treatment follow-up Complications related to previous cycle of chemotherapy included anemia,, weight loss,, cancer associated pain,, and bone aches, Since last time I saw him, he is sleeping well He has not needed to take morphine  sulfate at night He complained of diffuse musculoskeletal pain but he is only taking 3 doses of oxycodone  in general No recent bleeding Decubitus ulcer has healed Is concerned about recent weight loss He denies peripheral leg edema We reviewed test results and discussed future plan of care as well as medication changes today  PHYSICAL EXAMINATION: ECOG PERFORMANCE STATUS: 1 - Symptomatic but completely ambulatory  Vitals:   09/30/23 0844  BP: (!) 142/56  Pulse: 84  Resp: 18  SpO2: 95%   Filed Weights   09/30/23 0844  Weight: 210 lb (95.3 kg)    Relevant data reviewed during this visit included CBC and CMP

## 2023-09-30 NOTE — Assessment & Plan Note (Addendum)
 He has new onset of back pain, pain on the left wrist and other areas in multiple sites PET/CT imaging is rescheduled to tomorrow The patient has diffuse pain likely due to recent steroid taper He is also taking less pain medicine right now He will continue oxycodone  as needed

## 2023-09-30 NOTE — Assessment & Plan Note (Addendum)
 This is likely anemia of chronic disease and from recent renal failure and chemotherapy. The patient denies recent history of bleeding such as epistaxis, hematuria or hematochezia. He is asymptomatic from the anemia. We will observe for now.

## 2023-09-30 NOTE — Patient Instructions (Signed)
 [facility] - A DEPT OF [facility]  Discharge Instructions: Thank you for choosing [facility] to provide your oncology and hematology care.   If you have a lab appointment with the Cancer Center, please go directly to the Cancer Center and check in at the registration area.   Wear comfortable clothing and clothing appropriate for easy access to any Portacath or PICC line.   We strive to give you quality time with your provider. You may need to reschedule your appointment if you arrive late (15 or more minutes).  Arriving late affects you and other patients whose appointments are after yours.  Also, if you miss three or more appointments without notifying the office, you may be dismissed from the clinic at the provider's discretion.      For prescription refill requests, have your pharmacy contact our office and allow 72 hours for refills to be completed.    Today you received the following chemotherapy and/or immunotherapy agents: Cytoxan  & Kyprolis       To help prevent nausea and vomiting after your treatment, we encourage you to take your nausea medication as directed.  BELOW ARE SYMPTOMS THAT SHOULD BE REPORTED IMMEDIATELY: *FEVER GREATER THAN 100.4 F (38 C) OR HIGHER *CHILLS OR SWEATING *NAUSEA AND VOMITING THAT IS NOT CONTROLLED WITH YOUR NAUSEA MEDICATION *UNUSUAL SHORTNESS OF BREATH *UNUSUAL BRUISING OR BLEEDING *URINARY PROBLEMS (pain or burning when urinating, or frequent urination) *BOWEL PROBLEMS (unusual diarrhea, constipation, pain near the anus) TENDERNESS IN MOUTH AND THROAT WITH OR WITHOUT PRESENCE OF ULCERS (sore throat, sores in mouth, or a toothache) UNUSUAL RASH, SWELLING OR PAIN  UNUSUAL VAGINAL DISCHARGE OR ITCHING   Items with * indicate a potential emergency and should be followed up as soon as possible or go to the Emergency Department if any problems should occur.  Please show the CHEMOTHERAPY ALERT CARD or  IMMUNOTHERAPY ALERT CARD at check-in to the Emergency Department and triage nurse.  Should you have questions after your visit or need to cancel or reschedule your appointment, please contact [facility]  Dept: [phone]  and follow the prompts.  Office hours are 8:00 a.m. to 4:30 p.m. Monday - Friday. Please note that voicemails left after 4:00 p.m. may not be returned until the following business day.  We are closed weekends and major holidays. You have access to a nurse at all times for urgent questions. Please call the main number to the clinic Dept: [phone] and follow the prompts.   For any non-urgent questions, you may also contact your provider using MyChart. We now offer e-Visits for anyone 10 and older to request care online for non-urgent symptoms. For details visit [email or web address].   Also download the MyChart app! Go to the app store, search "MyChart", open the app, select [facility], and log in with your MyChart username and password.

## 2023-09-30 NOTE — Assessment & Plan Note (Addendum)
 He had recent paroxysmal atrial fibrillation He will continue prescription apixaban 

## 2023-09-30 NOTE — Assessment & Plan Note (Addendum)
 He has IgA kappa multiple myeloma with multiple bone fractures at presentation in 2024.  Bone marrow biopsy showed 15% involvement, standard risk myeloma FISH and cytogenetics  He ha initially good partial response with combination chemotherapy with daratumumab , lenalidomide , bortezomib  and dexamethasone  but then started to have progression of disease Treatment was switched to combination treatment with carfilzomib , cyclophosphamide  and dexamethasone  Recent myeloma panel was shared with the patient which show positive response to therapy  He will continue vitamin D  supplement He is on acyclovir  for antimicrobial prophylaxis He had received Zometa  on April 1, next dose will be due in July Repeat myeloma panel from today is pending He has an appointment to see physician at [facility] for second opinion/consideration for future BiTE therapy should he fail current regimen I recommend we continue on dexamethasone  taper After treatment next week, I recommend he omit daily dexamethasone  and only takes 2 mg on Sunday and Tuesday

## 2023-10-01 ENCOUNTER — Encounter (HOSPITAL_COMMUNITY)
Admission: RE | Admit: 2023-10-01 | Discharge: 2023-10-01 | Disposition: A | Discharge status: Home / Self Care | Source: Ambulatory Visit | Attending: Hematology and Oncology | Admitting: Hematology and Oncology

## 2023-10-01 ENCOUNTER — Inpatient Hospital Stay

## 2023-10-01 VITALS — BP 110/50 | HR 65 | Temp 97.7°F | Resp 16

## 2023-10-01 DIAGNOSIS — G893 Neoplasm related pain (acute) (chronic): Secondary | ICD-10-CM | POA: Diagnosis not present

## 2023-10-01 DIAGNOSIS — L89321 Pressure ulcer of left buttock, stage 1: Secondary | ICD-10-CM | POA: Diagnosis not present

## 2023-10-01 DIAGNOSIS — Z5112 Encounter for antineoplastic immunotherapy: Secondary | ICD-10-CM | POA: Diagnosis not present

## 2023-10-01 DIAGNOSIS — C9000 Multiple myeloma not having achieved remission: Secondary | ICD-10-CM

## 2023-10-01 DIAGNOSIS — C7951 Secondary malignant neoplasm of bone: Secondary | ICD-10-CM | POA: Diagnosis not present

## 2023-10-01 DIAGNOSIS — D649 Anemia, unspecified: Secondary | ICD-10-CM | POA: Diagnosis not present

## 2023-10-01 DIAGNOSIS — Z79899 Other long term (current) drug therapy: Secondary | ICD-10-CM | POA: Diagnosis not present

## 2023-10-01 LAB — KAPPA/LAMBDA LIGHT CHAINS
Kappa free light chain: 78.8 mg/L — ABNORMAL HIGH (ref 3.3–19.4)
Kappa, lambda light chain ratio: 52.53 — ABNORMAL HIGH (ref 0.26–1.65)
Lambda free light chains: 1.5 mg/L — ABNORMAL LOW (ref 5.7–26.3)

## 2023-10-01 LAB — GLUCOSE, CAPILLARY: Glucose-Capillary: 92 mg/dL (ref 70–99)

## 2023-10-01 MED ORDER — PROCHLORPERAZINE MALEATE 10 MG PO TABS
10.0000 mg | ORAL_TABLET | Freq: Once | ORAL | Status: AC
  Administered 2023-10-01: 10 mg via ORAL
  Filled 2023-10-01: qty 1

## 2023-10-01 MED ORDER — SODIUM CHLORIDE 0.9 % IV SOLN: INTRAVENOUS | Status: DC

## 2023-10-01 MED ORDER — HEPARIN SOD (PORK) LOCK FLUSH 100 UNIT/ML IV SOLN
500.0000 [IU] | Freq: Once | INTRAVENOUS | Status: AC | PRN
  Administered 2023-10-01: 500 [IU]

## 2023-10-01 MED ORDER — SODIUM CHLORIDE 0.9 % IV SOLN
Freq: Once | INTRAVENOUS | Status: AC
Start: 2023-10-01 — End: 2023-10-01

## 2023-10-01 MED ORDER — FLUDEOXYGLUCOSE F - 18 (FDG) INJECTION
10.3400 | Freq: Once | INTRAVENOUS | Status: AC | PRN
Start: 2023-10-01 — End: 2023-10-01
  Administered 2023-10-01: 10.34 via INTRAVENOUS

## 2023-10-01 MED ORDER — SODIUM CHLORIDE 0.9% FLUSH
10.0000 mL | INTRAVENOUS | Status: DC | PRN
Start: 2023-10-01 — End: 2023-10-01
  Administered 2023-10-01: 10 mL

## 2023-10-01 MED ORDER — DEXTROSE 5 % IV SOLN
36.0000 mg/m2 | Freq: Once | INTRAVENOUS | Status: AC
  Administered 2023-10-01: 80 mg via INTRAVENOUS
  Filled 2023-10-01: qty 30

## 2023-10-01 NOTE — Patient Instructions (Signed)

## 2023-10-04 LAB — MULTIPLE MYELOMA PANEL, SERUM
Albumin SerPl Elph-Mcnc: 3.2 g/dL (ref 2.9–4.4)
Albumin/Glob SerPl: 1.4 (ref 0.7–1.7)
Alpha 1: 0.2 g/dL (ref 0.0–0.4)
Alpha2 Glob SerPl Elph-Mcnc: 0.7 g/dL (ref 0.4–1.0)
B-Globulin SerPl Elph-Mcnc: 0.8 g/dL (ref 0.7–1.3)
Gamma Glob SerPl Elph-Mcnc: 0.7 g/dL (ref 0.4–1.8)
Globulin, Total: 2.4 g/dL (ref 2.2–3.9)
IgA: 454 mg/dL — ABNORMAL HIGH (ref 61–437)
IgG (Immunoglobin G), Serum: 332 mg/dL — ABNORMAL LOW (ref 603–1613)
IgM (Immunoglobulin M), Srm: 12 mg/dL — ABNORMAL LOW (ref 15–143)
M Protein SerPl Elph-Mcnc: 0.5 g/dL — ABNORMAL HIGH
Total Protein ELP: 5.6 g/dL — ABNORMAL LOW (ref 6.0–8.5)

## 2023-10-05 ENCOUNTER — Other Ambulatory Visit: Payer: Self-pay | Admitting: Adult Health

## 2023-10-05 DIAGNOSIS — C9000 Multiple myeloma not having achieved remission: Secondary | ICD-10-CM | POA: Diagnosis not present

## 2023-10-05 MED ORDER — PREGABALIN 100 MG PO CAPS: ORAL_CAPSULE | ORAL | 1 refills | Status: DC

## 2023-10-05 NOTE — Telephone Encounter (Signed)
 Pt is requesting a refill for pregabalin  (LYRICA ) 100 MG capsule .  Pharmacy: [facility]

## 2023-10-05 NOTE — Telephone Encounter (Signed)
 Last visit: 03/11/23 Next visit: 11/10/23 Last fills:

## 2023-10-07 ENCOUNTER — Inpatient Hospital Stay

## 2023-10-07 ENCOUNTER — Telehealth (INDEPENDENT_AMBULATORY_CARE_PROVIDER_SITE_OTHER): Payer: Self-pay | Admitting: Physician Assistant

## 2023-10-07 ENCOUNTER — Encounter: Payer: Self-pay | Admitting: Hematology and Oncology

## 2023-10-07 ENCOUNTER — Inpatient Hospital Stay: Admitting: Hematology and Oncology

## 2023-10-07 ENCOUNTER — Telehealth: Payer: Self-pay | Admitting: Physician Assistant

## 2023-10-07 VITALS — BP 126/54 | HR 96 | Resp 18 | Ht 72.0 in | Wt 204.8 lb

## 2023-10-07 VITALS — Temp 97.9°F

## 2023-10-07 DIAGNOSIS — Z5112 Encounter for antineoplastic immunotherapy: Secondary | ICD-10-CM | POA: Diagnosis not present

## 2023-10-07 DIAGNOSIS — C9000 Multiple myeloma not having achieved remission: Secondary | ICD-10-CM

## 2023-10-07 DIAGNOSIS — E8770 Fluid overload, unspecified: Secondary | ICD-10-CM

## 2023-10-07 DIAGNOSIS — D638 Anemia in other chronic diseases classified elsewhere: Secondary | ICD-10-CM | POA: Diagnosis not present

## 2023-10-07 DIAGNOSIS — L89321 Pressure ulcer of left buttock, stage 1: Secondary | ICD-10-CM | POA: Diagnosis not present

## 2023-10-07 DIAGNOSIS — G893 Neoplasm related pain (acute) (chronic): Secondary | ICD-10-CM

## 2023-10-07 DIAGNOSIS — R634 Abnormal weight loss: Secondary | ICD-10-CM | POA: Diagnosis not present

## 2023-10-07 DIAGNOSIS — Z79899 Other long term (current) drug therapy: Secondary | ICD-10-CM | POA: Diagnosis not present

## 2023-10-07 DIAGNOSIS — D649 Anemia, unspecified: Secondary | ICD-10-CM | POA: Diagnosis not present

## 2023-10-07 LAB — CBC WITH DIFFERENTIAL (CANCER CENTER ONLY)
Abs Immature Granulocytes: 0.02 10*3/uL (ref 0.00–0.07)
Basophils Absolute: 0.1 10*3/uL (ref 0.0–0.1)
Basophils Relative: 1 %
Eosinophils Absolute: 0.2 10*3/uL (ref 0.0–0.5)
Eosinophils Relative: 3 %
HCT: 28.6 % — ABNORMAL LOW (ref 39.0–52.0)
Hemoglobin: 9.5 g/dL — ABNORMAL LOW (ref 13.0–17.0)
Immature Granulocytes: 0 %
Lymphocytes Relative: 11 %
Lymphs Abs: 0.6 10*3/uL — ABNORMAL LOW (ref 0.7–4.0)
MCH: 28 pg (ref 26.0–34.0)
MCHC: 33.2 g/dL (ref 30.0–36.0)
MCV: 84.4 fL (ref 80.0–100.0)
Monocytes Absolute: 0.5 10*3/uL (ref 0.1–1.0)
Monocytes Relative: 10 %
Neutro Abs: 3.9 10*3/uL (ref 1.7–7.7)
Neutrophils Relative %: 75 %
Platelet Count: 159 10*3/uL (ref 150–400)
RBC: 3.39 MIL/uL — ABNORMAL LOW (ref 4.22–5.81)
RDW: 17.7 % — ABNORMAL HIGH (ref 11.5–15.5)
WBC Count: 5.3 10*3/uL (ref 4.0–10.5)
nRBC: 0 % (ref 0.0–0.2)

## 2023-10-07 LAB — CMP (CANCER CENTER ONLY)
ALT: 8 U/L (ref 0–44)
AST: 9 U/L — ABNORMAL LOW (ref 15–41)
Albumin: 3.5 g/dL (ref 3.5–5.0)
Alkaline Phosphatase: 203 U/L — ABNORMAL HIGH (ref 38–126)
Anion gap: 6 (ref 5–15)
BUN: 20 mg/dL (ref 8–23)
CO2: 26 mmol/L (ref 22–32)
Calcium: 8.3 mg/dL — ABNORMAL LOW (ref 8.9–10.3)
Chloride: 108 mmol/L (ref 98–111)
Creatinine: 0.75 mg/dL (ref 0.61–1.24)
GFR, Estimated: >60 mL/min (ref >60)
Glucose, Bld: 114 mg/dL — ABNORMAL HIGH (ref 70–99)
Potassium: 3.3 mmol/L — ABNORMAL LOW (ref 3.5–5.1)
Sodium: 140 mmol/L (ref 135–145)
Total Bilirubin: 0.4 mg/dL (ref 0.0–1.2)
Total Protein: 5.9 g/dL — ABNORMAL LOW (ref 6.5–8.1)

## 2023-10-07 MED ORDER — SODIUM CHLORIDE 0.9% FLUSH
10.0000 mL | Freq: Once | INTRAVENOUS | Status: AC
  Administered 2023-10-07: 10 mL

## 2023-10-07 MED ORDER — SODIUM CHLORIDE 0.9 % IV SOLN: INTRAVENOUS | Status: DC

## 2023-10-07 MED ORDER — CARFILZOMIB CHEMO INJECTION 60 MG
36.0000 mg/m2 | Freq: Once | INTRAVENOUS | Status: AC
  Administered 2023-10-07: 80 mg via INTRAVENOUS
  Filled 2023-10-07: qty 30

## 2023-10-07 MED ORDER — SODIUM CHLORIDE 0.9 % IV SOLN
500.0000 mg | Freq: Once | INTRAVENOUS | Status: AC
  Administered 2023-10-07: 500 mg via INTRAVENOUS
  Filled 2023-10-07: qty 25

## 2023-10-07 MED ORDER — HEPARIN SOD (PORK) LOCK FLUSH 100 UNIT/ML IV SOLN
500.0000 [IU] | Freq: Once | INTRAVENOUS | Status: AC | PRN
  Administered 2023-10-07: 500 [IU]

## 2023-10-07 MED ORDER — SODIUM CHLORIDE 0.9 % IV SOLN: Freq: Once | INTRAVENOUS | Status: AC

## 2023-10-07 MED ORDER — DEXAMETHASONE SODIUM PHOSPHATE 10 MG/ML IJ SOLN
10.0000 mg | Freq: Once | INTRAMUSCULAR | Status: AC
  Administered 2023-10-07: 10 mg via INTRAVENOUS
  Filled 2023-10-07: qty 1

## 2023-10-07 MED ORDER — SODIUM CHLORIDE 0.9% FLUSH
10.0000 mL | INTRAVENOUS | Status: DC | PRN
  Administered 2023-10-07: 10 mL

## 2023-10-07 NOTE — Assessment & Plan Note (Addendum)
 He has IgA kappa multiple myeloma with multiple bone fractures at presentation in 2024.  Bone marrow biopsy showed 15% involvement, standard risk myeloma FISH and cytogenetics  He ha initially good partial response with combination chemotherapy with daratumumab , lenalidomide , bortezomib  and dexamethasone  but then started to have progression of disease Treatment was switched to combination treatment with carfilzomib , cyclophosphamide  and dexamethasone  Recent myeloma panel was shared with the patient which show positive response to therapy  He will continue vitamin D  supplement  Due to findings of hypocalcemia, he is recommended to resume calcium  supplement He is on acyclovir  for antimicrobial prophylaxis He had received Zometa  on April 1, next dose will be due in July He had recent appointment at [facility], details of the visit was not available but according to family members, the plan will be to continue treatment as scheduled I recommend we continue on dexamethasone  taper; I recommend omitting daily dexamethasone .  He will only take dexamethasone  when he returns here for treatment I reviewed PET/CT imaging with the patient and family which show overall improved disease control.  There are multiple fractures but there are nothing that needs intervention such as kyphoplasty

## 2023-10-07 NOTE — Progress Notes (Signed)
 [facility] OFFICE PROGRESS NOTE  Patient Care Team: Glena Landau, MD as PCP - General (Family Medicine) Eilleen Grates, MD as PCP - Cardiology (Cardiology)  Assessment & Plan Multiple myeloma not having achieved remission [facility]) He has IgA kappa multiple myeloma with multiple bone fractures at presentation in 2024.  Bone marrow biopsy showed 15% involvement, standard risk myeloma FISH and cytogenetics  He ha initially good partial response with combination chemotherapy with daratumumab , lenalidomide , bortezomib  and dexamethasone  but then started to have progression of disease Treatment was switched to combination treatment with carfilzomib , cyclophosphamide  and dexamethasone  Recent myeloma panel was shared with the patient which show positive response to therapy  He will continue vitamin D  supplement  Due to findings of hypocalcemia, he is recommended to resume calcium  supplement He is on acyclovir  for antimicrobial prophylaxis He had received Zometa  on April 1, next dose will be due in July He had recent appointment at [facility], details of the visit was not available but according to family members, the plan will be to continue treatment as scheduled I recommend we continue on dexamethasone  taper; I recommend omitting daily dexamethasone .  He will only take dexamethasone  when he returns here for treatment I reviewed PET/CT imaging with the patient and family which show overall improved disease control.  There are multiple fractures but there are nothing that needs intervention such as kyphoplasty Weight loss This is multifactorial His weight loss could be due to recent steroid withdrawal and diuretic therapy Monitor closely Anemia of chronic disease This is likely anemia of chronic disease and from recent renal failure and chemotherapy. The patient denies recent history of bleeding such as epistaxis, hematuria or hematochezia. He is asymptomatic from the anemia. We  will observe for now.  Cancer related pain He has good pain control He will continue oxycodone  taper Hypocalcemia Recommend resumption of calcium  supplement Hypervolemia, unspecified hypervolemia type He is euvolemic on exam I recommend omission of home furosemide  dose if his weight drops to less than 200 pounds  No orders of the defined types were placed in this encounter.    [name], MD  INTERVAL HISTORY: he returns for treatment follow-up Complications related to previous cycle of chemotherapy included anemia,, weight loss,, cancer associated pain,, and leg edema He is here accompanied by family He is doing well overall He is only taking oxycodone  once a day His weight is coming down He claims he is eating better We reviewed multiple test results and discussed medication taper and future follow-up  PHYSICAL EXAMINATION: ECOG PERFORMANCE STATUS: 1 - Symptomatic but completely ambulatory  Vitals:   10/07/23 0921  BP: (!) 126/54  Pulse: 96  Resp: 18  SpO2: 91%   Filed Weights   10/07/23 0921  Weight: 204 lb 12.8 oz (92.9 kg)    Relevant data reviewed during this visit included CBC, CMP, recent myeloma panel and PET/CT imaging from May 2025

## 2023-10-07 NOTE — Telephone Encounter (Signed)
 Called and left vm to move patient onto Dr.Patel's schedule for 5/12 at 9am per [facility].

## 2023-10-07 NOTE — Assessment & Plan Note (Addendum)
 Recommend resumption of calcium  supplement

## 2023-10-07 NOTE — Patient Instructions (Signed)
 [facility] - A DEPT OF [facility]  Discharge Instructions: Thank you for choosing [facility] to provide your oncology and hematology care.   If you have a lab appointment with the Cancer Center, please go directly to the Cancer Center and check in at the registration area.   Wear comfortable clothing and clothing appropriate for easy access to any Portacath or PICC line.   We strive to give you quality time with your provider. You may need to reschedule your appointment if you arrive late (15 or more minutes).  Arriving late affects you and other patients whose appointments are after yours.  Also, if you miss three or more appointments without notifying the office, you may be dismissed from the clinic at the provider's discretion.      For prescription refill requests, have your pharmacy contact our office and allow 72 hours for refills to be completed.    Today you received the following chemotherapy and/or immunotherapy agents: Kyprolis  & Cytoxan       To help prevent nausea and vomiting after your treatment, we encourage you to take your nausea medication as directed.  BELOW ARE SYMPTOMS THAT SHOULD BE REPORTED IMMEDIATELY: *FEVER GREATER THAN 100.4 F (38 C) OR HIGHER *CHILLS OR SWEATING *NAUSEA AND VOMITING THAT IS NOT CONTROLLED WITH YOUR NAUSEA MEDICATION *UNUSUAL SHORTNESS OF BREATH *UNUSUAL BRUISING OR BLEEDING *URINARY PROBLEMS (pain or burning when urinating, or frequent urination) *BOWEL PROBLEMS (unusual diarrhea, constipation, pain near the anus) TENDERNESS IN MOUTH AND THROAT WITH OR WITHOUT PRESENCE OF ULCERS (sore throat, sores in mouth, or a toothache) UNUSUAL RASH, SWELLING OR PAIN  UNUSUAL VAGINAL DISCHARGE OR ITCHING   Items with * indicate a potential emergency and should be followed up as soon as possible or go to the Emergency Department if any problems should occur.  Please show the CHEMOTHERAPY ALERT CARD or  IMMUNOTHERAPY ALERT CARD at check-in to the Emergency Department and triage nurse.  Should you have questions after your visit or need to cancel or reschedule your appointment, please contact [facility] - A DEPT OF [facility]  Dept: [phone]  and follow the prompts.  Office hours are 8:00 a.m. to 4:30 p.m. Monday - Friday. Please note that voicemails left after 4:00 p.m. may not be returned until the following business day.  We are closed weekends and major holidays. You have access to a nurse at all times for urgent questions. Please call the main number to the clinic Dept: [phone] and follow the prompts.   For any non-urgent questions, you may also contact your provider using MyChart. We now offer e-Visits for anyone 9 and older to request care online for non-urgent symptoms. For details visit [email or web address].   Also download the MyChart app! Go to the app store, search "MyChart", open the app, select [facility], and log in with your MyChart username and password.

## 2023-10-07 NOTE — Telephone Encounter (Signed)
 CALLED PT LEFT VM TO CONFIRM ADDRESS MJM

## 2023-10-07 NOTE — Assessment & Plan Note (Addendum)
 This is multifactorial His weight loss could be due to recent steroid withdrawal and diuretic therapy Monitor closely

## 2023-10-07 NOTE — Telephone Encounter (Signed)
 CT results were reviewed with Dr. Lydia Sams, I discussed these with her granddaughter who handles his medical care. Dr Lydia Sams would like to see him in the office for evaluation early next week.

## 2023-10-07 NOTE — Assessment & Plan Note (Addendum)
 He has good pain control He will continue oxycodone  taper

## 2023-10-07 NOTE — Assessment & Plan Note (Addendum)
 This is likely anemia of chronic disease and from recent renal failure and chemotherapy. The patient denies recent history of bleeding such as epistaxis, hematuria or hematochezia. He is asymptomatic from the anemia. We will observe for now.

## 2023-10-07 NOTE — Assessment & Plan Note (Addendum)
 He is euvolemic on exam I recommend omission of home furosemide  dose if his weight drops to less than 200 pounds

## 2023-10-08 ENCOUNTER — Inpatient Hospital Stay

## 2023-10-08 VITALS — BP 126/57 | HR 73 | Temp 97.8°F | Resp 16

## 2023-10-08 DIAGNOSIS — C9000 Multiple myeloma not having achieved remission: Secondary | ICD-10-CM

## 2023-10-08 DIAGNOSIS — Z79899 Other long term (current) drug therapy: Secondary | ICD-10-CM | POA: Diagnosis not present

## 2023-10-08 DIAGNOSIS — Z5112 Encounter for antineoplastic immunotherapy: Secondary | ICD-10-CM | POA: Diagnosis not present

## 2023-10-08 DIAGNOSIS — L89321 Pressure ulcer of left buttock, stage 1: Secondary | ICD-10-CM | POA: Diagnosis not present

## 2023-10-08 DIAGNOSIS — G893 Neoplasm related pain (acute) (chronic): Secondary | ICD-10-CM | POA: Diagnosis not present

## 2023-10-08 DIAGNOSIS — D649 Anemia, unspecified: Secondary | ICD-10-CM | POA: Diagnosis not present

## 2023-10-08 MED ORDER — PROCHLORPERAZINE MALEATE 10 MG PO TABS
10.0000 mg | ORAL_TABLET | Freq: Once | ORAL | Status: AC
  Administered 2023-10-08: 10 mg via ORAL
  Filled 2023-10-08: qty 1

## 2023-10-08 MED ORDER — SODIUM CHLORIDE 0.9 % IV SOLN: Freq: Once | INTRAVENOUS | Status: AC

## 2023-10-08 MED ORDER — SODIUM CHLORIDE 0.9% FLUSH
10.0000 mL | INTRAVENOUS | Status: DC | PRN
  Administered 2023-10-08: 10 mL

## 2023-10-08 MED ORDER — SODIUM CHLORIDE 0.9 % IV SOLN: INTRAVENOUS | Status: DC

## 2023-10-08 MED ORDER — HEPARIN SOD (PORK) LOCK FLUSH 100 UNIT/ML IV SOLN
500.0000 [IU] | Freq: Once | INTRAVENOUS | Status: AC | PRN
  Administered 2023-10-08: 500 [IU]

## 2023-10-08 MED ORDER — DEXTROSE 5 % IV SOLN
36.0000 mg/m2 | Freq: Once | INTRAVENOUS | Status: AC
  Administered 2023-10-08: 80 mg via INTRAVENOUS
  Filled 2023-10-08: qty 30

## 2023-10-08 NOTE — Patient Instructions (Signed)

## 2023-10-10 ENCOUNTER — Encounter: Payer: Self-pay | Admitting: Hematology and Oncology

## 2023-10-11 ENCOUNTER — Ambulatory Visit (INDEPENDENT_AMBULATORY_CARE_PROVIDER_SITE_OTHER): Admitting: Otolaryngology

## 2023-10-11 ENCOUNTER — Ambulatory Visit (INDEPENDENT_AMBULATORY_CARE_PROVIDER_SITE_OTHER): Admitting: Physician Assistant

## 2023-10-11 ENCOUNTER — Encounter (INDEPENDENT_AMBULATORY_CARE_PROVIDER_SITE_OTHER): Payer: Self-pay | Admitting: Otolaryngology

## 2023-10-11 VITALS — BP 147/71 | HR 91 | Ht 72.0 in | Wt 198.0 lb

## 2023-10-11 DIAGNOSIS — J3089 Other allergic rhinitis: Secondary | ICD-10-CM | POA: Diagnosis not present

## 2023-10-11 DIAGNOSIS — R0981 Nasal congestion: Secondary | ICD-10-CM | POA: Diagnosis not present

## 2023-10-11 DIAGNOSIS — H6992 Unspecified Eustachian tube disorder, left ear: Secondary | ICD-10-CM

## 2023-10-11 DIAGNOSIS — H6522 Chronic serous otitis media, left ear: Secondary | ICD-10-CM | POA: Diagnosis not present

## 2023-10-11 DIAGNOSIS — H90A11 Conductive hearing loss, unilateral, right ear with restricted hearing on the contralateral side: Secondary | ICD-10-CM | POA: Diagnosis not present

## 2023-10-11 DIAGNOSIS — H903 Sensorineural hearing loss, bilateral: Secondary | ICD-10-CM

## 2023-10-11 MED ORDER — OFLOXACIN 0.3 % OT SOLN: 4.0000 [drp] | Freq: Two times a day (BID) | OTIC | 1 refills | Status: DC

## 2023-10-11 NOTE — Patient Instructions (Signed)
 Use ofloxacin drop 4 drops each ear twice daily for 7 days

## 2023-10-11 NOTE — Progress Notes (Signed)
 Dear Dr. Bernetta Brilliant, Here is my assessment for our mutual patient, Daniel Whitaker. Thank you for allowing me the opportunity to care for your patient. Please do not hesitate to contact me should you have any other questions. Sincerely, Milon Aloe, MD  Otolaryngology Clinic Note Referring provider: Dr. Bernetta Brilliant HPI:  Daniel Whitaker is a 78 y.o. adult kindly referred by Dr. Bernetta Brilliant  for left sided conductive hearing loss.   Initially seen by [name] (09/21/2023): left sided ear pressure noted after 4 months after Dx with PNA and had some URI/nasal congestion. Using zyrtec, nasaacort; no drainage, dizziness, h/o Multiple myeloma. Noted left mastoid effusion on 08/29/2023 and lytic lesions. Does have long-standing h/o HL and using aids 6-7 years.  [name] obtained a CT Temporal bones and now seen for follow up.  --------------------------------------------------------- 10/11/2023 Seen in follow up. Continued left ear ear pressure, muffled hearing. No drainage, vertigo. Long-standing tinnitus and HL. We discussed his CT today. He is quite frustrated and this is affecting him fairly significantly given ongoing for several months. He has tried nasal sprays without much benefit.  Does have h/o noise exposure.   PMHx: OSA on CPAP, RF, MM on chemo, PAF on Eliquis , Neuropathy  Independent Review of Additional Tests or Records:  CT head without contrast on 08/29/2023 showing left mastoid effusion  Audiological evaluation on 09/15/2023 at [facility] independently interpreted: b/l downsloping HL with right SNHL and left mixed.  Left type B tymp, AD type A; WRT 68% AD, 56% AS at 75 and 85dB respectively.    CT Temporal Bones 09/24/2023 independently interpreted by me showing: left mastoid and ME effusion, ossicles generally unremarkable (query small amount of erosion), scutum sharp; some calavarial lytic lesions; no obvious tegmen dehiscence noted. No NP lesion noted on contrasted images PMH/Meds/All/SocHx/FamHx/ROS:    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   Nocturnal leg cramps 03/11/2021   Nontoxic uninodular goiter    Obesity    Peripheral neuropathy    RLS (restless legs syndrome)    Spinal stenosis    Unspecified deficiency anemia 07/11/2013     Past Surgical History:  Procedure Laterality Date   CARDIAC CATHETERIZATION     COLON RESECTION N/A 04/21/2013   Procedure: DIAGNOSTIC LAPAROSCOPY, lysis of adhesions for partial small bowel obstruction;  Surgeon: Harlee Lichtenstein, MD;  Location: [facility];  Service: General;  Laterality: N/A;   COLON SURGERY     partial SBO    COLONOSCOPY     CORONARY STENT INTERVENTION N/A 10/22/2021   Procedure: CORONARY STENT INTERVENTION;  Surgeon: Arleen Lacer, MD;  Location: [facility] INVASIVE CV LAB;  Service: Cardiovascular;  Laterality: N/A;   IR IMAGING GUIDED PORT INSERTION  09/02/2023   KNEE SURGERY     Arthroscopic   LEFT HEART CATH AND CORONARY ANGIOGRAPHY N/A 10/22/2021   Procedure: LEFT HEART CATH AND CORONARY ANGIOGRAPHY;  Surgeon: Arleen Lacer, MD;  Location: [facility] INVASIVE CV LAB;  Service: Cardiovascular;  Laterality: N/A;   SMALL INTESTINE SURGERY     Blockage    Family History  Problem Relation Age of Onset   Brain cancer Mother    Lung cancer  Father    Heart disease Brother        No details   Dementia Brother    Neuropathy Neg Hx    Colon cancer Neg Hx    Colon polyps Neg Hx    Esophageal cancer Neg Hx    Rectal cancer Neg Hx    Stomach cancer Neg Hx    Sleep apnea Neg Hx      Social Connections: Moderately Integrated (10/15/2023)   Social Connection and Isolation Panel [NHANES]    Frequency of Communication with Friends and Family:  Three times a week    Frequency of Social Gatherings with Friends and Family: Three times a week    Attends Religious Services: 1 to 4 times per year    Active Member of Clubs or Organizations: No    Attends Banker Meetings: Never    Marital Status: Married      Current Outpatient Medications:    acyclovir  (ZOVIRAX ) 400 MG tablet, Take 1 tablet (400 mg total) by mouth daily., Disp: 30 tablet, Rfl: 6   albuterol  (VENTOLIN  HFA) 108 (90 Base) MCG/ACT inhaler, Inhale 1-2 puffs into the lungs every 6 (six) hours as needed for wheezing or shortness of breath., Disp: , Rfl:    apixaban  (ELIQUIS ) 5 MG TABS tablet, Take 1 tablet (5 mg total) by mouth 2 (two) times daily., Disp: 60 tablet, Rfl: 2   brimonidine  (ALPHAGAN ) 0.2 % ophthalmic solution, Place 1 drop into the left eye in the morning and at bedtime., Disp: , Rfl:    cetirizine (ZYRTEC) 10 MG tablet, Take 10 mg by mouth every morning., Disp: , Rfl:    DENTA 5000 PLUS 1.1 % CREA dental cream, Place 1 Application onto teeth at bedtime., Disp: , Rfl:    docusate sodium  (COLACE) 100 MG capsule, Take 300 mg by mouth daily., Disp: , Rfl:    dorzolamide -timolol  (COSOPT ) 22.3-6.8 MG/ML ophthalmic solution, Place 1 drop into both eyes 2 (two) times daily., Disp: , Rfl:    furosemide  (LASIX ) 40 MG tablet, Take 1 tablet (40 mg total) by mouth daily. (Patient taking differently: Take 40 mg by mouth daily as needed for edema or fluid (weight over 200 lbs).), Disp: , Rfl:    ketoconazole (NIZORAL) 2 % cream, Apply 1 application  topically daily as needed for irritation., Disp: , Rfl:    latanoprost  (XALATAN ) 0.005 % ophthalmic solution, Place 1 drop into both eyes at bedtime., Disp: , Rfl:    lidocaine -prilocaine  (EMLA ) cream, Apply to affected area once (Patient taking differently: Apply 1 Application topically 2 (two) times daily as needed (Pain). Port for treatment), Disp: 30 g, Rfl: 3   morphine  (MS CONTIN ) 15 MG 12 hr tablet, Take 1 tablet  (15 mg total) by mouth at bedtime. (Patient taking differently: Take 15 mg by mouth at bedtime as needed for pain.), Disp: 30 tablet, Rfl: 0   Multiple Vitamins-Minerals (CENTRUM SILVER 50+MEN) TABS, Take 1 tablet by mouth daily with breakfast. (Patient not taking: Reported on 10/15/2023), Disp: , Rfl:    nitroGLYCERIN  (NITROSTAT ) 0.4 MG SL tablet, Place 1 tablet (0.4 mg total) under the tongue every 5 (five) minutes as needed. (Patient taking differently: Place 0.4 mg under the tongue every 5 (five) minutes as needed for chest pain.), Disp: 25 tablet, Rfl: 2   ondansetron  (ZOFRAN ) 8 MG tablet, Take 1 tablet (8 mg total) by mouth every 8 (eight) hours as needed for nausea or vomiting., Disp: 30 tablet, Rfl: 1  oxyCODONE  (ROXICODONE ) 15 MG immediate release tablet, Take 1 tablet (15 mg total) by mouth every 4 (four) hours as needed for severe pain (pain score 7-10)., Disp: 90 tablet, Rfl: 0   pantoprazole  (PROTONIX ) 40 MG tablet, Take 40 mg by mouth daily before breakfast., Disp: , Rfl:    polyethylene glycol (MIRALAX  / GLYCOLAX ) 17 g packet, Take 17 g by mouth 2 (two) times daily., Disp: , Rfl:    pregabalin  (LYRICA ) 100 MG capsule, TAKE 1 CAPSULE BY MOUTH IN THE MORNING, AND AT NOON, AND 2 CAPSULES AT NIGHT (Patient taking differently: Take 100-200 mg by mouth 2 (two) times daily. TAKE 1 CAPSULE BY MOUTH IN THE MORNING AND 2 CAPSULES AT NIGHT), Disp: 120 capsule, Rfl: 1   prochlorperazine  (COMPAZINE ) 10 MG tablet, Take 1 tablet (10 mg total) by mouth every 6 (six) hours as needed for nausea or vomiting., Disp: 30 tablet, Rfl: 1   rosuvastatin  (CRESTOR ) 40 MG tablet, Take 40 mg by mouth in the morning and at bedtime. (Patient not taking: Reported on 10/15/2023), Disp: , Rfl:    senna-docusate (SENOKOT-S) 8.6-50 MG tablet, Take 2 tablets by mouth 2 (two) times daily. (Patient taking differently: Take 2 tablets by mouth 2 (two) times daily as needed for moderate constipation.), Disp: , Rfl:    triamcinolone   (NASACORT ) 55 MCG/ACT AERO nasal inhaler, Place 1 spray into the nose daily as needed., Disp: , Rfl:    TYLENOL  500 MG tablet, Take 1,000 mg by mouth every 6 (six) hours as needed for mild pain (pain score 1-3) or headache., Disp: , Rfl:    calcium  carbonate (TUMS - DOSED IN MG ELEMENTAL CALCIUM ) 500 MG chewable tablet, Chew 1 tablet by mouth daily., Disp: , Rfl:    carvedilol  (COREG ) 3.125 MG tablet, Take 1 tablet (3.125 mg total) by mouth 2 (two) times daily., Disp: , Rfl:    levofloxacin  (LEVAQUIN ) 750 MG tablet, Take 1 tablet (750 mg total) by mouth daily for 3 days., Disp: 3 tablet, Rfl: 0   predniSONE  (DELTASONE ) 5 MG tablet, Take 6 tablets (30 mg total) by mouth daily with breakfast for 2 days, THEN 4 tablets (20 mg total) daily with breakfast for 2 days, THEN 2 tablets (10 mg total) daily with breakfast for 2 days, THEN 1 tablet (5 mg total) daily with breakfast for 2 days., Disp: 26 tablet, Rfl: 0   Physical Exam:   BP (!) 147/71 (BP Location: Right Arm, Patient Position: Sitting, Cuff Size: Normal)   Pulse 91   Ht 6' (1.829 m)   Wt 198 lb (89.8 kg)   SpO2 (!) 86%   BMI 26.85 kg/m   Pertinent Findings  CN II-XII intact Bilateral EAC clear, right TM intact with well pneumatized middle ear, left TM intact with serous effusion Weber 512: localizes left Rinne 512: AC= BC  Anterior rhinoscopy: Septum left deviation ; bilateral inferior turbinates with minimal hypertrophy; Nasal endoscopy was indicated to better evaluate the nose and paranasal sinuses, given the patient's history and exam findings, and is detailed below. No lesions of oral cavity/oropharynx No respiratory distress or stridor   Seprately Identifiable Procedures:  PROCEDURE: Bilateral Diagnostic Rigid Nasal Endoscopy Pre-procedure diagnosis: Left middle ear effusion; nasal congestion, rule out nasal lesion Post-procedure diagnosis: same Indication: See pre-procedure diagnosis and physical exam above Complications:  None apparent EBL: 0 mL Anesthesia: Lidocaine  4% and topical decongestant was topically sprayed in each nasal cavity  Description of Procedure:  Patient was identified. A rigid 30 degree  endoscope was utilized to evaluate the sinonasal cavities, mucosa, sinus ostia and turbinates and septum.  Overall, signs of mucosal inflammation are minimal but trace global mucosal edema noted. No nasopharyngeal lesion noted. No mucopurulence, polyps, or masses noted.   Right Middle meatus: clear Right SE Recess: clear Left MM: clear Left SE Recess: clear  Photodocumentation was obtained.  CPT CODE -- 16109 - Mod 25  Procedure: Bilateral ear microscopy with left myringotomy and tympanostomy tube placement (CPT [id number]) Pre-procedure diagnosis:  Left chronic serous otitis media Left mixed hearing loss Post-procedure diagnosis: same Indication: Patient is a 78 y.o. adult with pre-procedure diagnoses above. We discussed options: 1. Observation 2. Nasal sprays 3. Tympanostomy tube. Risks discussed, patient opted for tympanostomy tube placement. We had a long discussion about benefits and risks of Tympanostomy tube placement including pain, bleeding, infection, early or late extrusion, TM perforation, otorrhea, injury to middle or external ear structures, nature of tympanostomy tubes, cholesteatoma, hearing loss, among others. Consent was obtained prior to proceeding.  Findings: Right Ear: well aerated ME Left ear: serous effusion, landmarks visible, no evidence of cholesteatoma Successful tympanostomy tube placement on left  Complications: None apparent  Procedure details: Patient was placed semi recumbent on the exam chair. The left ear was addressed with binocular microscopy and any cerumen was cleaned from the ear canal. The tympanic membrane was visualized, with findings as above. A focal spot over the anterior-inferior TM was anesthetized with topical phenol. A myringotomy incision was then made  radially. Serous effusion was encountered. The middle ear cleft was suctioned. The PE tube was placed and ciprodex drops were applied. A cotton ball was placed at the meatus.  Patient tolerated the procedure well   Impression & Plans:  Daniel Whitaker is a 78 y.o. adult with:  1. Chronic serous otitis media of left ear   2. Dysfunction of left eustachian tube   3. Conductive hearing loss of right ear with restricted hearing of left ear   4. Sensorineural hearing loss (SNHL) of both ears   5. Nasal congestion   6. Perennial allergic rhinitis    Has had fairly extensive workup for this and appears consistent with left chronic serous otitis media with CHL and ETD after having URI/PNA. Not responding to sprays. Nasal endo w/o NP masses. D/w pt options and he opted for tymp tube which was placed  - Ofloxacin  ear drops 4 drops AS x7d - continue zyrtec and flonase BID - f/u in 3 months with audio  Thank you for allowing me the opportunity to care for your patient. Please do not hesitate to contact me should you have any other questions.  Sincerely, Milon Aloe, MD [facility] Phone: [phone] Fax: [phone]  10/23/2023, 11:38 AM   I have personally spent 42 minutes involved in face-to-face and non-face-to-face activities for this patient on the day of the visit.  Professional time spent excludes any procedures performed but includes the following activities, in addition to those noted in the documentation: preparing to see the patient (extensive review of outside documentation and CT results), performing a medically appropriate examination, counseling, ordering medications (ofloxacin ), documenting in the electronic health record, independently interpreting results (multiple CT, outside audiogram).

## 2023-10-12 ENCOUNTER — Other Ambulatory Visit: Payer: Self-pay

## 2023-10-14 ENCOUNTER — Encounter: Payer: Self-pay | Admitting: Hematology and Oncology

## 2023-10-14 ENCOUNTER — Inpatient Hospital Stay

## 2023-10-14 ENCOUNTER — Inpatient Hospital Stay (HOSPITAL_BASED_OUTPATIENT_CLINIC_OR_DEPARTMENT_OTHER): Admitting: Hematology and Oncology

## 2023-10-14 VITALS — BP 123/60 | HR 76 | Temp 98.1°F | Resp 20

## 2023-10-14 VITALS — BP 119/57 | HR 80 | Resp 18 | Ht 72.0 in | Wt 202.0 lb

## 2023-10-14 DIAGNOSIS — D849 Immunodeficiency, unspecified: Secondary | ICD-10-CM | POA: Diagnosis not present

## 2023-10-14 DIAGNOSIS — Z1152 Encounter for screening for COVID-19: Secondary | ICD-10-CM | POA: Diagnosis not present

## 2023-10-14 DIAGNOSIS — R6520 Severe sepsis without septic shock: Secondary | ICD-10-CM | POA: Diagnosis not present

## 2023-10-14 DIAGNOSIS — G893 Neoplasm related pain (acute) (chronic): Secondary | ICD-10-CM

## 2023-10-14 DIAGNOSIS — C9000 Multiple myeloma not having achieved remission: Secondary | ICD-10-CM

## 2023-10-14 DIAGNOSIS — A419 Sepsis, unspecified organism: Secondary | ICD-10-CM | POA: Diagnosis not present

## 2023-10-14 DIAGNOSIS — E041 Nontoxic single thyroid nodule: Secondary | ICD-10-CM | POA: Diagnosis not present

## 2023-10-14 DIAGNOSIS — Z7901 Long term (current) use of anticoagulants: Secondary | ICD-10-CM | POA: Diagnosis not present

## 2023-10-14 DIAGNOSIS — D630 Anemia in neoplastic disease: Secondary | ICD-10-CM | POA: Diagnosis not present

## 2023-10-14 DIAGNOSIS — J984 Other disorders of lung: Secondary | ICD-10-CM | POA: Diagnosis not present

## 2023-10-14 DIAGNOSIS — J9621 Acute and chronic respiratory failure with hypoxia: Secondary | ICD-10-CM | POA: Diagnosis not present

## 2023-10-14 DIAGNOSIS — L89311 Pressure ulcer of right buttock, stage 1: Secondary | ICD-10-CM | POA: Diagnosis not present

## 2023-10-14 DIAGNOSIS — E441 Mild protein-calorie malnutrition: Secondary | ICD-10-CM | POA: Diagnosis not present

## 2023-10-14 DIAGNOSIS — D472 Monoclonal gammopathy: Secondary | ICD-10-CM | POA: Diagnosis not present

## 2023-10-14 DIAGNOSIS — J90 Pleural effusion, not elsewhere classified: Secondary | ICD-10-CM | POA: Diagnosis not present

## 2023-10-14 DIAGNOSIS — R0602 Shortness of breath: Secondary | ICD-10-CM | POA: Diagnosis not present

## 2023-10-14 DIAGNOSIS — I480 Paroxysmal atrial fibrillation: Secondary | ICD-10-CM | POA: Diagnosis not present

## 2023-10-14 DIAGNOSIS — E876 Hypokalemia: Secondary | ICD-10-CM | POA: Diagnosis not present

## 2023-10-14 DIAGNOSIS — S2243XA Multiple fractures of ribs, bilateral, initial encounter for closed fracture: Secondary | ICD-10-CM | POA: Diagnosis not present

## 2023-10-14 DIAGNOSIS — G2581 Restless legs syndrome: Secondary | ICD-10-CM | POA: Diagnosis not present

## 2023-10-14 DIAGNOSIS — R0603 Acute respiratory distress: Secondary | ICD-10-CM | POA: Diagnosis not present

## 2023-10-14 DIAGNOSIS — E781 Pure hyperglyceridemia: Secondary | ICD-10-CM | POA: Diagnosis not present

## 2023-10-14 DIAGNOSIS — G9341 Metabolic encephalopathy: Secondary | ICD-10-CM | POA: Diagnosis not present

## 2023-10-14 DIAGNOSIS — J9601 Acute respiratory failure with hypoxia: Secondary | ICD-10-CM | POA: Diagnosis not present

## 2023-10-14 DIAGNOSIS — K219 Gastro-esophageal reflux disease without esophagitis: Secondary | ICD-10-CM | POA: Diagnosis not present

## 2023-10-14 DIAGNOSIS — K5909 Other constipation: Secondary | ICD-10-CM | POA: Diagnosis not present

## 2023-10-14 DIAGNOSIS — R918 Other nonspecific abnormal finding of lung field: Secondary | ICD-10-CM | POA: Diagnosis not present

## 2023-10-14 DIAGNOSIS — I2510 Atherosclerotic heart disease of native coronary artery without angina pectoris: Secondary | ICD-10-CM | POA: Diagnosis not present

## 2023-10-14 LAB — CMP (CANCER CENTER ONLY)
ALT: 6 U/L (ref 0–44)
AST: 7 U/L — ABNORMAL LOW (ref 15–41)
Albumin: 3.4 g/dL — ABNORMAL LOW (ref 3.5–5.0)
Alkaline Phosphatase: 162 U/L — ABNORMAL HIGH (ref 38–126)
Anion gap: 4 — ABNORMAL LOW (ref 5–15)
BUN: 19 mg/dL (ref 8–23)
CO2: 29 mmol/L (ref 22–32)
Calcium: 8.7 mg/dL — ABNORMAL LOW (ref 8.9–10.3)
Chloride: 106 mmol/L (ref 98–111)
Creatinine: 0.64 mg/dL (ref 0.61–1.24)
GFR, Estimated: >60 mL/min (ref >60)
Glucose, Bld: 119 mg/dL — ABNORMAL HIGH (ref 70–99)
Potassium: 3.5 mmol/L (ref 3.5–5.1)
Sodium: 139 mmol/L (ref 135–145)
Total Bilirubin: 0.4 mg/dL (ref 0.0–1.2)
Total Protein: 5.7 g/dL — ABNORMAL LOW (ref 6.5–8.1)

## 2023-10-14 LAB — CBC WITH DIFFERENTIAL (CANCER CENTER ONLY)
Abs Immature Granulocytes: 0.03 10*3/uL (ref 0.00–0.07)
Basophils Absolute: 0.1 10*3/uL (ref 0.0–0.1)
Basophils Relative: 1 %
Eosinophils Absolute: 0.3 10*3/uL (ref 0.0–0.5)
Eosinophils Relative: 6 %
HCT: 27.9 % — ABNORMAL LOW (ref 39.0–52.0)
Hemoglobin: 9.1 g/dL — ABNORMAL LOW (ref 13.0–17.0)
Immature Granulocytes: 1 %
Lymphocytes Relative: 14 %
Lymphs Abs: 0.7 10*3/uL (ref 0.7–4.0)
MCH: 27.2 pg (ref 26.0–34.0)
MCHC: 32.6 g/dL (ref 30.0–36.0)
MCV: 83.5 fL (ref 80.0–100.0)
Monocytes Absolute: 0.4 10*3/uL (ref 0.1–1.0)
Monocytes Relative: 7 %
Neutro Abs: 3.8 10*3/uL (ref 1.7–7.7)
Neutrophils Relative %: 71 %
Platelet Count: 234 10*3/uL (ref 150–400)
RBC: 3.34 MIL/uL — ABNORMAL LOW (ref 4.22–5.81)
RDW: 16.9 % — ABNORMAL HIGH (ref 11.5–15.5)
WBC Count: 5.3 10*3/uL (ref 4.0–10.5)
nRBC: 0 % (ref 0.0–0.2)

## 2023-10-14 MED ORDER — SODIUM CHLORIDE 0.9% FLUSH
10.0000 mL | Freq: Once | INTRAVENOUS | Status: AC
  Administered 2023-10-14: 10 mL

## 2023-10-14 MED ORDER — DEXAMETHASONE SODIUM PHOSPHATE 10 MG/ML IJ SOLN
10.0000 mg | Freq: Once | INTRAMUSCULAR | Status: AC
  Administered 2023-10-14: 10 mg via INTRAVENOUS

## 2023-10-14 MED ORDER — SODIUM CHLORIDE 0.9 % IV SOLN: INTRAVENOUS | Status: DC

## 2023-10-14 MED ORDER — SODIUM CHLORIDE 0.9 % IV SOLN: Freq: Once | INTRAVENOUS | Status: AC

## 2023-10-14 MED ORDER — HEPARIN SOD (PORK) LOCK FLUSH 100 UNIT/ML IV SOLN
500.0000 [IU] | Freq: Once | INTRAVENOUS | Status: AC | PRN
  Administered 2023-10-14: 500 [IU]

## 2023-10-14 MED ORDER — DEXTROSE 5 % IV SOLN
36.0000 mg/m2 | Freq: Once | INTRAVENOUS | Status: AC
  Administered 2023-10-14: 80 mg via INTRAVENOUS
  Filled 2023-10-14: qty 30

## 2023-10-14 MED ORDER — OXYCODONE HCL 5 MG PO TABS
10.0000 mg | ORAL_TABLET | Freq: Once | ORAL | Status: AC
  Administered 2023-10-14: 10 mg via ORAL
  Filled 2023-10-14: qty 2

## 2023-10-14 MED ORDER — SODIUM CHLORIDE 0.9% FLUSH
10.0000 mL | INTRAVENOUS | Status: DC | PRN
  Administered 2023-10-14: 10 mL

## 2023-10-14 MED ORDER — SODIUM CHLORIDE 0.9 % IV SOLN
500.0000 mg | Freq: Once | INTRAVENOUS | Status: AC
  Administered 2023-10-14: 500 mg via INTRAVENOUS
  Filled 2023-10-14: qty 25

## 2023-10-14 NOTE — Patient Instructions (Addendum)
 [facility] - A DEPT OF [facility]  Discharge Instructions: Thank you for choosing [facility] to provide your oncology and hematology care.   If you have a lab appointment with the Cancer Center, please go directly to the Cancer Center and check in at the registration area.   Wear comfortable clothing and clothing appropriate for easy access to any Portacath or PICC line.   We strive to give you quality time with your provider. You may need to reschedule your appointment if you arrive late (15 or more minutes).  Arriving late affects you and other patients whose appointments are after yours.  Also, if you miss three or more appointments without notifying the office, you may be dismissed from the clinic at the provider's discretion.      For prescription refill requests, have your pharmacy contact our office and allow 72 hours for refills to be completed.    Today you received the following chemotherapy and/or immunotherapy agent: Cychlophosphamide (Cytoxan ) and Carfilzomib  (Kyprolis ).   To help prevent nausea and vomiting after your treatment, we encourage you to take your nausea medication as directed.  BELOW ARE SYMPTOMS THAT SHOULD BE REPORTED IMMEDIATELY: *FEVER GREATER THAN 100.4 F (38 C) OR HIGHER *CHILLS OR SWEATING *NAUSEA AND VOMITING THAT IS NOT CONTROLLED WITH YOUR NAUSEA MEDICATION *UNUSUAL SHORTNESS OF BREATH *UNUSUAL BRUISING OR BLEEDING *URINARY PROBLEMS (pain or burning when urinating, or frequent urination) *BOWEL PROBLEMS (unusual diarrhea, constipation, pain near the anus) TENDERNESS IN MOUTH AND THROAT WITH OR WITHOUT PRESENCE OF ULCERS (sore throat, sores in mouth, or a toothache) UNUSUAL RASH, SWELLING OR PAIN  UNUSUAL VAGINAL DISCHARGE OR ITCHING   Items with * indicate a potential emergency and should be followed up as soon as possible or go to the Emergency Department if any problems should occur.  Please show the  CHEMOTHERAPY ALERT CARD or IMMUNOTHERAPY ALERT CARD at check-in to the Emergency Department and triage nurse.  Should you have questions after your visit or need to cancel or reschedule your appointment, please contact [facility] - A DEPT OF [facility]  Dept: [phone]  and follow the prompts.  Office hours are 8:00 a.m. to 4:30 p.m. Monday - Friday. Please note that voicemails left after 4:00 p.m. may not be returned until the following business day.  We are closed weekends and major holidays. You have access to a nurse at all times for urgent questions. Please call the main number to the clinic Dept: [phone] and follow the prompts.   For any non-urgent questions, you may also contact your provider using MyChart. We now offer e-Visits for anyone 67 and older to request care online for non-urgent symptoms. For details visit [email or web address].   Also download the MyChart app! Go to the app store, search "MyChart", open the app, select [place], and log in with your MyChart username and password.

## 2023-10-14 NOTE — Assessment & Plan Note (Addendum)
 He has good pain control He will continue oxycodone  taper

## 2023-10-14 NOTE — Assessment & Plan Note (Addendum)
 He has shortness of breath on exertion and at rest He is using oxygen His oxygen saturation is adequate His lung exam is unremarkable His anemia has been stable I suspect his shortness of breath is due to shallow breathing I suspect he is in a lot of pain due to recent steroid withdrawal I recommend the patient to take pain medicine as needed

## 2023-10-14 NOTE — Progress Notes (Signed)
 [facility] OFFICE PROGRESS NOTE  Patient Care Team: Glena Landau, MD as PCP - General (Family Medicine) Eilleen Grates, MD as PCP - Cardiology (Cardiology)  Assessment & Plan Multiple myeloma not having achieved remission [facility]) He has IgA kappa multiple myeloma with multiple bone fractures at presentation in 2024.  Bone marrow biopsy showed 15% involvement, standard risk myeloma FISH and cytogenetics  He ha initially good partial response with combination chemotherapy with daratumumab , lenalidomide , bortezomib  and dexamethasone  but then started to have progression of disease Treatment was switched to combination treatment with carfilzomib , cyclophosphamide  and dexamethasone  Recent myeloma panel was shared with the patient which show positive response to therapy  He will continue vitamin D  supplement  Due to findings of hypocalcemia, he is recommended to resume calcium  supplement He is on acyclovir  for antimicrobial prophylaxis He had received Zometa  on April 1, next dose will be due in July  Will continue treatment today as scheduled Mild protein-calorie malnutrition (HCC) Since recent steroid withdrawal, he has lost weight He is noted to have progressive mild protein calorie malnutrition We discussed importance of increased protein in his diet Shortness of breath on exertion He has shortness of breath on exertion and at rest He is using oxygen His oxygen saturation is adequate His lung exam is unremarkable His anemia has been stable I suspect his shortness of breath is due to shallow breathing I suspect he is in a lot of pain due to recent steroid withdrawal I recommend the patient to take pain medicine as needed Cancer related pain He has good pain control He will continue oxycodone  taper  No orders of the defined types were placed in this encounter.    [name], MD  INTERVAL HISTORY: he returns for treatment follow-up Complications related to  previous cycle of chemotherapy included anemia,, weight loss,, cancer associated pain,, and shortness of breath requiring oxygen Since last week, he noticed some shortness of breath and started to use oxygen His family noted he has poor oral intake and has lost more weight He has not been taking much pain medicine lately I reviewed test results with the patient and his daughter  PHYSICAL EXAMINATION: ECOG PERFORMANCE STATUS: 2 - Symptomatic, <50% confined to bed  Vitals:   10/14/23 1423  BP: (!) 119/57  Pulse: 80  Resp: 18  SpO2: 92%   Filed Weights   10/14/23 1423  Weight: 202 lb (91.6 kg)   Bilateral chest examination reveal good breath sounds bilaterally Relevant data reviewed during this visit included CBC, CMP

## 2023-10-14 NOTE — Assessment & Plan Note (Addendum)
 Since recent steroid withdrawal, he has lost weight He is noted to have progressive mild protein calorie malnutrition We discussed importance of increased protein in his diet

## 2023-10-14 NOTE — Assessment & Plan Note (Addendum)
 He has IgA kappa multiple myeloma with multiple bone fractures at presentation in 2024.  Bone marrow biopsy showed 15% involvement, standard risk myeloma FISH and cytogenetics  He ha initially good partial response with combination chemotherapy with daratumumab , lenalidomide , bortezomib  and dexamethasone  but then started to have progression of disease Treatment was switched to combination treatment with carfilzomib , cyclophosphamide  and dexamethasone  Recent myeloma panel was shared with the patient which show positive response to therapy  He will continue vitamin D  supplement  Due to findings of hypocalcemia, he is recommended to resume calcium  supplement He is on acyclovir  for antimicrobial prophylaxis He had received Zometa  on April 1, next dose will be due in July  Will continue treatment today as scheduled

## 2023-10-15 ENCOUNTER — Inpatient Hospital Stay (HOSPITAL_COMMUNITY)
Admission: EM | Admit: 2023-10-15 | Discharge: 2023-10-20 | DRG: 871 | Disposition: A | Discharge status: Home / Self Care | Attending: Student | Admitting: Student

## 2023-10-15 ENCOUNTER — Inpatient Hospital Stay

## 2023-10-15 ENCOUNTER — Other Ambulatory Visit: Payer: Self-pay

## 2023-10-15 ENCOUNTER — Encounter (HOSPITAL_COMMUNITY): Payer: Self-pay

## 2023-10-15 ENCOUNTER — Emergency Department (HOSPITAL_COMMUNITY)

## 2023-10-15 DIAGNOSIS — E441 Mild protein-calorie malnutrition: Secondary | ICD-10-CM | POA: Diagnosis present

## 2023-10-15 DIAGNOSIS — K219 Gastro-esophageal reflux disease without esophagitis: Secondary | ICD-10-CM | POA: Diagnosis present

## 2023-10-15 DIAGNOSIS — E440 Moderate protein-calorie malnutrition: Secondary | ICD-10-CM | POA: Insufficient documentation

## 2023-10-15 DIAGNOSIS — R0902 Hypoxemia: Secondary | ICD-10-CM

## 2023-10-15 DIAGNOSIS — D630 Anemia in neoplastic disease: Secondary | ICD-10-CM | POA: Diagnosis present

## 2023-10-15 DIAGNOSIS — H409 Unspecified glaucoma: Secondary | ICD-10-CM | POA: Diagnosis present

## 2023-10-15 DIAGNOSIS — I499 Cardiac arrhythmia, unspecified: Secondary | ICD-10-CM | POA: Diagnosis not present

## 2023-10-15 DIAGNOSIS — G9341 Metabolic encephalopathy: Secondary | ICD-10-CM | POA: Diagnosis present

## 2023-10-15 DIAGNOSIS — Z7952 Long term (current) use of systemic steroids: Secondary | ICD-10-CM

## 2023-10-15 DIAGNOSIS — J189 Pneumonia, unspecified organism: Secondary | ICD-10-CM | POA: Diagnosis not present

## 2023-10-15 DIAGNOSIS — L8990 Pressure ulcer of unspecified site, unspecified stage: Secondary | ICD-10-CM | POA: Diagnosis present

## 2023-10-15 DIAGNOSIS — L89311 Pressure ulcer of right buttock, stage 1: Secondary | ICD-10-CM | POA: Diagnosis present

## 2023-10-15 DIAGNOSIS — C9000 Multiple myeloma not having achieved remission: Secondary | ICD-10-CM | POA: Diagnosis not present

## 2023-10-15 DIAGNOSIS — Z87891 Personal history of nicotine dependence: Secondary | ICD-10-CM

## 2023-10-15 DIAGNOSIS — Z955 Presence of coronary angioplasty implant and graft: Secondary | ICD-10-CM

## 2023-10-15 DIAGNOSIS — D638 Anemia in other chronic diseases classified elsewhere: Secondary | ICD-10-CM | POA: Diagnosis present

## 2023-10-15 DIAGNOSIS — Z7901 Long term (current) use of anticoagulants: Secondary | ICD-10-CM

## 2023-10-15 DIAGNOSIS — J90 Pleural effusion, not elsewhere classified: Secondary | ICD-10-CM | POA: Diagnosis present

## 2023-10-15 DIAGNOSIS — R6520 Severe sepsis without septic shock: Secondary | ICD-10-CM | POA: Diagnosis present

## 2023-10-15 DIAGNOSIS — E8770 Fluid overload, unspecified: Secondary | ICD-10-CM | POA: Diagnosis present

## 2023-10-15 DIAGNOSIS — Z888 Allergy status to other drugs, medicaments and biological substances status: Secondary | ICD-10-CM

## 2023-10-15 DIAGNOSIS — R0603 Acute respiratory distress: Principal | ICD-10-CM

## 2023-10-15 DIAGNOSIS — Y95 Nosocomial condition: Secondary | ICD-10-CM | POA: Diagnosis present

## 2023-10-15 DIAGNOSIS — C801 Malignant (primary) neoplasm, unspecified: Secondary | ICD-10-CM | POA: Diagnosis not present

## 2023-10-15 DIAGNOSIS — E041 Nontoxic single thyroid nodule: Secondary | ICD-10-CM | POA: Diagnosis present

## 2023-10-15 DIAGNOSIS — R7989 Other specified abnormal findings of blood chemistry: Secondary | ICD-10-CM | POA: Diagnosis present

## 2023-10-15 DIAGNOSIS — K5909 Other constipation: Secondary | ICD-10-CM | POA: Diagnosis present

## 2023-10-15 DIAGNOSIS — I480 Paroxysmal atrial fibrillation: Secondary | ICD-10-CM | POA: Diagnosis not present

## 2023-10-15 DIAGNOSIS — G629 Polyneuropathy, unspecified: Secondary | ICD-10-CM | POA: Diagnosis present

## 2023-10-15 DIAGNOSIS — Z8249 Family history of ischemic heart disease and other diseases of the circulatory system: Secondary | ICD-10-CM

## 2023-10-15 DIAGNOSIS — J9601 Acute respiratory failure with hypoxia: Secondary | ICD-10-CM

## 2023-10-15 DIAGNOSIS — D61818 Other pancytopenia: Secondary | ICD-10-CM | POA: Diagnosis not present

## 2023-10-15 DIAGNOSIS — R404 Transient alteration of awareness: Secondary | ICD-10-CM | POA: Diagnosis not present

## 2023-10-15 DIAGNOSIS — D472 Monoclonal gammopathy: Secondary | ICD-10-CM | POA: Diagnosis present

## 2023-10-15 DIAGNOSIS — R918 Other nonspecific abnormal finding of lung field: Secondary | ICD-10-CM | POA: Diagnosis not present

## 2023-10-15 DIAGNOSIS — Z79899 Other long term (current) drug therapy: Secondary | ICD-10-CM

## 2023-10-15 DIAGNOSIS — Z1152 Encounter for screening for COVID-19: Secondary | ICD-10-CM

## 2023-10-15 DIAGNOSIS — I10 Essential (primary) hypertension: Secondary | ICD-10-CM | POA: Diagnosis present

## 2023-10-15 DIAGNOSIS — I2510 Atherosclerotic heart disease of native coronary artery without angina pectoris: Secondary | ICD-10-CM | POA: Diagnosis present

## 2023-10-15 DIAGNOSIS — C7951 Secondary malignant neoplasm of bone: Secondary | ICD-10-CM | POA: Diagnosis not present

## 2023-10-15 DIAGNOSIS — M1990 Unspecified osteoarthritis, unspecified site: Secondary | ICD-10-CM | POA: Diagnosis present

## 2023-10-15 DIAGNOSIS — J9690 Respiratory failure, unspecified, unspecified whether with hypoxia or hypercapnia: Secondary | ICD-10-CM | POA: Diagnosis present

## 2023-10-15 DIAGNOSIS — A419 Sepsis, unspecified organism: Principal | ICD-10-CM | POA: Diagnosis present

## 2023-10-15 DIAGNOSIS — M419 Scoliosis, unspecified: Secondary | ICD-10-CM | POA: Diagnosis present

## 2023-10-15 DIAGNOSIS — J9621 Acute and chronic respiratory failure with hypoxia: Secondary | ICD-10-CM | POA: Diagnosis present

## 2023-10-15 DIAGNOSIS — D849 Immunodeficiency, unspecified: Secondary | ICD-10-CM | POA: Diagnosis present

## 2023-10-15 DIAGNOSIS — Z743 Need for continuous supervision: Secondary | ICD-10-CM | POA: Diagnosis not present

## 2023-10-15 DIAGNOSIS — G893 Neoplasm related pain (acute) (chronic): Secondary | ICD-10-CM | POA: Diagnosis present

## 2023-10-15 DIAGNOSIS — G4733 Obstructive sleep apnea (adult) (pediatric): Secondary | ICD-10-CM | POA: Diagnosis not present

## 2023-10-15 DIAGNOSIS — E876 Hypokalemia: Secondary | ICD-10-CM | POA: Diagnosis present

## 2023-10-15 DIAGNOSIS — Z6828 Body mass index (BMI) 28.0-28.9, adult: Secondary | ICD-10-CM

## 2023-10-15 DIAGNOSIS — R0602 Shortness of breath: Secondary | ICD-10-CM | POA: Diagnosis not present

## 2023-10-15 DIAGNOSIS — J849 Interstitial pulmonary disease, unspecified: Secondary | ICD-10-CM | POA: Diagnosis not present

## 2023-10-15 DIAGNOSIS — J984 Other disorders of lung: Secondary | ICD-10-CM | POA: Diagnosis not present

## 2023-10-15 DIAGNOSIS — G2581 Restless legs syndrome: Secondary | ICD-10-CM | POA: Diagnosis present

## 2023-10-15 DIAGNOSIS — Z880 Allergy status to penicillin: Secondary | ICD-10-CM

## 2023-10-15 DIAGNOSIS — Z79891 Long term (current) use of opiate analgesic: Secondary | ICD-10-CM

## 2023-10-15 DIAGNOSIS — E781 Pure hyperglyceridemia: Secondary | ICD-10-CM | POA: Diagnosis present

## 2023-10-15 DIAGNOSIS — R0600 Dyspnea, unspecified: Secondary | ICD-10-CM | POA: Diagnosis not present

## 2023-10-15 DIAGNOSIS — E46 Unspecified protein-calorie malnutrition: Secondary | ICD-10-CM | POA: Diagnosis not present

## 2023-10-15 DIAGNOSIS — S2243XA Multiple fractures of ribs, bilateral, initial encounter for closed fracture: Secondary | ICD-10-CM | POA: Diagnosis not present

## 2023-10-15 DIAGNOSIS — R531 Weakness: Secondary | ICD-10-CM

## 2023-10-15 LAB — RESPIRATORY PANEL BY PCR

## 2023-10-15 LAB — CBC WITH DIFFERENTIAL/PLATELET
Abs Immature Granulocytes: 0.07 10*3/uL (ref 0.00–0.07)
Basophils Absolute: 0.1 10*3/uL (ref 0.0–0.1)
Basophils Relative: 1 %
Eosinophils Absolute: 0.1 10*3/uL (ref 0.0–0.5)
Eosinophils Relative: 1 %
HCT: 30.9 % — ABNORMAL LOW (ref 39.0–52.0)
Hemoglobin: 10 g/dL — ABNORMAL LOW (ref 13.0–17.0)
Immature Granulocytes: 1 %
Lymphocytes Relative: 6 %
Lymphs Abs: 0.5 10*3/uL — ABNORMAL LOW (ref 0.7–4.0)
MCH: 27.8 pg (ref 26.0–34.0)
MCHC: 32.4 g/dL (ref 30.0–36.0)
MCV: 85.8 fL (ref 80.0–100.0)
Monocytes Absolute: 0.6 10*3/uL (ref 0.1–1.0)
Monocytes Relative: 7 %
Neutro Abs: 7.7 10*3/uL (ref 1.7–7.7)
Neutrophils Relative %: 84 %
Platelets: 261 10*3/uL (ref 150–400)
RBC: 3.6 MIL/uL — ABNORMAL LOW (ref 4.22–5.81)
RDW: 17.1 % — ABNORMAL HIGH (ref 11.5–15.5)
WBC: 9 10*3/uL (ref 4.0–10.5)
nRBC: 0 % (ref 0.0–0.2)

## 2023-10-15 LAB — COMPREHENSIVE METABOLIC PANEL WITH GFR
ALT: 11 U/L (ref 0–44)
ALT: 11 U/L (ref 0–44)
AST: 15 U/L (ref 15–41)
AST: 13 U/L — ABNORMAL LOW (ref 15–41)
Albumin: 2.7 g/dL — ABNORMAL LOW (ref 3.5–5.0)
Albumin: 2.7 g/dL — ABNORMAL LOW (ref 3.5–5.0)
Alkaline Phosphatase: 150 U/L — ABNORMAL HIGH (ref 38–126)
Alkaline Phosphatase: 149 U/L — ABNORMAL HIGH (ref 38–126)
Anion gap: 8 (ref 5–15)
Anion gap: 9 (ref 5–15)
BUN: 23 mg/dL (ref 8–23)
BUN: 25 mg/dL — ABNORMAL HIGH (ref 8–23)
CO2: 22 mmol/L (ref 22–32)
CO2: 22 mmol/L (ref 22–32)
Calcium: 8.5 mg/dL — ABNORMAL LOW (ref 8.9–10.3)
Calcium: 8.3 mg/dL — ABNORMAL LOW (ref 8.9–10.3)
Chloride: 109 mmol/L (ref 98–111)
Chloride: 107 mmol/L (ref 98–111)
Creatinine, Ser: 1 mg/dL (ref 0.61–1.24)
Creatinine, Ser: 1.05 mg/dL (ref 0.61–1.24)
GFR, Estimated: >60 mL/min (ref >60)
GFR, Estimated: >60 mL/min (ref >60)
Glucose, Bld: 147 mg/dL — ABNORMAL HIGH (ref 70–99)
Glucose, Bld: 148 mg/dL — ABNORMAL HIGH (ref 70–99)
Potassium: 3.5 mmol/L (ref 3.5–5.1)
Potassium: 3.6 mmol/L (ref 3.5–5.1)
Sodium: 139 mmol/L (ref 135–145)
Sodium: 138 mmol/L (ref 135–145)
Total Bilirubin: 0.4 mg/dL (ref 0.0–1.2)
Total Bilirubin: 0.7 mg/dL (ref 0.0–1.2)
Total Protein: 5.5 g/dL — ABNORMAL LOW (ref 6.5–8.1)
Total Protein: 4.9 g/dL — ABNORMAL LOW (ref 6.5–8.1)

## 2023-10-15 LAB — I-STAT ARTERIAL BLOOD GAS, ED
Acid-base deficit: 3 mmol/L — ABNORMAL HIGH (ref 0.0–2.0)
Bicarbonate: 22.1 mmol/L (ref 20.0–28.0)
Calcium, Ion: 1.23 mmol/L (ref 1.15–1.40)
HCT: 26 % — ABNORMAL LOW (ref 39.0–52.0)
Hemoglobin: 8.8 g/dL — ABNORMAL LOW (ref 13.0–17.0)
O2 Saturation: 100 %
Patient temperature: 100.1
Potassium: 3.3 mmol/L — ABNORMAL LOW (ref 3.5–5.1)
Sodium: 140 mmol/L (ref 135–145)
TCO2: 23 mmol/L (ref 22–32)
pCO2 arterial: 40.7 mmHg (ref 32–48)
pH, Arterial: 7.346 — ABNORMAL LOW (ref 7.35–7.45)
pO2, Arterial: 194 mmHg — ABNORMAL HIGH (ref 83–108)

## 2023-10-15 LAB — TROPONIN I (HIGH SENSITIVITY)
Troponin I (High Sensitivity): 14 ng/L (ref <18)
Troponin I (High Sensitivity): 29 ng/L — ABNORMAL HIGH (ref <18)

## 2023-10-15 LAB — I-STAT VENOUS BLOOD GAS, ED
Acid-base deficit: 3 mmol/L — ABNORMAL HIGH (ref 0.0–2.0)
Bicarbonate: 21.6 mmol/L (ref 20.0–28.0)
Calcium, Ion: 1.2 mmol/L (ref 1.15–1.40)
HCT: 29 % — ABNORMAL LOW (ref 39.0–52.0)
Hemoglobin: 9.9 g/dL — ABNORMAL LOW (ref 13.0–17.0)
O2 Saturation: 99 %
Potassium: 3.6 mmol/L (ref 3.5–5.1)
Sodium: 140 mmol/L (ref 135–145)
TCO2: 23 mmol/L (ref 22–32)
pCO2, Ven: 37.1 mmHg — ABNORMAL LOW (ref 44–60)
pH, Ven: 7.372 (ref 7.25–7.43)
pO2, Ven: 147 mmHg — ABNORMAL HIGH (ref 32–45)

## 2023-10-15 LAB — BRAIN NATRIURETIC PEPTIDE: B Natriuretic Peptide: 100.5 pg/mL — ABNORMAL HIGH (ref 0.0–100.0)

## 2023-10-15 LAB — RESP PANEL BY RT-PCR (RSV, FLU A&B, COVID)  RVPGX2
Influenza A by PCR: NEGATIVE
Influenza B by PCR: NEGATIVE
Resp Syncytial Virus by PCR: NEGATIVE
SARS Coronavirus 2 by RT PCR: NEGATIVE

## 2023-10-15 LAB — CBC
HCT: 30.1 % — ABNORMAL LOW (ref 39.0–52.0)
Hemoglobin: 9.7 g/dL — ABNORMAL LOW (ref 13.0–17.0)
MCH: 27.9 pg (ref 26.0–34.0)
MCHC: 32.2 g/dL (ref 30.0–36.0)
MCV: 86.5 fL (ref 80.0–100.0)
Platelets: 227 10*3/uL (ref 150–400)
RBC: 3.48 MIL/uL — ABNORMAL LOW (ref 4.22–5.81)
RDW: 17.2 % — ABNORMAL HIGH (ref 11.5–15.5)
WBC: 7.8 10*3/uL (ref 4.0–10.5)
nRBC: 0 % (ref 0.0–0.2)

## 2023-10-15 LAB — PROTIME-INR
INR: 1.5 — ABNORMAL HIGH (ref 0.8–1.2)
Prothrombin Time: 18 s — ABNORMAL HIGH (ref 11.4–15.2)

## 2023-10-15 LAB — GLUCOSE, CAPILLARY
Glucose-Capillary: 146 mg/dL — ABNORMAL HIGH (ref 70–99)
Glucose-Capillary: 155 mg/dL — ABNORMAL HIGH (ref 70–99)
Glucose-Capillary: 212 mg/dL — ABNORMAL HIGH (ref 70–99)
Glucose-Capillary: 149 mg/dL — ABNORMAL HIGH (ref 70–99)

## 2023-10-15 LAB — MRSA NEXT GEN BY PCR, NASAL: MRSA by PCR Next Gen: NOT DETECTED

## 2023-10-15 LAB — MAGNESIUM: Magnesium: 1.8 mg/dL (ref 1.7–2.4)

## 2023-10-15 LAB — HEMOGLOBIN A1C
Hgb A1c MFr Bld: 5.2 % (ref 4.8–5.6)
Mean Plasma Glucose: 102.54 mg/dL

## 2023-10-15 LAB — CORTISOL: Cortisol, Plasma: 17.8 ug/dL

## 2023-10-15 LAB — I-STAT CG4 LACTIC ACID, ED
Lactic Acid, Venous: 1.3 mmol/L (ref 0.5–1.9)
Lactic Acid, Venous: 1.1 mmol/L (ref 0.5–1.9)

## 2023-10-15 LAB — PHOSPHORUS: Phosphorus: 2.3 mg/dL — ABNORMAL LOW (ref 2.5–4.6)

## 2023-10-15 MED ORDER — PREGABALIN 100 MG PO CAPS
200.0000 mg | ORAL_CAPSULE | Freq: Every day | ORAL | Status: DC
  Administered 2023-10-16 – 2023-10-19 (×4): 200 mg via ORAL
  Filled 2023-10-15 (×4): qty 2

## 2023-10-15 MED ORDER — METHYLPREDNISOLONE SODIUM SUCC 40 MG IJ SOLR
40.0000 mg | Freq: Two times a day (BID) | INTRAMUSCULAR | Status: DC
  Administered 2023-10-15 – 2023-10-18 (×6): 40 mg via INTRAVENOUS
  Filled 2023-10-15 (×6): qty 1

## 2023-10-15 MED ORDER — DORZOLAMIDE HCL-TIMOLOL MAL 2-0.5 % OP SOLN
1.0000 [drp] | Freq: Two times a day (BID) | OPHTHALMIC | Status: DC
  Administered 2023-10-15 – 2023-10-20 (×10): 1 [drp] via OPHTHALMIC
  Filled 2023-10-15: qty 10

## 2023-10-15 MED ORDER — LACTATED RINGERS IV SOLN: INTRAVENOUS | Status: DC

## 2023-10-15 MED ORDER — SODIUM CHLORIDE 0.9 % IV SOLN
2.0000 g | Freq: Once | INTRAVENOUS | Status: AC
  Administered 2023-10-15: 2 g via INTRAVENOUS
  Filled 2023-10-15: qty 10

## 2023-10-15 MED ORDER — LIDOCAINE-PRILOCAINE 2.5-2.5 % EX CREA
TOPICAL_CREAM | Freq: Once | CUTANEOUS | Status: DC
  Filled 2023-10-15: qty 5

## 2023-10-15 MED ORDER — SODIUM CHLORIDE 0.9% FLUSH: 10.0000 mL | INTRAVENOUS | Status: DC | PRN

## 2023-10-15 MED ORDER — SODIUM CHLORIDE 0.9% FLUSH
10.0000 mL | Freq: Two times a day (BID) | INTRAVENOUS | Status: DC
  Administered 2023-10-15 – 2023-10-17 (×5): 10 mL
  Administered 2023-10-18: 20 mL
  Administered 2023-10-18 – 2023-10-20 (×4): 10 mL

## 2023-10-15 MED ORDER — ACYCLOVIR 200 MG PO CAPS
400.0000 mg | ORAL_CAPSULE | Freq: Every day | ORAL | Status: DC
  Administered 2023-10-15 – 2023-10-20 (×6): 400 mg via ORAL
  Filled 2023-10-15 (×6): qty 2

## 2023-10-15 MED ORDER — DOCUSATE SODIUM 100 MG PO CAPS: 100.0000 mg | ORAL_CAPSULE | Freq: Two times a day (BID) | ORAL | Status: DC | PRN

## 2023-10-15 MED ORDER — APIXABAN 5 MG PO TABS
5.0000 mg | ORAL_TABLET | Freq: Two times a day (BID) | ORAL | Status: DC
  Administered 2023-10-15 – 2023-10-20 (×11): 5 mg via ORAL
  Filled 2023-10-15 (×11): qty 1

## 2023-10-15 MED ORDER — METHYLPREDNISOLONE SODIUM SUCC 40 MG IJ SOLR: 40.0000 mg | Freq: Two times a day (BID) | INTRAMUSCULAR | Status: DC

## 2023-10-15 MED ORDER — VANCOMYCIN HCL IN DEXTROSE 1-5 GM/200ML-% IV SOLN: 1000.0000 mg | Freq: Once | INTRAVENOUS | Status: DC

## 2023-10-15 MED ORDER — PREGABALIN 100 MG PO CAPS
100.0000 mg | ORAL_CAPSULE | Freq: Every morning | ORAL | Status: DC
  Administered 2023-10-16 – 2023-10-20 (×5): 100 mg via ORAL
  Filled 2023-10-15 (×5): qty 1

## 2023-10-15 MED ORDER — DIAZEPAM 5 MG/ML IJ SOLN
2.5000 mg | Freq: Once | INTRAMUSCULAR | Status: AC
  Administered 2023-10-15: 2.5 mg via INTRAVENOUS
  Filled 2023-10-15: qty 2

## 2023-10-15 MED ORDER — IPRATROPIUM-ALBUTEROL 0.5-2.5 (3) MG/3ML IN SOLN
3.0000 mL | Freq: Four times a day (QID) | RESPIRATORY_TRACT | Status: DC | PRN
  Administered 2023-10-16 – 2023-10-18 (×2): 3 mL via RESPIRATORY_TRACT
  Filled 2023-10-15: qty 3

## 2023-10-15 MED ORDER — VANCOMYCIN HCL IN DEXTROSE 1-5 GM/200ML-% IV SOLN
1000.0000 mg | Freq: Two times a day (BID) | INTRAVENOUS | Status: DC
  Administered 2023-10-15 – 2023-10-16 (×2): 1000 mg via INTRAVENOUS
  Filled 2023-10-15 (×2): qty 200

## 2023-10-15 MED ORDER — BRIMONIDINE TARTRATE 0.2 % OP SOLN
1.0000 [drp] | Freq: Two times a day (BID) | OPHTHALMIC | Status: DC
  Administered 2023-10-15 – 2023-10-20 (×10): 1 [drp] via OPHTHALMIC
  Filled 2023-10-15: qty 5

## 2023-10-15 MED ORDER — ACETAMINOPHEN 650 MG RE SUPP
650.0000 mg | RECTAL | Status: DC | PRN
  Administered 2023-10-15: 650 mg via RECTAL
  Filled 2023-10-15: qty 1

## 2023-10-15 MED ORDER — POLYETHYLENE GLYCOL 3350 17 G PO PACK: 17.0000 g | PACK | Freq: Every day | ORAL | Status: DC | PRN

## 2023-10-15 MED ORDER — PREGABALIN 100 MG PO CAPS
100.0000 mg | ORAL_CAPSULE | Freq: Two times a day (BID) | ORAL | Status: DC
  Administered 2023-10-15: 100 mg via ORAL
  Filled 2023-10-15: qty 1

## 2023-10-15 MED ORDER — CHLORHEXIDINE GLUCONATE CLOTH 2 % EX PADS
6.0000 | MEDICATED_PAD | Freq: Every day | CUTANEOUS | Status: DC
  Administered 2023-10-15 – 2023-10-19 (×5): 6 via TOPICAL

## 2023-10-15 MED ORDER — OFLOXACIN 0.3 % OP SOLN
4.0000 [drp] | Freq: Two times a day (BID) | OPHTHALMIC | Status: AC
Start: 2023-10-15 — End: 2023-10-18
  Administered 2023-10-15 – 2023-10-18 (×7): 4 [drp] via OTIC
  Filled 2023-10-15: qty 5

## 2023-10-15 MED ORDER — IPRATROPIUM-ALBUTEROL 0.5-2.5 (3) MG/3ML IN SOLN
3.0000 mL | Freq: Four times a day (QID) | RESPIRATORY_TRACT | Status: DC
  Administered 2023-10-15 – 2023-10-16 (×6): 3 mL via RESPIRATORY_TRACT
  Filled 2023-10-15 (×6): qty 3

## 2023-10-15 MED ORDER — METRONIDAZOLE 500 MG/100ML IV SOLN
500.0000 mg | Freq: Once | INTRAVENOUS | Status: AC
  Administered 2023-10-15: 500 mg via INTRAVENOUS
  Filled 2023-10-15: qty 100

## 2023-10-15 MED ORDER — PANTOPRAZOLE SODIUM 40 MG IV SOLR
40.0000 mg | Freq: Every day | INTRAVENOUS | Status: DC
  Administered 2023-10-15 – 2023-10-16 (×2): 40 mg via INTRAVENOUS
  Filled 2023-10-15 (×2): qty 10

## 2023-10-15 MED ORDER — LATANOPROST 0.005 % OP SOLN
1.0000 [drp] | Freq: Every day | OPHTHALMIC | Status: DC
  Administered 2023-10-15: 1 [drp] via OPHTHALMIC
  Filled 2023-10-15: qty 2.5

## 2023-10-15 MED ORDER — PREGABALIN 100 MG PO CAPS
100.0000 mg | ORAL_CAPSULE | Freq: Once | ORAL | Status: AC
  Administered 2023-10-15: 100 mg via ORAL
  Filled 2023-10-15: qty 1

## 2023-10-15 MED ORDER — OXYCODONE HCL 5 MG PO TABS
10.0000 mg | ORAL_TABLET | Freq: Four times a day (QID) | ORAL | Status: DC | PRN
  Administered 2023-10-15 – 2023-10-16 (×2): 10 mg via ORAL
  Filled 2023-10-15 (×2): qty 2

## 2023-10-15 MED ORDER — APIXABAN 5 MG PO TABS
5.0000 mg | ORAL_TABLET | Freq: Two times a day (BID) | ORAL | Status: DC
Start: 2023-10-15 — End: 2023-10-15

## 2023-10-15 MED ORDER — METHYLPREDNISOLONE SODIUM SUCC 125 MG IJ SOLR
125.0000 mg | Freq: Two times a day (BID) | INTRAMUSCULAR | Status: DC
  Administered 2023-10-15: 125 mg via INTRAVENOUS
  Filled 2023-10-15: qty 2

## 2023-10-15 MED ORDER — VANCOMYCIN HCL 1500 MG/300ML IV SOLN
1500.0000 mg | Freq: Once | INTRAVENOUS | Status: AC
Start: 2023-10-15 — End: 2023-10-15
  Administered 2023-10-15: 1500 mg via INTRAVENOUS
  Filled 2023-10-15: qty 300

## 2023-10-15 MED ORDER — PANTOPRAZOLE SODIUM 40 MG PO TBEC: 40.0000 mg | DELAYED_RELEASE_TABLET | Freq: Every day | ORAL | Status: DC

## 2023-10-15 MED ORDER — IPRATROPIUM-ALBUTEROL 0.5-2.5 (3) MG/3ML IN SOLN
RESPIRATORY_TRACT | Status: AC
  Filled 2023-10-15: qty 3

## 2023-10-15 MED ORDER — CEFEPIME HCL 2 G IV SOLR
2.0000 g | Freq: Three times a day (TID) | INTRAVENOUS | Status: DC
  Administered 2023-10-15 – 2023-10-20 (×16): 2 g via INTRAVENOUS
  Filled 2023-10-15 (×16): qty 12.5

## 2023-10-15 NOTE — ED Triage Notes (Signed)
 PT brought in by EMS for SOB and resp distress PT was on 78% on room air PT arrived on bipap 10/5 settings. PT is alert but combative to the bipap PT HR is elevated to 122. PT states he has COPD. PT unwilling to leave the mask on. PT O2 sats are 97% on bipap.

## 2023-10-15 NOTE — ED Notes (Signed)
 Admitting at bedside

## 2023-10-15 NOTE — Progress Notes (Signed)
 Pharmacy Antibiotic Note  Daniel Whitaker is a 78 y.o. adult admitted on 10/15/2023 with sepsis.  Pharmacy has been consulted for vancomycin  and cefepime  dosing.  Patient Tmax 101.8, kidney function okay (CrCl 70 mL/min), and WBC WNL.   Plan: Vancomycin  1,000 mg IV q12h (eAUC 531 with Scr 1)  Cefepime  2 g IV q8h  Monitor for clinical improvement to de-escalate when possible  Monitor vancomycin  when appropriate   Height: 5\' 10"  (177.8 cm) Weight: 90.7 kg (200 lb) IBW/kg (Calculated) : 73  Temp (24hrs), Avg:100 F (37.8 C), Min:98.1 F (36.7 C), Max:101.8 F (38.8 C)  Recent Labs  Lab 10/14/23 1344 10/15/23 0722 10/15/23 0733  WBC 5.3 9.0  --   CREATININE 0.64 1.00  --   LATICACIDVEN  --   --  1.3    Estimated Creatinine Clearance (by C-G formula based on SCr of 1 mg/dL) Male: 09.8 mL/min Male: 70.1 mL/min    Allergies  Allergen Reactions   Duloxetine  Hcl Nausea Only and Other (See Comments)    Drowsiness, also  Drowsiness, nausea    Drowsiness, also   Atorvastatin Other (See Comments)    Made the hands ACHE   Penicillins Hives    Antimicrobials this admission: Vanc 5/16 >> Cefepime  5/16 >>  Microbiology results: Pending   Thank you for allowing pharmacy to be a part of this patient's care.  Adaline Ada, PharmD PGY1 Pharmacy Resident 10/15/2023 9:29 AM

## 2023-10-15 NOTE — Progress Notes (Signed)
 Pt transported from ED 26 to 2M11 w/o complications

## 2023-10-15 NOTE — Sepsis Progress Note (Signed)
 eLink is following this Code Sepsis.

## 2023-10-15 NOTE — TOC Initial Note (Addendum)
 Transition of Care [facility]) - Initial/Assessment Note    Patient Details  Name: Daniel Whitaker MRN: [id number] Date of Birth: 09/21/1945  [facility]) CM/SW Contact:    Juliane Och, LCSW Phone Number: 10/15/2023, 3:28 PM  Clinical Narrative:                  3:28 PM CSW introduced self and role to patient. Patient's spouse and adult son were also present. Patient consented CSW (via head nod) to speak in front of and with spouse and adult son. Spouse confirmed that her and patient are residing temporarily with their adult daughter. Spouse declined patient's SNF history. Spouse stated that patient has a CPAP and oxygen at home. Spouse confirmed patient's HH history with [facility]. CSW informed bedside RN of SDOH updating needs. Patient did not have SDOH needs as of 08/30/2023.  Expected Discharge Plan: Home/Self Care Barriers to Discharge: Continued Medical Work up   Patient Goals and CMS Choice Patient states their goals for this hospitalization and ongoing recovery are:: to return home          Expected Discharge Plan and Services                                              Prior Living Arrangements/Services   Lives with:: Spouse, Adult Children Patient language and need for interpreter reviewed:: Yes Do you feel safe going back to the place where you live?: Yes      Need for Family Participation in Patient Care: No (Comment) Care giver support system in place?: Yes (comment)   Criminal Activity/Legal Involvement Pertinent to Current Situation/Hospitalization: No - Comment as needed  Activities of Daily Living      Permission Sought/Granted Permission sought to share information with : Family Supports Permission granted to share information with : Yes, Verbal Permission Granted  Share Information with NAME: Daniel Whitaker     Permission granted to share info w Relationship: Spouse  Permission granted to share info w Contact Information:  [phone]  Emotional Assessment Appearance:: Appears stated age Attitude/Demeanor/Rapport: Engaged Affect (typically observed): Accepting, Adaptable, Stable, Appropriate, Calm Orientation: : Oriented to Situation, Oriented to Place, Oriented to Self, Oriented to  Time Alcohol / Substance Use: Not Applicable Psych Involvement: No (comment)  Admission diagnosis:  Respiratory failure (HCC) [J96.90] Respiratory distress [R06.03] Hypoxia [R09.02] Acute respiratory failure with hypoxia (HCC) [J96.01] Multifocal pneumonia [J18.9] Patient Active Problem List   Diagnosis Date Noted   Respiratory failure (HCC) 10/15/2023   Multifocal pneumonia 10/15/2023   Shortness of breath on exertion 10/14/2023   Hypocalcemia 10/07/2023   Weight loss 09/30/2023   Paroxysmal atrial fibrillation (HCC) 08/25/2023   Demand ischemia (HCC) 08/18/2023   Sepsis due to pneumonia (HCC) 08/16/2023   Atrial fibrillation with RVR (HCC) 08/16/2023   Chronic hypoxic respiratory failure (HCC) 08/16/2023   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) 08/16/2023   Poor dentition 08/04/2023   Mild protein-calorie malnutrition (HCC) 07/30/2023   Fluid overload 07/23/2023   Acute on chronic hypoxic respiratory failure (HCC) 07/23/2023   Pressure injury of skin 07/23/2023   URI (upper respiratory infection) 07/23/2023   Reactive airway disease 07/23/2023   Glaucoma 07/23/2023   Pancytopenia, acquired (HCC) 07/09/2023   Decubitus ulcer of buttock, stage 1 07/09/2023   Orthopnea 07/02/2023   Right leg pain 06/18/2023  Acquired hypogammaglobulinemia (HCC) 06/13/2023   Post-nasal drip 06/03/2023   Other constipation 05/21/2023   Cancer related pain 05/21/2023   Hypercalcemia 05/03/2023   AKI (acute kidney injury) (HCC) 05/03/2023   Rib fractures 05/03/2023   Atelectasis 05/03/2023   Unilateral primary osteoarthritis, left knee 06/08/2022   Essential hypertension 05/03/2022   Anemia  of chronic disease 03/27/2022   Restless leg syndrome 01/19/2022   Gait abnormality 01/19/2022   Obstructive sleep apnea 01/19/2022   CAD (coronary artery disease) 10/22/2021   Hyperlipidemia 10/22/2021   Class 1 obesity due to excess calories in adult 03/20/2021   Nocturnal leg cramps 03/11/2021   Lumbar radiculopathy 03/15/2017   Multiple myeloma (HCC) 07/11/2013   Deficiency anemia 07/11/2013   Hypokalemia 04/15/2013   Hyperglycemia 04/15/2013   Neuropathy 04/15/2013   Hereditary and idiopathic peripheral neuropathy 04/06/2013   Cervical spondylosis 12/06/2012   PCP:  Glena Landau, MD Pharmacy:   [facility] - [place], [place] - [address] [place] [address] Phone: [phone] Fax: [phone]  [facility], [place] - [address] [address] [place] [address] Phone: [phone] Fax: [phone]  [place] [facility] [address] [place] [address] Phone: [phone] Fax: [phone]     Social Drivers of Health (SDOH) Social History: SDOH Screenings   Food Insecurity: No Food Insecurity (08/30/2023)  Housing: Low Risk  (08/30/2023)  Transportation Needs: No Transportation Needs (08/30/2023)  Utilities: Not At Risk (08/30/2023)  Recent Concern: Utilities - At Risk (07/23/2023)  Depression (PHQ2-9): Low Risk  (03/12/2022)  Social Connections: Moderately Integrated (08/30/2023)  Tobacco Use: Medium Risk (10/15/2023)   SDOH Interventions:     Readmission Risk Interventions    08/17/2023    8:48 AM 05/07/2023   10:37 AM  Readmission Risk Prevention Plan  Transportation Screening Complete Complete  PCP or Specialist Appt within 5-7 Days  Complete  Home Care Screening  Complete  Medication Review (RN CM)  Complete  Medication Review (RN Care Manager) Complete   PCP or Specialist appointment within 3-5 days of discharge Complete   HRI or  Home Care Consult Complete   SW Recovery Care/Counseling Consult Complete   Palliative Care Screening Not Applicable   Skilled Nursing Facility Not Applicable

## 2023-10-15 NOTE — H&P (Signed)
 NAME:  Daniel Whitaker, MRN:  [id number], DOB:  March 16, 1946, LOS: 0 ADMISSION DATE:  10/15/2023, CONSULTATION DATE: 10/15/2023 REFERRING MD: Emergency department physician, CHIEF COMPLAINT: Impending respiratory failure  History of Present Illness:  78 year old male with extensive past medical history as well-documented below.  He has known sleep apnea and faithfully wears his CPAP nightly.  He presents with increasing respiratory distress had a fever white count was normal but he has a low white count.  She has multiple myeloma for which she received chemotherapy 07/21/2013 2025 and just came off steroids.  He is on BiPAP moderate respiratory distress with admitted to the intensive care unit used cefepime  and vancomycin  prolotherapy Solu-Medrol  monitor electrolytes.  She may require intubation in the near future Long discussion with family was had about this and they want intubation as a last ditch.  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 rate 18, height 5\' 10"  (1.778 m), weight 90.7 kg, SpO2 99%.    FiO2 (%):  [100 %] 100 % PEEP:  [5 cmH20] 5  cmH20 Pressure Support:  [5 cmH20] 5 cmH20  No intake or output data in the 24 hours ending 10/15/23 0826 Filed Weights   10/15/23 0700  Weight: 90.7 kg    Examination: General: Kyphotic male who is in moderate respiratory distress HENT: Kyphoscoliosis no JVD is appreciated Lungs: Decreased breath sounds throughout Cardiovascular: Heart sounds are distant, no recent Port-A-Cath Abdomen: Positive bowel sounds Extremities: 2-3+ edema Neuro: Arouses follow commands GU: Amber urine  Resolved problem list   Assessment and Plan  Acute on chronic respiratory failure in the setting of OSA, kyphoscoliosis suspected pneumonia.  He is currently BiPAP dependent and may require intubation in the near future. Admitted to the intensive care unit Antibiotics consisting of vancomycin  and Maxipime  Bronchodilators Noninvasive mechanical ventilatory support for now but there is a concern for him wearing down requiring intubation.  Suspected sepsis Will check procalcitonin Moderate fluids due to his volume overload  History of multiple myeloma with chemotherapy 10/14/2023 Oncology aware of his admission   Hypercalcemia Monitor electrolytes Chronic anemia Recent Labs    10/15/23 0722 10/15/23 0733  HGB 10.0* 9.9*   Monitor  Best Practice (right click and "Reselect all SmartList Selections" daily)   Diet/type: NPO DVT prophylaxis DOAC Pressure ulcer(s): N/A GI prophylaxis: PPI Lines: Central line Foley:  Yes, and it is still needed Code Status:  full code Last date of multidisciplinary goals of care discussion [TBD]  Labs   CBC: Recent Labs  Lab 10/14/23 1344 10/15/23 0722 10/15/23 0733  WBC 5.3 9.0  --  NEUTROABS 3.8 7.7  --   HGB 9.1* 10.0* 9.9*  HCT 27.9* 30.9* 29.0*  MCV 83.5 85.8  --   PLT 234 261  --     Basic Metabolic Panel: Recent Labs  Lab 10/14/23 1344 10/15/23 0722 10/15/23 0733  NA 139 139 140  K 3.5 3.5 3.6  CL 106 109  --   CO2 29 22  --    GLUCOSE 119* 147*  --   BUN 19 23  --   CREATININE 0.64 1.00  --   CALCIUM  8.7* 8.5*  --    GFR: Estimated Creatinine Clearance (by C-G formula based on SCr of 1 mg/dL) Male: 40.9 mL/min Male: 70.1 mL/min Recent Labs  Lab 10/14/23 1344 10/15/23 0722 10/15/23 0733  WBC 5.3 9.0  --   LATICACIDVEN  --   --  1.3    Liver Function Tests: Recent Labs  Lab 10/14/23 1344 10/15/23 0722  AST 7* 15  ALT 6 11  ALKPHOS 162* 150*  BILITOT 0.4 0.4  PROT 5.7* 5.5*  ALBUMIN  3.4* 2.7*   No results for input(s): "LIPASE", "AMYLASE" in the last 168 hours. No results for input(s): "AMMONIA" in the last 168 hours.  ABG    Component Value Date/Time   HCO3 21.6 10/15/2023 0733   TCO2 23 10/15/2023 0733   ACIDBASEDEF 3.0 (H) 10/15/2023 0733   O2SAT 99 10/15/2023 0733     Coagulation Profile: Recent Labs  Lab 10/15/23 0722  INR 1.5*    Cardiac Enzymes: No results for input(s): "CKTOTAL", "CKMB", "CKMBINDEX", "TROPONINI" in the last 168 hours.  HbA1C: Hgb A1c MFr Bld  Date/Time Value Ref Range Status  03/20/2022 08:03 AM 5.1 4.8 - 5.6 % Final    Comment:    (NOTE) Pre diabetes:          5.7%-6.4%  Diabetes:              >6.4%  Glycemic control for   <7.0% adults with diabetes   04/15/2013 02:43 PM 5.2 <5.7 % Final    Comment:    (NOTE)                                                                       According to the ADA Clinical Practice Recommendations for 2011, when HbA1c is used as a screening test:  >=6.5%   Diagnostic of Diabetes Mellitus           (if abnormal result is confirmed) 5.7-6.4%   Increased risk of developing Diabetes Mellitus References:Diagnosis and Classification of Diabetes Mellitus,Diabetes Care,2011,34(Suppl 1):S62-S69 and Standards of Medical Care in         Diabetes - 2011,Diabetes Care,2011,34 (Suppl 1):S11-S61.    CBG: No results for input(s): "GLUCAP" in the last 168 hours.  Review of Systems:   na  Past Medical History:   He,  has a past medical history of Arthritis, Cataract, Coronary artery disease, Erectile dysfunction, Esophageal reflux, Glaucoma, Hypertriglyceridemia, Impaired fasting glucose, MGUS (monoclonal gammopathy of unknown significance), Neuropathy associated with MGUS (HCC) (07/11/2013), Nocturnal leg cramps (03/11/2021), Nontoxic uninodular goiter, Obesity, Peripheral neuropathy, RLS (restless legs syndrome), Spinal stenosis, and Unspecified deficiency anemia (07/11/2013).   Surgical History:   Past Surgical History:  Procedure Laterality Date  CARDIAC CATHETERIZATION     COLON RESECTION N/A 04/21/2013   Procedure: DIAGNOSTIC LAPAROSCOPY, lysis of adhesions for partial small bowel obstruction;  Surgeon: Harlee Lichtenstein, MD;  Location: [facility] ORS;  Service: General;  Laterality: N/A;   COLON SURGERY     partial SBO    COLONOSCOPY     CORONARY STENT INTERVENTION N/A 10/22/2021   Procedure: CORONARY STENT INTERVENTION;  Surgeon: Arleen Lacer, MD;  [facility] IN[facility]AS[facility] CV [facility];  Service: Cardiovascular;  Laterality: N/A;   IR IMAGING GUIDED PORT INSERTION  09/02/2023   KNEE SURGERY     Arthroscopic   LEFT HEART CATH AND CORONARY ANGIOGRAPHY N/A 10/22/2021   Procedure: LEFT HEART CATH AND CORONARY ANGIOGRAPHY;  Surgeon: Arleen Lacer, MD;  Location: [facility] INVASIVE CV LAB;  Service: Cardiovascular;  Laterality: N/A;   SMALL INTESTINE SURGERY     Blockage     Social History:   reports that he quit smoking about 39 years ago. His smoking use included cigarettes. He has never used smokeless tobacco. He reports that he does not currently use alcohol after a past usage of about 10.0 standard drinks of alcohol per week. He reports that he does not use drugs.   Family History:  His family history includes Brain cancer in his mother; Dementia in his brother; Heart disease in his brother; Lung cancer in his father. There is no history of Neuropathy, Colon cancer, Colon polyps, Esophageal cancer, Rectal  cancer, Stomach cancer, or Sleep apnea.   Allergies Allergies  Allergen Reactions   Duloxetine  Hcl Nausea Only and Other (See Comments)    Drowsiness, also  Drowsiness, nausea    Drowsiness, also   Atorvastatin Other (See Comments)    Made the hands ACHE   Penicillins Hives     Home Medications  Prior to Admission medications   Medication Sig Start Date End Date Taking? Authorizing Provider  acyclovir  (ZOVIRAX ) 400 MG tablet Take 1 tablet (400 mg total) by mouth daily. 06/09/23   [name], MD  albuterol  (VENTOLIN  HFA) 108 (90 Base) MCG/ACT inhaler Inhale 1-2 puffs into the lungs every 6 (six) hours as needed for wheezing or shortness of breath.    [provider]  ALPRAZolam  (XANAX ) 0.25 MG tablet Take 1 tablet (0.25 mg total) by mouth 2 (two) times daily as needed for anxiety. 09/30/23   [name], MD  apixaban  (ELIQUIS ) 5 MG TABS tablet Take 1 tablet (5 mg total) by mouth 2 (two) times daily. 08/20/23   Barbee Lew, MD  brimonidine  (ALPHAGAN ) 0.2 % ophthalmic solution Place 1 drop into the left eye in the morning and at bedtime. 01/08/20   [provider]  cetirizine (ZYRTEC) 10 MG tablet Take 10 mg by mouth every morning.    [provider]  DENTA 5000 PLUS 1.1 % CREA dental cream Place 1 Application onto teeth at bedtime. 06/08/23   [provider]  dexamethasone  (DECADRON ) 4 MG tablet Take 2 mg twice a week 09/30/23   [name], MD  docusate sodium  (COLACE) 100 MG capsule Take 300 mg by mouth daily.    [provider]  dorzolamide -timolol  (COSOPT ) 22.3-6.8 MG/ML ophthalmic solution Place 1 drop into both eyes 2 (two) times daily. 03/09/18   [provider]  furosemide  (LASIX ) 40 MG tablet Take 1 tablet (40 mg total) by mouth daily. 09/30/23   [name], MD  ketoconazole (NIZORAL) 2 % cream Apply 1 application  topically daily as needed for irritation. 11/28/14  [provider]  latanoprost  (XALATAN ) 0.005 %  ophthalmic solution Place 1 drop into both eyes at bedtime. 03/01/13   [provider]  lidocaine  (XYLOCAINE ) 5 % ointment Apply 1 Application topically 2 (two) times daily as needed. 09/29/22   Millikan, Megan, NP  lidocaine -prilocaine  (EMLA ) cream Apply to affected area once 09/01/23   [name], MD  morphine  (MS CONTIN ) 15 MG 12 hr tablet Take 1 tablet (15 mg total) by mouth at bedtime. 08/25/23   [name], MD  Multiple Vitamins-Minerals (CENTRUM SILVER 50+MEN) TABS Take 1 tablet by mouth daily with breakfast.    [provider]  nitroGLYCERIN  (NITROSTAT ) 0.4 MG SL tablet Place 1 tablet (0.4 mg total) under the tongue every 5 (five) minutes as needed. 10/22/21   Sanjuanita Cruz, NP  ofloxacin (FLOXIN) 0.3 % OTIC solution Place 4 drops into both ears 2 (two) times daily for 7 days. 10/11/23 10/18/23  Evelina Hippo, MD  ondansetron  (ZOFRAN ) 8 MG tablet Take 1 tablet (8 mg total) by mouth every 8 (eight) hours as needed for nausea or vomiting. 05/11/23   [name], MD  oxyCODONE  (ROXICODONE ) 15 MG immediate release tablet Take 1 tablet (15 mg total) by mouth every 4 (four) hours as needed for severe pain (pain score 7-10). 09/07/23   [name], MD  pantoprazole  (PROTONIX ) 40 MG tablet Take 40 mg by mouth daily before breakfast. 03/14/13   [provider]  polyethylene glycol (MIRALAX  / GLYCOLAX ) 17 g packet Take 17 g by mouth 2 (two) times daily. 07/30/23   [name], MD  pregabalin  (LYRICA ) 100 MG capsule TAKE 1 CAPSULE BY MOUTH IN THE MORNING, AND AT NOON, AND 2 CAPSULES AT NIGHT 10/05/23   Millikan, Megan, NP  prochlorperazine  (COMPAZINE ) 10 MG tablet Take 1 tablet (10 mg total) by mouth every 6 (six) hours as needed for nausea or vomiting. 05/11/23   [name], MD  rosuvastatin  (CRESTOR ) 40 MG tablet Take 40 mg by mouth in the morning and at bedtime.    [provider]  senna-docusate (SENOKOT-S) 8.6-50 MG tablet Take 2 tablets by mouth 2 (two) times  daily. Patient taking differently: Take 2 tablets by mouth 2 (two) times daily as needed for moderate constipation. 07/30/23   [name], MD  SSD 1 % cream Apply 1 Application topically daily. 08/09/23   [provider]  triamcinolone  (NASACORT ) 55 MCG/ACT AERO nasal inhaler Place 1 spray into the nose daily as needed. 09/03/23   Audria Leather, MD  TYLENOL  500 MG tablet Take 1,000 mg by mouth every 6 (six) hours as needed for mild pain (pain score 1-3) or headache.    [provider]     Critical care time: 45 min    Siegfried Dress Meshia Rau ACNP Acute Care Nurse Practitioner [facility] Pulmonary/Critical Care Please consult Amion 10/15/2023, 8:26 AM

## 2023-10-15 NOTE — ED Provider Notes (Signed)
 [place] EMERGENCY DEPARTMENT AT [facility] Provider Note   CSN: [id number] Arrival date & time: 10/15/23  4098     History  Chief Complaint  Patient presents with   Respiratory Distress    Daniel Whitaker is a 78 y.o. adult.  The history is provided by the EMS personnel, medical records and a relative. The history is limited by the condition of the patient.  Shortness of Breath Severity:  Severe Onset quality:  Gradual Duration:  1 day Timing:  Constant Progression:  Worsening Chronicity:  Recurrent Context: URI (cough)   Relieved by: bipap. Worsened by:  Nothing Ineffective treatments:  None tried Associated symptoms: cough and fever   Associated symptoms: no abdominal pain, no chest pain, no vomiting and no wheezing   Risk factors: hx of cancer   Risk factors: no hx of PE/DVT        Home Medications Prior to Admission medications   Medication Sig Start Date End Date Taking? Authorizing Provider  acyclovir  (ZOVIRAX ) 400 MG tablet Take 1 tablet (400 mg total) by mouth daily. 06/09/23   [name], MD  albuterol  (VENTOLIN  HFA) 108 (90 Base) MCG/ACT inhaler Inhale 1-2 puffs into the lungs every 6 (six) hours as needed for wheezing or shortness of breath.    [provider]  ALPRAZolam  (XANAX ) 0.25 MG tablet Take 1 tablet (0.25 mg total) by mouth 2 (two) times daily as needed for anxiety. 09/30/23   [name], MD  apixaban  (ELIQUIS ) 5 MG TABS tablet Take 1 tablet (5 mg total) by mouth 2 (two) times daily. 08/20/23   Barbee Lew, MD  brimonidine  (ALPHAGAN ) 0.2 % ophthalmic solution Place 1 drop into the left eye in the morning and at bedtime. 01/08/20   [provider]  cetirizine (ZYRTEC) 10 MG tablet Take 10 mg by mouth every morning.    [provider]  DENTA 5000 PLUS 1.1 % CREA dental cream Place 1 Application onto teeth at bedtime. 06/08/23   [provider]  dexamethasone  (DECADRON ) 4 MG tablet Take 2 mg twice a  week 09/30/23   [name], MD  docusate sodium  (COLACE) 100 MG capsule Take 300 mg by mouth daily.    [provider]  dorzolamide -timolol  (COSOPT ) 22.3-6.8 MG/ML ophthalmic solution Place 1 drop into both eyes 2 (two) times daily. 03/09/18   [provider]  furosemide  (LASIX ) 40 MG tablet Take 1 tablet (40 mg total) by mouth daily. 09/30/23   [name], MD  ketoconazole (NIZORAL) 2 % cream Apply 1 application  topically daily as needed for irritation. 11/28/14   [provider]  latanoprost  (XALATAN ) 0.005 % ophthalmic solution Place 1 drop into both eyes at bedtime. 03/01/13   [provider]  lidocaine  (XYLOCAINE ) 5 % ointment Apply 1 Application topically 2 (two) times daily as needed. 09/29/22   Millikan, Megan, NP  lidocaine -prilocaine  (EMLA ) cream Apply to affected area once 09/01/23   [name], MD  morphine  (MS CONTIN ) 15 MG 12 hr tablet Take 1 tablet (15 mg total) by mouth at bedtime. 08/25/23   [name], MD  Multiple Vitamins-Minerals (CENTRUM SILVER 50+MEN) TABS Take 1 tablet by mouth daily with breakfast.    [provider]  nitroGLYCERIN  (NITROSTAT ) 0.4 MG SL tablet Place 1 tablet (0.4 mg total) under the tongue every 5 (five) minutes as needed. 10/22/21   Sanjuanita Cruz, NP  ofloxacin (FLOXIN) 0.3 % OTIC solution Place 4 drops into both ears 2 (two) times  daily for 7 days. 10/11/23 10/18/23  Evelina Hippo, MD  ondansetron  (ZOFRAN ) 8 MG tablet Take 1 tablet (8 mg total) by mouth every 8 (eight) hours as needed for nausea or vomiting. 05/11/23   [name], MD  oxyCODONE  (ROXICODONE ) 15 MG immediate release tablet Take 1 tablet (15 mg total) by mouth every 4 (four) hours as needed for severe pain (pain score 7-10). 09/07/23   [name], MD  pantoprazole  (PROTONIX ) 40 MG tablet Take 40 mg by mouth daily before breakfast. 03/14/13   [provider]  polyethylene glycol (MIRALAX  / GLYCOLAX ) 17 g packet Take 17 g by mouth 2 (two) times  daily. 07/30/23   [name], MD  pregabalin  (LYRICA ) 100 MG capsule TAKE 1 CAPSULE BY MOUTH IN THE MORNING, AND AT NOON, AND 2 CAPSULES AT NIGHT 10/05/23   Millikan, Megan, NP  prochlorperazine  (COMPAZINE ) 10 MG tablet Take 1 tablet (10 mg total) by mouth every 6 (six) hours as needed for nausea or vomiting. 05/11/23   [name], MD  rosuvastatin  (CRESTOR ) 40 MG tablet Take 40 mg by mouth in the morning and at bedtime.    [provider]  senna-docusate (SENOKOT-S) 8.6-50 MG tablet Take 2 tablets by mouth 2 (two) times daily. Patient taking differently: Take 2 tablets by mouth 2 (two) times daily as needed for moderate constipation. 07/30/23   [name], MD  SSD 1 % cream Apply 1 Application topically daily. 08/09/23   [provider]  triamcinolone  (NASACORT ) 55 MCG/ACT AERO nasal inhaler Place 1 spray into the nose daily as needed. 09/03/23   Audria Leather, MD  TYLENOL  500 MG tablet Take 1,000 mg by mouth every 6 (six) hours as needed for mild pain (pain score 1-3) or headache.    [provider]      Allergies    Duloxetine  hcl, Atorvastatin, and Penicillins    Review of Systems   Review of Systems  Constitutional:  Positive for chills and fever. Negative for fatigue.  HENT:  Negative for congestion.   Respiratory:  Positive for cough, chest tightness and shortness of breath. Negative for wheezing.   Cardiovascular:  Positive for leg swelling (swollen but not as bad as they have been in the past). Negative for chest pain and palpitations.  Gastrointestinal:  Negative for abdominal pain, constipation, diarrhea, nausea and vomiting.  Genitourinary:  Negative for dysuria.  Musculoskeletal:  Negative for back pain.  Psychiatric/Behavioral:  Positive for agitation (reduced now with valium).   All other systems reviewed and are negative.   Physical Exam Updated Vital Signs BP (!) 113/99   Pulse (!) 122   Temp 100 F (37.8 C) (Axillary)   Resp (!) 26   Ht 5'  10" (1.778 m)   Wt 90.7 kg   SpO2 96%   BMI 28.70 kg/m  Physical Exam Vitals and nursing note reviewed.  Constitutional:      General: He is in acute distress.     Appearance: He is well-developed. He is ill-appearing. He is not diaphoretic.  HENT:     Head: Normocephalic and atraumatic.     Right Ear: External ear normal.     Left Ear: External ear normal.  Eyes:     Conjunctiva/sclera: Conjunctivae normal.     Pupils: Pupils are equal, round, and reactive to light.  Cardiovascular:     Rate and Rhythm: Tachycardia present.     Heart sounds: No murmur heard. Pulmonary:     Effort: Respiratory distress  present.     Breath sounds: Rhonchi and rales present.  Chest:     Chest wall: No tenderness.  Abdominal:     General: There is no distension.     Tenderness: There is no abdominal tenderness. There is no guarding or rebound.  Musculoskeletal:        General: No tenderness.     Cervical back: Normal range of motion and neck supple. No tenderness.     Right lower leg: Edema present.     Left lower leg: Edema present.  Skin:    General: Skin is warm.     Findings: No erythema or rash.  Neurological:     General: No focal deficit present.     Mental Status: He is alert.     Motor: No abnormal muscle tone.     Deep Tendon Reflexes: Reflexes are normal and symmetric.     ED Results / Procedures / Treatments   Labs (all labs ordered are listed, but only abnormal results are displayed) Labs Reviewed  COMPREHENSIVE METABOLIC PANEL WITH GFR - Abnormal; Notable for the following components:      Result Value   Glucose, Bld 147 (*)    Calcium  8.5 (*)    Total Protein 5.5 (*)    Albumin  2.7 (*)    Alkaline Phosphatase 150 (*)    All other components within normal limits  CBC WITH DIFFERENTIAL/PLATELET - Abnormal; Notable for the following components:   RBC 3.60 (*)    Hemoglobin 10.0 (*)    HCT 30.9 (*)    RDW 17.1 (*)    Lymphs Abs 0.5 (*)    All other components  within normal limits  BRAIN NATRIURETIC PEPTIDE - Abnormal; Notable for the following components:   B Natriuretic Peptide 100.5 (*)    All other components within normal limits  PROTIME-INR - Abnormal; Notable for the following components:   Prothrombin Time 18.0 (*)    INR 1.5 (*)    All other components within normal limits  I-STAT VENOUS BLOOD GAS, ED - Abnormal; Notable for the following components:   pCO2, Ven 37.1 (*)    pO2, Ven 147 (*)    Acid-base deficit 3.0 (*)    HCT 29.0 (*)    Hemoglobin 9.9 (*)    All other components within normal limits  I-STAT ARTERIAL BLOOD GAS, ED - Abnormal; Notable for the following components:   pH, Arterial 7.346 (*)    pO2, Arterial 194 (*)    Acid-base deficit 3.0 (*)    Potassium 3.3 (*)    HCT 26.0 (*)    Hemoglobin 8.8 (*)    All other components within normal limits  RESP PANEL BY RT-PCR (RSV, FLU A&B, COVID)  RVPGX2  CULTURE, BLOOD (ROUTINE X 2)  CULTURE, BLOOD (ROUTINE X 2)  MRSA NEXT GEN BY PCR, NASAL  CULTURE, RESPIRATORY W GRAM STAIN  URINALYSIS, W/ REFLEX TO CULTURE (INFECTION SUSPECTED)  COMPREHENSIVE METABOLIC PANEL WITH GFR  MAGNESIUM   PHOSPHORUS  CORTISOL  CBC  BLOOD GAS, ARTERIAL  HEMOGLOBIN A1C  I-STAT CHEM 8, ED  I-STAT CG4 LACTIC ACID, ED  I-STAT CG4 LACTIC ACID, ED  I-STAT CG4 LACTIC ACID, ED  TROPONIN I (HIGH SENSITIVITY)  TROPONIN I (HIGH SENSITIVITY)    EKG None  Radiology DG Chest Port 1 View Result Date: 10/15/2023 CLINICAL DATA:  Shortness of breath. EXAM: PORTABLE CHEST 1 VIEW COMPARISON:  08/29/2023 FINDINGS: Rotated film with patient's face obscuring the left apex. The cardio  pericardial silhouette is enlarged. Interstitial markings are diffusely coarsened with chronic features. Patchy and confluent airspace disease is seen in both upper lungs suggesting pneumonia. Bones are diffusely demineralized with numerous bilateral rib fractures. Right Port-A-Cath again noted. Telemetry leads overlie the  chest. IMPRESSION: 1. Patchy and confluent airspace disease in both upper lungs suggesting multifocal pneumonia. 2. Chronic interstitial coarsening. Electronically Signed   By: Donnal Fusi M.D.   On: 10/15/2023 07:25    Procedures Procedures    CRITICAL CARE Performed by: Marine Sia Miko Sirico Total critical care time: 40 minutes Critical care time was exclusive of separately billable procedures and treating other patients. Critical care was necessary to treat or prevent imminent or life-threatening deterioration. Critical care was time spent personally by me on the following activities: development of treatment plan with patient and/or surrogate as well as nursing, discussions with consultants, evaluation of patient's response to treatment, examination of patient, obtaining history from patient or surrogate, ordering and performing treatments and interventions, ordering and review of laboratory studies, ordering and review of radiographic studies, pulse oximetry and re-evaluation of patient's condition.  Medications Ordered in ED Medications  lactated ringers  infusion (has no administration in time range)  aztreonam (AZACTAM) 2 g in sodium chloride  0.9 % 100 mL IVPB (has no administration in time range)  metroNIDAZOLE  (FLAGYL ) IVPB 500 mg (has no administration in time range)  vancomycin  (VANCOREADY) IVPB 1500 mg/300 mL (has no administration in time range)  diazepam (VALIUM) injection 2.5 mg (2.5 mg Intravenous Given 10/15/23 0703)    ED Course/ Medical Decision Making/ A&P                                 Medical Decision Making Amount and/or Complexity of Data Reviewed Labs: ordered.  Risk Prescription drug management. Decision regarding hospitalization.    VLADISLAV WETZELL is a 78 y.o. adult with a past medical history significant for multiple myeloma, atrial fibrillation on Eliquis  therapy, CAD with PCI, neuropathies, obesity, and previous hypoxic respiratory failure  related to pneumonia who presents for respiratory failure.  According to EMS report to nursing, patient was having worsened breathing overnight and was found to have oxygen saturations in the 70s on room air.  Patient does not take oxygen at home normally by report.  Patient was found to be warm to the touch, was tachycardic and tachypneic.  Chart review shows the patient had sepsis with pneumonia several months ago.  Patient denied that he was having some coughing and arrives on BiPAP.  Patient initially was pulling at his mask and pulling his BiPAP off making himself hypoxic and was given some Valium on arrival here to help relax him to tolerate BiPAP.  He is now seem to tolerate it better.  He did not seem to indicate that he was having any active pain at this time but he is ill-appearing.  On my exam, he has coarse breath sounds bilaterally with diffuse rales.  He is very edematous peripherally in his legs.  Chest and abdomen did not seem tender on exam.  He is tachycardic, tachypneic, and temperatures 100 axillary.  Oxygen saturation is now in the mid 90s on BiPAP.  Blood pressure is around 115 systolic.  Clinically I am concerned about sepsis given the patient's vital signs and exam.  Will remain on BiPAP.  Patient is somewhat sleepy and I am concerned he is tired from breathing so hard.  The Valium  seems to be helping him tolerate the BiPAP however given his ill appearance we will consult critical care for evaluation as a suspect he may need ICU.  He is still following commands with hands and feet and is acknowledging with some voice through BiPAP so do not feel he needs intubation at this time.  Although we are activating code sepsis, he seems very fluid overloaded in his legs and lungs on exam so we will hold on significant fluid administration as I am concerned this may lead to flooding his lungs and to intubation.  Will give antibiotics and get screening labs and x-rays.  He will be admitted for  further management.  X-ray does show evidence of pneumonia.  Critical care will admit for further management.  Negative for COVID/flu/RSV.         Final Clinical Impression(s) / ED Diagnoses Final diagnoses:  Respiratory distress  Hypoxia  Acute respiratory failure with hypoxia (HCC)  Multifocal pneumonia   Clinical Impression: 1. Respiratory distress   2. Hypoxia   3. Acute respiratory failure with hypoxia (HCC)   4. Multifocal pneumonia     Disposition: Admit  This note was prepared with assistance of Dragon voice recognition software. Occasional wrong-word or sound-a-like substitutions may have occurred due to the inherent limitations of voice recognition software.        Addley Ballinger, Marine Sia, MD 10/15/23 [id number]

## 2023-10-15 NOTE — Progress Notes (Signed)
 Daniel Whitaker   DOB:01/23/1946   WU#:[id number]    ASSESSMENT & PLAN:  Multiple myeloma not having achieved remission (HCC) He has IgA kappa multiple myeloma with multiple bone fractures at presentation in 2024.  Bone marrow biopsy showed 15% involvement, standard risk myeloma FISH and cytogenetics   He ha initially good partial response with combination chemotherapy with daratumumab , lenalidomide , bortezomib  and dexamethasone  but then started to have progression of disease Treatment was switched to combination treatment with carfilzomib , cyclophosphamide  and dexamethasone  Recent myeloma panel showed positive response to therapy   Chemotherapy is on hold today due to admission Next dose of chemotherapy on May 29 Continue supportive care   Fever, acute respiratory failure Currently admitted for possible healthcare acquired pneumonia Received broad-spectrum IV antibiotics, oxygen, nebulizers and steroids Appreciate assistance from ICU team  Mild protein-calorie malnutrition (HCC) Since recent steroid withdrawal, he has lost weight He is noted to have progressive mild protein calorie malnutrition We discussed importance of increased protein in his diet  Cancer related pain He has good pain control He will continue oxycodone  taper  Slight worsening anemia Question hemodilution Observe closely  Discharge planning Will defer to ICU team Will check on him next week  Daniel Jacobs, MD 10/15/2023 3:11 PM  Subjective: Was requested by his family to visit him today.  The patient was admitted to the emergency department due to severe respiratory failure with impeding need for intubation The patient was transferred to the ICU today Patient is feeling better, currently on high flow nasal cannula.  His son, Daniel Whitaker, is by the bedside His rib pain is much better.  The patient is receiving IV antibiotics, nebulizer treatment as well as high-dose steroids His chemotherapy is canceled  today  Objective:  Vitals:   10/15/23 1135 10/15/23 1500  BP:    Pulse: 89   Resp: (!) 22   Temp:  99.2 F (37.3 C)  SpO2: 93%      Intake/Output Summary (Last 24 hours) at 10/15/2023 1511 Last data filed at 10/15/2023 1200 Gross per 24 hour  Intake 683.94 ml  Output --  Net 683.94 ml   He appears alert and oriented.  Breathing better.  No acute respiratory distress.  Smiling and laughing today

## 2023-10-15 NOTE — Progress Notes (Signed)
 eLink Physician-Brief Progress Note Patient Name: Daniel Whitaker DOB: 12/14/1945 MRN: [id number]   Date of Service  10/15/2023  HPI/Events of Note  Patient asking for home eye drops to be continued.  eICU Interventions  Home eye drops ordered.        Sandip Power U Astor Gentle 10/15/2023, 8:28 PM

## 2023-10-15 NOTE — Progress Notes (Signed)
 Patient is currently in the [facility] emergency department due to respiratory failure I spoke with his grandson, Ambrosio Junker and I reviewed plan of care with him Ambrosio Junker has concern about his care and whether the family should proceed with decision for intubation  I assured him that the patient is getting the best care right now; if he has worsening respiratory failure, to keep him alive, he will need to be intubated Due to his altered mental status, his family members are making decision for him and they would like him to be full code at this point I will stop by to check on him after my clinic is over at the [facility]  I have addressed all his questions

## 2023-10-16 ENCOUNTER — Inpatient Hospital Stay (HOSPITAL_COMMUNITY)

## 2023-10-16 DIAGNOSIS — E46 Unspecified protein-calorie malnutrition: Secondary | ICD-10-CM

## 2023-10-16 LAB — COMPREHENSIVE METABOLIC PANEL WITH GFR
ALT: 10 U/L (ref 0–44)
AST: 11 U/L — ABNORMAL LOW (ref 15–41)
Albumin: 2.4 g/dL — ABNORMAL LOW (ref 3.5–5.0)
Alkaline Phosphatase: 121 U/L (ref 38–126)
Anion gap: 8 (ref 5–15)
BUN: 31 mg/dL — ABNORMAL HIGH (ref 8–23)
CO2: 23 mmol/L (ref 22–32)
Calcium: 8 mg/dL — ABNORMAL LOW (ref 8.9–10.3)
Chloride: 108 mmol/L (ref 98–111)
Creatinine, Ser: 0.88 mg/dL (ref 0.61–1.24)
GFR, Estimated: >60 mL/min (ref >60)
Glucose, Bld: 144 mg/dL — ABNORMAL HIGH (ref 70–99)
Potassium: 4.2 mmol/L (ref 3.5–5.1)
Sodium: 139 mmol/L (ref 135–145)
Total Bilirubin: 0.4 mg/dL (ref 0.0–1.2)
Total Protein: 4.7 g/dL — ABNORMAL LOW (ref 6.5–8.1)

## 2023-10-16 LAB — CBC
HCT: 26 % — ABNORMAL LOW (ref 39.0–52.0)
Hemoglobin: 8.4 g/dL — ABNORMAL LOW (ref 13.0–17.0)
MCH: 27.7 pg (ref 26.0–34.0)
MCHC: 32.3 g/dL (ref 30.0–36.0)
MCV: 85.8 fL (ref 80.0–100.0)
Platelets: 191 10*3/uL (ref 150–400)
RBC: 3.03 MIL/uL — ABNORMAL LOW (ref 4.22–5.81)
RDW: 17.2 % — ABNORMAL HIGH (ref 11.5–15.5)
WBC: 6 10*3/uL (ref 4.0–10.5)
nRBC: 0 % (ref 0.0–0.2)

## 2023-10-16 LAB — PROCALCITONIN: Procalcitonin: 6.85 ng/mL

## 2023-10-16 LAB — TROPONIN I (HIGH SENSITIVITY): Troponin I (High Sensitivity): 17 ng/L (ref <18)

## 2023-10-16 LAB — MAGNESIUM: Magnesium: 1.9 mg/dL (ref 1.7–2.4)

## 2023-10-16 MED ORDER — GUAIFENESIN ER 600 MG PO TB12
1200.0000 mg | ORAL_TABLET | Freq: Two times a day (BID) | ORAL | Status: AC
  Administered 2023-10-16 – 2023-10-18 (×4): 1200 mg via ORAL
  Filled 2023-10-16 (×4): qty 2

## 2023-10-16 MED ORDER — LATANOPROST 0.005 % OP SOLN
1.0000 [drp] | Freq: Every day | OPHTHALMIC | Status: DC
  Administered 2023-10-16 – 2023-10-19 (×4): 1 [drp] via OPHTHALMIC
  Filled 2023-10-16: qty 2.5

## 2023-10-16 MED ORDER — MAGNESIUM SULFATE 2 GM/50ML IV SOLN
2.0000 g | Freq: Once | INTRAVENOUS | Status: AC
  Administered 2023-10-16: 2 g via INTRAVENOUS
  Filled 2023-10-16: qty 50

## 2023-10-16 MED ORDER — PANTOPRAZOLE SODIUM 40 MG PO TBEC
40.0000 mg | DELAYED_RELEASE_TABLET | Freq: Every day | ORAL | Status: DC
  Administered 2023-10-17 – 2023-10-20 (×4): 40 mg via ORAL
  Filled 2023-10-16 (×4): qty 1

## 2023-10-16 MED ORDER — OXYCODONE HCL 5 MG PO TABS
5.0000 mg | ORAL_TABLET | ORAL | Status: AC | PRN
  Administered 2023-10-16 – 2023-10-18 (×10): 10 mg via ORAL
  Filled 2023-10-16 (×10): qty 2

## 2023-10-16 MED ORDER — ACETAMINOPHEN 325 MG PO TABS
650.0000 mg | ORAL_TABLET | ORAL | Status: AC | PRN
  Administered 2023-10-16 – 2023-10-18 (×8): 650 mg via ORAL
  Filled 2023-10-16 (×10): qty 2

## 2023-10-16 NOTE — Progress Notes (Addendum)
 PCCM note  Patient has increased cough, congestion, mildly increased work of breathing.  On 4 L high flow nasal cannula Bilateral rhonchi appreciated  Start incentive spirometer, flutter valve, chest PT, mucinex and nebs for mucociliary clearance Will keep him physically in the ICU overnight.  Still okay to go to hospitalist service tomorrow  Phyllis Breeze MD [facility] See Amion for pager  If no response to pager , please call [phone] until 7pm After 7:00 pm call Elink  [phone] 10/16/2023, 5:08 PM

## 2023-10-16 NOTE — Plan of Care (Signed)
   Problem: Coping: Goal: Ability to adjust to condition or change in health will improve Outcome: Progressing   Problem: Health Behavior/Discharge Planning: Goal: Ability to manage health-related needs will improve Outcome: Progressing

## 2023-10-16 NOTE — Progress Notes (Signed)
 [facility] ADULT ICU REPLACEMENT PROTOCOL   The patient does apply for the [facility] Adult ICU Electrolyte Replacment Protocol based on the criteria listed below:   1.Exclusion criteria: TCTS, ECMO, Dialysis, and Myasthenia Gravis patients 2. Is GFR >/= 30 ml/min? Yes.    Patient's GFR today is >60 3. Is SCr </= 2? Yes.   Patient's SCr is 0.88 mg/dL 4. Did SCr increase >/= 0.5 in 24 hours? No. 5.Pt's weight >40kg  Yes.   6. Abnormal electrolyte(s): Mg = 1.9  7. Electrolytes replaced per protocol 8.  Call MD STAT for K+ </= 2.5, Phos </= 1, or Mag </= 1 Physician:  Iran Manna, eMD   Alison Applebaum Kamryn Messineo 10/16/2023 3:50 AM

## 2023-10-16 NOTE — Plan of Care (Signed)
  Problem: Nutritional: Goal: Maintenance of adequate nutrition will improve Outcome: Progressing   Problem: Metabolic: Goal: Ability to maintain appropriate glucose levels will improve Outcome: Progressing   Problem: Nutritional: Goal: Progress toward achieving an optimal weight will improve Outcome: Progressing

## 2023-10-16 NOTE — Progress Notes (Addendum)
 NAME:  Daniel Whitaker, MRN:  [id number], DOB:  1945/12/26, LOS: 1 ADMISSION DATE:  10/15/2023, CONSULTATION DATE: 10/15/2023 REFERRING MD: Emergency department physician, CHIEF COMPLAINT: Impending respiratory failure  History of Present Illness:  78 year old male with extensive past medical history as well-documented below.  He has known sleep apnea and faithfully wears his CPAP nightly.  He presents with increasing respiratory distress had a fever white count was normal but he has a low white count.  he has multiple myeloma for which she received chemotherapy 07/21/2013 2025 and just came off steroids.  He is on BiPAP moderate respiratory distress with admitted to the intensive care unit used cefepime  and vancomycin  prolotherapy Solu-Medrol  monitor electrolytes.  She may require intubation in the near future Long discussion with family was had about this and they want intubation as a last ditch.  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  Stable on high flow nasal cannula at 12 L  Objective    Blood pressure (!) 142/68, pulse 85, temperature 97.7 F (36.5 C), temperature source Oral, resp. rate (!) 23, height 5\' 10"  (1.778 m), weight 93.6 kg, SpO2  91%.        Intake/Output Summary (Last 24 hours) at 10/16/2023 1016 Last data filed at 10/16/2023 0900 Gross per 24 hour  Intake 2456.86 ml  Output 1380 ml  Net 1076.86 ml   Filed Weights   10/15/23 0700 10/15/23 0954 10/16/23 0500  Weight: 90.7 kg 91.6 kg 93.6 kg    Examination: Blood pressure (!) 142/68, pulse 85, temperature 97.7 F (36.5 C), temperature source Oral, resp. rate (!) 23, height 5\' 10"  (1.778 m), weight 93.6 kg, SpO2 91%. Gen:      No acute distress HEENT:  EOMI, sclera anicteric Neck:     No masses; no thyromegaly Lungs:    Clear to auscultation bilaterally; normal respiratory effort CV:         Regular rate and rhythm; no murmurs Abd:      + bowel sounds; soft, non-tender; no palpable masses, no distension Ext:    No edema; adequate peripheral perfusion Neuro: alert and oriented x 3 Psych: normal mood and affect   Lab/imaging reviewed Significant for BUN/creatinine 31/0.88 AST 11, ALT 10 Procalcitonin 6.85 WBC 6.0, hemoglobin 8.4, platelets 191 Chest x-ray with bilateral airspace disease unchanged from yesterday  Resolved problem list   Assessment and Plan  Acute on chronic respiratory failure in the setting of OSA, kyphoscoliosis Sepsis, present on admission Hospital-acquired pneumonia suspected given elevation in procalcitonin and fevers Other possibilities include Cytoxan  pneumonitis though less likely Continue Vanco, cefepime . Follow cultures Wean on high flow nasal cannula as tolerated.  Use  CPAP at night On Solu-Medrol .  Can change to prednisone  60 mg/day tomorrow  History of multiple myeloma on second line chemotherapy Chemotherapy is on hold Continue acyclovir  for prophylaxis  Protein calorie malnutrition Present on admission.  Encourage p.o. diet  Paroxysmal atrial fibrillation Continue anticoagulation Telemetry monitoring  Chronic pain Continue oxycodone   Anemia secondary to chronic disease, multiple myeloma Monitor  Stable  for transfer out of ICU and to hospitalist service in progressive care  Best Practice (right click and "Reselect all SmartList Selections" daily)   Diet/type: Regular consistency (see orders) DVT prophylaxis DOAC Pressure ulcer(s): N/A GI prophylaxis: PPI Lines: Central line, port Foley:  N/A Code Status:  full code Last date of multidisciplinary goals of care discussion [TBD]  Critical care time: NA   Phyllis Breeze MD [facility] See Amion for pager  If no response to pager , please call [phone] until 7pm After 7:00 pm call Elink  [phone] 10/16/2023, 11:15 AM

## 2023-10-17 ENCOUNTER — Inpatient Hospital Stay (HOSPITAL_COMMUNITY)

## 2023-10-17 DIAGNOSIS — J9621 Acute and chronic respiratory failure with hypoxia: Secondary | ICD-10-CM | POA: Diagnosis not present

## 2023-10-17 DIAGNOSIS — J189 Pneumonia, unspecified organism: Secondary | ICD-10-CM | POA: Diagnosis not present

## 2023-10-17 LAB — BASIC METABOLIC PANEL WITH GFR
Anion gap: 8 (ref 5–15)
BUN: 25 mg/dL — ABNORMAL HIGH (ref 8–23)
CO2: 25 mmol/L (ref 22–32)
Calcium: 8.2 mg/dL — ABNORMAL LOW (ref 8.9–10.3)
Chloride: 108 mmol/L (ref 98–111)
Creatinine, Ser: 0.75 mg/dL (ref 0.61–1.24)
GFR, Estimated: >60 mL/min (ref >60)
Glucose, Bld: 144 mg/dL — ABNORMAL HIGH (ref 70–99)
Potassium: 4.3 mmol/L (ref 3.5–5.1)
Sodium: 141 mmol/L (ref 135–145)

## 2023-10-17 LAB — CBC
HCT: 27.9 % — ABNORMAL LOW (ref 39.0–52.0)
Hemoglobin: 8.9 g/dL — ABNORMAL LOW (ref 13.0–17.0)
MCH: 27.6 pg (ref 26.0–34.0)
MCHC: 31.9 g/dL (ref 30.0–36.0)
MCV: 86.6 fL (ref 80.0–100.0)
Platelets: 268 10*3/uL (ref 150–400)
RBC: 3.22 MIL/uL — ABNORMAL LOW (ref 4.22–5.81)
RDW: 17.3 % — ABNORMAL HIGH (ref 11.5–15.5)
WBC: 7.3 10*3/uL (ref 4.0–10.5)
nRBC: 0 % (ref 0.0–0.2)

## 2023-10-17 MED ORDER — IPRATROPIUM-ALBUTEROL 0.5-2.5 (3) MG/3ML IN SOLN
3.0000 mL | Freq: Three times a day (TID) | RESPIRATORY_TRACT | Status: DC
  Administered 2023-10-17 – 2023-10-18 (×5): 3 mL via RESPIRATORY_TRACT
  Filled 2023-10-17 (×6): qty 3

## 2023-10-17 MED ORDER — SODIUM CHLORIDE 0.9 % IV SOLN
500.0000 mg | INTRAVENOUS | Status: AC
  Administered 2023-10-17 – 2023-10-19 (×3): 500 mg via INTRAVENOUS
  Filled 2023-10-17 (×4): qty 5

## 2023-10-17 MED ORDER — IOHEXOL 350 MG/ML SOLN
75.0000 mL | Freq: Once | INTRAVENOUS | Status: AC | PRN
  Administered 2023-10-17: 75 mL via INTRAVENOUS

## 2023-10-17 NOTE — Progress Notes (Signed)
 PROGRESS NOTE  Daniel Whitaker WGN:[id number] DOB: 1945-09-23   PCP: Glena Landau, MD  Patient is from: Home.  DOA: 10/15/2023 LOS: 2  Chief complaints Chief Complaint  Patient presents with   Respiratory Distress     Brief Narrative / Interim history: 78 year old M with PMH of OSA on CPAP, chronic hypoxic RF, kyphosis, MM on second line chemo, PAF on Eliquis , chronic pain, neuropathy and restless leg syndrome presented to ED with increased respiratory distress for about 2 weeks, and admitted to ICU with acute on chronic respiratory failure with hypoxia in the setting of suspected hospital-acquired pneumonia.   In ED, tachycardic to 120s and tachypneic to 26.  Febrile to 100.8.  No leukocytosis.  CMP without significant finding.  Lactic acid was negative.  Procalcitonin elevated.  BNP 100.5.  COVID-19, influenza and RSV PCR nonreactive.  CXR with patchy and confluent airspace disease in both upper lungs concerning for multifocal pneumonia, and chronic interstitial coarsening.  ABG without significant finding.  Patient was started on BiPAP and broad-spectrum antibiotics and admitted to ICU.  Patient was weaned off BiPAP to 12 L by HFNC, and transferred to hospitalist service on 5/18.  Remains on broad-spectrum antibiotics.  Subjective: Seen and examined earlier this morning.  No major events overnight or this morning.  Continues to endorse shortness of breath.  Also reports productive cough with pinkish phlegm.  Denies blood clots or chest pain.  Denies smoking cigarettes.  Reports good compliance with Eliquis .  Patient's son at bedside.  Objective: Vitals:   10/17/23 0500 10/17/23 0600 10/17/23 0736 10/17/23 0800  BP: 136/63 (!) 149/66  (!) 143/74  Pulse: 68 63  76  Resp: 18 17  (!) 22  Temp:   97.6 F (36.4 C)   TempSrc:   Oral   SpO2: 99% 93%  94%  Weight:      Height:        Examination:  GENERAL: No apparent distress.  Nontoxic. HEENT: MMM.  Vision and hearing  grossly intact.  NECK: Supple.  No apparent JVD.  RESP:  No IWOB.  Crackles bilaterally.  Kyphosis. CVS:  RRR. Heart sounds normal.  ABD/GI/GU: BS+. Abd soft, NTND.  MSK/EXT:  Moves extremities. No apparent deformity. No edema.  SKIN: no apparent skin lesion or wound NEURO: Awake, alert and oriented appropriately.  No apparent focal neuro deficit. PSYCH: Calm. Normal affect.   Consultants:  Critical care admitted patient  Procedures: None  Microbiology summarized: COVID-19, influenza and RSV PCR nonreactive A 20 pathogen RVP nonreactive MRSA PCR screen nonreactive. Blood cultures NGTD  Assessment and plan: Severe sepsis due to presumed hospital-acquired pneumonia: Present on arrival.  Heart tachycardia, tachypnea, fever with respiratory failure requiring BiPAP.  Portable CXR concerning for multifocal pneumonia.  Immunocompromised patient on chemotherapy.  Still with significant requirement. - MRSA PCR screen negative.  Vancomycin  discontinued - Continue IV cefepime  - Add Zithromax  for atypical coverage - CT angio chest  Acute on chronic respiratory failure: On 4 to 5 L at baseline but he says he was off oxygen for about a month before he started using about 2 weeks ago.  Required BiPAP in ED.  Currently on 12 L by HFNC.  Could be due to pneumonia.  Also has underlying OSA but no CO2 retention.  Also some temporal association with Cytoxan  raising concern for pneumonitis.  CXR also shows chronic interstitial coarsening.  Reports good compliance with Eliquis . - Antibiotics and CT angio chest as above -Continue steroid - Wean oxygen  as able - Incentive spirometry, OOB, PT/OT   History of multiple myeloma on second line chemotherapy -Chemotherapy is on hold -Continue acyclovir  for prophylaxis -Oncology on board   Paroxysmal atrial fibrillation Continue anticoagulation Telemetry monitoring   Chronic pain/neuropathy/RLS: Stable -Continue oxycodone  and Lyrica   OSA on  CPAP -Continue CPAP at night   Anemia secondary to chronic disease, multiple myeloma: Relatively stable. Recent Labs    09/22/23 0951 09/30/23 0825 10/07/23 0903 10/14/23 1344 10/15/23 0722 10/15/23 0733 10/15/23 0908 10/15/23 0923 10/16/23 0300 10/17/23 0355  HGB 9.3* 9.8* 9.5* 9.1* 10.0* 9.9* 9.7* 8.8* 8.4* 8.9*  - Continue monitoring   Nutrition Body mass index is 29.61 kg/m. - Consult dietitian         Pressure skin injury: Present on admission Pressure Injury 10/15/23 Buttocks Right Stage 1 -  Intact skin with non-blanchable redness of a localized area usually over a bony prominence. (Active)  10/15/23 0930  Location: Buttocks  Location Orientation: Right  Staging: Stage 1 -  Intact skin with non-blanchable redness of a localized area usually over a bony prominence.  Wound Description (Comments):   Present on Admission: Yes  Dressing Type Foam - Lift dressing to assess site every shift 10/17/23 0800   DVT prophylaxis:  SCDs Start: 10/15/23 0843 apixaban  (ELIQUIS ) tablet 5 mg  Code Status: Full code Family Communication: Updated patient's son at bedside Level of care: Progressive Status is: Inpatient Remains inpatient appropriate because: Severe sepsis, HAP and acute respiratory failure with hypoxia   Final disposition: To be determined   55 minutes with more than 50% spent in reviewing records, counseling patient/family and coordinating care.   Sch Meds:  Scheduled Meds:  acyclovir   400 mg Oral Daily   apixaban   5 mg Oral BID   brimonidine   1 drop Left Eye BID   Chlorhexidine  Gluconate Cloth  6 each Topical Daily   dorzolamide -timolol   1 drop Both Eyes BID   guaiFENesin  1,200 mg Oral BID   ipratropium-albuterol   3 mL Nebulization TID   latanoprost   1 drop Both Eyes QHS   lidocaine -prilocaine    Topical Once   methylPREDNISolone  (SOLU-MEDROL ) injection  40 mg Intravenous Q12H   ofloxacin   4 drop Both EARS BID   pantoprazole   40 mg Oral Daily    pregabalin   100 mg Oral q morning   pregabalin   200 mg Oral QHS   sodium chloride  flush  10-40 mL Intracatheter Q12H   Continuous Infusions:  ceFEPime  (MAXIPIME ) IV 2 g (10/17/23 0930)   PRN Meds:.acetaminophen , docusate sodium , ipratropium-albuterol , oxyCODONE , polyethylene glycol, sodium chloride  flush  Antimicrobials: Anti-infectives (From admission, onward)    Start     Dose/Rate Route Frequency Ordered Stop   10/15/23 2000  vancomycin  (VANCOCIN ) IVPB 1000 mg/200 mL premix  Status:  Discontinued        1,000 mg 200 mL/hr over 60 Minutes Intravenous Every 12 hours 10/15/23 0940 10/16/23 1243   10/15/23 1345  acyclovir  (ZOVIRAX ) 200 MG capsule 400 mg        400 mg Oral Daily 10/15/23 1255     10/15/23 1000  ceFEPIme  (MAXIPIME ) 2 g in sodium chloride  0.9 % 100 mL IVPB        2 g 200 mL/hr over 30 Minutes Intravenous Every 8 hours 10/15/23 0940     10/15/23 0715  aztreonam  (AZACTAM ) 2 g in sodium chloride  0.9 % 100 mL IVPB        2 g 200 mL/hr over 30 Minutes Intravenous  Once  10/15/23 0705 10/15/23 0825   10/15/23 0715  metroNIDAZOLE  (FLAGYL ) IVPB 500 mg        500 mg 100 mL/hr over 60 Minutes Intravenous  Once 10/15/23 0705 10/15/23 0845   10/15/23 0715  vancomycin  (VANCOCIN ) IVPB 1000 mg/200 mL premix  Status:  Discontinued        1,000 mg 200 mL/hr over 60 Minutes Intravenous  Once 10/15/23 0705 10/15/23 0706   10/15/23 0715  vancomycin  (VANCOREADY) IVPB 1500 mg/300 mL        1,500 mg 150 mL/hr over 120 Minutes Intravenous  Once 10/15/23 0706 10/15/23 1038        I have personally reviewed the following labs and images: CBC: Recent Labs  Lab 10/14/23 1344 10/14/23 1344 10/15/23 0722 10/15/23 0733 10/15/23 0908 10/15/23 0923 10/16/23 0300 10/17/23 0355  WBC 5.3  --  9.0  --  7.8  --  6.0 7.3  NEUTROABS 3.8  --  7.7  --   --   --   --   --   HGB 9.1*   < > 10.0* 9.9* 9.7* 8.8* 8.4* 8.9*  HCT 27.9*  --  30.9* 29.0* 30.1* 26.0* 26.0* 27.9*  MCV 83.5  --  85.8  --   86.5  --  85.8 86.6  PLT 234  --  261  --  227  --  191 268   < > = values in this interval not displayed.   BMP &GFR Recent Labs  Lab 10/14/23 1344 10/15/23 0722 10/15/23 0733 10/15/23 0908 10/15/23 0923 10/16/23 0300 10/17/23 0355  NA 139 139 140 138 140 139 141  K 3.5 3.5 3.6 3.6 3.3* 4.2 4.3  CL 106 109  --  107  --  108 108  CO2 29 22  --  22  --  23 25  GLUCOSE 119* 147*  --  148*  --  144* 144*  BUN 19 23  --  25*  --  31* 25*  CREATININE 0.64 1.00  --  1.05  --  0.88 0.75  CALCIUM  8.7* 8.5*  --  8.3*  --  8.0* 8.2*  MG  --   --   --  1.8  --  1.9  --   PHOS  --   --   --  2.3*  --   --   --    Estimated Creatinine Clearance (by C-G formula based on SCr of 0.75 mg/dL) Male: 73 mL/min Male: 88.8 mL/min Liver & Pancreas: Recent Labs  Lab 10/14/23 1344 10/15/23 0722 10/15/23 0908 10/16/23 0300  AST 7* 15 13* 11*  ALT 6 11 11 10   ALKPHOS 162* 150* 149* 121  BILITOT 0.4 0.4 0.7 0.4  PROT 5.7* 5.5* 4.9* 4.7*  ALBUMIN  3.4* 2.7* 2.7* 2.4*   No results for input(s): "LIPASE", "AMYLASE" in the last 168 hours. No results for input(s): "AMMONIA" in the last 168 hours. Diabetic: Recent Labs    10/15/23 1112  HGBA1C 5.2   Recent Labs  Lab 10/15/23 0948 10/15/23 1107 10/15/23 1506 10/15/23 1942  GLUCAP 146* 155* 212* 149*   Cardiac Enzymes: No results for input(s): "CKTOTAL", "CKMB", "CKMBINDEX", "TROPONINI" in the last 168 hours. No results for input(s): "PROBNP" in the last 8760 hours. Coagulation Profile: Recent Labs  Lab 10/15/23 0722  INR 1.5*   Thyroid  Function Tests: No results for input(s): "TSH", "T4TOTAL", "FREET4", "T3FREE", "THYROIDAB" in the last 72 hours. Lipid Profile: No results for input(s): "CHOL", "HDL", "LDLCALC", "TRIG", "CHOLHDL", "LDLDIRECT"  in the last 72 hours. Anemia Panel: No results for input(s): "VITAMINB12", "FOLATE", "FERRITIN", "TIBC", "IRON", "RETICCTPCT" in the last 72 hours. Urine analysis:    Component Value  Date/Time   COLORURINE STRAW (A) 08/29/2023 1336   APPEARANCEUR HAZY (A) 08/29/2023 1336   LABSPEC 1.006 08/29/2023 1336   PHURINE 6.0 08/29/2023 1336   GLUCOSEU NEGATIVE 08/29/2023 1336   HGBUR SMALL (A) 08/29/2023 1336   BILIRUBINUR NEGATIVE 08/29/2023 1336   KETONESUR NEGATIVE 08/29/2023 1336   PROTEINUR NEGATIVE 08/29/2023 1336   UROBILINOGEN 1.0 04/15/2013 2256   NITRITE NEGATIVE 08/29/2023 1336   LEUKOCYTESUR MODERATE (A) 08/29/2023 1336   Sepsis Labs: Invalid input(s): "PROCALCITONIN", "LACTICIDVEN"  Microbiology: Recent Results (from the past 240 hours)  Resp panel by RT-PCR (RSV, Flu A&B, Covid) Anterior Nasal Swab     Status: None   Collection Time: 10/15/23  6:56 AM   Specimen: Anterior Nasal Swab  Result Value Ref Range Status   SARS Coronavirus 2 by RT PCR NEGATIVE NEGATIVE Final   Influenza A by PCR NEGATIVE NEGATIVE Final   Influenza B by PCR NEGATIVE NEGATIVE Final    Comment: (NOTE) The Xpert Xpress SARS-CoV-2/FLU/RSV plus assay is intended as an aid in the diagnosis of influenza from Nasopharyngeal swab specimens and should not be used as a sole basis for treatment. Nasal washings and aspirates are unacceptable for Xpert Xpress SARS-CoV-2/FLU/RSV testing.  Fact Sheet for Patients: BloggerCourse.com  Fact Sheet for Healthcare Providers: SeriousBroker.it  This test is not yet approved or cleared by the [place]  FDA and has been authorized for detection and/or diagnosis of SARS-CoV-2 by FDA under an Emergency Use Authorization (EUA). This EUA will remain in effect (meaning this test can be used) for the duration of the COVID-19 declaration under Section 564(b)(1) of the Act, 21 U.S.C. section 360bbb-3(b)(1), unless the authorization is terminated or revoked.     Resp Syncytial Virus by PCR NEGATIVE NEGATIVE Final    Comment: (NOTE) Fact Sheet for  Patients: BloggerCourse.com  Fact Sheet for Healthcare Providers: SeriousBroker.it  This test is not yet approved or cleared by the [place]  FDA and has been authorized for detection and/or diagnosis of SARS-CoV-2 by FDA under an Emergency Use Authorization (EUA). This EUA will remain in effect (meaning this test can be used) for the duration of the COVID-19 declaration under Section 564(b)(1) of the Act, 21 U.S.C. section 360bbb-3(b)(1), unless the authorization is terminated or revoked.  Performed at [facility], [address] [place], [place] [address]   Blood Culture (routine x 2)     Status: None (Preliminary result)   Collection Time: 10/15/23  7:05 AM   Specimen: BLOOD LEFT WRIST  Result Value Ref Range Status   Specimen Description BLOOD LEFT WRIST  Final   Special Requests   Final    BOTTLES DRAWN AEROBIC AND ANAEROBIC Blood Culture adequate volume   Culture   Final    NO GROWTH 2 DAYS Performed at [facility], [address], [place], [place] [address]    Report Status PENDING  Incomplete  Blood Culture (routine x 2)     Status: None (Preliminary result)   Collection Time: 10/15/23  7:10 AM   Specimen: BLOOD  Result Value Ref Range Status   Specimen Description BLOOD SITE NOT SPECIFIED  Final   Special Requests   Final    BOTTLES DRAWN AEROBIC ONLY Blood Culture adequate volume   Culture   Final    NO GROWTH 2  DAYS Performed at [facility], [address] [place], [place] [address]    Report Status PENDING  Incomplete  MRSA Next Gen by PCR, Nasal     Status: None   Collection Time: 10/15/23  8:42 AM   Specimen: Nasal Mucosa; Nasal Swab  Result Value Ref Range Status   MRSA by PCR Next Gen NOT DETECTED NOT DETECTED Final    Comment: (NOTE) The GeneXpert MRSA Assay (FDA approved for NASAL specimens only), is one component of a comprehensive MRSA colonization surveillance program. It  is not intended to diagnose MRSA infection nor to guide or monitor treatment for MRSA infections. Test performance is not FDA approved in patients less than 96 years old. Performed at [facility], [address] [place], [place] [address]   Respiratory (~20 pathogens) panel by PCR     Status: None   Collection Time: 10/15/23 12:51 PM   Specimen: Nasopharyngeal Swab; Respiratory  Result Value Ref Range Status   Adenovirus NOT DETECTED NOT DETECTED Final   Coronavirus 229E NOT DETECTED NOT DETECTED Final    Comment: (NOTE) The Coronavirus on the Respiratory Panel, DOES NOT test for the novel  Coronavirus (2019 nCoV)    Coronavirus HKU1 NOT DETECTED NOT DETECTED Final   Coronavirus NL63 NOT DETECTED NOT DETECTED Final   Coronavirus OC43 NOT DETECTED NOT DETECTED Final   Metapneumovirus NOT DETECTED NOT DETECTED Final   Rhinovirus / Enterovirus NOT DETECTED NOT DETECTED Final   Influenza A NOT DETECTED NOT DETECTED Final   Influenza B NOT DETECTED NOT DETECTED Final   Parainfluenza Virus 1 NOT DETECTED NOT DETECTED Final   Parainfluenza Virus 2 NOT DETECTED NOT DETECTED Final   Parainfluenza Virus 3 NOT DETECTED NOT DETECTED Final   Parainfluenza Virus 4 NOT DETECTED NOT DETECTED Final   Respiratory Syncytial Virus NOT DETECTED NOT DETECTED Final   Bordetella pertussis NOT DETECTED NOT DETECTED Final   Bordetella Parapertussis NOT DETECTED NOT DETECTED Final   Chlamydophila pneumoniae NOT DETECTED NOT DETECTED Final   Mycoplasma pneumoniae NOT DETECTED NOT DETECTED Final    Comment: Performed at [facility], [address], [place], [place] [address]    Radiology Studies: No results found.    Kyliyah Stirn T. Bret Stamour [facility]  If 7PM-7AM, please contact night-coverage [email or web address] 10/17/2023, 10:58 AM

## 2023-10-17 NOTE — Evaluation (Signed)
 Physical Therapy Evaluation Patient Details Name: Daniel Whitaker MRN: [id number] DOB: 01-18-1946 Today's Date: 10/17/2023  History of Present Illness  Pt is a 78 y.o. male admitted 5/16 with respiratory failure. Initially requiring bipap but transitioned to HFNC 5/17. Pt with mulitple recent hospitalizations. Dx with multiple myeloma in 2024 and currently undergoing chemotherapy. PMH: a fib, MGUS, cataracts, glaucoma, obesity, RLS, spinal stenosis, neuropathy, chronic rib fxs   Clinical Impression  Pt admitted with above diagnosis. PTA pt lived at home with family, mod I household distances with rollator. On 4L O2 at baseline. Pt currently with functional limitations due to the deficits listed below (see PT Problem List). On eval, pt required min assist bed mobility, and min assist transfers. Static stand with rollator < 30 seconds resulting in desat to 86% on 12L. Increased WOB and anxiety noted. 2-3 minute seated rest break to recover to 90%. Pt will benefit from acute skilled PT to increase their independence and safety with mobility to allow discharge. PT to follow acutely and progress mobility as tolerated. Post acute, pt would benefit from HHPT.          If plan is discharge home, recommend the following: A little help with walking and/or transfers;A little help with bathing/dressing/bathroom;Help with stairs or ramp for entrance;Assistance with cooking/housework;Assist for transportation   Can travel by private vehicle        Equipment Recommendations Wheelchair (measurements PT);Wheelchair cushion (measurements PT)  Recommendations for Other Services       Functional Status Assessment Patient has had a recent decline in their functional status and demonstrates the ability to make significant improvements in function in a reasonable and predictable amount of time.     Precautions / Restrictions Precautions Precautions: Fall;Other (comment) Precaution/Restrictions Comments:  chronic rib fxs, watch sats      Mobility  Bed Mobility Overal bed mobility: Needs Assistance Bed Mobility: Supine to Sit     Supine to sit: Min assist, Used rails, HOB elevated          Transfers Overall transfer level: Needs assistance Equipment used: Rollator (4 wheels) Transfers: Sit to/from Stand, Bed to chair/wheelchair/BSC Sit to Stand: Min assist Stand pivot transfers: Min assist         General transfer comment: assist to power up. Desat to 86% after static stand < 30 seconds. Increased WOB, anxiety noted.    Ambulation/Gait               General Gait Details: unable due to desat with static stand  Stairs            Wheelchair Mobility     Tilt Bed    Modified Rankin (Stroke Patients Only)       Balance Overall balance assessment: Needs assistance Sitting-balance support: Feet supported, No upper extremity supported Sitting balance-Leahy Scale: Good     Standing balance support: Bilateral upper extremity supported, During functional activity, Reliant on assistive device for balance Standing balance-Leahy Scale: Poor                               Pertinent Vitals/Pain Pain Assessment Pain Assessment: No/denies pain    Home Living Family/patient expects to be discharged to:: Private residence Living Arrangements: Spouse/significant other;Children Available Help at Discharge: Family;Available 24 hours/day Type of Home: House Home Access: Stairs to enter Entrance Stairs-Rails: None Entrance Stairs-Number of Steps: 2   Home Layout: One level Home Equipment: Grab bars -  tub/shower;Grab bars - toilet;Shower seat - built in;Rollator (4 wheels);Adaptive equipment;Other (comment) (home O2- 4L) Additional Comments: Currently staying at his daughter's house due to it being a 1-level home.    Prior Function Prior Level of Function : Independent/Modified Independent             Mobility Comments: rollator household  distances ADLs Comments: wife recently started assisting with showers and LB dressing     Extremity/Trunk Assessment   Upper Extremity Assessment Upper Extremity Assessment: Generalized weakness;LUE deficits/detail LUE Deficits / Details: limited shoulder ROM    Lower Extremity Assessment Lower Extremity Assessment: Generalized weakness    Cervical / Trunk Assessment Cervical / Trunk Assessment: Kyphotic  Communication   Communication Communication: No apparent difficulties    Cognition Arousal: Alert Behavior During Therapy: WFL for tasks assessed/performed   PT - Cognitive impairments: No apparent impairments                         Following commands: Intact       Cueing       General Comments General comments (skin integrity, edema, etc.): SpO2 93% at rest on 12L. Desat to 86% during static stand. 2-3 minute seated rest to return to 90%. BP 172/86    Exercises     Assessment/Plan    PT Assessment Patient needs continued PT services  PT Problem List Decreased strength;Decreased balance;Decreased mobility;Cardiopulmonary status limiting activity;Decreased activity tolerance       PT Treatment Interventions Functional mobility training;Balance training;Patient/family education;Gait training;Therapeutic activities;Therapeutic exercise;Stair training    PT Goals (Current goals can be found in the Care Plan section)  Acute Rehab PT Goals Patient Stated Goal: home PT Goal Formulation: With patient Time For Goal Achievement: 10/31/23 Potential to Achieve Goals: Fair    Frequency Min 2X/week     Co-evaluation               AM-PAC PT "6 Clicks" Mobility  Outcome Measure Help needed turning from your back to your side while in a flat bed without using bedrails?: A Little Help needed moving from lying on your back to sitting on the side of a flat bed without using bedrails?: A Little Help needed moving to and from a bed to a chair (including a  wheelchair)?: A Little Help needed standing up from a chair using your arms (e.g., wheelchair or bedside chair)?: A Little Help needed to walk in hospital room?: A Little Help needed climbing 3-5 steps with a railing? : A Lot 6 Click Score: 17    End of Session Equipment Utilized During Treatment: Oxygen Activity Tolerance: Treatment limited secondary to medical complications (Comment) (respiratory status) Patient left: in chair;with call bell/phone within reach;with family/visitor present Nurse Communication: Mobility status PT Visit Diagnosis: Difficulty in walking, not elsewhere classified (R26.2)    Time: 1610-9604 PT Time Calculation (min) (ACUTE ONLY): 14 min   Charges:   PT Evaluation $PT Eval Moderate Complexity: 1 Mod   PT General Charges $$ ACUTE PT VISIT: 1 Visit         Daniel Gathers., PT  Office # [phone]   Daniel Whitaker 10/17/2023, 2:55 PM

## 2023-10-18 DIAGNOSIS — J9601 Acute respiratory failure with hypoxia: Secondary | ICD-10-CM | POA: Diagnosis not present

## 2023-10-18 DIAGNOSIS — J9621 Acute and chronic respiratory failure with hypoxia: Secondary | ICD-10-CM | POA: Diagnosis not present

## 2023-10-18 DIAGNOSIS — C9000 Multiple myeloma not having achieved remission: Secondary | ICD-10-CM

## 2023-10-18 DIAGNOSIS — J189 Pneumonia, unspecified organism: Secondary | ICD-10-CM | POA: Diagnosis not present

## 2023-10-18 LAB — RENAL FUNCTION PANEL
Albumin: 2.5 g/dL — ABNORMAL LOW (ref 3.5–5.0)
Anion gap: 6 (ref 5–15)
BUN: 26 mg/dL — ABNORMAL HIGH (ref 8–23)
CO2: 26 mmol/L (ref 22–32)
Calcium: 8.1 mg/dL — ABNORMAL LOW (ref 8.9–10.3)
Chloride: 110 mmol/L (ref 98–111)
Creatinine, Ser: 0.68 mg/dL (ref 0.61–1.24)
GFR, Estimated: >60 mL/min (ref >60)
Glucose, Bld: 140 mg/dL — ABNORMAL HIGH (ref 70–99)
Phosphorus: 2.4 mg/dL — ABNORMAL LOW (ref 2.5–4.6)
Potassium: 4.2 mmol/L (ref 3.5–5.1)
Sodium: 142 mmol/L (ref 135–145)

## 2023-10-18 LAB — MAGNESIUM: Magnesium: 2.3 mg/dL (ref 1.7–2.4)

## 2023-10-18 LAB — CBC
HCT: 27.6 % — ABNORMAL LOW (ref 39.0–52.0)
Hemoglobin: 8.5 g/dL — ABNORMAL LOW (ref 13.0–17.0)
MCH: 27 pg (ref 26.0–34.0)
MCHC: 30.8 g/dL (ref 30.0–36.0)
MCV: 87.6 fL (ref 80.0–100.0)
Platelets: 266 10*3/uL (ref 150–400)
RBC: 3.15 MIL/uL — ABNORMAL LOW (ref 4.22–5.81)
RDW: 17.4 % — ABNORMAL HIGH (ref 11.5–15.5)
WBC: 6.3 10*3/uL (ref 4.0–10.5)
nRBC: 0 % (ref 0.0–0.2)

## 2023-10-18 LAB — BRAIN NATRIURETIC PEPTIDE: B Natriuretic Peptide: 245.3 pg/mL — ABNORMAL HIGH (ref 0.0–100.0)

## 2023-10-18 MED ORDER — POLYETHYLENE GLYCOL 3350 17 G PO PACK: 17.0000 g | PACK | Freq: Two times a day (BID) | ORAL | Status: DC | PRN

## 2023-10-18 MED ORDER — FUROSEMIDE 10 MG/ML IJ SOLN
40.0000 mg | Freq: Once | INTRAMUSCULAR | Status: AC
  Administered 2023-10-18: 40 mg via INTRAVENOUS
  Filled 2023-10-18: qty 4

## 2023-10-18 MED ORDER — PREDNISONE 20 MG PO TABS: 20.0000 mg | ORAL_TABLET | Freq: Every day | ORAL | Status: DC

## 2023-10-18 MED ORDER — OXYCODONE HCL 5 MG PO TABS
5.0000 mg | ORAL_TABLET | Freq: Four times a day (QID) | ORAL | Status: DC | PRN
  Administered 2023-10-18 (×2): 10 mg via ORAL
  Filled 2023-10-18 (×2): qty 2

## 2023-10-18 MED ORDER — PREDNISONE 20 MG PO TABS: 40.0000 mg | ORAL_TABLET | Freq: Every day | ORAL | Status: DC

## 2023-10-18 MED ORDER — PREDNISONE 20 MG PO TABS
40.0000 mg | ORAL_TABLET | Freq: Every day | ORAL | Status: AC
  Administered 2023-10-19: 40 mg via ORAL
  Filled 2023-10-18: qty 2

## 2023-10-18 MED ORDER — ORAL CARE MOUTH RINSE: 15.0000 mL | OROMUCOSAL | Status: DC | PRN

## 2023-10-18 MED ORDER — LIDOCAINE 5 % EX PTCH
1.0000 | MEDICATED_PATCH | CUTANEOUS | Status: DC
  Administered 2023-10-18 – 2023-10-19 (×2): 1 via TRANSDERMAL
  Filled 2023-10-18 (×2): qty 1

## 2023-10-18 MED ORDER — PREDNISONE 20 MG PO TABS
30.0000 mg | ORAL_TABLET | Freq: Every day | ORAL | Status: DC
  Administered 2023-10-20: 30 mg via ORAL
  Filled 2023-10-18: qty 1

## 2023-10-18 MED ORDER — K PHOS MONO-SOD PHOS DI & MONO 155-852-130 MG PO TABS
500.0000 mg | ORAL_TABLET | Freq: Two times a day (BID) | ORAL | Status: AC
  Administered 2023-10-18 (×2): 500 mg via ORAL
  Filled 2023-10-18 (×2): qty 2

## 2023-10-18 MED ORDER — PREDNISONE 10 MG PO TABS: 5.0000 mg | ORAL_TABLET | Freq: Every day | ORAL | Status: DC

## 2023-10-18 MED ORDER — PREDNISONE 10 MG PO TABS: 10.0000 mg | ORAL_TABLET | Freq: Every day | ORAL | Status: DC

## 2023-10-18 MED ORDER — SENNOSIDES-DOCUSATE SODIUM 8.6-50 MG PO TABS: 1.0000 | ORAL_TABLET | Freq: Two times a day (BID) | ORAL | Status: DC | PRN

## 2023-10-18 MED ORDER — PREDNISONE 20 MG PO TABS: 30.0000 mg | ORAL_TABLET | Freq: Every day | ORAL | Status: DC

## 2023-10-18 MED ORDER — POLYETHYLENE GLYCOL 3350 17 G PO PACK
17.0000 g | PACK | Freq: Every day | ORAL | Status: DC
  Administered 2023-10-18: 17 g via ORAL
  Filled 2023-10-18: qty 1

## 2023-10-18 MED ORDER — SENNOSIDES-DOCUSATE SODIUM 8.6-50 MG PO TABS
2.0000 | ORAL_TABLET | Freq: Once | ORAL | Status: AC
  Administered 2023-10-18: 2 via ORAL
  Filled 2023-10-18: qty 2

## 2023-10-18 NOTE — Progress Notes (Signed)
 PROGRESS NOTE  Daniel Whitaker WUX:[id number] DOB: Oct 09, 1945   PCP: Glena Landau, MD  Patient is from: Home.  DOA: 10/15/2023 LOS: 3  Chief complaints Chief Complaint  Patient presents with   Respiratory Distress     Brief Narrative / Interim history: 78 year old M with PMH of OSA on CPAP, chronic hypoxic RF, kyphosis, MM on second line chemo, PAF on Eliquis , chronic pain, neuropathy and restless leg syndrome presented to ED with increased respiratory distress for about 2 weeks, and admitted to ICU with acute on chronic respiratory failure with hypoxia in the setting of suspected hospital-acquired pneumonia.   In ED, tachycardic to 120s and tachypneic to 26.  Febrile to 100.8.  No leukocytosis.  CMP without significant finding.  Lactic acid was negative.  Procalcitonin elevated.  BNP 100.5.  COVID-19, influenza and RSV PCR nonreactive.  CXR with patchy and confluent airspace disease in both upper lungs concerning for multifocal pneumonia, and chronic interstitial coarsening.  ABG without significant finding.  Patient was started on BiPAP and broad-spectrum antibiotics and admitted to ICU.  Patient was weaned off BiPAP to 12 L by HFNC, and transferred to hospitalist service on 5/18.  CT angio chest negative for PE but new small bilateral pleural effusions and nonspecific new multifocal GGO opacities throughout both lungs in the upper and midlung predominance which could be infectious, inflammatory or edema.  Remains on broad-spectrum antibiotics and steroid.  Oxygen requirement improving.    Subjective: Seen and examined earlier this morning.  No major events overnight of this morning.  Reports feeling better.  Oxygen requirement improved.  Now down to 6 L by HFNC.  Still with productive cough with pinkish phlegm.  Denies chest pain.  Patient's wife and daughter at bedside.  Objective: Vitals:   10/18/23 0812 10/18/23 1031 10/18/23 1128 10/18/23 1159  BP:      Pulse:  86  66   Resp:  (!) 25  18  Temp: 97.6 F (36.4 C)  98.2 F (36.8 C)   TempSrc: Oral  Axillary   SpO2:  100%  96%  Weight:      Height:        Examination:  GENERAL: No apparent distress.  Nontoxic.  Sitting on bedside chair. HEENT: MMM.  Vision and hearing grossly intact.  NECK: Supple.  No apparent JVD.  RESP:  No IWOB.   Kyphosis. CVS:  RRR. Heart sounds normal.  ABD/GI/GU: BS+. Abd soft, NTND.  MSK/EXT:  Moves extremities. No apparent deformity. No edema.  SKIN: no apparent skin lesion or wound NEURO: Awake, alert and oriented appropriately.  No apparent focal neuro deficit. PSYCH: Calm. Normal affect.     Consultants:  Critical care admitted patient  Procedures: None  Microbiology summarized: COVID-19, influenza and RSV PCR nonreactive A 20 pathogen RVP nonreactive MRSA PCR screen nonreactive. Blood cultures NGTD  Assessment and plan: Severe sepsis due to presumed HCAP: Present on arrival.  Heart tachycardia, tachypnea, fever with respiratory failure requiring BiPAP.  CT angio chest negative for PE but new small bilateral pleural effusions and nonspecific new multifocal GGO opacities throughout both lungs in the upper and midlung predominance likely infectious +/- edema versus inflammatory process.  Immunocompromised patient on chemotherapy.  Oxygen requirement improved -MRSA PCR screen negative.  Vancomycin  discontinued on 5/17 -Cefepime  5/16>>.  Will switch to Levaquin  on 5/20 -Zithromax  5/18>> will switch to Levaquin  5/20 - IS, OOB, PT/OT  Acute on chronic respiratory failure: On 4 to 5 L at baseline but he says  he was off oxygen for about a month before he started using about 2 weeks ago.  Required BiPAP in ED. Also has underlying OSA but no CO2 retention.  CT angio chest as above.  Some temporal association with Cytoxan  raising concern for pneumonitis.  Improving with broad-spectrum antibiotics and steroid.  Now down to 6 L by HFNC. -Antibiotics as above. -Change  steroid to p.o. prednisone  taper on 5/20. -IV Lasix  40 mg x 1.  Strict intake and output. -Wean oxygen as able -Incentive spirometry, OOB, PT/OT   History of multiple myeloma on second line chemotherapy -Chemotherapy is on hold -Continue acyclovir  for prophylaxis -Oncology on board   Paroxysmal atrial fibrillation -Continue anticoagulation -Telemetry monitoring   Chronic pain/neuropathy/RLS: Stable -Continue oxycodone  and Lyrica   OSA on CPAP -Continue CPAP at night   Anemia secondary to chronic disease, multiple myeloma: Relatively stable. Recent Labs    09/30/23 0825 10/07/23 0903 10/14/23 1344 10/15/23 0722 10/15/23 0733 10/15/23 0908 10/15/23 0923 10/16/23 0300 10/17/23 0355 10/18/23 0420  HGB 9.8* 9.5* 9.1* 10.0* 9.9* 9.7* 8.8* 8.4* 8.9* 8.5*  -Continue monitoring   Nutrition Body mass index is 29.61 kg/m. -Dietitian consulted.         Pressure skin injury: Present on admission Pressure Injury 10/15/23 Buttocks Right Stage 1 -  Intact skin with non-blanchable redness of a localized area usually over a bony prominence. (Active)  10/15/23 0930  Location: Buttocks  Location Orientation: Right  Staging: Stage 1 -  Intact skin with non-blanchable redness of a localized area usually over a bony prominence.  Wound Description (Comments):   Present on Admission: Yes  Dressing Type Foam - Lift dressing to assess site every shift 10/18/23 0400   DVT prophylaxis:  SCDs Start: 10/15/23 0843 apixaban  (ELIQUIS ) tablet 5 mg  Code Status: Full code Family Communication: Updated patient's wife and daughter at bedside Level of care: Progressive Status is: Inpatient Remains inpatient appropriate because: Severe sepsis, HAP and acute respiratory failure with hypoxia   Final disposition: Home with home health when medically stable for discharge   55 minutes with more than 50% spent in reviewing records, counseling patient/family and coordinating care.   Sch  Meds:  Scheduled Meds:  acyclovir   400 mg Oral Daily   apixaban   5 mg Oral BID   brimonidine   1 drop Left Eye BID   Chlorhexidine  Gluconate Cloth  6 each Topical Daily   dorzolamide -timolol   1 drop Both Eyes BID   ipratropium-albuterol   3 mL Nebulization TID   latanoprost   1 drop Both Eyes QHS   lidocaine -prilocaine    Topical Once   ofloxacin   4 drop Both EARS BID   pantoprazole   40 mg Oral Daily   phosphorus  500 mg Oral BID   polyethylene glycol  17 g Oral Daily   [START ON 10/19/2023] predniSONE   40 mg Oral Q breakfast   Followed by   Cecily Cohen ON 10/20/2023] predniSONE   30 mg Oral Q breakfast   Followed by   Cecily Cohen ON 10/22/2023] predniSONE   20 mg Oral Q breakfast   Followed by   Cecily Cohen ON 10/24/2023] predniSONE   10 mg Oral Q breakfast   Followed by   Cecily Cohen ON 10/26/2023] predniSONE   5 mg Oral Q breakfast   pregabalin   100 mg Oral q morning   pregabalin   200 mg Oral QHS   sodium chloride  flush  10-40 mL Intracatheter Q12H   Continuous Infusions:  azithromycin  Stopped (10/17/23 1321)   ceFEPime  (MAXIPIME ) IV Stopped (10/18/23 1009)  PRN Meds:.acetaminophen , docusate sodium , ipratropium-albuterol , mouth rinse, sodium chloride  flush  Antimicrobials: Anti-infectives (From admission, onward)    Start     Dose/Rate Route Frequency Ordered Stop   10/17/23 1200  azithromycin  (ZITHROMAX ) 500 mg in sodium chloride  0.9 % 250 mL IVPB        500 mg 250 mL/hr over 60 Minutes Intravenous Every 24 hours 10/17/23 1108 10/20/23 1159   10/15/23 2000  vancomycin  (VANCOCIN ) IVPB 1000 mg/200 mL premix  Status:  Discontinued        1,000 mg 200 mL/hr over 60 Minutes Intravenous Every 12 hours 10/15/23 0940 10/16/23 1243   10/15/23 1345  acyclovir  (ZOVIRAX ) 200 MG capsule 400 mg        400 mg Oral Daily 10/15/23 1255     10/15/23 1000  ceFEPIme  (MAXIPIME ) 2 g in sodium chloride  0.9 % 100 mL IVPB        2 g 200 mL/hr over 30 Minutes Intravenous Every 8 hours 10/15/23 0940     10/15/23 0715   aztreonam  (AZACTAM ) 2 g in sodium chloride  0.9 % 100 mL IVPB        2 g 200 mL/hr over 30 Minutes Intravenous  Once 10/15/23 0705 10/15/23 0825   10/15/23 0715  metroNIDAZOLE  (FLAGYL ) IVPB 500 mg        500 mg 100 mL/hr over 60 Minutes Intravenous  Once 10/15/23 0705 10/15/23 0845   10/15/23 0715  vancomycin  (VANCOCIN ) IVPB 1000 mg/200 mL premix  Status:  Discontinued        1,000 mg 200 mL/hr over 60 Minutes Intravenous  Once 10/15/23 0705 10/15/23 0706   10/15/23 0715  vancomycin  (VANCOREADY) IVPB 1500 mg/300 mL        1,500 mg 150 mL/hr over 120 Minutes Intravenous  Once 10/15/23 0706 10/15/23 1038        I have personally reviewed the following labs and images: CBC: Recent Labs  Lab 10/14/23 1344 10/15/23 0722 10/15/23 0733 10/15/23 0908 10/15/23 0923 10/16/23 0300 10/17/23 0355 10/18/23 0420  WBC 5.3 9.0  --  7.8  --  6.0 7.3 6.3  NEUTROABS 3.8 7.7  --   --   --   --   --   --   HGB 9.1* 10.0*   < > 9.7* 8.8* 8.4* 8.9* 8.5*  HCT 27.9* 30.9*   < > 30.1* 26.0* 26.0* 27.9* 27.6*  MCV 83.5 85.8  --  86.5  --  85.8 86.6 87.6  PLT 234 261  --  227  --  191 268 266   < > = values in this interval not displayed.   BMP &GFR Recent Labs  Lab 10/15/23 0722 10/15/23 0733 10/15/23 0908 10/15/23 0923 10/16/23 0300 10/17/23 0355 10/18/23 0420  NA 139   < > 138 140 139 141 142  K 3.5   < > 3.6 3.3* 4.2 4.3 4.2  CL 109  --  107  --  108 108 110  CO2 22  --  22  --  23 25 26   GLUCOSE 147*  --  148*  --  144* 144* 140*  BUN 23  --  25*  --  31* 25* 26*  CREATININE 1.00  --  1.05  --  0.88 0.75 0.68  CALCIUM  8.5*  --  8.3*  --  8.0* 8.2* 8.1*  MG  --   --  1.8  --  1.9  --  2.3  PHOS  --   --  2.3*  --   --   --  2.4*   < > = values in this interval not displayed.   Estimated Creatinine Clearance (by C-G formula based on SCr of 0.68 mg/dL) Male: 73 mL/min Male: 88.8 mL/min Liver & Pancreas: Recent Labs  Lab 10/14/23 1344 10/15/23 0722 10/15/23 0908 10/16/23 0300  10/18/23 0420  AST 7* 15 13* 11*  --   ALT 6 11 11 10   --   ALKPHOS 162* 150* 149* 121  --   BILITOT 0.4 0.4 0.7 0.4  --   PROT 5.7* 5.5* 4.9* 4.7*  --   ALBUMIN  3.4* 2.7* 2.7* 2.4* 2.5*   No results for input(s): "LIPASE", "AMYLASE" in the last 168 hours. No results for input(s): "AMMONIA" in the last 168 hours. Diabetic: No results for input(s): "HGBA1C" in the last 72 hours.  Recent Labs  Lab 10/15/23 0948 10/15/23 1107 10/15/23 1506 10/15/23 1942  GLUCAP 146* 155* 212* 149*   Cardiac Enzymes: No results for input(s): "CKTOTAL", "CKMB", "CKMBINDEX", "TROPONINI" in the last 168 hours. No results for input(s): "PROBNP" in the last 8760 hours. Coagulation Profile: Recent Labs  Lab 10/15/23 0722  INR 1.5*   Thyroid  Function Tests: No results for input(s): "TSH", "T4TOTAL", "FREET4", "T3FREE", "THYROIDAB" in the last 72 hours. Lipid Profile: No results for input(s): "CHOL", "HDL", "LDLCALC", "TRIG", "CHOLHDL", "LDLDIRECT" in the last 72 hours. Anemia Panel: No results for input(s): "VITAMINB12", "FOLATE", "FERRITIN", "TIBC", "IRON", "RETICCTPCT" in the last 72 hours. Urine analysis:    Component Value Date/Time   COLORURINE STRAW (A) 08/29/2023 1336   APPEARANCEUR HAZY (A) 08/29/2023 1336   LABSPEC 1.006 08/29/2023 1336   PHURINE 6.0 08/29/2023 1336   GLUCOSEU NEGATIVE 08/29/2023 1336   HGBUR SMALL (A) 08/29/2023 1336   BILIRUBINUR NEGATIVE 08/29/2023 1336   KETONESUR NEGATIVE 08/29/2023 1336   PROTEINUR NEGATIVE 08/29/2023 1336   UROBILINOGEN 1.0 04/15/2013 2256   NITRITE NEGATIVE 08/29/2023 1336   LEUKOCYTESUR MODERATE (A) 08/29/2023 1336   Sepsis Labs: Invalid input(s): "PROCALCITONIN", "LACTICIDVEN"  Microbiology: Recent Results (from the past 240 hours)  Resp panel by RT-PCR (RSV, Flu A&B, Covid) Anterior Nasal Swab     Status: None   Collection Time: 10/15/23  6:56 AM   Specimen: Anterior Nasal Swab  Result Value Ref Range Status   SARS Coronavirus 2  by RT PCR NEGATIVE NEGATIVE Final   Influenza A by PCR NEGATIVE NEGATIVE Final   Influenza B by PCR NEGATIVE NEGATIVE Final    Comment: (NOTE) The Xpert Xpress SARS-CoV-2/FLU/RSV plus assay is intended as an aid in the diagnosis of influenza from Nasopharyngeal swab specimens and should not be used as a sole basis for treatment. Nasal washings and aspirates are unacceptable for Xpert Xpress SARS-CoV-2/FLU/RSV testing.  Fact Sheet for Patients: BloggerCourse.com  Fact Sheet for Healthcare Providers: SeriousBroker.it  This test is not yet approved or cleared by the [place]  FDA and has been authorized for detection and/or diagnosis of SARS-CoV-2 by FDA under an Emergency Use Authorization (EUA). This EUA will remain in effect (meaning this test can be used) for the duration of the COVID-19 declaration under Section 564(b)(1) of the Act, 21 U.S.C. section 360bbb-3(b)(1), unless the authorization is terminated or revoked.     Resp Syncytial Virus by PCR NEGATIVE NEGATIVE Final    Comment: (NOTE) Fact Sheet for Patients: BloggerCourse.com  Fact Sheet for Healthcare Providers: SeriousBroker.it  This test is not yet approved or cleared by the [place]  FDA and has been authorized for detection and/or diagnosis of SARS-CoV-2 by FDA under an  Emergency Use Authorization (EUA). This EUA will remain in effect (meaning this test can be used) for the duration of the COVID-19 declaration under Section 564(b)(1) of the Act, 21 U.S.C. section 360bbb-3(b)(1), unless the authorization is terminated or revoked.  Performed at [facility], [address] [place], [place] [address]   Blood Culture (routine x 2)     Status: None (Preliminary result)   Collection Time: 10/15/23  7:05 AM   Specimen: BLOOD LEFT WRIST  Result Value Ref Range Status   Specimen Description BLOOD LEFT  WRIST  Final   Special Requests   Final    BOTTLES DRAWN AEROBIC AND ANAEROBIC Blood Culture adequate volume   Culture   Final    NO GROWTH 3 DAYS Performed at [facility], [address], [place], [place] [address]    Report Status PENDING  Incomplete  Blood Culture (routine x 2)     Status: None (Preliminary result)   Collection Time: 10/15/23  7:10 AM   Specimen: BLOOD  Result Value Ref Range Status   Specimen Description BLOOD SITE NOT SPECIFIED  Final   Special Requests   Final    BOTTLES DRAWN AEROBIC ONLY Blood Culture adequate volume   Culture   Final    NO GROWTH 3 DAYS Performed at [facility], [address] [place], [place] [address]    Report Status PENDING  Incomplete  MRSA Next Gen by PCR, Nasal     Status: None   Collection Time: 10/15/23  8:42 AM   Specimen: Nasal Mucosa; Nasal Swab  Result Value Ref Range Status   MRSA by PCR Next Gen NOT DETECTED NOT DETECTED Final    Comment: (NOTE) The GeneXpert MRSA Assay (FDA approved for NASAL specimens only), is one component of a comprehensive MRSA colonization surveillance program. It is not intended to diagnose MRSA infection nor to guide or monitor treatment for MRSA infections. Test performance is not FDA approved in patients less than 25 years old. Performed at [facility], [address], [place], [place] [address]   Respiratory (~20 pathogens) panel by PCR     Status: None   Collection Time: 10/15/23 12:51 PM   Specimen: Nasopharyngeal Swab; Respiratory  Result Value Ref Range Status   Adenovirus NOT DETECTED NOT DETECTED Final   Coronavirus 229E NOT DETECTED NOT DETECTED Final    Comment: (NOTE) The Coronavirus on the Respiratory Panel, DOES NOT test for the novel  Coronavirus (2019 nCoV)    Coronavirus HKU1 NOT DETECTED NOT DETECTED Final   Coronavirus NL63 NOT DETECTED NOT DETECTED Final   Coronavirus OC43 NOT DETECTED NOT DETECTED Final   Metapneumovirus NOT DETECTED NOT  DETECTED Final   Rhinovirus / Enterovirus NOT DETECTED NOT DETECTED Final   Influenza A NOT DETECTED NOT DETECTED Final   Influenza B NOT DETECTED NOT DETECTED Final   Parainfluenza Virus 1 NOT DETECTED NOT DETECTED Final   Parainfluenza Virus 2 NOT DETECTED NOT DETECTED Final   Parainfluenza Virus 3 NOT DETECTED NOT DETECTED Final   Parainfluenza Virus 4 NOT DETECTED NOT DETECTED Final   Respiratory Syncytial Virus NOT DETECTED NOT DETECTED Final   Bordetella pertussis NOT DETECTED NOT DETECTED Final   Bordetella Parapertussis NOT DETECTED NOT DETECTED Final   Chlamydophila pneumoniae NOT DETECTED NOT DETECTED Final   Mycoplasma pneumoniae NOT DETECTED NOT DETECTED Final    Comment: Performed at [facility], [address] [place], [place] [address]  Radiology Studies: CT Angio Chest Pulmonary Embolism (PE) W or WO Contrast Result Date: 10/17/2023 CLINICAL DATA:  Acute respiratory failure. EXAM: CT ANGIOGRAPHY CHEST WITH CONTRAST TECHNIQUE: Multidetector CT imaging of the chest was performed using the standard protocol during bolus administration of intravenous contrast. Multiplanar CT image reconstructions and MIPs were obtained to evaluate the vascular anatomy. RADIATION DOSE REDUCTION: This exam was performed according to the departmental dose-optimization program which includes automated exposure control, adjustment of the mA and/or kV according to patient size and/or use of iterative reconstruction technique. CONTRAST:  75mL OMNIPAQUE  IOHEXOL  350 MG/ML SOLN COMPARISON:  PET CT 10/01/2023.  CT angiogram chest 07/26/2023. FINDINGS: Cardiovascular: Satisfactory opacification of the pulmonary arteries to the segmental level. No evidence of pulmonary embolism. Normal heart size. No pericardial effusion. There are atherosclerotic calcifications of the aorta. Right chest port catheter tip ends in the right atrium. Mediastinum/Nodes: No enlarged mediastinal, hilar, or axillary lymph nodes.  Thyroid  gland, trachea, and esophagus demonstrate no significant findings. Lungs/Pleura: There are new small bilateral pleural effusions. There are multifocal ground-glass opacities throughout both lungs in the upper and mid lung predominance. Additionally there are new moderate size there is airspace consolidation in the bilateral upper lobes and minimally in the superior segment of the bilateral lower lobes. There is no pneumothorax. The trachea and central airways are patent. Upper Abdomen: No acute abnormality. Musculoskeletal: Diffuse osseous metastatic disease again noted. Metastatic disease has significantly increased compared to 07/26/2023. There are new subacute/healing sternal fractures in the superior sternum and mid sternum. There are new compression deformities of T3, T4 and T6 when compared to 07/26/2023. There are numerous healed bilateral rib fractures. Review of the MIP images confirms the above findings. IMPRESSION: 1. No evidence for pulmonary embolism. 2. New small bilateral pleural effusions. 3. New multifocal ground-glass opacities throughout both lungs in the upper and mid lung predominance. Findings are nonspecific and may be infectious/inflammatory or related to edema. 4. New moderate size airspace consolidation in the bilateral upper lobes and minimally in the superior segment of the bilateral lower lobes. Findings are worrisome for multifocal pneumonia. 5. Diffuse osseous metastatic disease has significantly increased compared to 07/26/2023. 6. New compression deformities of T3, T4 and T6 and new subacute/healing sternal fractures when compared to 07/26/2023. Aortic Atherosclerosis (ICD10-I70.0). Electronically Signed   By: Tyron Gallon M.D.   On: 10/17/2023 17:39      Farah Lepak T. Jaydeen Odor [facility]  If 7PM-7AM, please contact night-coverage [email or web address] 10/18/2023, 12:22 PM

## 2023-10-18 NOTE — Progress Notes (Signed)
 Ambulated patient 450 feet tolerated very well. Some DOE increased oxygen from 4L to 8L while ambulating SpO2 dropped and maintained 88%-90% on 8L. Recovered rapidly back to baseline with good respirations and SpO2 once sitting.

## 2023-10-18 NOTE — Progress Notes (Signed)
 Daniel Whitaker   DOB:09-26-1945   MW#:[id number]    ASSESSMENT & PLAN:  Multiple myeloma not having achieved remission (HCC) He has IgA kappa multiple myeloma with multiple bone fractures at presentation in 2024.  Bone marrow biopsy showed 15% involvement, standard risk myeloma FISH and cytogenetics   He had initially good partial response with combination chemotherapy with daratumumab , lenalidomide , bortezomib  and dexamethasone  but then started to have progression of disease Treatment was switched to combination treatment with carfilzomib , cyclophosphamide  and dexamethasone  Recent myeloma panel showed positive response to therapy   Chemotherapy is on hold today due to admission Next dose of chemotherapy on May 29 Continue supportive care   Fever, acute respiratory failure, HCAP CT imaging was reviewed Received broad-spectrum IV antibiotics, oxygen, nebulizers and steroids Overall improving, recommend continue incentive spirometer and flutter valve  Mild protein-calorie malnutrition (HCC) Since recent steroid withdrawal, he has lost weight He is noted to have progressive mild protein calorie malnutrition We discussed importance of increased protein in his diet  Cancer related pain He has good pain control He will continue oxycodone  taper We will send outpatient evaluation for possible kyphoplasty in the future in view of multiple compression fractures noted on recent CT imaging  Slight worsening anemia Question hemodilution Observe closely  Other constipation Continue laxatives  Discharge planning I am hopeful he can be discharged home over the next few days Will follow  [name], MD 10/18/2023 9:05 AM  Subjective:  He is feeling better.  He is currently on high flow nasal cannula and breathing better.  Afebrile.  His wife is by the bedside.  His pain is well-controlled.  Constipated for 2 days.  Events over the weekend were noted  Objective:  Vitals:   10/18/23 0722  10/18/23 0812  BP:    Pulse: 69   Resp: 19   Temp:  97.6 F (36.4 C)  SpO2: 95%      Intake/Output Summary (Last 24 hours) at 10/18/2023 0905 Last data filed at 10/18/2023 0200 Gross per 24 hour  Intake 570.04 ml  Output 2100 ml  Net -1529.96 ml

## 2023-10-19 DIAGNOSIS — C9000 Multiple myeloma not having achieved remission: Secondary | ICD-10-CM | POA: Diagnosis not present

## 2023-10-19 DIAGNOSIS — J9601 Acute respiratory failure with hypoxia: Secondary | ICD-10-CM | POA: Diagnosis not present

## 2023-10-19 DIAGNOSIS — J189 Pneumonia, unspecified organism: Secondary | ICD-10-CM | POA: Diagnosis not present

## 2023-10-19 LAB — CBC
HCT: 27.5 % — ABNORMAL LOW (ref 39.0–52.0)
Hemoglobin: 8.6 g/dL — ABNORMAL LOW (ref 13.0–17.0)
MCH: 27.3 pg (ref 26.0–34.0)
MCHC: 31.3 g/dL (ref 30.0–36.0)
MCV: 87.3 fL (ref 80.0–100.0)
Platelets: 256 10*3/uL (ref 150–400)
RBC: 3.15 MIL/uL — ABNORMAL LOW (ref 4.22–5.81)
RDW: 17.5 % — ABNORMAL HIGH (ref 11.5–15.5)
WBC: 6.8 10*3/uL (ref 4.0–10.5)
nRBC: 0 % (ref 0.0–0.2)

## 2023-10-19 LAB — RENAL FUNCTION PANEL
Albumin: 2.4 g/dL — ABNORMAL LOW (ref 3.5–5.0)
Anion gap: 6 (ref 5–15)
BUN: 26 mg/dL — ABNORMAL HIGH (ref 8–23)
CO2: 29 mmol/L (ref 22–32)
Calcium: 7.8 mg/dL — ABNORMAL LOW (ref 8.9–10.3)
Chloride: 108 mmol/L (ref 98–111)
Creatinine, Ser: 0.67 mg/dL (ref 0.61–1.24)
GFR, Estimated: >60 mL/min (ref >60)
Glucose, Bld: 97 mg/dL (ref 70–99)
Phosphorus: 2.3 mg/dL — ABNORMAL LOW (ref 2.5–4.6)
Potassium: 3.1 mmol/L — ABNORMAL LOW (ref 3.5–5.1)
Sodium: 143 mmol/L (ref 135–145)

## 2023-10-19 LAB — MAGNESIUM: Magnesium: 2.2 mg/dL (ref 1.7–2.4)

## 2023-10-19 MED ORDER — FUROSEMIDE 10 MG/ML IJ SOLN
40.0000 mg | Freq: Once | INTRAMUSCULAR | Status: AC
  Administered 2023-10-19: 40 mg via INTRAVENOUS
  Filled 2023-10-19: qty 4

## 2023-10-19 MED ORDER — ADULT MULTIVITAMIN W/MINERALS CH
1.0000 | ORAL_TABLET | Freq: Every day | ORAL | Status: DC
Start: 2023-10-19 — End: 2023-10-20
  Administered 2023-10-19 – 2023-10-20 (×2): 1 via ORAL
  Filled 2023-10-19 (×2): qty 1

## 2023-10-19 MED ORDER — POTASSIUM CHLORIDE CRYS ER 20 MEQ PO TBCR
40.0000 meq | EXTENDED_RELEASE_TABLET | Freq: Two times a day (BID) | ORAL | Status: AC
  Administered 2023-10-19 (×2): 40 meq via ORAL
  Filled 2023-10-19 (×2): qty 2

## 2023-10-19 MED ORDER — ACETAMINOPHEN 325 MG PO TABS
650.0000 mg | ORAL_TABLET | ORAL | Status: DC | PRN
  Administered 2023-10-19 – 2023-10-20 (×6): 650 mg via ORAL
  Filled 2023-10-19 (×5): qty 2

## 2023-10-19 MED ORDER — ENSURE MAX PROTEIN PO LIQD
11.0000 [oz_av] | Freq: Two times a day (BID) | ORAL | Status: DC
  Administered 2023-10-19 – 2023-10-20 (×2): 11 [oz_av] via ORAL
  Filled 2023-10-19 (×3): qty 330

## 2023-10-19 MED ORDER — OXYCODONE HCL 5 MG PO TABS
5.0000 mg | ORAL_TABLET | ORAL | Status: DC | PRN
  Administered 2023-10-19 – 2023-10-20 (×6): 10 mg via ORAL
  Filled 2023-10-19: qty 2
  Filled 2023-10-19: qty 1
  Filled 2023-10-19 (×5): qty 2

## 2023-10-19 MED ORDER — POTASSIUM PHOSPHATES 15 MMOLE/5ML IV SOLN
15.0000 mmol | Freq: Once | INTRAVENOUS | Status: AC
  Administered 2023-10-19: 15 mmol via INTRAVENOUS
  Filled 2023-10-19: qty 5

## 2023-10-19 NOTE — Progress Notes (Signed)
 Initial Nutrition Assessment  DOCUMENTATION CODES:   Non-severe (moderate) malnutrition in context of chronic illness  INTERVENTION:  Multivitamin w/ minerals daily Ensure Max po BID, each supplement provides 150 kcal and 30 grams of protein.  Encourage good PO intake   NUTRITION DIAGNOSIS:   Moderate Malnutrition related to chronic illness as evidenced by percent weight loss, mild muscle depletion.  GOAL:   Patient will meet greater than or equal to 90% of their needs  MONITOR:   PO intake, Supplement acceptance, I & O's, Labs, Weight trends  REASON FOR ASSESSMENT:   Consult Assessment of nutrition requirement/status  ASSESSMENT:   78 y.o. male presented to the ED with increased respiratory distress. PMH includes multiple myeloma s/p chemotherapy, CAD, HTN, HLD, and A. Fib. Pt admitted with acute on chronic respiratory failure and sepsis 2/2 PNA.   5/16 - Admitted  Met with pt and wife in room. Reports that appetite has improved, shares that he has not been eating great at home 2/2 poor appetite. Shares that his wife will prepare all meals or they will bring something home and he often does not feel like eating. Decrease appetite started after he got sick. Denies any nausea or vomiting.  Has been drinking Jocko Protein Shakes at home. Has been drinking these instead of Ensure because they have less sugar in them. RD explained that sometimes an Ensure may be needed to help with overall caloric intake as they are higher in calories as well. Discussed using oral nutrition supplements while in the hospital, pt agreeable to drinking Ensure Max that is similar to protein shake he was drinking at home.    Pt reports a UBW of 260# and has significant weight loss. Does not provide exact time frame. Per EMR, pt with a 17.8 kg or 16% weight loss within the past 5 months, this is clinically significant for time frame.   Meal Intake 5/17: 20% dinner 5/18: 25% lunch 5/19: 100% breakfast,  100% lunch  Admission Weight: 91.6 kg  Current Weight: 93.6 kg (5/19)  Nutrition Related Medications: Protonix , Potassium Chloride , Prednisone , IV antibiotics, IV potassium phosphate  Labs: Sodium 143, Potassium 3.1, BUN 26, Creatinine 0.67, Phosphorus 2.3, Magnesium  2.2, Hgb A1c 5.2  NUTRITION - FOCUSED PHYSICAL EXAM:  Flowsheet Row Most Recent Value  Orbital Region No depletion  Upper Arm Region No depletion  Thoracic and Lumbar Region No depletion  Buccal Region Mild depletion  Temple Region No depletion  Clavicle Bone Region Mild depletion  Clavicle and Acromion Bone Region Mild depletion  Scapular Bone Region Mild depletion  Dorsal Hand No depletion  Patellar Region No depletion  Anterior Thigh Region No depletion  Posterior Calf Region No depletion  Edema (RD Assessment) None  Hair Reviewed  Eyes Reviewed  Mouth Reviewed  Skin Reviewed  Nails Reviewed   Diet Order:   Diet Order             Diet regular Room service appropriate? Yes; Fluid consistency: Thin  Diet effective now                   EDUCATION NEEDS:   Education needs have been addressed  Skin:  Skin Assessment: Skin Integrity Issues: Skin Integrity Issues:: Stage I Stage I: R Buttocks  Last BM:  5/20  Height:  Ht Readings from Last 1 Encounters:  10/15/23 5\' 10"  (1.778 m)   Weight:  Wt Readings from Last 1 Encounters:  10/18/23 93.6 kg   Ideal Body Weight:  75.5 kg  BMI:  Body mass index is 29.61 kg/m.  Estimated Nutritional Needs:  Kcal:  2000-2200 Protein:  100-120 grams Fluid:  >/= 2 L   Doneta Furbish RD, LDN Clinical Dietitian

## 2023-10-19 NOTE — Progress Notes (Signed)
 PROGRESS NOTE  Daniel Whitaker AVW:[id number] DOB: 1945-07-25   PCP: Glena Landau, MD  Patient is from: Home.  DOA: 10/15/2023 LOS: 4  Chief complaints Chief Complaint  Patient presents with   Respiratory Distress     Brief Narrative / Interim history: 78 year old M with PMH of OSA on CPAP, chronic hypoxic RF, kyphosis, MM on second line chemo, PAF on Eliquis , chronic pain, neuropathy and restless leg syndrome presented to ED with increased respiratory distress for about 2 weeks, and admitted to ICU with acute on chronic respiratory failure with hypoxia in the setting of suspected hospital-acquired pneumonia.   In ED, tachycardic to 120s and tachypneic to 26.  Febrile to 100.8.  No leukocytosis.  CMP without significant finding.  Lactic acid was negative.  Procalcitonin elevated.  BNP 100.5.  COVID-19, influenza and RSV PCR nonreactive.  CXR with patchy and confluent airspace disease in both upper lungs concerning for multifocal pneumonia, and chronic interstitial coarsening.  ABG without significant finding.  Patient was started on BiPAP and broad-spectrum antibiotics and admitted to ICU.  Patient was weaned off BiPAP to 12 L by HFNC, and transferred to hospitalist service on 5/18.  CT angio chest negative for PE but new small bilateral pleural effusions and nonspecific new multifocal GGO opacities throughout both lungs in the upper and midlung predominance which could be infectious, inflammatory or edema.  Oxygen requirement improved.  On cefepime  and prednisone  taper.  Intermittently diuresing as well..   Subjective: Seen and examined earlier this morning.  No major events overnight of this morning.  No complaints.  No further significant cough.  Denies chest pain or shortness of breath.  Had about 4.3 L UOP after IV Lasix  yesterday.  Oxygen requirement improved.  Currently saturating low 90s on 4 L at rest.  Required up to 8 L with ambulation yesterday.  Spiked mild fever to 100.7  later in the morning.  Objective: Vitals:   10/19/23 0718 10/19/23 0800 10/19/23 1125 10/19/23 1213  BP:  (!) 156/69    Pulse:  81    Resp:  (!) 25    Temp: 97.9 F (36.6 C)  (!) 100.7 F (38.2 C) 98.7 F (37.1 C)  TempSrc: Oral  Axillary Oral  SpO2:  90%    Weight:      Height:        Examination:  GENERAL: No apparent distress.  Nontoxic.  Sitting on bedside chair. HEENT: MMM.  Vision and hearing grossly intact.  NECK: Supple.  No apparent JVD.  RESP:  No IWOB.   Kyphosis. CVS:  RRR. Heart sounds normal.  ABD/GI/GU: BS+. Abd soft, NTND.  MSK/EXT:  Moves extremities. No apparent deformity.  Trace BLE edema. SKIN: no apparent skin lesion or wound NEURO: Awake, alert and oriented appropriately.  No apparent focal neuro deficit. PSYCH: Calm. Normal affect.   CT chest on 10/17/2023   Consultants:  Critical care admitted patient  Procedures: None  Microbiology summarized: COVID-19, influenza and RSV PCR nonreactive A 20 pathogen RVP nonreactive MRSA PCR screen nonreactive. Blood cultures NGTD  Assessment and plan: Severe sepsis due to presumed HCAP: Present on arrival.  Heart tachycardia, tachypnea, fever with respiratory failure requiring BiPAP.  CT angio chest negative for PE but new small bilateral pleural effusions and nonspecific new multifocal GGO opacities throughout both lungs in the upper and midlung predominance likely infectious +/- edema versus inflammatory process.  Immunocompromised patient on chemotherapy.  Oxygen requirement improved -MRSA PCR screen negative.  Vancomycin  discontinued on  5/17 -Cefepime  5/16>>.  Will switch to Levaquin  on discharge. -IS, OOB, PT/OT  Acute on chronic respiratory failure: On 4 to 5 L at baseline but he says he was off oxygen for about a month before he started using about 2 weeks ago.  Required BiPAP in ED. Also has underlying OSA but no CO2 retention.  CT angio chest as above.  Some temporal association with Cytoxan   raising concern for pneumonitis.  Improved with antibiotics, steroid and diuretics.  Now down to 4 L at rest. -Antibiotics as above. -Changed Solu-Medrol  to prednisone  taper -Will give additional IV Lasix  40 mg x 1 today.   -Strict intake and output. -Wean oxygen as able and ambulate for saturation assessment -Incentive spirometry, OOB, PT/OT   History of multiple myeloma on second line chemotherapy -Chemotherapy on hold. -Continue acyclovir  for prophylaxis -Oncology on board   Paroxysmal atrial fibrillation -Continue anticoagulation -Telemetry monitoring -Optimize electrolytes   Chronic pain/neuropathy/RLS: Stable -Continue oxycodone  and Lyrica   OSA on CPAP -Continue CPAP at night   ACD: Hgb stable between 8 and 9.  Hypokalemia/hypophosphatemia -Monitor replenish as appropriate  Constipation: Resolved. - Bowel regimens as needed  Nutrition Body mass index is 29.61 kg/m. -Dietitian consulted.         Pressure skin injury: Present on admission Pressure Injury 10/15/23 Buttocks Right Stage 1 -  Intact skin with non-blanchable redness of a localized area usually over a bony prominence. (Active)  10/15/23 0930  Location: Buttocks  Location Orientation: Right  Staging: Stage 1 -  Intact skin with non-blanchable redness of a localized area usually over a bony prominence.  Wound Description (Comments):   Present on Admission: Yes  Dressing Type Foam - Lift dressing to assess site every shift 10/19/23 0800   DVT prophylaxis:  SCDs Start: 10/15/23 0843 apixaban  (ELIQUIS ) tablet 5 mg  Code Status: Full code Family Communication: Updated patient's wife at bedside Level of care: Progressive Status is: Inpatient Remains inpatient appropriate because: Severe sepsis, HAP and acute respiratory failure with hypoxia   Final disposition: Home with home health when medically stable for discharge   55 minutes with more than 50% spent in reviewing records, counseling  patient/family and coordinating care.   Sch Meds:  Scheduled Meds:  acyclovir   400 mg Oral Daily   apixaban   5 mg Oral BID   brimonidine   1 drop Left Eye BID   Chlorhexidine  Gluconate Cloth  6 each Topical Daily   dorzolamide -timolol   1 drop Both Eyes BID   furosemide   40 mg Intravenous Once   latanoprost   1 drop Both Eyes QHS   lidocaine   1 patch Transdermal Q24H   pantoprazole   40 mg Oral Daily   potassium chloride   40 mEq Oral BID   [START ON 10/20/2023] predniSONE   30 mg Oral Q breakfast   Followed by   Cecily Cohen ON 10/22/2023] predniSONE   20 mg Oral Q breakfast   Followed by   Cecily Cohen ON 10/24/2023] predniSONE   10 mg Oral Q breakfast   Followed by   Cecily Cohen ON 10/26/2023] predniSONE   5 mg Oral Q breakfast   pregabalin   100 mg Oral q morning   pregabalin   200 mg Oral QHS   sodium chloride  flush  10-40 mL Intracatheter Q12H   Continuous Infusions:  azithromycin  500 mg (10/19/23 1414)   ceFEPime  (MAXIPIME ) IV Stopped (10/19/23 1024)   potassium PHOSPHATE  IVPB (in mmol) 43 mL/hr at 10/19/23 1200   PRN Meds:.acetaminophen , ipratropium-albuterol , mouth rinse, oxyCODONE , polyethylene glycol, [COMPLETED] senna-docusate **FOLLOWED BY**  senna-docusate, sodium chloride  flush  Antimicrobials: Anti-infectives (From admission, onward)    Start     Dose/Rate Route Frequency Ordered Stop   10/17/23 1200  azithromycin  (ZITHROMAX ) 500 mg in sodium chloride  0.9 % 250 mL IVPB        500 mg 250 mL/hr over 60 Minutes Intravenous Every 24 hours 10/17/23 1108 10/20/23 1159   10/15/23 2000  vancomycin  (VANCOCIN ) IVPB 1000 mg/200 mL premix  Status:  Discontinued        1,000 mg 200 mL/hr over 60 Minutes Intravenous Every 12 hours 10/15/23 0940 10/16/23 1243   10/15/23 1345  acyclovir  (ZOVIRAX ) 200 MG capsule 400 mg        400 mg Oral Daily 10/15/23 1255     10/15/23 1000  ceFEPIme  (MAXIPIME ) 2 g in sodium chloride  0.9 % 100 mL IVPB        2 g 200 mL/hr over 30 Minutes Intravenous Every 8 hours  10/15/23 0940     10/15/23 0715  aztreonam  (AZACTAM ) 2 g in sodium chloride  0.9 % 100 mL IVPB        2 g 200 mL/hr over 30 Minutes Intravenous  Once 10/15/23 0705 10/15/23 0825   10/15/23 0715  metroNIDAZOLE  (FLAGYL ) IVPB 500 mg        500 mg 100 mL/hr over 60 Minutes Intravenous  Once 10/15/23 0705 10/15/23 0845   10/15/23 0715  vancomycin  (VANCOCIN ) IVPB 1000 mg/200 mL premix  Status:  Discontinued        1,000 mg 200 mL/hr over 60 Minutes Intravenous  Once 10/15/23 0705 10/15/23 0706   10/15/23 0715  vancomycin  (VANCOREADY) IVPB 1500 mg/300 mL        1,500 mg 150 mL/hr over 120 Minutes Intravenous  Once 10/15/23 0706 10/15/23 1038        I have personally reviewed the following labs and images: CBC: Recent Labs  Lab 10/14/23 1344 10/14/23 1344 10/15/23 0722 10/15/23 0733 10/15/23 0908 10/15/23 0923 10/16/23 0300 10/17/23 0355 10/18/23 0420 10/19/23 0535  WBC 5.3   < > 9.0  --  7.8  --  6.0 7.3 6.3 6.8  NEUTROABS 3.8  --  7.7  --   --   --   --   --   --   --   HGB 9.1*  --  10.0*   < > 9.7* 8.8* 8.4* 8.9* 8.5* 8.6*  HCT 27.9*  --  30.9*   < > 30.1* 26.0* 26.0* 27.9* 27.6* 27.5*  MCV 83.5  --  85.8  --  86.5  --  85.8 86.6 87.6 87.3  PLT 234   < > 261  --  227  --  191 268 266 256   < > = values in this interval not displayed.   BMP &GFR Recent Labs  Lab 10/15/23 0908 10/15/23 0923 10/16/23 0300 10/17/23 0355 10/18/23 0420 10/19/23 0535  NA 138 140 139 141 142 143  K 3.6 3.3* 4.2 4.3 4.2 3.1*  CL 107  --  108 108 110 108  CO2 22  --  23 25 26 29   GLUCOSE 148*  --  144* 144* 140* 97  BUN 25*  --  31* 25* 26* 26*  CREATININE 1.05  --  0.88 0.75 0.68 0.67  CALCIUM  8.3*  --  8.0* 8.2* 8.1* 7.8*  MG 1.8  --  1.9  --  2.3 2.2  PHOS 2.3*  --   --   --  2.4* 2.3*   Estimated  Creatinine Clearance (by C-G formula based on SCr of 0.67 mg/dL) Male: 73 mL/min Male: 88.8 mL/min Liver & Pancreas: Recent Labs  Lab 10/14/23 1344 10/15/23 0722 10/15/23 0908  10/16/23 0300 10/18/23 0420 10/19/23 0535  AST 7* 15 13* 11*  --   --   ALT 6 11 11 10   --   --   ALKPHOS 162* 150* 149* 121  --   --   BILITOT 0.4 0.4 0.7 0.4  --   --   PROT 5.7* 5.5* 4.9* 4.7*  --   --   ALBUMIN  3.4* 2.7* 2.7* 2.4* 2.5* 2.4*   No results for input(s): "LIPASE", "AMYLASE" in the last 168 hours. No results for input(s): "AMMONIA" in the last 168 hours. Diabetic: No results for input(s): "HGBA1C" in the last 72 hours.  Recent Labs  Lab 10/15/23 0948 10/15/23 1107 10/15/23 1506 10/15/23 1942  GLUCAP 146* 155* 212* 149*   Cardiac Enzymes: No results for input(s): "CKTOTAL", "CKMB", "CKMBINDEX", "TROPONINI" in the last 168 hours. No results for input(s): "PROBNP" in the last 8760 hours. Coagulation Profile: Recent Labs  Lab 10/15/23 0722  INR 1.5*   Thyroid  Function Tests: No results for input(s): "TSH", "T4TOTAL", "FREET4", "T3FREE", "THYROIDAB" in the last 72 hours. Lipid Profile: No results for input(s): "CHOL", "HDL", "LDLCALC", "TRIG", "CHOLHDL", "LDLDIRECT" in the last 72 hours. Anemia Panel: No results for input(s): "VITAMINB12", "FOLATE", "FERRITIN", "TIBC", "IRON", "RETICCTPCT" in the last 72 hours. Urine analysis:    Component Value Date/Time   COLORURINE STRAW (A) 08/29/2023 1336   APPEARANCEUR HAZY (A) 08/29/2023 1336   LABSPEC 1.006 08/29/2023 1336   PHURINE 6.0 08/29/2023 1336   GLUCOSEU NEGATIVE 08/29/2023 1336   HGBUR SMALL (A) 08/29/2023 1336   BILIRUBINUR NEGATIVE 08/29/2023 1336   KETONESUR NEGATIVE 08/29/2023 1336   PROTEINUR NEGATIVE 08/29/2023 1336   UROBILINOGEN 1.0 04/15/2013 2256   NITRITE NEGATIVE 08/29/2023 1336   LEUKOCYTESUR MODERATE (A) 08/29/2023 1336   Sepsis Labs: Invalid input(s): "PROCALCITONIN", "LACTICIDVEN"  Microbiology: Recent Results (from the past 240 hours)  Resp panel by RT-PCR (RSV, Flu A&B, Covid) Anterior Nasal Swab     Status: None   Collection Time: 10/15/23  6:56 AM   Specimen: Anterior Nasal  Swab  Result Value Ref Range Status   SARS Coronavirus 2 by RT PCR NEGATIVE NEGATIVE Final   Influenza A by PCR NEGATIVE NEGATIVE Final   Influenza B by PCR NEGATIVE NEGATIVE Final    Comment: (NOTE) The Xpert Xpress SARS-CoV-2/FLU/RSV plus assay is intended as an aid in the diagnosis of influenza from Nasopharyngeal swab specimens and should not be used as a sole basis for treatment. Nasal washings and aspirates are unacceptable for Xpert Xpress SARS-CoV-2/FLU/RSV testing.  Fact Sheet for Patients: BloggerCourse.com  Fact Sheet for Healthcare Providers: SeriousBroker.it  This test is not yet approved or cleared by the [place]  FDA and has been authorized for detection and/or diagnosis of SARS-CoV-2 by FDA under an Emergency Use Authorization (EUA). This EUA will remain in effect (meaning this test can be used) for the duration of the COVID-19 declaration under Section 564(b)(1) of the Act, 21 U.S.C. section 360bbb-3(b)(1), unless the authorization is terminated or revoked.     Resp Syncytial Virus by PCR NEGATIVE NEGATIVE Final    Comment: (NOTE) Fact Sheet for Patients: BloggerCourse.com  Fact Sheet for Healthcare Providers: SeriousBroker.it  This test is not yet approved or cleared by the [place]  FDA and has been authorized for detection and/or diagnosis of SARS-CoV-2 by  FDA under an Emergency Use Authorization (EUA). This EUA will remain in effect (meaning this test can be used) for the duration of the COVID-19 declaration under Section 564(b)(1) of the Act, 21 U.S.C. section 360bbb-3(b)(1), unless the authorization is terminated or revoked.  Performed at [facility], [address] [place], [place] [address]   Blood Culture (routine x 2)     Status: None (Preliminary result)   Collection Time: 10/15/23  7:05 AM   Specimen: BLOOD LEFT WRIST  Result  Value Ref Range Status   Specimen Description BLOOD LEFT WRIST  Final   Special Requests   Final    BOTTLES DRAWN AEROBIC AND ANAEROBIC Blood Culture adequate volume   Culture   Final    NO GROWTH 4 DAYS Performed at [facility], [address] [place], [place] [address]    Report Status PENDING  Incomplete  Blood Culture (routine x 2)     Status: None (Preliminary result)   Collection Time: 10/15/23  7:10 AM   Specimen: BLOOD  Result Value Ref Range Status   Specimen Description BLOOD SITE NOT SPECIFIED  Final   Special Requests   Final    BOTTLES DRAWN AEROBIC ONLY Blood Culture adequate volume   Culture   Final    NO GROWTH 4 DAYS Performed at [facility], [address] [place], [place] [address]    Report Status PENDING  Incomplete  MRSA Next Gen by PCR, Nasal     Status: None   Collection Time: 10/15/23  8:42 AM   Specimen: Nasal Mucosa; Nasal Swab  Result Value Ref Range Status   MRSA by PCR Next Gen NOT DETECTED NOT DETECTED Final    Comment: (NOTE) The GeneXpert MRSA Assay (FDA approved for NASAL specimens only), is one component of a comprehensive MRSA colonization surveillance program. It is not intended to diagnose MRSA infection nor to guide or monitor treatment for MRSA infections. Test performance is not FDA approved in patients less than 55 years old. Performed at [facility], [address], [place], [place] [address]   Respiratory (~20 pathogens) panel by PCR     Status: None   Collection Time: 10/15/23 12:51 PM   Specimen: Nasopharyngeal Swab; Respiratory  Result Value Ref Range Status   Adenovirus NOT DETECTED NOT DETECTED Final   Coronavirus 229E NOT DETECTED NOT DETECTED Final    Comment: (NOTE) The Coronavirus on the Respiratory Panel, DOES NOT test for the novel  Coronavirus (2019 nCoV)    Coronavirus HKU1 NOT DETECTED NOT DETECTED Final   Coronavirus NL63 NOT DETECTED NOT DETECTED Final   Coronavirus OC43 NOT DETECTED NOT  DETECTED Final   Metapneumovirus NOT DETECTED NOT DETECTED Final   Rhinovirus / Enterovirus NOT DETECTED NOT DETECTED Final   Influenza A NOT DETECTED NOT DETECTED Final   Influenza B NOT DETECTED NOT DETECTED Final   Parainfluenza Virus 1 NOT DETECTED NOT DETECTED Final   Parainfluenza Virus 2 NOT DETECTED NOT DETECTED Final   Parainfluenza Virus 3 NOT DETECTED NOT DETECTED Final   Parainfluenza Virus 4 NOT DETECTED NOT DETECTED Final   Respiratory Syncytial Virus NOT DETECTED NOT DETECTED Final   Bordetella pertussis NOT DETECTED NOT DETECTED Final   Bordetella Parapertussis NOT DETECTED NOT DETECTED Final   Chlamydophila pneumoniae NOT DETECTED NOT DETECTED Final   Mycoplasma pneumoniae NOT DETECTED NOT DETECTED Final    Comment: Performed at [facility], [address] [place],  [place] [address]    Radiology Studies: No results found.     Renette Hsu T. Amyra Vantuyl [facility]  If 7PM-7AM, please contact night-coverage [email or web address] 10/19/2023, 2:48 PM

## 2023-10-19 NOTE — Progress Notes (Signed)
   10/19/23 2335  BiPAP/CPAP/SIPAP  Reason BIPAP/CPAP not in use Other(comment) (pt refused at this time.)   Pt to call if he changes mind. No distress on 4L nasal cannula at this time. SpO2 94%. RT will continue to be available as needed.

## 2023-10-19 NOTE — Progress Notes (Addendum)
 [facility] night cross cover note:   Per patient's request, I have modified his existing order for prn oxycodone  from every 6 hours prn to his home frequency of every 4 hours prn, with the patient noting breakthrough discomfort 5 hours after his most recent dose of prn oxycodone .  Additionally, the patient requests resumption of his previous order for acetaminophen  650 mg p.o. every 4 hours as needed, which is now expired.  I subsequently renewed the latter acetaminophen  order.    Camelia Cavalier, DO Hospitalist

## 2023-10-19 NOTE — Progress Notes (Signed)
 Physical Therapy Treatment Patient Details Name: Daniel Whitaker MRN: [id number] DOB: 1946/01/03 Today's Date: 10/19/2023   History of Present Illness Pt is a 78 y.o. male admitted 5/16 with respiratory failure. Initially requiring bipap but transitioned to HFNC 5/17. Pt with mulitple recent hospitalizations. Dx with multiple myeloma in 2024 and currently undergoing chemotherapy. PMH: a fib, MGUS, cataracts, glaucoma, obesity, RLS, spinal stenosis, neuropathy, chronic rib fxs    PT Comments  Pt received in chair, agreeable to therapy session after recently mobilizing to/from bathroom with RN assist. Pt performed community distance gait trial with rollator and SpO2 91% and above on 4L HF [place], improves to >95% on 4L when resting in chair. Emphasis on posture and exercises to promote upright posture to improve lung expansion. Pt continues to benefit from PT services to progress toward functional mobility goals, continue to recommend HHPT (vs OPPT if pt prefers).     If plan is discharge home, recommend the following: A little help with walking and/or transfers;A little help with bathing/dressing/bathroom;Help with stairs or ramp for entrance;Assistance with cooking/housework;Assist for transportation   Can travel by private vehicle        Equipment Recommendations  Wheelchair (measurements PT);Wheelchair cushion (measurements PT)    Recommendations for Other Services       Precautions / Restrictions Precautions Precautions: Fall;Other (comment) Recall of Precautions/Restrictions: Intact Precaution/Restrictions Comments: chronic rib fxs, watch sats R chest port Restrictions Weight Bearing Restrictions Per Provider Order: No     Mobility  Bed Mobility               General bed mobility comments: received in chair    Transfers Overall transfer level: Needs assistance Equipment used: Rollator (4 wheels) Transfers: Sit to/from Stand, Bed to chair/wheelchair/BSC Sit to Stand:  Contact guard assist           General transfer comment: From and back to chair, pt able to recall use of brakes wtihout cues. SpO2 WFL on 4L HF [place]    Ambulation/Gait Ambulation/Gait assistance: Contact guard assist Gait Distance (Feet): 450 Feet Assistive device: Rollator (4 wheels) Gait Pattern/deviations: Step-through pattern, Narrow base of support       General Gait Details: min cues for wider BOS and neck/chest posture to promote lung expansion, pt tends to forward head/rounded shoulder posture at baseline.   Stairs Stairs:  (pt has a ramp)           Wheelchair Mobility     Tilt Bed    Modified Rankin (Stroke Patients Only)       Balance Overall balance assessment: Needs assistance Sitting-balance support: Feet supported, No upper extremity supported Sitting balance-Leahy Scale: Good     Standing balance support: Bilateral upper extremity supported, During functional activity, Reliant on assistive device for balance Standing balance-Leahy Scale: Poor Standing balance comment: appears reliant on RW support                            Communication Communication Communication: No apparent difficulties  Cognition Arousal: Alert Behavior During Therapy: WFL for tasks assessed/performed   PT - Cognitive impairments: No apparent impairments                         Following commands: Intact      Cueing Cueing Techniques: Verbal cues  Exercises Other Exercises Other Exercises: reviewed seated vs standing postural exercises: shoulder rolls, chin tucks and scapular retraction x5-10 reps  a few times a day, pt performed a few reps for teachback    General Comments General comments (skin integrity, edema, etc.): dressing over sacrum but RN peeled it back to check and no visible irritation or skin breakdown observed      Pertinent Vitals/Pain Pain Assessment Pain Assessment: No/denies pain    Home Living                           Prior Function            PT Goals (current goals can now be found in the care plan section) Acute Rehab PT Goals Patient Stated Goal: home PT Goal Formulation: With patient Time For Goal Achievement: 10/31/23 Progress towards PT goals: Progressing toward goals    Frequency    Min 2X/week      PT Plan      Co-evaluation              AM-PAC PT "6 Clicks" Mobility   Outcome Measure  Help needed turning from your back to your side while in a flat bed without using bedrails?: A Little Help needed moving from lying on your back to sitting on the side of a flat bed without using bedrails?: A Little Help needed moving to and from a bed to a chair (including a wheelchair)?: A Little Help needed standing up from a chair using your arms (e.g., wheelchair or bedside chair)?: A Little Help needed to walk in hospital room?: A Little Help needed climbing 3-5 steps with a railing? : A Little 6 Click Score: 18    End of Session Equipment Utilized During Treatment: Oxygen Activity Tolerance: Patient tolerated treatment well Patient left: in chair;with call bell/phone within reach;with family/visitor present (spouse in room) Nurse Communication: Mobility status PT Visit Diagnosis: Difficulty in walking, not elsewhere classified (R26.2)     Time: 1610-9604 PT Time Calculation (min) (ACUTE ONLY): 18 min  Charges:    $Gait Training: 8-22 mins PT General Charges $$ ACUTE PT VISIT: 1 Visit                     Gisele Pack P., PTA Acute Rehabilitation Services Secure Chat Preferred 9a-5:30pm Office: [phone]    Mariel Shope [facility] 10/19/2023, 6:09 PM

## 2023-10-20 ENCOUNTER — Other Ambulatory Visit (HOSPITAL_COMMUNITY): Payer: Self-pay

## 2023-10-20 ENCOUNTER — Other Ambulatory Visit: Payer: Self-pay | Admitting: Student

## 2023-10-20 DIAGNOSIS — J189 Pneumonia, unspecified organism: Secondary | ICD-10-CM | POA: Diagnosis not present

## 2023-10-20 DIAGNOSIS — I480 Paroxysmal atrial fibrillation: Secondary | ICD-10-CM

## 2023-10-20 DIAGNOSIS — D638 Anemia in other chronic diseases classified elsewhere: Secondary | ICD-10-CM

## 2023-10-20 DIAGNOSIS — E440 Moderate protein-calorie malnutrition: Secondary | ICD-10-CM | POA: Insufficient documentation

## 2023-10-20 DIAGNOSIS — C9000 Multiple myeloma not having achieved remission: Secondary | ICD-10-CM | POA: Diagnosis not present

## 2023-10-20 DIAGNOSIS — D61818 Other pancytopenia: Secondary | ICD-10-CM

## 2023-10-20 DIAGNOSIS — A419 Sepsis, unspecified organism: Secondary | ICD-10-CM

## 2023-10-20 DIAGNOSIS — I10 Essential (primary) hypertension: Secondary | ICD-10-CM

## 2023-10-20 DIAGNOSIS — J9621 Acute and chronic respiratory failure with hypoxia: Secondary | ICD-10-CM | POA: Diagnosis not present

## 2023-10-20 LAB — CULTURE, BLOOD (ROUTINE X 2)
Culture: NO GROWTH
Culture: NO GROWTH
Special Requests: ADEQUATE
Special Requests: ADEQUATE

## 2023-10-20 LAB — HEMOGLOBIN AND HEMATOCRIT, BLOOD
HCT: 26.6 % — ABNORMAL LOW (ref 39.0–52.0)
Hemoglobin: 8.5 g/dL — ABNORMAL LOW (ref 13.0–17.0)

## 2023-10-20 LAB — RENAL FUNCTION PANEL
Albumin: 2.5 g/dL — ABNORMAL LOW (ref 3.5–5.0)
Anion gap: 9 (ref 5–15)
BUN: 27 mg/dL — ABNORMAL HIGH (ref 8–23)
CO2: 26 mmol/L (ref 22–32)
Calcium: 8.1 mg/dL — ABNORMAL LOW (ref 8.9–10.3)
Chloride: 106 mmol/L (ref 98–111)
Creatinine, Ser: 0.61 mg/dL (ref 0.61–1.24)
GFR, Estimated: >60 mL/min (ref >60)
Glucose, Bld: 94 mg/dL (ref 70–99)
Phosphorus: 2.5 mg/dL (ref 2.5–4.6)
Potassium: 3.6 mmol/L (ref 3.5–5.1)
Sodium: 141 mmol/L (ref 135–145)

## 2023-10-20 LAB — MAGNESIUM: Magnesium: 2.1 mg/dL (ref 1.7–2.4)

## 2023-10-20 LAB — BRAIN NATRIURETIC PEPTIDE: B Natriuretic Peptide: 75.4 pg/mL (ref 0.0–100.0)

## 2023-10-20 MED ORDER — CARVEDILOL 3.125 MG PO TABS: 3.1250 mg | ORAL_TABLET | Freq: Two times a day (BID) | ORAL | Status: DC

## 2023-10-20 MED ORDER — POTASSIUM CHLORIDE CRYS ER 20 MEQ PO TBCR
40.0000 meq | EXTENDED_RELEASE_TABLET | Freq: Once | ORAL | Status: AC
  Administered 2023-10-20: 40 meq via ORAL
  Filled 2023-10-20: qty 2

## 2023-10-20 MED ORDER — LEVOFLOXACIN 750 MG PO TABS
750.0000 mg | ORAL_TABLET | Freq: Every day | ORAL | 0 refills | Status: AC
  Filled 2023-10-20: qty 3, 3d supply, fill #0

## 2023-10-20 MED ORDER — HEPARIN SOD (PORK) LOCK FLUSH 100 UNIT/ML IV SOLN
500.0000 [IU] | INTRAVENOUS | Status: AC | PRN
  Administered 2023-10-20: 500 [IU]

## 2023-10-20 MED ORDER — PREDNISONE 5 MG PO TABS
ORAL_TABLET | ORAL | 0 refills | Status: DC
  Filled 2023-10-20: qty 26, 8d supply, fill #0

## 2023-10-20 NOTE — Progress Notes (Signed)
 Patient and wife linda  verbalized understanding of dc instructions. All belongings and paperwork given to patient.

## 2023-10-20 NOTE — Progress Notes (Signed)
 Discontinued his carvedilol  on discharge but realized that he is taking carvedilol  for his atrial fibrillation, not hypertension.  I called patient and advised him to continue taking his Coreg .  Patient voiced understanding and agreed to do so.  He appreciated the call as well.  I added Coreg  to his medication list but did not send new prescription since he is already on it before hospitalization.

## 2023-10-20 NOTE — Discharge Summary (Signed)
 Physician Discharge Summary  Daniel Whitaker UXL:[id number] DOB: [birth year] DOA: 10/15/2023  PCP: Glena Landau, MD  Admit date: 10/15/2023 Discharge date: 10/20/23  Admitted From: Home Disposition: Home Recommendations for Outpatient Follow-up:  Outpatient follow-up with PCP in 1 week Outpatient follow-up with oncology as previously planned Check BP, CMP and CBC at follow-up Please follow up on the following pending results: None  Home Health: Ambulatory referral to physical therapy.  Patient refused HH Equipment/Devices: Patient has home oxygen, 4 L by [place].  Ordered wheelchair.  Discharge Condition: Stable CODE STATUS: Full code  Follow-up Information     Glena Landau, MD. Schedule an appointment as soon as possible for a visit in 1 week(s).   Specialty: Family Medicine Contact information: [address] [place] [address] [phone]         [facility] Physical Therapy Follow up.   Why: Call to schedule your appointment Contact information: [address] [place], [place] [address]  Phone: [phone]                Hospital course 78 year old M with PMH of OSA on CPAP, chronic hypoxic RF, kyphosis, MM on second line chemo, PAF on Eliquis , chronic pain, neuropathy and restless leg syndrome presented to ED with increased respiratory distress for about 2 weeks, and admitted to ICU with acute on chronic respiratory failure with hypoxia in the setting of suspected hospital-acquired pneumonia.    In ED, tachycardic to 120s and tachypneic to 26.  Febrile to 100.8.  No leukocytosis.  CMP without significant finding.  Lactic acid was negative.  Procalcitonin elevated.  BNP 100.5.  COVID-19, influenza and RSV PCR nonreactive.  CXR with patchy and confluent airspace disease in both upper lungs concerning for multifocal pneumonia, and chronic interstitial coarsening.  ABG without significant finding.  Patient was started on BiPAP and  broad-spectrum antibiotics and admitted to ICU.   Patient was weaned off BiPAP to 12 L by [facility], and transferred to hospitalist service on 5/18.  CT angio chest negative for PE but new small bilateral pleural effusions and nonspecific new multifocal GGO opacities throughout both lungs in the upper and midlung predominance which could be infectious, inflammatory or edema.  Oxygen requirement improved with antibiotics, steroid and diuretics.  On the day of discharge. He maintained appropriate saturation with ambulation on home 4 L.  He felt well and ready to go home.  He is discharged on p.o. Levaquin  for 3 more days and prednisone  taper as below.  He is already p.o. Lasix  at home.  Discontinue low-dose Coreg .  Outpatient follow-up as above.  See individual problem list below for more.   Problems addressed during this hospitalization Severe sepsis due to presumed HCAP: Present on arrival.  Heart tachycardia, tachypnea, fever with respiratory failure requiring BiPAP.  CT angio chest negative for PE but new small bilateral pleural effusions and nonspecific new multifocal GGO opacities throughout both lungs in the upper and midlung predominance likely infectious +/- edema versus inflammatory process.  Immunocompromised patient on chemotherapy.  Oxygen requirement improved -MRSA PCR screen negative.  Vancomycin  5/16-5/17 -Cefepime  5/16>> p.o. Levaquin  5/21-5/24.  -IV Solu-Medrol  5/16>> prednisone  taper 5/20-5/28   Acute on chronic respiratory failure: On 4 to 5 L at baseline but he says he was off oxygen for about a month before he started using about 2 weeks ago.  Required BiPAP in ED. Also has underlying OSA but no CO2 retention.  CT angio chest as above.  Some  temporal association with Cytoxan  raising concern for pneumonitis.  Improved with antibiotics, steroid and diuretics.  Received IV Lasix  x 2.  Net -7 L. -Antibiotics and steroid as above -Continue home p.o. Lasix  40 mg daily - Reassess fluid and  respiratory status outpatient   History of multiple myeloma on second line chemotherapy -Evaluated by oncology in house. -Chemotherapy on hold. -Continue acyclovir  for prophylaxis -Outpatient follow-up with oncology   Paroxysmal atrial fibrillation -Continue Coreg  and Eliquis .   -Discontinued his Coreg  by mistake on discharge but called patient and advised him to continue Coreg .   Chronic pain/neuropathy/RLS: Stable -Continue oxycodone  and Lyrica    OSA on CPAP -Continue CPAP at night   ACD: Hgb stable between 8 and 9.   Hypokalemia/hypophosphatemia: Resolved.  Received p.o. KCl 40 mill equivalent before discharge   Constipation: Resolved. - Bowel regimens as needed   Moderate malnutrition Nutrition Problem: Moderate Malnutrition Etiology: chronic illness Signs/Symptoms: percent weight loss, mild muscle depletion Percent weight loss: 16 % Interventions: Premier Protein, MVI  Pressure Injury 10/15/23 Buttocks Right Stage 1 -  Intact skin with non-blanchable redness of a localized area usually over a bony prominence. (Active)  10/15/23 0930  Location: Buttocks  Location Orientation: Right  Staging: Stage 1 -  Intact skin with non-blanchable redness of a localized area usually over a bony prominence.  Wound Description (Comments):   Present on Admission: Yes  Dressing Type Foam - Lift dressing to assess site every shift 10/20/23 0800    Time spent 35 minutes  Vital signs Vitals:   10/20/23 0830 10/20/23 0845 10/20/23 0900 10/20/23 1100  BP:      Pulse: 83 86 82   Temp:    98.1 F (36.7 C)  Resp: 17 18 (!) 21   Height:      Weight:      SpO2: 97% 93% 96%   TempSrc:    Oral  BMI (Calculated):         Discharge exam  GENERAL: No apparent distress.  Nontoxic. HEENT: MMM.  Vision and hearing grossly intact.  NECK: Supple.  No apparent JVD.  RESP:  No IWOB.  Fair aeration bilaterally. CVS:  RRR. Heart sounds normal.  ABD/GI/GU: BS+. Abd soft, NTND.  MSK/EXT:   Moves extremities. No apparent deformity.  Trace BLE edema. SKIN: no apparent skin lesion or wound NEURO: Awake and alert. Oriented appropriately.  No apparent focal neuro deficit. PSYCH: Calm. Normal affect.   Discharge Instructions Discharge Instructions     Ambulatory referral to Physical Therapy   Complete by: As directed    [facility] Physical Therapy [address] [place], [place], [place] [address] Phone: [phone]   Diet - low sodium heart healthy   Complete by: As directed    Discharge instructions   Complete by: As directed    It has been a pleasure taking care of you!  You were hospitalized due to pneumonia and possible lung inflammation for which you have been treated with antibiotics and steroid..  Symptoms improved.  We are discharging you on oral antibiotics and a steroid to complete treatment course.  Please review your new medication list and the directions on your medications before you take them.  Follow-up with your primary care doctor in 1 to 2 weeks or sooner if needed.  Follow-up with your oncologist per their recommendation.   Take care,   Increase activity slowly   Complete by: As directed    No wound care   Complete by: As directed  Allergies as of 10/20/2023       Reactions   Penicillins Hives   Duloxetine  Hcl Nausea Only, Other (See Comments)   Drowsiness   Atorvastatin Other (See Comments)   Myalgia         Medication List     STOP taking these medications    carvedilol  3.125 MG tablet Commonly known as: COREG    dexamethasone  4 MG tablet Commonly known as: DECADRON    ofloxacin  0.3 % OTIC solution Commonly known as: FLOXIN        TAKE these medications    acyclovir  400 MG tablet Commonly known as: ZOVIRAX  Take 1 tablet (400 mg total) by mouth daily.   albuterol  108 (90 Base) MCG/ACT inhaler Commonly known as: VENTOLIN  HFA Inhale 1-2 puffs into the lungs every 6 (six) hours as needed for wheezing or shortness of breath.    apixaban  5 MG Tabs tablet Commonly known as: ELIQUIS  Take 1 tablet (5 mg total) by mouth 2 (two) times daily.   brimonidine  0.2 % ophthalmic solution Commonly known as: ALPHAGAN  Place 1 drop into the left eye in the morning and at bedtime.   calcium  carbonate 500 MG chewable tablet Commonly known as: TUMS - dosed in mg elemental calcium  Chew 1 tablet by mouth daily.   Centrum Silver 50+Men Tabs Take 1 tablet by mouth daily with breakfast.   cetirizine 10 MG tablet Commonly known as: ZYRTEC Take 10 mg by mouth every morning.   Denta 5000 Plus 1.1 % Crea dental cream Generic drug: sodium fluoride Place 1 Application onto teeth at bedtime.   docusate sodium  100 MG capsule Commonly known as: COLACE Take 300 mg by mouth daily.   dorzolamide -timolol  2-0.5 % ophthalmic solution Commonly known as: COSOPT  Place 1 drop into both eyes 2 (two) times daily.   furosemide  40 MG tablet Commonly known as: LASIX  Take 1 tablet (40 mg total) by mouth daily. What changed:  when to take this reasons to take this   ketoconazole 2 % cream Commonly known as: NIZORAL Apply 1 application  topically daily as needed for irritation.   latanoprost  0.005 % ophthalmic solution Commonly known as: XALATAN  Place 1 drop into both eyes at bedtime.   levofloxacin  750 MG tablet Commonly known as: Levaquin  Take 1 tablet (750 mg total) by mouth daily for 3 days.   lidocaine -prilocaine  cream Commonly known as: EMLA  Apply to affected area once What changed:  how much to take how to take this when to take this reasons to take this additional instructions   morphine  15 MG 12 hr tablet Commonly known as: MS CONTIN  Take 1 tablet (15 mg total) by mouth at bedtime. What changed:  when to take this reasons to take this   nitroGLYCERIN  0.4 MG SL tablet Commonly known as: Nitrostat  Place 1 tablet (0.4 mg total) under the tongue every 5 (five) minutes as needed. What changed: reasons to take this    ondansetron  8 MG tablet Commonly known as: Zofran  Take 1 tablet (8 mg total) by mouth every 8 (eight) hours as needed for nausea or vomiting.   oxyCODONE  15 MG immediate release tablet Commonly known as: ROXICODONE  Take 1 tablet (15 mg total) by mouth every 4 (four) hours as needed for severe pain (pain score 7-10).   pantoprazole  40 MG tablet Commonly known as: PROTONIX  Take 40 mg by mouth daily before breakfast.   polyethylene glycol 17 g packet Commonly known as: MIRALAX  / GLYCOLAX  Take 17 g by mouth 2 (two) times daily.  predniSONE  5 MG tablet Commonly known as: DELTASONE  Take 6 tablets (30 mg total) by mouth daily with breakfast for 2 days, THEN 4 tablets (20 mg total) daily with breakfast for 2 days, THEN 2 tablets (10 mg total) daily with breakfast for 2 days, THEN 1 tablet (5 mg total) daily with breakfast for 2 days. Start taking on: Oct 20, 2023   pregabalin  100 MG capsule Commonly known as: LYRICA  TAKE 1 CAPSULE BY MOUTH IN THE MORNING, AND AT NOON, AND 2 CAPSULES AT NIGHT What changed:  how much to take how to take this when to take this additional instructions   prochlorperazine  10 MG tablet Commonly known as: COMPAZINE  Take 1 tablet (10 mg total) by mouth every 6 (six) hours as needed for nausea or vomiting.   rosuvastatin  40 MG tablet Commonly known as: CRESTOR  Take 40 mg by mouth in the morning and at bedtime.   senna-docusate 8.6-50 MG tablet Commonly known as: Senokot-S Take 2 tablets by mouth 2 (two) times daily. What changed:  when to take this reasons to take this   triamcinolone  55 MCG/ACT Aero nasal inhaler Commonly known as: NASACORT  Place 1 spray into the nose daily as needed.   TYLENOL  500 MG tablet Generic drug: acetaminophen  Take 1,000 mg by mouth every 6 (six) hours as needed for mild pain (pain score 1-3) or headache.               Durable Medical Equipment  (From admission, onward)           Start     Ordered    10/20/23 0705  For home use only DME lightweight manual wheelchair with seat cushion  Once       Comments: Patient suffers from respiratory failure and generalized weakness which impairs their ability to perform daily activities like bathing, dressing, feeding, grooming, and toileting in the home.  A cane, crutch, or walker will not resolve  issue with performing activities of daily living. A wheelchair will allow patient to safely perform daily activities. Patient is not able to propel themselves in the home using a standard weight wheelchair due to arm weakness, endurance, and general weakness. Patient can self propel in the lightweight wheelchair. Length of need Lifetime. Accessories: elevating leg rests (ELRs), wheel locks, extensions and anti-tippers.   10/20/23 0705            Consultations: Pulmonology admitted patient Oncology  Procedures/Studies:   CT Angio Chest Pulmonary Embolism (PE) W or WO Contrast Result Date: 10/17/2023 CLINICAL DATA:  Acute respiratory failure. EXAM: CT ANGIOGRAPHY CHEST WITH CONTRAST TECHNIQUE: Multidetector CT imaging of the chest was performed using the standard protocol during bolus administration of intravenous contrast. Multiplanar CT image reconstructions and MIPs were obtained to evaluate the vascular anatomy. RADIATION DOSE REDUCTION: This exam was performed according to the departmental dose-optimization program which includes automated exposure control, adjustment of the mA and/or kV according to patient size and/or use of iterative reconstruction technique. CONTRAST:  75mL OMNIPAQUE  IOHEXOL  350 MG/ML SOLN COMPARISON:  PET CT 10/01/2023.  CT angiogram chest 07/26/2023. FINDINGS: Cardiovascular: Satisfactory opacification of the pulmonary arteries to the segmental level. No evidence of pulmonary embolism. Normal heart size. No pericardial effusion. There are atherosclerotic calcifications of the aorta. Right chest port catheter tip ends in the right  atrium. Mediastinum/Nodes: No enlarged mediastinal, hilar, or axillary lymph nodes. Thyroid  gland, trachea, and esophagus demonstrate no significant findings. Lungs/Pleura: There are new small bilateral pleural effusions. There are multifocal ground-glass  opacities throughout both lungs in the upper and mid lung predominance. Additionally there are new moderate size there is airspace consolidation in the bilateral upper lobes and minimally in the superior segment of the bilateral lower lobes. There is no pneumothorax. The trachea and central airways are patent. Upper Abdomen: No acute abnormality. Musculoskeletal: Diffuse osseous metastatic disease again noted. Metastatic disease has significantly increased compared to 07/26/2023. There are new subacute/healing sternal fractures in the superior sternum and mid sternum. There are new compression deformities of T3, T4 and T6 when compared to 07/26/2023. There are numerous healed bilateral rib fractures. Review of the MIP images confirms the above findings. IMPRESSION: 1. No evidence for pulmonary embolism. 2. New small bilateral pleural effusions. 3. New multifocal ground-glass opacities throughout both lungs in the upper and mid lung predominance. Findings are nonspecific and may be infectious/inflammatory or related to edema. 4. New moderate size airspace consolidation in the bilateral upper lobes and minimally in the superior segment of the bilateral lower lobes. Findings are worrisome for multifocal pneumonia. 5. Diffuse osseous metastatic disease has significantly increased compared to 07/26/2023. 6. New compression deformities of T3, T4 and T6 and new subacute/healing sternal fractures when compared to 07/26/2023. Aortic Atherosclerosis (ICD10-I70.0). Electronically Signed   By: Tyron Gallon M.D.   On: 10/17/2023 17:39   DG Chest Port 1 View Result Date: 10/16/2023 CLINICAL DATA:  Respiratory distress.  Shortness of breath. EXAM: PORTABLE CHEST 1 VIEW  COMPARISON:  10/15/2023 FINDINGS: The cardio pericardial silhouette is enlarged. Bilateral consolidative airspace disease is similar to prior compatible with pneumonia. Underlying diffuse interstitial lung disease evident. Right Port-A-Cath noted. Multiple bilateral rib fractures evident with innumerable scattered osseous lucencies compatible with reported clinical history of multiple myeloma. IMPRESSION: 1. Bilateral consolidative airspace disease compatible with pneumonia. No significant change from prior. 2. Multiple bilateral rib fractures with innumerable scattered osseous lucencies compatible with reported clinical history of multiple myeloma. Electronically Signed   By: Donnal Fusi M.D.   On: 10/16/2023 09:05   DG Chest Port 1 View Result Date: 10/15/2023 CLINICAL DATA:  Shortness of breath. EXAM: PORTABLE CHEST 1 VIEW COMPARISON:  08/29/2023 FINDINGS: Rotated film with patient's face obscuring the left apex. The cardio pericardial silhouette is enlarged. Interstitial markings are diffusely coarsened with chronic features. Patchy and confluent airspace disease is seen in both upper lungs suggesting pneumonia. Bones are diffusely demineralized with numerous bilateral rib fractures. Right Port-A-Cath again noted. Telemetry leads overlie the chest. IMPRESSION: 1. Patchy and confluent airspace disease in both upper lungs suggesting multifocal pneumonia. 2. Chronic interstitial coarsening. Electronically Signed   By: Donnal Fusi M.D.   On: 10/15/2023 07:25   NM PET Image Restage (PS) Whole Body (F-18 FDG) Result Date: 10/06/2023 CLINICAL DATA:  Subsequent treatment strategy for multiple myeloma. EXAM: NUCLEAR MEDICINE PET WHOLE BODY TECHNIQUE: 10.3 mCi F-18 FDG was injected intravenously. Full-ring PET imaging was performed from the head to foot after the radiotracer. CT data was obtained and used for attenuation correction and anatomic localization. Fasting blood glucose: 92 mg/dl COMPARISON:  FDG PET  scan 04/02/2023 FINDINGS: HEAD/NECK: No hypermetabolic activity in the scalp. No hypermetabolic cervical lymph nodes. Incidental CT findings: none CHEST: No hypermetabolic mediastinal or hilar nodes. No suspicious pulmonary nodules on the CT scan. Incidental CT findings: Bilateral small effusions. ABDOMEN/PELVIS: No abnormal hypermetabolic activity within the liver, pancreas, adrenal glands, or spleen. No hypermetabolic lymph nodes in the abdomen or pelvis. Incidental CT findings: Atherosclerotic calcification of the aorta. SKELETON: decrease in metabolic  activity associated multiple hypermetabolic lesions within the skeleton. For example lesion in the posterior RIGHT iliac bone with SUV max equal 3.9 decreased from SUV max equal 5.9. Two lesions in the central sacrum with SUV max equal 3.4 decreased from SUV max of 6.4. There is underlying lytic lesions throughout the visualized skeleton. Multiple lesions in the spine improved. For example lesion at C1 with SUV max equal 3.4 decreased from SUV max equal 7.3. There still mottled metabolic activity throughout the skeleton. There multiple pathologic rib fractures. Mild activity in the distal LEFT clavicle for example with SUV max equal 4.4 comparison SUV max equal 4.2. Incidental CT findings: none EXTREMITIES: Diffuse activity in the proximal marrow space of the femurs. Activity similar prior. Favor activity related to marrow stimulation/anemia. Incidental CT findings: none IMPRESSION: 1. Interval decrease in hypermetabolic skeletal metastasis. There does remain mild metabolic activity lesions throughout the skeleton albeit, significantly decreased from prior. 2. Extensive lytic changes throughout the axillary and appendicular skeleton. Multiple pathologic rib fractures noted. 3. No evidence of progression of disease. 4. Diffuse activity in the femurs marrow space is favored benign marrow stimulation related to anemia. Electronically Signed   By: Deboraha Fallow M.D.    On: 10/06/2023 18:28   CT TEMPORAL BONES W CONTRAST Result Date: 10/06/2023 CLINICAL DATA:  Conductive hearing loss of the left ear. EXAM: CT TEMPORAL BONES WITH CONTRAST TECHNIQUE: Axial and coronal plane CT imaging of the petrous temporal bones was performed with thin-collimation image reconstruction following intravenous contrast administration. Multiplanar CT image reconstructions were also generated. RADIATION DOSE REDUCTION: This exam was performed according to the departmental dose-optimization program which includes automated exposure control, adjustment of the mA and/or kV according to patient size and/or use of iterative reconstruction technique. CONTRAST:  75mL OMNIPAQUE  IOHEXOL  350 MG/ML SOLN COMPARISON:  CT head without contrast 08/29/2023. FINDINGS: RIGHT TEMPORAL BONE External auditory canal: Minimal debris in the external auditory canal likely represents cerumen. The external auditory canal is otherwise clear. The tympanic membrane is within normal limits. Middle ear cavity: Normally aerated. The scutum and ossicles are normal. The tegmen tympani is intact. Inner ear structures: The cochlea, vestibule and semicircular canals are normal. The vestibular aqueduct is not enlarged. Internal auditory and facial nerve canals:  Normal Mastoid air cells: Minimal fluid is present within inferior mastoid air cells. No obstructing nasopharyngeal lesion is present. The mastoid air cells are otherwise clear. LEFT TEMPORAL BONE External auditory canal: Normal. Middle ear cavity: Fluid or soft tissue fills the right middle ear cavity. The incus and malleus are less well-defined than on the right, suggesting potential early erosive changes. The scutum is intact. The epitympanum is opacified. Inner ear structures: The cochlea, vestibule and semicircular canals are normal. The vestibular aqueduct is not enlarged. Internal auditory and facial nerve canals:  Normal. Mastoid air cells: The mastoid air cells are near  completely opacified. No erosive changes are present. Vascular: Normal non-contrast appearance of the carotid canals, jugular bulbs and sigmoid plates. Limited intracranial:  Within normal limits. Visible orbits/paranasal sinuses: Bilateral lens replacements are noted. Globes and orbits are otherwise unremarkable. Soft tissues: The extracranial soft tissues are within normal limits. No pathologic enhancement is present. IMPRESSION: 1. Fluid or soft tissue fills the left middle ear cavity. The incus and malleus are less well-defined than on the right, suggesting potential early erosive changes. This is most consistent with a middle ear effusion. It is concerning for early changes of acquired cholesteatoma. 2. Near complete opacification of the left  mastoid air cells without erosive changes. 3. Minimal fluid within inferior right mastoid air cells. No obstructing nasopharyngeal lesion is present. 4. Otherwise normal right temporal bone. Electronically Signed   By: Audree Leas M.D.   On: 10/06/2023 06:27       The results of significant diagnostics from this hospitalization (including imaging, microbiology, ancillary and laboratory) are listed below for reference.     Microbiology: Recent Results (from the past 240 hours)  Resp panel by RT-PCR (RSV, Flu A&B, Covid) Anterior Nasal Swab     Status: None   Collection Time: 10/15/23  6:56 AM   Specimen: Anterior Nasal Swab  Result Value Ref Range Status   SARS Coronavirus 2 by RT PCR NEGATIVE NEGATIVE Final   Influenza A by PCR NEGATIVE NEGATIVE Final   Influenza B by PCR NEGATIVE NEGATIVE Final    Comment: (NOTE) The Xpert Xpress SARS-CoV-2/FLU/RSV plus assay is intended as an aid in the diagnosis of influenza from Nasopharyngeal swab specimens and should not be used as a sole basis for treatment. Nasal washings and aspirates are unacceptable for Xpert Xpress SARS-CoV-2/FLU/RSV testing.  Fact Sheet for  Patients: BloggerCourse.com  Fact Sheet for Healthcare Providers: SeriousBroker.it  This test is not yet approved or cleared by the [place]  FDA and has been authorized for detection and/or diagnosis of SARS-CoV-2 by FDA under an Emergency Use Authorization (EUA). This EUA will remain in effect (meaning this test can be used) for the duration of the COVID-19 declaration under Section 564(b)(1) of the Act, 21 U.S.C. section 360bbb-3(b)(1), unless the authorization is terminated or revoked.     Resp Syncytial Virus by PCR NEGATIVE NEGATIVE Final    Comment: (NOTE) Fact Sheet for Patients: BloggerCourse.com  Fact Sheet for Healthcare Providers: SeriousBroker.it  This test is not yet approved or cleared by the [place]  FDA and has been authorized for detection and/or diagnosis of SARS-CoV-2 by FDA under an Emergency Use Authorization (EUA). This EUA will remain in effect (meaning this test can be used) for the duration of the COVID-19 declaration under Section 564(b)(1) of the Act, 21 U.S.C. section 360bbb-3(b)(1), unless the authorization is terminated or revoked.  Performed at [facility], [address], [place], [place] [address]   Blood Culture (routine x 2)     Status: None   Collection Time: 10/15/23  7:05 AM   Specimen: BLOOD LEFT WRIST  Result Value Ref Range Status   Specimen Description BLOOD LEFT WRIST  Final   Special Requests   Final    BOTTLES DRAWN AEROBIC AND ANAEROBIC Blood Culture adequate volume   Culture   Final    NO GROWTH 5 DAYS Performed at [facility], [address] [place], [place] [address]    Report Status 10/20/2023 FINAL  Final  Blood Culture (routine x 2)     Status: None   Collection Time: 10/15/23  7:10 AM   Specimen: BLOOD  Result Value Ref Range Status   Specimen Description BLOOD SITE NOT SPECIFIED  Final    Special Requests   Final    BOTTLES DRAWN AEROBIC ONLY Blood Culture adequate volume   Culture   Final    NO GROWTH 5 DAYS Performed at [facility], [address] [place], [place] [address]    Report Status 10/20/2023 FINAL  Final  MRSA Next Gen by PCR, Nasal     Status: None   Collection Time: 10/15/23  8:42 AM   Specimen: Nasal  Mucosa; Nasal Swab  Result Value Ref Range Status   MRSA by PCR Next Gen NOT DETECTED NOT DETECTED Final    Comment: (NOTE) The GeneXpert MRSA Assay (FDA approved for NASAL specimens only), is one component of a comprehensive MRSA colonization surveillance program. It is not intended to diagnose MRSA infection nor to guide or monitor treatment for MRSA infections. Test performance is not FDA approved in patients less than 48 years old. Performed at [facility], [address], [place], [place] [address]   Respiratory (~20 pathogens) panel by PCR     Status: None   Collection Time: 10/15/23 12:51 PM   Specimen: Nasopharyngeal Swab; Respiratory  Result Value Ref Range Status   Adenovirus NOT DETECTED NOT DETECTED Final   Coronavirus 229E NOT DETECTED NOT DETECTED Final    Comment: (NOTE) The Coronavirus on the Respiratory Panel, DOES NOT test for the novel  Coronavirus (2019 nCoV)    Coronavirus HKU1 NOT DETECTED NOT DETECTED Final   Coronavirus NL63 NOT DETECTED NOT DETECTED Final   Coronavirus OC43 NOT DETECTED NOT DETECTED Final   Metapneumovirus NOT DETECTED NOT DETECTED Final   Rhinovirus / Enterovirus NOT DETECTED NOT DETECTED Final   Influenza A NOT DETECTED NOT DETECTED Final   Influenza B NOT DETECTED NOT DETECTED Final   Parainfluenza Virus 1 NOT DETECTED NOT DETECTED Final   Parainfluenza Virus 2 NOT DETECTED NOT DETECTED Final   Parainfluenza Virus 3 NOT DETECTED NOT DETECTED Final   Parainfluenza Virus 4 NOT DETECTED NOT DETECTED Final   Respiratory Syncytial Virus NOT DETECTED NOT DETECTED Final   Bordetella pertussis  NOT DETECTED NOT DETECTED Final   Bordetella Parapertussis NOT DETECTED NOT DETECTED Final   Chlamydophila pneumoniae NOT DETECTED NOT DETECTED Final   Mycoplasma pneumoniae NOT DETECTED NOT DETECTED Final    Comment: Performed at [facility], [address] [place], [place] [address]     Labs:  CBC: Recent Labs  Lab 10/14/23 1344 10/14/23 1344 10/15/23 0722 10/15/23 0733 10/15/23 0908 10/15/23 0923 10/16/23 0300 10/17/23 0355 10/18/23 0420 10/19/23 0535 10/20/23 0415  WBC 5.3   < > 9.0  --  7.8  --  6.0 7.3 6.3 6.8  --   NEUTROABS 3.8  --  7.7  --   --   --   --   --   --   --   --   HGB 9.1*  --  10.0*   < > 9.7*   < > 8.4* 8.9* 8.5* 8.6* 8.5*  HCT 27.9*  --  30.9*   < > 30.1*   < > 26.0* 27.9* 27.6* 27.5* 26.6*  MCV 83.5  --  85.8  --  86.5  --  85.8 86.6 87.6 87.3  --   PLT 234   < > 261  --  227  --  191 268 266 256  --    < > = values in this interval not displayed.   BMP &GFR Recent Labs  Lab 10/15/23 0908 10/15/23 0923 10/16/23 0300 10/17/23 0355 10/18/23 0420 10/19/23 0535 10/20/23 0415  NA 138   < > 139 141 142 143 141  K 3.6   < > 4.2 4.3 4.2 3.1* 3.6  CL 107  --  108 108 110 108 106  CO2 22  --  23 25 26 29 26   GLUCOSE 148*  --  144* 144* 140* 97 94  BUN 25*  --  31* 25* 26* 26* 27*  CREATININE 1.05  --  0.88 0.75 0.68 0.67 0.61  CALCIUM  8.3*  --  8.0* 8.2* 8.1* 7.8* 8.1*  MG 1.8  --  1.9  --  2.3 2.2 2.1  PHOS 2.3*  --   --   --  2.4* 2.3* 2.5   < > = values in this interval not displayed.   Estimated Creatinine Clearance (by C-G formula based on SCr of 0.61 mg/dL) Male: 16.1 mL/min Male: 79.8 mL/min Liver & Pancreas: Recent Labs  Lab 10/14/23 1344 10/15/23 0722 10/15/23 0908 10/16/23 0300 10/18/23 0420 10/19/23 0535 10/20/23 0415  AST 7* 15 13* 11*  --   --   --   ALT 6 11 11 10   --   --   --   ALKPHOS 162* 150* 149* 121  --   --   --   BILITOT 0.4 0.4 0.7 0.4  --   --   --   PROT 5.7* 5.5* 4.9* 4.7*  --   --   --    ALBUMIN  3.4* 2.7* 2.7* 2.4* 2.5* 2.4* 2.5*   No results for input(s): "LIPASE", "AMYLASE" in the last 168 hours. No results for input(s): "AMMONIA" in the last 168 hours. Diabetic: No results for input(s): "HGBA1C" in the last 72 hours. Recent Labs  Lab 10/15/23 0948 10/15/23 1107 10/15/23 1506 10/15/23 1942  GLUCAP 146* 155* 212* 149*   Cardiac Enzymes: No results for input(s): "CKTOTAL", "CKMB", "CKMBINDEX", "TROPONINI" in the last 168 hours. No results for input(s): "PROBNP" in the last 8760 hours. Coagulation Profile: Recent Labs  Lab 10/15/23 0722  INR 1.5*   Thyroid  Function Tests: No results for input(s): "TSH", "T4TOTAL", "FREET4", "T3FREE", "THYROIDAB" in the last 72 hours. Lipid Profile: No results for input(s): "CHOL", "HDL", "LDLCALC", "TRIG", "CHOLHDL", "LDLDIRECT" in the last 72 hours. Anemia Panel: No results for input(s): "VITAMINB12", "FOLATE", "FERRITIN", "TIBC", "IRON", "RETICCTPCT" in the last 72 hours. Urine analysis:    Component Value Date/Time   COLORURINE STRAW (A) 08/29/2023 1336   APPEARANCEUR HAZY (A) 08/29/2023 1336   LABSPEC 1.006 08/29/2023 1336   PHURINE 6.0 08/29/2023 1336   GLUCOSEU NEGATIVE 08/29/2023 1336   HGBUR SMALL (A) 08/29/2023 1336   BILIRUBINUR NEGATIVE 08/29/2023 1336   KETONESUR NEGATIVE 08/29/2023 1336   PROTEINUR NEGATIVE 08/29/2023 1336   UROBILINOGEN 1.0 04/15/2013 2256   NITRITE NEGATIVE 08/29/2023 1336   LEUKOCYTESUR MODERATE (A) 08/29/2023 1336   Sepsis Labs: Invalid input(s): "PROCALCITONIN", "LACTICIDVEN"   SIGNED:  Theadore Finger, MD  [facility] 10/20/2023, 2:04 PM

## 2023-10-20 NOTE — TOC Transition Note (Signed)
 Transition of Care [facility]) - Discharge Note   Patient Details  Name: Daniel Whitaker MRN: [id number] Date of Birth: 11-21-1945  Transition of Care [facility]) CM/SW Contact:  Tom-Johnson, Angelique Ken, RN Phone Number: 10/20/2023, 10:50 AM   Clinical Narrative:     Patient is scheduled for discharge today.  Readmission Risk Assessment done. Home health recommended, patient states he is active with outpatient PT and would like to continue. Outpatient order and referral on AVS. Hospital f/u and discharge instructions on AVS. Patient receives his O2 supplies from [facility].  Prescriptions sent to [facility] pharmacy and patient will receive meds prior discharge. Grand daughter, Laurina Popper to transport at discharge.  No further TOC needs noted.        Final next level of care: OP Rehab Barriers to Discharge: Barriers Resolved   Patient Goals and CMS Choice Patient states their goals for this hospitalization and ongoing recovery are:: To return home CMS Medicare.gov Compare Post Acute Care list provided to:: Patient Choice offered to / list presented to : Patient      Discharge Placement                Patient to be transferred to facility by: Grand daughter Name of family member notified: [name]    Discharge Plan and Services Additional resources added to the After Visit Summary for                  DME Arranged: N/A DME Agency: NA       HH Arranged: Refused HH (Active with outpatient PT) HH Agency: NA        Social Drivers of Health (SDOH) Interventions SDOH Screenings   Food Insecurity: No Food Insecurity (10/15/2023)  Housing: Low Risk  (10/15/2023)  Transportation Needs: No Transportation Needs (10/15/2023)  Utilities: Not At Risk (10/15/2023)  Recent Concern: Utilities - At Risk (07/23/2023)  Depression (PHQ2-9): Low Risk  (03/12/2022)  Social Connections: Moderately Integrated (10/15/2023)  Tobacco Use: Medium Risk (10/15/2023)     Readmission Risk  Interventions    10/20/2023   10:48 AM 08/17/2023    8:48 AM 05/07/2023   10:37 AM  Readmission Risk Prevention Plan  Transportation Screening Complete Complete Complete  PCP or Specialist Appt within 5-7 Days   Complete  Home Care Screening   Complete  Medication Review (RN CM)   Complete  Medication Review (RN Care Manager) Referral to Pharmacy Complete   PCP or Specialist appointment within 3-5 days of discharge Complete Complete   HRI or Home Care Consult Complete Complete   SW Recovery Care/Counseling Consult Complete Complete   Palliative Care Screening Not Applicable Not Applicable   Skilled Nursing Facility Not Applicable Not Applicable

## 2023-10-20 NOTE — Progress Notes (Signed)
 [facility] Liaison Note  10/20/2023  Daniel Whitaker August 03, 1945 [id number]  Location: RN Hospital Liaison screened the patient remotely at [facility].  Insurance: [employer]   Daniel Whitaker is a 78 y.o. adult who is a Primary Care Patient of Glena Landau, MD [facility]). The patient was screened for  readmission hospitalization with noted extreme risk score for unplanned readmission risk with 5 IP/1 ED in 6 months.  The patient was assessed for potential Care Management service needs for post hospital transition for care coordination. Review of patient's electronic medical record reveals patient was admitted with Respiratory Distress. Pt will discharged home with self care and recommended to continue his outpatient PT services. Pt has a [facility] provider that has  care management team for post hospital follow up. No VBCI services extended at this time.   VBCI Care Management/Population Health does not replace or interfere with any arrangements made by the Inpatient Transition of Care team.   For questions contact:   Lilla Reichert, RN, BSN Hospital Liaison [place]   [facility], Population Health Office Hours MTWF  8:00 am-6:00 pm Direct Dial: [phone] mobile [email or web address]

## 2023-10-22 ENCOUNTER — Telehealth: Payer: Self-pay

## 2023-10-22 NOTE — Telephone Encounter (Signed)
 Spoke with patient to check-in after recent hospitalization. Patient reports that he is doing well overall.  His appetite is doing well and he has no reports of cough or fever.  Patient notes that he is still on 4 L oxygen, which was his baseline prior to recent admission. Patient reports trying to wean himself off of the oxygen yesterday, but desat to 80's. Patient reports that he will continue wearing his oxygen and is working hard on his breathing exercises. Also, noted that he has been walking more to build up stamina.  Dr. Freddrick Jaffe made aware of patient's status following hospital admission and message relayed that patient can restart his Crestor .  Patient verbalized an understanding of the information and voiced appreciation for the call.  Patient knows to call the office if he has any questions or concerns.

## 2023-10-28 ENCOUNTER — Inpatient Hospital Stay: Admitting: Hematology and Oncology

## 2023-10-28 ENCOUNTER — Inpatient Hospital Stay

## 2023-10-28 ENCOUNTER — Encounter: Payer: Self-pay | Admitting: Hematology and Oncology

## 2023-10-28 VITALS — BP 122/52 | HR 68 | Resp 18 | Ht 70.0 in | Wt 198.4 lb

## 2023-10-28 VITALS — BP 107/52 | HR 72 | Resp 18

## 2023-10-28 DIAGNOSIS — R6521 Severe sepsis with septic shock: Secondary | ICD-10-CM | POA: Diagnosis not present

## 2023-10-28 DIAGNOSIS — Z1152 Encounter for screening for COVID-19: Secondary | ICD-10-CM | POA: Diagnosis not present

## 2023-10-28 DIAGNOSIS — R634 Abnormal weight loss: Secondary | ICD-10-CM | POA: Diagnosis not present

## 2023-10-28 DIAGNOSIS — I480 Paroxysmal atrial fibrillation: Secondary | ICD-10-CM

## 2023-10-28 DIAGNOSIS — Z7901 Long term (current) use of anticoagulants: Secondary | ICD-10-CM | POA: Diagnosis not present

## 2023-10-28 DIAGNOSIS — D638 Anemia in other chronic diseases classified elsewhere: Secondary | ICD-10-CM | POA: Diagnosis not present

## 2023-10-28 DIAGNOSIS — G893 Neoplasm related pain (acute) (chronic): Secondary | ICD-10-CM

## 2023-10-28 DIAGNOSIS — L89321 Pressure ulcer of left buttock, stage 1: Secondary | ICD-10-CM

## 2023-10-28 DIAGNOSIS — J80 Acute respiratory distress syndrome: Secondary | ICD-10-CM | POA: Diagnosis not present

## 2023-10-28 DIAGNOSIS — I2510 Atherosclerotic heart disease of native coronary artery without angina pectoris: Secondary | ICD-10-CM | POA: Diagnosis not present

## 2023-10-28 DIAGNOSIS — J9503 Malfunction of tracheostomy stoma: Secondary | ICD-10-CM | POA: Diagnosis not present

## 2023-10-28 DIAGNOSIS — E46 Unspecified protein-calorie malnutrition: Secondary | ICD-10-CM | POA: Diagnosis not present

## 2023-10-28 DIAGNOSIS — E871 Hypo-osmolality and hyponatremia: Secondary | ICD-10-CM | POA: Diagnosis not present

## 2023-10-28 DIAGNOSIS — E872 Acidosis, unspecified: Secondary | ICD-10-CM | POA: Diagnosis not present

## 2023-10-28 DIAGNOSIS — C9000 Multiple myeloma not having achieved remission: Secondary | ICD-10-CM | POA: Diagnosis not present

## 2023-10-28 DIAGNOSIS — A419 Sepsis, unspecified organism: Secondary | ICD-10-CM | POA: Diagnosis not present

## 2023-10-28 DIAGNOSIS — E875 Hyperkalemia: Secondary | ICD-10-CM | POA: Diagnosis not present

## 2023-10-28 DIAGNOSIS — N179 Acute kidney failure, unspecified: Secondary | ICD-10-CM | POA: Diagnosis not present

## 2023-10-28 DIAGNOSIS — Z515 Encounter for palliative care: Secondary | ICD-10-CM | POA: Diagnosis not present

## 2023-10-28 DIAGNOSIS — J9601 Acute respiratory failure with hypoxia: Secondary | ICD-10-CM | POA: Diagnosis not present

## 2023-10-28 DIAGNOSIS — Z66 Do not resuscitate: Secondary | ICD-10-CM | POA: Diagnosis not present

## 2023-10-28 DIAGNOSIS — D61818 Other pancytopenia: Secondary | ICD-10-CM | POA: Diagnosis not present

## 2023-10-28 DIAGNOSIS — G2581 Restless legs syndrome: Secondary | ICD-10-CM | POA: Diagnosis not present

## 2023-10-28 DIAGNOSIS — D849 Immunodeficiency, unspecified: Secondary | ICD-10-CM | POA: Diagnosis not present

## 2023-10-28 DIAGNOSIS — R0602 Shortness of breath: Secondary | ICD-10-CM | POA: Diagnosis not present

## 2023-10-28 DIAGNOSIS — C9001 Multiple myeloma in remission: Secondary | ICD-10-CM | POA: Diagnosis not present

## 2023-10-28 DIAGNOSIS — E781 Pure hyperglyceridemia: Secondary | ICD-10-CM | POA: Diagnosis not present

## 2023-10-28 LAB — CBC WITH DIFFERENTIAL (CANCER CENTER ONLY)
Abs Immature Granulocytes: 0.04 10*3/uL (ref 0.00–0.07)
Basophils Absolute: 0.1 10*3/uL (ref 0.0–0.1)
Basophils Relative: 1 %
Eosinophils Absolute: 0.2 10*3/uL (ref 0.0–0.5)
Eosinophils Relative: 2 %
HCT: 32.6 % — ABNORMAL LOW (ref 39.0–52.0)
Hemoglobin: 10.3 g/dL — ABNORMAL LOW (ref 13.0–17.0)
Immature Granulocytes: 0 %
Lymphocytes Relative: 14 %
Lymphs Abs: 1.3 10*3/uL (ref 0.7–4.0)
MCH: 27.8 pg (ref 26.0–34.0)
MCHC: 31.6 g/dL (ref 30.0–36.0)
MCV: 88.1 fL (ref 80.0–100.0)
Monocytes Absolute: 0.7 10*3/uL (ref 0.1–1.0)
Monocytes Relative: 8 %
Neutro Abs: 6.7 10*3/uL (ref 1.7–7.7)
Neutrophils Relative %: 75 %
Platelet Count: 247 10*3/uL (ref 150–400)
RBC: 3.7 MIL/uL — ABNORMAL LOW (ref 4.22–5.81)
RDW: 18.2 % — ABNORMAL HIGH (ref 11.5–15.5)
WBC Count: 9 10*3/uL (ref 4.0–10.5)
nRBC: 0 % (ref 0.0–0.2)

## 2023-10-28 LAB — CMP (CANCER CENTER ONLY)
ALT: 10 U/L (ref 0–44)
AST: 9 U/L — ABNORMAL LOW (ref 15–41)
Albumin: 3.6 g/dL (ref 3.5–5.0)
Alkaline Phosphatase: 140 U/L — ABNORMAL HIGH (ref 38–126)
Anion gap: 3 — ABNORMAL LOW (ref 5–15)
BUN: 23 mg/dL (ref 8–23)
CO2: 31 mmol/L (ref 22–32)
Calcium: 8.6 mg/dL — ABNORMAL LOW (ref 8.9–10.3)
Chloride: 107 mmol/L (ref 98–111)
Creatinine: 0.59 mg/dL — ABNORMAL LOW (ref 0.61–1.24)
GFR, Estimated: >60 mL/min (ref >60)
Glucose, Bld: 96 mg/dL (ref 70–99)
Potassium: 4 mmol/L (ref 3.5–5.1)
Sodium: 141 mmol/L (ref 135–145)
Total Bilirubin: 0.5 mg/dL (ref 0.0–1.2)
Total Protein: 5.5 g/dL — ABNORMAL LOW (ref 6.5–8.1)

## 2023-10-28 MED ORDER — CARVEDILOL 3.125 MG PO TABS: ORAL_TABLET | ORAL | Status: DC

## 2023-10-28 MED ORDER — SODIUM CHLORIDE 0.9% FLUSH
10.0000 mL | Freq: Once | INTRAVENOUS | Status: AC
  Administered 2023-10-28: 10 mL

## 2023-10-28 MED ORDER — DEXTROSE 5 % IV SOLN
36.0000 mg/m2 | Freq: Once | INTRAVENOUS | Status: AC
  Administered 2023-10-28: 80 mg via INTRAVENOUS
  Filled 2023-10-28: qty 30

## 2023-10-28 MED ORDER — HEPARIN SOD (PORK) LOCK FLUSH 100 UNIT/ML IV SOLN
500.0000 [IU] | Freq: Once | INTRAVENOUS | Status: AC | PRN
  Administered 2023-10-28: 500 [IU]

## 2023-10-28 MED ORDER — DEXAMETHASONE SODIUM PHOSPHATE 10 MG/ML IJ SOLN
10.0000 mg | Freq: Once | INTRAMUSCULAR | Status: AC
  Administered 2023-10-28: 10 mg via INTRAVENOUS
  Filled 2023-10-28: qty 1

## 2023-10-28 MED ORDER — SODIUM CHLORIDE 0.9 % IV SOLN: Freq: Once | INTRAVENOUS | Status: AC

## 2023-10-28 MED ORDER — SODIUM CHLORIDE 0.9% FLUSH
10.0000 mL | INTRAVENOUS | Status: DC | PRN
  Administered 2023-10-28: 10 mL

## 2023-10-28 MED ORDER — SODIUM CHLORIDE 0.9 % IV SOLN: INTRAVENOUS | Status: DC

## 2023-10-28 MED ORDER — SODIUM CHLORIDE 0.9 % IV SOLN
500.0000 mg | Freq: Once | INTRAVENOUS | Status: AC
  Administered 2023-10-28: 500 mg via INTRAVENOUS
  Filled 2023-10-28: qty 25

## 2023-10-28 NOTE — Assessment & Plan Note (Addendum)
 We discussed importance of frequent mobility and exercise as tolerated I encouraged the patient to resume physical therapy

## 2023-10-28 NOTE — Assessment & Plan Note (Addendum)
 He has no signs or symptoms of congestive heart failure or atrial fibrillation He will continue reduced Coreg  due to borderline low blood pressure He will discontinue furosemide 

## 2023-10-28 NOTE — Patient Instructions (Signed)
 [facility] - A DEPT OF [facility]   Discharge Instructions: Thank you for choosing [facility] to provide your oncology and hematology care.   If you have a lab appointment with the Cancer Center, please go directly to the Cancer Center and check in at the registration area.   Wear comfortable clothing and clothing appropriate for easy access to any Portacath or PICC line.   We strive to give you quality time with your provider. You may need to reschedule your appointment if you arrive late (15 or more minutes).  Arriving late affects you and other patients whose appointments are after yours.  Also, if you miss three or more appointments without notifying the office, you may be dismissed from the clinic at the provider's discretion.      For prescription refill requests, have your pharmacy contact our office and allow 72 hours for refills to be completed.    Today you received the following chemotherapy and/or immunotherapy agents: Carfilzomib  (Kyprolis ) and Cyclophosphamide  (Cytoxan )      To help prevent nausea and vomiting after your treatment, we encourage you to take your nausea medication as directed.  BELOW ARE SYMPTOMS THAT SHOULD BE REPORTED IMMEDIATELY: *FEVER GREATER THAN 100.4 F (38 C) OR HIGHER *CHILLS OR SWEATING *NAUSEA AND VOMITING THAT IS NOT CONTROLLED WITH YOUR NAUSEA MEDICATION *UNUSUAL SHORTNESS OF BREATH *UNUSUAL BRUISING OR BLEEDING *URINARY PROBLEMS (pain or burning when urinating, or frequent urination) *BOWEL PROBLEMS (unusual diarrhea, constipation, pain near the anus) TENDERNESS IN MOUTH AND THROAT WITH OR WITHOUT PRESENCE OF ULCERS (sore throat, sores in mouth, or a toothache) UNUSUAL RASH, SWELLING OR PAIN  UNUSUAL VAGINAL DISCHARGE OR ITCHING   Items with * indicate a potential emergency and should be followed up as soon as possible or go to the Emergency Department if any problems should occur.  Please show  the CHEMOTHERAPY ALERT CARD or IMMUNOTHERAPY ALERT CARD at check-in to the Emergency Department and triage nurse.  Should you have questions after your visit or need to cancel or reschedule your appointment, please contact [facility] - A DEPT OF [facility]  Dept: [phone]  and follow the prompts.  Office hours are 8:00 a.m. to 4:30 p.m. Monday - Friday. Please note that voicemails left after 4:00 p.m. may not be returned until the following business day.  We are closed weekends and major holidays. You have access to a nurse at all times for urgent questions. Please call the main number to the clinic Dept: [phone] and follow the prompts.   For any non-urgent questions, you may also contact your provider using MyChart. We now offer e-Visits for anyone 14 and older to request care online for non-urgent symptoms. For details visit [email or web address].   Also download the MyChart app! Go to the app store, search "MyChart", open the app, select [facility], and log in with your MyChart username and password.

## 2023-10-28 NOTE — Assessment & Plan Note (Addendum)
 This is likely anemia of chronic disease and from recent renal failure and chemotherapy. The patient denies recent history of bleeding such as epistaxis, hematuria or hematochezia. He is asymptomatic from the anemia. We will observe for now.

## 2023-10-28 NOTE — Assessment & Plan Note (Addendum)
 This is multifactorial His appetite is good We will complete prednisone  taper this week

## 2023-10-28 NOTE — Assessment & Plan Note (Addendum)
 He has IgA kappa multiple myeloma with multiple bone fractures at presentation in 2024.  Bone marrow biopsy showed 15% involvement, standard risk myeloma FISH and cytogenetics  He ha initially good partial response with combination chemotherapy with daratumumab , lenalidomide , bortezomib  and dexamethasone  but then started to have progression of disease Treatment was switched to combination treatment with carfilzomib , cyclophosphamide  and dexamethasone  Recent myeloma panel was shared with the patient which show positive response to therapy  His treatment course was complicated by pneumonia and respiratory failure requiring hospitalization Since discharge from the hospital, he is improving His using oxygen sporadically He does not need to take oxycodone  on a regular basis His appetite is good despite recent weight loss  We will proceed with chemotherapy today as scheduled He will continue vitamin D  supplement  He is on acyclovir  for antimicrobial prophylaxis He had received Zometa  on April 1, next dose will be due in July  Will continue treatment today as scheduled

## 2023-10-28 NOTE — Assessment & Plan Note (Addendum)
 He has good pain control He will continue oxycodone  taper

## 2023-10-28 NOTE — Progress Notes (Signed)
 [facility] OFFICE PROGRESS NOTE  Patient Care Team: Glena Landau, MD as PCP - General (Family Medicine) Eilleen Grates, MD as PCP - Cardiology (Cardiology)  Assessment & Plan Multiple myeloma not having achieved remission [facility]) He has IgA kappa multiple myeloma with multiple bone fractures at presentation in 2024.  Bone marrow biopsy showed 15% involvement, standard risk myeloma FISH and cytogenetics  He ha initially good partial response with combination chemotherapy with daratumumab , lenalidomide , bortezomib  and dexamethasone  but then started to have progression of disease Treatment was switched to combination treatment with carfilzomib , cyclophosphamide  and dexamethasone  Recent myeloma panel was shared with the patient which show positive response to therapy  His treatment course was complicated by pneumonia and respiratory failure requiring hospitalization Since discharge from the hospital, he is improving His using oxygen sporadically He does not need to take oxycodone  on a regular basis His appetite is good despite recent weight loss  We will proceed with chemotherapy today as scheduled He will continue vitamin D  supplement  He is on acyclovir  for antimicrobial prophylaxis He had received Zometa  on April 1, next dose will be due in July  Will continue treatment today as scheduled Paroxysmal atrial fibrillation (HCC) He has no signs or symptoms of congestive heart failure or atrial fibrillation He will continue reduced Coreg  due to borderline low blood pressure He will discontinue furosemide  Weight loss This is multifactorial His appetite is good We will complete prednisone  taper this week Cancer related pain He has good pain control He will continue oxycodone  taper Anemia of chronic disease This is likely anemia of chronic disease and from recent renal failure and chemotherapy. The patient denies recent history of bleeding such as epistaxis, hematuria  or hematochezia. He is asymptomatic from the anemia. We will observe for now.  Pressure injury of left buttock, stage 1 We discussed importance of frequent mobility and exercise as tolerated I encouraged the patient to resume physical therapy  No orders of the defined types were placed in this encounter.    [name], MD  INTERVAL HISTORY: he returns for treatment follow-up Complications related to previous cycle of chemotherapy included anemia,, weight loss,, cancer associated pain,, recent hospitalization/ ER visit,, and shortness of breath on exertion He has good pain control He barely uses oxycodone  yesterday Uses oxygen intermittently since discharge from the hospital No recent fever or chills He has lost some weight despite good appetite  PHYSICAL EXAMINATION: ECOG PERFORMANCE STATUS: 1 - Symptomatic but completely ambulatory  No results found for: "CAN125"    Latest Ref Rng & Units 10/28/2023    9:49 AM 10/20/2023    4:15 AM 10/19/2023    5:35 AM  CBC  WBC 4.0 - 10.5 K/uL 9.0   6.8   Hemoglobin 13.0 - 17.0 g/dL 16.1  8.5  8.6   Hematocrit 39.0 - 52.0 % 32.6  26.6  27.5   Platelets 150 - 400 K/uL 247   256       Chemistry      Component Value Date/Time   NA 141 10/28/2023 0949   NA 139 01/12/2017 1011   K 4.0 10/28/2023 0949   K 4.3 01/12/2017 1011   CL 107 10/28/2023 0949   CO2 31 10/28/2023 0949   CO2 23 01/12/2017 1011   BUN 23 10/28/2023 0949   BUN 21.8 01/12/2017 1011   CREATININE 0.59 (L) 10/28/2023 0949   CREATININE 0.9 01/12/2017 1011      Component Value Date/Time   CALCIUM  8.6 (L)  10/28/2023 0949   CALCIUM  9.0 01/12/2017 1011   ALKPHOS 140 (H) 10/28/2023 0949   ALKPHOS 81 01/12/2017 1011   AST 9 (L) 10/28/2023 0949   AST 20 01/12/2017 1011   ALT 10 10/28/2023 0949   ALT 18 01/12/2017 1011   BILITOT 0.5 10/28/2023 0949   BILITOT 0.58 01/12/2017 1011       Vitals:   10/28/23 1014  BP: (!) 122/52  Pulse: 68  Resp: 18  SpO2: 92%   Filed  Weights   10/28/23 1014  Weight: 198 lb 6.4 oz (90 kg)   Other relevant data reviewed during this visit included CBC and CMP

## 2023-10-29 ENCOUNTER — Telehealth: Payer: Self-pay

## 2023-10-29 ENCOUNTER — Encounter (HOSPITAL_BASED_OUTPATIENT_CLINIC_OR_DEPARTMENT_OTHER): Payer: Self-pay

## 2023-10-29 ENCOUNTER — Emergency Department (HOSPITAL_BASED_OUTPATIENT_CLINIC_OR_DEPARTMENT_OTHER)

## 2023-10-29 ENCOUNTER — Inpatient Hospital Stay (HOSPITAL_COMMUNITY)

## 2023-10-29 ENCOUNTER — Other Ambulatory Visit: Payer: Self-pay | Admitting: Hematology and Oncology

## 2023-10-29 ENCOUNTER — Other Ambulatory Visit: Payer: Self-pay

## 2023-10-29 ENCOUNTER — Inpatient Hospital Stay

## 2023-10-29 ENCOUNTER — Encounter: Payer: Self-pay | Admitting: Hematology and Oncology

## 2023-10-29 ENCOUNTER — Inpatient Hospital Stay (HOSPITAL_BASED_OUTPATIENT_CLINIC_OR_DEPARTMENT_OTHER)
Admission: EM | Admit: 2023-10-29 | Discharge: 2023-11-30 | DRG: 871 | Disposition: E | Attending: Pulmonary Disease | Admitting: Pulmonary Disease

## 2023-10-29 DIAGNOSIS — D849 Immunodeficiency, unspecified: Secondary | ICD-10-CM | POA: Diagnosis not present

## 2023-10-29 DIAGNOSIS — J9690 Respiratory failure, unspecified, unspecified whether with hypoxia or hypercapnia: Secondary | ICD-10-CM | POA: Diagnosis not present

## 2023-10-29 DIAGNOSIS — J80 Acute respiratory distress syndrome: Secondary | ICD-10-CM | POA: Diagnosis not present

## 2023-10-29 DIAGNOSIS — Z6832 Body mass index (BMI) 32.0-32.9, adult: Secondary | ICD-10-CM

## 2023-10-29 DIAGNOSIS — R6521 Severe sepsis with septic shock: Secondary | ICD-10-CM | POA: Diagnosis not present

## 2023-10-29 DIAGNOSIS — J9503 Malfunction of tracheostomy stoma: Secondary | ICD-10-CM | POA: Diagnosis not present

## 2023-10-29 DIAGNOSIS — N179 Acute kidney failure, unspecified: Secondary | ICD-10-CM | POA: Diagnosis not present

## 2023-10-29 DIAGNOSIS — Z79899 Other long term (current) drug therapy: Secondary | ICD-10-CM

## 2023-10-29 DIAGNOSIS — R0600 Dyspnea, unspecified: Principal | ICD-10-CM

## 2023-10-29 DIAGNOSIS — A419 Sepsis, unspecified organism: Secondary | ICD-10-CM | POA: Diagnosis not present

## 2023-10-29 DIAGNOSIS — Z808 Family history of malignant neoplasm of other organs or systems: Secondary | ICD-10-CM

## 2023-10-29 DIAGNOSIS — Z515 Encounter for palliative care: Secondary | ICD-10-CM | POA: Diagnosis not present

## 2023-10-29 DIAGNOSIS — Z8249 Family history of ischemic heart disease and other diseases of the circulatory system: Secondary | ICD-10-CM

## 2023-10-29 DIAGNOSIS — I480 Paroxysmal atrial fibrillation: Secondary | ICD-10-CM | POA: Diagnosis present

## 2023-10-29 DIAGNOSIS — Z66 Do not resuscitate: Secondary | ICD-10-CM | POA: Diagnosis not present

## 2023-10-29 DIAGNOSIS — E871 Hypo-osmolality and hyponatremia: Secondary | ICD-10-CM | POA: Diagnosis not present

## 2023-10-29 DIAGNOSIS — Z87891 Personal history of nicotine dependence: Secondary | ICD-10-CM

## 2023-10-29 DIAGNOSIS — Z4682 Encounter for fitting and adjustment of non-vascular catheter: Secondary | ICD-10-CM | POA: Diagnosis not present

## 2023-10-29 DIAGNOSIS — R7401 Elevation of levels of liver transaminase levels: Secondary | ICD-10-CM | POA: Diagnosis present

## 2023-10-29 DIAGNOSIS — R0602 Shortness of breath: Secondary | ICD-10-CM | POA: Diagnosis not present

## 2023-10-29 DIAGNOSIS — Z7901 Long term (current) use of anticoagulants: Secondary | ICD-10-CM

## 2023-10-29 DIAGNOSIS — E781 Pure hyperglyceridemia: Secondary | ICD-10-CM | POA: Diagnosis present

## 2023-10-29 DIAGNOSIS — G4733 Obstructive sleep apnea (adult) (pediatric): Secondary | ICD-10-CM | POA: Diagnosis present

## 2023-10-29 DIAGNOSIS — Z1152 Encounter for screening for COVID-19: Secondary | ICD-10-CM | POA: Diagnosis not present

## 2023-10-29 DIAGNOSIS — G2581 Restless legs syndrome: Secondary | ICD-10-CM | POA: Diagnosis present

## 2023-10-29 DIAGNOSIS — R918 Other nonspecific abnormal finding of lung field: Secondary | ICD-10-CM | POA: Diagnosis not present

## 2023-10-29 DIAGNOSIS — E875 Hyperkalemia: Secondary | ICD-10-CM | POA: Diagnosis not present

## 2023-10-29 DIAGNOSIS — Z888 Allergy status to other drugs, medicaments and biological substances status: Secondary | ICD-10-CM

## 2023-10-29 DIAGNOSIS — J189 Pneumonia, unspecified organism: Secondary | ICD-10-CM | POA: Diagnosis present

## 2023-10-29 DIAGNOSIS — E669 Obesity, unspecified: Secondary | ICD-10-CM | POA: Diagnosis present

## 2023-10-29 DIAGNOSIS — J984 Other disorders of lung: Secondary | ICD-10-CM | POA: Diagnosis not present

## 2023-10-29 DIAGNOSIS — C9001 Multiple myeloma in remission: Secondary | ICD-10-CM | POA: Diagnosis present

## 2023-10-29 DIAGNOSIS — C7951 Secondary malignant neoplasm of bone: Secondary | ICD-10-CM | POA: Diagnosis not present

## 2023-10-29 DIAGNOSIS — Z9221 Personal history of antineoplastic chemotherapy: Secondary | ICD-10-CM

## 2023-10-29 DIAGNOSIS — E872 Acidosis, unspecified: Secondary | ICD-10-CM | POA: Diagnosis present

## 2023-10-29 DIAGNOSIS — G629 Polyneuropathy, unspecified: Secondary | ICD-10-CM | POA: Diagnosis present

## 2023-10-29 DIAGNOSIS — J9691 Respiratory failure, unspecified with hypoxia: Secondary | ICD-10-CM | POA: Diagnosis not present

## 2023-10-29 DIAGNOSIS — R509 Fever, unspecified: Secondary | ICD-10-CM | POA: Diagnosis not present

## 2023-10-29 DIAGNOSIS — D61818 Other pancytopenia: Secondary | ICD-10-CM | POA: Diagnosis not present

## 2023-10-29 DIAGNOSIS — C9000 Multiple myeloma not having achieved remission: Secondary | ICD-10-CM | POA: Diagnosis not present

## 2023-10-29 DIAGNOSIS — I2510 Atherosclerotic heart disease of native coronary artery without angina pectoris: Secondary | ICD-10-CM | POA: Diagnosis present

## 2023-10-29 DIAGNOSIS — J9601 Acute respiratory failure with hypoxia: Secondary | ICD-10-CM | POA: Diagnosis present

## 2023-10-29 DIAGNOSIS — Z801 Family history of malignant neoplasm of trachea, bronchus and lung: Secondary | ICD-10-CM

## 2023-10-29 DIAGNOSIS — Y95 Nosocomial condition: Secondary | ICD-10-CM | POA: Diagnosis present

## 2023-10-29 DIAGNOSIS — E46 Unspecified protein-calorie malnutrition: Secondary | ICD-10-CM | POA: Diagnosis present

## 2023-10-29 DIAGNOSIS — J811 Chronic pulmonary edema: Secondary | ICD-10-CM | POA: Diagnosis not present

## 2023-10-29 DIAGNOSIS — Z880 Allergy status to penicillin: Secondary | ICD-10-CM

## 2023-10-29 DIAGNOSIS — R0902 Hypoxemia: Secondary | ICD-10-CM | POA: Diagnosis not present

## 2023-10-29 DIAGNOSIS — Z9981 Dependence on supplemental oxygen: Secondary | ICD-10-CM

## 2023-10-29 DIAGNOSIS — Z452 Encounter for adjustment and management of vascular access device: Secondary | ICD-10-CM | POA: Diagnosis not present

## 2023-10-29 DIAGNOSIS — Z955 Presence of coronary angioplasty implant and graft: Secondary | ICD-10-CM

## 2023-10-29 LAB — I-STAT ARTERIAL BLOOD GAS, ED
Acid-base deficit: 7 mmol/L — ABNORMAL HIGH (ref 0.0–2.0)
Acid-base deficit: 6 mmol/L — ABNORMAL HIGH (ref 0.0–2.0)
Bicarbonate: 25.6 mmol/L (ref 20.0–28.0)
Bicarbonate: 20.2 mmol/L (ref 20.0–28.0)
Calcium, Ion: 1.16 mmol/L (ref 1.15–1.40)
Calcium, Ion: 1.09 mmol/L — ABNORMAL LOW (ref 1.15–1.40)
HCT: 35 % — ABNORMAL LOW (ref 39.0–52.0)
HCT: 34 % — ABNORMAL LOW (ref 39.0–52.0)
Hemoglobin: 11.9 g/dL — ABNORMAL LOW (ref 13.0–17.0)
Hemoglobin: 11.6 g/dL — ABNORMAL LOW (ref 13.0–17.0)
O2 Saturation: 96 %
O2 Saturation: 87 %
Patient temperature: 100
Patient temperature: 100
Potassium: 5 mmol/L (ref 3.5–5.1)
Potassium: 4.6 mmol/L (ref 3.5–5.1)
Sodium: 137 mmol/L (ref 135–145)
Sodium: 138 mmol/L (ref 135–145)
TCO2: 29 mmol/L (ref 22–32)
TCO2: 21 mmol/L — ABNORMAL LOW (ref 22–32)
pCO2 arterial: 103.5 mmHg (ref 32–48)
pCO2 arterial: 43.4 mmHg (ref 32–48)
pH, Arterial: 7.007 — CL (ref 7.35–7.45)
pH, Arterial: 7.279 — ABNORMAL LOW (ref 7.35–7.45)
pO2, Arterial: 127 mmHg — ABNORMAL HIGH (ref 83–108)
pO2, Arterial: 63 mmHg — ABNORMAL LOW (ref 83–108)

## 2023-10-29 LAB — URINALYSIS, W/ REFLEX TO CULTURE (INFECTION SUSPECTED)
Bacteria, UA: NONE SEEN
Bilirubin Urine: NEGATIVE
Glucose, UA: NEGATIVE mg/dL
Ketones, ur: NEGATIVE mg/dL
Leukocytes,Ua: NEGATIVE
Nitrite: NEGATIVE
Protein, ur: NEGATIVE mg/dL
Specific Gravity, Urine: 1.007 (ref 1.005–1.030)
pH: 5 (ref 5.0–8.0)

## 2023-10-29 LAB — CBC WITH DIFFERENTIAL/PLATELET
Abs Immature Granulocytes: 0.05 10*3/uL (ref 0.00–0.07)
Basophils Absolute: 0 10*3/uL (ref 0.0–0.1)
Basophils Relative: 0 %
Eosinophils Absolute: 0 10*3/uL (ref 0.0–0.5)
Eosinophils Relative: 0 %
HCT: 35.8 % — ABNORMAL LOW (ref 39.0–52.0)
Hemoglobin: 11 g/dL — ABNORMAL LOW (ref 13.0–17.0)
Immature Granulocytes: 1 %
Lymphocytes Relative: 4 %
Lymphs Abs: 0.4 10*3/uL — ABNORMAL LOW (ref 0.7–4.0)
MCH: 27.9 pg (ref 26.0–34.0)
MCHC: 30.7 g/dL (ref 30.0–36.0)
MCV: 90.9 fL (ref 80.0–100.0)
Monocytes Absolute: 0.5 10*3/uL (ref 0.1–1.0)
Monocytes Relative: 5 %
Neutro Abs: 8.6 10*3/uL — ABNORMAL HIGH (ref 1.7–7.7)
Neutrophils Relative %: 90 %
Platelets: 177 10*3/uL (ref 150–400)
RBC: 3.94 MIL/uL — ABNORMAL LOW (ref 4.22–5.81)
RDW: 18.5 % — ABNORMAL HIGH (ref 11.5–15.5)
WBC: 9.6 10*3/uL (ref 4.0–10.5)
nRBC: 0 % (ref 0.0–0.2)

## 2023-10-29 LAB — BLOOD GAS, ARTERIAL
Acid-base deficit: 3.7 mmol/L — ABNORMAL HIGH (ref 0.0–2.0)
Acid-base deficit: 5.9 mmol/L — ABNORMAL HIGH (ref 0.0–2.0)
Bicarbonate: 24.4 mmol/L (ref 20.0–28.0)
Bicarbonate: 22.4 mmol/L (ref 20.0–28.0)
Drawn by: 20012
FIO2: 100 %
FIO2: 100 %
MECHVT: 550 mL
O2 Content: 100 L/min
O2 Content: 15 L/min
O2 Saturation: 94.3 %
O2 Saturation: 73.1 %
PEEP: 14 cmH2O
Patient temperature: 39.7
Patient temperature: 37.1
RATE: 28 {breaths}/min
pCO2 arterial: 64 mmHg — ABNORMAL HIGH (ref 32–48)
pCO2 arterial: 56 mmHg — ABNORMAL HIGH (ref 32–48)
pH, Arterial: 7.2 — ABNORMAL LOW (ref 7.35–7.45)
pH, Arterial: 7.21 — ABNORMAL LOW (ref 7.35–7.45)
pO2, Arterial: 79 mmHg — ABNORMAL LOW (ref 83–108)
pO2, Arterial: 37 mmHg — CL (ref 83–108)

## 2023-10-29 LAB — PROTIME-INR
INR: 1.5 — ABNORMAL HIGH (ref 0.8–1.2)
Prothrombin Time: 17.9 s — ABNORMAL HIGH (ref 11.4–15.2)

## 2023-10-29 LAB — LACTIC ACID, PLASMA
Lactic Acid, Venous: 1.3 mmol/L (ref 0.5–1.9)
Lactic Acid, Venous: 2.4 mmol/L (ref 0.5–1.9)

## 2023-10-29 LAB — RESP PANEL BY RT-PCR (RSV, FLU A&B, COVID)  RVPGX2
Influenza A by PCR: NEGATIVE
Influenza B by PCR: NEGATIVE
Resp Syncytial Virus by PCR: NEGATIVE
SARS Coronavirus 2 by RT PCR: NEGATIVE

## 2023-10-29 LAB — GLUCOSE, CAPILLARY
Glucose-Capillary: 119 mg/dL — ABNORMAL HIGH (ref 70–99)
Glucose-Capillary: 96 mg/dL (ref 70–99)

## 2023-10-29 LAB — COMPREHENSIVE METABOLIC PANEL WITH GFR
ALT: 11 U/L (ref 0–44)
AST: 14 U/L — ABNORMAL LOW (ref 15–41)
Albumin: 3.6 g/dL (ref 3.5–5.0)
Alkaline Phosphatase: 144 U/L — ABNORMAL HIGH (ref 38–126)
Anion gap: 13 (ref 5–15)
BUN: 32 mg/dL — ABNORMAL HIGH (ref 8–23)
CO2: 22 mmol/L (ref 22–32)
Calcium: 8.5 mg/dL — ABNORMAL LOW (ref 8.9–10.3)
Chloride: 104 mmol/L (ref 98–111)
Creatinine, Ser: 1.25 mg/dL — ABNORMAL HIGH (ref 0.61–1.24)
GFR, Estimated: 59 mL/min — ABNORMAL LOW (ref >60)
Glucose, Bld: 135 mg/dL — ABNORMAL HIGH (ref 70–99)
Potassium: 4.3 mmol/L (ref 3.5–5.1)
Sodium: 139 mmol/L (ref 135–145)
Total Bilirubin: 0.4 mg/dL (ref 0.0–1.2)
Total Protein: 5.4 g/dL — ABNORMAL LOW (ref 6.5–8.1)

## 2023-10-29 LAB — MAGNESIUM: Magnesium: 1.8 mg/dL (ref 1.7–2.4)

## 2023-10-29 LAB — MRSA NEXT GEN BY PCR, NASAL: MRSA by PCR Next Gen: NOT DETECTED

## 2023-10-29 LAB — PHOSPHORUS: Phosphorus: 3.9 mg/dL (ref 2.5–4.6)

## 2023-10-29 LAB — PROCALCITONIN: Procalcitonin: 15.1 ng/mL

## 2023-10-29 MED ORDER — PHENYLEPHRINE 80 MCG/ML (10ML) SYRINGE FOR IV PUSH (FOR BLOOD PRESSURE SUPPORT)
80.0000 ug | PREFILLED_SYRINGE | Freq: Once | INTRAVENOUS | Status: AC | PRN
  Administered 2023-10-29: 200 ug via INTRAVENOUS

## 2023-10-29 MED ORDER — SUCCINYLCHOLINE CHLORIDE 200 MG/10ML IV SOSY
PREFILLED_SYRINGE | INTRAVENOUS | Status: AC
  Administered 2023-10-29: 100 mg
  Filled 2023-10-29: qty 10

## 2023-10-29 MED ORDER — FENTANYL 2500MCG IN NS 250ML (10MCG/ML) PREMIX INFUSION
0.0000 ug/h | INTRAVENOUS | Status: DC
  Administered 2023-10-29: 75 ug/h via INTRAVENOUS
  Filled 2023-10-29: qty 250

## 2023-10-29 MED ORDER — DOCUSATE SODIUM 100 MG PO CAPS: 100.0000 mg | ORAL_CAPSULE | Freq: Two times a day (BID) | ORAL | Status: DC | PRN

## 2023-10-29 MED ORDER — VASOPRESSIN 20 UNITS/100 ML INFUSION FOR SHOCK
0.0000 [IU]/min | INTRAVENOUS | Status: DC
  Administered 2023-10-29: 0.03 [IU]/min via INTRAVENOUS
  Administered 2023-10-30 – 2023-10-31 (×5): 0.04 [IU]/min via INTRAVENOUS
  Administered 2023-11-01: 0.03 [IU]/min via INTRAVENOUS
  Filled 2023-10-29 (×6): qty 100

## 2023-10-29 MED ORDER — FENTANYL BOLUS VIA INFUSION
25.0000 ug | INTRAVENOUS | Status: DC | PRN
  Administered 2023-10-29: 50 ug via INTRAVENOUS
  Administered 2023-10-29: 25 ug via INTRAVENOUS
  Administered 2023-10-30 (×3): 100 ug via INTRAVENOUS
  Administered 2023-10-31 (×2): 50 ug via INTRAVENOUS
  Administered 2023-10-31: 100 ug via INTRAVENOUS
  Filled 2023-10-29: qty 100

## 2023-10-29 MED ORDER — SODIUM BICARBONATE 8.4 % IV SOLN: 100.0000 meq | Freq: Once | INTRAVENOUS | Status: AC

## 2023-10-29 MED ORDER — VANCOMYCIN HCL 1500 MG/300ML IV SOLN
1500.0000 mg | INTRAVENOUS | Status: DC
  Filled 2023-10-29: qty 300

## 2023-10-29 MED ORDER — SODIUM CHLORIDE 0.9 % IV SOLN
2.0000 g | Freq: Two times a day (BID) | INTRAVENOUS | Status: DC
  Administered 2023-10-29 – 2023-10-31 (×5): 2 g via INTRAVENOUS
  Filled 2023-10-29 (×5): qty 12.5

## 2023-10-29 MED ORDER — POLYETHYLENE GLYCOL 3350 17 G PO PACK: 17.0000 g | PACK | Freq: Every day | ORAL | Status: DC | PRN

## 2023-10-29 MED ORDER — MIDAZOLAM HCL 2 MG/2ML IJ SOLN: 4.0000 mg | Freq: Once | INTRAMUSCULAR | Status: AC

## 2023-10-29 MED ORDER — PANTOPRAZOLE SODIUM 40 MG IV SOLR
40.0000 mg | Freq: Every day | INTRAVENOUS | Status: DC
  Administered 2023-10-29 – 2023-10-31 (×3): 40 mg via INTRAVENOUS
  Filled 2023-10-29 (×3): qty 10

## 2023-10-29 MED ORDER — SODIUM CHLORIDE 0.9 % IV BOLUS
2000.0000 mL | Freq: Once | INTRAVENOUS | Status: AC
  Administered 2023-10-29: 2000 mL via INTRAVENOUS

## 2023-10-29 MED ORDER — CHLORHEXIDINE GLUCONATE CLOTH 2 % EX PADS
6.0000 | MEDICATED_PAD | Freq: Every day | CUTANEOUS | Status: DC
  Administered 2023-10-29 – 2023-10-31 (×3): 6 via TOPICAL

## 2023-10-29 MED ORDER — SODIUM BICARBONATE 8.4 % IV SOLN: 50.0000 meq | Freq: Once | INTRAVENOUS | Status: DC

## 2023-10-29 MED ORDER — SODIUM CHLORIDE 0.9 % IV BOLUS
1000.0000 mL | Freq: Once | INTRAVENOUS | Status: AC
  Administered 2023-10-29: 1000 mL via INTRAVENOUS

## 2023-10-29 MED ORDER — LACTATED RINGERS IV SOLN: INTRAVENOUS | Status: DC

## 2023-10-29 MED ORDER — PROPOFOL 1000 MG/100ML IV EMUL
0.0000 ug/kg/min | INTRAVENOUS | Status: DC
  Administered 2023-10-29: 25 ug/kg/min via INTRAVENOUS
  Administered 2023-10-29: 10 ug/kg/min via INTRAVENOUS
  Administered 2023-10-30: 20 ug/kg/min via INTRAVENOUS
  Administered 2023-10-31: 25 ug/kg/min via INTRAVENOUS
  Administered 2023-10-31: 15 ug/kg/min via INTRAVENOUS
  Administered 2023-10-31: 45 ug/kg/min via INTRAVENOUS
  Administered 2023-10-31: 20 ug/kg/min via INTRAVENOUS
  Administered 2023-11-01 (×2): 45 ug/kg/min via INTRAVENOUS
  Filled 2023-10-29 (×10): qty 100

## 2023-10-29 MED ORDER — VITAL HIGH PROTEIN PO LIQD
1000.0000 mL | ORAL | Status: AC
  Administered 2023-10-29: 1000 mL

## 2023-10-29 MED ORDER — ACETAMINOPHEN 10 MG/ML IV SOLN
1000.0000 mg | Freq: Four times a day (QID) | INTRAVENOUS | Status: AC
  Administered 2023-10-29 – 2023-10-30 (×2): 1000 mg via INTRAVENOUS
  Filled 2023-10-29 (×2): qty 100

## 2023-10-29 MED ORDER — FUROSEMIDE 10 MG/ML IJ SOLN
INTRAMUSCULAR | Status: AC
  Administered 2023-10-29: 40 mg
  Filled 2023-10-29: qty 4

## 2023-10-29 MED ORDER — VANCOMYCIN HCL IN DEXTROSE 1-5 GM/200ML-% IV SOLN
1000.0000 mg | Freq: Once | INTRAVENOUS | Status: AC
  Administered 2023-10-29: 1000 mg via INTRAVENOUS
  Filled 2023-10-29: qty 200

## 2023-10-29 MED ORDER — PROSOURCE TF20 ENFIT COMPATIBL EN LIQD
60.0000 mL | Freq: Every day | ENTERAL | Status: AC
  Administered 2023-10-29: 60 mL
  Filled 2023-10-29: qty 60

## 2023-10-29 MED ORDER — PHENYLEPHRINE HCL-NACL 20-0.9 MG/250ML-% IV SOLN
INTRAVENOUS | Status: DC
Start: 2023-10-29 — End: 2023-10-29
  Filled 2023-10-29: qty 250

## 2023-10-29 MED ORDER — PHENYLEPHRINE 80 MCG/ML (10ML) SYRINGE FOR IV PUSH (FOR BLOOD PRESSURE SUPPORT)
PREFILLED_SYRINGE | INTRAVENOUS | Status: AC
  Administered 2023-10-29: 200 ug
  Administered 2023-10-29: 600 ug
  Filled 2023-10-29: qty 10

## 2023-10-29 MED ORDER — MIDAZOLAM HCL 2 MG/2ML IJ SOLN
INTRAMUSCULAR | Status: AC
  Administered 2023-10-29: 4 mg via INTRAVENOUS
  Filled 2023-10-29: qty 4

## 2023-10-29 MED ORDER — ORAL CARE MOUTH RINSE: 15.0000 mL | OROMUCOSAL | Status: DC | PRN

## 2023-10-29 MED ORDER — OXYCODONE HCL 5 MG PO TABS
15.0000 mg | ORAL_TABLET | ORAL | Status: AC
  Administered 2023-10-29: 15 mg via ORAL
  Filled 2023-10-29: qty 3

## 2023-10-29 MED ORDER — SODIUM CHLORIDE 0.9 % IV SOLN
2.0000 g | Freq: Once | INTRAVENOUS | Status: AC
  Administered 2023-10-29: 2 g via INTRAVENOUS
  Filled 2023-10-29: qty 12.5

## 2023-10-29 MED ORDER — SODIUM CHLORIDE 0.9 % IV SOLN: INTRAVENOUS | Status: DC

## 2023-10-29 MED ORDER — NOREPINEPHRINE 4 MG/250ML-% IV SOLN
0.0000 ug/min | INTRAVENOUS | Status: DC
  Administered 2023-10-29: 8 ug/min via INTRAVENOUS
  Filled 2023-10-29: qty 250

## 2023-10-29 MED ORDER — SODIUM BICARBONATE 8.4 % IV SOLN
INTRAVENOUS | Status: AC
  Administered 2023-10-29: 100 meq via INTRAVENOUS
  Filled 2023-10-29: qty 100

## 2023-10-29 MED ORDER — ROCURONIUM BROMIDE 10 MG/ML (PF) SYRINGE
PREFILLED_SYRINGE | INTRAVENOUS | Status: AC
  Filled 2023-10-29: qty 10

## 2023-10-29 MED ORDER — ACETAMINOPHEN 650 MG RE SUPP
650.0000 mg | Freq: Once | RECTAL | Status: AC
  Administered 2023-10-29: 650 mg via RECTAL
  Filled 2023-10-29: qty 1

## 2023-10-29 MED ORDER — VASOPRESSIN 20 UNITS/100 ML INFUSION FOR SHOCK
INTRAVENOUS | Status: AC
  Filled 2023-10-29: qty 100

## 2023-10-29 MED ORDER — STERILE WATER FOR INJECTION IV SOLN
INTRAVENOUS | Status: DC
  Filled 2023-10-29 (×2): qty 150
  Filled 2023-10-29 (×3): qty 1000
  Filled 2023-10-29: qty 150

## 2023-10-29 MED ORDER — NALOXONE HCL 2 MG/2ML IJ SOSY
PREFILLED_SYRINGE | INTRAMUSCULAR | Status: AC
  Administered 2023-10-29: 2 mg
  Filled 2023-10-29: qty 2

## 2023-10-29 MED ORDER — ETOMIDATE 2 MG/ML IV SOLN
INTRAVENOUS | Status: AC
  Administered 2023-10-29: 20 mg
  Filled 2023-10-29: qty 20

## 2023-10-29 MED ORDER — ACETAMINOPHEN 325 MG PO TABS: 650.0000 mg | ORAL_TABLET | Freq: Once | ORAL | Status: DC

## 2023-10-29 MED ORDER — EPINEPHRINE HCL 5 MG/250ML IV SOLN IN NS
0.5000 ug/min | INTRAVENOUS | Status: DC
  Administered 2023-10-29: 0.5 ug/min via INTRAVENOUS
  Administered 2023-10-30: 2 ug/min via INTRAVENOUS
  Filled 2023-10-29: qty 250

## 2023-10-29 MED ORDER — EPINEPHRINE HCL 5 MG/250ML IV SOLN IN NS
INTRAVENOUS | Status: AC
  Administered 2023-10-29: 0.5 ug/min via INTRAVENOUS
  Filled 2023-10-29: qty 250

## 2023-10-29 MED ORDER — ORAL CARE MOUTH RINSE
15.0000 mL | OROMUCOSAL | Status: DC
  Administered 2023-10-29 – 2023-11-01 (×28): 15 mL via OROMUCOSAL

## 2023-10-29 MED ORDER — NOREPINEPHRINE 16 MG/250ML-% IV SOLN
0.0000 ug/min | INTRAVENOUS | Status: DC
  Administered 2023-10-29: 30 ug/min via INTRAVENOUS
  Administered 2023-10-30 (×2): 40 ug/min via INTRAVENOUS
  Administered 2023-10-30: 32 ug/min via INTRAVENOUS
  Administered 2023-10-31: 18 ug/min via INTRAVENOUS
  Administered 2023-10-31: 32 ug/min via INTRAVENOUS
  Filled 2023-10-29 (×6): qty 250

## 2023-10-29 MED ORDER — STERILE WATER FOR INJECTION IJ SOLN
50.0000 ng/kg/min | INTRAVENOUS | Status: DC
  Filled 2023-10-29 (×2): qty 5

## 2023-10-29 MED ORDER — LACTATED RINGERS IV BOLUS
500.0000 mL | Freq: Once | INTRAVENOUS | Status: AC
  Administered 2023-10-29: 500 mL via INTRAVENOUS

## 2023-10-29 NOTE — ED Notes (Signed)
 Karen at [facility] called to spk w/Sharon. She will CB in due to Respiratory and nurse working with patient. Dr Leighton Punches is in now.ABB(NS)

## 2023-10-29 NOTE — ED Provider Notes (Addendum)
 [place] [facility] AT [facility] Provider Note   CSN: [id number] Arrival date & time: 10/29/23  4098     History  Chief Complaint  Patient presents with   Fever   Altered Mental Status    Daniel Whitaker is a 78 y.o. adult.  78 year old male with history with myeloma presents with fever and worsening shortness of breath.  Has noted increased cough.  Patient received chemotherapy yesterday.  Recently been treated for pneumonia and just finished his antibiotics.  Denies any vomiting or diarrhea.  Endorses weakness.  Patient does use home oxygen as needed.  Has had to use it more often recently.  States he feels like he does not have any energy.       Home Medications Prior to Admission medications   Medication Sig Start Date End Date Taking? Authorizing Provider  acyclovir  (ZOVIRAX ) 400 MG tablet Take 1 tablet (400 mg total) by mouth daily. 06/09/23   [name], MD  albuterol  (VENTOLIN  HFA) 108 (90 Base) MCG/ACT inhaler Inhale 1-2 puffs into the lungs every 6 (six) hours as needed for wheezing or shortness of breath.    [provider]  apixaban  (ELIQUIS ) 5 MG TABS tablet Take 1 tablet (5 mg total) by mouth 2 (two) times daily. 08/20/23   Barbee Lew, MD  brimonidine  (ALPHAGAN ) 0.2 % ophthalmic solution Place 1 drop into the left eye in the morning and at bedtime. 01/08/20   [provider]  calcium  carbonate (TUMS - DOSED IN MG ELEMENTAL CALCIUM ) 500 MG chewable tablet Chew 1 tablet by mouth daily.    [provider]  carvedilol  (COREG ) 3.125 MG tablet Tale 1/2 tab twice daily 10/28/23   [name], MD  cetirizine (ZYRTEC) 10 MG tablet Take 10 mg by mouth every morning.    [provider]  DENTA 5000 PLUS 1.1 % CREA dental cream Place 1 Application onto teeth at bedtime. 06/08/23   [provider]  docusate sodium  (COLACE) 100 MG capsule Take 300 mg by mouth daily.    [provider]   dorzolamide -timolol  (COSOPT ) 22.3-6.8 MG/ML ophthalmic solution Place 1 drop into both eyes 2 (two) times daily. 03/09/18   [provider]  ketoconazole (NIZORAL) 2 % cream Apply 1 application  topically daily as needed for irritation. 11/28/14   [provider]  latanoprost  (XALATAN ) 0.005 % ophthalmic solution Place 1 drop into both eyes at bedtime. 03/01/13   [provider]  lidocaine -prilocaine  (EMLA ) cream Apply to affected area once Patient taking differently: Apply 1 Application topically 2 (two) times daily as needed (Pain). Port for treatment 09/01/23   [name], MD  morphine  (MS CONTIN ) 15 MG 12 hr tablet Take 1 tablet (15 mg total) by mouth at bedtime. Patient taking differently: Take 15 mg by mouth at bedtime as needed for pain. 08/25/23   [name], MD  Multiple Vitamins-Minerals (CENTRUM SILVER 50+MEN) TABS Take 1 tablet by mouth daily with breakfast. Patient not taking: Reported on 10/15/2023    [provider]  nitroGLYCERIN  (NITROSTAT ) 0.4 MG SL tablet Place 1 tablet (0.4 mg total) under the tongue every 5 (five) minutes as needed. Patient taking differently: Place 0.4 mg under the tongue every 5 (five) minutes as needed for chest pain. 10/22/21   Sanjuanita Cruz, NP  ondansetron  (ZOFRAN ) 8 MG tablet Take 1 tablet (8 mg total) by mouth every 8 (eight) hours as needed for nausea or vomiting. 05/11/23   [name], MD  oxyCODONE  (  ROXICODONE ) 15 MG immediate release tablet Take 1 tablet (15 mg total) by mouth every 4 (four) hours as needed for severe pain (pain score 7-10). 09/07/23   [name], MD  pantoprazole  (PROTONIX ) 40 MG tablet Take 40 mg by mouth daily before breakfast. 03/14/13   [provider]  polyethylene glycol (MIRALAX  / GLYCOLAX ) 17 g packet Take 17 g by mouth 2 (two) times daily. 07/30/23   [name], MD  pregabalin  (LYRICA ) 100 MG capsule TAKE 1 CAPSULE BY MOUTH IN THE MORNING, AND AT NOON, AND 2 CAPSULES AT  NIGHT Patient taking differently: Take 100-200 mg by mouth 2 (two) times daily. TAKE 1 CAPSULE BY MOUTH IN THE MORNING AND 2 CAPSULES AT NIGHT 10/05/23   Millikan, Megan, NP  prochlorperazine  (COMPAZINE ) 10 MG tablet Take 1 tablet (10 mg total) by mouth every 6 (six) hours as needed for nausea or vomiting. 05/11/23   [name], MD  rosuvastatin  (CRESTOR ) 40 MG tablet Take 40 mg by mouth in the morning and at bedtime. Patient not taking: Reported on 10/15/2023    [provider]  senna-docusate (SENOKOT-S) 8.6-50 MG tablet Take 2 tablets by mouth 2 (two) times daily. Patient taking differently: Take 2 tablets by mouth 2 (two) times daily as needed for moderate constipation. 07/30/23   [name], MD  triamcinolone  (NASACORT ) 55 MCG/ACT AERO nasal inhaler Place 1 spray into the nose daily as needed. 09/03/23   Audria Leather, MD  TYLENOL  500 MG tablet Take 1,000 mg by mouth every 6 (six) hours as needed for mild pain (pain score 1-3) or headache.    [provider]      Allergies    Penicillins, Duloxetine  hcl, and Atorvastatin    Review of Systems   Review of Systems  All other systems reviewed and are negative.   Physical Exam Updated Vital Signs BP (!) 111/55 (BP Location: Right Arm)   Pulse (!) 105   Temp 98.6 F (37 C) (Oral)   Resp (!) 25   SpO2 (!) 85%  Physical Exam Vitals and nursing note reviewed.  Constitutional:      General: He is not in acute distress.    Appearance: Normal appearance. He is well-developed. He is not toxic-appearing.  HENT:     Head: Normocephalic and atraumatic.  Eyes:     General: Lids are normal.     Conjunctiva/sclera: Conjunctivae normal.     Pupils: Pupils are equal, round, and reactive to light.  Neck:     Thyroid : No thyroid  mass.     Trachea: No tracheal deviation.  Cardiovascular:     Rate and Rhythm: Regular rhythm. Tachycardia present.     Heart sounds: Normal heart sounds. No murmur heard.    No gallop.  Pulmonary:      Effort: Tachypnea present. No respiratory distress.     Breath sounds: No stridor. Decreased breath sounds present. No wheezing, rhonchi or rales.  Abdominal:     General: There is no distension.     Palpations: Abdomen is soft.     Tenderness: There is no abdominal tenderness. There is no rebound.  Musculoskeletal:        General: No tenderness. Normal range of motion.     Cervical back: Normal range of motion and neck supple.  Skin:    General: Skin is warm and dry.     Findings: No abrasion or rash.  Neurological:     Mental Status: He is alert and oriented to person,  place, and time. Mental status is at baseline.     GCS: GCS eye subscore is 4. GCS verbal subscore is 5. GCS motor subscore is 6.     Cranial Nerves: No cranial nerve deficit.     Sensory: No sensory deficit.     Motor: Motor function is intact.  Psychiatric:        Attention and Perception: Attention normal.        Speech: Speech normal.        Behavior: Behavior normal.     ED Results / Procedures / Treatments   Labs (all labs ordered are listed, but only abnormal results are displayed) Labs Reviewed  CULTURE, BLOOD (ROUTINE X 2)  CULTURE, BLOOD (ROUTINE X 2)  RESP PANEL BY RT-PCR (RSV, FLU A&B, COVID)  [id number]  LACTIC ACID, PLASMA  LACTIC ACID, PLASMA  COMPREHENSIVE METABOLIC PANEL WITH GFR  CBC WITH DIFFERENTIAL/PLATELET  PROTIME-INR  URINALYSIS, W/ REFLEX TO CULTURE (INFECTION SUSPECTED)    EKG None  Radiology No results found.  Procedures Procedure Name: Intubation Date/Time: 10/29/2023 2:21 PM  Performed by: Lind Repine, MDPre-anesthesia Checklist: Patient identified, Patient being monitored, Emergency Drugs available, Timeout performed and Suction available Oxygen Delivery Method: Non-rebreather mask Preoxygenation: Pre-oxygenation with 100% oxygen Induction Type: Rapid sequence Ventilation: Mask ventilation without difficulty Laryngoscope Size: Miller and 3 Tube size: 8.0  mm Number of attempts: 1 Airway Equipment and Method: Video-laryngoscopy Placement Confirmation: ETT inserted through vocal cords under direct vision, CO2 detector and Breath sounds checked- equal and bilateral Secured at: 27 cm Tube secured with: ETT holder Dental Injury: Teeth and Oropharynx as per pre-operative assessment         Medications Ordered in ED Medications  lactated ringers  infusion (has no administration in time range)    ED Course/ Medical Decision Making/ A&P                                 Medical Decision Making Amount and/or Complexity of Data Reviewed Labs: ordered. Radiology: ordered.  Risk OTC drugs. Prescription drug management. Decision regarding hospitalization.   Patient placed on high flow nasal oxygen due to decreased O2 saturations.  Chest x-ray shows signs of bilateral pneumonia.  Patient oxygenation has improved.  Patient has no evidence of neutropenia.  Lactate is normal.  However, due to his underlying compromised immune state, patient started on broad-spectrum antibiotics.  I spoke with his oncologist Dr. Marton Sleeper, who agrees that patient needs admission at this time.  She will see him in consultation.  Will consult hospitalist team.  Family at bedside and informed of plan  1:15 PM Called to bedside due to worsening respiratory status.  Patient just finished his sepsis fluid bolus.  Suspect he is fluid overloaded.  Will place on BiPAP and will give 40 mg of Lasix .  Discussed CODE STATUS with family at bedside and they state he is a full code  2:18 PM Patient takes chronic pain medication oxycodone  15mg  every 4 hours and was informed nursing that patient was requesting pain medication.  Gave patient his usual dose.  Patient was monitored and during this time he did receive full sepsis protocol as mentioned above.  Patients respiratory status status became worse however.  Likely from IV fluids he received.  Patient was placed on BiPAP to support  his respirations.  Narcan  was also given 2 mg.  Slight response to this but patient did not return to baseline.  Long discussion with family at bedside who initially wanted to hold off on mechanical ventilation but due to his worsening respiratory status subsequently agreed to mechanical ventilation.  Patient's blood gas shows severe hypercapnia.  His pH was 7.  Patient intubated for airway protection.  Postintubation sedation ordered.  As well as respiratory failure.  Will consult ICU team for admission  3:15 PM Discussed with critical care physician.  Vent settings adjusted.  Patient became hypotensive and he required adjustment of his sedating medications of propofol  and fentanyl .  Spoke with family again about patient's current condition.  Patient will go to the ICU at [facility]  CRITICAL CARE Performed by: Eden Goodpasture Total critical care time: 80 minutes Critical care time was exclusive of separately billable procedures and treating other patients. Critical care was necessary to treat or prevent imminent or life-threatening deterioration. Critical care was time spent personally by me on the following activities: development of treatment plan with patient and/or surrogate as well as nursing, discussions with consultants, evaluation of patient's response to treatment, examination of patient, obtaining history from patient or surrogate, ordering and performing treatments and interventions, ordering and review of laboratory studies, ordering and review of radiographic studies, pulse oximetry and re-evaluation of patient's condition.         Final Clinical Impression(s) / ED Diagnoses Final diagnoses:  None    Rx / DC Orders ED Discharge Orders     None         Lind Repine, MD 10/29/23 1058    Lind Repine, MD 10/29/23 1316    Lind Repine, MD 10/29/23 1421    Lind Repine, MD 10/29/23 1423    Lind Repine, MD 10/29/23 1516    Lind Repine,  MD 11/18/23 1625

## 2023-10-29 NOTE — H&P (Signed)
 NAME:  Daniel Whitaker, MRN:  [id number], DOB:  10/08/1945, LOS: 0 ADMISSION DATE:  10/29/2023, CONSULTATION DATE: 10/29/2023 REFERRING MD: Dr , CHIEF COMPLAINT: Respiratory failure  History of Present Illness:  Activated EMS with confusion, fever of 102 and increased fatigue, altered mental status  Increased cough No vomiting or diarrhea Weakness  Was recently treated for pneumonia, recent hospitalization about 2 weeks ago Recently initiated treatment for multiple myeloma -Chemotherapy received 10/28/2023 - Was treated with vancomycin  and cefepime  initially, changed to Levaquin  at discharge with a prednisone  taper - Was discharged on 4 L of oxygen  Diagnosed with multiple myeloma, IgA kappa with multiple bone fractures in 2024 Initially treated with bortezomib  and dexamethasone  Switched to carfilzomib , cyclophosphamide  and dexamethasone  Did subsequently have a respiratory decompensation requiring hospitalization recently   On home oxygen as needed  Deteriorated while in the emergency department, was on high flow nasal cannula but continued to deteriorate, requiring intubation today at about 2 PM  Transferred to [facility] Pertinent  Medical History   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   Nocturnal leg cramps 03/11/2021   Nontoxic uninodular goiter    Obesity    Peripheral neuropathy    RLS (restless legs syndrome)    Spinal stenosis    Unspecified deficiency anemia 07/11/2013    Significant Hospital Events: Including procedures, antibiotic start and stop dates in addition to other pertinent events     Interim History / Subjective:  Patient is intubated,  sedated Unable to provide history  Objective    Blood pressure (!) 83/52, pulse (!) 101, temperature 100 F (37.8 C), resp. rate (!) 26, height 5\' 10"  (1.778 m), weight 90 kg, SpO2 95%.    Vent Mode: PRVC FiO2 (%):  [60 %-100 %] 100 % Set Rate:  [28 bmp] 28 bmp Vt Set:  [550 mL-590 mL] 550 mL PEEP:  [7.5 cmH20-14 cmH20] 14 cmH20 Plateau Pressure:  [29 cmH20] 29 cmH20   Intake/Output Summary (Last 24 hours) at 10/29/2023 1724 Last data filed at 10/29/2023 1714 Gross per 24 hour  Intake 2050.56 ml  Output --  Net 2050.56 ml   Filed Weights   10/29/23 0905  Weight: 90 kg    Examination: General: Chronically ill-appearing HENT: Endotracheal tube in place Lungs: Decreased breath sounds, rhonchi Cardiovascular: S1-S2 appreciated Abdomen: Soft, bowel sounds appreciated Extremities: 2+ edema Neuro: Sedated GU:   I reviewed nursing notes, Consultant notes, hospitalist notes, last 24 h vitals and pain scores, last 48 h intake and output, last 24 h labs and trends, and last 24 h imaging results.  Resolved problem list   Assessment and Plan  Multilobar pneumonia Acute on chronic respiratory failure -Continue mechanical ventilation per ARDS protocol -Target TVol 6-8cc/kgIBW -Target Plateau Pressure < 30cm H20 -Target driving pressure less than 15 cm of water -Ventilator associated pneumonia prevention protocol  Healthcare associated pneumonia - Vancomycin  and cefepime  - Follow blood cultures - Respiratory cultures - MRSA PCR ordered  Septic shock - On Levophed - Titrate to maintain MAP greater than 65  Multiple myeloma - Recently received chemo  History of paroxysmal atrial fibrillation - On Coreg  and  Eliquis   History of RLS - On oxycodone  and Lyrica   History of obstructive sleep apnea - On CPAP therapy  Protein calorie malnutrition - Will initiate tube feeding  Acute kidney injury - Maintain renal perfusion - Avoid nephrotoxic medications - Continue  close monitoring  Best Practice (right click and "Reselect all SmartList Selections" daily)   Diet/type: NPO DVT prophylaxis DOAC Pressure ulcer(s): N/A GI prophylaxis: PPI Lines: N/A Foley:  Yes, and it is still needed Code Status:  full code Last date of multidisciplinary goals of care discussion [pending]  Labs   CBC: Recent Labs  Lab 10/28/23 0949 10/29/23 0853 10/29/23 1403 10/29/23 1527  WBC 9.0 9.6  --   --   NEUTROABS 6.7 8.6*  --   --   HGB 10.3* 11.0* 11.9* 11.6*  HCT 32.6* 35.8* 35.0* 34.0*  MCV 88.1 90.9  --   --   PLT 247 177  --   --     Basic Metabolic Panel: Recent Labs  Lab 10/28/23 0949 10/29/23 0853 10/29/23 1403 10/29/23 1527  NA 141 139 137 138  K 4.0 4.3 5.0 4.6  CL 107 104  --   --   CO2 31 22  --   --   GLUCOSE 96 135*  --   --   BUN 23 32*  --   --   CREATININE 0.59* 1.25*  --   --   CALCIUM  8.6* 8.5*  --   --    GFR: Estimated Creatinine Clearance (by C-G formula based on SCr of 1.25 mg/dL (H)) Male: 09.8 mL/min (A) Male: 55.9 mL/min (A) Recent Labs  Lab 10/28/23 0949 10/29/23 0853 10/29/23 1053  WBC 9.0 9.6  --   LATICACIDVEN  --  1.3 2.4*    Liver Function Tests: Recent Labs  Lab 10/28/23 0949 10/29/23 0853  AST 9* 14*  ALT 10 11  ALKPHOS 140* 144*  BILITOT 0.5 0.4  PROT 5.5* 5.4*  ALBUMIN  3.6 3.6   No results for input(s): "LIPASE", "AMYLASE" in the last 168 hours. No results for input(s): "AMMONIA" in the last 168 hours.  ABG    Component Value Date/Time   PHART 7.279 (L) 10/29/2023 1527   PCO2ART 43.4 10/29/2023 1527   PO2ART 63 (L) 10/29/2023 1527   HCO3 20.2 10/29/2023 1527   TCO2 21 (L) 10/29/2023 1527   ACIDBASEDEF 6.0 (H) 10/29/2023 1527   O2SAT 87 10/29/2023 1527     Coagulation Profile: Recent Labs  Lab 10/29/23 0853  INR 1.5*    Cardiac Enzymes: No results for input(s): "CKTOTAL", "CKMB", "CKMBINDEX", "TROPONINI" in the last 168 hours.  HbA1C: Hgb A1c MFr Bld  Date/Time Value Ref  Range Status  10/15/2023 11:12 AM 5.2 4.8 - 5.6 % Final    Comment:    (NOTE) Pre diabetes:          5.7%-6.4%  Diabetes:              >6.4%  Glycemic control for   <7.0% adults with diabetes   03/20/2022 08:03 AM 5.1 4.8 - 5.6 % Final    Comment:    (NOTE) Pre diabetes:          5.7%-6.4%  Diabetes:              >6.4%  Glycemic control for   <7.0% adults with diabetes     CBG: No results for input(s): "GLUCAP" in the last 168 hours.  Review of Systems:   Unable to provide history  Past Medical History:  He,  has a past medical history of Arthritis, Cataract, Coronary artery disease, Erectile dysfunction, Esophageal reflux, Glaucoma, Hypertriglyceridemia, Impaired fasting glucose, MGUS (monoclonal gammopathy of unknown significance), Neuropathy associated with MGUS (HCC) (07/11/2013), Nocturnal leg cramps (03/11/2021), Nontoxic uninodular goiter, Obesity, Peripheral neuropathy, RLS (restless legs syndrome), Spinal stenosis, and Unspecified deficiency anemia (07/11/2013).   Surgical History:   Past Surgical History:  Procedure Laterality Date   CARDIAC CATHETERIZATION     COLON RESECTION N/A 04/21/2013   Procedure: DIAGNOSTIC LAPAROSCOPY, lysis of adhesions for partial small bowel obstruction;  Surgeon: Harlee Lichtenstein, MD;  Location: [facility] ORS;  Service: General;  Laterality: N/A;   COLON SURGERY     partial SBO    COLONOSCOPY     CORONARY STENT INTERVENTION N/A 10/22/2021   Procedure: CORONARY STENT INTERVENTION;  Surgeon: Arleen Lacer, MD;  Location: [facility] INVASIVE CV LAB;  Service: Cardiovascular;  Laterality: N/A;   IR IMAGING GUIDED PORT INSERTION  09/02/2023   KNEE SURGERY     Arthroscopic   LEFT HEART CATH AND CORONARY ANGIOGRAPHY N/A 10/22/2021   Procedure: LEFT HEART CATH AND CORONARY ANGIOGRAPHY;  Surgeon: Arleen Lacer, MD;  Location: [facility] INVASIVE CV LAB;  Service: Cardiovascular;  Laterality: N/A;   SMALL INTESTINE SURGERY     Blockage     Social  History:   reports that he quit smoking about 39 years ago. His smoking use included cigarettes. He has never used smokeless tobacco. He reports that he does not currently use alcohol after a past usage of about 10.0 standard drinks of alcohol per week. He reports that he does not use drugs.   Family History:  His family history includes Brain cancer in his mother; Dementia in his brother; Heart disease in his brother; Lung cancer in his father. There is no history of Neuropathy, Colon cancer, Colon polyps, Esophageal cancer, Rectal cancer, Stomach cancer, or Sleep apnea.   Allergies Allergies  Allergen Reactions   Penicillins Hives   Duloxetine  Hcl Nausea Only and Other (See Comments)    Drowsiness   Atorvastatin Other (See Comments)    Myalgia      Home Medications  Prior to Admission medications   Medication Sig Start Date End Date Taking? Authorizing Provider  acyclovir  (ZOVIRAX ) 400 MG tablet Take 1 tablet (400 mg total) by mouth daily. 06/09/23   [name], MD  albuterol  (VENTOLIN  HFA) 108 (90 Base) MCG/ACT inhaler Inhale 1-2 puffs into the lungs every 6 (six) hours as needed for wheezing or shortness of breath.    [provider]  apixaban  (ELIQUIS ) 5 MG TABS tablet Take 1 tablet (5 mg total) by mouth 2 (two) times daily. 08/20/23   Barbee Lew, MD  brimonidine  (ALPHAGAN ) 0.2 % ophthalmic solution Place 1 drop into the left eye in the morning and at bedtime. 01/08/20   [provider]  calcium  carbonate (TUMS - DOSED IN MG ELEMENTAL CALCIUM ) 500 MG chewable tablet Chew 1 tablet by mouth daily.    [provider]  carvedilol  (COREG ) 3.125 MG tablet Tale 1/2 tab twice daily 10/28/23   [name], MD  cetirizine (ZYRTEC) 10 MG tablet Take 10 mg by mouth every morning.    [provider]  DENTA 5000 PLUS 1.1 % CREA dental cream Place 1 Application onto teeth at bedtime. 06/08/23   [provider]  docusate sodium  (COLACE) 100 MG capsule  Take 300 mg by mouth daily.    [provider]  dorzolamide -timolol  (COSOPT ) 22.3-6.8 MG/ML ophthalmic solution Place 1 drop into both eyes 2 (two) times daily. 03/09/18   [provider]  ketoconazole (NIZORAL) 2 % cream Apply 1 application  topically daily as needed for irritation. 11/28/14   [provider]  latanoprost  (XALATAN ) 0.005 % ophthalmic solution Place 1 drop into both eyes at bedtime. 03/01/13   [provider]  lidocaine -prilocaine  (EMLA ) cream Apply to affected area once Patient taking differently: Apply 1 Application topically 2 (two) times daily as needed (Pain). Port for treatment 09/01/23   [name], MD  morphine  (MS CONTIN ) 15 MG 12 hr tablet Take 1 tablet (15 mg total) by mouth at bedtime. Patient taking differently: Take 15 mg by mouth at bedtime as needed for pain. 08/25/23   [name], MD  Multiple Vitamins-Minerals (CENTRUM SILVER 50+MEN) TABS Take 1 tablet by mouth daily with breakfast. Patient not taking: Reported on 10/15/2023    [provider]  nitroGLYCERIN  (NITROSTAT ) 0.4 MG SL tablet Place 1 tablet (0.4 mg total) under the tongue every 5 (five) minutes as needed. Patient taking differently: Place 0.4 mg under the tongue every 5 (five) minutes as needed for chest pain. 10/22/21   Sanjuanita Cruz, NP  ondansetron  (ZOFRAN ) 8 MG tablet Take 1 tablet (8 mg total) by mouth every 8 (eight) hours as needed for nausea or vomiting. 05/11/23   [name], MD  oxyCODONE  (ROXICODONE ) 15 MG immediate release tablet Take 1 tablet (15 mg total) by mouth every 4 (four) hours as needed for severe pain (pain score 7-10). 09/07/23   [name], MD  pantoprazole  (PROTONIX ) 40 MG tablet Take 40 mg by mouth daily before breakfast. 03/14/13   [provider]  polyethylene glycol (MIRALAX  / GLYCOLAX ) 17 g packet Take 17 g by mouth 2 (two) times daily. 07/30/23   [name], MD  pregabalin  (LYRICA ) 100 MG capsule TAKE 1 CAPSULE BY MOUTH  IN THE MORNING, AND AT NOON, AND 2 CAPSULES AT NIGHT Patient taking differently: Take 100-200 mg by mouth 2 (two) times daily. TAKE 1 CAPSULE BY MOUTH IN THE MORNING AND 2 CAPSULES AT NIGHT 10/05/23   Millikan, Megan, NP  prochlorperazine  (COMPAZINE ) 10 MG tablet Take 1 tablet (10 mg total) by mouth every 6 (six) hours as needed for nausea or vomiting. 05/11/23   [name], MD  rosuvastatin  (CRESTOR ) 40 MG tablet Take 40 mg by mouth in the morning and at bedtime. Patient not taking: Reported on 10/15/2023    [provider]  senna-docusate (SENOKOT-S) 8.6-50 MG tablet Take 2 tablets by mouth 2 (two) times daily. Patient taking differently: Take 2 tablets by mouth 2 (two) times daily as needed for moderate constipation. 07/30/23   [name], MD  triamcinolone  (NASACORT ) 55 MCG/ACT AERO nasal inhaler Place 1 spray into the nose daily as needed. 09/03/23   Audria Leather, MD  TYLENOL  500 MG tablet Take 1,000 mg by mouth every 6 (six) hours as needed for mild pain (pain score 1-3) or headache.    [provider]    The patient is critically ill with multiple organ systems failure and requires high complexity decision making for assessment and support, frequent evaluation and titration of therapies, application of advanced monitoring technologies and extensive interpretation of multiple databases. Critical Care Time devoted to patient care services described in this note independent of APP/resident time (if applicable)  is 45 minutes.   Myer Artis MD [facility] Personal pager: See Amion If unanswered, please  page CCM On-call: #[phone]

## 2023-10-29 NOTE — ED Notes (Signed)
 Report called to Amelie Baize, RN at [facility] ICU

## 2023-10-29 NOTE — ED Notes (Signed)
 Outside to update the family about patient's condition. Resting comfortably on the ventilator. Answered all questions is the time. Will allow visitors after cleaning up the room.

## 2023-10-29 NOTE — Telephone Encounter (Signed)
 Returned call to [employer]. Daniel Whitaker is having shaking, low grade fever, confusion and more shortness of breath. Per Dr. Marton Sleeper, instructed to go to the ER now. [name] verbalized understanding.

## 2023-10-29 NOTE — ED Notes (Addendum)
 Called to patient's room by son who stated patient was less responsive; patient's breathing more labored and wet, RT at bedside with patient placed on BiPap at provider order after provider notified.  Provider discussed with family possibility of intubating patient and grandson became upset and wanted to delay, to let patient remain on BiPap to see if pt improved.  Dietra Fraction was argumentative with provider as to best way to proceed, as he said in the past providers have been too quick to intubate, felt it difficult for patient to recover.    RT agreed with provider, that in his observation patient unable to protect airway at this time.  Family discussed and agreed to proceed with intubation.

## 2023-10-29 NOTE — Progress Notes (Addendum)
 [facility]Link Physician-Brief Progress Note Patient Name: Daniel Whitaker DOB: 03-21-1946 MRN: [id number]   Date of Service  10/29/2023  HPI/Events of Note  Clarified fluid orders-currently has LR and NS ordered but neither of them are running  eICU Interventions  Hold fluids for now, orders discontinued   2238 -notified that the patient had rapid de-escalation and has respiratory status.  We tried adjusting ventilator to pressure control, uptitrate the PEEP, and sedate the patient further but he had a decline in his blood pressure requiring addition of vasopressin  and epinephrine .  Will place an A-line.  Obtain ABG post.  Chest radiograph pending.  Given the rapid escalation and high propensity for decompensation, ground team evaluation was requested.  No availability of inhaled nitric oxide or epoprostenol  at the facility per RT, may be able to do epoprostenol  by requesting assistance from [facility] RT.  I spoke to the patient's wife who is at bedside for these events and advised her that the patient is declining rapidly.  I talked to patient's grandson and remaining family on speaker phone-advised changed to DNR and they agreed that this would be in his best interest.  Continue current care, no chest compressions or shock if he becomes pulseless.   0136 -critical pH reported after proning.  pH 7.17, pCO2 52, PO2 improved to 98.  This indicates worsening metabolic acidosis.  Will initiate stress dose steroids, increase rate of bicarbonate infusion.  Calcium  supplementation.  Worsening radiographically as well.  Maintain epoprostenol .  Repeat ABG per prone protocol or for significant hemodynamic changes.  Intervention Category Minor Interventions: Routine modifications to care plan (e.g. PRN medications for pain, fever)  [name] 10/29/2023, 9:23 PM

## 2023-10-29 NOTE — ED Notes (Signed)
 Patient experiencing SOB and AMS. Myself and Siegfried Dress, RRT at bedside. SOB worsened and lung sounds courser. Dr. Leighton Punches in to evaluate patient. RBVO for BiPAP, stop NS bolus, order stat CXR, and administer Lasix  40 mg IV now. Orders currently being carried out.

## 2023-10-29 NOTE — ED Notes (Signed)
 Report to Katelyn, with [facility]; ETA 2-3 minutes

## 2023-10-29 NOTE — Progress Notes (Signed)
Provider at bedside placing central access

## 2023-10-29 NOTE — ED Notes (Signed)
   10/29/23 0940  Oxygen Therapy/Pulse Ox  O2 Device HHFNC  $ Heated High Flow Nasal Cannula  Yes  Heated High Flow Nasal Cannula  Adult Medium  $ Adult Medium Yes  O2 Therapy Oxygen humidified  Heater temperature 95 F (35 C)  O2 Flow Rate (L/min) 45 L/min  FiO2 (%) 60 %   Placed on HHFNC, flow and O2% titrated to patient comfort and SpO2 92%or>. Will desat into high 80s with any activity.  RT to monitor.

## 2023-10-29 NOTE — Progress Notes (Signed)
 Pharmacy Antibiotic Note  Daniel Whitaker is a 78 y.o. adult admitted on 10/29/2023 with pneumonia.  Pharmacy has been consulted for vancomycin  and cefepime   dosing.  Plan: Cefepime  2 gr IV q12h  Vancomycin  2000 IV given in ED then vancomycin  1500 mg IV q24h (AUC 495, Scr 1.25 mg/dl, wt 90 kg)  Monitor for signs and symptoms of bleeding   Height: 5\' 10"  (177.8 cm) Weight: 90 kg (198 lb 6.6 oz) IBW/kg (Calculated) : 73  Temp (24hrs), Avg:100.5 F (38.1 C), Min:98.6 F (37 C), Max:103.4 F (39.7 C)  Recent Labs  Lab 10/28/23 0949 10/29/23 0853 10/29/23 1053  WBC 9.0 9.6  --   CREATININE 0.59* 1.25*  --   LATICACIDVEN  --  1.3 2.4*    Estimated Creatinine Clearance (by C-G formula based on SCr of 1.25 mg/dL (H)) Male: 16.1 mL/min (A) Male: 55.9 mL/min (A)    Allergies  Allergen Reactions   Penicillins Hives   Duloxetine  Hcl Nausea Only and Other (See Comments)    Drowsiness   Atorvastatin Other (See Comments)    Myalgia     Antimicrobials this admission: 5/30 vancomycin  >>  5/30 cefepime  >>   Dose adjustments this admission:   Microbiology results: 5/30 BCx:  5/30 RVP:  5/30 MRSA PCR:    Van Gelinas, PharmD, BCPS 10/29/2023 6:23 PM

## 2023-10-29 NOTE — ED Notes (Signed)
 Patient not maintaining his airway on BiPAP Dr. Leighton Punches recommends intubation. Family hesitant to elect for intubation at this time. Wishes to attempt to reverse his pain medication. Will try Narcan  first. Family noted that this will delay in intubation coudl potentially cause worsening of condition. During this discussion patient's family member was agitated, animated, and loudly voiced his needs/opinion. Dr. Leighton Punches calmly and actively listened to the family concerns and adjusted treatment accordantly, such as delaying intubation.

## 2023-10-29 NOTE — ED Notes (Signed)
 Administered Narcan  2 mg IVP per RBVO Dr. Leighton Punches at bedside.

## 2023-10-29 NOTE — ED Triage Notes (Addendum)
 Patient arrives POV with complaints of confusion, fever (high of 102F), and increased fatigue. Recovering from pneumonia and patient recently had an hospital admission.   Multiple myeloma and getting chemo treatments. Accompanied by his family.

## 2023-10-29 NOTE — Plan of Care (Signed)
  Problem: Clinical Measurements: Goal: Ability to maintain clinical measurements within normal limits will improve Outcome: Not Progressing Goal: Will remain free from infection Outcome: Not Progressing Goal: Respiratory complications will improve Outcome: Not Progressing   Problem: Coping: Goal: Level of anxiety will decrease Outcome: Not Progressing   Problem: Pain Managment: Goal: General experience of comfort will improve and/or be controlled Outcome: Not Progressing

## 2023-10-29 NOTE — Progress Notes (Signed)
Updated family members at bedside °

## 2023-10-29 NOTE — Progress Notes (Signed)
 Rapid titration of epi gtt d/t profound hypotension. Rito Chess, MD on Elink screen, Mason Sole, MD at bedside

## 2023-10-29 NOTE — ED Notes (Signed)
 Patient's respiratory and mental status has not improved. Family elects at this time to intubate the patient.

## 2023-10-29 NOTE — ED Notes (Signed)
Pt unable to provide a urine sample at this time. Specimen cup at bedside.

## 2023-10-30 ENCOUNTER — Inpatient Hospital Stay (HOSPITAL_COMMUNITY)

## 2023-10-30 DIAGNOSIS — R0600 Dyspnea, unspecified: Secondary | ICD-10-CM | POA: Diagnosis not present

## 2023-10-30 LAB — BLOOD GAS, ARTERIAL
Acid-base deficit: 9.8 mmol/L — ABNORMAL HIGH (ref 0.0–2.0)
Acid-base deficit: 6.9 mmol/L — ABNORMAL HIGH (ref 0.0–2.0)
Acid-base deficit: 7 mmol/L — ABNORMAL HIGH (ref 0.0–2.0)
Bicarbonate: 19 mmol/L — ABNORMAL LOW (ref 20.0–28.0)
Bicarbonate: 18.5 mmol/L — ABNORMAL LOW (ref 20.0–28.0)
Bicarbonate: 18.6 mmol/L — ABNORMAL LOW (ref 20.0–28.0)
Bicarbonate: 23.1 mmol/L (ref 20.0–28.0)
Drawn by: 11249
Drawn by: 11249
Drawn by: 20012
FIO2: 100 %
FIO2: 100 %
FIO2: 80 %
MECHVT: 650 mL
MECHVT: 650 mL
MECHVT: 650 mL
O2 Content: 80 L/min
O2 Saturation: 98.9 %
O2 Saturation: 99.8 %
O2 Saturation: 100 %
O2 Saturation: 100 %
PEEP: 22 cmH2O
PEEP: 22 cmH2O
PEEP: 22 cmH2O
Patient temperature: 37.2
Patient temperature: 38.4
Patient temperature: 37
Patient temperature: 37.4
RATE: 30 {breaths}/min
RATE: 30 {breaths}/min
RATE: 30 {breaths}/min
pCO2 arterial: 52 mmHg — ABNORMAL HIGH (ref 32–48)
pCO2 arterial: 38 mmHg (ref 32–48)
pCO2 arterial: 37 mmHg (ref 32–48)
pCO2 arterial: 41 mmHg (ref 32–48)
pH, Arterial: 7.17 — CL (ref 7.35–7.45)
pH, Arterial: 7.3 — ABNORMAL LOW (ref 7.35–7.45)
pH, Arterial: 7.31 — ABNORMAL LOW (ref 7.35–7.45)
pH, Arterial: 7.36 (ref 7.35–7.45)
pO2, Arterial: 98 mmHg (ref 83–108)
pO2, Arterial: 155 mmHg — ABNORMAL HIGH (ref 83–108)
pO2, Arterial: 197 mmHg — ABNORMAL HIGH (ref 83–108)
pO2, Arterial: 180 mmHg — ABNORMAL HIGH (ref 83–108)

## 2023-10-30 LAB — CBC
HCT: 38.5 % — ABNORMAL LOW (ref 39.0–52.0)
HCT: 35.5 % — ABNORMAL LOW (ref 39.0–52.0)
Hemoglobin: 11.4 g/dL — ABNORMAL LOW (ref 13.0–17.0)
Hemoglobin: 11.2 g/dL — ABNORMAL LOW (ref 13.0–17.0)
MCH: 27.9 pg (ref 26.0–34.0)
MCH: 28.7 pg (ref 26.0–34.0)
MCHC: 29.6 g/dL — ABNORMAL LOW (ref 30.0–36.0)
MCHC: 31.5 g/dL (ref 30.0–36.0)
MCV: 94.1 fL (ref 80.0–100.0)
MCV: 91 fL (ref 80.0–100.0)
Platelets: 299 10*3/uL (ref 150–400)
Platelets: 165 10*3/uL (ref 150–400)
RBC: 4.09 MIL/uL — ABNORMAL LOW (ref 4.22–5.81)
RBC: 3.9 MIL/uL — ABNORMAL LOW (ref 4.22–5.81)
RDW: 18.1 % — ABNORMAL HIGH (ref 11.5–15.5)
RDW: 18 % — ABNORMAL HIGH (ref 11.5–15.5)
WBC: 18.1 10*3/uL — ABNORMAL HIGH (ref 4.0–10.5)
WBC: 18.9 10*3/uL — ABNORMAL HIGH (ref 4.0–10.5)
nRBC: 0.3 % — ABNORMAL HIGH (ref 0.0–0.2)
nRBC: 0.2 % (ref 0.0–0.2)

## 2023-10-30 LAB — PHOSPHORUS
Phosphorus: 4.9 mg/dL — ABNORMAL HIGH (ref 2.5–4.6)
Phosphorus: 4.4 mg/dL (ref 2.5–4.6)

## 2023-10-30 LAB — BASIC METABOLIC PANEL WITH GFR
Anion gap: 17 — ABNORMAL HIGH (ref 5–15)
BUN: 46 mg/dL — ABNORMAL HIGH (ref 8–23)
CO2: 19 mmol/L — ABNORMAL LOW (ref 22–32)
Calcium: 6.7 mg/dL — ABNORMAL LOW (ref 8.9–10.3)
Chloride: 100 mmol/L (ref 98–111)
Creatinine, Ser: 2.02 mg/dL — ABNORMAL HIGH (ref 0.61–1.24)
GFR, Estimated: 33 mL/min — ABNORMAL LOW (ref >60)
Glucose, Bld: 190 mg/dL — ABNORMAL HIGH (ref 70–99)
Potassium: 4.5 mmol/L (ref 3.5–5.1)
Sodium: 136 mmol/L (ref 135–145)

## 2023-10-30 LAB — LACTIC ACID, PLASMA
Lactic Acid, Venous: 7.3 mmol/L (ref 0.5–1.9)
Lactic Acid, Venous: 6.5 mmol/L (ref 0.5–1.9)
Lactic Acid, Venous: 5.2 mmol/L (ref 0.5–1.9)

## 2023-10-30 LAB — COMPREHENSIVE METABOLIC PANEL WITH GFR
ALT: 13 U/L (ref 0–44)
AST: 25 U/L (ref 15–41)
Albumin: 2.3 g/dL — ABNORMAL LOW (ref 3.5–5.0)
Alkaline Phosphatase: 98 U/L (ref 38–126)
Anion gap: 13 (ref 5–15)
BUN: 40 mg/dL — ABNORMAL HIGH (ref 8–23)
CO2: 21 mmol/L — ABNORMAL LOW (ref 22–32)
Calcium: 6.1 mg/dL — CL (ref 8.9–10.3)
Chloride: 105 mmol/L (ref 98–111)
Creatinine, Ser: 1.94 mg/dL — ABNORMAL HIGH (ref 0.61–1.24)
GFR, Estimated: 35 mL/min — ABNORMAL LOW (ref >60)
Glucose, Bld: 153 mg/dL — ABNORMAL HIGH (ref 70–99)
Potassium: 4 mmol/L (ref 3.5–5.1)
Sodium: 139 mmol/L (ref 135–145)
Total Bilirubin: 0.9 mg/dL (ref 0.0–1.2)
Total Protein: 4.5 g/dL — ABNORMAL LOW (ref 6.5–8.1)

## 2023-10-30 LAB — TRIGLYCERIDES: Triglycerides: 332 mg/dL — ABNORMAL HIGH (ref <150)

## 2023-10-30 LAB — GLUCOSE, CAPILLARY
Glucose-Capillary: 119 mg/dL — ABNORMAL HIGH (ref 70–99)
Glucose-Capillary: 159 mg/dL — ABNORMAL HIGH (ref 70–99)
Glucose-Capillary: 192 mg/dL — ABNORMAL HIGH (ref 70–99)
Glucose-Capillary: 167 mg/dL — ABNORMAL HIGH (ref 70–99)
Glucose-Capillary: 86 mg/dL (ref 70–99)

## 2023-10-30 LAB — MAGNESIUM
Magnesium: 1.8 mg/dL (ref 1.7–2.4)
Magnesium: 2.1 mg/dL (ref 1.7–2.4)
Magnesium: 2.1 mg/dL (ref 1.7–2.4)

## 2023-10-30 MED ORDER — ARTIFICIAL TEARS OPHTHALMIC OINT
1.0000 | TOPICAL_OINTMENT | Freq: Three times a day (TID) | OPHTHALMIC | Status: DC
  Administered 2023-10-30 – 2023-10-31 (×6): 1 via OPHTHALMIC
  Filled 2023-10-30: qty 3.5

## 2023-10-30 MED ORDER — VITAL 1.5 CAL PO LIQD
1000.0000 mL | ORAL | Status: DC
  Administered 2023-10-30 – 2023-11-01 (×3): 1000 mL
  Filled 2023-10-30 (×4): qty 1000

## 2023-10-30 MED ORDER — APIXABAN 5 MG PO TABS
5.0000 mg | ORAL_TABLET | Freq: Two times a day (BID) | ORAL | Status: DC
  Administered 2023-10-30 – 2023-10-31 (×4): 5 mg
  Filled 2023-10-30 (×4): qty 1

## 2023-10-30 MED ORDER — CALCIUM GLUCONATE-NACL 2-0.675 GM/100ML-% IV SOLN
2.0000 g | Freq: Once | INTRAVENOUS | Status: AC
  Administered 2023-10-30: 2000 mg via INTRAVENOUS
  Filled 2023-10-30: qty 100

## 2023-10-30 MED ORDER — STERILE WATER FOR INJECTION IJ SOLN
50.0000 ng/kg/min | INTRAVENOUS | Status: DC
  Administered 2023-10-30 – 2023-10-31 (×9): 50 ng/kg/min via RESPIRATORY_TRACT
  Filled 2023-10-30 (×11): qty 5

## 2023-10-30 MED ORDER — LINEZOLID 600 MG/300ML IV SOLN
600.0000 mg | Freq: Two times a day (BID) | INTRAVENOUS | Status: DC
  Administered 2023-10-31 (×2): 600 mg via INTRAVENOUS
  Filled 2023-10-30 (×3): qty 300

## 2023-10-30 MED ORDER — PROSOURCE TF20 ENFIT COMPATIBL EN LIQD
60.0000 mL | Freq: Two times a day (BID) | ENTERAL | Status: DC
  Administered 2023-10-31 (×2): 60 mL
  Filled 2023-10-30 (×2): qty 60

## 2023-10-30 MED ORDER — HYDROCORTISONE SOD SUC (PF) 100 MG IJ SOLR
50.0000 mg | Freq: Four times a day (QID) | INTRAMUSCULAR | Status: DC
  Administered 2023-10-30 – 2023-11-01 (×10): 50 mg via INTRAVENOUS
  Filled 2023-10-30 (×10): qty 2

## 2023-10-30 MED ORDER — FENTANYL 2500MCG IN NS 250ML (10MCG/ML) PREMIX INFUSION
25.0000 ug/h | INTRAVENOUS | Status: DC
  Administered 2023-10-30: 125 ug/h via INTRAVENOUS
  Administered 2023-10-30: 200 ug/h via INTRAVENOUS
  Administered 2023-10-31: 125 ug/h via INTRAVENOUS
  Administered 2023-11-01: 175 ug/h via INTRAVENOUS
  Filled 2023-10-30 (×4): qty 250

## 2023-10-30 MED ORDER — FENTANYL CITRATE PF 50 MCG/ML IJ SOSY: 25.0000 ug | PREFILLED_SYRINGE | Freq: Once | INTRAMUSCULAR | Status: DC

## 2023-10-30 MED ORDER — ACETAMINOPHEN 500 MG PO TABS
1000.0000 mg | ORAL_TABLET | ORAL | Status: DC | PRN
  Administered 2023-10-30 – 2023-10-31 (×4): 1000 mg
  Filled 2023-10-30 (×4): qty 2

## 2023-10-30 MED ORDER — THIAMINE MONONITRATE 100 MG PO TABS
100.0000 mg | ORAL_TABLET | Freq: Every day | ORAL | Status: DC
  Administered 2023-10-31: 100 mg
  Filled 2023-10-30: qty 1

## 2023-10-30 MED ORDER — CISATRACURIUM BOLUS VIA INFUSION
0.0500 mg/kg | Freq: Once | INTRAVENOUS | Status: AC
  Administered 2023-10-30: 4.5 mg via INTRAVENOUS
  Filled 2023-10-30: qty 5

## 2023-10-30 MED ORDER — FENTANYL BOLUS VIA INFUSION
25.0000 ug | INTRAVENOUS | Status: DC | PRN
  Administered 2023-10-31: 25 ug via INTRAVENOUS

## 2023-10-30 MED ORDER — PROPOFOL 1000 MG/100ML IV EMUL
25.0000 ug/kg/min | INTRAVENOUS | Status: DC
  Administered 2023-10-30 (×2): 25 ug/kg/min via INTRAVENOUS
  Filled 2023-10-30: qty 100

## 2023-10-30 MED ORDER — MAGNESIUM SULFATE 2 GM/50ML IV SOLN
2.0000 g | Freq: Once | INTRAVENOUS | Status: AC
  Administered 2023-10-30: 2 g via INTRAVENOUS
  Filled 2023-10-30: qty 50

## 2023-10-30 MED ORDER — SODIUM CHLORIDE 0.9% FLUSH: 10.0000 mL | INTRAVENOUS | Status: DC | PRN

## 2023-10-30 MED ORDER — VANCOMYCIN HCL IN DEXTROSE 1-5 GM/200ML-% IV SOLN
1000.0000 mg | INTRAVENOUS | Status: DC
  Administered 2023-10-30: 1000 mg via INTRAVENOUS
  Filled 2023-10-30: qty 200

## 2023-10-30 MED ORDER — SODIUM CHLORIDE 0.9 % IV SOLN
0.0000 ug/kg/min | INTRAVENOUS | Status: DC
  Administered 2023-10-30 (×2): 3 ug/kg/min via INTRAVENOUS
  Filled 2023-10-30 (×3): qty 20

## 2023-10-30 NOTE — Progress Notes (Addendum)
 PHARMACY NOTE:  ANTIMICROBIAL RENAL DOSAGE ADJUSTMENT  Current antimicrobial regimen includes a mismatch between antimicrobial dosage and estimated renal function.  As per policy approved by the Pharmacy & Therapeutics and Medical Executive Committees, the antimicrobial dosage will be adjusted accordingly.  Current antimicrobial dosage:  vancomycin  1500mg  IV q24h  Indication: sepsis, PNA  Renal Function:  Estimated Creatinine Clearance (by C-G formula based on SCr of 2.02 mg/dL (H)) Male: 16.1 mL/min (A) Male: 35.2 mL/min (A) []      On intermittent HD, scheduled: []      On CRRT    Antimicrobial dosage has been changed to:  vancomycin  1000mg  IV q24h (eAUC 483 using Scr 2.02, Vd 0.72)  Additional comments: - Patient's BMI is borderline 30--watch for need to adjust Vd from 0.72 to 0.5.   Addendum: After discussion with MD, will transition from vanc to linezolid therapy given the patient's continued decline in renal function (Scr 0.59 >> 2.02 since admission).   Thank you for allowing pharmacy to be a part of this patient's care.  Roselee Cong, PharmD Clinical Pharmacist  5/31/202512:03 PM

## 2023-10-30 NOTE — TOC Initial Note (Signed)
 Transition of Care [facility]) - Initial/Assessment Note    Patient Details  Name: Daniel Whitaker MRN: [id number] Date of Birth: Oct 16, 1945  Transition of Care [facility]) CM/SW Contact:    Amaryllis Junior, LCSW Phone Number: 10/30/2023, 11:58 AM  Clinical Narrative:                 Pt from home with spouse. Pt currently unresponsive. Pt continues medical workup. TOC following for dc needs.     Barriers to Discharge: Continued Medical Work up   Patient Goals and CMS Choice Patient states their goals for this hospitalization and ongoing recovery are:: return home CMS Medicare.gov Compare Post Acute Care list provided to::  (NA) Choice offered to / list presented to : NA [place] ownership interest in [facility].provided to::  (NA)    Expected Discharge Plan and Services In-house Referral: NA Discharge Planning Services: NA   Living arrangements for the past 2 months: Single Family Home                 DME Arranged: N/A DME Agency: NA       HH Arranged: NA HH Agency: NA        Prior Living Arrangements/Services Living arrangements for the past 2 months: Single Family Home Lives with:: Spouse Patient language and need for interpreter reviewed:: Yes Do you feel safe going back to the place where you live?: Yes      Need for Family Participation in Patient Care: Yes (Comment) Care giver support system in place?: Yes (comment)   Criminal Activity/Legal Involvement Pertinent to Current Situation/Hospitalization: No - Comment as needed  Activities of Daily Living      Permission Sought/Granted                  Emotional Assessment Appearance:: Appears stated age       Alcohol / Substance Use: Not Applicable Psych Involvement: No (comment)  Admission diagnosis:  Respiratory failure (HCC) [J96.90] HCAP (healthcare-associated pneumonia) [J18.9] Dyspnea, unspecified type [R06.00] Acute hypoxemic respiratory failure (HCC) [J96.01] Patient Active  Problem List   Diagnosis Date Noted   HCAP (healthcare-associated pneumonia) 10/29/2023   Acute hypoxemic respiratory failure (HCC) 10/29/2023   Malnutrition of moderate degree 10/20/2023   Respiratory failure (HCC) 10/15/2023   Multifocal pneumonia 10/15/2023   Shortness of breath on exertion 10/14/2023   Hypocalcemia 10/07/2023   Weight loss 09/30/2023   Paroxysmal atrial fibrillation (HCC) 08/25/2023   Demand ischemia (HCC) 08/18/2023   Sepsis due to pneumonia (HCC) 08/16/2023   Atrial fibrillation with RVR (HCC) 08/16/2023   Chronic hypoxic respiratory failure (HCC) 08/16/2023   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) 08/16/2023   Poor dentition 08/04/2023   Mild protein-calorie malnutrition (HCC) 07/30/2023   Fluid overload 07/23/2023   Acute on chronic hypoxic respiratory failure (HCC) 07/23/2023   Pressure injury of skin 07/23/2023   URI (upper respiratory infection) 07/23/2023   Reactive airway disease 07/23/2023   Glaucoma 07/23/2023   Pancytopenia, acquired (HCC) 07/09/2023   Decubitus ulcer of buttock, stage 1 07/09/2023   Orthopnea 07/02/2023   Right leg pain 06/18/2023   Acquired hypogammaglobulinemia (HCC) 06/13/2023   Post-nasal drip 06/03/2023   Other constipation 05/21/2023   Cancer related pain 05/21/2023   Hypercalcemia 05/03/2023   AKI (acute kidney injury) (HCC) 05/03/2023   Rib fractures 05/03/2023   Atelectasis 05/03/2023   Unilateral primary osteoarthritis, left knee 06/08/2022   Essential hypertension 05/03/2022   Anemia of chronic  disease 03/27/2022   Restless leg syndrome 01/19/2022   Gait abnormality 01/19/2022   Obstructive sleep apnea 01/19/2022   CAD (coronary artery disease) 10/22/2021   Hyperlipidemia 10/22/2021   Class 1 obesity due to excess calories in adult 03/20/2021   Nocturnal leg cramps 03/11/2021   Lumbar radiculopathy 03/15/2017   Multiple myeloma (HCC) 07/11/2013   Deficiency anemia 07/11/2013    Hypokalemia 04/15/2013   Hyperglycemia 04/15/2013   Neuropathy 04/15/2013   Hereditary and idiopathic peripheral neuropathy 04/06/2013   Cervical spondylosis 12/06/2012   PCP:  Glena Landau, MD Pharmacy:   [facility] - [place], [place] - [address] [place] [address] Phone: [phone] Fax: [phone]     Social Drivers of Health (SDOH) Social History: SDOH Screenings   Food Insecurity: No Food Insecurity (10/29/2023)  Housing: Low Risk  (10/29/2023)  Transportation Needs: No Transportation Needs (10/29/2023)  Utilities: Not At Risk (10/29/2023)  Depression (PHQ2-9): Low Risk  (03/12/2022)  Social Connections: Moderately Integrated (10/29/2023)  Tobacco Use: Medium Risk (10/29/2023)   SDOH Interventions:     Readmission Risk Interventions    10/20/2023   10:48 AM 08/17/2023    8:48 AM 05/07/2023   10:37 AM  Readmission Risk Prevention Plan  Transportation Screening Complete Complete Complete  PCP or Specialist Appt within 5-7 Days   Complete  Home Care Screening   Complete  Medication Review (RN CM)   Complete  Medication Review (RN Care Manager) Referral to Pharmacy Complete   PCP or Specialist appointment within 3-5 days of discharge Complete Complete   HRI or Home Care Consult Complete Complete   SW Recovery Care/Counseling Consult Complete Complete   Palliative Care Screening Not Applicable Not Applicable   Skilled Nursing Facility Not Applicable Not Applicable

## 2023-10-30 NOTE — Progress Notes (Signed)
 eLink Physician-Brief Progress Note Patient Name: Daniel Whitaker DOB: Aug 11, 1945 MRN: [id number]   Date of Service  10/30/2023  HPI/Events of Note  PF ratio improved, patient is does not use the ventilator, not planning tonight.  Requesting an order to discontinue nimbex  although the range on Nimbex  is 0-79mcg per hour and the team discussed discontinuing it earlier.  eICU Interventions  Discontinue paralytic order.    7829 -initially, team was concerned that the patient had a audible air leak.  The patient had been repositioning, additional air was added to the cough, and tubing was replaced on the ventilator side.  At the time of examination, the patient has retained volumes on inspiratory and expiratory loops.  With flexion of his neck, the patient develops an air leak up to 100-150 cc.  Once the patient is relaxed, airleak disappears.  No significant vent dyssynchrony throughout this.  This is concerning for malpositioning of the endotracheal tube.  Will obtain chest radiograph.    Intervention Category Minor Interventions: Agitation / anxiety - evaluation and management  Toma Arts 10/30/2023, 8:24 PM

## 2023-10-30 NOTE — Progress Notes (Addendum)
 Progress Note  I was called by Elink that the patient is declining rapidly and may code.  When I got to bedside patient was being bagged manually and O2 Sats were 68%.  He was on 3 pressors-Epi 10 mcg, Levophed  40 mcg and Vaso 0.03 units.   A cxr was done showing diffuse infiltrates and given clinical scenario-patient was in severe ARDS. On exam Patient sedated oin mech vent Vital as above and he was afebrile, BP with A line 90 systolic Pupils equal but not reactive no icterus Supple no jvd no tm Course bs b/l no wheezes Reg s1s2 no murmurs gallops or rubs Soft nt nd bs pos No cyanosis, clubbing or edema, cool to touch extremities Sedated  Labs/films reviewed  A/p ARDS Acute hypoxemic respiratory failure Septic shock Pneumonia H/o Multiple Myeloma with recent chemo resulting in immunocompromise state  Patient was paralyzed and proned x 18 hours He was placed back on the vent and his PEEP was increased from 16 to 22. Family switched code status to DNR but continue with current measures, no CPR Continue with alveolar recruitment but at some point will need to decrease TV to ARDS net 6cc/kg IBW Very low P to F ratio Bicarb drip started after IVP x 2 Epoprostenol  started Central line placed-see note Case d/w family who at bedside Prognosis is poor overall.  The patient is critically ill with multiple organ system failure and requires high complexity decision making for assessment and support, frequent evaluation and titration of therapies, advanced monitoring, review of radiographic studies and interpretation of complex data.   Critical Care Time devoted to patient care services, exclusive of separately billable procedures, described in this note is 90 minutes.   Claven Cumming, MD [facility] See Amion for pager  If no response to pager , please call [phone] until 7pm After 7:00 pm call Elink  [phone] 10/30/2023, 2:05 AM

## 2023-10-30 NOTE — Progress Notes (Signed)
 ABG noted  PF ratio 225  Does not need to be proned based off of the gas  Will also stop Nimbex  at present

## 2023-10-30 NOTE — Progress Notes (Signed)
 PATIENT STARTED TO DESAT AND I WAS CALLED DOWN TO THE ICU. I TOOK PATIENT OFF AND MANUALLY VENTILATED HIM. HE CONTINUOUSLY GOT WORSE. THE DOCTOR CAME IN, AND WE STARTED EPOPROSTENOL  AT 0015.

## 2023-10-30 NOTE — Progress Notes (Signed)
 Initial Nutrition Assessment  DOCUMENTATION CODES:   Not applicable  INTERVENTION:   -TF via OGT:   Initiate Vital 1.5 @ 20 ml/hr and increase by 10 ml every 8 hours to goal rate of 70 ml/hr.   60 ml Prosource TF BID  Tube feeding regimen provides 2680 kcal (100% of needs), 153 grams of protein, and 1284 ml of H2O.    -100 mg thiamine daily x 7 days -Monitor Mg, K, and Phos and replete as needed secondary to high refeeding risk  NUTRITION DIAGNOSIS:   Inadequate oral intake related to inability to eat as evidenced by NPO status.  GOAL:   Patient will meet greater than or equal to 90% of their needs  MONITOR:   TF tolerance, Vent status  REASON FOR ASSESSMENT:   Consult Enteral/tube feeding initiation and management  ASSESSMENT:   Pt with PMH of multiple mylemona on chemotherapy, CAD, HTN, HLD, and a-fib. Admitted for HCAP. Chest x-ray concerning for ARDS.  Pt admitted with HCAP.  5/30- intubated  5/31- decompensated- on 3 pressors, chest x-ray reveals ARDS  Patient is currently intubated on ventilator support. OGT present; x-ray reveals tip of tube in stomach on 10/29/23.  MV: 19.5 L/min Temp (24hrs), Avg:100.5 F (38.1 C), Min:98.6 F (37 C), Max:103.4 F (39.7 C)  Propofol : 13.5 ml/hr (provides 356 kcals daily)  MAP: 70  Plan to prone pt for 16 hours per day per MD notes.   Vital High Protein currently infusing at 40 ml/hr.   Pt currently undergoing chemo for multiple myeloma; last treatment on 10/28/23.   Reviewed wt hx; pt has experienced a 3.9% wt loss over the past month, which is not significant for time frame. Per nursing notes, pt with moderate edema, which may be masking true weight loss as well as fat and muscle depletions.   Noted pt was identified with moderate malnutrition during recent hospitalization; suspect this is ongoing, however, unable to identify at this time.   Medications reviewed and include solu-cortef, protonix , nimbex ,  adrenalin , fentanyl , levophed , propofol , sodium bicarbonate , and vasopressin .  Labs reviewed: CBGS: 159.   Diet Order:   Diet Order             Diet NPO time specified  Diet effective now                   EDUCATION NEEDS:   No education needs have been identified at this time  Skin:  Skin Integrity Issues:: Stage I Stage I: rt buttocks  Last BM:  Unknown  Height:   Ht Readings from Last 1 Encounters:  10/29/23 5\' 10"  (1.778 m)    Weight:   Wt Readings from Last 1 Encounters:  10/30/23 93.7 kg    Ideal Body Weight:  75.5 kg  BMI:  Body mass index is 29.64 kg/m.  Estimated Nutritional Needs:   Kcal:  2629  Protein:  140-155 grams  Fluid:  > 2 L    Herschel Lords, RD, LDN, CDCES Registered Dietitian III Certified Diabetes Care and Education Specialist If unable to reach this RD, please use "RD Inpatient" group chat on secure chat between hours of 8am-4 pm daily

## 2023-10-30 NOTE — Procedures (Signed)
 Central Venous Catheter Insertion Procedure Note  [name]  [id number]  07/25/1945  Date:10/30/23  Time:2:06 AM   Provider Performing:Brylin Stanislawski Mason Sole   Procedure: Insertion of Non-tunneled Central Venous [phone]) with US  guidance ([id number])   Indication(s) Medication administration and Difficult access  Consent Unable to obtain consent due to emergent nature of procedure.  Anesthesia Topical only with 1% lidocaine    Timeout Verified patient identification, verified procedure, site/side was marked, verified correct patient position, special equipment/implants available, medications/allergies/relevant history reviewed, required imaging and test results available.  Sterile Technique Maximal sterile technique including full sterile barrier drape, hand hygiene, sterile gown, sterile gloves, mask, hair covering, sterile ultrasound probe cover (if used).  Procedure Description Area of catheter insertion was cleaned with chlorhexidine  and draped in sterile fashion.  With real-time ultrasound guidance a central venous catheter was placed into the right femoral vein. Nonpulsatile blood flow and easy flushing noted in all ports.  The catheter was sutured in place and sterile dressing applied.  Complications/Tolerance None; patient tolerated the procedure well. Chest X-ray is ordered to verify placement for internal jugular or subclavian cannulation.   Chest x-ray is not ordered for femoral cannulation.  EBL 0  Specimen(s) None

## 2023-10-30 NOTE — Progress Notes (Signed)
 Lactic acid levels noted  Likely in the context of septic shock and kidney injury Did receive fluid resuscitation  Will trend  Patient on multiple pressors

## 2023-10-30 NOTE — Progress Notes (Signed)
 Pt appearing to be in discomfort and needing gtt titrate at a more steady rate. Also noted that pt was desatting and hypotension so RT, Charge RN and Elink all notified of pt's acute decline. Pt was manually ventiliated by RT and pt placed on zoll. Pt was started on multiple medications for had CVC and Aline placed. Pt was started on Neuro blockade agent and then proned. Famiy was notified throughout the pt's deconditioning. They understand that the pt status is grave and were at pt's bedside intermittently during pt's decline. See MAR and various flowsheets for more details.

## 2023-10-30 NOTE — Progress Notes (Signed)
 NAME:  Daniel Whitaker, MRN:  [id number], DOB:  [birth year], LOS: 1 ADMISSION DATE:  10/29/2023, CONSULTATION DATE: 10/29/2023 REFERRING MD: Dr , CHIEF COMPLAINT: Respiratory failure  History of Present Illness:  Activated EMS with confusion, fever of 102 and increased fatigue, altered mental status  Increased cough No vomiting or diarrhea Weakness  Was recently treated for pneumonia, recent hospitalization about 2 weeks ago Recently initiated treatment for multiple myeloma -Chemotherapy received 10/28/2023 - Was treated with vancomycin  and cefepime  initially, changed to Levaquin  at discharge with a prednisone  taper - Was discharged on 4 L of oxygen  Diagnosed with multiple myeloma, IgA kappa with multiple bone fractures in 2024 Initially treated with bortezomib  and dexamethasone  Switched to carfilzomib , cyclophosphamide  and dexamethasone  Did subsequently have a respiratory decompensation requiring hospitalization recently   On home oxygen as needed  Deteriorated while in the emergency department, was on high flow nasal cannula but continued to deteriorate, requiring intubation today at about 2 PM  Transferred to [facility]  Pertinent  Medical History   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   Nocturnal leg cramps 03/11/2021   Nontoxic uninodular goiter    Obesity    Peripheral neuropathy    RLS (restless legs syndrome)    Spinal stenosis    Unspecified deficiency anemia 07/11/2013    Significant Hospital Events: Including procedures, antibiotic start and stop dates in addition to other pertinent events   5/31 decompensated-on 3 pressors epinephrine , vasopressin ,  norepinephrine .  Paralyzed 5/31 chest x-ray with ARDS picture  Interim History / Subjective:  Patient is intubated, sedated, paralyzed, prone  Family at bedside  Objective    Blood pressure (!) 92/57, pulse (!) 135, temperature (!) 101.5 F (38.6 C), resp. rate (!) 30, height 5\' 10"  (1.778 m), weight 93.7 kg, SpO2 95%.    Vent Mode: PRVC FiO2 (%):  [60 %-100 %] 100 % Set Rate:  [14 bmp-30 bmp] 30 bmp Vt Set:  [550 mL-650 mL] 650 mL PEEP:  [7.5 cmH20-22 cmH20] 22 cmH20 Plateau Pressure:  [29 cmH20-43 cmH20] 43 cmH20   Intake/Output Summary (Last 24 hours) at 10/30/2023 0716 Last data filed at 10/30/2023 2355 Gross per 24 hour  Intake 6342.32 ml  Output 210 ml  Net 6132.32 ml   Filed Weights   10/29/23 0905 10/30/23 0500  Weight: 90 kg 93.7 kg    Examination: General: In prone position HENT: ET tube in place Lungs: Fair air entry posteriorly Cardiovascular: Prone Abdomen: Prone Extremities:  Neuro: Sedated and paralyzed GU:   I reviewed nursing notes, Consultant notes, hospitalist notes, last 24 h vitals and pain scores, last 48 h intake and output, last 24 h labs and trends, and last 24 h imaging results.  Chest x-ray 531-slight improvement in bilateral infiltrate  Resolved problem list   Assessment and Plan   Multilobar pneumonia Acute on chronic respiratory failure ARDS -Continue mechanical ventilation per ARDS protocol -Target TVol 6-8cc/kgIBW -Target Plateau Pressure < 30cm H20 -Target driving pressure less than 15 cm of water  -Target PaO2 55-65: titrate PEEP/FiO2 per protocol -As long as PaO2 to FiO2 ratio is less than 1:150 position in prone position  for 16 hours a day -Ventilator associated pneumonia prevention protocol - On 100%, rate of 30, PEEP of 22, - ABG 7.30/38/155 - Nimbex  3 mcg/kg/min - Propofol  - Fentanyl  - Epoprostenol -50 ng per cake per minute inhalation-Will wean as tolerated  Healthcare associated pneumonia - Vancomycin  and Zosyn -  Tracheal aspirate with no organisms - Blood culture with no growth to date  Septic shock - On Levophed -40 mcg/min, epinephrine  6 mcg/min, vasopressin  0.04 - Stress dose steroids with Solu-Cortef 50 every 6  Multiple myeloma - Recently received chemo 10/28/2023  History of paroxysmal atrial fibrillation - On Coreg  and Eliquis  - Coreg  on hold  History of RLS - On oxycodone  and Lyrica  - On hold at present  History of obstructive sleep apnea  Protein calorie malnutrition - On tube feeds  Acute kidney injury BUN 40, creatinine 1.94 - Maintain renal perfusion - Avoid nephrotoxic medications - Continue close monitoring Hypocalcemia - Repleted  Updated spouse and grandson at bedside Goal at present is to continue aggressive support CODE STATUS was changed to DNR status ABG ordered for 10 AM  Best Practice (right click and "Reselect all SmartList Selections" daily)   Diet/type: tubefeeds DVT prophylaxis DOAC Pressure ulcer(s): N/A GI prophylaxis: PPI Lines: Central line Foley:  Yes, and it is still needed Code Status:  DNR Last date of multidisciplinary goals of care discussion [discussed with spouse and grandson at bedside]  Labs   CBC: Recent Labs  Lab 10/28/23 0949 10/29/23 0853 10/29/23 1403 10/29/23 1527 10/30/23 0038  WBC 9.0 9.6  --   --  18.1*  NEUTROABS 6.7 8.6*  --   --   --   HGB 10.3* 11.0* 11.9* 11.6* 11.4*  HCT 32.6* 35.8* 35.0* 34.0* 38.5*  MCV 88.1 90.9  --   --  94.1  PLT 247 177  --   --  299    Basic Metabolic Panel: Recent Labs  Lab 10/28/23 0949 10/29/23 0853 10/29/23 1403 10/29/23 1527 10/29/23 1810 10/30/23 0038  NA 141 139 137 138  --  139  K 4.0 4.3 5.0 4.6  --  4.0  CL 107 104  --   --   --  105  CO2 31 22  --   --   --  21*  GLUCOSE 96 135*  --   --   --  153*  BUN 23 32*  --   --   --  40*  CREATININE 0.59* 1.25*  --   --   --  1.94*  CALCIUM  8.6* 8.5*  --   --   --  6.1*  MG  --   --   --   --  1.8 1.8  PHOS  --   --    --   --  3.9  --    GFR: Estimated Creatinine Clearance (by C-G formula based on SCr of 1.94 mg/dL (H)) Male: 16.1 mL/min (A) Male: 36.7 mL/min (A) Recent Labs  Lab 10/28/23 0949 10/29/23 0853 10/29/23 1053 10/29/23 1810 10/30/23 0038  PROCALCITON  --   --   --  15.10  --   WBC 9.0 9.6  --   --  18.1*  LATICACIDVEN  --  1.3 2.4*  --   --     Liver Function Tests: Recent Labs  Lab 10/28/23 0949 10/29/23 0853 10/30/23 0038  AST 9* 14* 25  ALT 10 11 13   ALKPHOS 140* 144* 98  BILITOT 0.5 0.4 0.9  PROT 5.5* 5.4* 4.5*  ALBUMIN   3.6 3.6 2.3*   No results for input(s): "LIPASE", "AMYLASE" in the last 168 hours. No results for input(s): "AMMONIA" in the last 168 hours.  ABG    Component Value Date/Time   PHART 7.3 (L) 10/30/2023 0440   PCO2ART 38 10/30/2023 0440   PO2ART 155 (H) 10/30/2023 0440   HCO3 18.5 (L) 10/30/2023 0440   TCO2 21 (L) 10/29/2023 1527   ACIDBASEDEF 6.9 (H) 10/30/2023 0440   O2SAT 99.8 10/30/2023 0440     Coagulation Profile: Recent Labs  Lab 10/29/23 0853  INR 1.5*    Cardiac Enzymes: No results for input(s): "CKTOTAL", "CKMB", "CKMBINDEX", "TROPONINI" in the last 168 hours.  HbA1C: Hgb A1c MFr Bld  Date/Time Value Ref Range Status  10/15/2023 11:12 AM 5.2 4.8 - 5.6 % Final    Comment:    (NOTE) Pre diabetes:          5.7%-6.4%  Diabetes:              >6.4%  Glycemic control for   <7.0% adults with diabetes   03/20/2022 08:03 AM 5.1 4.8 - 5.6 % Final    Comment:    (NOTE) Pre diabetes:          5.7%-6.4%  Diabetes:              >6.4%  Glycemic control for   <7.0% adults with diabetes     CBG: Recent Labs  Lab 10/29/23 1959 10/29/23 2233 10/30/23 0314  GLUCAP 119* 96 119*    Review of Systems:   Unable to provide history  Past Medical History:  He,  has a past medical history of Arthritis, Cataract, Coronary artery disease, Erectile dysfunction, Esophageal reflux, Glaucoma, Hypertriglyceridemia, Impaired  fasting glucose, MGUS (monoclonal gammopathy of unknown significance), Neuropathy associated with MGUS (HCC) (07/11/2013), Nocturnal leg cramps (03/11/2021), Nontoxic uninodular goiter, Obesity, Peripheral neuropathy, RLS (restless legs syndrome), Spinal stenosis, and Unspecified deficiency anemia (07/11/2013).   Surgical History:   Past Surgical History:  Procedure Laterality Date   CARDIAC CATHETERIZATION     COLON RESECTION N/A 04/21/2013   Procedure: DIAGNOSTIC LAPAROSCOPY, lysis of adhesions for partial small bowel obstruction;  Surgeon: Harlee Lichtenstein, MD;  Location: [facility];  Service: General;  Laterality: N/A;   COLON SURGERY     partial SBO    COLONOSCOPY     CORONARY STENT INTERVENTION N/A 10/22/2021   Procedure: CORONARY STENT INTERVENTION;  Surgeon: Arleen Lacer, MD;  Location: [facility] INVASIVE CV LAB;  Service: Cardiovascular;  Laterality: N/A;   IR IMAGING GUIDED PORT INSERTION  09/02/2023   KNEE SURGERY     Arthroscopic   LEFT HEART CATH AND CORONARY ANGIOGRAPHY N/A 10/22/2021   Procedure: LEFT HEART CATH AND CORONARY ANGIOGRAPHY;  Surgeon: Arleen Lacer, MD;  Location: [facility] INVASIVE CV LAB;  Service: Cardiovascular;  Laterality: N/A;   SMALL INTESTINE SURGERY     Blockage     Social History:   reports that he quit smoking about 39 years ago. His smoking use included cigarettes. He has never used smokeless tobacco. He reports that he does not currently use alcohol after a past usage of about 10.0 standard drinks of alcohol per week. He reports that he does not use drugs.   Family History:  His family history includes Brain cancer in his mother; Dementia in his brother; Heart disease in his brother; Lung cancer in his father. There is no history of Neuropathy, Colon cancer, Colon polyps, Esophageal cancer, Rectal cancer, Stomach cancer,  or Sleep apnea.   Allergies Allergies  Allergen Reactions   Penicillins Hives   Duloxetine  Hcl Nausea Only and Other (See Comments)     Drowsiness, also   Atorvastatin Other (See Comments)    Myalgias    The patient is critically ill with multiple organ systems failure and requires high complexity decision making for assessment and support, frequent evaluation and titration of therapies, application of advanced monitoring technologies and extensive interpretation of multiple databases. Critical Care Time devoted to patient care services described in this note independent of APP/resident time (if applicable)  is 40 minutes.   Myer Artis MD [facility] Personal pager: See Amion If unanswered, please page CCM On-call: #[phone]

## 2023-10-31 ENCOUNTER — Inpatient Hospital Stay (HOSPITAL_COMMUNITY)

## 2023-10-31 DIAGNOSIS — R6521 Severe sepsis with septic shock: Secondary | ICD-10-CM | POA: Diagnosis not present

## 2023-10-31 DIAGNOSIS — A419 Sepsis, unspecified organism: Secondary | ICD-10-CM | POA: Diagnosis not present

## 2023-10-31 DIAGNOSIS — J189 Pneumonia, unspecified organism: Secondary | ICD-10-CM | POA: Diagnosis not present

## 2023-10-31 DIAGNOSIS — C9000 Multiple myeloma not having achieved remission: Secondary | ICD-10-CM | POA: Diagnosis not present

## 2023-10-31 LAB — BLOOD GAS, ARTERIAL
Acid-Base Excess: 2.9 mmol/L — ABNORMAL HIGH (ref 0.0–2.0)
Acid-Base Excess: 2.9 mmol/L — ABNORMAL HIGH (ref 0.0–2.0)
Acid-base deficit: 1.1 mmol/L (ref 0.0–2.0)
Acid-base deficit: 1.3 mmol/L (ref 0.0–2.0)
Bicarbonate: 23.5 mmol/L (ref 20.0–28.0)
Bicarbonate: 27.9 mmol/L (ref 20.0–28.0)
Bicarbonate: 27.9 mmol/L (ref 20.0–28.0)
Bicarbonate: 23.7 mmol/L (ref 20.0–28.0)
Drawn by: 11249
Drawn by: 270211
Drawn by: 20012
Drawn by: 11249
FIO2: 80 %
FIO2: 45 %
FIO2: 45 %
FIO2: 50 %
MECHVT: 650 mL
MECHVT: 650 mL
MECHVT: 650 mL
MECHVT: 650 mL
O2 Content: 45 L/min
O2 Content: 45 L/min
O2 Saturation: 100 %
O2 Saturation: 98.8 %
O2 Saturation: 98.8 %
O2 Saturation: 94.3 %
PEEP: 22 cmH2O
PEEP: 16 cmH2O
PEEP: 14 cmH2O
PEEP: 14 cmH2O
Patient temperature: 38.6
Patient temperature: 37.5
Patient temperature: 40.4
Patient temperature: 36.9
RATE: 30 {breaths}/min
RATE: 24 {breaths}/min
RATE: 24 {breaths}/min
RATE: 24 {breaths}/min
pCO2 arterial: 41 mmHg (ref 32–48)
pCO2 arterial: 44 mmHg (ref 32–48)
pCO2 arterial: 51 mmHg — ABNORMAL HIGH (ref 32–48)
pCO2 arterial: 40 mmHg (ref 32–48)
pH, Arterial: 7.38 (ref 7.35–7.45)
pH, Arterial: 7.41 (ref 7.35–7.45)
pH, Arterial: 7.36 (ref 7.35–7.45)
pH, Arterial: 7.38 (ref 7.35–7.45)
pO2, Arterial: 223 mmHg — ABNORMAL HIGH (ref 83–108)
pO2, Arterial: 83 mmHg (ref 83–108)
pO2, Arterial: 100 mmHg (ref 83–108)
pO2, Arterial: 62 mmHg — ABNORMAL LOW (ref 83–108)

## 2023-10-31 LAB — COMPREHENSIVE METABOLIC PANEL WITH GFR
ALT: 356 U/L — ABNORMAL HIGH (ref 0–44)
AST: 576 U/L — ABNORMAL HIGH (ref 15–41)
Albumin: 1.9 g/dL — ABNORMAL LOW (ref 3.5–5.0)
Alkaline Phosphatase: 128 U/L — ABNORMAL HIGH (ref 38–126)
Anion gap: 15 (ref 5–15)
BUN: 70 mg/dL — ABNORMAL HIGH (ref 8–23)
CO2: 25 mmol/L (ref 22–32)
Calcium: 5.3 mg/dL — CL (ref 8.9–10.3)
Chloride: 94 mmol/L — ABNORMAL LOW (ref 98–111)
Creatinine, Ser: 2.33 mg/dL — ABNORMAL HIGH (ref 0.61–1.24)
GFR, Estimated: 28 mL/min — ABNORMAL LOW (ref >60)
Glucose, Bld: 165 mg/dL — ABNORMAL HIGH (ref 70–99)
Potassium: 5.4 mmol/L — ABNORMAL HIGH (ref 3.5–5.1)
Sodium: 134 mmol/L — ABNORMAL LOW (ref 135–145)
Total Bilirubin: 1.3 mg/dL — ABNORMAL HIGH (ref 0.0–1.2)
Total Protein: 3.9 g/dL — ABNORMAL LOW (ref 6.5–8.1)

## 2023-10-31 LAB — CBC
HCT: 32.7 % — ABNORMAL LOW (ref 39.0–52.0)
Hemoglobin: 10.1 g/dL — ABNORMAL LOW (ref 13.0–17.0)
MCH: 28 pg (ref 26.0–34.0)
MCHC: 30.9 g/dL (ref 30.0–36.0)
MCV: 90.6 fL (ref 80.0–100.0)
Platelets: 106 10*3/uL — ABNORMAL LOW (ref 150–400)
RBC: 3.61 MIL/uL — ABNORMAL LOW (ref 4.22–5.81)
RDW: 18.2 % — ABNORMAL HIGH (ref 11.5–15.5)
WBC: 15.2 10*3/uL — ABNORMAL HIGH (ref 4.0–10.5)
nRBC: 0.1 % (ref 0.0–0.2)

## 2023-10-31 LAB — GLUCOSE, CAPILLARY
Glucose-Capillary: 147 mg/dL — ABNORMAL HIGH (ref 70–99)
Glucose-Capillary: 153 mg/dL — ABNORMAL HIGH (ref 70–99)
Glucose-Capillary: 163 mg/dL — ABNORMAL HIGH (ref 70–99)
Glucose-Capillary: 164 mg/dL — ABNORMAL HIGH (ref 70–99)
Glucose-Capillary: 171 mg/dL — ABNORMAL HIGH (ref 70–99)
Glucose-Capillary: 194 mg/dL — ABNORMAL HIGH (ref 70–99)
Glucose-Capillary: 88 mg/dL (ref 70–99)

## 2023-10-31 LAB — BASIC METABOLIC PANEL WITH GFR
Anion gap: 14 (ref 5–15)
BUN: 80 mg/dL — ABNORMAL HIGH (ref 8–23)
CO2: 26 mmol/L (ref 22–32)
Calcium: 5.5 mg/dL — CL (ref 8.9–10.3)
Chloride: 93 mmol/L — ABNORMAL LOW (ref 98–111)
Creatinine, Ser: 2.46 mg/dL — ABNORMAL HIGH (ref 0.61–1.24)
GFR, Estimated: 26 mL/min — ABNORMAL LOW (ref >60)
Glucose, Bld: 192 mg/dL — ABNORMAL HIGH (ref 70–99)
Potassium: 4.1 mmol/L (ref 3.5–5.1)
Sodium: 133 mmol/L — ABNORMAL LOW (ref 135–145)

## 2023-10-31 LAB — PHOSPHORUS: Phosphorus: 5.6 mg/dL — ABNORMAL HIGH (ref 2.5–4.6)

## 2023-10-31 LAB — MAGNESIUM: Magnesium: 2.2 mg/dL (ref 1.7–2.4)

## 2023-10-31 LAB — LACTIC ACID, PLASMA: Lactic Acid, Venous: 5 mmol/L (ref 0.5–1.9)

## 2023-10-31 LAB — TRIGLYCERIDES: Triglycerides: 445 mg/dL — ABNORMAL HIGH (ref <150)

## 2023-10-31 MED ORDER — CALCIUM GLUCONATE-NACL 2-0.675 GM/100ML-% IV SOLN
2.0000 g | Freq: Once | INTRAVENOUS | Status: AC
  Administered 2023-10-31: 2000 mg via INTRAVENOUS
  Filled 2023-10-31: qty 100

## 2023-10-31 MED ORDER — INSULIN ASPART 100 UNIT/ML IJ SOLN
0.0000 [IU] | INTRAMUSCULAR | Status: DC
  Administered 2023-10-31 – 2023-11-01 (×3): 3 [IU] via SUBCUTANEOUS

## 2023-10-31 MED ORDER — AMIODARONE IV BOLUS ONLY 150 MG/100ML
INTRAVENOUS | Status: AC
  Filled 2023-10-31: qty 100

## 2023-10-31 MED ORDER — INSULIN ASPART 100 UNIT/ML IJ SOLN
0.0000 [IU] | INTRAMUSCULAR | Status: DC
  Administered 2023-10-31 (×2): 4 [IU] via SUBCUTANEOUS

## 2023-10-31 MED ORDER — CARMEX CLASSIC LIP BALM EX OINT
1.0000 | TOPICAL_OINTMENT | CUTANEOUS | Status: DC | PRN
  Filled 2023-10-31: qty 10

## 2023-10-31 MED ORDER — ALBUMIN HUMAN 25 % IV SOLN
25.0000 g | Freq: Once | INTRAVENOUS | Status: AC
  Administered 2023-10-31: 25 g via INTRAVENOUS
  Filled 2023-10-31: qty 100

## 2023-10-31 MED ORDER — INSULIN REGULAR(HUMAN) IN NACL 100-0.9 UT/100ML-% IV SOLN: INTRAVENOUS | Status: DC

## 2023-10-31 MED ORDER — METOPROLOL TARTRATE 5 MG/5ML IV SOLN: 5.0000 mg | Freq: Once | INTRAVENOUS | Status: AC

## 2023-10-31 MED ORDER — METOPROLOL TARTRATE 5 MG/5ML IV SOLN
INTRAVENOUS | Status: AC
  Administered 2023-10-31: 5 mg via INTRAVENOUS
  Filled 2023-10-31: qty 5

## 2023-10-31 MED ORDER — STERILE WATER FOR INJECTION IJ SOLN: 30.0000 ng/kg/min | INTRAVENOUS | Status: DC

## 2023-10-31 MED ORDER — METOPROLOL TARTRATE 5 MG/5ML IV SOLN
5.0000 mg | Freq: Once | INTRAVENOUS | Status: AC
  Administered 2023-10-31: 5 mg via INTRAVENOUS
  Filled 2023-10-31: qty 5

## 2023-10-31 MED ORDER — FUROSEMIDE 10 MG/ML IJ SOLN
40.0000 mg | Freq: Once | INTRAMUSCULAR | Status: AC
  Administered 2023-10-31: 40 mg via INTRAVENOUS
  Filled 2023-10-31: qty 4

## 2023-10-31 MED ORDER — AMIODARONE HCL IN DEXTROSE 360-4.14 MG/200ML-% IV SOLN
60.0000 mg/h | INTRAVENOUS | Status: AC
  Administered 2023-10-31 (×2): 60 mg/h via INTRAVENOUS
  Filled 2023-10-31 (×2): qty 200

## 2023-10-31 MED ORDER — AMIODARONE LOAD VIA INFUSION
150.0000 mg | Freq: Once | INTRAVENOUS | Status: AC
  Administered 2023-10-31: 150 mg via INTRAVENOUS

## 2023-10-31 MED ORDER — INSULIN ASPART 100 UNIT/ML IJ SOLN: 0.0000 [IU] | INTRAMUSCULAR | Status: DC

## 2023-10-31 MED ORDER — AMIODARONE LOAD VIA INFUSION
150.0000 mg | Freq: Once | INTRAVENOUS | Status: DC
  Filled 2023-10-31: qty 83.34

## 2023-10-31 MED ORDER — STERILE WATER FOR INJECTION IJ SOLN
30.0000 ng/kg/min | INTRAVENOUS | Status: DC
  Administered 2023-10-31: 40 ng/kg/min via RESPIRATORY_TRACT
  Administered 2023-11-01: 30 ng/kg/min via RESPIRATORY_TRACT
  Filled 2023-10-31 (×3): qty 5

## 2023-10-31 MED ORDER — AMIODARONE HCL IN DEXTROSE 360-4.14 MG/200ML-% IV SOLN
30.0000 mg/h | INTRAVENOUS | Status: DC
  Administered 2023-11-01: 30 mg/h via INTRAVENOUS
  Filled 2023-10-31: qty 200

## 2023-10-31 NOTE — Progress Notes (Signed)
 NAME:  Daniel Whitaker, MRN:  [id number], DOB:  02-16-1946, LOS: 2 ADMISSION DATE:  10/29/2023, CONSULTATION DATE: 10/29/2023 REFERRING MD: Dr , CHIEF COMPLAINT: Respiratory failure  History of Present Illness:  Activated EMS with confusion, fever of 102 and increased fatigue, altered mental status  Increased cough No vomiting or diarrhea Weakness  Was recently treated for pneumonia, recent hospitalization about 2 weeks ago Recently initiated treatment for multiple myeloma -Chemotherapy received 10/28/2023 - Was treated with vancomycin  and cefepime  initially, changed to Levaquin  at discharge with a prednisone  taper - Was discharged on 4 L of oxygen  Diagnosed with multiple myeloma, IgA kappa with multiple bone fractures in 2024 Initially treated with bortezomib  and dexamethasone  Switched to carfilzomib , cyclophosphamide  and dexamethasone  Did subsequently have a respiratory decompensation requiring hospitalization recently  On home oxygen as needed  Deteriorated while in the emergency department, was on high flow nasal cannula but continued to deteriorate, requiring intubation today at about 2 PM  Transferred to [facility]  Pertinent  Medical History   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   Nocturnal leg cramps 03/11/2021   Nontoxic uninodular goiter    Obesity    Peripheral neuropathy    RLS (restless legs syndrome)    Spinal stenosis    Unspecified deficiency anemia 07/11/2013    Significant Hospital Events: Including procedures, antibiotic start and stop dates in addition to other pertinent events   5/31 decompensated-on 3 pressors epinephrine , vasopressin ,  norepinephrine .  Paralyzed 5/31 chest x-ray with ARDS picture 6/1 on vent, ARDS.  Off paralytic, did not need proning overnight Malfunctioning endotracheal tube-needed tube exchange  Interim History / Subjective:  Patient is intubated, sedated Needed tube exchange-persistent cuff leak Still febrile FiO2 better, PEEP decreased  Objective    Blood pressure 120/66, pulse (!) 117, temperature (!) 101.7 F (38.7 C), resp. rate (!) 30, height 5\' 10"  (1.778 m), weight 95.9 kg, SpO2 100%.    Vent Mode: PRVC FiO2 (%):  [60 %-100 %] 60 % Set Rate:  [22 bmp-30 bmp] 30 bmp Vt Set:  [650 mL] 650 mL PEEP:  [22 cmH20] 22 cmH20 Plateau Pressure:  [38 cmH20-44 cmH20] 38 cmH20   Intake/Output Summary (Last 24 hours) at 10/31/2023 0732 Last data filed at 10/31/2023 0714 Gross per 24 hour  Intake 7089.17 ml  Output 475 ml  Net 6614.17 ml   Filed Weights   10/29/23 0905 10/30/23 0500 10/31/23 0431  Weight: 90 kg 93.7 kg 95.9 kg    Examination: General: Elderly, chronically ill-appearing.  Ecchymosis around forehead HENT: Endotracheal tube in place Lungs: Rhonchi bilaterally Cardiovascular: S1-S2 appreciated Abdomen: Soft, bowel sounds appreciated Extremities: No clubbing, no edema Neuro: Sedated GU:   I reviewed nursing notes, Consultant notes, hospitalist notes, last 24 h vitals and pain scores, last 48 h intake and output, last 24 h labs and trends, and last 24 h imaging results.  Chest x-ray with ARDS pattern, endotracheal tube noted to be at 6 cm, airspace consolidation appears better  Resolved problem list   Assessment and Plan   Multilobar pneumonia Acute on chronic respiratory failure ARDS -Continue mechanical ventilation per ARDS protocol -Target  TVol 6-8cc/kgIBW -Target Plateau Pressure < 30cm H20 -Target driving pressure less than 15 cm of water  -Target PaO2 55-65: titrate PEEP/FiO2 per protocol -As long as PaO2 to FiO2 ratio is less than 1:150 position in prone position  for 16 hours a day -Ventilator associated pneumonia prevention protocol   Sedation - On propofol  5 mcg/kg/min - Fentanyl  100 mcg/h  Currently on 60%, PEEP decreased to 16, ABG 7.38/41/223/23.5 On propofol , fentanyl  Epoprostenol  at 50 ng/kg-will start weaning  Healthcare associated pneumonia - On linezolid and Zosyn -Tracheal aspirate 10/29/2023 with no growth so far - Blood cultures 10/29/2023 with no growth so far  Septic shock - On norepinephrine  at 23 mics per minute - On vasopressin  - Off epinephrine  - On stress dose steroids Solu-Cortef 50 every 6  Multiple myeloma - Recent will received chemotherapy 10/28/2023  History of paroxysmal atrial fibrillation - On Eliquis   History of RLS - Was on oxycodone  and Lyrica  at home - This is on hold at present  Past history of obstructive sleep apnea  Protein calorie malnutrition - On tube feeds  Acute kidney injury -BMP pending this morning BUN 46/creatinine 2.02-5/31 -Avoid nephrotoxic medications - Continue close monitoring  Hypocalcemia, repleted  Updated spouse and son at bedside Goal is to continue aggressive support at present CODE STATUS is DNR   Best Practice (right click and "Reselect all SmartList Selections" daily)   Diet/type: tubefeeds DVT prophylaxis DOAC Pressure ulcer(s): N/A GI prophylaxis: PPI Lines: Central line Foley:  Yes, and it is still needed Code Status:  DNR Last date of multidisciplinary goals of care discussion [discussed with spouse and son at bedside]  Labs   CBC: Recent Labs  Lab 10/28/23 [phone] 10/28/23 0949 10/29/23 0853 10/29/23 1403 10/29/23 1527 10/30/23 0038 10/30/23 0854 10/31/23 0402  WBC 9.0  --  9.6  --   --  18.1* 18.9* 15.2*  NEUTROABS 6.7  --  8.6*  --   --   --   --   --   HGB 10.3*   < > 11.0* 11.9* 11.6* 11.4* 11.2* 10.1*  HCT 32.6*  --  35.8* 35.0* 34.0* 38.5* 35.5* 32.7*  MCV 88.1  --  90.9  --   --  94.1 91.0 90.6  PLT 247  --  177  --   --  299 165  106*   < > = values in this interval not displayed.    Basic Metabolic Panel: Recent Labs  Lab 10/28/23 0949 10/29/23 0853 10/29/23 1403 10/29/23 1527 10/29/23 1810 10/30/23 0038 10/30/23 0854 10/30/23 1648 10/31/23 0402  NA 141 139 137 138  --  139 136  --   --   K 4.0 4.3 5.0 4.6  --  4.0 4.5  --   --   CL 107 104  --   --   --  105 100  --   --   CO2 31 22  --   --   --  21* 19*  --   --   GLUCOSE 96 135*  --   --   --  153* 190*  --   --   BUN 23 32*  --   --   --  40* 46*  --   --   CREATININE 0.59* 1.25*  --   --   --  1.94* 2.02*  --   --   CALCIUM  8.6* 8.5*  --   --   --  6.1* 6.7*  --   --  MG  --   --   --   --  1.8 1.8 2.1 2.1 2.2  PHOS  --   --   --   --  3.9  --  4.9* 4.4 5.6*   GFR: Estimated Creatinine Clearance (by C-G formula based on SCr of 2.02 mg/dL (H)) Male: 96.0 mL/min (A) Male: 35.6 mL/min (A) Recent Labs  Lab 10/29/23 0853 10/29/23 1053 10/29/23 1810 10/30/23 0038 10/30/23 0854 10/30/23 1105 10/30/23 1413 10/30/23 1648 10/31/23 0402  PROCALCITON  --   --  15.10  --   --   --   --   --   --   WBC 9.6  --   --  18.1* 18.9*  --   --   --  15.2*  LATICACIDVEN 1.3   < >  --   --   --  7.3* 6.5* 5.2* 5.0*   < > = values in this interval not displayed.    Liver Function Tests: Recent Labs  Lab 10/28/23 0949 10/29/23 0853 10/30/23 0038  AST 9* 14* 25  ALT 10 11 13   ALKPHOS 140* 144* 98  BILITOT 0.5 0.4 0.9  PROT 5.5* 5.4* 4.5*  ALBUMIN  3.6 3.6 2.3*   No results for input(s): "LIPASE", "AMYLASE" in the last 168 hours. No results for input(s): "AMMONIA" in the last 168 hours.  ABG    Component Value Date/Time   PHART 7.38 10/31/2023 0445   PCO2ART 41 10/31/2023 0445   PO2ART 223 (H) 10/31/2023 0445   HCO3 23.5 10/31/2023 0445   TCO2 21 (L) 10/29/2023 1527   ACIDBASEDEF 1.1 10/31/2023 0445   O2SAT 100 10/31/2023 0445     Coagulation Profile: Recent Labs  Lab 10/29/23 0853  INR 1.5*    Cardiac Enzymes: No results for  input(s): "CKTOTAL", "CKMB", "CKMBINDEX", "TROPONINI" in the last 168 hours.  HbA1C: Hgb A1c MFr Bld  Date/Time Value Ref Range Status  10/15/2023 11:12 AM 5.2 4.8 - 5.6 % Final    Comment:    (NOTE) Pre diabetes:          5.7%-6.4%  Diabetes:              >6.4%  Glycemic control for   <7.0% adults with diabetes   03/20/2022 08:03 AM 5.1 4.8 - 5.6 % Final    Comment:    (NOTE) Pre diabetes:          5.7%-6.4%  Diabetes:              >6.4%  Glycemic control for   <7.0% adults with diabetes     CBG: Recent Labs  Lab 10/30/23 1110 10/30/23 1555 10/30/23 2014 10/30/23 2359 10/31/23 0434  GLUCAP 192* 167* 86 147* 153*    Review of Systems:   Unable to provide history  Past Medical History:  He,  has a past medical history of Arthritis, Cataract, Coronary artery disease, Erectile dysfunction, Esophageal reflux, Glaucoma, Hypertriglyceridemia, Impaired fasting glucose, MGUS (monoclonal gammopathy of unknown significance), Neuropathy associated with MGUS (HCC) (07/11/2013), Nocturnal leg cramps (03/11/2021), Nontoxic uninodular goiter, Obesity, Peripheral neuropathy, RLS (restless legs syndrome), Spinal stenosis, and Unspecified deficiency anemia (07/11/2013).   Surgical History:   Past Surgical History:  Procedure Laterality Date   CARDIAC CATHETERIZATION     COLON RESECTION N/A 04/21/2013   Procedure: DIAGNOSTIC LAPAROSCOPY, lysis of adhesions for partial small bowel obstruction;  Surgeon: Harlee Lichtenstein, MD;  Location: [facility];  Service: General;  Laterality: N/A;   COLON SURGERY  partial SBO    COLONOSCOPY     CORONARY STENT INTERVENTION N/A 10/22/2021   Procedure: CORONARY STENT INTERVENTION;  Surgeon: Arleen Lacer, MD;  Location: [facility] INVASIVE CV LAB;  Service: Cardiovascular;  Laterality: N/A;   IR IMAGING GUIDED PORT INSERTION  09/02/2023   KNEE SURGERY     Arthroscopic   LEFT HEART CATH AND CORONARY ANGIOGRAPHY N/A 10/22/2021   Procedure: LEFT HEART  CATH AND CORONARY ANGIOGRAPHY;  Surgeon: Arleen Lacer, MD;  Location: [facility] INVASIVE CV LAB;  Service: Cardiovascular;  Laterality: N/A;   SMALL INTESTINE SURGERY     Blockage     Social History:   reports that he quit smoking about 39 years ago. His smoking use included cigarettes. He has never used smokeless tobacco. He reports that he does not currently use alcohol after a past usage of about 10.0 standard drinks of alcohol per week. He reports that he does not use drugs.   Family History:  His family history includes Brain cancer in his mother; Dementia in his brother; Heart disease in his brother; Lung cancer in his father. There is no history of Neuropathy, Colon cancer, Colon polyps, Esophageal cancer, Rectal cancer, Stomach cancer, or Sleep apnea.   Allergies Allergies  Allergen Reactions   Penicillins Hives   Duloxetine  Hcl Nausea Only and Other (See Comments)    Drowsiness, also   Atorvastatin Other (See Comments)    Myalgias    The patient is critically ill with multiple organ systems failure and requires high complexity decision making for assessment and support, frequent evaluation and titration of therapies, application of advanced monitoring technologies and extensive interpretation of multiple databases. Critical Care Time devoted to patient care services described in this note independent of APP/resident time (if applicable)  is 45 minutes.   Myer Artis MD [place] [facility] Personal pager: See Amion If unanswered, please page CCM On-call: #[phone]

## 2023-10-31 NOTE — Progress Notes (Signed)
   10/31/23 0944  Airway 8 mm  Placement Date/Time: 10/29/23 1410   Laryngoscope Blade: 3  ETT Types: Endobronchial  Size (mm): 8 mm  Cuffed: Cuffed  Insertion attempts: 1  Airway Equipment: Video Laryngoscope;Stylet  Placement Confirmation: Direct Visualization;ETCO2 (Capnography)...  Secured at (cm) (S)  25 cm (Advanced 2 cm per MD order, 25 @ top lip.)

## 2023-10-31 NOTE — Progress Notes (Signed)
 Checked on patient  Fever 104  Cooling blanket  being placed  Still tachycardic,  In A-fib-on amiodarone , pressor requirement improved FiO2 requirement improved   Updated family at bedside

## 2023-10-31 NOTE — Procedures (Signed)
 Intubation Procedure Note  Daniel Whitaker  [id number]  March 20, 1946  Date:10/31/23  Time:8:11 AM   Provider Performing:Aubriee Szeto A Anajulia Leyendecker    Procedure: Intubation (31500)  Indication(s) Respiratory Failure, malfunctioning endotracheal tube  Consent Cussed with family members at bedside   Anesthesia On propofol  and fentanyl    Time Out Verified patient identification, verified procedure, site/side was marked, verified correct patient position, special equipment/implants available, medications/allergies/relevant history reviewed, required imaging and test results available.   Sterile Technique Usual hand hygeine, masks, and gloves were used   Procedure Description Endotracheal tube was exchanged with aid of a glide scope and bougie Tube was successfully exchanged over the bougie with visualization with the glide scope  Complications/Tolerance None; patient tolerated the procedure well. Chest X-ray is ordered to verify placement.   EBL None   Specimen(s) None

## 2023-10-31 NOTE — Progress Notes (Addendum)
   10/31/23 1146  Epoprostenol  Administration   SpO2 98 %  FiO2 (%) 45 %  PEEP 16 cmH20  Active Nebulization  Yes   Weaned Epoprostenol  from 50 to 40 mL/kg,  Rate 5.84.

## 2023-10-31 NOTE — Progress Notes (Signed)
   10/31/23 0809  Vent Select  Invasive or Noninvasive Invasive  Adult Vent Y  Airway 8 mm  Placement Date/Time: 10/29/23 1410   Laryngoscope Blade: 3  ETT Types: Endobronchial  Size (mm): 8 mm  Cuffed: Cuffed  Insertion attempts: 1  Airway Equipment: Video Laryngoscope;Stylet  Placement Confirmation: Direct Visualization;ETCO2 (Capnography)...  Secured at (cm) (S)  23 cm (#8 ETT replaced by provider via glidescope, bougie, secure@ 23 cm.)  Measured From Lips  Secured Location Center  Secured By English as a second language teacher No  Tube Holder Repositioned Yes  Prone position No  Head position Right  Cuff Pressure (cm H2O) Green OR 18-26 CmH2O  Site Condition Dry  Adult Ventilator Settings  Vent Type Servo i  Humidity HME  Vent Mode PRVC  Vt Set 650 mL  Set Rate 30 bmp  FiO2 (%) (S)  50 % (Weaned to 50%)  I Time 0.76 Sec(s)  PEEP (S)  16 cmH20 (Weaned to 16, V/O [place].)  Adult Ventilator Measurements  Peak Airway Pressure 36 L/min  Mean Airway Pressure 24 cmH20  Plateau Pressure 27 cmH20  Resp Rate Spontaneous 0 br/min  Resp Rate Total 30 br/min  Exhaled Vt 659 mL  Measured Ve 19.6 L  I:E Ratio Measured 1:1.6  Total PEEP 16 cmH20  SpO2 100 %  Adult Ventilator Alarms  Alarms On Y  Ve High Alarm 24 L/min  Ve Low Alarm 5 L/min  Resp Rate High Alarm 38 br/min  Resp Rate Low Alarm 8  PEEP Low Alarm 15 cmH2O  Press High Alarm 55 cmH2O  T Apnea 20 sec(s)  VAP Prevention  HOB> 30 Degrees Y  Daily Weaning Assessment  Daily Assessment of Readiness to Wean Wean protocol criteria not met  Reason not met Fi02 > 40%;PEEP > 8 (EPO)  Breath Sounds  Bilateral Breath Sounds Clear;Diminished  Vent Respiratory Assessment  Level of Consciousness Responds to Pain  Suction Method  Respiratory Interventions Airway suction;Oral suction  Oral Suctioning/Secretions  Suction Type Oral  Suction Device Yankauer  Secretion Amount Small  Secretion Color White;Clear  Secretion  Consistency Thin  Suction Tolerance Tolerated fairly well  Suctioning Adverse Effects None  Airway Suctioning/Secretions  Suction Type ETT  Suction Device  Catheter  Secretion Amount Small (Sputum culture obtained per MD order, labeled, req, present, sent to lab.)  Secretion Color Pink tinged  Secretion Consistency Thin  Suction Tolerance Tolerated well  Suctioning Adverse Effects None

## 2023-10-31 NOTE — Plan of Care (Signed)
  Problem: Clinical Measurements: Goal: Respiratory complications will improve Outcome: Progressing   Problem: Clinical Measurements: Goal: Ability to maintain clinical measurements within normal limits will improve Outcome: Not Progressing Goal: Will remain free from infection Outcome: Not Progressing Goal: Diagnostic test results will improve Outcome: Not Progressing Goal: Cardiovascular complication will be avoided Outcome: Not Progressing

## 2023-10-31 NOTE — Progress Notes (Signed)
 Called in to see patient  Heart rate in the 170s  Ordered amiodarone  150 bolus  Heart rate still persistently high in the 150s 2.5 mg Lopressor given, will repeat in 5 minutes if heart rate still in the 140s  Will start on amiodarone  drip

## 2023-10-31 NOTE — Progress Notes (Signed)
 eLink Physician-Brief Progress Note Patient Name: Daniel Whitaker DOB: 04/17/1946 MRN: [id number]   Date of Service  10/31/2023  HPI/Events of Note  Patient on Q4 CBG w/ resistant SSI, BSRN concern CBGs dropped from 166 to 88 in 4 hrs. On Solucortef 50 Q6, TF @ 70.  Multiple electrolyte abnormalities with AM labs request to repeat  ABG 7.36/51/100 on AC 24/650/50%/16 PEEP  eICU Interventions  Decrease SSI to moderate dose Repeat BMP tonight eLink to be informed if with abnormal results that would need correction Discussed with BSRN     Intervention Category Intermediate Interventions: Hyperglycemia - evaluation and treatment;Electrolyte abnormality - evaluation and management  [name] 10/31/2023, 8:15 PM

## 2023-10-31 NOTE — Progress Notes (Signed)
 Labs reviewed  Calcium  level noted to be 5.3 Will order calcium  gluconate  Acute kidney injury - Maintain renal perfusion  He is 13 L positive, will give albumin  then 40 of Lasix 

## 2023-10-31 NOTE — Progress Notes (Signed)
 Advanced ETT 3 cm from 23 to 26 cm per MD order., continued cuff leak noted, pt is receiving volumes on vent, although, audible indication of cuff leak noted post change in  informed charge RN to MD.

## 2023-10-31 NOTE — Progress Notes (Signed)
 eLink Physician-Brief Progress Note Patient Name: Daniel Whitaker DOB: 09-02-1945 MRN: [id number]   Date of Service  10/31/2023  HPI/Events of Note  Critical Calcium  5.5 (was 5.3 and got 2G Calcium  Gluc @ 1520)  Corrected calcium  7.2 for albumin  of 1.9  eICU Interventions  Calcium  gluconate 2 g IVPB re-ordered     Intervention Category Intermediate Interventions: Electrolyte abnormality - evaluation and management  Turner Gains 10/31/2023, 9:37 PM

## 2023-10-31 DEATH — deceased

## 2023-11-01 DIAGNOSIS — C9000 Multiple myeloma not having achieved remission: Secondary | ICD-10-CM | POA: Diagnosis not present

## 2023-11-01 DIAGNOSIS — J189 Pneumonia, unspecified organism: Secondary | ICD-10-CM | POA: Diagnosis not present

## 2023-11-01 DIAGNOSIS — A419 Sepsis, unspecified organism: Secondary | ICD-10-CM | POA: Diagnosis not present

## 2023-11-01 DIAGNOSIS — R6521 Severe sepsis with septic shock: Secondary | ICD-10-CM | POA: Diagnosis not present

## 2023-11-01 LAB — CBC WITH DIFFERENTIAL/PLATELET
Abs Immature Granulocytes: 0.47 10*3/uL — ABNORMAL HIGH (ref 0.00–0.07)
Basophils Absolute: 0 10*3/uL (ref 0.0–0.1)
Basophils Relative: 0 %
Eosinophils Absolute: 0 10*3/uL (ref 0.0–0.5)
Eosinophils Relative: 0 %
HCT: 25 % — ABNORMAL LOW (ref 39.0–52.0)
Hemoglobin: 8.1 g/dL — ABNORMAL LOW (ref 13.0–17.0)
Immature Granulocytes: 4 %
Lymphocytes Relative: 2 %
Lymphs Abs: 0.3 10*3/uL — ABNORMAL LOW (ref 0.7–4.0)
MCH: 28.6 pg (ref 26.0–34.0)
MCHC: 32.4 g/dL (ref 30.0–36.0)
MCV: 88.3 fL (ref 80.0–100.0)
Monocytes Absolute: 0.3 10*3/uL (ref 0.1–1.0)
Monocytes Relative: 2 %
Neutro Abs: 11.1 10*3/uL — ABNORMAL HIGH (ref 1.7–7.7)
Neutrophils Relative %: 92 %
Platelets: 51 10*3/uL — ABNORMAL LOW (ref 150–400)
RBC: 2.83 MIL/uL — ABNORMAL LOW (ref 4.22–5.81)
RDW: 18.4 % — ABNORMAL HIGH (ref 11.5–15.5)
WBC: 12.1 10*3/uL — ABNORMAL HIGH (ref 4.0–10.5)
nRBC: 0 % (ref 0.0–0.2)

## 2023-11-01 LAB — CULTURE, RESPIRATORY W GRAM STAIN: Culture: NO GROWTH

## 2023-11-01 LAB — BASIC METABOLIC PANEL WITH GFR
Anion gap: 14 (ref 5–15)
BUN: 85 mg/dL — ABNORMAL HIGH (ref 8–23)
CO2: 24 mmol/L (ref 22–32)
Calcium: 5.5 mg/dL — CL (ref 8.9–10.3)
Chloride: 92 mmol/L — ABNORMAL LOW (ref 98–111)
Creatinine, Ser: 2.54 mg/dL — ABNORMAL HIGH (ref 0.61–1.24)
GFR, Estimated: 25 mL/min — ABNORMAL LOW (ref >60)
Glucose, Bld: 179 mg/dL — ABNORMAL HIGH (ref 70–99)
Potassium: 4.6 mmol/L (ref 3.5–5.1)
Sodium: 130 mmol/L — ABNORMAL LOW (ref 135–145)

## 2023-11-01 LAB — GLUCOSE, CAPILLARY
Glucose-Capillary: 178 mg/dL — ABNORMAL HIGH (ref 70–99)
Glucose-Capillary: 91 mg/dL (ref 70–99)
Glucose-Capillary: 194 mg/dL — ABNORMAL HIGH (ref 70–99)

## 2023-11-01 MED ORDER — MORPHINE BOLUS VIA INFUSION
5.0000 mg | INTRAVENOUS | Status: DC | PRN
  Administered 2023-11-01 (×2): 5 mg via INTRAVENOUS

## 2023-11-01 MED ORDER — MIDAZOLAM HCL 2 MG/2ML IJ SOLN
2.0000 mg | INTRAMUSCULAR | Status: DC | PRN
  Administered 2023-11-01: 2 mg via INTRAVENOUS
  Filled 2023-11-01: qty 4

## 2023-11-01 MED ORDER — ACETAMINOPHEN 650 MG RE SUPP: 650.0000 mg | Freq: Four times a day (QID) | RECTAL | Status: DC | PRN

## 2023-11-01 MED ORDER — POLYVINYL ALCOHOL 1.4 % OP SOLN: 1.0000 [drp] | Freq: Four times a day (QID) | OPHTHALMIC | Status: DC | PRN

## 2023-11-01 MED ORDER — MORPHINE 100MG IN NS 100ML (1MG/ML) PREMIX INFUSION
0.0000 mg/h | INTRAVENOUS | Status: DC
  Administered 2023-11-01: 5 mg/h via INTRAVENOUS
  Filled 2023-11-01: qty 100

## 2023-11-01 MED ORDER — GLYCOPYRROLATE 0.2 MG/ML IJ SOLN: 0.2000 mg | INTRAMUSCULAR | Status: DC | PRN

## 2023-11-01 MED ORDER — DIPHENHYDRAMINE HCL 50 MG/ML IJ SOLN
25.0000 mg | INTRAMUSCULAR | Status: DC | PRN
Start: 2023-11-01 — End: 2023-11-01

## 2023-11-01 MED ORDER — CALCIUM GLUCONATE-NACL 2-0.675 GM/100ML-% IV SOLN
2.0000 g | Freq: Once | INTRAVENOUS | Status: AC
  Administered 2023-11-01: 2000 mg via INTRAVENOUS
  Filled 2023-11-01: qty 100

## 2023-11-01 MED ORDER — GLYCOPYRROLATE 0.2 MG/ML IJ SOLN
0.2000 mg | INTRAMUSCULAR | Status: DC | PRN
  Administered 2023-11-01: 0.2 mg via INTRAVENOUS
  Filled 2023-11-01: qty 1

## 2023-11-01 MED ORDER — SODIUM CHLORIDE 0.9 % IV SOLN: INTRAVENOUS | Status: DC | PRN

## 2023-11-01 MED ORDER — SODIUM CHLORIDE 0.9 % IV SOLN: 2.0000 g | INTRAVENOUS | Status: DC

## 2023-11-01 MED ORDER — GLYCOPYRROLATE 1 MG PO TABS: 1.0000 mg | ORAL_TABLET | ORAL | Status: DC | PRN

## 2023-11-01 MED ORDER — ACETAMINOPHEN 325 MG PO TABS: 650.0000 mg | ORAL_TABLET | Freq: Four times a day (QID) | ORAL | Status: DC | PRN

## 2023-11-02 LAB — CULTURE, RESPIRATORY W GRAM STAIN

## 2023-11-03 LAB — CULTURE, BLOOD (ROUTINE X 2)
Culture: NO GROWTH
Culture: NO GROWTH
Special Requests: ADEQUATE

## 2023-11-04 ENCOUNTER — Other Ambulatory Visit

## 2023-11-04 ENCOUNTER — Ambulatory Visit: Admitting: Hematology and Oncology

## 2023-11-04 ENCOUNTER — Ambulatory Visit

## 2023-11-05 ENCOUNTER — Ambulatory Visit

## 2023-11-10 ENCOUNTER — Ambulatory Visit: Payer: Medicare Other | Admitting: Adult Health

## 2023-11-11 ENCOUNTER — Ambulatory Visit

## 2023-11-11 ENCOUNTER — Ambulatory Visit: Admitting: Hematology and Oncology

## 2023-11-11 ENCOUNTER — Other Ambulatory Visit

## 2023-11-12 ENCOUNTER — Ambulatory Visit

## 2023-11-15 MED FILL — Sodium Bicarbonate IV Soln 8.4%: INTRAVENOUS | Qty: 100 | Status: AC

## 2023-11-15 MED FILL — Norepinephrine Bitartrate IV Soln 1 MG/ML (Base Equivalent): INTRAVENOUS | Qty: 4 | Status: AC

## 2023-11-30 NOTE — Death Summary Note (Addendum)
 DEATH SUMMARY   Patient Details  Name: Daniel Whitaker MRN: [id number] DOB: 11-03-1945  Admission/Discharge Information   Admit Date:  11-14-2023  Date of Death: Date of Death: (P) 11-17-2023  Time of Death: Time of Death: (P) 1052  Length of Stay: 3  Referring Physician: Glena Landau, MD   Reason(s) for Hospitalization  Acute respiratory failure  Diagnoses  Preliminary cause of death: Septic shock due to Multifocal Pneumonia / ARDS  Secondary Diagnoses (including complications and co-morbidities):  Principal Problem:   HCAP (healthcare-associated pneumonia) Active Problems:   Respiratory failure (HCC)   Acute hypoxemic respiratory failure (HCC) ARDS HCAP  AKI Hyperkalemia Elevated LFTs  Lactic acidosis  Afib Anemia  Thrombocytopenia Multiple Myeloma on chemotherapy Immunocompromised host  Hyponatremia  Hyperphosphatemia  Protein calorie malnutrition OSA DNR Encounter for palliative care Goals of care discussion   Brief Hospital Course (including significant findings, care, treatment, and services provided and events leading to death)  Daniel Whitaker is a 78 y.o. year old adult who was admitted 2023/11/14 after presented to ED w shortness of breath. He was found to have bilateral PNA and was initially intended for hospitalist admission but decompensated in the ED and required intubation and ICU admission for management of septic shock due to HCAP with multisystem organ failure -- AKI elevated LFTs respiratory failure with ARDS.   He was treated aggressively with broad antibiotics, pressors, mechanical ventilation and veletri , among other interventions until 2023-11-17 at which time a decision was reached to transition to comfort care.  He was compassionately extubated and died comfortably 2023/11/17 at 10:52 with family at the bedside.     Pertinent Labs and Studies  Significant Diagnostic Studies DG CHEST PORT 1 VIEW Result Date: 10/31/2023 CLINICAL DATA:   Respiratory failure. EXAM: PORTABLE CHEST 1 VIEW COMPARISON:  Earlier same day FINDINGS: Endotracheal tube tip is 6.3 cm above the base of the carina. NG tube tip is in the stomach. Right Port-A-Cath remains in place. Bilateral consolidative airspace disease, left greater than right, similar to prior. No pleural effusion. Telemetry leads overlie the chest. IMPRESSION: 1. Endotracheal tube tip is 6.3 cm above the base of the carina. 2. Bilateral consolidative airspace disease, left greater than right, similar to prior. Electronically Signed   By: Donnal Fusi M.D.   On: 10/31/2023 08:59   DG CHEST PORT 1 VIEW Result Date: 10/31/2023 CLINICAL DATA:  Endotracheal tube placement.  Hypoxia. EXAM: PORTABLE CHEST 1 VIEW COMPARISON:  10/30/2023 FINDINGS: Endotracheal tube tip is 6.2 cm above the base of the carina. NG tube tip is in the stomach. Lungs are hyperexpanded. The bilateral upper and mid lung predominant airspace disease seen on the previous study appears improved in the interval. Right Port-A-Cath again noted. Telemetry leads overlie the chest. IMPRESSION: 1. Endotracheal tube tip is 6.2 cm above the base of the carina. 2. Improved bilateral upper and mid lung predominant airspace disease. Electronically Signed   By: Donnal Fusi M.D.   On: 10/31/2023 07:06   DG Chest Port 1 View Result Date: 10/30/2023 CLINICAL DATA:  ARDS EXAM: PORTABLE CHEST 1 VIEW COMPARISON:  11-14-2023 FINDINGS: Endotracheal tube 5.3 cm above the carina and nasogastric tube extending into the upper abdomen beyond the margin of the examination are unchanged. The lungs appear stably hyperinflated. Superimposed extensive multifocal pulmonary consolidation, most severe within the perihilar regions appears slightly improved within the upper lung zones bilaterally keeping with multifocal infection, ARDS, asymmetric alveolar pulmonary edema. No pneumothorax or definite pleural effusion. Cardiac  size within normal limits. Right internal  jugular chest port is unchanged. IMPRESSION: 1. Stable support tubes. 2. Stable pulmonary hyperinflation. 3. Slight interval improvement in multifocal pulmonary consolidation, most severe within the perihilar regions. Electronically Signed   By: Worthy Heads M.D.   On: 10/30/2023 03:39   DG CHEST PORT 1 VIEW Result Date: 10/29/2023 EXAM: 1 VIEW XRAY OF THE CHEST 10/29/2023 11:10:00 PM COMPARISON: Multiple chest radiographs earlier today and CT chest dated 10/17/2023. CLINICAL HISTORY: 200808 Hypoxia 200808. Respiratory failure, image approved by MD at bedside. FINDINGS: LUNGS AND PLEURA: Worsening bilateral multifocal airspace opacities, upper lung predominant, suggesting worsening infection/pneumonia. No pleural effusion. No pneumothorax. HEART AND MEDIASTINUM: No acute abnormality of the cardiac and mediastinal silhouettes. BONES AND SOFT TISSUES: No acute osseous abnormality. Defibrillator pads overlying the left hemithorax. LINES AND TUBES: Endotracheal tube terminates 3.5 cm above the carina. Enteric tube looped in the stomach, terminating in the gastric cardia. Right chest port terminates in the upper right atrium. IMPRESSION: 1. Worsening bilateral multifocal airspace opacities, upper lung predominant, suggesting worsening infection/pneumonia. 2. Support apparatus as above. Electronically signed by: Zadie Herter MD 10/29/2023 11:26 PM EDT RP Workstation: [id number]   DG Abd 1 View Result Date: 10/29/2023 CLINICAL DATA:  Orogastric tube EXAM: ABDOMEN - 1 VIEW COMPARISON:  CT chest 10/17/2023. FINDINGS: Nasogastric tube orogastric tube is coiled in the body of the stomach with distal tip near the fundus. There is a single dilated loop of small bowel in the upper abdomen. Bilateral parenchymal lung opacities are present. Central venous catheter tip projects over the distal SVC. IMPRESSION: Nasogastric tube orogastric tube is coiled in the body of the stomach with distal tip near the fundus.  Electronically Signed   By: Tyron Gallon M.D.   On: 10/29/2023 19:48   DG Chest Portable 1 View Result Date: 10/29/2023 CLINICAL DATA:  Intubated EXAM: PORTABLE CHEST 1 VIEW COMPARISON:  10/29/2023, 10/17/2023, 10/16/2023 FINDINGS: Again limited by positioning and habitus, overall poor quality exam which limits assessment of lines and tubes. Right-sided vascular catheter tip at the level of the right atrium. Suspect faintly visible enteric tube looped at the level of the stomach. On the second of 2 images, suspect endotracheal tube tip may be about 2 cm superior to carina, there is additional overlapping tube coursing towards right chest, probably external to patient as extends beyond the tracheal air column. Extensive bilateral lung consolidations. Bony changes corresponding to history of myeloma IMPRESSION: Very technically limited exam with poorly visible lines and tubes. Suspect that the endotracheal tube tip may be about 2 cm superior to carina, but poorly visible. Faintly visible enteric tube appears looped in the stomach. Widespread consolidations and airspace disease. Electronically Signed   By: Esmeralda Hedge M.D.   On: 10/29/2023 16:21   DG Chest Port 1 View Result Date: 10/29/2023 CLINICAL DATA:  Possible sepsis fever EXAM: PORTABLE CHEST 1 VIEW COMPARISON:  10/29/2023, CT chest 10/17/2023, PET CT 10/01/2023 FINDINGS: Limited by positioning, the patient's head and neck obscure the apices. Right-sided central venous port tip at the right atrium. Widespread bilateral airspace disease and consolidations appears slightly worse compared to prior radiograph. Enlarged cardiomediastinal silhouette. Multiple lytic lesions in the ribs and shoulders consistent with history of myeloma IMPRESSION: Widespread bilateral airspace disease and consolidations appears slightly worse compared to prior radiograph. Enlarged cardiomediastinal silhouette. Electronically Signed   By: Esmeralda Hedge M.D.   On: 10/29/2023 16:15    DG Chest Port 1 View Result Date: 10/29/2023 CLINICAL DATA:  Recent confusion, fever and fatigue. History of multiple myeloma. Clinical concern for sepsis. EXAM: PORTABLE CHEST 1 VIEW COMPARISON:  10/16/2023.  Chest CTA dated 10/17/2023. FINDINGS: Stable enlarged cardiac silhouette. Right jugular porta catheter tip in the mid right atrium, proximally 3.5 cm inferior to the superior cavoatrial junction. No significant change in a large area of dense consolidation and additional patchy airspace opacity in the left lung. Interval areas of dense consolidation with otherwise stable underlying patchy consolidation in the right lung. No pleural fluid. Extensive lytic bony metastatic disease is again demonstrated as well as moderate to marked thoracic spine degenerative changes and mild scoliosis. IMPRESSION: 1. Interval areas of dense consolidation with otherwise stable underlying patchy consolidation in the right lung. 2. No significant change in a large area of dense consolidation and additional patchy airspace opacity in the left lung. These findings remain most compatible with severe bilateral pneumonia. 3. Stable cardiomegaly. 4. Extensive lytic bony metastatic disease. Electronically Signed   By: Catherin Closs M.D.   On: 10/29/2023 10:40   CT Angio Chest Pulmonary Embolism (PE) W or WO Contrast Result Date: 10/17/2023 CLINICAL DATA:  Acute respiratory failure. EXAM: CT ANGIOGRAPHY CHEST WITH CONTRAST TECHNIQUE: Multidetector CT imaging of the chest was performed using the standard protocol during bolus administration of intravenous contrast. Multiplanar CT image reconstructions and MIPs were obtained to evaluate the vascular anatomy. RADIATION DOSE REDUCTION: This exam was performed according to the departmental dose-optimization program which includes automated exposure control, adjustment of the mA and/or kV according to patient size and/or use of iterative reconstruction technique. CONTRAST:  75mL OMNIPAQUE   IOHEXOL  350 MG/ML SOLN COMPARISON:  PET CT 10/01/2023.  CT angiogram chest 07/26/2023. FINDINGS: Cardiovascular: Satisfactory opacification of the pulmonary arteries to the segmental level. No evidence of pulmonary embolism. Normal heart size. No pericardial effusion. There are atherosclerotic calcifications of the aorta. Right chest port catheter tip ends in the right atrium. Mediastinum/Nodes: No enlarged mediastinal, hilar, or axillary lymph nodes. Thyroid  gland, trachea, and esophagus demonstrate no significant findings. Lungs/Pleura: There are new small bilateral pleural effusions. There are multifocal ground-glass opacities throughout both lungs in the upper and mid lung predominance. Additionally there are new moderate size there is airspace consolidation in the bilateral upper lobes and minimally in the superior segment of the bilateral lower lobes. There is no pneumothorax. The trachea and central airways are patent. Upper Abdomen: No acute abnormality. Musculoskeletal: Diffuse osseous metastatic disease again noted. Metastatic disease has significantly increased compared to 07/26/2023. There are new subacute/healing sternal fractures in the superior sternum and mid sternum. There are new compression deformities of T3, T4 and T6 when compared to 07/26/2023. There are numerous healed bilateral rib fractures. Review of the MIP images confirms the above findings. IMPRESSION: 1. No evidence for pulmonary embolism. 2. New small bilateral pleural effusions. 3. New multifocal ground-glass opacities throughout both lungs in the upper and mid lung predominance. Findings are nonspecific and may be infectious/inflammatory or related to edema. 4. New moderate size airspace consolidation in the bilateral upper lobes and minimally in the superior segment of the bilateral lower lobes. Findings are worrisome for multifocal pneumonia. 5. Diffuse osseous metastatic disease has significantly increased compared to 07/26/2023.  6. New compression deformities of T3, T4 and T6 and new subacute/healing sternal fractures when compared to 07/26/2023. Aortic Atherosclerosis (ICD10-I70.0). Electronically Signed   By: Tyron Gallon M.D.   On: 10/17/2023 17:39   DG Chest Port 1 View Result Date: 10/16/2023 CLINICAL DATA:  Respiratory distress.  Shortness of breath. EXAM: PORTABLE CHEST 1 VIEW COMPARISON:  10/15/2023 FINDINGS: The cardio pericardial silhouette is enlarged. Bilateral consolidative airspace disease is similar to prior compatible with pneumonia. Underlying diffuse interstitial lung disease evident. Right Port-A-Cath noted. Multiple bilateral rib fractures evident with innumerable scattered osseous lucencies compatible with reported clinical history of multiple myeloma. IMPRESSION: 1. Bilateral consolidative airspace disease compatible with pneumonia. No significant change from prior. 2. Multiple bilateral rib fractures with innumerable scattered osseous lucencies compatible with reported clinical history of multiple myeloma. Electronically Signed   By: Donnal Fusi M.D.   On: 10/16/2023 09:05   DG Chest Port 1 View Result Date: 10/15/2023 CLINICAL DATA:  Shortness of breath. EXAM: PORTABLE CHEST 1 VIEW COMPARISON:  08/29/2023 FINDINGS: Rotated film with patient's face obscuring the left apex. The cardio pericardial silhouette is enlarged. Interstitial markings are diffusely coarsened with chronic features. Patchy and confluent airspace disease is seen in both upper lungs suggesting pneumonia. Bones are diffusely demineralized with numerous bilateral rib fractures. Right Port-A-Cath again noted. Telemetry leads overlie the chest. IMPRESSION: 1. Patchy and confluent airspace disease in both upper lungs suggesting multifocal pneumonia. 2. Chronic interstitial coarsening. Electronically Signed   By: Donnal Fusi M.D.   On: 10/15/2023 07:25    Microbiology Recent Results (from the past 240 hours)  Blood Culture (routine x 2)      Status: None (Preliminary result)   Collection Time: 10/29/23  8:53 AM   Specimen: BLOOD  Result Value Ref Range Status   Specimen Description   Final    BLOOD BLOOD RIGHT FOREARM Performed at [facility], [address], [place], [place] [address]    Special Requests   Final    BOTTLES DRAWN AEROBIC AND ANAEROBIC Blood Culture adequate volume Performed at [facility], [address], [place], [place] [address]    Culture   Final    NO GROWTH 3 DAYS Performed at [facility], [address], [place], [place] [address]    Report Status PENDING  Incomplete  Resp panel by RT-PCR (RSV, Flu A&B, Covid) Anterior Nasal Swab     Status: None   Collection Time: 10/29/23  8:54 AM   Specimen: Anterior Nasal Swab  Result Value Ref Range Status   SARS Coronavirus 2 by RT PCR NEGATIVE NEGATIVE Final    Comment: (NOTE) SARS-CoV-2 target nucleic acids are NOT DETECTED.  The SARS-CoV-2 RNA is generally detectable in upper respiratory specimens during the acute phase of infection. The lowest concentration of SARS-CoV-2 viral copies this assay can detect is 138 copies/mL. A negative result does not preclude SARS-Cov-2 infection and should not be used as the sole basis for treatment or other patient management decisions. A negative result may occur with  improper specimen collection/handling, submission of specimen other than nasopharyngeal swab, presence of viral mutation(s) within the areas targeted by this assay, and inadequate number of viral copies(<138 copies/mL). A negative result must be combined with clinical observations, patient history, and epidemiological information. The expected result is Negative.  Fact Sheet for Patients:  BloggerCourse.com  Fact Sheet for Healthcare Providers:  SeriousBroker.it  This test is no t yet approved or cleared by the [place]  FDA and  has been  authorized for detection and/or diagnosis of SARS-CoV-2 by FDA under an Emergency Use Authorization (EUA). This EUA will remain  in effect (meaning this test can be used) for the duration of the COVID-19 declaration under Section 564(b)(1) of the Act, 21 U.S.C.section 360bbb-3(b)(1), unless  the authorization is terminated  or revoked sooner.       Influenza A by PCR NEGATIVE NEGATIVE Final   Influenza B by PCR NEGATIVE NEGATIVE Final    Comment: (NOTE) The Xpert Xpress SARS-CoV-2/FLU/RSV plus assay is intended as an aid in the diagnosis of influenza from Nasopharyngeal swab specimens and should not be used as a sole basis for treatment. Nasal washings and aspirates are unacceptable for Xpert Xpress SARS-CoV-2/FLU/RSV testing.  Fact Sheet for Patients: BloggerCourse.com  Fact Sheet for Healthcare Providers: SeriousBroker.it  This test is not yet approved or cleared by the [place]  FDA and has been authorized for detection and/or diagnosis of SARS-CoV-2 by FDA under an Emergency Use Authorization (EUA). This EUA will remain in effect (meaning this test can be used) for the duration of the COVID-19 declaration under Section 564(b)(1) of the Act, 21 U.S.C. section 360bbb-3(b)(1), unless the authorization is terminated or revoked.     Resp Syncytial Virus by PCR NEGATIVE NEGATIVE Final    Comment: (NOTE) Fact Sheet for Patients: BloggerCourse.com  Fact Sheet for Healthcare Providers: SeriousBroker.it  This test is not yet approved or cleared by the [place]  FDA and has been authorized for detection and/or diagnosis of SARS-CoV-2 by FDA under an Emergency Use Authorization (EUA). This EUA will remain in effect (meaning this test can be used) for the duration of the COVID-19 declaration under Section 564(b)(1) of the Act, 21 U.S.C. section 360bbb-3(b)(1), unless the  authorization is terminated or revoked.  Performed at [facility], [address] [place], [place] [address]   Blood Culture (routine x 2)     Status: None (Preliminary result)   Collection Time: 10/29/23  8:58 AM   Specimen: BLOOD LEFT HAND  Result Value Ref Range Status   Specimen Description   Final    BLOOD LEFT HAND Performed at [facility], [address], [place], [place] [address]    Special Requests   Final    BOTTLES DRAWN AEROBIC AND ANAEROBIC Blood Culture results may not be optimal due to an inadequate volume of blood received in culture bottles Performed at [facility], [address], [place], [place] [address]    Culture   Final    NO GROWTH 3 DAYS Performed at [facility], [address], [place], [place] [address]    Report Status PENDING  Incomplete  MRSA Next Gen by PCR, Nasal     Status: None   Collection Time: 10/29/23  5:47 PM   Specimen: Nasal Mucosa; Nasal Swab  Result Value Ref Range Status   MRSA by PCR Next Gen NOT DETECTED NOT DETECTED Final    Comment: (NOTE) The GeneXpert MRSA Assay (FDA approved for NASAL specimens only), is one component of a comprehensive MRSA colonization surveillance program. It is not intended to diagnose MRSA infection nor to guide or monitor treatment for MRSA infections. Test performance is not FDA approved in patients less than 65 years old. Performed at [facility], [address], [place], [place] [address]   Culture, Respiratory w Gram Stain     Status: None (Preliminary result)   Collection Time: 10/29/23  5:50 PM   Specimen: Tracheal Aspirate; Respiratory  Result Value Ref Range Status   Specimen Description   Final    TRACHEAL ASPIRATE Performed at [facility], [address] [place], [place] [address]    Special Requests   Final    NONE Performed at [facility], [address],  [place], [place] [address]    Gram Stain   Final    FEW WBC PRESENT, PREDOMINANTLY PMN NO ORGANISMS SEEN    Culture   Final    NO GROWTH 2 DAYS Performed at [facility], [address] [place], [place] [address]    Report Status PENDING  Incomplete  Culture, Respiratory w Gram Stain     Status: None (Preliminary result)   Collection Time: 10/31/23  8:17 AM   Specimen: Tracheal Aspirate; Respiratory  Result Value Ref Range Status   Specimen Description   Final    TRACHEAL ASPIRATE Performed at [facility], [address] [place], [place] [address]    Special Requests   Final    SPUTUM Performed at [facility], [address] [place], [place] [address]    Gram Stain   Final    FEW WBC SEEN FEW SQUAMOUS EPITHELIAL CELLS PRESENT FEW GRAM NEGATIVE RODS RARE GRAM POSITIVE COCCI Performed at [facility], [address] [place], [place] [address]    Culture PENDING  Incomplete   Report Status PENDING  Incomplete    Lab Basic Metabolic Panel: Recent Labs  Lab 10/29/23 1810 10/30/23 0038 10/30/23 1308 10/30/23 1648 10/31/23 0402 10/31/23 1358 10/31/23 2056 11/07/2023 0406  NA  --  139 136  --   --  134* 133* 130*  K  --  4.0 4.5  --   --  5.4* 4.1 4.6  CL  --  105 100  --   --  94* 93* 92*  CO2  --  21* 19*  --   --  25 26 24   GLUCOSE  --  153* 190*  --   --  165* 192* 179*  BUN  --  40* 46*  --   --  70* 80* 85*  CREATININE  --  1.94* 2.02*  --   --  2.33* 2.46* 2.54*  CALCIUM   --  6.1* 6.7*  --   --  5.3* 5.5* 5.5*  MG 1.8 1.8 2.1 2.1 2.2  --   --   --   PHOS 3.9  --  4.9* 4.4 5.6*  --   --   --    Liver Function Tests: Recent Labs  Lab 10/28/23 0949 10/29/23 0853 10/30/23 0038 10/31/23 1358  AST 9* 14* 25 576*  ALT 10 11 13  356*  ALKPHOS 140* 144* 98 128*  BILITOT 0.5 0.4 0.9 1.3*  PROT 5.5* 5.4* 4.5* 3.9*  ALBUMIN  3.6 3.6 2.3* 1.9*   No results for input(s): "LIPASE", "AMYLASE" in the last 168  hours. No results for input(s): "AMMONIA" in the last 168 hours. CBC: Recent Labs  Lab 10/28/23 0949 10/29/23 0853 10/29/23 1403 10/29/23 1527 10/30/23 0038 10/30/23 0854 10/31/23 0402 11/22/2023 0406  WBC 9.0 9.6  --   --  18.1* 18.9* 15.2* 12.1*  NEUTROABS 6.7 8.6*  --   --   --   --   --  11.1*  HGB 10.3* 11.0*   < > 11.6* 11.4* 11.2* 10.1* 8.1*  HCT 32.6* 35.8*   < > 34.0* 38.5* 35.5* 32.7* 25.0*  MCV 88.1 90.9  --   --  94.1 91.0 90.6 88.3  PLT 247 177  --   --  299 165 106* 51*   < > = values in this interval not displayed.   Cardiac Enzymes: No results for input(s): "CKTOTAL", "CKMB", "CKMBINDEX", "  TROPONINI" in the last 168 hours. Sepsis Labs: Recent Labs  Lab 10/29/23 1810 10/30/23 0038 10/30/23 0854 10/30/23 1105 10/30/23 1413 10/30/23 1648 10/31/23 0402 11/28/2023 0406  PROCALCITON 15.10  --   --   --   --   --   --   --   WBC  --  18.1* 18.9*  --   --   --  15.2* 12.1*  LATICACIDVEN  --   --   --  7.3* 6.5* 5.2* 5.0*  --     Procedures/Operations  5/30 ETT 5/31 CVC 5/31 arterial line  6/1 ETT exchange   Darin Edinger Nima Bamburg 11/29/2023, 11:49 AM

## 2023-11-30 NOTE — Plan of Care (Signed)
  Problem: Clinical Measurements: Goal: Ability to maintain clinical measurements within normal limits will improve Outcome: Progressing   Problem: Nutrition: Goal: Adequate nutrition will be maintained Outcome: Progressing   Problem: Pain Managment: Goal: General experience of comfort will improve and/or be controlled Outcome: Progressing   Problem: Skin Integrity: Goal: Risk for impaired skin integrity will decrease Outcome: Progressing   Problem: Fluid Volume: Goal: Ability to maintain a balanced intake and output will improve Outcome: Progressing   Problem: Metabolic: Goal: Ability to maintain appropriate glucose levels will improve Outcome: Progressing   Problem: Nutritional: Goal: Maintenance of adequate nutrition will improve Outcome: Progressing   Problem: Clinical Measurements: Goal: Diagnostic test results will improve Outcome: Not Progressing Goal: Respiratory complications will improve Outcome: Not Progressing Goal: Cardiovascular complication will be avoided Outcome: Not Progressing

## 2023-11-30 NOTE — Progress Notes (Signed)
 Daniel Whitaker   DOB:03-02-1946   ZO#:[id number]    ASSESSMENT & PLAN:  Multiple myeloma not having achieved remission (HCC) He has IgA kappa multiple myeloma with multiple bone fractures at presentation in 2024.  Bone marrow biopsy showed 15% involvement, standard risk myeloma FISH and cytogenetics  He ha initially good partial response with combination chemotherapy with daratumumab , lenalidomide , bortezomib  and dexamethasone  but then started to have progression of disease Treatment was switched to combination treatment with carfilzomib , cyclophosphamide  and dexamethasone  Recent myeloma panel was shared with the patient which show positive response to therapy  His treatment course was complicated by recurrent hospitalizations At this point in time, the patient is not a candidate to receive further chemotherapy  Recurrent pneumonia with respiratory failure Appreciate ICU care and broad-spectrum IV antibiotics If the family decides to continue on aggressive care, would consider checking IgG level and consider IVIG treatment if IgG level is low  Atrial fibrillation with rapid ventricular response  Abnormal liver function tests  Acquired pancytopenia Acute renal failure with severe electrolyte imbalance Goals of care discussion Noted change in CODE STATUS to DNR Multiple family members about the bedside and I am supportive of that decision if they decide to transition his care to comfort measures I addressed questions from multiple family members, we will follow  [name], MD 11/14/2023 9:04 AM  Subjective:  Patient well-known to me.  I saw him last Thursday for chemotherapy treatment.  By Friday morning, family noted confusion/altered mental status and was brought to the emergency department.  The patient continues to deteriorate leading to intubation and subsequent transfer to the ICU.  Events over the weekend were noted.  The patient has developed multiorgan failure including elevated  LFTs, atrial fibrillation with rapid RVR, pancytopenia, respiratory failure with hypoxia and recurrent fevers with infection At the time of my assessment, the ICU team is having discussion with family members to consider transitioning his care to comfort measures Multiple family members including his wife, his 2 children, his grandson about the bedside  Objective:  Vitals:   11/10/2023 0602 11/12/2023 0830  BP:    Pulse:    Resp:    Temp:    SpO2: 100% 99%     Intake/Output Summary (Last 24 hours) at 11/23/2023 0904 Last data filed at 11/20/2023 0723 Gross per 24 hour  Intake 4177.75 ml  Output 860 ml  Net 3317.75 ml

## 2023-11-30 NOTE — IPAL (Signed)
  Interdisciplinary Goals of Care Family Meeting   Date carried out: 11/20/2023  Location of the meeting: Bedside  Member's involved: Nurse Practitioner, Bedside Registered Nurse, and Family Member or next of kin  Durable Power of Attorney or acting medical decision maker: spouse Daniel Whitaker     Discussion: We discussed goals of care for [name] .    Family asked about transitioning to comfort care. We talked about his current critical illness course, underlying dx.  We talked about ongoing aggressive care v comfort. Present family would like to transition to comfort care, has asked that I speak w add'l family as they arrive this morning and if all are on the same page we will transition to comfort care today     Code status:   Code Status: Do not attempt resuscitation (DNR) - Comfort care   Disposition: Continue current acute care  Time spent for the meeting:    Delories Fetter, NP  11/25/2023, 10:06 AM

## 2023-11-30 NOTE — Progress Notes (Signed)
 Arterial line had good waveform, draws and flushes blood, insertion site WNL (purple hand and nail beds - RN and CCM NP aware, both hands) at this time.

## 2023-11-30 NOTE — Procedures (Signed)
 Extubation Procedure Note  Patient Details:   Name: Daniel Whitaker DOB: 04-04-1946 MRN: [id number]   Airway Documentation:    Vent end date: 11/09/2023 Vent end time: 1040   Evaluation  O2 sats: currently acceptable Complications: No apparent complications Patient did tolerate procedure well. Bilateral Breath Sounds: Clear, Diminished   No  Rosia Cook Nannette 11/16/2023, 10:42 AM   Comfort Care

## 2023-11-30 NOTE — Progress Notes (Signed)
 PT is now Comfort Care- RN is starting Morphine . Therefore, Veletri  will no longer be assessed.

## 2023-11-30 NOTE — Progress Notes (Signed)
 PHARMACY NOTE:  ANTIMICROBIAL RENAL DOSAGE ADJUSTMENT  Current antimicrobial regimen includes a mismatch between antimicrobial dosage and estimated renal function.  As per policy approved by the Pharmacy & Therapeutics and Medical Executive Committees, the antimicrobial dosage will be adjusted accordingly.  Current antimicrobial dosage:  Cefepime  2g IV q12h  Indication: Pneumonia  Renal Function:  Estimated Creatinine Clearance (by C-G formula based on SCr of 2.54 mg/dL (H)).  Male: 29.2 mL/min (A)     Antimicrobial dosage has been changed to:  Cefepime  2g IV q24h    Thank you for allowing pharmacy to be a part of this patient's care.  Kendall Pauls PharmD, BCPS [facility] main pharmacy [phone] 11/16/2023 8:23 AM

## 2023-11-30 NOTE — Progress Notes (Signed)
 eLink Physician-Brief Progress Note Patient Name: Daniel Whitaker DOB: 1945-06-22 MRN: [id number]   Date of Service  11/10/2023  HPI/Events of Note  Critical Calcium  5.5 after 2G CaGluc given  eICU Interventions  Calcium  2 g IVPB re-ordered     Intervention Category Intermediate Interventions: Electrolyte abnormality - evaluation and management  Turner Gains 11/08/2023, 5:33 AM

## 2023-11-30 NOTE — Progress Notes (Signed)
 NAME:  Daniel Whitaker, MRN:  [id number], DOB:  14-Apr-1946, LOS: 3 ADMISSION DATE:  10/29/2023, CONSULTATION DATE: 10/29/2023 REFERRING MD: Dr , CHIEF COMPLAINT: Respiratory failure  History of Present Illness:  Activated EMS with confusion, fever of 102 and increased fatigue, altered mental status  Increased cough No vomiting or diarrhea Weakness  Was recently treated for pneumonia, recent hospitalization about 2 weeks ago Recently initiated treatment for multiple myeloma -Chemotherapy received 10/28/2023 - Was treated with vancomycin  and cefepime  initially, changed to Levaquin  at discharge with a prednisone  taper - Was discharged on 4 L of oxygen  Diagnosed with multiple myeloma, IgA kappa with multiple bone fractures in 2024 Initially treated with bortezomib  and dexamethasone  Switched to carfilzomib , cyclophosphamide  and dexamethasone  Did subsequently have a respiratory decompensation requiring hospitalization recently  On home oxygen as needed  Deteriorated while in the emergency department, was on high flow nasal cannula but continued to deteriorate, requiring intubation today at about 2 PM  Transferred to [facility]  Pertinent  Medical History   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   Nocturnal leg cramps 03/11/2021   Nontoxic uninodular goiter    Obesity    Peripheral neuropathy    RLS (restless legs syndrome)    Spinal stenosis    Unspecified deficiency anemia 07/11/2013    Significant Hospital Events: Including procedures, antibiotic start and stop dates in addition to other pertinent events   5/31 decompensated-on 3 pressors epinephrine , vasopressin ,  norepinephrine .  Paralyzed 5/31 chest x-ray with ARDS picture 6/1 on vent, ARDS.  Off paralytic, did not need proning overnight Malfunctioning endotracheal tube-needed tube exchange 6/2 comfort care   Interim History / Subjective:  Spoke w family at bedside a few times this morning re their request to discuss comfort care   Objective    Blood pressure 123/68, pulse (!) 103, temperature 98.1 F (36.7 C), resp. rate (!) 27, height 5\' 10"  (1.778 m), weight 102.6 kg, SpO2 99%.    Vent Mode: PRVC FiO2 (%):  [45 %-60 %] 50 % Set Rate:  [24 bmp] 24 bmp Vt Set:  [650 mL] 650 mL PEEP:  [14 cmH20-16 cmH20] 16 cmH20 Plateau Pressure:  [25 cmH20-29 cmH20] 29 cmH20   Intake/Output Summary (Last 24 hours) at 10/31/2023 1020 Last data filed at 11/21/2023 0723 Gross per 24 hour  Intake 4177.75 ml  Output 860 ml  Net 3317.75 ml   Filed Weights   10/30/23 0500 10/31/23 0431 11/11/2023 0426  Weight: 93.7 kg 95.9 kg 102.6 kg    Examination: General: Chronically and critically ill older adult M HENT: ETT secure. Bruising on forehead  Lungs: Coarse, mechanically ventilated  Cardiovascular: rr  Abdomen: round  soft  Extremities: R hand is purple with sluggish refill. Bilat feet are white with no appreciable cap refill  Neuro: sedated GU: foley    Resolved problem list   Assessment and Plan    Goals of care discussion  Encounter for palliative Care DNR status AoC resp failure w hypoxia ARDS Multilobar PNA, HCAP Septic shock 2/2 above  AKI  Elevated LFTs  Multiple myeloma in remission, on chemo   pAF  Hyponatremia  Hypocalcemia  Anemia Thrombocytopenia  Protein calorie malnutrition  Hx OSA P -DNR  -decision reached to transition to comfort care.  -will add morphine  gtt, PRN versed .  -extubate when comfortable on PSV and when family is ready  -after extubation stop pressors  -dc non-comfort focussed interventions  -anticipate in-hospital death, likely within hours   -Appreciate Dr. Marton Sleeper speaking will family as well, they were very grateful for her presence and support during this difficult journey.    Best Practice (right click and "Reselect all SmartList Selections" daily)   Diet/type: tubefeeds DVT prophylaxis DOAC Pressure ulcer(s): N/A GI prophylaxis: PPI Lines: Central line Foley:  Yes, and it is still needed Code Status:  DNR Last date of multidisciplinary goals of care discussion [discussed with spouse and son at bedside]  Labs   CBC: Recent Labs  Lab 10/28/23 0949 10/29/23 0853 10/29/23 1403 10/29/23 1527 10/30/23 0038 10/30/23 0854 10/31/23 0402 11/03/2023 0406  WBC 9.0 9.6  --   --  18.1* 18.9* 15.2* 12.1*  NEUTROABS 6.7 8.6*  --   --   --   --   --  11.1*  HGB 10.3* 11.0*   < > 11.6* 11.4* 11.2* 10.1* 8.1*  HCT 32.6* 35.8*   < > 34.0* 38.5* 35.5* 32.7* 25.0*  MCV 88.1 90.9  --   --  94.1 91.0 90.6 88.3  PLT 247 177  --   --  299 165 106* 51*   < > = values in this interval not displayed.    Basic Metabolic Panel: Recent Labs  Lab 10/29/23 1810 10/30/23 0038 10/30/23 0854 10/30/23 1648 10/31/23 0402 10/31/23 1358 10/31/23 2056 11/05/2023 0406  NA  --  139 136  --   --  134* 133* 130*  K  --  4.0 4.5  --   --  5.4* 4.1 4.6  CL  --  105 100  --   --  94* 93* 92*  CO2  --  21* 19*  --   --  25 26 24   GLUCOSE  --  153* 190*  --   --  165* 192* 179*  BUN  --  40* 46*  --   --  70* 80* 85*  CREATININE  --  1.94* 2.02*  --   --  2.33* 2.46* 2.54*  CALCIUM   --  6.1* 6.7*  --   --  5.3* 5.5* 5.5*  MG 1.8 1.8 2.1 2.1 2.2  --   --   --   PHOS 3.9  --  4.9* 4.4 5.6*  --   --   --    GFR: Estimated Creatinine Clearance (by C-G formula based on SCr of 2.54 mg/dL (H)) Male: 24 mL/min (A) Male: 29.2 mL/min (A) Recent Labs  Lab 10/29/23 1810 10/30/23 0038 10/30/23 0854 10/30/23 1105 10/30/23 1413 10/30/23 1648 10/31/23 0402 11/23/2023 0406  PROCALCITON 15.10  --   --   --   --   --   --   --   WBC  --  18.1*  18.9*  --   --   --  15.2* 12.1*  LATICACIDVEN  --   --   --  7.3* 6.5* 5.2* 5.0*  --     Liver Function Tests: Recent Labs  Lab 10/28/23 0949 10/29/23 0853 10/30/23 0038 10/31/23 1358  AST 9* 14* 25 576*  ALT 10 11 13  356*  ALKPHOS 140* 144* 98 128*  BILITOT 0.5 0.4 0.9 1.3*  PROT 5.5* 5.4* 4.5* 3.9*  ALBUMIN  3.6 3.6 2.3* 1.9*   No results for input(s): "LIPASE", "AMYLASE" in the last 168 hours. No results for input(s): "AMMONIA" in the last 168 hours.  ABG    Component Value Date/Time   PHART 7.38 10/31/2023 1955   PCO2ART 40 10/31/2023 1955   PO2ART 62 (L) 10/31/2023 1955   HCO3 23.7 10/31/2023 1955   TCO2 21 (L) 10/29/2023 1527   ACIDBASEDEF 1.3 10/31/2023 1955   O2SAT 94.3 10/31/2023 1955     Coagulation Profile: Recent Labs  Lab 10/29/23 0853  INR 1.5*    Cardiac Enzymes: No results for input(s): "CKTOTAL", "CKMB", "CKMBINDEX", "TROPONINI" in the last 168 hours.  HbA1C: Hgb A1c MFr Bld  Date/Time Value Ref Range Status  10/15/2023 11:12 AM 5.2 4.8 - 5.6 % Final    Comment:    (NOTE) Pre diabetes:          5.7%-6.4%  Diabetes:              >6.4%  Glycemic control for   <7.0% adults with diabetes   03/20/2022 08:03 AM 5.1 4.8 - 5.6 % Final    Comment:    (NOTE) Pre diabetes:          5.7%-6.4%  Diabetes:              >6.4%  Glycemic control for   <7.0% adults with diabetes     CBG: Recent Labs  Lab 10/31/23 1955 10/31/23 2318 11/08/2023 0355 11/09/2023 0404 11/16/2023 0806  GLUCAP 88 171* 91 178* 194*    CRITICAL CARE Performed by: Delories Fetter   Total critical care time: 39 minutes  Critical care time was exclusive of separately billable procedures and treating other patients. Critical care was necessary to treat or prevent imminent or life-threatening deterioration.  Critical care was time spent personally by me on the following activities: development of treatment plan with patient and/or surrogate as well as nursing,  discussions with consultants, evaluation of patient's response to treatment, examination of patient, obtaining history from patient or surrogate, ordering and performing treatments and interventions, ordering and review of laboratory studies, ordering and review of radiographic studies, pulse oximetry and re-evaluation of patient's condition.  Eston Hence MSN, AGACNP-BC [facility] Amion for pager  11/03/2023, 10:20 AM

## 2023-11-30 DEATH — deceased

## 2024-01-11 ENCOUNTER — Ambulatory Visit (INDEPENDENT_AMBULATORY_CARE_PROVIDER_SITE_OTHER): Admitting: Audiology

## 2024-01-11 ENCOUNTER — Ambulatory Visit (INDEPENDENT_AMBULATORY_CARE_PROVIDER_SITE_OTHER): Admitting: Otolaryngology
